# Patient Record
Sex: Female | Born: 1955 | Race: Black or African American | Hispanic: No | Marital: Single | Smoking: Former smoker
Health system: Southern US, Community
[De-identification: ages and names within clinical notes are randomized; demographics above are authoritative.]

## PROBLEM LIST (undated history)

## (undated) DIAGNOSIS — I6523 Occlusion and stenosis of bilateral carotid arteries: Secondary | ICD-10-CM

## (undated) DIAGNOSIS — M25512 Pain in left shoulder: Secondary | ICD-10-CM

## (undated) DIAGNOSIS — M1990 Unspecified osteoarthritis, unspecified site: Secondary | ICD-10-CM

## (undated) DIAGNOSIS — E785 Hyperlipidemia, unspecified: Secondary | ICD-10-CM

## (undated) DIAGNOSIS — K222 Esophageal obstruction: Secondary | ICD-10-CM

## (undated) DIAGNOSIS — R519 Headache, unspecified: Secondary | ICD-10-CM

## (undated) DIAGNOSIS — R7303 Prediabetes: Secondary | ICD-10-CM

## (undated) DIAGNOSIS — I714 Abdominal aortic aneurysm, without rupture, unspecified: Secondary | ICD-10-CM

## (undated) DIAGNOSIS — H269 Unspecified cataract: Secondary | ICD-10-CM

## (undated) DIAGNOSIS — G8929 Other chronic pain: Secondary | ICD-10-CM

## (undated) DIAGNOSIS — J302 Other seasonal allergic rhinitis: Secondary | ICD-10-CM

## (undated) DIAGNOSIS — I639 Cerebral infarction, unspecified: Secondary | ICD-10-CM

## (undated) DIAGNOSIS — F419 Anxiety disorder, unspecified: Secondary | ICD-10-CM

## (undated) DIAGNOSIS — M810 Age-related osteoporosis without current pathological fracture: Secondary | ICD-10-CM

## (undated) DIAGNOSIS — K449 Diaphragmatic hernia without obstruction or gangrene: Secondary | ICD-10-CM

## (undated) DIAGNOSIS — R51 Headache: Secondary | ICD-10-CM

## (undated) DIAGNOSIS — T7840XA Allergy, unspecified, initial encounter: Secondary | ICD-10-CM

## (undated) DIAGNOSIS — I10 Essential (primary) hypertension: Secondary | ICD-10-CM

## (undated) DIAGNOSIS — K760 Fatty (change of) liver, not elsewhere classified: Secondary | ICD-10-CM

## (undated) DIAGNOSIS — R251 Tremor, unspecified: Secondary | ICD-10-CM

## (undated) DIAGNOSIS — K219 Gastro-esophageal reflux disease without esophagitis: Secondary | ICD-10-CM

## (undated) HISTORY — DX: Abdominal aortic aneurysm, without rupture: I71.4

## (undated) HISTORY — DX: Hyperlipidemia, unspecified: E78.5

## (undated) HISTORY — DX: Diaphragmatic hernia without obstruction or gangrene: K44.9

## (undated) HISTORY — DX: Tremor, unspecified: R25.1

## (undated) HISTORY — DX: Allergy, unspecified, initial encounter: T78.40XA

## (undated) HISTORY — PX: EYE SURGERY: SHX253

## (undated) HISTORY — DX: Abdominal aortic aneurysm, without rupture, unspecified: I71.40

## (undated) HISTORY — DX: Other seasonal allergic rhinitis: J30.2

## (undated) HISTORY — DX: Unspecified cataract: H26.9

## (undated) HISTORY — DX: Gastro-esophageal reflux disease without esophagitis: K21.9

## (undated) HISTORY — PX: TUBAL LIGATION: SHX77

## (undated) HISTORY — DX: Age-related osteoporosis without current pathological fracture: M81.0

## (undated) HISTORY — PX: CHOLECYSTECTOMY: SHX55

## (undated) HISTORY — DX: Anxiety disorder, unspecified: F41.9

## (undated) HISTORY — DX: Occlusion and stenosis of bilateral carotid arteries: I65.23

## (undated) HISTORY — DX: Esophageal obstruction: K22.2

## (undated) HISTORY — PX: CATARACT EXTRACTION, BILATERAL: SHX1313

## (undated) HISTORY — DX: Prediabetes: R73.03

---

## 2000-08-18 ENCOUNTER — Ambulatory Visit (HOSPITAL_COMMUNITY): Admission: RE | Admit: 2000-08-18 | Discharge: 2000-08-18 | Payer: Self-pay | Admitting: Cardiology

## 2001-02-13 ENCOUNTER — Ambulatory Visit (HOSPITAL_COMMUNITY): Admission: RE | Admit: 2001-02-13 | Discharge: 2001-02-13 | Payer: Self-pay | Admitting: Family Medicine

## 2001-02-13 ENCOUNTER — Encounter: Payer: Self-pay | Admitting: Family Medicine

## 2001-03-11 ENCOUNTER — Emergency Department (HOSPITAL_COMMUNITY): Admission: EM | Admit: 2001-03-11 | Discharge: 2001-03-11 | Payer: Self-pay | Admitting: Emergency Medicine

## 2001-03-28 ENCOUNTER — Ambulatory Visit (HOSPITAL_COMMUNITY): Admission: RE | Admit: 2001-03-28 | Discharge: 2001-03-28 | Payer: Self-pay | Admitting: Otolaryngology

## 2001-03-28 ENCOUNTER — Encounter: Payer: Self-pay | Admitting: Otolaryngology

## 2001-05-21 ENCOUNTER — Observation Stay (HOSPITAL_COMMUNITY): Admission: EM | Admit: 2001-05-21 | Discharge: 2001-05-22 | Payer: Self-pay | Admitting: *Deleted

## 2001-05-25 ENCOUNTER — Ambulatory Visit (HOSPITAL_COMMUNITY): Admission: RE | Admit: 2001-05-25 | Discharge: 2001-05-25 | Payer: Self-pay | Admitting: *Deleted

## 2001-07-02 ENCOUNTER — Emergency Department (HOSPITAL_COMMUNITY): Admission: EM | Admit: 2001-07-02 | Discharge: 2001-07-02 | Payer: Self-pay | Admitting: *Deleted

## 2001-08-28 ENCOUNTER — Emergency Department (HOSPITAL_COMMUNITY): Admission: EM | Admit: 2001-08-28 | Discharge: 2001-08-28 | Payer: Self-pay | Admitting: Emergency Medicine

## 2001-09-11 ENCOUNTER — Ambulatory Visit (HOSPITAL_COMMUNITY): Admission: RE | Admit: 2001-09-11 | Discharge: 2001-09-11 | Payer: Self-pay | Admitting: Internal Medicine

## 2001-09-11 HISTORY — PX: ESOPHAGOGASTRODUODENOSCOPY: SHX1529

## 2002-01-24 ENCOUNTER — Emergency Department (HOSPITAL_COMMUNITY): Admission: EM | Admit: 2002-01-24 | Discharge: 2002-01-24 | Payer: Self-pay | Admitting: Emergency Medicine

## 2002-04-15 ENCOUNTER — Emergency Department (HOSPITAL_COMMUNITY): Admission: EM | Admit: 2002-04-15 | Discharge: 2002-04-15 | Payer: Self-pay | Admitting: Emergency Medicine

## 2002-12-07 ENCOUNTER — Encounter: Payer: Self-pay | Admitting: Family Medicine

## 2002-12-07 ENCOUNTER — Ambulatory Visit (HOSPITAL_COMMUNITY): Admission: RE | Admit: 2002-12-07 | Discharge: 2002-12-07 | Payer: Self-pay | Admitting: Family Medicine

## 2002-12-10 ENCOUNTER — Encounter: Payer: Self-pay | Admitting: Family Medicine

## 2002-12-10 ENCOUNTER — Ambulatory Visit (HOSPITAL_COMMUNITY): Admission: RE | Admit: 2002-12-10 | Discharge: 2002-12-10 | Payer: Self-pay | Admitting: Family Medicine

## 2003-04-15 ENCOUNTER — Ambulatory Visit (HOSPITAL_COMMUNITY): Admission: RE | Admit: 2003-04-15 | Discharge: 2003-04-15 | Payer: Self-pay | Admitting: Internal Medicine

## 2003-04-29 ENCOUNTER — Emergency Department (HOSPITAL_COMMUNITY): Admission: EM | Admit: 2003-04-29 | Discharge: 2003-04-29 | Payer: Self-pay | Admitting: Emergency Medicine

## 2003-05-11 HISTORY — PX: CARDIAC CATHETERIZATION: SHX172

## 2003-06-22 ENCOUNTER — Inpatient Hospital Stay (HOSPITAL_COMMUNITY): Admission: AD | Admit: 2003-06-22 | Discharge: 2003-06-24 | Payer: Self-pay | Admitting: *Deleted

## 2003-06-22 ENCOUNTER — Encounter: Payer: Self-pay | Admitting: Emergency Medicine

## 2003-08-08 ENCOUNTER — Ambulatory Visit (HOSPITAL_COMMUNITY): Admission: RE | Admit: 2003-08-08 | Discharge: 2003-08-08 | Payer: Self-pay | Admitting: Obstetrics and Gynecology

## 2003-08-30 ENCOUNTER — Other Ambulatory Visit: Admission: RE | Admit: 2003-08-30 | Discharge: 2003-08-30 | Payer: Self-pay | Admitting: Obstetrics and Gynecology

## 2004-02-08 ENCOUNTER — Emergency Department (HOSPITAL_COMMUNITY): Admission: EM | Admit: 2004-02-08 | Discharge: 2004-02-08 | Payer: Self-pay | Admitting: Emergency Medicine

## 2004-06-16 ENCOUNTER — Ambulatory Visit (HOSPITAL_COMMUNITY): Admission: RE | Admit: 2004-06-16 | Discharge: 2004-06-16 | Payer: Self-pay | Admitting: Internal Medicine

## 2004-06-16 ENCOUNTER — Ambulatory Visit: Payer: Self-pay | Admitting: Internal Medicine

## 2004-06-16 HISTORY — PX: COLONOSCOPY: SHX174

## 2004-06-22 ENCOUNTER — Ambulatory Visit: Payer: Self-pay | Admitting: Cardiology

## 2004-06-28 ENCOUNTER — Emergency Department (HOSPITAL_COMMUNITY): Admission: EM | Admit: 2004-06-28 | Discharge: 2004-06-29 | Payer: Self-pay | Admitting: *Deleted

## 2004-09-08 ENCOUNTER — Emergency Department (HOSPITAL_COMMUNITY): Admission: EM | Admit: 2004-09-08 | Discharge: 2004-09-08 | Payer: Self-pay | Admitting: *Deleted

## 2004-10-26 ENCOUNTER — Emergency Department (HOSPITAL_COMMUNITY): Admission: EM | Admit: 2004-10-26 | Discharge: 2004-10-26 | Payer: Self-pay | Admitting: *Deleted

## 2004-10-29 ENCOUNTER — Ambulatory Visit (HOSPITAL_COMMUNITY): Admission: RE | Admit: 2004-10-29 | Discharge: 2004-10-29 | Payer: Self-pay | Admitting: Family Medicine

## 2004-10-30 ENCOUNTER — Emergency Department (HOSPITAL_COMMUNITY): Admission: EM | Admit: 2004-10-30 | Discharge: 2004-10-30 | Payer: Self-pay | Admitting: Emergency Medicine

## 2005-06-02 ENCOUNTER — Ambulatory Visit (HOSPITAL_COMMUNITY): Admission: RE | Admit: 2005-06-02 | Discharge: 2005-06-02 | Payer: Self-pay | Admitting: Internal Medicine

## 2005-09-04 ENCOUNTER — Observation Stay (HOSPITAL_COMMUNITY): Admission: EM | Admit: 2005-09-04 | Discharge: 2005-09-05 | Payer: Self-pay | Admitting: Emergency Medicine

## 2006-01-11 ENCOUNTER — Emergency Department (HOSPITAL_COMMUNITY): Admission: EM | Admit: 2006-01-11 | Discharge: 2006-01-11 | Payer: Self-pay | Admitting: Emergency Medicine

## 2007-04-26 ENCOUNTER — Ambulatory Visit (HOSPITAL_COMMUNITY): Admission: RE | Admit: 2007-04-26 | Discharge: 2007-04-26 | Payer: Self-pay | Admitting: Family Medicine

## 2008-01-03 ENCOUNTER — Ambulatory Visit (HOSPITAL_COMMUNITY): Admission: RE | Admit: 2008-01-03 | Discharge: 2008-01-03 | Payer: Self-pay | Admitting: Family Medicine

## 2008-05-24 ENCOUNTER — Ambulatory Visit (HOSPITAL_COMMUNITY): Admission: RE | Admit: 2008-05-24 | Discharge: 2008-05-24 | Payer: Self-pay | Admitting: Internal Medicine

## 2010-05-09 ENCOUNTER — Emergency Department (HOSPITAL_COMMUNITY)
Admission: EM | Admit: 2010-05-09 | Discharge: 2010-05-09 | Payer: Self-pay | Source: Home / Self Care | Admitting: Emergency Medicine

## 2010-07-20 LAB — URINALYSIS, ROUTINE W REFLEX MICROSCOPIC
Bilirubin Urine: NEGATIVE
Glucose, UA: NEGATIVE mg/dL
Ketones, ur: NEGATIVE mg/dL
Nitrite: NEGATIVE
Protein, ur: NEGATIVE mg/dL
Specific Gravity, Urine: <1.005 — ABNORMAL LOW (ref 1.005–1.030)
Urobilinogen, UA: 0.2 mg/dL (ref 0.0–1.0)
pH: 6 (ref 5.0–8.0)

## 2010-07-20 LAB — URINE MICROSCOPIC-ADD ON

## 2010-07-20 LAB — URINE CULTURE
Colony Count: >=100000
Culture  Setup Time: 201112311954

## 2010-09-25 NOTE — H&P (Signed)
[facility]  Patient:    [name], [name] Visit Number: [id number] MRN: [id number]          Service Type: OUT Location: RAD Attending Physician:  Titus Mould Dictated by:   Karleen Hampshire, M.D. Admit Date:  03/28/2001 Discharge Date: 03/28/2001                           History and Physical  CHIEF COMPLAINT:  Chest pain.  HISTORY OF PRESENT ILLNESS:  This is a 55 year old black female who presented to the emergency room on the morning of admission after working all night, complaining of some pain in her left anterior chest and also in the left shoulder blade.  The patient states she had the pain off and on for approximately 12 hours.  In the ER cardiac enzymes were negative, and EKG looked normal.  There is a strong family history of heart disease, both parents died of heart disease, she has hypercholesterolemia and severe hypertension, and she smokes.  The patient states she has a normal stress test by Dr. Juanito Doom in August 2001.  The patient also states she felt somewhat nauseated and felt like she was going to break out in a sweat associated with the pain.  She is admitted for rule out MI.  ALLERGIES:  She is allergic to no medications.  MEDICATIONS:   She currently takes: 1. Accupril 40 mg, 2 daily. 2. Ziac 2.5 daily. 3. Clonidine 0.2 b.i.d. 4. Zocor 20 mg daily. 5. Aspirin. 6. Aciphex. 7. Xanax.  PAST MEDICAL HISTORY: 1. Status post cholecystectomy. 2. History of GERD.  REVIEW OF SYSTEMS:  Noncontributory.  PHYSICAL EXAMINATION:  GENERAL:  Well-developed black female in no severe distress.  VITAL SIGNS:  Afebrile, pulse 70 and regular, respirations 20 and unlabored, blood pressure 160/75.  HEENT:  TMs are normal.  Pupils are equal to light and accommodation. Oropharynx is benign.  NECK:  Supple.  Without JVD, bruit, or thyromegaly.  LUNGS:  Clear in all areas.  HEART:  Regular sinus rhythm.  Without murmur,  gallop, or rub.  ABDOMEN:  Soft and nontender.  EXTREMITIES:  Without clubbing, cyanosis, or edema.  NEUROLOGIC:  Grossly intact.  ASSESSMENT: 1. Chest pain with multiple risk factors, rule out myocardial infarction. 2. History of hypertension. 3. History of gastroesophageal reflux disease. 4. Status post cholecystectomy. 5. Hyperlipidemia. 6. Family history of heart disease. 7. Smoker. Dictated by:   Karleen Hampshire, M.D. Attending Physician:  Titus Mould DD:  05/21/01 TD:  05/21/01 Job: [id number]

## 2010-09-25 NOTE — Cardiovascular Report (Signed)
NAME:  [name], [name]                      ACCOUNT NO.:  [id number]   MEDICAL RECORD NO.:  [id number]                   PATIENT TYPE:  INP   LOCATION:  2040                                 FACILITY:  [facility]   PHYSICIAN:  Nanetta Batty, M.D.                DATE OF BIRTH:  1956/05/03   DATE OF PROCEDURE:  06/24/2003  DATE OF DISCHARGE:                              CARDIAC CATHETERIZATION   INDICATIONS:  [name] is a 55 year old black female with positive risk  factors transferred from [facility] on February 12 because of  unstable angina.  She ruled out for myocardial infarction.  She had no acute  EKG changes.  She was treated with Lovenox.  She was cathed May 25, 2001  and was found to have clean coronaries.  She presents now for diagnostic  coronary arteriography.   PROCEDURE DESCRIPTION:  The patient was brought to the second floor [facility] in the postabsorptive state.  She was not  premedication.  Her right groin was prepped and shaved in the usual sterile  fashion.  1% Xylocaine was used for local anesthesia.  A 6 French sheath was  inserted into the right femoral artery using standard Seldinger technique. A  6 French right and left Judkins diagnostic catheter as well as 6 French  pigtail catheter were used for selective coronary angiography and left  ventriculography respectively.  A 6 French JL-3.0 guide catheter was used to  intubate the left coronary.  Omnipaque dye was used for the entirety of the  case. Retrograde aortic, ventricular pullback pressures were recorded.   HEMODYNAMICS:  1. Aortic systolic pressure 179, diastolic pressure 65.  2. Left ventricular systolic pressure 174, end-diastolic pressure 21.   SELECTIVE CORONARY ANGIOGRAPHY:  1. Left main normal.  2. LAD normal.  3. Left circumflex normal.  4. Right coronary is dominant and normal.   LEFT VENTRICULOGRAPHY:  RAO left ventriculogram was performed  using 25 mL of  Omnipaque dye at 12 mL per second.  The overall LVEF is estimated at greater  than 60% without focal wall motion abnormalities.   IMPRESSION:  [name] has essentially normal coronary arteries and  normal left ventricular function.  She will be treated empirically with anti-  reflux measures, discharge home later today and will be seen back by Dr.  Nobie Putnam for followup.  Sheaths were pulled and pressure was held to the  groin to achieve hemostasis.                                               Nanetta Batty, M.D.    JB/MEDQ  D:  06/24/2003  T:  06/24/2003  Job:  [id number]   cc:   [facility]   Patrica Duel, M.D.  [address], [address]  [place]  [place] [address]  Fax: [phone]

## 2010-09-25 NOTE — Discharge Summary (Signed)
[name], ROMBERGER            ACCOUNT NO.:  [id number]   MEDICAL RECORD NO.:  [id number]          PATIENT TYPE:  INP   LOCATION:  5506                         FACILITY:  [facility]   PHYSICIAN:  Deirdre Peer. Polite, M.D. DATE OF BIRTH:  09-12-1955   DATE OF ADMISSION:  09/04/2005  DATE OF DISCHARGE:  09/05/2005                                 DISCHARGE SUMMARY   DISCHARGE DIAGNOSES:  1.  Newly diagnosed diabetes, hemoglobin A1c 10.8.  Discharged on glyburide      5 b.i.d. as well as Lantus 16 units daily.  2.  Steroid use for unknown reason.  Patient to follow up with primary      physician on September 06, 2005, for further information regarding the use      of steroids.  3.  Hypertension, well controlled.  4.  High cholesterol, well controlled.  Total cholesterol 205, LDL 108, HDL      69.  5.  Diarrhea, unknown etiology, Clostridium difficile pending.  Currently      appears to be resolved.  Patient is afebrile with normal abdominal      series and desiring to be discharged home.  6.  Confusional state, resolved secondary to hyperglycemia.  7.  Gastroesophageal reflux disease, stable.   DISCHARGE MEDICATIONS:  1.  Lantus 16 units daily.  2.  Glyburide 5 mg q.12h.  3.  Patient is asked to decrease prednisone to 10 mg two times a day and      follow up with her primary M.D. for further titrations of that      medication and to these worsening diagnosis symptoms.  4.  Patient is also asked to hold hydrochlorothiazide.  5.  Clonidine 0.2 mg p.o. t.i.d.  6.  Bisoprolol 25 mg daily.  7.  Quinapril 40 mg daily.  8.  Fexofenadine 180 mg daily.  9.  Nexium 40 mg b.i.d.  10. Alprazolam 1 mg p.o. p.r.n.  11. Vytorin 10/20 daily.  12. Aspirin 81 mg daily.   DISPOSITION:  Patient is being discharge home in stable condition, asked to  follow up with primary M.D. in one day.   CONSULTATIONS:  None.   STUDIES:  Patient  had abdominal series within normal limits.   Discharge BMET with  potassium 3.3.  Thyroidal studies within normal limits.  Cortisol level 15. Hemoglobin A1c 10.8.  Total cholesterol 205, LDL 103, HDL  69.   Chest x-ray with no apparent disease.   HISTORY OF PRESENT ILLNESS:  A 55 year old female with the above medical  problems presented to the ED with complaints of blurred vision, polyuria,  polydipsia.  In the ED, the patient was evaluated and found to be  hyperglycemic.  Admission was deemed necessary for further treatment of new  onset diabetes.  Of note, the patient had been on prednisone for at least a  month and she is not exactly certain what she is taking this for.  This is  more than likely the cause of her diabetes to be worsened.  As stated,  admission deemed necessary for further evaluation and treatment.  Please see  dictated  H&P for further details.   PAST MEDICAL HISTORY:  As stated above.   MEDICATIONS ON ADMISSION:  As stated above and per admission H&P.   SOCIAL HISTORY:  Negative for tobacco, quit about a year ago.  No alcohol.  No drugs.   FAMILY HISTORY:  Per admission H&P.   HOSPITAL COURSE:  Patient admitted to a medicine floor bed for evaluation,  treatment of new onset diabetes.  Patient was initially treated with IV  insulin and IV fluids.  Patient had screening labs.  Thyroid studies within  normal limits, A1c and lipid panel as stated above.  Patient's sugars  trended to normal range.  Glucommander was stopped.  Patient was continued  on IV fluids as above but she needed more IV fluids up front as compared to  ____.  Patient did not have any acidosis nor azotemia.  Patient did have  some hypokalemia which has been repleted.  _____ diabetes  no complications.  Patient stated she was borderline, however, A1c is 10.8, suggesting that the  patient has been diabetic for some time.  More than likely, the initiation  of steroidal treatment for unknown cause is exacerbating her symptoms. The  patient is asked to discuss with  her primary MD now to proceed with her  prednisone as it is worsening her diabetes. At this time, I have explained  to the patient that she definitively has diabetes with A1c of 10.8, will  need Lantus as well as oral medication which may need to be further titrated  as an outpatient basis.  She has been informed to follow up with her primary  MD on September 06, 2005, for further instructions.   STEROID USE, UNKNOWN REASON WHY:  Patient has been asked to contact her  primary MD _____ .  Patient will continue on a decreased dose and asked to  follow up with her primary MD on September 06, 2005, for further advise.   NAUSEA AND VOMITING X1:  Rule out colonic gastroparesis.  Abdominal series  has been ordered which was within normal limits.  Patient is without any  symptoms at this time.  Patient also had diarrhea which appears to have  resolved. C. difficile has been ordered.  Results are pending.  However,  patient requests to be discharged to home with further outpatient follow-up.   Patient has several other chronic medical problems which are currently  stable at this time; therefore is thought to be stable for discharge.   LOW GRADE TEMPERATURE:  Resolved.  No infectious etiology has been  identified at this time.  Has been followed up clinically. Patient also had  a bout of leukocytosis which has been resolved.  Could be _____ diagnosed  diabetes.  Please note patient's chest x-ray is negative and abdominal  series is negative.  Blood culture have been  ordered but are pending.  Patient has history of sinus problems, on  medications for that but did not have any infection at this time, however,  _____ primary MD in the a.m. for further medical management.  Patient is  felt to be stable for discharge.      Deirdre Peer. Polite, M.D.  Electronically Signed     RDP/MEDQ  D:  09/05/2005  T:  09/06/2005  Job:  [id number]

## 2010-09-25 NOTE — Cardiovascular Report (Signed)
[place]  Patient:    [name], [name] Visit Number: [id number] MRN: [id number]          Service Type: CAT Location: [facility] 2864 [id number] Attending Physician:  Veneda Melter Dictated by:   Veneda Melter, M.D. [facility] Proc. Date: 05/25/01 Admit Date:  05/25/2001   CC:         [name], M.D. [facility]  Jonell Cluck, M.D.   Cardiac Catheterization  PROCEDURES PERFORMED: 1. Left heart catheterization. 2. Left ventriculogram. 3. Selective coronary angiography. 4. Perclose, right femoral artery.  DIAGNOSES: 1. Mild coronary artery disease by angiogram. 2. Normal left ventricular systolic function.  HISTORY:  [name] is a 55 year old black female who presents with substernal chest discomfort.  The patient has undergone stress imaging study over one year ago which suggested no ischemia.  Unfortunately, she has persisted in having chest discomfort and she presents now for cardiac catheterization.  DESCRIPTION OF PROCEDURE:  Informed consent was obtained.  The patient was brought to the catheterization lab.  A #6 French sheath was placed in the right femoral artery.  Left heart catheterization, selective coronary angiography, and left ventriculogram were then performed using preformed #6 French Judkins catheters.  A JL3.5 catheter was used to engage the left coronary artery.  At the termination of the case, a Perclose suture closure device was deployed to the right femoral artery.  Adequate hemostasis was achieved and the patient was transferred to the holding area in stable condition.  Findings are as follows:  FINDINGS: 1. Left main trunk:  Mild irregularities. 2. LAD:  This is a medium caliber vessel that provides three small diagonal    branches.  There are mild irregularities of not greater than 20% in the mid    LAD. 3. Left circumflex artery:  This is a small caliber vessel that provides three    small marginal branches.  There are  mild irregularities in the left    circumflex system. 4. Right coronary artery:  Dominant.  This is a small caliber vessel that    provides a small posterior descending artery and three small posterior    ventricular branches in its terminal segment.  There is mild disease of    30% in the distal branch vessels.  No critical stenosis noted.  LV:  Normal end systolic and end diastolic dimensions.  Overall left ventricular function is well preserved.  Ejection fraction of greater than 55%.  No mitral regurgitation.  LV pressure 135/0.  AO 135/60.  LVEDP equals 15.  ASSESSMENT AND PLAN:  [name] is a 55 year old female with mild coronary artery disease and well-preserved LV function.  Continued risk factor modification will be pursued and other causes of chest pain investigated. Dictated by:   Veneda Melter, M.D. [facility] Attending Physician:  Veneda Melter DD:  05/25/01 TD:  05/25/01 Job: [id number]

## 2010-09-25 NOTE — Discharge Summary (Signed)
NAME:  [name], [name]                      ACCOUNT NO.:  [id number]   MEDICAL RECORD NO.:  [id number]                   PATIENT TYPE:  INP   LOCATION:  2040                                 FACILITY:  [facility]   PHYSICIAN:  Darlin Priestly, M.D.             DATE OF BIRTH:  03-28-1956   DATE OF ADMISSION:  DATE OF DISCHARGE:  06/24/2003                                 DISCHARGE SUMMARY   DISCHARGE DIAGNOSES:  1. Chest pain, rule out for myocardial infarction by serial enzymes.  2. Status post cardiac catheterization with clear findings.  3. Possible gastroesophageal reflux disease responsible for the etiology of     bilateral chest pain.  4. Hypertension.  5. Hyperlipidemia.  6. Tobacco use.  7. Positive family history of coronary artery disease.   HOSPITAL COURSE:  This is a 55 year old African American female with a prior  history of hyperlipidemia, hypertension, positive family history of coronary  artery disease and ongoing tobacco use who presented to the emergency room  of [facility] with complaints of substernal chest pain, __________  quality, onset of pain in the evening of June 21, 2003.  The pain was  off and on and radiated to the left neck and left arm and also accompanied  by shortness of breath.  The patient was seen and evaluated at the [facility] and transferred to [facility] for in depth cardiology  evaluation.   She was admitted to [facility] telemetry to rule out myocardial  infarction protocol, on IV nitroglycerin and heparin.   HOSPITAL PROCEDURES:  Cardiac catheterization performed by Dr. Allyson Sabal on  July 04, 2003.  This catheterization revealed normal coronaries and  normal left ventricular function. The plan was to discharge the patient home  today after bed rest expires and if she is stable post ambulation, with  follow up with her primary physician to address chest pain, likely related  to gastroesophageal  reflux disease.   During this admission, she was noticed to have low serum potassium which was  repleted.   The patient tolerated the procedure well, and was transferred back to the  unit in stable condition.   LABORATORY DATA:  EKG did not show any acute changes.  CBC showed a  hemoglobin of 14, hematocrit 41.6.  White blood cell count 8.5.  Platelets  276,000.  Coagulations studies were normal.  INR 0.9.  Prothrombin time  12.7.  Sodium 137, potassium 3.3 which was repleted.  Chloride 102, cO2 29,  BUN 10, creatinine 0.9.  Glucose 103.   Lipid profile showed total cholesterol 161, triglycerides  111, HDL 46, and  LDL 83.   The patient was encouraged to quit smoking, we also increased her Ziac dose  from 5 to 10 mg daily, and this was due to her elevated blood pressure.   RECOMMENDATIONS:  She is to follow up with Dr. Nobie Putnam as an outpatient to  address her symptoms, her chest discomfort probably related to  gastroesophageal reflux disease and possibly consider EGD evaluation to rule  out peptic ulcer.   DISCHARGE ACTIVITIES:  1. No lifting greater than 5 pounds, no strenuous activity, no driving for     three days post catheterization.  2. Note to excuse the patient from work from February 15 through June 28, 2003, was given to the patient.   DISCHARGE DIET:  Low-fat, low-cholesterol diet.   DISCHARGE MEDICATIONS:  1. Crestor 40 mg daily.  2. Aspirin 81 mg daily.  3. Clonidine 0.2 mg b.i.d.  4. Aspirin 81 mg daily.  5. Quinapril 40 mg daily.  6. Nexium 40 mg daily.  7. Hydrochlorothiazide 12.5 mg daily.  8. Ziac, 10/6.25 mg daily.   FOLLOWUP:  The patient is to follow up with Dr. Patrica Duel as an  outpatient.      Raymon Mutton, P.A.                    Darlin Priestly, M.D.    MK/MEDQ  D:  06/24/2003  T:  06/24/2003  Job:  [id number]   cc:   Patrica Duel, M.D.  [address], [address]  [place]  [place] [address]  Fax: [phone]   Darlin Priestly, M.D.  [phone] [address]  [place]  [place] [address]  Fax: [phone]

## 2010-09-25 NOTE — Op Note (Signed)
[facility]  Patient:    JAXON, MYNHIER Visit Number: [id number] MRN: [id number]          Service Type: DSU Location: DAY Attending Physician:  Jonathon Bellows Dictated by:   Roetta Sessions, M.D. Proc. Date: 09/09/01 Admit Date:  09/11/2001 Discharge Date: 09/11/2001   CC:         Jonell Cluck, M.D.   Operative Report  PROCEDURE:  Esophagogastroduodenoscopy with [facility] dilation.  INDICATIONS FOR PROCEDURE:  The patient is a 54 year old lady with chronic GERD and several month history of esophageal dysphagia to solids. EGD is now being done to further evaluate her symptoms. This approach has been discussed with Ms. Sachdev at the bedside. Potential risks, benefits, and alternatives have been reviewed. Please see my handwritten H&P for more information.  MONITORING:  O2 saturations, blood pressure, pulse and respirations monitored throughout the entire procedure.  CONSCIOUS SEDATION:  Versed 5 mg IV, Demerol 100 mg IV in divided doses. Cetacaine spray for topical oropharyngeal anesthesia.  INSTRUMENT:  Olympus video chip gastroscope.  FINDINGS:  Examination of the tubular esophagus revealed a distal esophageal ring . The esophageal mucosa otherwise appeared normal. The EG was easily traversed.  STOMACH:  The gastric cavity was empty and insufflated well with air. A thorough examination of the gastric mucosa including a retroflexed view of the proximal stomach and esophagogastric junction demonstrated no abnormalities. The pylorus was patent and easily traversed.  DUODENUM:  The bulb and second portion appeared normal.  THERAPEUTIC/DIAGNOSTIC MANEUVERS PERFORMED:  A 56 French Maloney dilator was passed to full insertion with good patient tolerance. A look back revealed some blood at the EG junction. The ring appeared to have been ruptured without apparent complications.  The patient tolerated the procedure very well and was  reactive in endoscopy.  IMPRESSION:  1. Schatzkis ring otherwise normal esophagus.  2. Normal stomach.  3. Normal D1 and D2 status post passage of a 56 [place] Maloney dilator.  RECOMMENDATIONS:  1. Aciphex 20 mg orally daily for gastroesophageal reflux disease     ______ reflux measures emphasized.  2. Follow-up with Dr. Nobie Putnam.  3. Should she have any future problems with swallowing, she is to let me     know. Dictated by:   Roetta Sessions, M.D. Attending Physician:  Jonathon Bellows DD:  09/11/01 TD:  09/12/01 Job: [id number]

## 2010-09-25 NOTE — Op Note (Signed)
NAME:  [name], [name]            ACCOUNT NO.:  [id number]   MEDICAL RECORD NO.:  [id number]          PATIENT TYPE:  AMB   LOCATION:  DAY                           FACILITY:  [facility]   PHYSICIAN:  R. Roetta Sessions, M.D. DATE OF BIRTH:  10-Jul-1955   DATE OF PROCEDURE:  06/16/2004  DATE OF DISCHARGE:                                 OPERATIVE REPORT   REFERRING PHYSICIAN:  Patrica Duel, M.D.   PROCEDURES:  Colonoscopy with biopsy.   INDICATIONS FOR PROCEDURE:  The patient is a 55 year old lady who comes for  colorectal cancer screening.  She is referred by Dr. Nobie Putnam.  She is  devoid of _any  gi symptoms.  No family history of colorectal neoplasia.  She has never had  a colonoscopy.  Colonoscopy is now being done as a screening maneuver.  The  approach has been discussed with the patient at length.  The potential  risks, benefits and alternatives have been reviewed and questions answered.  She is agreeable.  Please see documentation in the medical record.   PROCEDURE NOTE:  O2 saturation, blood pressure, pulse and respirations were  monitored throughout the entire procedure.  Conscious sedation with Versed 4  mg IV and Demerol 100 mg IV in divided doses.   INSTRUMENT:  Olympus video chip system.   FINDINGS:  Digital rectal exam revealed no abnormalities.  Endoscopic prep  was adequate.   Rectum:  Examination of the rectal mucosa, including retroflexed view of the  anal verge revealed no abnormalities.   Colon:  The colonic mucosa was surveyed from the rectosigmoid junction  through the left transverse and right colon to the appendiceal orifices,  ileocecal valve and cecum.  These structures were well seen and photographed  for the record.  From this level, the scope was slowly withdrawn.  All  previously mentioned mucosal surfaces were again seen.  The patient had a  diminutive 3 mm polyp at the base of the cecum, which was cold  biopsied/removed.  There was a second similar  sized polyp at the splenic  flexure which was dealt with in the same manner.  The remainder of the  colonic mucosa appeared normal.  The patient tolerated the procedure well  and was reactive after endoscopy.   IMPRESSION:  1.  Normal rectum.  2.  Diminutive polyps of the splenic flexure and the cecum, cold      biopsied/removed.  3.  The remainder of the colonic mucosa appeared normal.   RECOMMENDATIONS:  1.  Follow up on pathology.  2.  Further recommendations to follow.      RMR/MEDQ  D:  06/16/2004  T:  06/16/2004  Job:  [id number]   cc:   Patrica Duel, M.D.  [address], [address]  [place]  [place] [address]  Fax: [phone]

## 2010-09-25 NOTE — H&P (Signed)
NAMECHRISTAL, LAGERSTROM            ACCOUNT NO.:  [id number]   MEDICAL RECORD NO.:  [id number]          PATIENT TYPE:  INP   LOCATION:  5506                         FACILITY:  [facility]   PHYSICIAN:  Deirdre Peer. Polite, M.D. DATE OF BIRTH:  09-Apr-1956   DATE OF ADMISSION:  09/04/2005  DATE OF DISCHARGE:                                HISTORY & PHYSICAL   CHIEF COMPLAINT:  Blurred vision.   PRIMARY CARE PHYSICIAN:  Patrica Duel, M.D.   HISTORY OF THE PRESENT ILLNESS:  The patient is a 55 year old African-  American female with a past medical history of hypertension, cholesterol and  GERD, and possibly esophageal dysmotility who presents with a two-week  history of blurred vision, headaches and muscle cramps.  The patient  evidently had been started one month ago on prednisone for an unclear  diagnosis at this time, but she states it is for her glands, and I suspect  it is related to her thyroid.  The patient can give me no further  information related to why Dr. Patrecia Pace may have started her on prednisone.  The patient states that two weeks ago she developed the onset of blurring  vision, polyuria and polydipsia.  She has a doctor's appointment, but it is  not until late next week or the following week.   The patient came to the emergency room today when she developed tingling in  her fingers, worsening blurred vision and muscle cramping.  She also had an  occasional onset of dyspnea, which has been intermittent.   REVIEW OF SYSTEMS:  At this time she is not short of breath.  She denies  chest pain.  She recently has been having continued runny nose and seems to  have a chronic upper respiratory tract infection.  She denies dysuria, chest  pain, shortness of breath, PND, or orthopnea.  The remainder of the review  of systems is negative.   PAST MEDICAL HISTORY:  1.  Hypertension.  2.  A glandular problem, which I presume is thyroid,  3.  Hypercholesterolemia.  4.  Gastroesophageal  reflux disease with potential for esophageal      dysmotility based on a previous barium swallow.  The patient had a      cardiac catheterization in 2005, which is when she was diagnosed with      severe reflux.  The cath was negative, but her presenting symptoms was      chest pain.   PAST SURGICAL HISTORY:  1.  The patient had a cholecystectomy.  2.  The patient also had a tubal ligation.   SOCIAL HISTORY:  The patient denies tobacco; she states she quite last year.  She denies ethanol or illicit drug use. She works in [employer].   FAMILY HISTORY:  Mother is deceased with hypertension and diabetes.  Father  is deceased with hypertension.   ALLERGIES:  No known drug allergies.   MEDICATIONS:  The patient's medications for the last one month:  1.  The patient has been using prednisone 20 mg twice a day as prescribed by      Dr. Horton Chin.  2.  Hydrochlorothiazide 12.5 once daily.  3.  Clonidine 0.2 mg three times a day.  4.  bisoprolol/hydrochlorothiazide 2.5 mg/6.25 mg.  5.  Quinapril 40 mg once daily.  6.  Fexofenodine 180 mg once daily.  7.  Nexium 40 mg twice a day.  8.  Alprazolam 1 mg as needed. The patient takes at about half-a-tablet a      week.  9.  Vytorin 10/20 once daily.  10. Aspirin 81 mg once daily.   LABORATORY DATA:  Sodium 132, potassium 4.1, chloride 100, CO2 28, BUN 22,  creatinine 1.2 with a gap of 4, and glucose 590.  LFTs show an elevation in  the ALT slightly at 48.  Point of care enzymes are negative.  White count  11.5, hemoglobin 14.3, hematocrit 42.0 and platelets 253,000.   ASSESSMENT AND PLAN:  This is a 55 year old African-American female with a  past medical history of hypertension and hyperlipidemia who has been on  prednisone times one month for a glandular problem.  She thinks that she  did have the prescription refilled and so this will be going on her second  month of treatment.  The patient presents with blurred vision,  polyuria,  polydipsia and was discovered to have newly diagnosed diabetes.   1.  Cardiovascular.  The patient has occasional shortness of breath, but      denies chest pain.  She recently had a cardiac catheterization, which      was within normal limits.  Then we will continue her      hydrochlorothiazide, clonidine, bisoprolol, and quinapril.  We will      check a lipid panel.   1.  Pulmonary. The patient had distant chest pain, but her chest x-ray      showed no acute disease.   1.  Gastrointestinal.  This is deferred with a question of esophageal      dysmotility.  The patient had a barium swallow some years back, which      showed possible dysmotility disease.  We will continue her Nexium twice      a day.   1.  Endocrine.  The patient has been on prednisone for her glands times      one month.  The question is whether this is thyroid disease.  We will      try to check with Dr. Patrecia Pace in the morning.  The patient will likely      need to be weaned off her steroid.  We will resume it at 10 milligrams      per os twice a day; and, if her blood sugars continue to be elevated we      may have to hold the 10 milligrams after being on a month of medication      as she may have some withdrawals.  So, at this point I will start her      back on the prednisone 10 milligrams per os twice a day and use insulin      to get her under control.  In light of the fact that she is being      treated for her glands we will check a thyroid stimulating hormone, T3      and free T4.   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  We will continue her on glucomander with hydration, check a hemoglobin A-1-C  and determine whether or not the patient needs oral agents or insulin for  home.   1.  Genitourinary.  We will check a urinalysis, and culture and sensitivity      in light of her low-grade temperature.  1.  Deep venous thrombosis prophylaxis.  This will be done  with Lovenox.   Of note, I suspect that she does have an acute on chronic sinusitis, which  may account for the low-grade fever.  We will therefore start her on some  Avelox  400 mg intravenously daily and follow her symptomatically.      Melissa L. Ladona Ridgel, MD  Electronically Signed      Deirdre Peer. Polite, M.D.  Electronically Signed    MLT/MEDQ  D:  09/04/2005  T:  09/04/2005  Job:  [id number]   cc:   Patrica Duel, M.D.  Fax: [phone]

## 2011-06-25 ENCOUNTER — Other Ambulatory Visit (HOSPITAL_COMMUNITY): Payer: Self-pay | Admitting: Family Medicine

## 2011-06-25 DIAGNOSIS — Z139 Encounter for screening, unspecified: Secondary | ICD-10-CM

## 2011-06-28 ENCOUNTER — Ambulatory Visit (HOSPITAL_COMMUNITY)
Admission: RE | Admit: 2011-06-28 | Discharge: 2011-06-28 | Disposition: A | Discharge status: Home / Self Care | Payer: BC Managed Care – PPO | Source: Ambulatory Visit | Attending: Family Medicine | Admitting: Family Medicine

## 2011-06-28 DIAGNOSIS — Z1231 Encounter for screening mammogram for malignant neoplasm of breast: Secondary | ICD-10-CM | POA: Insufficient documentation

## 2011-06-28 DIAGNOSIS — Z139 Encounter for screening, unspecified: Secondary | ICD-10-CM

## 2011-09-17 ENCOUNTER — Other Ambulatory Visit (HOSPITAL_COMMUNITY): Payer: Self-pay | Admitting: Family Medicine

## 2011-09-17 ENCOUNTER — Ambulatory Visit (HOSPITAL_COMMUNITY)
Admission: RE | Admit: 2011-09-17 | Discharge: 2011-09-17 | Disposition: A | Discharge status: Home / Self Care | Payer: BC Managed Care – PPO | Source: Ambulatory Visit | Attending: Family Medicine | Admitting: Family Medicine

## 2011-09-17 DIAGNOSIS — R1013 Epigastric pain: Secondary | ICD-10-CM

## 2011-09-17 DIAGNOSIS — R1011 Right upper quadrant pain: Secondary | ICD-10-CM | POA: Insufficient documentation

## 2011-12-27 ENCOUNTER — Emergency Department (HOSPITAL_COMMUNITY)
Admission: EM | Admit: 2011-12-27 | Discharge: 2011-12-27 | Disposition: A | Discharge status: Home / Self Care | Payer: BC Managed Care – PPO | Attending: Emergency Medicine | Admitting: Emergency Medicine

## 2011-12-27 ENCOUNTER — Encounter (HOSPITAL_COMMUNITY): Payer: Self-pay | Admitting: *Deleted

## 2011-12-27 DIAGNOSIS — L259 Unspecified contact dermatitis, unspecified cause: Secondary | ICD-10-CM | POA: Insufficient documentation

## 2011-12-27 DIAGNOSIS — L309 Dermatitis, unspecified: Secondary | ICD-10-CM

## 2011-12-27 DIAGNOSIS — I10 Essential (primary) hypertension: Secondary | ICD-10-CM | POA: Insufficient documentation

## 2011-12-27 DIAGNOSIS — Z881 Allergy status to other antibiotic agents status: Secondary | ICD-10-CM | POA: Insufficient documentation

## 2011-12-27 HISTORY — DX: Essential (primary) hypertension: I10

## 2011-12-27 MED ORDER — DEXAMETHASONE SODIUM PHOSPHATE 10 MG/ML IJ SOLN
INTRAMUSCULAR | Status: AC
  Administered 2011-12-27: 10 mg via INTRAMUSCULAR
  Filled 2011-12-27: qty 1

## 2011-12-27 MED ORDER — HYDROXYZINE HCL 25 MG PO TABS: 25.0000 mg | ORAL_TABLET | Freq: Four times a day (QID) | ORAL | Status: AC

## 2011-12-27 MED ORDER — DEXAMETHASONE SODIUM PHOSPHATE 4 MG/ML IJ SOLN: 10.0000 mg | Freq: Once | INTRAMUSCULAR | Status: AC

## 2011-12-27 MED ORDER — PREDNISONE 10 MG PO TABS: ORAL_TABLET | ORAL | Status: DC

## 2011-12-27 NOTE — ED Notes (Signed)
Itching rash to upper chest, neck and upper back.  X 1 week.

## 2011-12-31 ENCOUNTER — Emergency Department (HOSPITAL_COMMUNITY): Payer: BC Managed Care – PPO

## 2011-12-31 ENCOUNTER — Other Ambulatory Visit: Payer: Self-pay

## 2011-12-31 ENCOUNTER — Observation Stay (HOSPITAL_COMMUNITY)
Admission: EM | Admit: 2011-12-31 | Discharge: 2012-01-01 | Disposition: A | Discharge status: Home / Self Care | Payer: BC Managed Care – PPO | Attending: Emergency Medicine | Admitting: Emergency Medicine

## 2011-12-31 ENCOUNTER — Encounter (HOSPITAL_COMMUNITY): Payer: Self-pay | Admitting: Emergency Medicine

## 2011-12-31 DIAGNOSIS — R0602 Shortness of breath: Secondary | ICD-10-CM | POA: Insufficient documentation

## 2011-12-31 DIAGNOSIS — E876 Hypokalemia: Secondary | ICD-10-CM

## 2011-12-31 DIAGNOSIS — R21 Rash and other nonspecific skin eruption: Secondary | ICD-10-CM | POA: Insufficient documentation

## 2011-12-31 DIAGNOSIS — R61 Generalized hyperhidrosis: Secondary | ICD-10-CM | POA: Insufficient documentation

## 2011-12-31 DIAGNOSIS — K5900 Constipation, unspecified: Secondary | ICD-10-CM | POA: Insufficient documentation

## 2011-12-31 DIAGNOSIS — R55 Syncope and collapse: Principal | ICD-10-CM | POA: Insufficient documentation

## 2011-12-31 DIAGNOSIS — R5381 Other malaise: Secondary | ICD-10-CM | POA: Insufficient documentation

## 2011-12-31 LAB — COMPREHENSIVE METABOLIC PANEL
ALT: 14 U/L (ref 0–35)
AST: 15 U/L (ref 0–37)
Albumin: 3.9 g/dL (ref 3.5–5.2)
Alkaline Phosphatase: 86 U/L (ref 39–117)
BUN: 15 mg/dL (ref 6–23)
CO2: 27 mEq/L (ref 19–32)
Calcium: 9.2 mg/dL (ref 8.4–10.5)
Chloride: 101 mEq/L (ref 96–112)
Creatinine, Ser: 1.09 mg/dL (ref 0.50–1.10)
GFR calc Af Amer: 64 mL/min — ABNORMAL LOW (ref >90)
GFR calc non Af Amer: 55 mL/min — ABNORMAL LOW (ref >90)
Glucose, Bld: 144 mg/dL — ABNORMAL HIGH (ref 70–99)
Potassium: 2.8 mEq/L — ABNORMAL LOW (ref 3.5–5.1)
Sodium: 139 mEq/L (ref 135–145)
Total Bilirubin: 0.5 mg/dL (ref 0.3–1.2)
Total Protein: 7.2 g/dL (ref 6.0–8.3)

## 2011-12-31 LAB — CBC WITH DIFFERENTIAL/PLATELET
Basophils Absolute: 0 10*3/uL (ref 0.0–0.1)
Basophils Relative: 0 % (ref 0–1)
Eosinophils Absolute: 0.7 10*3/uL (ref 0.0–0.7)
Eosinophils Relative: 4 % (ref 0–5)
HCT: 41.2 % (ref 36.0–46.0)
Hemoglobin: 13.8 g/dL (ref 12.0–15.0)
Lymphocytes Relative: 30 % (ref 12–46)
Lymphs Abs: 5.2 10*3/uL — ABNORMAL HIGH (ref 0.7–4.0)
MCH: 30.1 pg (ref 26.0–34.0)
MCHC: 33.5 g/dL (ref 30.0–36.0)
MCV: 89.8 fL (ref 78.0–100.0)
Monocytes Absolute: 1.4 10*3/uL — ABNORMAL HIGH (ref 0.1–1.0)
Monocytes Relative: 8 % (ref 3–12)
Neutro Abs: 10 10*3/uL — ABNORMAL HIGH (ref 1.7–7.7)
Neutrophils Relative %: 58 % (ref 43–77)
Platelets: 320 10*3/uL (ref 150–400)
RBC: 4.59 MIL/uL (ref 3.87–5.11)
RDW: 13.1 % (ref 11.5–15.5)
WBC: 17.3 10*3/uL — ABNORMAL HIGH (ref 4.0–10.5)

## 2011-12-31 LAB — URINALYSIS, ROUTINE W REFLEX MICROSCOPIC
Glucose, UA: NEGATIVE mg/dL
Ketones, ur: 15 mg/dL — AB
Nitrite: NEGATIVE
Protein, ur: 100 mg/dL — AB
Specific Gravity, Urine: 1.028 (ref 1.005–1.030)
Urobilinogen, UA: 1 mg/dL (ref 0.0–1.0)
pH: 6 (ref 5.0–8.0)

## 2011-12-31 LAB — TROPONIN I
Troponin I: <0.3 ng/mL (ref <0.30)
Troponin I: <0.3 ng/mL (ref <0.30)

## 2011-12-31 LAB — URINE MICROSCOPIC-ADD ON

## 2011-12-31 LAB — POTASSIUM: Potassium: 3.9 mEq/L (ref 3.5–5.1)

## 2011-12-31 LAB — LACTIC ACID, PLASMA: Lactic Acid, Venous: 0.9 mmol/L (ref 0.5–2.2)

## 2011-12-31 MED ORDER — POTASSIUM CHLORIDE 10 MEQ/100ML IV SOLN
10.0000 meq | INTRAVENOUS | Status: AC
  Administered 2011-12-31 (×6): 10 meq via INTRAVENOUS
  Filled 2011-12-31 (×6): qty 100

## 2011-12-31 MED ORDER — SODIUM CHLORIDE 0.9 % IV SOLN
Freq: Once | INTRAVENOUS | Status: AC
  Administered 2011-12-31: 125 mL/h via INTRAVENOUS

## 2011-12-31 MED ORDER — POTASSIUM CHLORIDE CRYS ER 20 MEQ PO TBCR
20.0000 meq | EXTENDED_RELEASE_TABLET | Freq: Once | ORAL | Status: AC
  Administered 2011-12-31: 20 meq via ORAL
  Filled 2011-12-31: qty 1

## 2011-12-31 NOTE — ED Provider Notes (Signed)
History     CSN: [id number]  Arrival date & time 12/31/11  1158   First MD Initiated Contact with Patient 12/31/11 1534      Chief Complaint  Patient presents with  . Dizziness    (Consider location/radiation/quality/duration/timing/severity/associated sxs/prior treatment) HPI Comments: Ms. Livingood presents from her place of work for evaluation of lightheadedness.   She works in the [employer] at [employer] and states that she bent over and immediately felt lightheaded and dizzy.  She reports that she felt SOB and became sweaty also.  She was unable to stand unassisted.  This sensation lasted greater than 30 minutes.  She states that she feels fine when just sitting but when standing still feels lightheaded.  She denies any fevers, changes in appetite, CP, cough, palpitations, NVD, bleeding, unilateral weakness, or any known sick contacts.  She developed a rash on her neck that has been treated with a topical cream by her PMD over the last week.  The rash was thought to be a contact dermatitis secondary to hypersensitivity to some jewelry she had been wearing.  Patient is a 56 y.o. female presenting with weakness. The history is provided by the patient. No language interpreter was used.  Weakness The primary symptoms include dizziness. Primary symptoms do not include headaches, seizures, fever, nausea or vomiting. The symptoms began 6 to 12 hours ago. The symptoms are improving. The neurological symptoms are diffuse. The symptoms occurred after standing up.  Dizziness also occurs with weakness and diaphoresis. Dizziness does not occur with nausea or vomiting.   Additional symptoms include weakness. Additional symptoms do not include neck stiffness, pain, lower back pain, leg pain, loss of balance, photophobia, nystagmus, hyperacusis, vertigo, anxiety or dysphoric mood. Medical issues also include diabetes and hypertension. Medical issues do not include seizures, cerebral vascular  accident, cancer, alcohol use, drug use or recent surgery.    Past Medical History  Diagnosis Date  . Hypertension     Past Surgical History  Procedure Date  . Cholecystectomy     No family history on file.  History  Substance Use Topics  . Smoking status: Never Smoker   . Smokeless tobacco: Not on file  . Alcohol Use: Yes     occ    OB History    Grav Para Term Preterm Abortions TAB SAB Ect Mult Living                  Review of Systems  Constitutional: Positive for diaphoresis and fatigue. Negative for fever, chills, activity change and appetite change.  HENT: Negative for congestion, facial swelling, rhinorrhea, drooling, trouble swallowing, neck pain, neck stiffness and sinus pressure.   Eyes: Negative.  Negative for photophobia.  Respiratory: Positive for shortness of breath. Negative for choking, chest tightness and wheezing.   Cardiovascular: Negative for chest pain, palpitations and leg swelling.  Gastrointestinal: Positive for constipation. Negative for nausea, vomiting, abdominal pain, diarrhea, blood in stool and abdominal distention.  Genitourinary: Negative.  Negative for dysuria, urgency, hematuria, decreased urine volume and difficulty urinating.  Musculoskeletal: Negative.   Skin: Positive for rash. Negative for color change, pallor and wound.  Neurological: Positive for dizziness, weakness and light-headedness. Negative for vertigo, seizures, syncope, speech difficulty, numbness, headaches and loss of balance.  Psychiatric/Behavioral: Negative.  Negative for dysphoric mood.    Allergies  Ciprofloxacin  Home Medications   Current Outpatient Rx  Name Route Sig Dispense Refill  . AMLODIPINE BESYLATE 10 MG PO TABS Oral Take 10  mg by mouth every evening.    . ASPIRIN EC 81 MG PO TBEC Oral Take 81 mg by mouth daily.    Marland Kitchen VITAMIN B-12 PO Oral Take 1 tablet by mouth daily.    Marland Kitchen EZETIMIBE-SIMVASTATIN 10-40 MG PO TABS Oral Take 1 tablet by mouth at bedtime.     Marland Kitchen HYDROXYZINE HCL 25 MG PO TABS Oral Take 1 tablet (25 mg total) by mouth every 6 (six) hours. As needed for itching 20 tablet 0  . PREDNISONE (PAK) 10 MG PO TABS Oral Take 10-60 mg by mouth daily. Patient is on a 6 day taper pack.  Starting with 60mg  on 12-29-11, and tapering by 10mg  daily until finished.    Marland Kitchen VITAMIN B-6 PO Oral Take 1 tablet by mouth daily.      BP 152/64  Pulse 74  Temp 97.9 F (36.6 C) (Oral)  Resp 16  SpO2 98%  Physical Exam  Nursing note and vitals reviewed. Constitutional: She is oriented to person, place, and time. She appears well-developed and well-nourished.  Non-toxic appearance. She does not have a sickly appearance. She does not appear ill. No distress. She is not intubated.  HENT:  Head: Normocephalic and atraumatic.  Right Ear: External ear normal.  Left Ear: External ear normal.  Nose: Nose normal.  Mouth/Throat: Oropharynx is clear and moist. No oropharyngeal exudate.  Eyes: Conjunctivae and EOM are normal. Pupils are equal, round, and reactive to light. Right eye exhibits no discharge. Left eye exhibits no discharge. No scleral icterus.  Neck: Normal range of motion. Neck supple. No JVD present. No tracheal deviation present. No thyromegaly present.  Cardiovascular: Normal rate, regular rhythm, S1 normal, normal heart sounds, intact distal pulses and normal pulses.  PMI is not displaced.  Exam reveals no gallop, no friction rub and no decreased pulses.   No murmur heard. Pulmonary/Chest: Breath sounds normal. No accessory muscle usage or stridor. No apnea, not tachypneic and not bradypneic. She is not intubated. No respiratory distress. She has no decreased breath sounds. She has no wheezes. She has no rhonchi. She has no rales. She exhibits no tenderness.  Abdominal: Soft. Bowel sounds are normal. She exhibits no distension and no mass. There is no tenderness. There is no rebound and no guarding.  Musculoskeletal: Normal range of motion. She  exhibits no edema and no tenderness.  Lymphadenopathy:    She has no cervical adenopathy.  Neurological: She is alert and oriented to person, place, and time. She has normal strength. She displays no atrophy and no tremor. No cranial nerve deficit or sensory deficit. She exhibits normal muscle tone. She displays a negative Romberg sign. Coordination normal. GCS eye subscore is 4. GCS verbal subscore is 5. GCS motor subscore is 6.  Skin: Skin is warm and dry. No rash noted. She is not diaphoretic. No erythema. No pallor.  Psychiatric: She has a normal mood and affect. Her speech is normal and behavior is normal. Judgment and thought content normal. Her mood appears not anxious. Her affect is not angry, not blunt, not labile and not inappropriate. She does not exhibit a depressed mood.    ED Course  Procedures (including critical care time)  Labs Reviewed  CBC WITH DIFFERENTIAL - Abnormal; Notable for the following:    WBC 17.3 (*)     Neutro Abs 10.0 (*)     Lymphs Abs 5.2 (*)     Monocytes Absolute 1.4 (*)     All other components within normal limits  COMPREHENSIVE METABOLIC PANEL - Abnormal; Notable for the following:    Potassium 2.8 (*)     Glucose, Bld 144 (*)     GFR calc non Af Amer 55 (*)     GFR calc Af Amer 64 (*)     All other components within normal limits  URINALYSIS, ROUTINE W REFLEX MICROSCOPIC - Abnormal; Notable for the following:    Color, Urine AMBER (*)  BIOCHEMICALS MAY BE AFFECTED BY COLOR   APPearance HAZY (*)     Hgb urine dipstick SMALL (*)     Bilirubin Urine SMALL (*)     Ketones, ur 15 (*)     Protein, ur 100 (*)     Leukocytes, UA TRACE (*)     All other components within normal limits  URINE MICROSCOPIC-ADD ON - Abnormal; Notable for the following:    Squamous Epithelial / LPF MANY (*)     Casts HYALINE CASTS (*)  GRANULAR CAST   Crystals CA OXALATE CRYSTALS (*)     All other components within normal limits  TROPONIN I   No results found.   No  diagnosis found.   Date: 12/31/2011  Rate: 66 bpm  Rhythm: sinus  QRS Axis: normal  Intervals: normal  ST/T Wave abnormalities: nonspecific ST changes  Conduction Disutrbances:none  Narrative Interpretation:   Old EKG Reviewed: changes noted - ST changes not seen on 2007 study      MDM  Pt presents s/p a near syncopal event at work.  She is currently stable, NAD.  Pt has no focal deficits on exam but labs performed in triage demonstrate a significant leukocytosis and hypokalemia.  She also has elevated urine ketones and protein.  BUN and creatinine are normal.  Pt does not appear clinically dehydrated.  Will obtain a CXR, orthostatic VS, lactic acid and initiate IV repletion of K+.  Secondary to signif hypokalemia, it may be necessary to seek admission for repletion as it will take several hours.  2030.  Pt is stable, NAD.  No focal deficits on repeat exam.  Orthostatic VS are negative.  Plan continue repletion of K+ in CDU, if symptoms remain resolved will D/C home.      Tobin Chad, MD 12/31/11 2033

## 2011-12-31 NOTE — ED Notes (Signed)
Lab at bedside

## 2011-12-31 NOTE — ED Notes (Addendum)
Pt ambulatory without assistance. Steady gait. Respirations even and regular. NAD.  Skin warm, dry, intact. No further needs at this time. Given 6th run of K.

## 2011-12-31 NOTE — ED Notes (Signed)
Patient received into CDU room 3, oriented to room and call light. Family member present at bedside. 20G peripheral IV present to right AC with normal saline infusing along with potassium run. Patient without any complaints.

## 2011-12-31 NOTE — ED Notes (Signed)
Has not taken her meds today she states

## 2011-12-31 NOTE — ED Notes (Signed)
Patient ambulating in room at this time with no difficulty, NAD noted

## 2011-12-31 NOTE — ED Provider Notes (Signed)
Patient placed in CDU to complete her course of potassium repletion.  Patient evaluated today for dizziness, lightheadness, and weakness, associated with mild shortness of breath and diaphoresis. Denies syncope, chest pain, N/V/D.  Has recently started on prednisone taper for dermatitis.  Lab testing reveals potassium of 2.8 and leukocytosis of 17.3.  Patient resting comfortably at present with family at bedside.  States she feels better after fluids and medication, no return of initial symptoms.  12:15 AM  Repeat potassium of 3.9.  Patient states she feels like she is ready to return home.  No return of symptoms.   Jimmye Norman, NP 01/01/12 [phone]

## 2011-12-31 NOTE — ED Provider Notes (Signed)
History     CSN: [id number]  Arrival date & time 12/27/11  1731   First MD Initiated Contact with Patient 12/27/11 1818      Chief Complaint  Patient presents with  . Rash    (Consider location/radiation/quality/duration/timing/severity/associated sxs/prior treatment) HPI Comments: Patient c/o rash to her neck, upper chest and back for one week.  Also c/o itching to the affected areas.  She denies exposure to new medications, plants, detergents or chemicals.  She also denies swelling, neck pain, arthralgias, difficulty swallowing or breathing.  Patient is a 56 y.o. female presenting with rash. The history is provided by the patient.  Rash  This is a new problem. The current episode started more than 2 days ago. The problem has not changed since onset.The problem is associated with an unknown factor. There has been no fever. The rash is present on the torso and neck. The patient is experiencing no pain. Associated symptoms include itching. Pertinent negatives include no blisters, no pain and no weeping. She has tried nothing for the symptoms. The treatment provided no relief.    Past Medical History  Diagnosis Date  . Hypertension     Past Surgical History  Procedure Date  . Cholecystectomy     History reviewed. No pertinent family history.  History  Substance Use Topics  . Smoking status: Never Smoker   . Smokeless tobacco: Not on file  . Alcohol Use: Yes     occ    OB History    Grav Para Term Preterm Abortions TAB SAB Ect Mult Living                  Review of Systems  Constitutional: Negative for fever, activity change and appetite change.  HENT: Negative for sore throat, facial swelling, trouble swallowing, neck pain and neck stiffness.   Respiratory: Negative for shortness of breath.   Gastrointestinal: Negative for nausea and vomiting.  Musculoskeletal: Negative for arthralgias.  Skin: Positive for itching and rash. Negative for color change.    Neurological: Negative for dizziness, weakness, numbness and headaches.  All other systems reviewed and are negative.    Allergies  Ciprofloxacin  Home Medications   Current Outpatient Rx  Name Route Sig Dispense Refill  . AMLODIPINE BESYLATE 10 MG PO TABS Oral Take 10 mg by mouth every evening.    . ASPIRIN EC 81 MG PO TBEC Oral Take 81 mg by mouth daily.    Marland Kitchen VITAMIN B-12 PO Oral Take 1 tablet by mouth daily.    Marland Kitchen EZETIMIBE-SIMVASTATIN 10-40 MG PO TABS Oral Take 1 tablet by mouth at bedtime.    Marland Kitchen VITAMIN B-6 PO Oral Take 1 tablet by mouth daily.    Marland Kitchen HYDROXYZINE HCL 25 MG PO TABS Oral Take 1 tablet (25 mg total) by mouth every 6 (six) hours. As needed for itching 20 tablet 0  . PREDNISONE (PAK) 10 MG PO TABS Oral Take 10-60 mg by mouth daily. Patient is on a 6 day taper pack.  Starting with 60mg  on 12-29-11, and tapering by 10mg  daily until finished.      BP 181/77  Pulse 90  Temp 98.2 F (36.8 C) (Oral)  Resp 20  Ht 5\' 7"  (1.702 m)  Wt 180 lb (81.647 kg)  BMI 28.19 kg/m2  SpO2 97%  Physical Exam  Nursing note and vitals reviewed. Constitutional: She is oriented to person, place, and time. She appears well-developed and well-nourished. No distress.  HENT:  Head: Normocephalic and atraumatic.  Mouth/Throat: Oropharynx is clear and moist.  Eyes: EOM are normal. Pupils are equal, round, and reactive to light.  Neck: Normal range of motion. Neck supple.  Cardiovascular: Normal rate, regular rhythm, normal heart sounds and intact distal pulses.   No murmur heard. Pulmonary/Chest: Effort normal and breath sounds normal. No respiratory distress.  Musculoskeletal: Normal range of motion.  Neurological: She is alert and oriented to person, place, and time. She exhibits normal muscle tone. Coordination normal.  Skin:       Scattered maculopapular rash to the upper chest, back and bilateral upper arms.  No pustules, vesicles or petichiea    ED Course  Procedures (including  critical care time)  Labs Reviewed - No data to display Dg Chest 2 View  12/31/2011  *RADIOLOGY REPORT*  Clinical Data: Shortness of breath  CHEST - 2 VIEW  Comparison: September 04, 2005.  Findings: Cardiomediastinal silhouette appears normal.  No acute pulmonary disease is noted.  Bony thorax is intact.  IMPRESSION: No acute cardiopulmonary abnormality seen.   Original Report Authenticated By: Venita Sheffield., M.D.      1. Dermatitis       MDM     Pt is alert, vital stable, non-toxic appearing.  Airway patent , no edema.  Maculopapular rash appears c/w a contact dermatitis.    Pt reports hx of elevated blood sugar with previous use of prednisone.  I will prescribe taper pack at pt's request.  I have advised her to monitior bld sugar closely and to discontinue if blood sugar becomes elevated.  Pt verbalized understanding and agreed to care plan  The patient appears reasonably screened and/or stabilized for discharge and I doubt any other medical condition or other [facility] requiring further screening, evaluation, or treatment in the ED at this time prior to discharge.     Tammy L. Wolverine, [place] 12/31/11 [phone]

## 2011-12-31 NOTE — ED Notes (Signed)
Patient states she has not taken any blood pressure meds today when advised of vitals

## 2011-12-31 NOTE — ED Notes (Signed)
Was t work got dizzy someone took her bp and it was lower than usual  Got sweaty she states no cp  Buy has abd pain  Denies dysuria

## 2012-01-01 NOTE — ED Notes (Signed)
Pt d/c home. Denies pain. Denies weakness. Denies SOB. Vitals stable. Respirations even and unlabored. NAD. Ambulatory. Family at bedside. No further requests at this time.

## 2012-01-03 NOTE — ED Provider Notes (Signed)
Medical screening examination/treatment/procedure(s) were performed by non-physician practitioner and as supervising physician I was immediately available for consultation/collaboration.  Brian Cook, MD 01/03/12 1821 

## 2012-01-17 NOTE — Progress Notes (Signed)
Observation review is complete for the 12/31/2011 visit.

## 2012-08-21 ENCOUNTER — Emergency Department (HOSPITAL_COMMUNITY)
Admission: EM | Admit: 2012-08-21 | Discharge: 2012-08-21 | Disposition: A | Discharge status: Home / Self Care | Payer: BC Managed Care – PPO | Attending: Emergency Medicine | Admitting: Emergency Medicine

## 2012-08-21 ENCOUNTER — Encounter (HOSPITAL_COMMUNITY): Payer: Self-pay | Admitting: *Deleted

## 2012-08-21 DIAGNOSIS — M549 Dorsalgia, unspecified: Secondary | ICD-10-CM | POA: Insufficient documentation

## 2012-08-21 DIAGNOSIS — R350 Frequency of micturition: Secondary | ICD-10-CM | POA: Insufficient documentation

## 2012-08-21 DIAGNOSIS — J069 Acute upper respiratory infection, unspecified: Secondary | ICD-10-CM | POA: Insufficient documentation

## 2012-08-21 DIAGNOSIS — I10 Essential (primary) hypertension: Secondary | ICD-10-CM | POA: Insufficient documentation

## 2012-08-21 DIAGNOSIS — H9209 Otalgia, unspecified ear: Secondary | ICD-10-CM | POA: Insufficient documentation

## 2012-08-21 DIAGNOSIS — J029 Acute pharyngitis, unspecified: Secondary | ICD-10-CM | POA: Insufficient documentation

## 2012-08-21 DIAGNOSIS — Z7982 Long term (current) use of aspirin: Secondary | ICD-10-CM | POA: Insufficient documentation

## 2012-08-21 DIAGNOSIS — Z79899 Other long term (current) drug therapy: Secondary | ICD-10-CM | POA: Insufficient documentation

## 2012-08-21 LAB — URINALYSIS, ROUTINE W REFLEX MICROSCOPIC
Bilirubin Urine: NEGATIVE
Glucose, UA: NEGATIVE mg/dL
Ketones, ur: NEGATIVE mg/dL
Leukocytes, UA: NEGATIVE
Nitrite: NEGATIVE
Protein, ur: NEGATIVE mg/dL
Specific Gravity, Urine: 1.015 (ref 1.005–1.030)
Urobilinogen, UA: 0.2 mg/dL (ref 0.0–1.0)
pH: 6 (ref 5.0–8.0)

## 2012-08-21 LAB — URINE MICROSCOPIC-ADD ON

## 2012-08-21 MED ORDER — SULFAMETHOXAZOLE-TRIMETHOPRIM 800-160 MG PO TABS: 1.0000 | ORAL_TABLET | Freq: Two times a day (BID) | ORAL | Status: DC

## 2012-08-21 NOTE — ED Provider Notes (Signed)
History     CSN: [id number]  Arrival date & time 08/21/12  1748   First MD Initiated Contact with Patient 08/21/12 1804      Chief Complaint  Patient presents with  . Sore Throat  . Back Pain  . Otalgia  . Urinary Frequency    (Consider location/radiation/quality/duration/timing/severity/associated sxs/prior treatment) HPI Comments: Patient comes to the ER for evaluation of multiple complaints. Patient reports that she has a sore throat. Started as a slight difficult, but not gotten worse. Pain is up into the left ear. She has developed a cough that is nonproductive. She has not had any fever. Symptoms all started 2 days ago. Patient also reports that she has had some urinary frequency. She does not have dysuria, but saw small amount of blood in her urine the other day. She does have some discomfort in the lower back area.  Patient is a 57 y.o. female presenting with pharyngitis, back pain, ear pain, and frequency.  Sore Throat  Back Pain Otalgia Urinary Frequency    Past Medical History  Diagnosis Date  . Hypertension     Past Surgical History  Procedure Laterality Date  . Cholecystectomy      No family history on file.  History  Substance Use Topics  . Smoking status: Never Smoker   . Smokeless tobacco: Not on file  . Alcohol Use: Yes     Comment: occ    OB History   Grav Para Term Preterm Abortions TAB SAB Ect Mult Living                  Review of Systems  HENT: Positive for ear pain.   Genitourinary: Positive for frequency.  Musculoskeletal: Positive for back pain.  All other systems reviewed and are negative.    Allergies  Ciprofloxacin  Home Medications   Current Outpatient Rx  Name  Route  Sig  Dispense  Refill  . amLODipine (NORVASC) 10 MG tablet   Oral   Take 10 mg by mouth every evening.         Marland Kitchen aspirin EC 81 MG tablet   Oral   Take 81 mg by mouth daily.         . Cyanocobalamin (VITAMIN B-12 PO)   Oral   Take 1 tablet  by mouth daily.         Marland Kitchen ezetimibe-simvastatin (VYTORIN) 10-40 MG per tablet   Oral   Take 1 tablet by mouth at bedtime.         . predniSONE (STERAPRED UNI-PAK) 10 MG tablet   Oral   Take 10-60 mg by mouth daily. Patient is on a 6 day taper pack.  Starting with 60mg  on 12-29-11, and tapering by 10mg  daily until finished.         . Pyridoxine HCl (VITAMIN B-6 PO)   Oral   Take 1 tablet by mouth daily.           BP 190/72  Pulse 82  Temp(Src) 98.3 F (36.8 C) (Oral)  Resp 18  Ht 5\' 7"  (1.702 m)  Wt 170 lb (77.111 kg)  BMI 26.62 kg/m2  SpO2 98%  Physical Exam  Constitutional: She is oriented to person, place, and time. She appears well-developed and well-nourished. No distress.  HENT:  Head: Normocephalic and atraumatic.  Right Ear: Hearing, tympanic membrane and external ear normal.  Left Ear: Tympanic membrane and external ear normal.  Nose: Nose normal.  Mouth/Throat: Oropharynx is clear and moist and  mucous membranes are normal.  Eyes: Conjunctivae and EOM are normal. Pupils are equal, round, and reactive to light.  Neck: Normal range of motion. Neck supple.  Cardiovascular: Normal rate, regular rhythm, S1 normal and S2 normal.  Exam reveals no gallop and no friction rub.   No murmur heard. Pulmonary/Chest: Effort normal and breath sounds normal. No respiratory distress. She exhibits no tenderness.  Abdominal: Soft. Normal appearance and bowel sounds are normal. There is no hepatosplenomegaly. There is no tenderness. There is no rebound, no guarding, no tenderness at McBurney's point and negative Murphy's sign. No hernia.  Musculoskeletal: Normal range of motion.  Neurological: She is alert and oriented to person, place, and time. She has normal strength. No cranial nerve deficit or sensory deficit. Coordination normal. GCS eye subscore is 4. GCS verbal subscore is 5. GCS motor subscore is 6.  Skin: Skin is warm, dry and intact. No rash noted. No cyanosis.   Psychiatric: She has a normal mood and affect. Her speech is normal and behavior is normal. Thought content normal.    ED Course  Procedures (including critical care time)  Labs Reviewed  URINALYSIS, ROUTINE W REFLEX MICROSCOPIC   No results found.   Diagnosis: 1. Upper respiratory infection 2. Urinary frequency    MDM  Patient comes to the ER with complaints of cough, sore throat and ear pain. Examination was unremarkable. Oxygenation is normal. No sign of ear infection. Patient also complaining of urinary frequency. Abdominal exam normal. Patient's urinalysis does not show any obvious infection. Based on the symptoms will treat with Bactrim.        Gilda Crease, MD 08/21/12 1905

## 2012-08-21 NOTE — ED Notes (Signed)
Sore throat, lower back pain, left ear pain, and urinary frequency all starting x 2 days ago.

## 2013-01-05 ENCOUNTER — Encounter: Payer: Self-pay | Admitting: Gastroenterology

## 2013-01-05 ENCOUNTER — Ambulatory Visit (INDEPENDENT_AMBULATORY_CARE_PROVIDER_SITE_OTHER): Payer: BC Managed Care – PPO | Admitting: Gastroenterology

## 2013-01-05 VITALS — BP 152/81 | HR 67 | Temp 98.2°F | Ht 67.0 in | Wt 182.0 lb

## 2013-01-05 DIAGNOSIS — R1314 Dysphagia, pharyngoesophageal phase: Secondary | ICD-10-CM

## 2013-01-05 DIAGNOSIS — R1310 Dysphagia, unspecified: Secondary | ICD-10-CM | POA: Insufficient documentation

## 2013-01-05 DIAGNOSIS — R0989 Other specified symptoms and signs involving the circulatory and respiratory systems: Secondary | ICD-10-CM

## 2013-01-05 DIAGNOSIS — R1319 Other dysphagia: Secondary | ICD-10-CM | POA: Insufficient documentation

## 2013-01-05 DIAGNOSIS — R1013 Epigastric pain: Secondary | ICD-10-CM

## 2013-01-05 DIAGNOSIS — K219 Gastro-esophageal reflux disease without esophagitis: Secondary | ICD-10-CM | POA: Insufficient documentation

## 2013-01-05 DIAGNOSIS — K5900 Constipation, unspecified: Secondary | ICD-10-CM

## 2013-01-05 MED ORDER — LUBIPROSTONE 24 MCG PO CAPS: 24.0000 ug | ORAL_CAPSULE | Freq: Two times a day (BID) | ORAL | Status: DC

## 2013-01-05 NOTE — Assessment & Plan Note (Signed)
Fleeting upper abdominal pain during night, ?musculoskeletal. Treat constipation. See if any difference in pain. Seems to be self-limiting. Work-up as planned.   Amitiza 24 mcg once daily but may increase to BID if needed.

## 2013-01-05 NOTE — Assessment & Plan Note (Signed)
No heart murmur. Remote imaging five years ago unremarkable. Abd u/s now.

## 2013-01-05 NOTE — Patient Instructions (Addendum)
1. Start Amitiza for constipation. Samples and prescription sent to your pharmacy. Start at one each morning with food. You may take up to two daily if needed.  2. Abdominal ultrasound will be scheduled.

## 2013-01-05 NOTE — Progress Notes (Signed)
Primary Care Physician:  Cassell Smiles., MD  Primary Gastroenterologist:  Roetta Sessions, MD   Chief Complaint  Patient presents with  . Abdominal Pain    HPI:  [name] is a 57 y.o. female here for further evaluation of abdominal pain. Wakes up 3 am with epigastric pain, like muscle spasm, shoots through the upper abdomen. Lasts for few seconds. A few minutes later she has "heartbeat" come up in her neck. Some nausea. No vomiting. No diaphoresis. No SOB. Not related to meals. Some heartburn. On nexium two years. Does not take every day because cannot afford too. Some solid food dysphagia. Especially meat. Remote esophageal dilation. Stays constipated. Miralax rarely. BM 1-2 per week. Strains. No melena, brbpr.   Current Outpatient Prescriptions  Medication Sig Dispense Refill  . ALPRAZolam (XANAX) 1 MG tablet Take 1 mg by mouth 3 (three) times daily as needed.       Marland Kitchen amLODipine (NORVASC) 10 MG tablet Take 10 mg by mouth every morning.       Marland Kitchen aspirin EC 81 MG tablet Take 81 mg by mouth every morning.       Marland Kitchen esomeprazole (NEXIUM) 20 MG capsule Take 20 mg by mouth as needed.      . simvastatin (ZOCOR) 20 MG tablet Take 20 mg by mouth daily.        No current facility-administered medications for this visit.    Allergies as of 01/05/2013 - Review Complete 01/05/2013  Allergen Reaction Noted  . Ciprofloxacin Shortness Of Breath 12/27/2011    Past Medical History  Diagnosis Date  . Hypertension   . Hyperlipidemia     Past Surgical History  Procedure Laterality Date  . Cholecystectomy    . Tubal ligation    . Esophagogastroduodenoscopy  09/11/2001    ZOX:[id number] ring otherwise normal esophagus/Normal stomach/Normal D1 and D2 status post passage of a 56 [place] Maloney dilator  . Colonoscopy    06/16/2004    WJX:BJYNWG rectum/Diminutive polyps of the splenic flexure and the cecum/The remainder of the colonic mucosa appeared normal    Family History  Problem  Relation Age of Onset  . Colon cancer Neg Hx   . Liver disease Neg Hx     History   Social History  . Marital Status: Single    Spouse Name: N/A    Number of Children: 3  . Years of Education: N/A   Occupational History  . textiles    Social History Main Topics  . Smoking status: Never Smoker   . Smokeless tobacco: Not on file  . Alcohol Use: Yes     Comment: occ  . Drug Use: No  . Sexual Activity:    Other Topics Concern  . Not on file   Social History Narrative  . No narrative on file      ROS:  General: Negative for anorexia, weight loss, fever, chills, fatigue, weakness. Eyes: Negative for vision changes.  ENT: Negative for hoarseness, difficulty swallowing , nasal congestion. CV: Negative for chest pain, angina, palpitations, dyspnea on exertion, peripheral edema.  Respiratory: Negative for dyspnea at rest, dyspnea on exertion, cough, sputum, wheezing.  GI: See history of present illness. GU:  Negative for dysuria, hematuria, urinary incontinence, urinary frequency, nocturnal urination.  MS: Negative for joint pain, low back pain.  Derm: Negative for rash or itching.  Neuro: Negative for weakness, abnormal sensation, seizure, frequent headaches, memory loss, confusion.  Psych: Negative for anxiety, depression, suicidal ideation, hallucinations.  Endo: Negative for  unusual weight change.  Heme: Negative for bruising or bleeding. Allergy: Negative for rash or hives.    Physical Examination:  BP 152/81  Pulse 67  Temp(Src) 98.2 F (36.8 C) (Oral)  Ht 5\' 7"  (1.702 m)  Wt 182 lb (82.555 kg)  BMI 28.5 kg/m2   General: Well-nourished, well-developed in no acute distress.  Head: Normocephalic, atraumatic.   Eyes: Conjunctiva pink, no icterus. Mouth: Oropharyngeal mucosa moist and pink , no lesions erythema or exudate. Neck: Supple without thyromegaly, masses, or lymphadenopathy.  Lungs: Clear to auscultation bilaterally.  Heart: Regular rate and rhythm,  no murmurs rubs or gallops.  Abdomen: Bowel sounds are normal, nontender, nondistended, no hepatosplenomegaly or masses, no hernia , no rebound or guarding.  +abdominal bruit heard best in left abdomen Rectal: not performed Extremities: No lower extremity edema. No clubbing or deformities.  Neuro: Alert and oriented x 4 , grossly normal neurologically.  Skin: Warm and dry, no rash or jaundice.   Psych: Alert and cooperative, normal mood and affect.  Labs: Records requested.  Imaging Studies: No results found.

## 2013-01-05 NOTE — Assessment & Plan Note (Signed)
Solid food dysphagia with h/o schatzki ring requiring dilation in the past. Currently symptoms only occasional. Consider elective EGD/ED in the future. She has h/o polyps in 2006 and not clear when next one is due since path unavailable in [facility]. We will request records.

## 2013-01-09 NOTE — Progress Notes (Unsigned)
Pt is scheduled for [place] on 01/12/2013 at 4:00 PM ( her requested day and time) . She is aware that she needs to fast for 8 hours prior to [place]. And arrive at 3:45 pm.

## 2013-01-09 NOTE — Progress Notes (Signed)
CC'd to PCP 

## 2013-01-11 ENCOUNTER — Other Ambulatory Visit (HOSPITAL_COMMUNITY): Payer: BC Managed Care – PPO

## 2013-01-12 ENCOUNTER — Other Ambulatory Visit (HOSPITAL_COMMUNITY): Payer: BC Managed Care – PPO

## 2013-01-12 ENCOUNTER — Ambulatory Visit (HOSPITAL_COMMUNITY)
Admission: RE | Admit: 2013-01-12 | Discharge: 2013-01-12 | Disposition: A | Discharge status: Home / Self Care | Payer: BC Managed Care – PPO | Source: Ambulatory Visit | Attending: Gastroenterology | Admitting: Gastroenterology

## 2013-01-12 DIAGNOSIS — R0989 Other specified symptoms and signs involving the circulatory and respiratory systems: Secondary | ICD-10-CM | POA: Insufficient documentation

## 2013-01-12 DIAGNOSIS — K5900 Constipation, unspecified: Secondary | ICD-10-CM

## 2013-01-12 DIAGNOSIS — R1013 Epigastric pain: Secondary | ICD-10-CM

## 2013-01-12 DIAGNOSIS — K219 Gastro-esophageal reflux disease without esophagitis: Secondary | ICD-10-CM

## 2013-01-12 DIAGNOSIS — N281 Cyst of kidney, acquired: Secondary | ICD-10-CM | POA: Insufficient documentation

## 2013-01-12 DIAGNOSIS — R1310 Dysphagia, unspecified: Secondary | ICD-10-CM

## 2013-01-12 DIAGNOSIS — Z9089 Acquired absence of other organs: Secondary | ICD-10-CM | POA: Insufficient documentation

## 2013-01-12 DIAGNOSIS — R1319 Other dysphagia: Secondary | ICD-10-CM

## 2013-01-17 ENCOUNTER — Encounter: Payer: Self-pay | Admitting: Gastroenterology

## 2013-01-17 NOTE — Progress Notes (Signed)
Received records. Labs from July 2014. Sodium 141, potassium 4.1, glucose 99, BUN 11, creatinine 0.76, total bilirubin 0.8, alkaline phosphatase 77, AST 18, ALT 20, albumin 4.4, calcium 9.7, white blood cell count 6600, hemoglobin 12.8, MCV 86.2, platelets 306,000, TSH 1.643.  Colonoscopy, Dr. Jena Gauss, February 2006. Polyps from the cecum and splenic flexure were hyperplastic.  At this time would recommend EGD/ED for epig pain/dysphagia.  She can wait until 06/2014 for f/u TCS unless she feels she has had significant change in bowel habits (then she can have TCS now).

## 2013-01-22 ENCOUNTER — Other Ambulatory Visit: Payer: Self-pay | Admitting: Internal Medicine

## 2013-01-22 DIAGNOSIS — R1013 Epigastric pain: Secondary | ICD-10-CM

## 2013-01-22 DIAGNOSIS — R1310 Dysphagia, unspecified: Secondary | ICD-10-CM

## 2013-01-22 LAB — CBC
HCT: 38 %
HGB: 12.8 g/dL
MCV: 86.2 fL
TSH: 1.64 u[IU]/mL (ref 0.41–5.90)
WBC: 6.6
platelet count: 306

## 2013-01-22 LAB — COMPREHENSIVE METABOLIC PANEL
ALT: 20 U/L (ref 7–35)
AST: 18 U/L
Albumin: 4.4
Alkaline Phosphatase: 77 U/L
BUN: 11 mg/dL (ref 4–21)
Calcium: 9.7 mg/dL
Creat: 0.76
Glucose: 99
Potassium: 4.1 mmol/L
Sodium: 141 mmol/L (ref 137–147)
Total Bilirubin: 0.8 mg/dL

## 2013-01-23 ENCOUNTER — Encounter (HOSPITAL_COMMUNITY): Payer: Self-pay | Admitting: Pharmacy Technician

## 2013-01-24 NOTE — Progress Notes (Signed)
Pt is scheduled for EGD tomorrow.

## 2013-01-25 ENCOUNTER — Encounter (HOSPITAL_COMMUNITY)
Admission: RE | Disposition: A | Discharge status: Home / Self Care | Payer: Self-pay | Source: Ambulatory Visit | Attending: Internal Medicine

## 2013-01-25 ENCOUNTER — Ambulatory Visit (HOSPITAL_COMMUNITY)
Admission: RE | Admit: 2013-01-25 | Discharge: 2013-01-25 | Disposition: A | Discharge status: Home / Self Care | Payer: BC Managed Care – PPO | Source: Ambulatory Visit | Attending: Internal Medicine | Admitting: Internal Medicine

## 2013-01-25 ENCOUNTER — Encounter (HOSPITAL_COMMUNITY): Payer: Self-pay | Admitting: *Deleted

## 2013-01-25 DIAGNOSIS — K449 Diaphragmatic hernia without obstruction or gangrene: Secondary | ICD-10-CM | POA: Insufficient documentation

## 2013-01-25 DIAGNOSIS — R1013 Epigastric pain: Secondary | ICD-10-CM

## 2013-01-25 DIAGNOSIS — I10 Essential (primary) hypertension: Secondary | ICD-10-CM | POA: Insufficient documentation

## 2013-01-25 DIAGNOSIS — K222 Esophageal obstruction: Secondary | ICD-10-CM | POA: Insufficient documentation

## 2013-01-25 DIAGNOSIS — R1310 Dysphagia, unspecified: Secondary | ICD-10-CM

## 2013-01-25 HISTORY — PX: ESOPHAGOGASTRODUODENOSCOPY (EGD) WITH ESOPHAGEAL DILATION: SHX5812

## 2013-01-25 SURGERY — ESOPHAGOGASTRODUODENOSCOPY (EGD) WITH ESOPHAGEAL DILATION
Anesthesia: Moderate Sedation

## 2013-01-25 MED ORDER — STERILE WATER FOR IRRIGATION IR SOLN
Status: DC | PRN
  Administered 2013-01-25: 13:00:00

## 2013-01-25 MED ORDER — ONDANSETRON HCL 4 MG/2ML IJ SOLN
INTRAMUSCULAR | Status: DC | PRN
  Administered 2013-01-25: 4 mg via INTRAVENOUS

## 2013-01-25 MED ORDER — MIDAZOLAM HCL 5 MG/5ML IJ SOLN
INTRAMUSCULAR | Status: AC
  Filled 2013-01-25: qty 10

## 2013-01-25 MED ORDER — BUTAMBEN-TETRACAINE-BENZOCAINE 2-2-14 % EX AERO
INHALATION_SPRAY | CUTANEOUS | Status: DC | PRN
  Administered 2013-01-25: 2 via TOPICAL

## 2013-01-25 MED ORDER — MEPERIDINE HCL 100 MG/ML IJ SOLN
INTRAMUSCULAR | Status: AC
  Filled 2013-01-25: qty 2

## 2013-01-25 MED ORDER — MEPERIDINE HCL 100 MG/ML IJ SOLN
INTRAMUSCULAR | Status: DC | PRN
  Administered 2013-01-25: 25 mg via INTRAVENOUS
  Administered 2013-01-25: 50 mg via INTRAVENOUS

## 2013-01-25 MED ORDER — ONDANSETRON HCL 4 MG/2ML IJ SOLN
INTRAMUSCULAR | Status: AC
  Filled 2013-01-25: qty 2

## 2013-01-25 MED ORDER — MIDAZOLAM HCL 5 MG/5ML IJ SOLN
INTRAMUSCULAR | Status: DC | PRN
  Administered 2013-01-25 (×2): 2 mg via INTRAVENOUS

## 2013-01-25 MED ORDER — SODIUM CHLORIDE 0.9 % IV SOLN
INTRAVENOUS | Status: DC
  Administered 2013-01-25: 12:00:00 via INTRAVENOUS

## 2013-01-25 NOTE — Op Note (Signed)
[facility] [address] [place], [address]   ENDOSCOPY PROCEDURE REPORT  PATIENT: Kimberly, Williams  MR#: [id number] BIRTHDATE: November 03, 1955 , 56  yrs. old GENDER: Female ENDOSCOPIST: R.  Roetta Sessions, MD FACP FACG REFERRED BY:  Artis Delay, M.D. PROCEDURE DATE:  01/25/2013 PROCEDURE:     EGD with Elease Hashimoto dilation  INDICATIONS:      Recurrent esophageal dysphagia in the setting of a known Schatzki's ring  INFORMED CONSENT:   The risks, benefits, limitations, alternatives and imponderables have been discussed.  The potential for biopsy, esophogeal dilation, etc. have also been reviewed.  Questions have been answered.  All parties agreeable.  Please see the history and physical in the medical record for more information.  MEDICATIONS:  Versed 4 mg IV and Demerol 75 mg IV. Zofran 4 mg IV. Cetacaine spray.  DESCRIPTION OF PROCEDURE:   The EG-[id number] ([id number])  endoscope was introduced through the mouth and advanced to the second portion of the duodenum without difficulty or limitations.  The mucosal surfaces were surveyed very carefully during advancement of the scope and upon withdrawal.  Retroflexion view of the proximal stomach and esophagogastric junction was performed.      FINDINGS: Schatzki's ring; otherwise, normal-appearing esophagus. Stomach empty. Small hiatal hernia. Abnormal gastric mucosa. Patent pylorus. Normal first and second portion of the duodenum  THERAPEUTIC / DIAGNOSTIC MANEUVERS PERFORMED:  A 54 French Maloney dilator was passed to full insertion easily. A look back revealed the ring was nicely ruptured with minimal bleeding and without apparent complication.   COMPLICATIONS:  None  IMPRESSION:   Schatzki's ring-dilated as described above. Small hiatal hernia to  RECOMMENDATIONS:   Continue Nexium 20 mg every day without fail. Followup with [place] on an as-needed basis.    _______________________________ R. Roetta Sessions, MD  FACP [facility] eSigned:  R. Roetta Sessions, MD FACP [facility] 01/25/2013 1:26 PM     CC:  PATIENT NAME:  Kimberly, Williams MR#: [id number]

## 2013-01-25 NOTE — H&P (View-Only) (Signed)
Primary Care Physician:  FUSCO,LAWRENCE J., MD  Primary Gastroenterologist:  Michael [name], MD   Chief Complaint  Patient presents with  . Abdominal Pain    HPI:  [name] is a 57 y.o. female here for further evaluation of abdominal pain. Wakes up 3 am with epigastric pain, like muscle spasm, shoots through the upper abdomen. Lasts for few seconds. A few minutes later she has "heartbeat" come up in her neck. Some nausea. No vomiting. No diaphoresis. No SOB. Not related to meals. Some heartburn. On nexium two years. Does not take every day because cannot afford too. Some solid food dysphagia. Especially meat. Remote esophageal dilation. Stays constipated. Miralax rarely. BM 1-2 per week. Strains. No melena, brbpr.   Current Outpatient Prescriptions  Medication Sig Dispense Refill  . ALPRAZolam (XANAX) 1 MG tablet Take 1 mg by mouth 3 (three) times daily as needed.       . amLODipine (NORVASC) 10 MG tablet Take 10 mg by mouth every morning.       . aspirin EC 81 MG tablet Take 81 mg by mouth every morning.       . esomeprazole (NEXIUM) 20 MG capsule Take 20 mg by mouth as needed.      . simvastatin (ZOCOR) 20 MG tablet Take 20 mg by mouth daily.        No current facility-administered medications for this visit.    Allergies as of 01/05/2013 - Review Complete 01/05/2013  Allergen Reaction Noted  . Ciprofloxacin Shortness Of Breath 12/27/2011    Past Medical History  Diagnosis Date  . Hypertension   . Hyperlipidemia     Past Surgical History  Procedure Laterality Date  . Cholecystectomy    . Tubal ligation    . Esophagogastroduodenoscopy  09/11/2001    RMR:Schatzkis ring otherwise normal esophagus/Normal stomach/Normal D1 and D2 status post passage of a 56 French Maloney dilator  . Colonoscopy    06/16/2004    RMR:Normal rectum/Diminutive polyps of the splenic flexure and the cecum/The remainder of the colonic mucosa appeared normal    Family History  Problem  Relation Age of Onset  . Colon cancer Neg Hx   . Liver disease Neg Hx     History   Social History  . Marital Status: Single    Spouse Name: N/A    Number of Children: 3  . Years of Education: N/A   Occupational History  . textiles    Social History Main Topics  . Smoking status: Never Smoker   . Smokeless tobacco: Not on file  . Alcohol Use: Yes     Comment: occ  . Drug Use: No  . Sexual Activity:    Other Topics Concern  . Not on file   Social History Narrative  . No narrative on file      ROS:  General: Negative for anorexia, weight loss, fever, chills, fatigue, weakness. Eyes: Negative for vision changes.  ENT: Negative for hoarseness, difficulty swallowing , nasal congestion. CV: Negative for chest pain, angina, palpitations, dyspnea on exertion, peripheral edema.  Respiratory: Negative for dyspnea at rest, dyspnea on exertion, cough, sputum, wheezing.  GI: See history of present illness. GU:  Negative for dysuria, hematuria, urinary incontinence, urinary frequency, nocturnal urination.  MS: Negative for joint pain, low back pain.  Derm: Negative for rash or itching.  Neuro: Negative for weakness, abnormal sensation, seizure, frequent headaches, memory loss, confusion.  Psych: Negative for anxiety, depression, suicidal ideation, hallucinations.  Endo: Negative for   unusual weight change.  Heme: Negative for bruising or bleeding. Allergy: Negative for rash or hives.    Physical Examination:  BP 152/81  Pulse 67  Temp(Src) 98.2 F (36.8 C) (Oral)  Ht 5' 7" (1.702 m)  Wt 182 lb (82.555 kg)  BMI 28.5 kg/m2   General: Well-nourished, well-developed in no acute distress.  Head: Normocephalic, atraumatic.   Eyes: Conjunctiva pink, no icterus. Mouth: Oropharyngeal mucosa moist and pink , no lesions erythema or exudate. Neck: Supple without thyromegaly, masses, or lymphadenopathy.  Lungs: Clear to auscultation bilaterally.  Heart: Regular rate and rhythm,  no murmurs rubs or gallops.  Abdomen: Bowel sounds are normal, nontender, nondistended, no hepatosplenomegaly or masses, no hernia , no rebound or guarding.  +abdominal bruit heard best in left abdomen Rectal: not performed Extremities: No lower extremity edema. No clubbing or deformities.  Neuro: Alert and oriented x 4 , grossly normal neurologically.  Skin: Warm and dry, no rash or jaundice.   Psych: Alert and cooperative, normal mood and affect.  Labs: Records requested.  Imaging Studies: No results found.    

## 2013-01-25 NOTE — Interval H&P Note (Signed)
History and Physical Interval Note:  01/25/2013 1:00 PM  AOLANIS CRISPEN  has presented today for surgery, with the diagnosis of Epigastric Pain and Dysphagia  The various methods of treatment have been discussed with the patient and family. After consideration of risks, benefits and other options for treatment, the patient has consented to  Procedure(s) with comments: ESOPHAGOGASTRODUODENOSCOPY (EGD) WITH ESOPHAGEAL DILATION (N/A) - 12:45 as a surgical intervention .  The patient's history has been reviewed, patient examined, no change in status, stable for surgery.  I have reviewed the patient's chart and labs.  Questions were answered to the patient's satisfaction.      No change. EGD with esophageal dilation as appropriate per plan.The risks, benefits, limitations, alternatives and imponderables have been reviewed with the patient. Potential for esophageal dilation, biopsy, etc. have also been reviewed.  Questions have been answered. All parties agreeable.  Eula Listen

## 2013-01-26 ENCOUNTER — Encounter (HOSPITAL_COMMUNITY): Payer: Self-pay | Admitting: Internal Medicine

## 2013-01-29 NOTE — Progress Notes (Signed)
Quick Note:  Please let the patient know that no abdominal aneurysm seen. 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 She had abdominal bruit on exam and this is unexplained at this time. Possibly secondary to decreased blood flow to the kidneys. I recommend she follow up with her PCP to discuss if other imaging needs to be done such as doppler u/s or MRA.  Cc: Dr. Sherwood Gambler. ______

## 2013-02-16 ENCOUNTER — Telehealth: Payer: Self-pay | Admitting: Internal Medicine

## 2013-02-16 NOTE — Telephone Encounter (Signed)
Dr. Sherwood Gambler called; pt in the office pain unchanged; Amitiza gave diarrhea ; pt with abd cramps; He is going to prescribe Nu-lev; I told him I would arrange f/u in our office next week; Darl Pikes, please call pt Monday morning and get her in next week.

## 2013-02-19 NOTE — Telephone Encounter (Signed)
LMOM for patient to return my call. I made OV for 0830 Tuesday (tomorrow) with RMR. That's the only thing available this week including extenders.

## 2013-02-20 ENCOUNTER — Encounter (INDEPENDENT_AMBULATORY_CARE_PROVIDER_SITE_OTHER): Payer: Self-pay

## 2013-02-20 ENCOUNTER — Ambulatory Visit: Payer: BC Managed Care – PPO | Admitting: Internal Medicine

## 2013-02-20 ENCOUNTER — Ambulatory Visit (INDEPENDENT_AMBULATORY_CARE_PROVIDER_SITE_OTHER): Payer: BC Managed Care – PPO | Admitting: Gastroenterology

## 2013-02-20 VITALS — BP 147/78 | HR 91 | Temp 97.4°F | Wt 186.0 lb

## 2013-02-20 VITALS — BP 147/78 | HR 91 | Temp 97.4°F | Ht 67.0 in | Wt 186.0 lb

## 2013-02-20 DIAGNOSIS — K5900 Constipation, unspecified: Secondary | ICD-10-CM

## 2013-02-20 DIAGNOSIS — K219 Gastro-esophageal reflux disease without esophagitis: Secondary | ICD-10-CM

## 2013-02-20 DIAGNOSIS — R1011 Right upper quadrant pain: Secondary | ICD-10-CM

## 2013-02-20 LAB — CBC WITH DIFFERENTIAL/PLATELET
Basophils Absolute: 0 10*3/uL (ref 0.0–0.1)
Basophils Relative: 0 % (ref 0–1)
Eosinophils Absolute: 0.3 10*3/uL (ref 0.0–0.7)
Eosinophils Relative: 4 % (ref 0–5)
HCT: 38.4 % (ref 36.0–46.0)
Hemoglobin: 12.9 g/dL (ref 12.0–15.0)
Lymphocytes Relative: 54 % — ABNORMAL HIGH (ref 12–46)
Lymphs Abs: 4.4 10*3/uL — ABNORMAL HIGH (ref 0.7–4.0)
MCH: 30.1 pg (ref 26.0–34.0)
MCHC: 33.6 g/dL (ref 30.0–36.0)
MCV: 89.7 fL (ref 78.0–100.0)
Monocytes Absolute: 0.3 10*3/uL (ref 0.1–1.0)
Monocytes Relative: 4 % (ref 3–12)
Neutro Abs: 3.1 10*3/uL (ref 1.7–7.7)
Neutrophils Relative %: 38 % — ABNORMAL LOW (ref 43–77)
Platelets: 306 10*3/uL (ref 150–400)
RBC: 4.28 MIL/uL (ref 3.87–5.11)
RDW: 12.9 % (ref 11.5–15.5)
WBC: 8.1 10*3/uL (ref 4.0–10.5)

## 2013-02-20 LAB — COMPREHENSIVE METABOLIC PANEL
ALT: 14 U/L (ref 0–35)
AST: 15 U/L (ref 0–37)
Albumin: 4.1 g/dL (ref 3.5–5.2)
Alkaline Phosphatase: 84 U/L (ref 39–117)
BUN: 8 mg/dL (ref 6–23)
CO2: 27 mEq/L (ref 19–32)
Calcium: 10 mg/dL (ref 8.4–10.5)
Chloride: 104 mEq/L (ref 96–112)
Creat: 0.94 mg/dL (ref 0.50–1.10)
Glucose, Bld: 92 mg/dL (ref 70–99)
Potassium: 3.6 mEq/L (ref 3.5–5.3)
Sodium: 142 mEq/L (ref 135–145)
Total Bilirubin: 0.5 mg/dL (ref 0.3–1.2)
Total Protein: 7.2 g/dL (ref 6.0–8.3)

## 2013-02-20 LAB — LIPASE: Lipase: 17 U/L (ref 11–59)

## 2013-02-20 MED ORDER — PANTOPRAZOLE SODIUM 40 MG PO TBEC: 40.0000 mg | DELAYED_RELEASE_TABLET | Freq: Every day | ORAL | Status: DC

## 2013-02-20 NOTE — Patient Instructions (Signed)
Please have blood work done today. We have scheduled you for a CT scan in the near future.  Stop Nexium. Start taking Protonix daily. This is for reflux. Take it 30 minutes before the first meal of the day.  Start adding supplemental fiber to your diet: Metamucil or Benefiber are good options.  Resume taking a probiotic daily.   Further recommendations once CT is reviewed!

## 2013-02-20 NOTE — Progress Notes (Signed)
Opened in error. See appropriate OV note from today.

## 2013-02-21 ENCOUNTER — Encounter: Payer: Self-pay | Admitting: Gastroenterology

## 2013-02-21 DIAGNOSIS — R1011 Right upper quadrant pain: Secondary | ICD-10-CM | POA: Insufficient documentation

## 2013-02-21 NOTE — Assessment & Plan Note (Signed)
Unclear etiology, EGD benign, no precipitating factors. Change to Protonix, check CBC, CMP, lipase, and proceed with CT abd/pelvis to wrap up work-up. Incidental finding of lipoma-like structure RUQ; doubt this is of concern or contributing to current presentation. Strongly question a musculoskeletal component at this point; low likelihood of malignancy. Supplemental fiber added to reduce any underlying constipation issues contributing.

## 2013-02-21 NOTE — Assessment & Plan Note (Signed)
Stop Nexium. Trial Protonix once daily. EGD up-to-date.

## 2013-02-21 NOTE — Progress Notes (Signed)
Referring Provider: Elfredia Nevins, MD Primary Care Physician:  Cassell Smiles., MD Primary GI: Dr. Jena Gauss   HPI:   [name] presents today in follow-up after recent EGD with 72 F dilation Sept 2014 by Dr. Jena Gauss. Small hiatal hernia noted. Has past history of constipation, prescribed Amitiza 24 mcg once daily to BID in Aug 2014. Last colonoscopy 2006 with hyperplastic polyps. [place] of abdomen Sept 2014 with atherosclerotic change in aorta but no AAA. This was ordered due to an abdominal bruit appreciated on prior physical exam. Patient called last week due to diarrhea from Amitiza, abdominal cramps. Prescribed Levsin by PCP for cramping.   States intermittent RUQ discomfort, radiation across upper abdomen, sometimes between shoulder blades. Used to only happen in the evenings, now more frequently. No associated or relieving factors. Associated nausea, no vomiting. Occasional lack of appetite, not taking Levsin. Dysphagia resolved since dilation. BM daily without need for Amitiza. Sometimes stool small.    Past Medical History  Diagnosis Date  . Hypertension   . Hyperlipidemia   . Hiatal hernia     small  . Schatzki's ring     Past Surgical History  Procedure Laterality Date  . Cholecystectomy    . Tubal ligation    . Esophagogastroduodenoscopy  09/11/2001    [id number] ring otherwise normal esophagus/Normal stomach/Normal D1 and D2 status post passage of a 56 [place] Maloney dilator  . Colonoscopy    06/16/2004    ION:[id number] rectum/Diminutive polyps of the splenic flexure and the cecum/The remainder of the colonic mucosa appeared normal pathology (hyperplastic polyps).  . Esophagogastroduodenoscopy (egd) with esophageal dilation N/A 01/25/2013    Dr. Jena Gauss- schatzki's ring- 52 F dilation. small hiatal hernia    Current Outpatient Prescriptions  Medication Sig Dispense Refill  . acetaminophen (TYLENOL) 500 MG tablet Take 1,000 mg by mouth every 6 (six) hours as needed for pain.       Marland Kitchen ALPRAZolam (XANAX) 1 MG tablet Take 1 mg by mouth 3 (three) times daily as needed for anxiety.       Marland Kitchen amLODipine (NORVASC) 10 MG tablet Take 10 mg by mouth every morning.       Marland Kitchen aspirin EC 81 MG tablet Take 81 mg by mouth every morning.       Marland Kitchen azithromycin (ZITHROMAX) 250 MG tablet Take 250-500 mg by mouth daily. Takes 2 tablets on first day and 1 tablet on days 2-5.      . B Complex Vitamins (B COMPLEX PO) Take 1 tablet by mouth daily.      Marland Kitchen esomeprazole (NEXIUM) 20 MG capsule Take 20 mg by mouth as needed (Heartburn).       . hyoscyamine (LEVSIN SL) 0.125 MG SL tablet Take 0.125 mg by mouth every 6 (six) hours as needed.       . pantoprazole (PROTONIX) 40 MG tablet Take 1 tablet (40 mg total) by mouth daily.  30 tablet  3  . simvastatin (ZOCOR) 20 MG tablet Take 20 mg by mouth daily.        No current facility-administered medications for this visit.    Allergies as of 02/20/2013 - Review Complete 02/20/2013  Allergen Reaction Noted  . Ciprofloxacin Shortness Of Breath 12/27/2011    Family History  Problem Relation Age of Onset  . Colon cancer Neg Hx   . Liver disease Neg Hx     History   Social History  . Marital Status: Single    Spouse Name: N/A    Number  of Children: 3  . Years of Education: N/A   Occupational History  . textiles    Social History Main Topics  . Smoking status: Never Smoker   . Smokeless tobacco: None  . Alcohol Use: Yes     Comment: occ  . Drug Use: No  . Sexual Activity:    Other Topics Concern  . None   Social History Narrative  . None    Review of Systems: As mentioned in HPI.   Physical Exam: BP 147/78  Pulse 91  Temp(Src) 97.4 F (36.3 C)  Wt 186 lb (84.369 kg)  BMI 29.12 kg/m2 General:   Alert and oriented. No distress noted. Pleasant and cooperative.  Head:  Normocephalic and atraumatic. Eyes:  Conjuctiva clear without scleral icterus. Mouth:  Oral mucosa pink and moist. Good dentition. No lesions. Heart:  S1,  S2 present without murmurs, rubs, or gallops. Regular rate and rhythm. Abdomen:  +BS, soft, non-tender and non-distended. No rebound or guarding. Rounded lipoma-like structure RUQ Msk:  Symmetrical without gross deformities. Normal posture. Extremities:  Without edema. Neurologic:  Alert and  oriented x4;  grossly normal neurologically. Skin:  Intact without significant lesions or rashes. Psych:  Alert and cooperative. Normal mood and affect.

## 2013-02-21 NOTE — Assessment & Plan Note (Signed)
Improved, no longer on Amitiza. Add supplemental fiber daily. Next colonoscopy in 2016.

## 2013-02-22 NOTE — Progress Notes (Signed)
cc'd to pcp 

## 2013-02-23 ENCOUNTER — Ambulatory Visit (HOSPITAL_COMMUNITY)
Admission: RE | Admit: 2013-02-23 | Discharge: 2013-02-23 | Disposition: A | Discharge status: Home / Self Care | Payer: BC Managed Care – PPO | Source: Ambulatory Visit | Attending: Gastroenterology | Admitting: Gastroenterology

## 2013-02-23 DIAGNOSIS — R1011 Right upper quadrant pain: Secondary | ICD-10-CM

## 2013-02-23 MED ORDER — IOHEXOL 300 MG/ML  SOLN
100.0000 mL | Freq: Once | INTRAMUSCULAR | Status: AC | PRN
  Administered 2013-02-23: 100 mL via INTRAVENOUS

## 2013-02-23 MED ORDER — SODIUM CHLORIDE 0.9 % IJ SOLN
INTRAMUSCULAR | Status: AC
  Filled 2013-02-23: qty 500

## 2013-02-28 NOTE — Progress Notes (Signed)
Quick Note:  Please let patient know her CT is without any new or acute findings.  LFTs, lipase, normal. No anemia. How is she doing since switching to Protonix? ______

## 2013-05-24 ENCOUNTER — Other Ambulatory Visit (HOSPITAL_COMMUNITY): Payer: Self-pay | Admitting: Internal Medicine

## 2013-05-24 DIAGNOSIS — Z139 Encounter for screening, unspecified: Secondary | ICD-10-CM

## 2013-05-29 ENCOUNTER — Ambulatory Visit (HOSPITAL_COMMUNITY): Payer: BC Managed Care – PPO

## 2013-08-02 ENCOUNTER — Encounter: Payer: Self-pay | Admitting: Neurology

## 2013-08-02 ENCOUNTER — Ambulatory Visit (INDEPENDENT_AMBULATORY_CARE_PROVIDER_SITE_OTHER): Payer: BC Managed Care – PPO | Admitting: Neurology

## 2013-08-02 ENCOUNTER — Encounter (INDEPENDENT_AMBULATORY_CARE_PROVIDER_SITE_OTHER): Payer: Self-pay

## 2013-08-02 VITALS — BP 180/83 | HR 78 | Temp 98.7°F | Ht 67.0 in | Wt 183.0 lb

## 2013-08-02 DIAGNOSIS — R51 Headache: Secondary | ICD-10-CM

## 2013-08-02 DIAGNOSIS — R519 Headache, unspecified: Secondary | ICD-10-CM

## 2013-08-02 DIAGNOSIS — I10 Essential (primary) hypertension: Secondary | ICD-10-CM

## 2013-08-02 DIAGNOSIS — G4733 Obstructive sleep apnea (adult) (pediatric): Secondary | ICD-10-CM

## 2013-08-02 DIAGNOSIS — G250 Essential tremor: Secondary | ICD-10-CM

## 2013-08-02 DIAGNOSIS — G252 Other specified forms of tremor: Secondary | ICD-10-CM

## 2013-08-02 NOTE — Progress Notes (Signed)
Subjective:    Patient ID: [name] is a 58 y.o. female.  HPI    Star Age, MD, PhD [facility] [address], [address] [place], [place] [address]  Dear Dr. Gerarda Fraction,  I saw your patient, [name], upon your kind request in my neurologic clinic today for initial consultation of her sleep disorder, in particular her prior diagnosis of obstructive sleep apnea. The patient is unaccompanied today. As you know, [name] is a 58 year old right-handed woman with an underlying medical history of hypertension, hyperlipidemia, reflux disease, as well as a diagnosis of possible Parkinson's disease who was diagnosed with obstructive sleep apnea several years ago but was not able to tolerate CPAP at the time, because of recurrent sinus problems. She presents for reevaluation and potential treatment of this. She previously used to see Dr. Merlene Laughter for her sleep disorder and R hand tremor. According to Dr. Freddie Apley notes, her RDI was 10/h, O2 nadir was 87% and her CPAP pressure was 10 cm. She has not used it for over 5 years, but still has the machine, which she forgot to bring today. She does endorse sleeping better with CPAP and her BP values were better when she was on treatment. I reviewed her baseling sleep study results from 08/15/03, which showed an RDI of 10.8/h, mean O2 of 93.7% and nadir of 87.2%. She had an increase in light stage sleep, absence of deep sleep and reduced REM sleep with delay in REM. She then had a CPAP study on 09/11/03, which I reviewed also: She was titrated to 10 cm. She had no significant PLMs in either test. She weight about 10 lb more then.  She still has recurrent sinus issues, and in fact, these did not necessarily get better after she stopped the CPAP. She has had issues with rising BP values from time to time.  She started noticing a R hand tremor some 12 years ago or even in her late 2s, progressive and now is noticeible  in both hands, worse with writing, action, not at rest. She has no FHx of PD or ET, but had one MU with tremors, but in the context of alcoholism. She has not been on any symptomatic treatment, but was given a Rx for a PD medicine, but did not take it; she was given samples. She works from 7 A to [employer], M-Th and occasionally on Fri and Sat from 7 to 3 PM. She works at [employer].   Her typical bedtime is reported to be around 9 PM and usual wake time is around 4:45 AM. Sleep onset typically occurs within 15 minutes. She reports feeling adequately rested upon awakening. She wakes up on an average 2 times in the middle of the night and has to go to the bathroom 1 times on a typical night. She reports occasional morning headaches.  She reports excessive daytime somnolence (EDS) and Her Epworth Sleepiness Score (ESS) is 6/24 today. She has not fallen asleep while driving. The patient has not been taking a planned nap.  She has been known to snore for the past many years. Snoring is reportedly moderate, and associated with choking sounds and witnessed apneas. The patient admits to a sense of choking or strangling feeling. There is report of nighttime reflux, with no nighttime cough experienced. The patient has not noted any RLS symptoms and is not known to kick while asleep or before falling asleep. There is no family history of RLS, but  2 cousins have OSA, on CPAP.  She is a restless sleeper and in the morning, the bed is quite disheveled.   She denies cataplexy, sleep paralysis, hypnagogic or hypnopompic hallucinations, or sleep attacks. She does not report any vivid dreams, nightmares, dream enactments, or parasomnias, such as sleep talking or sleep walking.  She consumes 1 caffeinated beverages per day, usually in the form of coffee or soda.   There is a TV in the bedroom and usually it is on at night.   Her Past Medical History Is Significant For: Past Medical History  Diagnosis Date  . Hypertension   .  Hyperlipidemia   . Hiatal hernia     small  . Schatzki's ring     Her Past Surgical History Is Significant For: Past Surgical History  Procedure Laterality Date  . Cholecystectomy    . Tubal ligation    . Esophagogastroduodenoscopy  09/11/2001    [id number] ring otherwise normal esophagus/Normal stomach/Normal D1 and D2 status post passage of a 56 [place] Maloney dilator  . Colonoscopy    06/16/2004    [id number] rectum/Diminutive polyps of the splenic flexure and the cecum/The remainder of the colonic mucosa appeared normal pathology (hyperplastic polyps).  . Esophagogastroduodenoscopy (egd) with esophageal dilation N/A 01/25/2013    Dr. Gala Romney- schatzki's ring- 75 F dilation. small hiatal hernia    Her Family History Is Significant For: Family History  Problem Relation Age of Onset  . Colon cancer Neg Hx   . Liver disease Neg Hx     Her Social History Is Significant For: History   Social History  . Marital Status: Single    Spouse Name: N/A    Number of Children: 3  . Years of Education: N/A   Occupational History  . textiles    Social History Main Topics  . Smoking status: Never Smoker   . Smokeless tobacco: None  . Alcohol Use: Yes     Comment: occ  . Drug Use: No  . Sexual Activity:    Other Topics Concern  . None   Social History Narrative  . None    Her Allergies Are:  Allergies  Allergen Reactions  . Ciprofloxacin Shortness Of Breath  :   Her Current Medications Are:  Outpatient Encounter Prescriptions as of 08/02/2013  Medication Sig  . acetaminophen (TYLENOL) 500 MG tablet Take 1,000 mg by mouth every 6 (six) hours as needed for pain.  Marland Kitchen ALPRAZolam (XANAX) 1 MG tablet Take 1 mg by mouth 3 (three) times daily as needed for anxiety.   Marland Kitchen amLODipine (NORVASC) 10 MG tablet Take 10 mg by mouth every morning.   Marland Kitchen aspirin EC 81 MG tablet Take 81 mg by mouth every morning.   . B Complex Vitamins (B COMPLEX PO) Take 1 tablet by mouth daily.  .  clobetasol ointment (TEMOVATE) 9.56 % Apply 1 application topically as needed.  Marland Kitchen esomeprazole (NEXIUM) 20 MG capsule Take 20 mg by mouth as needed (Heartburn).   . hyoscyamine (LEVSIN SL) 0.125 MG SL tablet Take 0.125 mg by mouth every 6 (six) hours as needed.   Marland Kitchen losartan (COZAAR) 100 MG tablet Take 1 tablet by mouth daily.  . pantoprazole (PROTONIX) 40 MG tablet Take 1 tablet (40 mg total) by mouth daily.  . simvastatin (ZOCOR) 20 MG tablet Take 20 mg by mouth daily.   . [DISCONTINUED] azithromycin (ZITHROMAX) 250 MG tablet Take 250-500 mg by mouth daily. Takes 2 tablets on first day and 1 tablet on  days 2-5.  :  Review of Systems:  Out of a complete 14 point review of systems, all are reviewed and negative with the exception of these symptoms as listed below: Review of Systems  Constitutional: Positive for activity change (disinterest), appetite change, fatigue and unexpected weight change.  HENT: Positive for trouble swallowing.   Eyes: Positive for visual disturbance (blurred vision).  Respiratory: Positive for apnea (snoring).   Cardiovascular: Positive for palpitations.  Gastrointestinal: Positive for constipation.  Endocrine: Positive for cold intolerance.  Genitourinary: Negative.   Musculoskeletal: Positive for arthralgias and myalgias.  Skin: Negative.   Allergic/Immunologic: Positive for environmental allergies.  Neurological: Positive for dizziness, tremors, weakness and numbness.  Hematological: Bruises/bleeds easily.  Psychiatric/Behavioral: Positive for confusion and decreased concentration. The patient is nervous/anxious.     Objective:  Neurologic Exam  Physical Exam Physical Examination:   Filed Vitals:   08/02/13 0844  BP: 180/83  Pulse: 78  Temp: 98.7 F (37.1 C)    General Examination: The patient is a very pleasant 58 y.o. female in no acute distress. She appears well-developed and well-nourished and adequately groomed.   HEENT: Normocephalic,  atraumatic, pupils are equal, round and reactive to light and accommodation. Funduscopic exam is normal with sharp disc margins noted. Extraocular tracking is good without limitation to gaze excursion or nystagmus noted. Normal smooth pursuit is noted. Hearing is grossly intact. Tympanic membranes are clear bilaterally. Face is symmetric with normal facial animation and normal facial sensation. Speech is clear with no dysarthria noted. There is no hypophonia. There is no lip, neck/head, jaw or voice tremor. Neck is supple with full range of passive and active motion. There are no carotid bruits on auscultation. Oropharynx exam reveals: mild mouth dryness, adequate dental hygiene and moderate airway crowding, due to larger tongue, tonsils in place and narrow airway entry. Mallampati is class II. Tongue protrudes centrally and palate elevates symmetrically. Tonsils are 1+ to 2+ in size. Neck size is 14 7/8 inches. She has a small overbite. Nasal inspection reveals no significant nasal mucosal bogginess or redness and no septal deviation.   Chest: Clear to auscultation without wheezing, rhonchi or crackles noted.  Heart: S1+S2+0, regular and normal without murmurs, rubs or gallops noted.   Abdomen: Soft, non-tender and non-distended with normal bowel sounds appreciated on auscultation.  Extremities: There is no pitting edema in the distal lower extremities bilaterally. Pedal pulses are intact.  Skin: Warm and dry without trophic changes noted. There are no varicose veins.  Musculoskeletal: exam reveals no obvious joint deformities, tenderness or joint swelling or erythema.   Neurologically:  Mental status: The patient is awake, alert and oriented in all 4 spheres. Her immediate and remote memory, attention, language skills and fund of knowledge are appropriate. There is no evidence of aphasia, agnosia, apraxia or anomia. Speech is clear with normal prosody and enunciation. Thought process is linear. Mood  is normal and affect is normal.  Cranial nerves II - XII are as described above under HEENT exam. In addition: shoulder shrug is normal with equal shoulder height noted. Motor exam: Normal bulk, strength and tone is noted. There is no drift, or rebound. There is no resting tremor. There is a bilateral upper extremity postural and action tremor, which is mild in degree on the left and mild to moderate on the right. There tremor frequency is fairly fast and the amplitude is small. On Archimedes spiral drawing there is moderate tremulousness noted. Handwriting is moderately tremulousness, but legible. There is  no evidence of micrographia.  Romberg is negative. Reflexes are 2+ throughout. Babinski: Toes are flexor bilaterally. Fine motor skills and coordination: intact with normal finger taps, normal hand movements, normal rapid alternating patting, normal foot taps and normal foot agility. There is no decrement in amplitude.  Cerebellar testing: No dysmetria or intention tremor on finger to nose testing. Heel to shin is unremarkable bilaterally. There is no truncal or gait ataxia.  Sensory exam: intact to light touch, pinprick, vibration, temperature sense in the upper and lower extremities.  Gait, station and balance: She stands easily. No veering to one side is noted. No leaning to one side is noted. Posture is age-appropriate and stance is narrow based. Gait shows normal stride length and normal pace. No problems turning are noted. She turns en bloc. Tandem walk is unremarkable. Intact toe and heel stance is noted.               Assessment and Plan:    In summary, YULEIDI SEHER is a very pleasant 58 y.o.-year old female with a prior history of obstructive sleep apnea who used to be on CPAP treatment but stopped using her machine in over 5 years ago because of recurrent sinus issues. She would like to be retested and retreated if possible. She does endorse feeling better and sleeping better overall  when she was on treatment. Her history and physical exam are also in keeping with essential tremor, affecting both UEs, R>L. I do not detect any evidence of parkinsonism in her exam, and I reassured her in that regard. We can consider symptomatic treatment of her tremors down the road but for now I would suggest we focus on the sleep test. She does feel that the occasional Xanax does tone down the tremor. I had a long chat with the patient about my findings and the diagnosis of OSA and ET, the prognosis and treatment options. We talked about medical treatments, surgical interventions and non-pharmacological approaches. I explained in particular the risks and ramifications of untreated moderate to severe OSA, especially with respect to developing cardiovascular disease down the Road, including congestive heart failure, difficult to treat hypertension, cardiac arrhythmias, or stroke. Even type 2 diabetes has, in part, been linked to untreated OSA. Symptoms of untreated OSA include daytime sleepiness, memory problems, mood irritability and mood disorder such as depression and anxiety, lack of energy, as well as recurrent headaches, especially morning headaches. We talked about trying to maintain a healthy lifestyle in general, as well as the importance of weight control. I encouraged the patient to eat healthy, exercise daily and keep well hydrated, to keep a scheduled bedtime and wake time routine, to not skip any meals and eat healthy snacks in between meals. I advised the patient not to drive when feeling sleepy. I recommended the following at this time: sleep study with potential positive airway pressure titration. The fact that she is about 10 pounds lighter than at the time of her first sleep test, may indicate that she has improved sleep disordered breathing at this time.  I explained the sleep test procedure to the patient and also outlined possible surgical and non-surgical treatment options of OSA,  including the use of a custom-made dental device (which would require a referral to a specialist dentist or oral surgeon), upper airway surgical options, such as pillar implants, radiofrequency surgery, tongue base surgery, and UPPP (which would involve a referral to an ENT surgeon). Rarely, jaw surgery such as mandibular advancement may be considered.  I also explained the CPAP treatment option to the patient, who indicated that she would be willing to try CPAP if the need arises. I explained the importance of being compliant with PAP treatment, not only for insurance purposes but primarily to improve Her symptoms, and for the patient's long term health benefit, including to reduce Her cardiovascular risks. I answered all her questions today and the patient was in agreement. I would like to see her back after the sleep study is completed and encouraged her to call with any interim questions, concerns, problems or updates.   Thank you very much for allowing me to participate in the care of this nice patient. If I can be of any further assistance to you please do not hesitate to call me at [phone].  Sincerely,   Star Age, MD, PhD

## 2013-08-02 NOTE — Patient Instructions (Signed)

## 2013-08-13 ENCOUNTER — Ambulatory Visit (INDEPENDENT_AMBULATORY_CARE_PROVIDER_SITE_OTHER): Payer: BC Managed Care – PPO | Admitting: Neurology

## 2013-08-13 DIAGNOSIS — I10 Essential (primary) hypertension: Secondary | ICD-10-CM

## 2013-08-13 DIAGNOSIS — R519 Headache, unspecified: Secondary | ICD-10-CM

## 2013-08-13 DIAGNOSIS — G479 Sleep disorder, unspecified: Secondary | ICD-10-CM

## 2013-08-13 DIAGNOSIS — R51 Headache: Secondary | ICD-10-CM

## 2013-08-13 DIAGNOSIS — G250 Essential tremor: Secondary | ICD-10-CM

## 2013-08-13 DIAGNOSIS — G4733 Obstructive sleep apnea (adult) (pediatric): Secondary | ICD-10-CM

## 2013-08-14 ENCOUNTER — Encounter: Payer: Self-pay | Admitting: Internal Medicine

## 2013-08-16 NOTE — Sleep Study (Signed)
See media tab for full report  

## 2013-08-29 ENCOUNTER — Telehealth: Payer: Self-pay | Admitting: Neurology

## 2013-08-29 NOTE — Telephone Encounter (Signed)
Please call and notify the patient that the recent sleep study showed overall very mild obstructive sleep apnea. I would like to go over the details of the study during a follow up appointment and if not already previously scheduled, arrange a followup appointment (please utilize a followu-up slot). Also, route or fax report to PCP and referring MD, if other than PCP.  Once you have spoken to patient, you can close this encounter.   Thanks,  Star Age, MD, PhD [facility])

## 2013-08-30 NOTE — Telephone Encounter (Signed)
I called the patient but was unable to leave a message due to her vm being full. I will try calling patient on Friday (08/31/2013)

## 2013-09-03 ENCOUNTER — Encounter: Payer: Self-pay | Admitting: *Deleted

## 2013-09-03 NOTE — Telephone Encounter (Signed)
I called the patient but was unable to leave a message about sleep study results due to her vm being full.

## 2014-04-17 ENCOUNTER — Other Ambulatory Visit (HOSPITAL_COMMUNITY): Payer: Self-pay | Admitting: Internal Medicine

## 2014-04-17 DIAGNOSIS — Z1231 Encounter for screening mammogram for malignant neoplasm of breast: Secondary | ICD-10-CM

## 2014-04-26 ENCOUNTER — Ambulatory Visit (HOSPITAL_COMMUNITY)
Admission: RE | Admit: 2014-04-26 | Discharge: 2014-04-26 | Disposition: A | Discharge status: Home / Self Care | Payer: BC Managed Care – PPO | Source: Ambulatory Visit | Attending: Internal Medicine | Admitting: Internal Medicine

## 2014-04-26 DIAGNOSIS — Z1231 Encounter for screening mammogram for malignant neoplasm of breast: Secondary | ICD-10-CM

## 2014-06-10 ENCOUNTER — Telehealth: Payer: Self-pay | Admitting: Internal Medicine

## 2014-06-10 NOTE — Telephone Encounter (Signed)
ON RECALL FOR COLONOSCOPY

## 2014-06-11 NOTE — Telephone Encounter (Signed)
Pt's last colonoscopy was 06/2004 by Dr.[name]. Letter mailed to call and schedule.

## 2014-08-26 ENCOUNTER — Telehealth: Payer: Self-pay | Admitting: Gastroenterology

## 2014-08-26 NOTE — Telephone Encounter (Signed)
Just making sure.  With all the "Second Opinions Need MD Review", did not want to step on toes.

## 2014-08-26 NOTE — Telephone Encounter (Signed)
Patient is wanting to transfer from Dr. Gala Romney for a second opinion for stomach pains.  Pt was last seen in 2014 and notes are in [facility] for review.  Can you advise if you would like to accept patient for second opinion.

## 2014-08-26 NOTE — Telephone Encounter (Signed)
No problem.  I would just take it as a transfer of care.

## 2014-08-26 NOTE — Telephone Encounter (Signed)
If GI history is over 60 year old do not need to review records.  She can be scheduled with any GI MD

## 2014-09-15 ENCOUNTER — Encounter (HOSPITAL_COMMUNITY): Payer: Self-pay | Admitting: Emergency Medicine

## 2014-09-15 ENCOUNTER — Emergency Department (HOSPITAL_COMMUNITY): Payer: BLUE CROSS/BLUE SHIELD

## 2014-09-15 ENCOUNTER — Emergency Department (HOSPITAL_COMMUNITY)
Admission: EM | Admit: 2014-09-15 | Discharge: 2014-09-15 | Disposition: A | Discharge status: Home / Self Care | Payer: BLUE CROSS/BLUE SHIELD | Attending: Emergency Medicine | Admitting: Emergency Medicine

## 2014-09-15 DIAGNOSIS — K219 Gastro-esophageal reflux disease without esophagitis: Secondary | ICD-10-CM | POA: Diagnosis not present

## 2014-09-15 DIAGNOSIS — R531 Weakness: Secondary | ICD-10-CM | POA: Insufficient documentation

## 2014-09-15 DIAGNOSIS — Z7982 Long term (current) use of aspirin: Secondary | ICD-10-CM | POA: Diagnosis not present

## 2014-09-15 DIAGNOSIS — I10 Essential (primary) hypertension: Secondary | ICD-10-CM | POA: Insufficient documentation

## 2014-09-15 DIAGNOSIS — R200 Anesthesia of skin: Secondary | ICD-10-CM | POA: Insufficient documentation

## 2014-09-15 DIAGNOSIS — Z79899 Other long term (current) drug therapy: Secondary | ICD-10-CM | POA: Diagnosis not present

## 2014-09-15 DIAGNOSIS — E785 Hyperlipidemia, unspecified: Secondary | ICD-10-CM | POA: Insufficient documentation

## 2014-09-15 DIAGNOSIS — R42 Dizziness and giddiness: Secondary | ICD-10-CM | POA: Diagnosis not present

## 2014-09-15 LAB — URINE MICROSCOPIC-ADD ON

## 2014-09-15 LAB — COMPREHENSIVE METABOLIC PANEL
ALT: 18 U/L (ref 14–54)
AST: 18 U/L (ref 15–41)
Albumin: 4.1 g/dL (ref 3.5–5.0)
Alkaline Phosphatase: 58 U/L (ref 38–126)
Anion gap: 8 (ref 5–15)
BUN: 15 mg/dL (ref 6–20)
CO2: 27 mmol/L (ref 22–32)
Calcium: 9.2 mg/dL (ref 8.9–10.3)
Chloride: 106 mmol/L (ref 101–111)
Creatinine, Ser: 0.71 mg/dL (ref 0.44–1.00)
GFR calc Af Amer: >60 mL/min (ref >60)
GFR calc non Af Amer: >60 mL/min (ref >60)
Glucose, Bld: 97 mg/dL (ref 70–99)
Potassium: 3.2 mmol/L — ABNORMAL LOW (ref 3.5–5.1)
Sodium: 141 mmol/L (ref 135–145)
Total Bilirubin: 0.9 mg/dL (ref 0.3–1.2)
Total Protein: 7.1 g/dL (ref 6.5–8.1)

## 2014-09-15 LAB — DIFFERENTIAL
Basophils Absolute: 0 10*3/uL (ref 0.0–0.1)
Basophils Relative: 0 % (ref 0–1)
Eosinophils Absolute: 0.2 10*3/uL (ref 0.0–0.7)
Eosinophils Relative: 3 % (ref 0–5)
Lymphocytes Relative: 42 % (ref 12–46)
Lymphs Abs: 3 10*3/uL (ref 0.7–4.0)
Monocytes Absolute: 0.4 10*3/uL (ref 0.1–1.0)
Monocytes Relative: 5 % (ref 3–12)
Neutro Abs: 3.5 10*3/uL (ref 1.7–7.7)
Neutrophils Relative %: 50 % (ref 43–77)

## 2014-09-15 LAB — CBC
HCT: 36.7 % (ref 36.0–46.0)
Hemoglobin: 12.2 g/dL (ref 12.0–15.0)
MCH: 29.7 pg (ref 26.0–34.0)
MCHC: 33.2 g/dL (ref 30.0–36.0)
MCV: 89.3 fL (ref 78.0–100.0)
Platelets: 303 10*3/uL (ref 150–400)
RBC: 4.11 MIL/uL (ref 3.87–5.11)
RDW: 12.4 % (ref 11.5–15.5)
WBC: 7 10*3/uL (ref 4.0–10.5)

## 2014-09-15 LAB — URINALYSIS, ROUTINE W REFLEX MICROSCOPIC
Bilirubin Urine: NEGATIVE
Glucose, UA: NEGATIVE mg/dL
Ketones, ur: NEGATIVE mg/dL
Leukocytes, UA: NEGATIVE
Nitrite: NEGATIVE
Protein, ur: NEGATIVE mg/dL
Specific Gravity, Urine: <1.005 — ABNORMAL LOW (ref 1.005–1.030)
Urobilinogen, UA: 0.2 mg/dL (ref 0.0–1.0)
pH: 6 (ref 5.0–8.0)

## 2014-09-15 LAB — PROTIME-INR
INR: 1.01 (ref 0.00–1.49)
Prothrombin Time: 13.4 seconds (ref 11.6–15.2)

## 2014-09-15 LAB — RAPID URINE DRUG SCREEN, HOSP PERFORMED
Amphetamines: NOT DETECTED
Barbiturates: NOT DETECTED
Benzodiazepines: NOT DETECTED
Cocaine: NOT DETECTED
Opiates: NOT DETECTED
Tetrahydrocannabinol: NOT DETECTED

## 2014-09-15 LAB — APTT: aPTT: 27 seconds (ref 24–37)

## 2014-09-15 LAB — TROPONIN I: Troponin I: <0.03 ng/mL (ref <0.031)

## 2014-09-15 LAB — ETHANOL: Alcohol, Ethyl (B): <5 mg/dL (ref <5)

## 2014-09-15 MED ORDER — MECLIZINE HCL 12.5 MG PO TABS
25.0000 mg | ORAL_TABLET | Freq: Once | ORAL | Status: AC
  Administered 2014-09-15: 25 mg via ORAL
  Filled 2014-09-15: qty 2

## 2014-09-15 MED ORDER — SODIUM CHLORIDE 0.9 % IV BOLUS (SEPSIS)
500.0000 mL | Freq: Once | INTRAVENOUS | Status: AC
  Administered 2014-09-15: 500 mL via INTRAVENOUS

## 2014-09-15 MED ORDER — ASPIRIN 81 MG PO CHEW
324.0000 mg | CHEWABLE_TABLET | Freq: Once | ORAL | Status: AC
  Administered 2014-09-15: 324 mg via ORAL
  Filled 2014-09-15: qty 4

## 2014-09-15 MED ORDER — MECLIZINE HCL 25 MG PO TABS: 25.0000 mg | ORAL_TABLET | Freq: Three times a day (TID) | ORAL | Status: DC | PRN

## 2014-09-15 NOTE — ED Notes (Addendum)
Patient c/o dizziness that started when waking this morning. Per patient feels like room is spinning. Patient also reports some weakness on left side and blurred vision. Denies any slurred speech or dizziness.

## 2014-09-15 NOTE — Discharge Instructions (Signed)
If you were given medicines take as directed.  If you are on coumadin or contraceptives realize their levels and effectiveness is altered by many different medicines.  If you have any reaction (rash, tongues swelling, other) to the medicines stop taking and see a physician.   Have MRI of your brain done to more morning to look for signs of stroke. Take aspirin daily until he see her doctor early this week to discuss results. Please follow up as directed and return to the ER or see a physician for new or worsening symptoms.  Thank you. Filed Vitals:   09/15/14 1606 09/15/14 1645 09/15/14 1722  BP: 188/75  159/74  Pulse: 88  71  Temp: 98.6 F (37 C) 98.7 F (37.1 C)   TempSrc: Oral    Resp: 20  20  Height: 5\' 7"  (1.702 m)    Weight: 175 lb (79.379 kg)    SpO2: 100%  98%

## 2014-09-15 NOTE — ED Provider Notes (Signed)
CSN: [id number]     Arrival date & time 09/15/14  1556 History   First MD Initiated Contact with Patient 09/15/14 1611     Chief Complaint  Patient presents with  . Dizziness     (Consider location/radiation/quality/duration/timing/severity/associated sxs/prior Treatment) HPI Comments: 59 year old female with history of reflux presents with dizziness and left-sided numbness. Patient woke up this morning like the room is spinning/vertigo constant since then. No history of similar. Patient reports mild numbness and weakness on the left side since this morning but she is also had mild numbness for the past 2 weeks intermittent. No stroke history. No speech changes. Patient is not a blood thinners. No fevers or chills. No gait difficulty, mild blurry vision bilateral. Symptoms fairly constant nothing improves them mild worsening with bending forward.  Patient is a 59 y.o. female presenting with dizziness. The history is provided by the patient.  Dizziness Associated symptoms: weakness   Associated symptoms: no chest pain, no headaches, no shortness of breath and no vomiting     Past Medical History  Diagnosis Date  . Hypertension   . Hyperlipidemia   . Hiatal hernia     small  . Schatzki's ring    Past Surgical History  Procedure Laterality Date  . Cholecystectomy    . Tubal ligation    . Esophagogastroduodenoscopy  09/11/2001    [id number] ring otherwise normal esophagus/Normal stomach/Normal D1 and D2 status post passage of a 56 [place] Maloney dilator  . Colonoscopy    06/16/2004    [id number] rectum/Diminutive polyps of the splenic flexure and the cecum/The remainder of the colonic mucosa appeared normal pathology (hyperplastic polyps).  . Esophagogastroduodenoscopy (egd) with esophageal dilation N/A 01/25/2013    Dr. Gala Romney- schatzki's ring- 33 F dilation. small hiatal hernia   Family History  Problem Relation Age of Onset  . Colon cancer Neg Hx   . Liver disease Neg Hx   .  Diabetes Other    History  Substance Use Topics  . Smoking status: Never Smoker   . Smokeless tobacco: Never Used  . Alcohol Use: Yes     Comment: occ   OB History    Gravida Para Term Preterm AB TAB SAB Ectopic Multiple Living   4 3 3  1  1   3      Review of Systems  Constitutional: Negative for fever and chills.  HENT: Negative for congestion.   Eyes: Negative for visual disturbance.  Respiratory: Negative for shortness of breath.   Cardiovascular: Negative for chest pain.  Gastrointestinal: Negative for vomiting and abdominal pain.  Genitourinary: Negative for dysuria and flank pain.  Musculoskeletal: Negative for back pain, neck pain and neck stiffness.  Skin: Negative for rash.  Neurological: Positive for dizziness, weakness, light-headedness and numbness. Negative for headaches.      Allergies  Ciprofloxacin  Home Medications   Prior to Admission medications   Medication Sig Start Date End Date Taking? Authorizing Provider  acetaminophen (TYLENOL) 500 MG tablet Take 1,000 mg by mouth every 6 (six) hours as needed for pain.   Yes Historical Provider, MD  ALPRAZolam Duanne Moron) 1 MG tablet Take 1 mg by mouth 3 (three) times daily as needed for anxiety.  12/27/12  Yes Historical Provider, MD  amLODipine (NORVASC) 10 MG tablet Take 10 mg by mouth every morning.    Yes Historical Provider, MD  aspirin EC 81 MG tablet Take 81 mg by mouth every morning.    Yes Historical Provider, MD  esomeprazole (Loa)  20 MG capsule Take 20 mg by mouth as needed (Heartburn).    Yes Historical Provider, MD  losartan-hydrochlorothiazide (HYZAAR) 100-25 MG per tablet Take 1 tablet by mouth at bedtime. 09/14/14  Yes Historical Provider, MD  Multiple Vitamin (MULTIVITAMIN WITH MINERALS) TABS tablet Take 1 tablet by mouth daily.   Yes Historical Provider, MD  simvastatin (ZOCOR) 20 MG tablet Take 20 mg by mouth at bedtime.  12/26/12  Yes Historical Provider, MD  meclizine (ANTIVERT) 25 MG tablet Take  1 tablet (25 mg total) by mouth 3 (three) times daily as needed for dizziness. 09/15/14   Elnora Morrison, MD  pantoprazole (PROTONIX) 40 MG tablet Take 1 tablet (40 mg total) by mouth daily. Patient not taking: Reported on 09/15/2014 02/20/13   Orvil Feil, NP   BP 159/74 mmHg  Pulse 71  Temp(Src) 98.7 F (37.1 C) (Oral)  Resp 20  Ht 5\' 7"  (1.702 m)  Wt 175 lb (79.379 kg)  BMI 27.40 kg/m2  SpO2 98% Physical Exam  Constitutional: She is oriented to person, place, and time. She appears well-developed and well-nourished.  HENT:  Head: Normocephalic and atraumatic.  Eyes: Conjunctivae are normal. Right eye exhibits no discharge. Left eye exhibits no discharge.  Neck: Normal range of motion. Neck supple. No tracheal deviation present.  Cardiovascular: Normal rate and regular rhythm.   Pulmonary/Chest: Effort normal and breath sounds normal.  Abdominal: Soft. She exhibits no distension. There is no tenderness. There is no guarding.  Musculoskeletal: She exhibits no edema.  Neurological: She is alert and oriented to person, place, and time. GCS eye subscore is 4. GCS verbal subscore is 5. GCS motor subscore is 6.  No arm or leg drift 5+ strength in UE and LE with f/e at major joints. Sensation to palpation intact in UE and LE. CNs 2-12 grossly intact.  EOMFI.  PERRL.   Finger nose and coordination intact bilateral.   Visual fields intact to finger testing.   Skin: Skin is warm. No rash noted.  Psychiatric: She has a normal mood and affect.  Nursing note and vitals reviewed.   ED Course  Procedures (including critical care time) Labs Review Labs Reviewed  COMPREHENSIVE METABOLIC PANEL - Abnormal; Notable for the following:    Potassium 3.2 (*)    All other components within normal limits  URINALYSIS, ROUTINE W REFLEX MICROSCOPIC - Abnormal; Notable for the following:    Specific Gravity, Urine <1.005 (*)    Hgb urine dipstick TRACE (*)    All other components within normal limits   URINE MICROSCOPIC-ADD ON - Abnormal; Notable for the following:    Squamous Epithelial / LPF FEW (*)    All other components within normal limits  ETHANOL  PROTIME-INR  CBC  DIFFERENTIAL  URINE RAPID DRUG SCREEN (HOSP PERFORMED)  APTT  TROPONIN I    Imaging Review Ct Head Wo Contrast  09/15/2014   CLINICAL DATA:  Dizziness and vertigo. Weakness on the left side. Blurred vision.  EXAM: CT HEAD WITHOUT CONTRAST  TECHNIQUE: Contiguous axial images were obtained from the base of the skull through the vertex without intravenous contrast.  COMPARISON:  04/26/2007  FINDINGS: Partially empty sella.  Otherwise, the brainstem, cerebellum, cerebral peduncles, thalamus, basal ganglia, basilar cisterns, and ventricular system appear within normal limits. No intracranial hemorrhage, mass lesion, or acute CVA.  Mild chronic ethmoid sinusitis. Mild chronic left sphenoid sinusitis. No middle ear fluid. Internal auditory canals symmetric.  IMPRESSION: 1. No acute intracranial findings. 2. Partially empty  sella, chronic. 3. 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   Electronically Signed   By: Van Clines M.D.   On: 09/15/2014 17:13     EKG Interpretation   Date/Time:  Sunday Sep 15 2014 16:18:51 EDT Ventricular Rate:  85 PR Interval:  161 QRS Duration: 85 QT Interval:  416 QTC Calculation: 495 R Axis:   47 Text Interpretation:  Sinus rhythm Probable left atrial enlargement  Borderline prolonged QT interval Confirmed by ZAVITZ  MD, JOSHUA ([id number]) on  09/15/2014 4:41:57 PM      MDM   Final diagnoses:  Vertigo   Patient presents with new vertigo since this morning mild blurry vision and left-sided numbness. Discussed differential diagnosis including peripheral vertigo, central vertigo/stroke, other. Plan for blood work, CT head and likely MRI the brain.  Patient symptoms and signs resolved in the ER. No nystagmus in ED.  Likely peripheral.  CT scan results reviewed no acute  findings. Unable to obtain MRI on the weekend. Discussed options of transfer for MRI or close follow-up more primary Dr. in MRI to more. Patient prefers MRI tomorrow am and will return for worsening symptoms. Recommended aspirin until cc her doctor.\  Results and differential diagnosis were discussed with the patient/parent/guardian. Close follow up outpatient was discussed, comfortable with the plan.   Medications  meclizine (ANTIVERT) tablet 25 mg (25 mg Oral Given 09/15/14 1721)  sodium chloride 0.9 % bolus 500 mL (500 mLs Intravenous New Bag/Given 09/15/14 1721)  aspirin chewable tablet 324 mg (324 mg Oral Given 09/15/14 1743)    Filed Vitals:   09/15/14 1606 09/15/14 1645 09/15/14 1722  BP: 188/75  159/74  Pulse: 88  71  Temp: 98.6 F (37 C) 98.7 F (37.1 C)   TempSrc: Oral    Resp: 20  20  Height: 5\' 7"  (1.702 m)    Weight: 175 lb (79.379 kg)    SpO2: 100%  98%    Final diagnoses:  Vertigo         Elnora Morrison, MD 09/15/14 [phone]

## 2014-09-16 ENCOUNTER — Ambulatory Visit (HOSPITAL_COMMUNITY): Payer: BLUE CROSS/BLUE SHIELD

## 2015-06-22 ENCOUNTER — Emergency Department (HOSPITAL_COMMUNITY): Payer: BLUE CROSS/BLUE SHIELD

## 2015-06-22 ENCOUNTER — Emergency Department (HOSPITAL_COMMUNITY)
Admission: EM | Admit: 2015-06-22 | Discharge: 2015-06-22 | Disposition: A | Discharge status: Home / Self Care | Payer: BLUE CROSS/BLUE SHIELD | Attending: Emergency Medicine | Admitting: Emergency Medicine

## 2015-06-22 ENCOUNTER — Encounter (HOSPITAL_COMMUNITY): Payer: Self-pay | Admitting: *Deleted

## 2015-06-22 DIAGNOSIS — M79651 Pain in right thigh: Secondary | ICD-10-CM | POA: Diagnosis not present

## 2015-06-22 DIAGNOSIS — I10 Essential (primary) hypertension: Secondary | ICD-10-CM | POA: Diagnosis not present

## 2015-06-22 DIAGNOSIS — E876 Hypokalemia: Secondary | ICD-10-CM | POA: Diagnosis not present

## 2015-06-22 DIAGNOSIS — E785 Hyperlipidemia, unspecified: Secondary | ICD-10-CM | POA: Diagnosis not present

## 2015-06-22 DIAGNOSIS — Z87738 Personal history of other specified (corrected) congenital malformations of digestive system: Secondary | ICD-10-CM | POA: Insufficient documentation

## 2015-06-22 DIAGNOSIS — R0602 Shortness of breath: Secondary | ICD-10-CM | POA: Diagnosis not present

## 2015-06-22 DIAGNOSIS — R55 Syncope and collapse: Secondary | ICD-10-CM | POA: Diagnosis present

## 2015-06-22 DIAGNOSIS — Z7982 Long term (current) use of aspirin: Secondary | ICD-10-CM | POA: Diagnosis not present

## 2015-06-22 DIAGNOSIS — Z8719 Personal history of other diseases of the digestive system: Secondary | ICD-10-CM | POA: Insufficient documentation

## 2015-06-22 DIAGNOSIS — Z79899 Other long term (current) drug therapy: Secondary | ICD-10-CM | POA: Insufficient documentation

## 2015-06-22 DIAGNOSIS — R61 Generalized hyperhidrosis: Secondary | ICD-10-CM | POA: Insufficient documentation

## 2015-06-22 LAB — CBC WITH DIFFERENTIAL/PLATELET
Basophils Absolute: 0 10*3/uL (ref 0.0–0.1)
Basophils Relative: 0 %
Eosinophils Absolute: 0.4 10*3/uL (ref 0.0–0.7)
Eosinophils Relative: 4 %
HCT: 37.1 % (ref 36.0–46.0)
Hemoglobin: 12.3 g/dL (ref 12.0–15.0)
Lymphocytes Relative: 42 %
Lymphs Abs: 3.8 10*3/uL (ref 0.7–4.0)
MCH: 29.9 pg (ref 26.0–34.0)
MCHC: 33.2 g/dL (ref 30.0–36.0)
MCV: 90 fL (ref 78.0–100.0)
Monocytes Absolute: 0.5 10*3/uL (ref 0.1–1.0)
Monocytes Relative: 6 %
Neutro Abs: 4.3 10*3/uL (ref 1.7–7.7)
Neutrophils Relative %: 48 %
Platelets: 323 10*3/uL (ref 150–400)
RBC: 4.12 MIL/uL (ref 3.87–5.11)
RDW: 12.9 % (ref 11.5–15.5)
WBC: 9 10*3/uL (ref 4.0–10.5)

## 2015-06-22 LAB — BASIC METABOLIC PANEL
Anion gap: 10 (ref 5–15)
BUN: 15 mg/dL (ref 6–20)
CO2: 27 mmol/L (ref 22–32)
Calcium: 9.2 mg/dL (ref 8.9–10.3)
Chloride: 103 mmol/L (ref 101–111)
Creatinine, Ser: 0.76 mg/dL (ref 0.44–1.00)
GFR calc Af Amer: >60 mL/min (ref >60)
GFR calc non Af Amer: >60 mL/min (ref >60)
Glucose, Bld: 108 mg/dL — ABNORMAL HIGH (ref 65–99)
Potassium: 2.9 mmol/L — ABNORMAL LOW (ref 3.5–5.1)
Sodium: 140 mmol/L (ref 135–145)

## 2015-06-22 LAB — D-DIMER, QUANTITATIVE: D-Dimer, Quant: 0.29 ug/mL-FEU (ref 0.00–0.50)

## 2015-06-22 LAB — TROPONIN I
Troponin I: <0.03 ng/mL (ref <0.031)
Troponin I: <0.03 ng/mL (ref <0.031)

## 2015-06-22 MED ORDER — POTASSIUM CHLORIDE 10 MEQ/100ML IV SOLN
10.0000 meq | INTRAVENOUS | Status: AC
  Administered 2015-06-22 (×2): 10 meq via INTRAVENOUS
  Filled 2015-06-22: qty 100

## 2015-06-22 MED ORDER — POTASSIUM CHLORIDE CRYS ER 20 MEQ PO TBCR
40.0000 meq | EXTENDED_RELEASE_TABLET | Freq: Once | ORAL | Status: AC
  Administered 2015-06-22: 40 meq via ORAL
  Filled 2015-06-22: qty 2

## 2015-06-22 MED ORDER — POTASSIUM CHLORIDE CRYS ER 20 MEQ PO TBCR: 20.0000 meq | EXTENDED_RELEASE_TABLET | Freq: Every day | ORAL | Status: DC

## 2015-06-22 NOTE — Discharge Instructions (Signed)
Return to the ER or see your doctor immediately if you develop shortness of breath or chest pain, or if your leg pain hurts worse or you notice leg swelling. Otherwise follow up with your doctor in about a week for a recheck of your potassium

## 2015-06-22 NOTE — ED Provider Notes (Signed)
CSN: [id number]     Arrival date & time 06/22/15  0051 History   First MD Initiated Contact with Patient 06/22/15 0135     Chief Complaint  Patient presents with  . Near Syncope     (Consider location/radiation/quality/duration/timing/severity/associated sxs/prior Treatment) HPI  60 year old female presents with right leg pain and near-syncope. Patient states that around midnight tonight she woke up all of a sudden with pain in her right medial thigh. Does not room any type of injury. The pain is what woke her up. Very shortly after she started feeling dizzy like she's given a pass out, broke out into a sweat, and was short of breath. She states this lasted for a few minutes and then completely resolved. Did not seem to resolve until she had taken 2 aspirin. Her thigh pain has gone from a 10 down to a 1. Patient states the dizziness, shortness of breath, and sweating is completely resolved. Denies any leg swelling. No history of blood clots or recent immobilization or travel. No prior history of blood clots or cancer. Never had any chest pain. Pain in her leg feels like an aching, worse with movement.  Past Medical History  Diagnosis Date  . Hypertension   . Hyperlipidemia   . Hiatal hernia     small  . Schatzki's ring    Past Surgical History  Procedure Laterality Date  . Cholecystectomy    . Tubal ligation    . Esophagogastroduodenoscopy  09/11/2001    [id number] ring otherwise normal esophagus/Normal stomach/Normal D1 and D2 status post passage of a 56 [place] Maloney dilator  . Colonoscopy    06/16/2004    [id number] rectum/Diminutive polyps of the splenic flexure and the cecum/The remainder of the colonic mucosa appeared normal pathology (hyperplastic polyps).  . Esophagogastroduodenoscopy (egd) with esophageal dilation N/A 01/25/2013    Dr. Gala Romney- schatzki's ring- 4 F dilation. small hiatal hernia   Family History  Problem Relation Age of Onset  . Colon cancer Neg Hx   .  Liver disease Neg Hx   . Diabetes Other    Social History  Substance Use Topics  . Smoking status: Never Smoker   . Smokeless tobacco: Never Used  . Alcohol Use: Yes     Comment: occ   OB History    Gravida Para Term Preterm AB TAB SAB Ectopic Multiple Living   4 3 3  1  1   3      Review of Systems  Constitutional: Positive for diaphoresis.  Respiratory: Positive for shortness of breath.   Cardiovascular: Negative for chest pain.  Gastrointestinal: Positive for nausea. Negative for vomiting and abdominal pain.  Neurological: Positive for light-headedness. Negative for syncope, weakness and numbness.  All other systems reviewed and are negative.     Allergies  Ciprofloxacin  Home Medications   Prior to Admission medications   Medication Sig Start Date End Date Taking? Authorizing Provider  acetaminophen (TYLENOL) 500 MG tablet Take 1,000 mg by mouth every 6 (six) hours as needed for pain.   Yes Historical Provider, MD  ALPRAZolam Duanne Moron) 1 MG tablet Take 1 mg by mouth 3 (three) times daily as needed for anxiety.  12/27/12  Yes Historical Provider, MD  amLODipine (NORVASC) 10 MG tablet Take 10 mg by mouth every morning.    Yes Historical Provider, MD  aspirin EC 81 MG tablet Take 81 mg by mouth every morning.    Yes Historical Provider, MD  esomeprazole (NEXIUM) 20 MG capsule Take 20 mg  by mouth as needed (Heartburn).    Yes Historical Provider, MD  losartan-hydrochlorothiazide (HYZAAR) 100-25 MG per tablet Take 1 tablet by mouth at bedtime. 09/14/14  Yes Historical Provider, MD  Multiple Vitamin (MULTIVITAMIN WITH MINERALS) TABS tablet Take 1 tablet by mouth daily.   Yes Historical Provider, MD  simvastatin (ZOCOR) 20 MG tablet Take 20 mg by mouth at bedtime.  12/26/12  Yes Historical Provider, MD  meclizine (ANTIVERT) 25 MG tablet Take 1 tablet (25 mg total) by mouth 3 (three) times daily as needed for dizziness. 09/15/14   Elnora Morrison, MD  pantoprazole (PROTONIX) 40 MG tablet  Take 1 tablet (40 mg total) by mouth daily. Patient not taking: Reported on 09/15/2014 02/20/13   Orvil Feil, NP   BP 159/70 mmHg  Pulse 91  Temp(Src) 98 F (36.7 C) (Oral)  Resp 20  Ht 5\' 7"  (1.702 m)  Wt 183 lb (83.008 kg)  BMI 28.66 kg/m2  SpO2 99% Physical Exam  Constitutional: She is oriented to person, place, and time. She appears well-developed and well-nourished.  HENT:  Head: Normocephalic and atraumatic.  Right Ear: External ear normal.  Left Ear: External ear normal.  Nose: Nose normal.  Eyes: Right eye exhibits no discharge. Left eye exhibits no discharge.  Cardiovascular: Normal rate, regular rhythm and normal heart sounds.   No murmur heard. Pulses:      Dorsalis pedis pulses are 2+ on the right side, and 2+ on the left side.  Pulmonary/Chest: Effort normal and breath sounds normal. She has no wheezes. She has no rales.  Abdominal: Soft. There is no tenderness.  Musculoskeletal:       Right knee: She exhibits normal range of motion and no swelling. No tenderness found.       Right upper leg: She exhibits no tenderness and no swelling.       Right lower leg: She exhibits no tenderness and no swelling.       Legs: Neurological: She is alert and oriented to person, place, and time.  Skin: Skin is warm and dry.  Nursing note and vitals reviewed.   ED Course  Procedures (including critical care time) Labs Review Labs Reviewed  BASIC METABOLIC PANEL - Abnormal; Notable for the following:    Potassium 2.9 (*)    Glucose, Bld 108 (*)    All other components within normal limits  TROPONIN I  CBC WITH DIFFERENTIAL/PLATELET  D-DIMER, QUANTITATIVE (NOT AT [facility])  TROPONIN I    Imaging Review Dg Chest 2 View  06/22/2015  CLINICAL DATA:  Acute onset of near-syncope. Right thigh pain. Initial encounter. EXAM: CHEST  2 VIEW COMPARISON:  Chest radiograph performed 12/31/2011 FINDINGS: The lungs are well-aerated and clear. There is no evidence of focal opacification,  pleural effusion or pneumothorax. The heart is normal in size; the mediastinal contour is within normal limits. No acute osseous abnormalities are seen. IMPRESSION: No acute cardiopulmonary process seen. Electronically Signed   By: Garald Balding M.D.   On: 06/22/2015 02:16   I have personally reviewed and evaluated these images and lab results as part of my medical decision-making.   EKG Interpretation   Date/Time:  Sunday June 22 2015 01:21:28 EST Ventricular Rate:  82 PR Interval:  184 QRS Duration: 98 QT Interval:  399 QTC Calculation: 466 R Axis:   59 Text Interpretation:  Sinus rhythm Baseline wander in lead(s) V5 V6  nonspecific ST/T waves Confirmed by GOLDSTON  MD, SCOTT ([id number]) on  06/22/2015 1:34:48 AM  MDM   Final diagnoses:  Hypokalemia  Right thigh pain    It is unclear exactly why the patient is having right thigh pain. There is no swelling or calf pain. No joint pain such as knee pain. No real reproducible pain. At this point her pain has almost resolved after her aspirin treatment at home. I doubt DVT given no focal swelling and she is low risk. D-dimer is negative. Discussed this with patient who is comfortable with no further workup. Could be related to mild to moderate hypokalemia. This was replaced IV and orally and will be discharge home on oral medicine. As for her near-syncope and think this was most likely pain related given the severity of pain and close approximation of her symptoms. She did have transient shortness of breath and thus a d-dimer was sent but PE seems unlikely. Doubt ACS, has 2 negative troponins. Does have some nonspecific ST/T-wave changes but I highly doubt ACS. Discussed close follow-up with PCP as well as return precautions. Discharged home in good condition.    Sherwood Gambler, MD 06/22/15 [phone]

## 2015-06-22 NOTE — ED Notes (Signed)
Pt states that she was awaken from sleep when she started having right thigh pain that woke her up, after she woke up pt states that she started feeling hot and like she may pass out, took two 325mg  aspirin and felt better,

## 2015-07-03 ENCOUNTER — Telehealth: Payer: Self-pay

## 2015-07-03 NOTE — Telephone Encounter (Signed)
PT was referred by Rowan Blase for a screening colonoscopy.  LMOM to call.

## 2015-07-03 NOTE — Telephone Encounter (Signed)
PT returned call and I have triaged. Checking on previous report from about 10-11 years ago and will call her back.

## 2015-07-08 NOTE — Telephone Encounter (Signed)
Gastroenterology Pre-Procedure Review  Request Date:07/03/2015 Requesting Physician: Rowan Blase, PA  PATIENT REVIEW QUESTIONS: The patient responded to the following health history questions as indicated:    Pt's last colonoscopy was 06/16/2004 by Dr. Gala Romney  1. Diabetes Melitis: no 2. Joint replacements in the past 12 months: no 3. Major health problems in the past 3 months: no 4. Has an artificial valve or MVP: no 5. Has a defibrillator: no 6. Has been advised in past to take antibiotics in advance of a procedure like teeth cleaning: no 7. Family history of colon cancer: no  8. Alcohol Use: has a drink every couple of weeks or so 9. History of sleep apnea: no     MEDICATIONS & ALLERGIES:    Patient reports the following regarding taking any blood thinners:   Plavix? no Aspirin? YES Coumadin? no  Patient confirms/reports the following medications:  Current Outpatient Prescriptions  Medication Sig Dispense Refill  . acetaminophen (TYLENOL) 500 MG tablet Take 1,000 mg by mouth every 6 (six) hours as needed for pain.    Marland Kitchen ALPRAZolam (XANAX) 1 MG tablet Take 1 mg by mouth 3 (three) times daily as needed for anxiety.     Marland Kitchen amLODipine (NORVASC) 10 MG tablet Take 10 mg by mouth every morning.     Marland Kitchen aspirin EC 81 MG tablet Take 81 mg by mouth every morning.     Marland Kitchen esomeprazole (NEXIUM) 20 MG capsule Take 20 mg by mouth as needed (Heartburn).     . losartan (COZAAR) 100 MG tablet Take 100 mg by mouth daily.    . meclizine (ANTIVERT) 25 MG tablet Take 1 tablet (25 mg total) by mouth 3 (three) times daily as needed for dizziness. 10 tablet 0  . Multiple Vitamin (MULTIVITAMIN WITH MINERALS) TABS tablet Take 1 tablet by mouth daily.    . potassium chloride SA (K-DUR,KLOR-CON) 20 MEQ tablet Take 1 tablet (20 mEq total) by mouth daily. 3 tablet 0  . simvastatin (ZOCOR) 20 MG tablet Take 20 mg by mouth at bedtime.     Marland Kitchen losartan-hydrochlorothiazide (HYZAAR) 100-25 MG per tablet Take 1 tablet by mouth  at bedtime. Reported on 07/03/2015    . pantoprazole (PROTONIX) 40 MG tablet Take 1 tablet (40 mg total) by mouth daily. (Patient not taking: Reported on 09/15/2014) 30 tablet 3   No current facility-administered medications for this visit.    Patient confirms/reports the following allergies:  Allergies  Allergen Reactions  . Ciprofloxacin Shortness Of Breath    No orders of the defined types were placed in this encounter.    AUTHORIZATION INFORMATION Primary Insurance:  ID #:  Group #:  Pre-Cert / Auth required:  Pre-Cert / Auth #:   Secondary Insurance:   ID #:   Group #:  Pre-Cert / Auth required:  Pre-Cert / Auth #:   SCHEDULE INFORMATION: Procedure has been scheduled as follows:  Date:              Time:   Location:   This Gastroenterology Pre-Precedure Review Form is being routed to the following provider(s): R. Garfield Cornea, MD

## 2015-07-08 NOTE — Telephone Encounter (Signed)
Appropriate.

## 2015-07-15 ENCOUNTER — Other Ambulatory Visit: Payer: Self-pay

## 2015-07-15 DIAGNOSIS — Z1211 Encounter for screening for malignant neoplasm of colon: Secondary | ICD-10-CM

## 2015-07-15 MED ORDER — PEG 3350-KCL-NA BICARB-NACL 420 G PO SOLR: 4000.0000 mL | ORAL | Status: DC

## 2015-07-15 NOTE — Telephone Encounter (Signed)
Pt has been scheduled for 08/07/2015 at 11:30 Am with Dr. Gala Romney for the colonoscopy.  SHE HAS NOT HAD ANY CHANGE IN MEDS SINCE SHE WAS TRIAGED.   I am sending the prescription to the pharmacy and mailing her instructions.

## 2015-07-15 NOTE — Telephone Encounter (Signed)
Noted  

## 2015-07-28 ENCOUNTER — Telehealth: Payer: Self-pay

## 2015-07-28 NOTE — Telephone Encounter (Signed)
I called [employer] and spoke to [employer] who said a PA is not required for a screening colonoscopy as out patient at [facility].

## 2015-08-07 ENCOUNTER — Ambulatory Visit (HOSPITAL_COMMUNITY)
Admission: RE | Admit: 2015-08-07 | Discharge: 2015-08-07 | Disposition: A | Discharge status: Home / Self Care | Payer: BLUE CROSS/BLUE SHIELD | Source: Ambulatory Visit | Attending: Internal Medicine | Admitting: Internal Medicine

## 2015-08-07 ENCOUNTER — Encounter (HOSPITAL_COMMUNITY): Payer: Self-pay | Admitting: *Deleted

## 2015-08-07 ENCOUNTER — Encounter (HOSPITAL_COMMUNITY)
Admission: RE | Disposition: A | Discharge status: Home / Self Care | Payer: Self-pay | Source: Ambulatory Visit | Attending: Internal Medicine

## 2015-08-07 DIAGNOSIS — Z79899 Other long term (current) drug therapy: Secondary | ICD-10-CM | POA: Insufficient documentation

## 2015-08-07 DIAGNOSIS — E785 Hyperlipidemia, unspecified: Secondary | ICD-10-CM | POA: Diagnosis not present

## 2015-08-07 DIAGNOSIS — K5730 Diverticulosis of large intestine without perforation or abscess without bleeding: Secondary | ICD-10-CM | POA: Insufficient documentation

## 2015-08-07 DIAGNOSIS — Z860100 Personal history of colon polyps, unspecified: Secondary | ICD-10-CM | POA: Insufficient documentation

## 2015-08-07 DIAGNOSIS — Z87891 Personal history of nicotine dependence: Secondary | ICD-10-CM | POA: Insufficient documentation

## 2015-08-07 DIAGNOSIS — D124 Benign neoplasm of descending colon: Secondary | ICD-10-CM | POA: Insufficient documentation

## 2015-08-07 DIAGNOSIS — Z1211 Encounter for screening for malignant neoplasm of colon: Secondary | ICD-10-CM | POA: Diagnosis not present

## 2015-08-07 DIAGNOSIS — I10 Essential (primary) hypertension: Secondary | ICD-10-CM | POA: Diagnosis not present

## 2015-08-07 DIAGNOSIS — Z7982 Long term (current) use of aspirin: Secondary | ICD-10-CM | POA: Insufficient documentation

## 2015-08-07 HISTORY — PX: COLONOSCOPY: SHX5424

## 2015-08-07 SURGERY — COLONOSCOPY
Anesthesia: Moderate Sedation

## 2015-08-07 MED ORDER — MIDAZOLAM HCL 5 MG/5ML IJ SOLN
INTRAMUSCULAR | Status: DC | PRN
  Administered 2015-08-07: 1 mg via INTRAVENOUS
  Administered 2015-08-07 (×2): 2 mg via INTRAVENOUS

## 2015-08-07 MED ORDER — ONDANSETRON HCL 4 MG/2ML IJ SOLN
INTRAMUSCULAR | Status: AC
  Filled 2015-08-07: qty 2

## 2015-08-07 MED ORDER — ONDANSETRON HCL 4 MG/2ML IJ SOLN
INTRAMUSCULAR | Status: DC | PRN
  Administered 2015-08-07: 4 mg via INTRAVENOUS

## 2015-08-07 MED ORDER — SODIUM CHLORIDE 0.9 % IV SOLN
INTRAVENOUS | Status: DC
  Administered 2015-08-07: 11:00:00 via INTRAVENOUS

## 2015-08-07 MED ORDER — MIDAZOLAM HCL 5 MG/5ML IJ SOLN
INTRAMUSCULAR | Status: AC
  Filled 2015-08-07: qty 10

## 2015-08-07 MED ORDER — MEPERIDINE HCL 100 MG/ML IJ SOLN
INTRAMUSCULAR | Status: DC | PRN
  Administered 2015-08-07: 25 mg via INTRAVENOUS
  Administered 2015-08-07 (×2): 50 mg via INTRAVENOUS

## 2015-08-07 MED ORDER — MEPERIDINE HCL 100 MG/ML IJ SOLN
INTRAMUSCULAR | Status: AC
  Filled 2015-08-07: qty 2

## 2015-08-07 NOTE — H&P (Signed)
@LOGO @   Primary Care Physician:  Glo Herring., MD Primary Gastroenterologist:  Dr. Gala Romney  Pre-Procedure History & Physical: HPI:  [name] is a 60 y.o. female is here for a screening colonoscopy.  Reported negative exam about 11 years ago. Chronic constipation otherwise, no bowel symptoms. Tried and failed MiraLAX.  Past Medical History  Diagnosis Date  . Hypertension   . Hyperlipidemia   . Hiatal hernia     small  . Schatzki's ring     Past Surgical History  Procedure Laterality Date  . Cholecystectomy    . Tubal ligation    . Esophagogastroduodenoscopy  09/11/2001    [id number] ring otherwise normal esophagus/Normal stomach/Normal D1 and D2 status post passage of a 56 [place] dilator  . Colonoscopy    06/16/2004    [id number] rectum/Diminutive polyps of the splenic flexure and the cecum/The remainder of the colonic mucosa appeared normal pathology (hyperplastic polyps).  . Esophagogastroduodenoscopy (egd) with esophageal dilation N/A 01/25/2013    Dr. Gala Romney- schatzki's ring- 48 F dilation. small hiatal hernia    Prior to Admission medications   Medication Sig Start Date End Date Taking? Authorizing Provider  acetaminophen (TYLENOL) 500 MG tablet Take 1,000 mg by mouth every 6 (six) hours as needed for pain.   Yes Historical Provider, MD  amLODipine (NORVASC) 10 MG tablet Take 10 mg by mouth every morning.    Yes Historical Provider, MD  aspirin EC 81 MG tablet Take 81 mg by mouth every morning.    Yes Historical Provider, MD  esomeprazole (NEXIUM) 20 MG capsule Take 20 mg by mouth as needed (Heartburn).    Yes Historical Provider, MD  losartan (COZAAR) 100 MG tablet Take 100 mg by mouth daily.   Yes Historical Provider, MD  Multiple Vitamin (MULTIVITAMIN WITH MINERALS) TABS tablet Take 1 tablet by mouth daily.   Yes Historical Provider, MD  polyethylene glycol-electrolytes (TRILYTE) 420 g solution Take 4,000 mLs by mouth as directed. 07/15/15  Yes  Daneil Dolin, MD  potassium chloride SA (K-DUR,KLOR-CON) 20 MEQ tablet Take 1 tablet (20 mEq total) by mouth daily. 06/22/15  Yes Sherwood Gambler, MD  ALPRAZolam Duanne Moron) 1 MG tablet Take 1 mg by mouth 3 (three) times daily as needed for anxiety.  12/27/12   Historical Provider, MD  losartan-hydrochlorothiazide (HYZAAR) 100-25 MG per tablet Take 1 tablet by mouth at bedtime. Reported on 07/03/2015 09/14/14   Historical Provider, MD  meclizine (ANTIVERT) 25 MG tablet Take 1 tablet (25 mg total) by mouth 3 (three) times daily as needed for dizziness. 09/15/14   Elnora Morrison, MD  pantoprazole (PROTONIX) 40 MG tablet Take 1 tablet (40 mg total) by mouth daily. Patient not taking: Reported on 09/15/2014 02/20/13   Orvil Feil, NP  simvastatin (ZOCOR) 20 MG tablet Take 20 mg by mouth at bedtime.  12/26/12   Historical Provider, MD    Allergies as of 07/15/2015 - Review Complete 07/08/2015  Allergen Reaction Noted  . Ciprofloxacin Shortness Of Breath 12/27/2011    Family History  Problem Relation Age of Onset  . Colon cancer Neg Hx   . Liver disease Neg Hx   . Diabetes Other     Social History   Social History  . Marital Status: Single    Spouse Name: N/A  . Number of Children: 3  . Years of Education: N/A   Occupational History  . textiles    Social History Main Topics  . Smoking status: Former Smoker -- 0.50  packs/day for 35 years    Types: Cigarettes  . Smokeless tobacco: Never Used  . Alcohol Use: Yes     Comment: occ  . Drug Use: No  . Sexual Activity: Not on file   Other Topics Concern  . Not on file   Social History Narrative    Review of Systems: See HPI, otherwise negative ROS  Physical Exam: BP 169/66 mmHg  Pulse 69  Temp(Src) 98.2 F (36.8 C) (Oral)  Resp 13  Ht 5\' 7"  (1.702 m)  Wt 180 lb (81.647 kg)  BMI 28.19 kg/m2  SpO2 98% General:   Alert,  Well-developed, well-nourished, pleasant and cooperative in NAD Head:  Normocephalic and atraumatic. Eyes:  Sclera  clear, no icterus.   Conjunctiva pink. Lungs:  Clear throughout to auscultation.   No wheezes, crackles, or rhonchi. No acute distress. Heart:  Regular rate and rhythm; no murmurs, clicks, rubs,  or gallops. Abdomen:  Soft, nontender and nondistended. No masses, hepatosplenomegaly or hernias noted. Normal bowel sounds, without guarding, and without rebound.   Msk:  Symmetrical without gross deformities. Normal posture. Pulses:  Normal pulses noted. Extremities:  Without clubbing or edema.  Impression/Plan: [name] is now here to undergo a screening colonoscopy.  Average risk screening examination.  Risks, benefits, limitations, imponderables and alternatives regarding colonoscopy have been reviewed with the patient. Questions have been answered. All parties agreeable.     Notice:  This dictation was prepared with Dragon dictation along with smaller phrase technology. Any transcriptional errors that result from this process are unintentional and may not be corrected upon review.

## 2015-08-07 NOTE — Discharge Instructions (Signed)
Colonoscopy Discharge Instructions  Read the instructions outlined below and refer to this sheet in the next few weeks. These discharge instructions provide you with general information on caring for yourself after you leave the hospital. Your doctor may also give you specific instructions. While your treatment has been planned according to the most current medical practices available, unavoidable complications occasionally occur. If you have any problems or questions after discharge, call Dr. Gala Romney at [phone]. ACTIVITY  You may resume your regular activity, but move at a slower pace for the next 24 hours.   Take frequent rest periods for the next 24 hours.   Walking will help get rid of the air and reduce the bloated feeling in your belly (abdomen).   No driving for 24 hours (because of the medicine (anesthesia) used during the test).    Do not sign any important legal documents or operate any machinery for 24 hours (because of the anesthesia used during the test).  NUTRITION  Drink plenty of fluids.   You may resume your normal diet as instructed by your doctor.   Begin with a light meal and progress to your normal diet. Heavy or fried foods are harder to digest and may make you feel sick to your stomach (nauseated).   Avoid alcoholic beverages for 24 hours or as instructed.  MEDICATIONS  You may resume your normal medications unless your doctor tells you otherwise.  WHAT YOU CAN EXPECT TODAY  Some feelings of bloating in the abdomen.   Passage of more gas than usual.   Spotting of blood in your stool or on the toilet paper.  IF YOU HAD POLYPS REMOVED DURING THE COLONOSCOPY:  No aspirin products for 7 days or as instructed.   No alcohol for 7 days or as instructed.   Eat a soft diet for the next 24 hours.  FINDING OUT THE RESULTS OF YOUR TEST Not all test results are available during your visit. If your test results are not back during the visit, make an appointment  with your caregiver to find out the results. Do not assume everything is normal if you have not heard from your caregiver or the medical facility. It is important for you to follow up on all of your test results.  SEEK IMMEDIATE MEDICAL ATTENTION IF:  You have more than a spotting of blood in your stool.   Your belly is swollen (abdominal distention).   You are nauseated or vomiting.   You have a temperature over 101.   You have abdominal pain or discomfort that is severe or gets worse throughout the day.   Diverticulosis, constipation and polyp information provided  Begin Benefiber 1 tablespoon twice daily  Try Linzess 145 -  one capsule daily as needed for constipation  Further recommendations to follow pending review of pathology report      Diverticulosis Diverticulosis is the condition that develops when small pouches (diverticula) form in the wall of your colon. Your colon, or large intestine, is where water is absorbed and stool is formed. The pouches form when the inside layer of your colon pushes through weak spots in the outer layers of your colon. CAUSES  No one knows exactly what causes diverticulosis. RISK FACTORS  Being older than 43. Your risk for this condition increases with age. Diverticulosis is rare in people younger than 40 years. By age 71, almost everyone has it.  Eating a low-fiber diet.  Being frequently constipated.  Being overweight.  Not getting enough exercise.  Smoking.  Taking over-the-counter pain medicines, like aspirin and ibuprofen. SYMPTOMS  Most people with diverticulosis do not have symptoms. DIAGNOSIS  Because diverticulosis often has no symptoms, health care providers often discover the condition during an exam for other colon problems. In many cases, a health care provider will diagnose diverticulosis while using a flexible scope to examine the colon (colonoscopy). TREATMENT  If you have never developed an infection related to  diverticulosis, you may not need treatment. If you have had an infection before, treatment may include:  Eating more fruits, vegetables, and grains.  Taking a fiber supplement.  Taking a live bacteria supplement (probiotic).  Taking medicine to relax your colon. HOME CARE INSTRUCTIONS   Drink at least 6-8 glasses of water each day to prevent constipation.  Try not to strain when you have a bowel movement.  Keep all follow-up appointments. If you have had an infection before:  Increase the fiber in your diet as directed by your health care provider or dietitian.  Take a dietary fiber supplement if your health care provider approves.  Only take medicines as directed by your health care provider. SEEK MEDICAL CARE IF:   You have abdominal pain.  You have bloating.  You have cramps.  You have not gone to the bathroom in 3 days. SEEK IMMEDIATE MEDICAL CARE IF:   Your pain gets worse.  Yourbloating becomes very bad.  You have a fever or chills, and your symptoms suddenly get worse.  You begin vomiting.  You have bowel movements that are bloody or black. MAKE SURE YOU:  Understand these instructions.  Will watch your condition.  Will get help right away if you are not doing well or get worse.   This information is not intended to replace advice given to you by your health care provider. Make sure you discuss any questions you have with your health care provider.   Document Released: 01/22/2004 Document Revised: 05/01/2013 Document Reviewed: 03/21/2013 Elsevier Interactive Patient Education 2016 [employer].   Constipation, Adult Constipation is when a person has fewer than three bowel movements a week, has difficulty having a bowel movement, or has stools that are dry, hard, or larger than normal. As people grow older, constipation is more common. A low-fiber diet, not taking in enough fluids, and taking certain medicines may make constipation worse.  CAUSES     Certain medicines, such as antidepressants, pain medicine, iron supplements, antacids, and water pills.   Certain diseases, such as diabetes, irritable bowel syndrome (IBS), thyroid disease, or depression.   Not drinking enough water.   Not eating enough fiber-rich foods.   Stress or travel.   Lack of physical activity or exercise.   Ignoring the urge to have a bowel movement.   Using laxatives too much.  SIGNS AND SYMPTOMS   Having fewer than three bowel movements a week.   Straining to have a bowel movement.   Having stools that are hard, dry, or larger than normal.   Feeling full or bloated.   Pain in the lower abdomen.   Not feeling relief after having a bowel movement.  DIAGNOSIS  Your health care provider will take a medical history and perform a physical exam. Further testing may be done for severe constipation. Some tests may include:  A barium enema X-ray to examine your rectum, colon, and, sometimes, your small intestine.   A sigmoidoscopy to examine your lower colon.   A colonoscopy to examine your entire colon. TREATMENT  Treatment will  depend on the severity of your constipation and what is causing it. Some dietary treatments include drinking more fluids and eating more fiber-rich foods. Lifestyle treatments may include regular exercise. If these diet and lifestyle recommendations do not help, your health care provider may recommend taking over-the-counter laxative medicines to help you have bowel movements. Prescription medicines may be prescribed if over-the-counter medicines do not work.  HOME CARE INSTRUCTIONS   Eat foods that have a lot of fiber, such as fruits, vegetables, whole grains, and beans.  Limit foods high in fat and processed sugars, such as french fries, hamburgers, cookies, candies, and soda.   A fiber supplement may be added to your diet if you cannot get enough fiber from foods.   Drink enough fluids to keep your  urine clear or pale yellow.   Exercise regularly or as directed by your health care provider.   Go to the restroom when you have the urge to go. Do not hold it.   Only take over-the-counter or prescription medicines as directed by your health care provider. Do not take other medicines for constipation without talking to your health care provider first.  [place] IF:   You have bright red blood in your stool.   Your constipation lasts for more than 4 days or gets worse.   You have abdominal or rectal pain.   You have thin, pencil-like stools.   You have unexplained weight loss. MAKE SURE YOU:   Understand these instructions.  Will watch your condition.  Will get help right away if you are not doing well or get worse.   This information is not intended to replace advice given to you by your health care provider. Make sure you discuss any questions you have with your health care provider.   Document Released: 01/23/2004 Document Revised: 05/17/2014 Document Reviewed: 02/05/2013 Elsevier Interactive Patient Education 2016 Elsevier Inc.  Colon Polyps Polyps are lumps of extra tissue growing inside the body. Polyps can grow in the large intestine (colon). Most colon polyps are noncancerous (benign). However, some colon polyps can become cancerous over time. Polyps that are larger than a pea may be harmful. To be safe, caregivers remove and test all polyps. CAUSES  Polyps form when mutations in the genes cause your cells to grow and divide even though no more tissue is needed. RISK FACTORS There are a number of risk factors that can increase your chances of getting colon polyps. They include:  Being older than 50 years.  Family history of colon polyps or colon cancer.  Long-term colon diseases, such as colitis or Crohn disease.  Being overweight.  Smoking.  Being inactive.  Drinking too much alcohol. SYMPTOMS  Most small polyps do not cause  symptoms. If symptoms are present, they may include:  Blood in the stool. The stool may look dark red or black.  Constipation or diarrhea that lasts longer than 1 week. DIAGNOSIS People often do not know they have polyps until their caregiver finds them during a regular checkup. Your caregiver can use 4 tests to check for polyps:  Digital rectal exam. The caregiver wears gloves and feels inside the rectum. This test would find polyps only in the rectum.  Barium enema. The caregiver puts a liquid called barium into your rectum before taking X-rays of your colon. Barium makes your colon look white. Polyps are dark, so they are easy to see in the X-ray pictures.  Sigmoidoscopy. A thin, flexible tube (sigmoidoscope) is placed into  your rectum. The sigmoidoscope has a light and tiny camera in it. The caregiver uses the sigmoidoscope to look at the last third of your colon.  Colonoscopy. This test is like sigmoidoscopy, but the caregiver looks at the entire colon. This is the most common method for finding and removing polyps. TREATMENT  Any polyps will be removed during a sigmoidoscopy or colonoscopy. The polyps are then tested for cancer. PREVENTION  To help lower your risk of getting more colon polyps:  Eat plenty of fruits and vegetables. Avoid eating fatty foods.  Do not smoke.  Avoid drinking alcohol.  Exercise every day.  Lose weight if recommended by your caregiver.  Eat plenty of calcium and folate. Foods that are rich in calcium include milk, cheese, and broccoli. Foods that are rich in folate include chickpeas, kidney beans, and spinach. HOME CARE INSTRUCTIONS Keep all follow-up appointments as directed by your caregiver. You may need periodic exams to check for polyps. SEEK MEDICAL CARE IF: You notice bleeding during a bowel movement.   This information is not intended to replace advice given to you by your health care provider. Make sure you discuss any questions you have  with your health care provider.   Document Released: 01/21/2004 Document Revised: 05/17/2014 Document Reviewed: 07/06/2011 Elsevier Interactive Patient Education [employer].

## 2015-08-07 NOTE — Op Note (Signed)
[facility] Patient Name: [name] Procedure Date: 08/07/2015 11:33 AM MRN: [id number] Date of Birth: 02/05/1956 Attending MD: Norvel Richards , MD CSN: [id number] Age: 60 Admit Type: Outpatient Procedure:                Colonoscopy Indications:              Screening for colorectal malignant neoplasm Providers:                Norvel Richards, MD, Gwenlyn Fudge, RN, Isabella Stalling, Technician Referring MD:             Collene Mares PA-C, MD (Referring MD) Medicines:                Midazolam 5 mg IV, Meperidine 125 mg IV,                            Ondansetron 4 mg IV Complications:            No immediate complications. Estimated Blood Loss:     Estimated blood loss was minimal. Procedure:                Pre-Anesthesia Assessment:                           - Prior to the procedure, a History and Physical                            was performed, and patient medications and                            allergies were reviewed. The patient's tolerance of                            previous anesthesia was also reviewed. The risks                            and benefits of the procedure and the sedation                            options and risks were discussed with the patient.                            All questions were answered, and informed consent                            was obtained. Prior Anticoagulants: The patient has                            taken no previous anticoagulant or antiplatelet                            agents. ASA Grade Assessment: II - A patient with  mild systemic disease. After reviewing the risks                            and benefits, the patient was deemed in                            satisfactory condition to undergo the procedure.                           After obtaining informed consent, the colonoscope                            was passed under direct vision. Throughout  the                            procedure, the patient's blood pressure, pulse, and                            oxygen saturations were monitored continuously. The                            EC-[id number] scope was introduced through                            the anus and advanced to the the cecum, identified                            by appendiceal orifice and ileocecal valve. The                            colonoscopy was performed without difficulty. The                            patient tolerated the procedure well. The quality                            of the bowel preparation was adequate. The                            ileocecal valve, appendiceal orifice, and rectum                            were photographed. Scope In: 11:55:31 AM Scope Out: 12:10:29 PM Scope Withdrawal Time: 0 hours 8 minutes 20 seconds  Total Procedure Duration: 0 hours 14 minutes 58 seconds  Findings:      The perianal and digital rectal examinations were normal.      A 6 mm polyp was found in the descending colon. The polyp was sessile.       The polyp was removed with a cold snare. Resection and retrieval were       complete. Estimated blood loss was minimal.      Multiple medium-mouthed diverticula were found in the sigmoid colon. Impression:               - No specimens collected. Moderate Sedation:  Moderate (conscious) sedation was administered by the endoscopy nurse       and supervised by the endoscopist. The following parameters were       monitored: oxygen saturation, heart rate, blood pressure, respiratory       rate, EKG, adequacy of pulmonary ventilation, and response to care.       Total physician intraservice time was 25 minutes. Recommendation:           - Patient has a contact number available for                            emergencies. The signs and symptoms of potential                            delayed complications were discussed with the                             patient. Return to normal activities tomorrow.                            Written discharge instructions were provided to the                            patient.                           - Resume regular diet today.                           - Continue present medications.                           - Await pathology results.                           - Use Linzess (linaclotide) 145 mcg PO daily today.                           - Repeat colonoscopy date to be determined after                            pending pathology results are reviewed for                            surveillance based on pathology results.                           - Return to GI office at appointment to be                            scheduled. Procedure Code(s):        --- Professional ---                           [phone], Colonoscopy, flexible; with removal of  tumor(s), polyp(s), or other lesion(s) by snare                            technique                           [id number], Moderate sedation services provided by the                            same physician or other qualified health care                            professional performing the diagnostic or                            therapeutic service that the sedation supports,                            requiring the presence of an independent trained                            observer to assist in the monitoring of the                            patient's level of consciousness and physiological                            status; initial 15 minutes of intraservice time,                            patient age 66 years or older                           [id number], Moderate sedation services; each additional                            15 minutes intraservice time Diagnosis Code(s):        --- Professional ---                           Z12.11, Encounter for screening for malignant                            neoplasm of colon CPT copyright  2016 American Medical Association. All rights reserved. The codes documented in this report are preliminary and upon coder review may  be revised to meet current compliance requirements. Cristopher Estimable. [name], MD Norvel Richards, MD 08/07/2015 12:23:17 PM This report has been signed electronically. Number of Addenda: 0

## 2015-08-10 ENCOUNTER — Encounter: Payer: Self-pay | Admitting: Internal Medicine

## 2015-08-11 ENCOUNTER — Encounter (HOSPITAL_COMMUNITY): Payer: Self-pay | Admitting: Internal Medicine

## 2015-09-15 ENCOUNTER — Other Ambulatory Visit (HOSPITAL_COMMUNITY): Payer: Self-pay | Admitting: Family Medicine

## 2015-09-15 DIAGNOSIS — Z1231 Encounter for screening mammogram for malignant neoplasm of breast: Secondary | ICD-10-CM

## 2015-09-29 ENCOUNTER — Ambulatory Visit (HOSPITAL_COMMUNITY)
Admission: RE | Admit: 2015-09-29 | Discharge: 2015-09-29 | Disposition: A | Discharge status: Home / Self Care | Payer: BLUE CROSS/BLUE SHIELD | Source: Ambulatory Visit | Attending: Family Medicine | Admitting: Family Medicine

## 2015-09-29 DIAGNOSIS — Z1231 Encounter for screening mammogram for malignant neoplasm of breast: Secondary | ICD-10-CM | POA: Diagnosis not present

## 2015-09-29 DIAGNOSIS — K219 Gastro-esophageal reflux disease without esophagitis: Secondary | ICD-10-CM | POA: Diagnosis not present

## 2015-09-29 DIAGNOSIS — M353 Polymyalgia rheumatica: Secondary | ICD-10-CM | POA: Diagnosis not present

## 2015-09-29 DIAGNOSIS — M25569 Pain in unspecified knee: Secondary | ICD-10-CM | POA: Diagnosis not present

## 2015-09-29 DIAGNOSIS — Z1389 Encounter for screening for other disorder: Secondary | ICD-10-CM | POA: Diagnosis not present

## 2015-12-27 ENCOUNTER — Emergency Department (HOSPITAL_COMMUNITY)
Admission: EM | Admit: 2015-12-27 | Discharge: 2015-12-27 | Disposition: A | Discharge status: Home / Self Care | Payer: BLUE CROSS/BLUE SHIELD | Attending: Emergency Medicine | Admitting: Emergency Medicine

## 2015-12-27 ENCOUNTER — Encounter (HOSPITAL_COMMUNITY): Payer: Self-pay | Admitting: *Deleted

## 2015-12-27 ENCOUNTER — Emergency Department (HOSPITAL_COMMUNITY): Payer: BLUE CROSS/BLUE SHIELD

## 2015-12-27 DIAGNOSIS — M25552 Pain in left hip: Secondary | ICD-10-CM | POA: Diagnosis not present

## 2015-12-27 DIAGNOSIS — M76892 Other specified enthesopathies of left lower limb, excluding foot: Secondary | ICD-10-CM | POA: Diagnosis not present

## 2015-12-27 DIAGNOSIS — M6520 Calcific tendinitis, unspecified site: Secondary | ICD-10-CM

## 2015-12-27 DIAGNOSIS — F17210 Nicotine dependence, cigarettes, uncomplicated: Secondary | ICD-10-CM | POA: Insufficient documentation

## 2015-12-27 DIAGNOSIS — Z7982 Long term (current) use of aspirin: Secondary | ICD-10-CM | POA: Diagnosis not present

## 2015-12-27 DIAGNOSIS — I10 Essential (primary) hypertension: Secondary | ICD-10-CM | POA: Diagnosis not present

## 2015-12-27 DIAGNOSIS — M6528 Calcific tendinitis, other site: Secondary | ICD-10-CM | POA: Diagnosis not present

## 2015-12-27 DIAGNOSIS — Z79899 Other long term (current) drug therapy: Secondary | ICD-10-CM | POA: Insufficient documentation

## 2015-12-27 MED ORDER — DICLOFENAC SODIUM 75 MG PO TBEC: 75.0000 mg | DELAYED_RELEASE_TABLET | Freq: Two times a day (BID) | ORAL | 0 refills | Status: DC

## 2015-12-27 MED ORDER — DEXAMETHASONE SODIUM PHOSPHATE 4 MG/ML IJ SOLN
8.0000 mg | Freq: Once | INTRAMUSCULAR | Status: AC
  Administered 2015-12-27: 8 mg via INTRAMUSCULAR
  Filled 2015-12-27: qty 2

## 2015-12-27 MED ORDER — ONDANSETRON HCL 4 MG PO TABS
4.0000 mg | ORAL_TABLET | Freq: Once | ORAL | Status: AC
  Administered 2015-12-27: 4 mg via ORAL
  Filled 2015-12-27: qty 1

## 2015-12-27 MED ORDER — KETOROLAC TROMETHAMINE 10 MG PO TABS
10.0000 mg | ORAL_TABLET | Freq: Once | ORAL | Status: AC
  Administered 2015-12-27: 10 mg via ORAL
  Filled 2015-12-27: qty 1

## 2015-12-27 MED ORDER — TRAMADOL HCL 50 MG PO TABS: 50.0000 mg | ORAL_TABLET | Freq: Four times a day (QID) | ORAL | 0 refills | Status: DC | PRN

## 2015-12-27 MED ORDER — DEXAMETHASONE 4 MG PO TABS: 4.0000 mg | ORAL_TABLET | Freq: Two times a day (BID) | ORAL | 0 refills | Status: DC

## 2015-12-27 NOTE — ED Triage Notes (Signed)
Pt comes in with left hip pain that started several months ago. She states it started with her right hip then moved to her left. Pt is ambulatory with pain noted when walking.

## 2015-12-27 NOTE — Discharge Instructions (Signed)
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 Please see Dr. Aline Brochure for orthopedic evaluation and management of this issue. Please use diclofenac and Decadron 2 times daily with food.

## 2015-12-27 NOTE — ED Notes (Signed)
Pt made aware to return if symptoms worsen or if any life threatening symptoms occur.   

## 2015-12-27 NOTE — ED Provider Notes (Signed)
[facility] DEPT Provider Note   CSN: [id number] Arrival date & time: 12/27/15  0801     History   Chief Complaint Chief Complaint  Patient presents with  . Hip Pain    HPI [name] is a 60 y.o. female.  The history is provided by the patient.  Hip Pain  This is a chronic problem. The current episode started more than 1 week ago. The problem has been gradually worsening. Pertinent negatives include no abdominal pain. The symptoms are aggravated by walking and standing (pushing). Nothing relieves the symptoms. Treatments tried: ibuprofen. The treatment provided no relief.    Past Medical History:  Diagnosis Date  . Hiatal hernia    small  . Hyperlipidemia   . Hypertension   . Schatzki's ring     Patient Active Problem List   Diagnosis Date Noted  . History of colonic polyps   . Diverticulosis of colon without hemorrhage   . RUQ pain 02/21/2013  . Abdominal pain, epigastric 01/05/2013  . Abdominal bruit 01/05/2013  . Esophageal dysphagia 01/05/2013  . Unspecified constipation 01/05/2013  . GERD (gastroesophageal reflux disease) 01/05/2013    Past Surgical History:  Procedure Laterality Date  . CHOLECYSTECTOMY    . COLONOSCOPY    06/16/2004   [id number] rectum/Diminutive polyps of the splenic flexure and the cecum/The remainder of the colonic mucosa appeared normal pathology (hyperplastic polyps).  . COLONOSCOPY N/A 08/07/2015   Procedure: COLONOSCOPY;  Surgeon: Daneil Dolin, MD;  Location: [facility];  Service: Endoscopy;  Laterality: N/A;  11:30 Am  . ESOPHAGOGASTRODUODENOSCOPY  09/11/2001   [id number] ring otherwise normal esophagus/Normal stomach/Normal D1 and D2 status post passage of a 56 [place] Maloney dilator  . ESOPHAGOGASTRODUODENOSCOPY (EGD) WITH ESOPHAGEAL DILATION N/A 01/25/2013   Dr. Gala Romney- schatzki's ring- 71 F dilation. small hiatal hernia  . TUBAL LIGATION      OB History    Gravida Para Term Preterm AB Living   4 3 3   1 3      SAB TAB Ectopic Multiple Live Births   1               Home Medications    Prior to Admission medications   Medication Sig Start Date End Date Taking? Authorizing Provider  acetaminophen (TYLENOL) 500 MG tablet Take 1,000 mg by mouth every 6 (six) hours as needed for pain.    Historical Provider, MD  ALPRAZolam Duanne Moron) 1 MG tablet Take 1 mg by mouth 3 (three) times daily as needed for anxiety.  12/27/12   Historical Provider, MD  amLODipine (NORVASC) 10 MG tablet Take 10 mg by mouth every morning.     Historical Provider, MD  aspirin EC 81 MG tablet Take 81 mg by mouth every morning.     Historical Provider, MD  esomeprazole (NEXIUM) 20 MG capsule Take 20 mg by mouth as needed (Heartburn).     Historical Provider, MD  losartan (COZAAR) 100 MG tablet Take 100 mg by mouth daily.    Historical Provider, MD  meclizine (ANTIVERT) 25 MG tablet Take 1 tablet (25 mg total) by mouth 3 (three) times daily as needed for dizziness. 09/15/14   Elnora Morrison, MD  Multiple Vitamin (MULTIVITAMIN WITH MINERALS) TABS tablet Take 1 tablet by mouth daily.    Historical Provider, MD  polyethylene glycol-electrolytes (TRILYTE) 420 g solution Take 4,000 mLs by mouth as directed. 07/15/15   Daneil Dolin, MD  potassium chloride SA (K-DUR,KLOR-CON) 20 MEQ tablet Take 1 tablet (  20 mEq total) by mouth daily. 06/22/15   Sherwood Gambler, MD  simvastatin (ZOCOR) 20 MG tablet Take 20 mg by mouth at bedtime.  12/26/12   Historical Provider, MD    Family History Family History  Problem Relation Age of Onset  . Diabetes Other   . Colon cancer Neg Hx   . Liver disease Neg Hx     Social History Social History  Substance Use Topics  . Smoking status: Current Every Day Smoker    Packs/day: 0.25    Years: 35.00    Types: Cigarettes  . Smokeless tobacco: Never Used  . Alcohol use Yes     Comment: occ     Allergies   Ciprofloxacin   Review of Systems Review of Systems  Gastrointestinal: Negative for abdominal  pain.  Musculoskeletal: Positive for arthralgias.  All other systems reviewed and are negative.    Physical Exam Updated Vital Signs BP 175/92 (BP Location: Left Arm)   Pulse 94   Temp 98.2 F (36.8 C) (Oral)   Resp 18   Ht 5\' 7"  (1.702 m)   Wt 74.8 kg   SpO2 95%   BMI 25.84 kg/m   Physical Exam  Constitutional: She is oriented to person, place, and time. She appears well-developed and well-nourished.  Non-toxic appearance.  HENT:  Head: Normocephalic.  Right Ear: Tympanic membrane and external ear normal.  Left Ear: Tympanic membrane and external ear normal.  Eyes: EOM and lids are normal. Pupils are equal, round, and reactive to light.  Neck: Normal range of motion. Neck supple. Carotid bruit is not present.  Cardiovascular: Normal rate, regular rhythm, normal heart sounds, intact distal pulses and normal pulses.   Pulmonary/Chest: Breath sounds normal. No respiratory distress.  Abdominal: Soft. Bowel sounds are normal. There is no tenderness. There is no guarding.  Musculoskeletal: She exhibits tenderness. She exhibits no deformity.  There is pain with bearing weight, abduction, flexion and extension of the left hip. No hot areas appreciated. No deformity appreciated. There is some crepitus present.  Lymphadenopathy:       Head (right side): No submandibular adenopathy present.       Head (left side): No submandibular adenopathy present.    She has no cervical adenopathy.  Neurological: She is alert and oriented to person, place, and time. She has normal strength. No cranial nerve deficit or sensory deficit.  Skin: Skin is warm and dry.  Psychiatric: She has a normal mood and affect. Her speech is normal.  Nursing note and vitals reviewed.    ED Treatments / Results  Labs (all labs ordered are listed, but only abnormal results are displayed) Labs Reviewed - No data to display  EKG  EKG Interpretation None       Radiology No results  found.  Procedures Procedures (including critical care time)  Medications Ordered in ED Medications - No data to display   Initial Impression / Assessment and Plan / ED Course  Vital signs are within normal limits. X-ray of the hip is consistent with calcific tendinitis. No fracture or dislocation noted.  I discussed the findings with the patient in terms which he understands. The patient will be placed on steroids, and pain medication. The patient is to follow-up with orthopedics for evaluation in the office as soon as possible.   I have reviewed the triage vital signs and the nursing notes.  Pertinent labs & imaging results that were available during my care of the patient were reviewed by me  and considered in my medical decision making (see chart for details).  Clinical Course    **I have reviewed nursing notes, vital signs, and all appropriate lab and imaging results for this patient.*  Final Clinical Impressions(s) / ED Diagnoses  Patient was amateur he at the time of discharge. No hot joints appreciated. Questions were answered. Patient will be discharged home.    Final diagnoses:  Tendonitis, calcific  Left hip pain    New Prescriptions New Prescriptions   No medications on file     Lily Kocher, PA-C 12/27/15 [place], DO 12/30/15 1515

## 2016-02-26 DIAGNOSIS — M7062 Trochanteric bursitis, left hip: Secondary | ICD-10-CM | POA: Diagnosis not present

## 2016-05-20 DIAGNOSIS — Z713 Dietary counseling and surveillance: Secondary | ICD-10-CM | POA: Diagnosis not present

## 2016-05-20 DIAGNOSIS — Z1389 Encounter for screening for other disorder: Secondary | ICD-10-CM | POA: Diagnosis not present

## 2016-05-20 DIAGNOSIS — I10 Essential (primary) hypertension: Secondary | ICD-10-CM | POA: Diagnosis not present

## 2016-05-20 DIAGNOSIS — F419 Anxiety disorder, unspecified: Secondary | ICD-10-CM | POA: Diagnosis not present

## 2016-05-20 DIAGNOSIS — Z6827 Body mass index (BMI) 27.0-27.9, adult: Secondary | ICD-10-CM | POA: Diagnosis not present

## 2016-05-20 DIAGNOSIS — E663 Overweight: Secondary | ICD-10-CM | POA: Diagnosis not present

## 2016-05-20 DIAGNOSIS — E782 Mixed hyperlipidemia: Secondary | ICD-10-CM | POA: Diagnosis not present

## 2016-06-17 DIAGNOSIS — I10 Essential (primary) hypertension: Secondary | ICD-10-CM | POA: Diagnosis not present

## 2016-06-17 DIAGNOSIS — E663 Overweight: Secondary | ICD-10-CM | POA: Diagnosis not present

## 2016-06-17 DIAGNOSIS — M7552 Bursitis of left shoulder: Secondary | ICD-10-CM | POA: Diagnosis not present

## 2016-06-17 DIAGNOSIS — Z6826 Body mass index (BMI) 26.0-26.9, adult: Secondary | ICD-10-CM | POA: Diagnosis not present

## 2016-07-16 DIAGNOSIS — Z6828 Body mass index (BMI) 28.0-28.9, adult: Secondary | ICD-10-CM | POA: Diagnosis not present

## 2016-07-16 DIAGNOSIS — G250 Essential tremor: Secondary | ICD-10-CM | POA: Diagnosis not present

## 2016-07-16 DIAGNOSIS — Z0001 Encounter for general adult medical examination with abnormal findings: Secondary | ICD-10-CM | POA: Diagnosis not present

## 2016-07-16 DIAGNOSIS — Z1389 Encounter for screening for other disorder: Secondary | ICD-10-CM | POA: Diagnosis not present

## 2016-10-28 DIAGNOSIS — R102 Pelvic and perineal pain: Secondary | ICD-10-CM | POA: Diagnosis not present

## 2016-10-28 DIAGNOSIS — E782 Mixed hyperlipidemia: Secondary | ICD-10-CM | POA: Diagnosis not present

## 2016-10-28 DIAGNOSIS — Z6828 Body mass index (BMI) 28.0-28.9, adult: Secondary | ICD-10-CM | POA: Diagnosis not present

## 2016-10-28 DIAGNOSIS — I10 Essential (primary) hypertension: Secondary | ICD-10-CM | POA: Diagnosis not present

## 2016-10-28 DIAGNOSIS — Z1389 Encounter for screening for other disorder: Secondary | ICD-10-CM | POA: Diagnosis not present

## 2016-10-28 DIAGNOSIS — R0989 Other specified symptoms and signs involving the circulatory and respiratory systems: Secondary | ICD-10-CM | POA: Diagnosis not present

## 2016-11-08 DIAGNOSIS — R0989 Other specified symptoms and signs involving the circulatory and respiratory systems: Secondary | ICD-10-CM | POA: Diagnosis not present

## 2016-11-08 DIAGNOSIS — I723 Aneurysm of iliac artery: Secondary | ICD-10-CM | POA: Diagnosis not present

## 2016-11-08 DIAGNOSIS — E785 Hyperlipidemia, unspecified: Secondary | ICD-10-CM | POA: Diagnosis not present

## 2016-11-08 DIAGNOSIS — R162 Hepatomegaly with splenomegaly, not elsewhere classified: Secondary | ICD-10-CM | POA: Diagnosis not present

## 2016-11-08 DIAGNOSIS — I700 Atherosclerosis of aorta: Secondary | ICD-10-CM | POA: Diagnosis not present

## 2016-11-08 DIAGNOSIS — E7800 Pure hypercholesterolemia, unspecified: Secondary | ICD-10-CM | POA: Diagnosis not present

## 2016-11-08 DIAGNOSIS — I10 Essential (primary) hypertension: Secondary | ICD-10-CM | POA: Diagnosis not present

## 2016-11-08 DIAGNOSIS — Z87891 Personal history of nicotine dependence: Secondary | ICD-10-CM | POA: Diagnosis not present

## 2016-11-08 DIAGNOSIS — Z1389 Encounter for screening for other disorder: Secondary | ICD-10-CM | POA: Diagnosis not present

## 2016-11-16 ENCOUNTER — Other Ambulatory Visit: Payer: Self-pay

## 2016-11-16 DIAGNOSIS — I708 Atherosclerosis of other arteries: Secondary | ICD-10-CM

## 2016-11-16 DIAGNOSIS — I7090 Unspecified atherosclerosis: Secondary | ICD-10-CM

## 2016-12-03 DIAGNOSIS — I10 Essential (primary) hypertension: Secondary | ICD-10-CM | POA: Diagnosis not present

## 2016-12-03 DIAGNOSIS — Z1389 Encounter for screening for other disorder: Secondary | ICD-10-CM | POA: Diagnosis not present

## 2016-12-03 DIAGNOSIS — Z6828 Body mass index (BMI) 28.0-28.9, adult: Secondary | ICD-10-CM | POA: Diagnosis not present

## 2016-12-03 DIAGNOSIS — G44209 Tension-type headache, unspecified, not intractable: Secondary | ICD-10-CM | POA: Diagnosis not present

## 2016-12-03 DIAGNOSIS — J3089 Other allergic rhinitis: Secondary | ICD-10-CM | POA: Diagnosis not present

## 2016-12-21 ENCOUNTER — Encounter: Payer: Self-pay | Admitting: Vascular Surgery

## 2016-12-31 ENCOUNTER — Ambulatory Visit (INDEPENDENT_AMBULATORY_CARE_PROVIDER_SITE_OTHER): Payer: BLUE CROSS/BLUE SHIELD | Admitting: Vascular Surgery

## 2016-12-31 ENCOUNTER — Encounter: Payer: Self-pay | Admitting: Vascular Surgery

## 2016-12-31 ENCOUNTER — Ambulatory Visit (HOSPITAL_COMMUNITY)
Admission: RE | Admit: 2016-12-31 | Discharge: 2016-12-31 | Disposition: A | Discharge status: Home / Self Care | Payer: BLUE CROSS/BLUE SHIELD | Source: Ambulatory Visit | Attending: Vascular Surgery | Admitting: Vascular Surgery

## 2016-12-31 VITALS — BP 190/81 | HR 60 | Temp 97.2°F | Ht 67.0 in | Wt 179.4 lb

## 2016-12-31 DIAGNOSIS — R9439 Abnormal result of other cardiovascular function study: Secondary | ICD-10-CM | POA: Diagnosis not present

## 2016-12-31 DIAGNOSIS — I714 Abdominal aortic aneurysm, without rupture, unspecified: Secondary | ICD-10-CM

## 2016-12-31 DIAGNOSIS — I708 Atherosclerosis of other arteries: Secondary | ICD-10-CM | POA: Diagnosis not present

## 2016-12-31 DIAGNOSIS — I739 Peripheral vascular disease, unspecified: Secondary | ICD-10-CM | POA: Diagnosis not present

## 2016-12-31 DIAGNOSIS — I7090 Unspecified atherosclerosis: Secondary | ICD-10-CM

## 2016-12-31 NOTE — Progress Notes (Signed)
Patient ID: [name], female   DOB: 10-Feb-1956, 61 y.o.   MRN: [id number]  Reason for Consult: Establish Care   Referred by Redmond School, MD  Subjective:     HPI:  [name] is a 61 y.o. female without significant vascular disease. She is a former smoker having quit 5 years ago. She is also hypertensive and has hyperlipidemia. She takes aspirin and a statin drug daily. She is sent here for evaluation of calcifications of her iliac artery found while having evaluation for abdominal aortic aneurysm. At that time she was noted to have an abdominal bruit which led to the evaluation. She presents today with ABIs. She does have some issues with her legs giving out but does not have frank claudication or rest pain symptoms.  Past Medical History:  Diagnosis Date  . Hiatal hernia    small  . Hyperlipidemia   . Hypertension   . Schatzki's ring    Family History  Problem Relation Age of Onset  . Diabetes Other   . Diabetes Mother   . Heart disease Mother   . Heart disease Father   . Diabetes Sister   . Diabetes Brother   . Hypertension Brother   . Diabetes Daughter   . Hypertension Maternal Grandmother   . Stroke Maternal Grandfather   . Early death Paternal Grandfather   . Colon cancer Neg Hx   . Liver disease Neg Hx    Past Surgical History:  Procedure Laterality Date  . CHOLECYSTECTOMY    . COLONOSCOPY    06/16/2004   [id number] rectum/Diminutive polyps of the splenic flexure and the cecum/The remainder of the colonic mucosa appeared normal pathology (hyperplastic polyps).  . COLONOSCOPY N/A 08/07/2015   Procedure: COLONOSCOPY;  Surgeon: Daneil Dolin, MD;  Location[facility];  Service: Endoscopy;  Laterality: N/A;  11:30 Am  . ESOPHAGOGASTRODUODENOSCOPY  09/11/2001   [id number] ring otherwise normal esophagus/Normal stomach/Normal D1 and D2 status post passage of a 56 [place] Maloney dilator  . ESOPHAGOGASTRODUODENOSCOPY (EGD) WITH ESOPHAGEAL  DILATION N/A 01/25/2013   Dr. Gala Romney- schatzki's ring- 51 F dilation. small hiatal hernia  . TUBAL LIGATION      Short Social History:  Social History  Substance Use Topics  . Smoking status: Former Smoker    Packs/day: 0.25    Years: 35.00    Types: Cigarettes    Quit date: 12/31/2012  . Smokeless tobacco: Never Used  . Alcohol use Yes     Comment: occ    Allergies  Allergen Reactions  . Ciprofloxacin Shortness Of Breath    Current Outpatient Prescriptions  Medication Sig Dispense Refill  . acetaminophen (TYLENOL) 500 MG tablet Take 1,000 mg by mouth every 6 (six) hours as needed for pain.    Marland Kitchen ALPRAZolam (XANAX) 1 MG tablet Take 1 mg by mouth 3 (three) times daily as needed for anxiety.     Marland Kitchen amLODipine (NORVASC) 10 MG tablet Take 10 mg by mouth every morning.     Marland Kitchen aspirin EC 81 MG tablet Take 81 mg by mouth every morning.     . chlorthalidone (HYGROTON) 25 MG tablet Take 25 mg by mouth daily.    . diclofenac (VOLTAREN) 75 MG EC tablet Take 1 tablet (75 mg total) by mouth 2 (two) times daily. 14 tablet 0  . esomeprazole (NEXIUM) 20 MG capsule Take 20 mg by mouth as needed (Heartburn).     Marland Kitchen ibuprofen (ADVIL,MOTRIN) 200 MG tablet Take 800 mg  by mouth every 6 (six) hours as needed for moderate pain.    . Metoprolol Succinate 100 MG CS24 Take 100 mg by mouth daily.    . Multiple Vitamin (MULTIVITAMIN WITH MINERALS) TABS tablet Take 1 tablet by mouth daily.    . traMADol (ULTRAM) 50 MG tablet Take 1 tablet (50 mg total) by mouth every 6 (six) hours as needed. (Patient not taking: Reported on 12/31/2016) 15 tablet 0   No current facility-administered medications for this visit.     Review of Systems  Constitutional:  Constitutional negative. Eyes: Eyes negative.  Respiratory: Positive for shortness of breath.  Cardiovascular: Positive for leg swelling.  GI: Gastrointestinal negative.  Musculoskeletal: Musculoskeletal negative.  Skin: Skin negative.  Neurological: Positive  for dizziness and numbness.  Hematologic: Hematologic/lymphatic negative.  Psychiatric: Psychiatric negative.        Objective:  Objective   Vitals:   12/31/16 1046  BP: (!) 190/81  Pulse: 60  Temp: (!) 97.2 F (36.2 C)  TempSrc: Oral  SpO2: 97%  Weight: 179 lb 6.4 oz (81.4 kg)  Height: 5\' 7"  (1.702 m)   Body mass index is 28.1 kg/m.  Physical Exam  Constitutional: She is oriented to person, place, and time. She appears well-developed.  HENT:  Head: Normocephalic.  Eyes: Pupils are equal, round, and reactive to light.  Neck: Normal range of motion.  Cardiovascular: Normal rate.   Pulmonary/Chest: Effort normal.  Abdominal: Soft. She exhibits no mass.  Musculoskeletal: Normal range of motion. She exhibits no edema.  Neurological: She is alert and oriented to person, place, and time.  Skin: Skin is warm and dry.  Psychiatric: She has a normal mood and affect. Her behavior is normal. Judgment and thought content normal.    Data: I have independently interpreted her bilateral ABIs to be 0.87 on the right 0.84 on the left which are biphasic throughout.     Assessment/Plan:     61 year old female sent for calcifications of her iliac arteries found on ultrasound for abdominal aortic aneurysm. Her aneurysm was 3 cm and from that standpoint she will need to have a follow-up study in 2 years. Without sedation standpoint she is asymptomatic and is currently not smoking. I reviewed her CAT scans in 2014 with her again demonstrated the calcifications then. She artery take statin and aspirin so she is medically optimized. She should continue to walk as as tolerated. We'll have her follow-up in 2 years for repeat abdominal aortic aneurysm study but there is nothing to do at this time for the calcifications. Should she need to be seen sooner we will certainly do that.     [name] MD [facility]

## 2017-01-05 ENCOUNTER — Encounter: Payer: Self-pay | Admitting: Family Medicine

## 2017-01-11 NOTE — Addendum Note (Signed)
Addended by: Lianne Cure A on: 01/11/2017 03:46 PM   Modules accepted: Orders

## 2017-02-05 DIAGNOSIS — Z23 Encounter for immunization: Secondary | ICD-10-CM | POA: Diagnosis not present

## 2017-02-21 ENCOUNTER — Other Ambulatory Visit (HOSPITAL_COMMUNITY): Payer: Self-pay | Admitting: Family Medicine

## 2017-02-21 DIAGNOSIS — Z1231 Encounter for screening mammogram for malignant neoplasm of breast: Secondary | ICD-10-CM

## 2017-02-25 ENCOUNTER — Ambulatory Visit (HOSPITAL_COMMUNITY)
Admission: RE | Admit: 2017-02-25 | Discharge: 2017-02-25 | Disposition: A | Discharge status: Home / Self Care | Payer: BLUE CROSS/BLUE SHIELD | Source: Ambulatory Visit | Attending: Family Medicine | Admitting: Family Medicine

## 2017-02-25 DIAGNOSIS — Z1231 Encounter for screening mammogram for malignant neoplasm of breast: Secondary | ICD-10-CM | POA: Diagnosis not present

## 2017-03-14 ENCOUNTER — Ambulatory Visit (INDEPENDENT_AMBULATORY_CARE_PROVIDER_SITE_OTHER): Payer: BLUE CROSS/BLUE SHIELD | Admitting: Family Medicine

## 2017-03-14 ENCOUNTER — Other Ambulatory Visit (HOSPITAL_COMMUNITY)
Admission: RE | Admit: 2017-03-14 | Discharge: 2017-03-14 | Disposition: A | Discharge status: Home / Self Care | Payer: BLUE CROSS/BLUE SHIELD | Source: Ambulatory Visit | Attending: Family Medicine | Admitting: Family Medicine

## 2017-03-14 ENCOUNTER — Encounter: Payer: Self-pay | Admitting: Family Medicine

## 2017-03-14 VITALS — BP 182/86 | HR 82 | Temp 98.2°F | Resp 16 | Ht 67.0 in | Wt 186.2 lb

## 2017-03-14 DIAGNOSIS — E049 Nontoxic goiter, unspecified: Secondary | ICD-10-CM | POA: Diagnosis not present

## 2017-03-14 DIAGNOSIS — Z8249 Family history of ischemic heart disease and other diseases of the circulatory system: Secondary | ICD-10-CM | POA: Insufficient documentation

## 2017-03-14 DIAGNOSIS — G8929 Other chronic pain: Secondary | ICD-10-CM | POA: Insufficient documentation

## 2017-03-14 DIAGNOSIS — Z833 Family history of diabetes mellitus: Secondary | ICD-10-CM | POA: Diagnosis not present

## 2017-03-14 DIAGNOSIS — E118 Type 2 diabetes mellitus with unspecified complications: Secondary | ICD-10-CM | POA: Diagnosis not present

## 2017-03-14 DIAGNOSIS — Z87891 Personal history of nicotine dependence: Secondary | ICD-10-CM | POA: Insufficient documentation

## 2017-03-14 DIAGNOSIS — R0989 Other specified symptoms and signs involving the circulatory and respiratory systems: Secondary | ICD-10-CM

## 2017-03-14 DIAGNOSIS — Z113 Encounter for screening for infections with a predominantly sexual mode of transmission: Secondary | ICD-10-CM

## 2017-03-14 DIAGNOSIS — I714 Abdominal aortic aneurysm, without rupture: Secondary | ICD-10-CM | POA: Diagnosis not present

## 2017-03-14 DIAGNOSIS — R251 Tremor, unspecified: Secondary | ICD-10-CM | POA: Insufficient documentation

## 2017-03-14 DIAGNOSIS — E785 Hyperlipidemia, unspecified: Secondary | ICD-10-CM | POA: Insufficient documentation

## 2017-03-14 DIAGNOSIS — M25512 Pain in left shoulder: Secondary | ICD-10-CM

## 2017-03-14 DIAGNOSIS — Z79899 Other long term (current) drug therapy: Secondary | ICD-10-CM | POA: Insufficient documentation

## 2017-03-14 DIAGNOSIS — I10 Essential (primary) hypertension: Secondary | ICD-10-CM | POA: Diagnosis not present

## 2017-03-14 MED ORDER — HYDROCHLOROTHIAZIDE 25 MG PO TABS: 25.0000 mg | ORAL_TABLET | Freq: Every day | ORAL | 3 refills | Status: DC

## 2017-03-14 NOTE — Progress Notes (Signed)
Patient ID: [name], female    DOB: June 21, 1955, 61 y.o.   MRN: [id number]  Chief Complaint  Patient presents with  . Hypertension  . Hyperlipidemia  . Other    impaired fasting glucose    Allergies Ciprofloxacin  Subjective:   [name] is a 61 y.o. female who presents to [facility] today.  HPI Patient presents today as a new patient visit and for evaluation of her blood pressure.  She reports that she has had a history of hypertension for a long time and has been on multiple medications in the past.  Reports that she did not take all of her medications today.  Reports that she used to be on metoprolol XL 100 mg a day and the dose was cut back to 50 mg a day.  She also takes the valsartan/amlodipine combination agent each day.  She has not been taking her fluid pill.  She does take a potassium supplement 20 mEq every day.  She denies any chest pain, shortness of breath, or edema.  She is concerned about her blood pressure.  She denies any numbness or tingling in her extremities.  She denies any headaches or vision changes.  She reports that she believes that her neck looks larger than it used to look.  She is a little concerned about this.  Reports that she was diagnosed with a 3 cm aortic aneurysm in the past.  Has been taking an aspirin each day for this reason.  Denies any history of heart attack, stroke, or congestive heart failure.  Used to smoke but quit several years ago.  Has had a history of high cholesterol for a long time and takes the Zocor.  Denies any myalgias.  Reports that about 10 years ago was told she had a tremor and went to see another doctor.  The doctor thought she might have Parkinson's disease, however she did not believe this and did not take the medicines.  She reports that since that time she is continued to have the tremor.  She has not had any other evaluation of the tremor.  Reports that her gait is normal.  Only gets the  tremor in her hands if she tries to write or perform an activity.  She does not have a tremor at rest.  Patient reports that she was diagnosed with diabetes a long time ago and then her blood sugars normalized.  She reports she believes she was on a medication that made her blood sugars go up and after stopping that medication and losing some weight her blood sugars improved.  She reports she hopes she is not diabetic but has not had her labs checked in a long time.  Reports that she takes her Nexium each day for reflux.  She denies any drug use.  Reports she occasionally has a glass of wine or mixed drink.  She reports she is sexually active but does not use condoms.  She denies any vaginal discharge, pelvic pain, or vaginal bleeding.  She believes it would be a good idea to be screened for any infections today. Reports that she would like to be seen by an orthopedic doctor.  Has had chronic shoulder pain and now her left shoulder is bothering her.  It is worse when she tries to elevate her arms.  Has received several steroid injections in the past.       Past Medical History:  Diagnosis Date  . Abdominal aortic aneurysm (  AAA) 3.0 cm to 5.0 cm in diameter in female [facility])   . Hiatal hernia    small  . Hyperlipidemia   . Hypertension   . Schatzki's ring   . Tremor     Past Surgical History:  Procedure Laterality Date  . CHOLECYSTECTOMY    . COLONOSCOPY    06/16/2004   [id number] rectum/Diminutive polyps of the splenic flexure and the cecum/The remainder of the colonic mucosa appeared normal pathology (hyperplastic polyps).  . ESOPHAGOGASTRODUODENOSCOPY  09/11/2001   [id number] ring otherwise normal esophagus/Normal stomach/Normal D1 and D2 status post passage of a 56 [place] Maloney dilator  . TUBAL LIGATION      Family History  Problem Relation Age of Onset  . Diabetes Other   . Diabetes Mother   . Heart disease Mother   . Heart disease Father   . Diabetes Sister   .  Diabetes Brother   . Hypertension Brother   . Diabetes Daughter   . Hypertension Maternal Grandmother   . Stroke Maternal Grandfather   . Early death Paternal Grandfather   . Colon cancer Neg Hx   . Liver disease Neg Hx      Social History   Socioeconomic History  . Marital status: Single    Spouse name: None  . Number of children: 3  . Years of education: None  . Highest education level: None  Social Needs  . Financial resource strain: None  . Food insecurity - worry: None  . Food insecurity - inability: None  . Transportation needs - medical: None  . Transportation needs - non-medical: None  Occupational History  . Occupation: English as a second language teacher: [employer]  Tobacco Use  . Smoking status: Former Smoker    Packs/day: 0.25    Years: 35.00    Pack years: 8.75    Types: Cigarettes    Last attempt to quit: 12/31/2012    Years since quitting: 4.2  . Smokeless tobacco: Never Used  Substance and Sexual Activity  . Alcohol use: Yes    Comment: occasionaly  . Drug use: No  . Sexual activity: None  Other Topics Concern  . None  Social History Narrative   Eats all food groups.    Wears seatbelt.    Has 3 children.   Prior smoker.   Attends church.     Review of Systems  Constitutional: Negative for activity change, appetite change, chills, fatigue, fever and unexpected weight change.  HENT: Negative for facial swelling, trouble swallowing and voice change.   Eyes: Negative for visual disturbance.  Respiratory: Negative for cough, chest tightness and shortness of breath.   Cardiovascular: Negative for chest pain, palpitations and leg swelling.  Gastrointestinal: Negative for abdominal pain, blood in stool, constipation and diarrhea.  Endocrine: Negative for polyphagia and polyuria.  Genitourinary: Negative for dysuria, flank pain, frequency, hematuria, pelvic pain, vaginal discharge and vaginal pain.  Musculoskeletal: Positive for arthralgias. Negative  for gait problem, joint swelling, myalgias and neck stiffness.       Complains of left shoulder pain.  Occurs worse with movement.  Similar to pain she has had in her right shoulder.  Has gotten injections in her shoulder in the past.  Neurological: Positive for tremors. Negative for dizziness, syncope, facial asymmetry, weakness, light-headedness, numbness and headaches.  Hematological: Negative for adenopathy. Does not bruise/bleed easily.  Psychiatric/Behavioral: Negative for dysphoric mood. The patient is not nervous/anxious.      Objective:   BP (!) 182/86 (BP Location: Left  Arm, Patient Position: Sitting, Cuff Size: Normal)   Pulse 82   Temp 98.2 F (36.8 C) (Other (Comment))   Resp 16   Ht 5\' 7"  (1.702 m)   Wt 186 lb 4 oz (84.5 kg)   SpO2 98%   BMI 29.17 kg/m   Physical Exam  Constitutional: She is oriented to person, place, and time. She appears well-developed and well-nourished. No distress.  HENT:  Head: Normocephalic and atraumatic.  Eyes: Conjunctivae and EOM are normal. Pupils are equal, round, and reactive to light.  Neck: Normal range of motion. Neck supple. No JVD present. No tracheal deviation present. Thyromegaly present.  Overall thyroid feels slightly enlarged.  No discrete nodules palpated.  Left carotid bruit auscultated.  Cardiovascular: Normal rate, regular rhythm and normal heart sounds.  Pulmonary/Chest: Effort normal and breath sounds normal. No respiratory distress.  Lymphadenopathy:    She has no cervical adenopathy.  Neurological: She is alert and oriented to person, place, and time. She has normal strength. She displays tremor. She displays no atrophy. No cranial nerve deficit. Gait normal.  Bilateral hand tremor present with goal-directed movements.  No rest tremor appreciated.  No tremors in lower extremities appreciated.  Skin: Skin is warm and dry.  Psychiatric: She has a normal mood and affect. Her speech is normal and behavior is normal.  Judgment and thought content normal. Cognition and memory are normal. She is attentive.  Nursing note and vitals reviewed.    Assessment and Plan  1. Bruit of left carotid artery - US Carotid Duplex Bilateral; Future We will evaluate.  Patient definitely has risk factors for atherosclerotic disease.  2. Thyroid enlarged Questionable enlargement upon palpation.  Needs further evaluation. - US Soft Tissue Head/Neck; Future  3. Tremor Suspect that patient has an intention tremor.  She is already on beta-blockers.  We will check some labs at this time.  We will discuss further at next visit and see if any imaging studies are indicated.  She was counseled concerning any worrisome signs and symptoms and if they develop to please return to clinic. - TSH  4. Essential hypertension, uncontrolled Elevated systolic hypertension.  We discussed that her risk of cardiovascular and neurologic problems was increased due to her blood pressure.  We discussed that she was at an increased risk of stroke.  At this time she will hold her aspirin for the next couple days until her blood pressure is lower.  She took 1 of her metoprolol XL, 100 mg tablets that she had with her today while she was in the office.  She was not symptomatic.  Compliance with her medication was recommended.  We will add the hydrochlorothiazide at this time.  She will bring her monitor to the next office visit and she will record her readings at home.  She will follow-up in 10 days to 2 weeks for further evaluation and recheck.  She was counseled concerning worrisome signs and symptoms of stroke.  Lifestyle modifications discussed with patient including a diet emphasizing vegetables, fruits, and whole grains. Limiting intake of sodium to less than 2,400 mg per day.  Recommendations discussed include consuming low-fat dairy products, poultry, fish, legumes, non-tropical vegetable oils, and nuts; and limiting intake of sweets, sugar-sweetened  beverages, and red meat. Discussed following a plan such as the Dietary Approaches to Stop Hypertension (DASH) diet. Patient to read up on this diet.   - Basic metabolic panel - hydrochlorothiazide (HYDRODIURIL) 25 MG tablet; Take 1 tablet (25 mg total) daily  by mouth.  Dispense: 90 tablet; Refill: 3  5. Type 2 diabetes mellitus with complication, without long-term current use of insulin ([place]) Patient reports that she was diabetic in the past but believes it was due to certain medications that she was on.  She reports that her blood sugars normalized after coming off of that medication and losing weight.  We will recheck today. - Hemoglobin A1c  6. Screen for STD (sexually transmitted disease) Patient reports she is asymptomatic, however she is sexually active and does not use condoms.  Safe sex was recommended. - HIV antibody - Hepatitis panel, acute - RPR - Urine cytology ancillary only  7. Chronic left shoulder pain Patient has had reported right shoulder injections and now has left shoulder pain.  Will refer to orthopedics for management.  Due to her cardiovascular risk do not recommend NSAID therapy. - Ambulatory referral to Orthopedic Surgery  Return in about 2 weeks (around 03/28/2017) for BP. Caren Macadam, MD 03/14/2017

## 2017-03-15 LAB — TSH: TSH: 1.07 mIU/L (ref 0.40–4.50)

## 2017-03-15 LAB — BASIC METABOLIC PANEL
BUN: 11 mg/dL (ref 7–25)
CO2: 30 mmol/L (ref 20–32)
Calcium: 9.5 mg/dL (ref 8.6–10.4)
Chloride: 106 mmol/L (ref 98–110)
Creat: 0.83 mg/dL (ref 0.50–0.99)
Glucose, Bld: 106 mg/dL (ref 65–139)
Potassium: 3.7 mmol/L (ref 3.5–5.3)
Sodium: 142 mmol/L (ref 135–146)

## 2017-03-15 LAB — HEMOGLOBIN A1C
Hgb A1c MFr Bld: 6.1 % of total Hgb — ABNORMAL HIGH (ref <5.7)
Mean Plasma Glucose: 128 (calc)
eAG (mmol/L): 7.1 (calc)

## 2017-03-15 LAB — HEPATITIS PANEL, ACUTE
Hep A IgM: NONREACTIVE
Hep B C IgM: NONREACTIVE
Hepatitis B Surface Ag: NONREACTIVE
Hepatitis C Ab: NONREACTIVE
SIGNAL TO CUT-OFF: 0.01 (ref <1.00)

## 2017-03-15 LAB — RPR: RPR Ser Ql: NONREACTIVE

## 2017-03-15 LAB — HIV ANTIBODY (ROUTINE TESTING W REFLEX): HIV 1&2 Ab, 4th Generation: NONREACTIVE

## 2017-03-15 NOTE — Addendum Note (Signed)
Addended by: Caren Macadam on: 03/15/2017 12:02 PM   Modules accepted: Orders

## 2017-03-16 LAB — URINE CYTOLOGY ANCILLARY ONLY
Chlamydia: NEGATIVE
Neisseria Gonorrhea: NEGATIVE
Trichomonas: NEGATIVE

## 2017-03-18 ENCOUNTER — Ambulatory Visit (HOSPITAL_COMMUNITY)
Admission: RE | Admit: 2017-03-18 | Discharge: 2017-03-18 | Disposition: A | Discharge status: Home / Self Care | Payer: BLUE CROSS/BLUE SHIELD | Source: Ambulatory Visit | Attending: Family Medicine | Admitting: Family Medicine

## 2017-03-18 ENCOUNTER — Telehealth: Payer: Self-pay | Admitting: Family Medicine

## 2017-03-18 DIAGNOSIS — E049 Nontoxic goiter, unspecified: Secondary | ICD-10-CM | POA: Insufficient documentation

## 2017-03-18 DIAGNOSIS — R0989 Other specified symptoms and signs involving the circulatory and respiratory systems: Secondary | ICD-10-CM

## 2017-03-18 DIAGNOSIS — E010 Iodine-deficiency related diffuse (endemic) goiter: Secondary | ICD-10-CM | POA: Diagnosis not present

## 2017-03-18 DIAGNOSIS — I6523 Occlusion and stenosis of bilateral carotid arteries: Secondary | ICD-10-CM | POA: Diagnosis not present

## 2017-03-18 LAB — URINE CYTOLOGY ANCILLARY ONLY: Candida vaginitis: NEGATIVE

## 2017-03-18 MED ORDER — METRONIDAZOLE 500 MG PO TABS: 500.0000 mg | ORAL_TABLET | Freq: Two times a day (BID) | ORAL | 0 refills | Status: DC

## 2017-03-18 NOTE — Telephone Encounter (Signed)
Please call patient and advised that her laboratory testing did reveal she had infection with bacterial vaginosis.  Advised her that she needs to take the medication, metronidazole 500 mg, 1 p.o. twice daily times 7 days.  Please inform her that she may not drink alcohol while taking this medicine or for 2 days after discontinuing this medication or she will become very sick with nausea and vomiting.  Please advise her that her gonorrhea and Chlamydia testing was negative.

## 2017-03-18 NOTE — Telephone Encounter (Signed)
Patient informed of message below, verbalized understanding.  

## 2017-03-22 ENCOUNTER — Telehealth: Payer: Self-pay | Admitting: Family Medicine

## 2017-03-22 MED ORDER — METRONIDAZOLE 500 MG PO TABS: 500.0000 mg | ORAL_TABLET | Freq: Two times a day (BID) | ORAL | 0 refills | Status: DC

## 2017-03-22 NOTE — Telephone Encounter (Signed)
Medication resent

## 2017-03-22 NOTE — Telephone Encounter (Signed)
Patient states [facility] never received an rx for her infection as discussed after labwork. Cb#: [phone]

## 2017-03-24 ENCOUNTER — Telehealth: Payer: Self-pay | Admitting: Family Medicine

## 2017-03-24 DIAGNOSIS — I6523 Occlusion and stenosis of bilateral carotid arteries: Secondary | ICD-10-CM

## 2017-03-24 NOTE — Telephone Encounter (Signed)
Patient informed of message below, verbalized understanding. She states she is taking medication as directed. Will keep follow up

## 2017-03-24 NOTE — Telephone Encounter (Signed)
Please call patient and advised that the ultrasound of her carotid arteries did reveal a narrowing of moderate degree.  She needs to have evaluation by a vascular surgeon to discuss whether surgery would decrease her risk of heart attack and stroke.  I will place the referral.  In addition, her thyroid was slightly enlarged.  We will discuss the ultrasound at her follow-up office visit.  Please advise her to make sure she is taking her medications for her blood pressure and to please keep her scheduled follow-up for recheck of her blood pressure and to discuss her labs and ultrasounds.

## 2017-03-28 ENCOUNTER — Encounter: Payer: Self-pay | Admitting: Family Medicine

## 2017-03-28 ENCOUNTER — Other Ambulatory Visit: Payer: Self-pay

## 2017-03-28 ENCOUNTER — Ambulatory Visit: Payer: BLUE CROSS/BLUE SHIELD | Admitting: Family Medicine

## 2017-03-28 VITALS — BP 158/82 | HR 76 | Temp 97.7°F | Resp 16 | Ht 67.0 in | Wt 183.0 lb

## 2017-03-28 DIAGNOSIS — Z23 Encounter for immunization: Secondary | ICD-10-CM

## 2017-03-28 DIAGNOSIS — I6523 Occlusion and stenosis of bilateral carotid arteries: Secondary | ICD-10-CM | POA: Diagnosis not present

## 2017-03-28 DIAGNOSIS — I10 Essential (primary) hypertension: Secondary | ICD-10-CM

## 2017-03-28 DIAGNOSIS — R7303 Prediabetes: Secondary | ICD-10-CM

## 2017-03-28 DIAGNOSIS — E119 Type 2 diabetes mellitus without complications: Secondary | ICD-10-CM

## 2017-03-28 DIAGNOSIS — E785 Hyperlipidemia, unspecified: Secondary | ICD-10-CM | POA: Diagnosis not present

## 2017-03-28 LAB — LIPID PANEL
Cholesterol: 175 mg/dL (ref <200)
HDL: 65 mg/dL (ref >50)
LDL Cholesterol (Calc): 91 mg/dL (calc)
Non-HDL Cholesterol (Calc): 110 mg/dL (calc) (ref <130)
Total CHOL/HDL Ratio: 2.7 (calc) (ref <5.0)
Triglycerides: 95 mg/dL (ref <150)

## 2017-03-28 MED ORDER — METOPROLOL TARTRATE 100 MG PO TABS: 100.0000 mg | ORAL_TABLET | Freq: Two times a day (BID) | ORAL | 1 refills | Status: DC

## 2017-03-28 MED ORDER — METFORMIN HCL 500 MG PO TABS: 500.0000 mg | ORAL_TABLET | Freq: Two times a day (BID) | ORAL | 0 refills | Status: DC

## 2017-03-28 NOTE — Patient Instructions (Signed)

## 2017-03-28 NOTE — Progress Notes (Signed)
Patient ID: [name], female    DOB: 1956-05-01, 61 y.o.   MRN: [id number]  Chief Complaint  Patient presents with  . Follow-up  . Hypertension    Allergies Ciprofloxacin  Subjective:   [name] is a 61 y.o. female who presents to [facility] today.  HPI Ms. Cunanan presents today for follow-up of her blood work and to discuss her lab testing, carotid ultrasound, and thyroid ultrasound.  She has been checking her blood pressures at home and they have been running in the 130s-150s over 70s-90s.  Has been taking her medications as directed other than she did not increase the metoprolol to twice a day.  She has been taking metoprolol 100 mg once in the evening.  She reports it has helped with her tremor.  Reports her blood pressures have been lower.  Reports she feels better.  No side effects.  She denies any chest pain or shortness of breath.  Has a history of hyperlipidemia and abdominal aortic aneurysm.  Is taking her simvastatin each night.  Denies any myalgias.  He comes in fasting today so this can be checked to see if her LDL is at goal.  Had recent carotid ultrasound secondary to hearing a bruit in the office which she would like to discuss.  She reports that her daughter is a Marine scientist and she would like for her to be on the phone when we discussed this.  She called her daughter Adonis Huguenin who was on speaker phone as we discussed her results.  Her daughter reports that she would like her to see a specific vascular surgeon.  Thyroid ultrasound was also reviewed by her daughter was on the phone.  Patient denies any palpitations.  Has never had a problem with her thyroid in the past.  She no longer smokes.  Reports occasional alcohol use.  Reportedly patient was diabetic years ago secondary to medication she was placed on and was on oral agents and insulin.  Patient reports that she came off the medicine and then subsequently came off insulin and diabetic meds.   Patient denies any lesions on her feet.  Is here to discuss recent hemoglobin A1c of 6.1%. Patient exercises several times a week.  Reports she had a pneumonia shot last.  We do not have her immunization records.  She reports it is been over 10 years since she has had a tetanus shot.   Hypertension  This is a chronic problem. The current episode started more than 1 year ago. The problem has been waxing and waning since onset. The problem is uncontrolled. Pertinent negatives include no anxiety, blurred vision, chest pain, headaches, malaise/fatigue, neck pain, orthopnea, palpitations, peripheral edema, PND, shortness of breath or sweats. There are no associated agents to hypertension. Risk factors for coronary artery disease include diabetes mellitus, dyslipidemia, family history, post-menopausal state and stress. Past treatments include ACE inhibitors, angiotensin blockers, calcium channel blockers, diuretics and lifestyle changes. The current treatment provides moderate improvement. There are no compliance problems.  There is no history of angina, kidney disease, CAD/MI or CVA.    Past Medical History:  Diagnosis Date  . Abdominal aortic aneurysm (AAA) 3.0 cm to 5.0 cm in diameter in female [facility])   . Hiatal hernia    small  . Hyperlipidemia   . Hypertension   . Schatzki's ring   . Tremor     Past Surgical History:  Procedure Laterality Date  . CHOLECYSTECTOMY    . COLONOSCOPY  06/16/2004   [id number] rectum/Diminutive polyps of the splenic flexure and the cecum/The remainder of the colonic mucosa appeared normal pathology (hyperplastic polyps).  . COLONOSCOPY N/A 08/07/2015   Performed by Daneil Dolin, MD at [facility]  . ESOPHAGOGASTRODUODENOSCOPY  09/11/2001   [id number] ring otherwise normal esophagus/Normal stomach/Normal D1 and D2 status post passage of a 56 [place] Maloney dilator  . ESOPHAGOGASTRODUODENOSCOPY (EGD) WITH ESOPHAGEAL DILATION N/A 01/25/2013   Performed by  Daneil Dolin, MD at [facility]  . TUBAL LIGATION      Family History  Problem Relation Age of Onset  . Diabetes Other   . Diabetes Mother   . Heart disease Mother   . Heart disease Father   . Diabetes Sister   . Diabetes Brother   . Hypertension Brother   . Diabetes Daughter   . Hypertension Maternal Grandmother   . Stroke Maternal Grandfather   . Early death Paternal Grandfather   . Colon cancer Neg Hx   . Liver disease Neg Hx      Social History   Socioeconomic History  . Marital status: Single    Spouse name: None  . Number of children: 3  . Years of education: None  . Highest education level: None  Social Needs  . Financial resource strain: None  . Food insecurity - worry: None  . Food insecurity - inability: None  . Transportation needs - medical: None  . Transportation needs - non-medical: None  Occupational History  . Occupation: English as a second language teacher: [employer]  Tobacco Use  . Smoking status: Former Smoker    Packs/day: 0.25    Years: 35.00    Pack years: 8.75    Types: Cigarettes    Last attempt to quit: 12/31/2012    Years since quitting: 4.2  . Smokeless tobacco: Never Used  Substance and Sexual Activity  . Alcohol use: Yes    Comment: occasionaly  . Drug use: No  . Sexual activity: None  Other Topics Concern  . None  Social History Narrative   Eats all food groups.    Wears seatbelt.    Has 3 children.   Prior smoker.   Attends church.    Current Outpatient Medications on File Prior to Visit  Medication Sig Dispense Refill  . acetaminophen (TYLENOL) 500 MG tablet Take 1,000 mg by mouth every 6 (six) hours as needed for pain.    Marland Kitchen ALPRAZolam (XANAX) 1 MG tablet Take 1 mg by mouth 3 (three) times daily as needed for anxiety.     Marland Kitchen amLODipine-valsartan (EXFORGE) 10-320 MG tablet TK 1 T PO D  1  . esomeprazole (NEXIUM) 20 MG capsule Take 20 mg by mouth as needed (Heartburn).     . hydrochlorothiazide (HYDRODIURIL) 25 MG  tablet Take 1 tablet (25 mg total) daily by mouth. 90 tablet 3  . ibuprofen (ADVIL,MOTRIN) 200 MG tablet Take 800 mg by mouth every 6 (six) hours as needed for moderate pain.    . metroNIDAZOLE (FLAGYL) 500 MG tablet Take 1 tablet (500 mg total) 2 (two) times daily by mouth. 14 tablet 0  . Multiple Vitamin (MULTIVITAMIN WITH MINERALS) TABS tablet Take 1 tablet by mouth daily.    Marland Kitchen AFLURIA QUADRIVALENT 0.5 ML injection ADM 0.5ML IM UTD  0  . aspirin EC 81 MG tablet Take 81 mg by mouth every morning.     . simvastatin (ZOCOR) 40 MG tablet TK 1 T PO  QD  2  No current facility-administered medications on file prior to visit.     Review of Systems  Constitutional: Negative for fatigue, fever, malaise/fatigue and unexpected weight change.  HENT: Negative for trouble swallowing and voice change.   Eyes: Negative for blurred vision and visual disturbance.  Respiratory: Negative for chest tightness and shortness of breath.   Cardiovascular: Negative for chest pain, palpitations, orthopnea and PND.  Gastrointestinal: Negative for abdominal pain, constipation, nausea and vomiting.  Endocrine: Negative for polydipsia, polyphagia and polyuria.  Genitourinary: Negative for frequency and urgency.       Taking medication now to complete treatment for bacterial vaginosis.  Musculoskeletal: Negative for neck pain.       Still has pain in her shoulder but has upcoming appointment for orthopedic doctor.  Skin: Negative for rash.  Neurological: Negative for dizziness, tremors, seizures, syncope, facial asymmetry and headaches.  Hematological: Negative for adenopathy. Does not bruise/bleed easily.  Psychiatric/Behavioral: Negative for dysphoric mood. The patient is not nervous/anxious.      Objective:   BP (!) 158/82 (BP Location: Left Arm, Patient Position: Sitting, Cuff Size: Normal)   Pulse 76   Temp 97.7 F (36.5 C) (Other (Comment))   Resp 16   Ht 5\' 7"  (1.702 m)   Wt 183 lb (83 kg)   SpO2  97%   BMI 28.66 kg/m   Physical Exam  Constitutional: She is oriented to person, place, and time. She appears well-developed and well-nourished. No distress.  HENT:  Head: Normocephalic and atraumatic.  Eyes: Pupils are equal, round, and reactive to light.  Neck: Normal range of motion. Neck supple. No JVD present. No tracheal deviation present.  Cardiovascular: Normal rate, regular rhythm and normal heart sounds.  Pulmonary/Chest: Effort normal and breath sounds normal. No respiratory distress.  Abdominal: Soft. Bowel sounds are normal. She exhibits no distension.  Neurological: She is alert and oriented to person, place, and time. No cranial nerve deficit.  Persistent tremor present, but improved from last visit.  Skin: Skin is warm and dry.  Psychiatric: She has a normal mood and affect. Her behavior is normal. Judgment and thought content normal.  Nursing note and vitals reviewed.    Assessment and Plan  1. Carotid stenosis, asymptomatic, bilateral Discussed with patient and her daughter today the need for evaluation by vascular surgery.  Continue with lifestyle modifications and preventive measures.  P.o. daily. - Ambulatory referral to Vascular Surgery  2. HTN, goal below 140/90 Improved from prior visit.  Increase the metoprolol to 1 p.o. twice daily.  Prescription sent in.  Follow-up in 1 month - metoprolol tartrate (LOPRESSOR) 100 MG tablet; Take 1 tablet (100 mg total) 2 (two) times daily by mouth.  Dispense: 180 tablet; Refill: 1 Lifestyle modifications discussed with patient including a diet emphasizing vegetables, fruits, and whole grains. Limiting intake of sodium to less than 2,400 mg per day.  Recommendations discussed include consuming low-fat dairy products, poultry, fish, legumes, non-tropical vegetable oils, and nuts; and limiting intake of sweets, sugar-sweetened beverages, and red meat. Discussed following a plan such as the Dietary Approaches to Stop Hypertension  (DASH) diet. Patient to read up on this diet.   3. Hyperlipidemia, unspecified hyperlipidemia type Continue simvastatin.  Will make sure her LDL is at goal. - Lipid panel  4. Type 2 diabetes mellitus without complication, without long-term current use of insulin (HCC) Diet, exercise, and weight discussed today.  Patient was asked to decrease her sugar intake, sweets, and carbohydrate intake.  She reports that  she likes sweets, ice cream and bread. - metFORMIN (GLUCOPHAGE) 500 MG tablet; Take 1 tablet (500 mg total) 2 (two) times daily with a meal by mouth.  Dispense: 90 tablet; Refill: 0 - Ambulatory referral to Ophthalmology Will check foot exam at follow-up. Patient defers diabetic education at this time. Patient counseled in detail regarding the risks of medication. Told to call or return to clinic if develop any worrisome signs or symptoms. Patient voiced understanding.   5.  Immunization due. Request immunization record from prior PCP. Tetanus shot administered today. Return in about 4 weeks (around 04/25/2017) for bp. Caren Macadam, MD 03/28/2017

## 2017-03-29 ENCOUNTER — Encounter: Payer: Self-pay | Admitting: Family Medicine

## 2017-03-29 ENCOUNTER — Telehealth: Payer: Self-pay | Admitting: *Deleted

## 2017-03-29 NOTE — Telephone Encounter (Signed)
Patient informed of message below, verbalized understanding.  

## 2017-03-29 NOTE — Telephone Encounter (Signed)
Patient called left message stating the medicine that Dr Mannie Stabile put her on yesterday is causing her to have headaches, flushing, and to be light headed. Please advise

## 2017-03-29 NOTE — Telephone Encounter (Signed)
Please call patient's pharmacy and refill the metoprolol 50 mg 1 p.o. twice daily, that she was previously on.  Dispense #180, no refills.  Advise her to monitor her blood pressure and hold her blood pressure medicines until her blood pressure is greater than 140/90.  If she is feeling dizzy, lightheaded, or having any worrisome symptoms please advised her to go to the urgent care or the ED for evaluation.  Please advise her to continue to monitor her blood pressure and follow-up for an office visit.

## 2017-03-29 NOTE — Telephone Encounter (Signed)
Please call patient and find out how she is feeling and what is going on.  Please asked specifically what medication she is talking about just so that we can all be on the same page.  Thank you

## 2017-03-29 NOTE — Telephone Encounter (Signed)
Patient states she is talking about the metoprolol. She states she was given 'metoprolol tartrate' and she used to take 'plain metoprolol.' She states she took it for the first time last night. She states that she woke up around 1 am this morning cold and shaking and her BP was 98/54. It is 102/52 now.

## 2017-04-06 ENCOUNTER — Ambulatory Visit: Payer: BLUE CROSS/BLUE SHIELD | Admitting: Orthopedic Surgery

## 2017-04-06 ENCOUNTER — Encounter: Payer: Self-pay | Admitting: Orthopedic Surgery

## 2017-04-06 ENCOUNTER — Ambulatory Visit (INDEPENDENT_AMBULATORY_CARE_PROVIDER_SITE_OTHER): Payer: BLUE CROSS/BLUE SHIELD

## 2017-04-06 VITALS — BP 190/73 | HR 61 | Ht 67.0 in | Wt 176.0 lb

## 2017-04-06 DIAGNOSIS — M25512 Pain in left shoulder: Secondary | ICD-10-CM

## 2017-04-06 DIAGNOSIS — G8929 Other chronic pain: Secondary | ICD-10-CM

## 2017-04-06 DIAGNOSIS — M19019 Primary osteoarthritis, unspecified shoulder: Secondary | ICD-10-CM

## 2017-04-06 MED ORDER — MELOXICAM 7.5 MG PO TABS: 7.5000 mg | ORAL_TABLET | Freq: Every day | ORAL | 2 refills | Status: DC

## 2017-04-06 NOTE — Progress Notes (Signed)
NEW PATIENT OFFICE VISIT    Chief Complaint  Patient presents with  . Shoulder Pain    left     61 year old female with a 1 year history of left shoulder pain.  She denies any trauma she has not had any treatment.  She complains of anterior shoulder pain along the joint line radiates into the forearm with painful range of motion especially abduction.  It is a dull sharp intermittent pain which is worse at night.  There are no other associated symptoms pain is mild to moderate except at night where it becomes severe    Review of Systems  Neurological: Positive for tingling and focal weakness. Negative for sensory change.  All other systems reviewed and are negative.    Past Medical History:  Diagnosis Date  . Abdominal aortic aneurysm (AAA) 3.0 cm to 5.0 cm in diameter in female [facility])   . Hiatal hernia    small  . Hyperlipidemia   . Hypertension   . Schatzki's ring   . Tremor     Past Surgical History:  Procedure Laterality Date  . CHOLECYSTECTOMY    . COLONOSCOPY    06/16/2004   [id number] rectum/Diminutive polyps of the splenic flexure and the cecum/The remainder of the colonic mucosa appeared normal pathology (hyperplastic polyps).  . COLONOSCOPY N/A 08/07/2015   Procedure: COLONOSCOPY;  Surgeon: Daneil Dolin, MD;  Location: [facility];  Service: Endoscopy;  Laterality: N/A;  11:30 Am  . ESOPHAGOGASTRODUODENOSCOPY  09/11/2001   [id number] ring otherwise normal esophagus/Normal stomach/Normal D1 and D2 status post passage of a 56 [place] Maloney dilator  . ESOPHAGOGASTRODUODENOSCOPY (EGD) WITH ESOPHAGEAL DILATION N/A 01/25/2013   Dr. Gala Romney- schatzki's ring- 9 F dilation. small hiatal hernia  . TUBAL LIGATION      Family History  Problem Relation Age of Onset  . Diabetes Other   . Diabetes Mother   . Heart disease Mother   . Heart disease Father   . Diabetes Sister   . Diabetes Brother   . Hypertension Brother   . Diabetes Daughter   . Hypertension  Maternal Grandmother   . Stroke Maternal Grandfather   . Early death Paternal Grandfather   . Colon cancer Neg Hx   . Liver disease Neg Hx    Social History   Tobacco Use  . Smoking status: Former Smoker    Packs/day: 0.25    Years: 35.00    Pack years: 8.75    Types: Cigarettes    Last attempt to quit: 12/31/2012    Years since quitting: 4.2  . Smokeless tobacco: Never Used  Substance Use Topics  . Alcohol use: Yes    Comment: occasionaly  . Drug use: No    Allergies  Allergen Reactions  . Ciprofloxacin Shortness Of Breath     Current Meds  Medication Sig  . ALPRAZolam (XANAX) 1 MG tablet Take 1 mg by mouth 3 (three) times daily as needed for anxiety.   Marland Kitchen amLODipine-valsartan (EXFORGE) 10-320 MG tablet TK 1 T PO D  . aspirin EC 81 MG tablet Take 81 mg by mouth every morning.   Marland Kitchen esomeprazole (NEXIUM) 20 MG capsule Take 20 mg by mouth as needed (Heartburn).   . hydrochlorothiazide (HYDRODIURIL) 25 MG tablet Take 1 tablet (25 mg total) daily by mouth.  . metoprolol succinate (TOPROL-XL) 50 MG 24 hr tablet TK 1 T PO ONCE DAILY  . Multiple Vitamin (MULTIVITAMIN WITH MINERALS) TABS tablet Take 1 tablet by mouth daily.  Marland Kitchen  simvastatin (ZOCOR) 40 MG tablet TK 1 T PO  QD    BP (!) 190/73   Pulse 61   Ht 5\' 7"  (1.702 m)   Wt 176 lb (79.8 kg)   BMI 27.57 kg/m   Physical Exam  Constitutional: She is oriented to person, place, and time. She appears well-developed and well-nourished.  Musculoskeletal:       Right shoulder: She exhibits normal range of motion, no tenderness, no bony tenderness, no swelling, no effusion, no crepitus, no deformity, no laceration, no pain, no spasm and normal strength.       Left shoulder: She exhibits tenderness, pain and decreased strength. She exhibits normal range of motion, no bony tenderness, no swelling, no effusion, no crepitus, no deformity, no laceration, no spasm and normal pulse.       Arms: Neurological: She is alert and oriented to  person, place, and time. She displays no atrophy and no tremor. No sensory deficit. She exhibits normal muscle tone. She displays no seizure activity. Coordination and gait normal.  Reflex Scores:      Tricep reflexes are 2+ on the right side and 2+ on the left side.      Bicep reflexes are 2+ on the right side and 2+ on the left side.      Brachioradialis reflexes are 2+ on the right side and 2+ on the left side. HOFFMANS NORMAL EACH SIDE   Psychiatric: She has a normal mood and affect. Judgment normal.  Vitals reviewed.   Back Exam   Tenderness  The patient is experiencing tenderness in the cervical.  Range of Motion  Extension: normal  Flexion: normal  Lateral bend left: normal  Rotation left: normal      X-ray was taken today left shoulder.  Mild glenohumeral arthritis see separate report  Recommend physical therapy to increase strength and rotator cuff Meds ordered this encounter  Medications  . meloxicam (MOBIC) 7.5 MG tablet    Sig: Take 1 tablet (7.5 mg total) by mouth daily.    Dispense:  30 tablet    Refill:  2    Encounter Diagnoses  Name Primary?  . Chronic left shoulder pain Yes  . Arthritis, shoulder region      PLAN:   Start anti-inflammatory medication, complete physical therapy return in 8 weeks

## 2017-04-06 NOTE — Patient Instructions (Addendum)
[facility]  Information on Shoulder Joint Replacement Shoulder joint replacement is surgery to replace damaged parts of the shoulder joint with artificial parts (prostheses). Two parts may be used to replace this joint:  The humeral component replaces the head of the upper arm bone (humerus). This is a rounded ball that is attached to a stem that fits into the humerus.  The glenoid component replaces the socket (glenoid depression).  The prostheses are usually made of metal and plastic. Depending on the damage to your shoulder, the surgeon may replace just the humeral head (hemiarthroplasty) or replace both the humeral head and the glenoid (total shoulder replacement). The surrounding muscles and tendons hold the prosthetic parts in place. This procedure may be done to relieve joint pain or to treat severe shoulder fractures or arthritis. This surgery may be done if other non-surgical treatments have not worked. Tell a health care provider about:  Any allergies you have.  All medicines you are taking, including vitamins, herbs, eye drops, creams, and over-the-counter medicines.  Any problems you or family members have had with anesthetic medicines.  Any blood disorders you have.  Any surgeries you have had.  Any medical conditions you have.  Whether you are pregnant or may be pregnant. What are the risks? Generally, this is a safe procedure. However, problems may occur, including:  Infection.  Bleeding.  Allergic reactions to medicines.  Damage to other structures, organs, or nerves.  Fracture of the upper arm bone during or after surgery.  Instability of the shoulder after surgery.  Loosening of the glenoid component over time.  Unusual bone growth.  Failure of bone healing after surgery.  What happens before the procedure? Medicines  Ask your health care provider about: ? Changing or stopping your regular medicines. This is especially important if you  are taking diabetes medicines or blood thinners. ? Taking medicines such as aspirin and ibuprofen. These medicines can thin your blood. Do not take these medicines before your procedure if your health care provider instructs you not to.  You may be given antibiotic medicine to help prevent infection. Staying hydrated Follow instructions from your health care provider about hydration, which may include:  Up to 2 hours before the procedure - you may continue to drink clear liquids, such as water, clear fruit juice, black coffee, and plain tea.  Eating and drinking restrictions Follow instructions from your health care provider about eating and drinking, which may include:  8 hours before the procedure - stop eating heavy meals or foods such as meat, fried foods, or fatty foods.  6 hours before the procedure - stop eating light meals or foods, such as toast or cereal.  6 hours before the procedure - stop drinking milk or drinks that contain milk.  2 hours before the procedure - stop drinking clear liquids.  General instructions  Plan to have someone take you home from the hospital or clinic.  Plan to have someone with you for 24 hours after the procedure. It is also recommended that you have someone to assist you at home for the first few weeks after the procedure.  Do not use any products that contain nicotine or tobacco, such as cigarettes and e-cigarettes. If you need help quitting, ask your healthcare provider.  Ask your health care provider how your surgical site will be marked or identified. What happens during the procedure?  To reduce your risk of infection: ? Your health care team will wash or sanitize  their hands. ? Your skin will be washed with soap. ? Hair may be removed from the surgical area.  An IV tube will be inserted into one of your veins.  You will be given one or more of the following: ? A medicine to help you relax (sedative). ? A medicine to make you fall  asleep (general anesthetic). ? A medicine that is injected into your shoulder area to numb everything around the injection site (regional anesthetic).  An incision will be made on the front of the shoulder from the collarbone (clavicle) to the point where the shoulder muscle (deltoid) attaches to the upper arm bone.  The upper arm bone will be removed from the socket to expose the ball-like end of the upper arm.  The center cavity of the humerus bone will be cleaned and enlarged to create a hollow area that matches the shape of the implant stem. The top end of the bone will be smoothed so the stem will be level with the bone surface when it is inserted.  If the ball of the prosthesis is a separate piece, the proper size will be selected and attached.  If the socket portion of the joint is healthy and the surrounding muscles are in good condition, the surgeon may decide not to replace it. However, if the socket needs to be replaced: ? The surgeon will prepare the socket surface by removing the remaining damaged cartilage. ? The socket bone will be gently reshaped to fit the implant. ? The glenoid component will be implanted and cemented into position.  The arm bone, with its new artificial head, will be replaced in the socket. The surgeon will reattach the supporting tendons and close the incision with sutures or stitches.  A bandage (dressing) will be placed over your incision.  Your arm will be placed in a sling or immobilizer, and a support pillow will be placed under your elbow.  Tubes will be placed to remove excess drainage. These are usually removed after a couple of days. The procedure may vary among health care providers and hospitals. What happens after the procedure?  Your blood pressure, heart rate, breathing rate, and blood oxygen level will be monitored until the medicines you were given have worn off.  Your arm will be numb if you were given a regional anesthetic. This may  last until the next day.  You will be given pain medicine as needed.  Your arm and shoulder will be stiff and bruised. This will improve over time.  An icing device will be placed around your shoulder. This helps to control pain and swelling.  Your arm will be in a sling or immobilizer. You will need to wear this for 2-4 weeks after surgery or as told by your health care provider.  Your health care team may begin to show you exercises for your shoulder.  Do not use your arm to push yourself up in bed or from a chair.  Do not lift anything that is heavier than a cup of coffee.  Do not drive for 24 hours if you were given a sedative. Ask your health care provider when it is safe for you to drive. Summary  Shoulder joint replacement is surgery to replace damaged parts of the shoulder joint with artificial parts (prostheses).  The surgeon may replace just the humeral head (hemiarthroplasty) or replace both the humeral head and the glenoid (total shoulder replacement), based on the damage to your shoulder.  Taking medicine, icing the  painful area, and doing exercises as told by your health care provider will help control your shoulder pain, swelling, and stiffness after surgery.  After the procedure, do not lift anything that is heavier than a cup of coffee and do not use your arm to push yourself up in bed or from a chair. This information is not intended to replace advice given to you by your health care provider. Make sure you discuss any questions you have with your health care provider. Document Released: 01/23/2003 Document Revised: 02/09/2016 Document Reviewed: 02/09/2016 Elsevier Interactive Patient Education  2018 [employer].

## 2017-04-15 DIAGNOSIS — M25612 Stiffness of left shoulder, not elsewhere classified: Secondary | ICD-10-CM | POA: Diagnosis not present

## 2017-04-15 DIAGNOSIS — M1990 Unspecified osteoarthritis, unspecified site: Secondary | ICD-10-CM | POA: Diagnosis not present

## 2017-04-15 DIAGNOSIS — M6281 Muscle weakness (generalized): Secondary | ICD-10-CM | POA: Diagnosis not present

## 2017-04-15 DIAGNOSIS — M25512 Pain in left shoulder: Secondary | ICD-10-CM | POA: Diagnosis not present

## 2017-04-19 DIAGNOSIS — M25612 Stiffness of left shoulder, not elsewhere classified: Secondary | ICD-10-CM | POA: Diagnosis not present

## 2017-04-19 DIAGNOSIS — M1990 Unspecified osteoarthritis, unspecified site: Secondary | ICD-10-CM | POA: Diagnosis not present

## 2017-04-19 DIAGNOSIS — M6281 Muscle weakness (generalized): Secondary | ICD-10-CM | POA: Diagnosis not present

## 2017-04-19 DIAGNOSIS — M25512 Pain in left shoulder: Secondary | ICD-10-CM | POA: Diagnosis not present

## 2017-04-20 DIAGNOSIS — M1990 Unspecified osteoarthritis, unspecified site: Secondary | ICD-10-CM | POA: Diagnosis not present

## 2017-04-20 DIAGNOSIS — M6281 Muscle weakness (generalized): Secondary | ICD-10-CM | POA: Diagnosis not present

## 2017-04-20 DIAGNOSIS — M25612 Stiffness of left shoulder, not elsewhere classified: Secondary | ICD-10-CM | POA: Diagnosis not present

## 2017-04-20 DIAGNOSIS — M25512 Pain in left shoulder: Secondary | ICD-10-CM | POA: Diagnosis not present

## 2017-04-21 ENCOUNTER — Ambulatory Visit: Payer: BLUE CROSS/BLUE SHIELD | Admitting: Vascular Surgery

## 2017-04-21 ENCOUNTER — Encounter: Payer: Self-pay | Admitting: Vascular Surgery

## 2017-04-21 ENCOUNTER — Other Ambulatory Visit: Payer: Self-pay

## 2017-04-21 VITALS — BP 150/79 | HR 76 | Temp 97.9°F | Resp 16 | Ht 67.0 in | Wt 183.0 lb

## 2017-04-21 DIAGNOSIS — I6523 Occlusion and stenosis of bilateral carotid arteries: Secondary | ICD-10-CM | POA: Diagnosis not present

## 2017-04-21 NOTE — Progress Notes (Signed)
HISTORY AND PHYSICAL     CC:  Follow up after carotid duplex Requesting Provider:  Caren Macadam, MD  HPI: This is a 61 y.o. female who went to see her PCP and left carotid bruit was heard.  She was sent for a carotid duplex, which revealed 50-69% stenosis of the ICA bilaterally.  She denies any weakness of her extremities, she has not had any speech difficulty and denies any temporary blindness.   She quit smoking about 5 years ago.  She does take an aspirin and statin daily.  She is on CCB/ARB and an ACEI for blood pressure management.  She is on oral agents for diabetes.  She had seen Dr. 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  At that time, she did have some issues with her legs being tired.  She states that she has started to exercise and feels her legs aren't as tired as they have been in the past.   Past Medical History:  Diagnosis Date  . Abdominal aortic aneurysm (AAA) 3.0 cm to 5.0 cm in diameter in female [facility])   . Hiatal hernia    small  . Hyperlipidemia   . Hypertension   . Schatzki's ring   . Tremor     Past Surgical History:  Procedure Laterality Date  . CHOLECYSTECTOMY    . COLONOSCOPY    06/16/2004   EGB:TDVVOH rectum/Diminutive polyps of the splenic flexure and the cecum/The remainder of the colonic mucosa appeared normal pathology (hyperplastic polyps).  . COLONOSCOPY N/A 08/07/2015   Procedure: COLONOSCOPY;  Surgeon: Daneil Dolin, MD;  Location: [facility];  Service: Endoscopy;  Laterality: N/A;  11:30 Am  . ESOPHAGOGASTRODUODENOSCOPY  09/11/2001   YWV:PXTGGYIRS ring otherwise normal esophagus/Normal stomach/Normal D1 and D2 status post passage of a 56 [place] dilator  . ESOPHAGOGASTRODUODENOSCOPY (EGD) WITH ESOPHAGEAL DILATION N/A 01/25/2013   Dr. Gala Romney- schatzki's ring- 42 F dilation. small hiatal hernia  . TUBAL LIGATION      Allergies  Allergen Reactions  . Ciprofloxacin Shortness Of  Breath    Current Outpatient Medications  Medication Sig Dispense Refill  . acetaminophen (TYLENOL) 500 MG tablet Take 1,000 mg by mouth every 6 (six) hours as needed for pain.    Marland Kitchen ALPRAZolam (XANAX) 1 MG tablet Take 1 mg by mouth 3 (three) times daily as needed for anxiety.     Marland Kitchen amLODipine-valsartan (EXFORGE) 10-320 MG tablet TK 1 T PO D  1  . aspirin EC 81 MG tablet Take 81 mg by mouth every morning.     Marland Kitchen esomeprazole (NEXIUM) 20 MG capsule Take 20 mg by mouth as needed (Heartburn).     . hydrochlorothiazide (HYDRODIURIL) 25 MG tablet Take 1 tablet (25 mg total) daily by mouth. 90 tablet 3  . ibuprofen (ADVIL,MOTRIN) 200 MG tablet Take 800 mg by mouth every 6 (six) hours as needed for moderate pain.    . meloxicam (MOBIC) 7.5 MG tablet Take 1 tablet (7.5 mg total) by mouth daily. 30 tablet 2  . metoprolol succinate (TOPROL-XL) 50 MG 24 hr tablet TK 1 T PO ONCE DAILY  0  . Multiple Vitamin (MULTIVITAMIN WITH MINERALS) TABS tablet Take 1 tablet by mouth daily.    . simvastatin (ZOCOR) 40 MG tablet TK 1 T PO  QD  2  . metFORMIN (GLUCOPHAGE) 500 MG tablet Take 1 tablet (500 mg total) 2 (two) times daily with a meal by mouth. (  Patient not taking: Reported on 04/06/2017) 90 tablet 0   No current facility-administered medications for this visit.     Family History  Problem Relation Age of Onset  . Diabetes Other   . Diabetes Mother   . Heart disease Mother   . Heart disease Father   . Diabetes Sister   . Diabetes Brother   . Hypertension Brother   . Diabetes Daughter   . Hypertension Maternal Grandmother   . Stroke Maternal Grandfather   . Early death Paternal Grandfather   . Colon cancer Neg Hx   . Liver disease Neg Hx     Social History   Socioeconomic History  . Marital status: Single    Spouse name: Not on file  . Number of children: 3  . Years of education: Not on file  . Highest education level: Not on file  Social Needs  . Financial resource strain: Not on file  .  Food insecurity - worry: Not on file  . Food insecurity - inability: Not on file  . Transportation needs - medical: Not on file  . Transportation needs - non-medical: Not on file  Occupational History  . Occupation: English as a second language teacher: [employer]  Tobacco Use  . Smoking status: Former Smoker    Packs/day: 0.25    Years: 35.00    Pack years: 8.75    Types: Cigarettes    Last attempt to quit: 12/31/2012    Years since quitting: 4.3  . Smokeless tobacco: Never Used  Substance and Sexual Activity  . Alcohol use: Yes    Comment: occasionaly  . Drug use: No  . Sexual activity: Not on file  Other Topics Concern  . Not on file  Social History Narrative   Eats all food groups.    Wears seatbelt.    Has 3 children.   Prior smoker.   Attends church.      REVIEW OF SYSTEMS:   [X]  denotes positive finding, [ ]  denotes negative finding Cardiac  Comments:  Chest pain or chest pressure:    Shortness of breath upon exertion:    Short of breath when lying flat:    Irregular heart rhythm:        Vascular    Pain in calf, thigh, or hip brought on by ambulation: x   Pain in feet at night that wakes you up from your sleep:     Blood clot in your veins:    Leg swelling:  x       Pulmonary    Oxygen at home:    Productive cough:     Wheezing:         Neurologic    Sudden weakness in arms or legs:     Sudden numbness in arms or legs:     Sudden onset of difficulty speaking or slurred speech:    Temporary loss of vision in one eye:     Problems with dizziness:         Gastrointestinal    Blood in stool:     Vomited blood:         Genitourinary    Burning when urinating:     Blood in urine:        Psychiatric    Major depression:         Hematologic    Bleeding problems:    Problems with blood clotting too easily:        Skin    Rashes  or ulcers:        Constitutional    Fever or chills:      PHYSICAL EXAMINATION:  Vitals:   04/21/17 1405 04/21/17  1407  BP: (!) 159/79 (!) 150/79  Pulse: 76 76  Resp:    Temp:    SpO2:     Body mass index is 28.66 kg/m.  General:  WDWN in NAD; vital signs documented above Gait: Not observed HENT: WNL, normocephalic Pulmonary: normal non-labored breathing , without Rales, rhonchi,  wheezing Cardiac: regular HR, without  Murmurs, rubs or gallops; with left carotid bruit  Abdomen: soft, NT, no masses Skin: without rashes Vascular Exam/Pulses:  Right Left  Radial 2+ (normal) 2+ (normal)  Femoral 2+ (normal) 2+ (normal)  DP Unable to palpate  Unable to palpate   PT Unable to palpate  Unable to palpate    Extremities: without ischemic changes, without Gangrene , without cellulitis; without open wounds;  Musculoskeletal: no muscle wasting or atrophy  Neurologic: A&O X 3;  No focal weakness or paresthesias are detected Psychiatric:  The pt has Normal affect.   Non-Invasive Vascular Imaging:   Carotid Duplex on 03/18/17: -50-69% stenosis of the proximal bilateral ICA -right vertebral is not visualized  -patent left vertebral artery with normal antegrade flow  Pt meds includes: Statin:  Yes.   Beta Blocker:  Yes.   Aspirin:  Yes.   ACEI:  No. ARB:  Yes.   CCB use:  Yes Other Antiplatelet/Anticoagulant:  No   ASSESSMENT/PLAN:: 61 y.o. female with asymptomatic bilateral carotid artery stenosis   -pt was found to have moderate bilateral carotid artery stenosis after a left carotid bruit was heard.  She has been asymptomatic.   -we will have he come back in 6 months with repeat carotid duplex and see Dr. Donzetta Matters since he is following her AAA for continuity of care.  Dr. Oneida Alar discussed in detail the symptoms of stroke/TIA and that if she develops these sx, she should contact us or go to the ER.  If her disease progresses to greater than 80%, we will discuss options of carotid endarterectomy.   -continue statin/aspirin -pt reports that her leg issues are improved from her visit with Dr.  Donzetta Matters in August.  Continue to exercise.   Leontine Locket, PA-C [facility] [phone]  Clinic MD:  Pt seen and examined with Dr. Oneida Alar  History and exam findings as above.  Patient has a left carotid bruit.  Carotid duplex exam shows moderate bilateral carotid stenosis but currently asymptomatic.  She will continue her daily aspirin and statin.  Since she has previously seen my partner Dr. Donzetta Matters for her abdominal aortic aneurysm she will follow-up with him in 6 months for repeat carotid duplex exam or sooner if she develops any of the symptoms mentioned above.  Ruta Hinds, MD [facility] [place] Office: [phone] Pager: [phone]

## 2017-04-22 DIAGNOSIS — M6281 Muscle weakness (generalized): Secondary | ICD-10-CM | POA: Diagnosis not present

## 2017-04-22 DIAGNOSIS — M25512 Pain in left shoulder: Secondary | ICD-10-CM | POA: Diagnosis not present

## 2017-04-22 DIAGNOSIS — M1990 Unspecified osteoarthritis, unspecified site: Secondary | ICD-10-CM | POA: Diagnosis not present

## 2017-04-22 DIAGNOSIS — M25612 Stiffness of left shoulder, not elsewhere classified: Secondary | ICD-10-CM | POA: Diagnosis not present

## 2017-04-22 NOTE — Addendum Note (Signed)
Addended by: Lianne Cure A on: 04/22/2017 11:08 AM   Modules accepted: Orders

## 2017-04-25 ENCOUNTER — Telehealth: Payer: Self-pay | Admitting: Radiology

## 2017-04-25 ENCOUNTER — Ambulatory Visit: Payer: BLUE CROSS/BLUE SHIELD | Admitting: Family Medicine

## 2017-04-25 ENCOUNTER — Other Ambulatory Visit: Payer: Self-pay

## 2017-04-25 ENCOUNTER — Encounter: Payer: Self-pay | Admitting: Family Medicine

## 2017-04-25 VITALS — BP 158/80 | HR 65 | Temp 98.8°F | Resp 16 | Ht 67.0 in | Wt 184.8 lb

## 2017-04-25 DIAGNOSIS — I6523 Occlusion and stenosis of bilateral carotid arteries: Secondary | ICD-10-CM

## 2017-04-25 DIAGNOSIS — E119 Type 2 diabetes mellitus without complications: Secondary | ICD-10-CM | POA: Diagnosis not present

## 2017-04-25 DIAGNOSIS — I714 Abdominal aortic aneurysm, without rupture, unspecified: Secondary | ICD-10-CM

## 2017-04-25 DIAGNOSIS — I10 Essential (primary) hypertension: Secondary | ICD-10-CM | POA: Insufficient documentation

## 2017-04-25 DIAGNOSIS — E785 Hyperlipidemia, unspecified: Secondary | ICD-10-CM

## 2017-04-25 MED ORDER — METOPROLOL SUCCINATE ER 100 MG PO TB24: 100.0000 mg | ORAL_TABLET | Freq: Every day | ORAL | 3 refills | Status: DC

## 2017-04-25 MED ORDER — ATORVASTATIN CALCIUM 40 MG PO TABS: 40.0000 mg | ORAL_TABLET | Freq: Every day | ORAL | 3 refills | Status: DC

## 2017-04-25 MED ORDER — METFORMIN HCL 500 MG PO TABS: 500.0000 mg | ORAL_TABLET | Freq: Two times a day (BID) | ORAL | 0 refills | Status: DC

## 2017-04-25 NOTE — Telephone Encounter (Signed)
Error

## 2017-04-25 NOTE — Progress Notes (Signed)
Patient ID: [name], female    DOB: [birth year], 62 y.o.   MRN: [id number]  Chief Complaint  Patient presents with  . Follow-up  . Hypertension    Allergies Ciprofloxacin  Subjective:   [name] is a 61 y.o. female who presents to [facility] today.  HPI [name] presents today for follow-up of her blood pressure.  Since she was last seen here she has been evaluated by Dr. Oneida Alar due to her carotid artery stenosis.  She is not a candidate for surgery at this time but will require follow-up in 6 months to evaluate carotid arteries and she needs to continue with risk factor reduction.  Reports that she was unable to take the metoprolol tartrate 100 mg twice a day.  She felt like it lowered her blood pressure and made her feel tired and low energy.  She has been taking the metoprolol succinate ER 50 mg twice a day.  She understands that her blood pressure is still been running high.  She has a cuff and checks her blood pressure at home from time to time.  She has not brought his cuff in to be checked at our office.  She denies any chest pain, swelling in her extremities, palpitations, or numbness and tingling in her extremities.  She reports that over 10 years ago she was diagnosed with diabetes after being placed on some steroid medication.  She reports she was initially on oral medication and insulin injections.  She was able to lose weight and make dietary changes.  She reports she eventually came off the insulin.  Has been taking metformin 500 mg twice a day.  She does not check her blood sugars at home.  She denies any hypoglycemic episodes.  Denies any lesions on her feet.  Diabetic eye exam is performed yearly by Dr. Gershon Crane.  Denies any symptoms with the metformin.  Would like to come off medication.  She is also on simvastatin due to her cholesterol.  Her last cholesterol panel that was checked in November revealed an LDL greater than 100.  She has  never been on Lipitor.  Is taking all of her blood pressure medicines as directed.   Hypertension  This is a chronic problem. The current episode started more than 1 year ago. The problem has been waxing and waning since onset. The problem is uncontrolled. Pertinent negatives include no anxiety, blurred vision, chest pain, headaches, neck pain, orthopnea, palpitations, peripheral edema, PND or shortness of breath. There are no associated agents to hypertension. Risk factors for coronary artery disease include diabetes mellitus, dyslipidemia, family history and post-menopausal state. Past treatments include ACE inhibitors, lifestyle changes, diuretics, angiotensin blockers and calcium channel blockers. The current treatment provides moderate improvement. Compliance problems include diet and exercise.  Hypertensive end-organ damage includes PVD. There is no history of kidney disease, CAD/MI, CVA, heart failure, left ventricular hypertrophy or retinopathy.  Diabetes  She presents for her follow-up diabetic visit. She has type 2 diabetes mellitus. No MedicAlert identification noted. Her disease course has been improving. There are no hypoglycemic associated symptoms. Pertinent negatives for hypoglycemia include no confusion, dizziness, headaches or mood changes. Pertinent negatives for diabetes include no blurred vision, no chest pain, no fatigue, no foot paresthesias, no foot ulcerations, no polydipsia, no polyphagia, no polyuria, no visual change, no weakness and no weight loss. There are no hypoglycemic complications. Symptoms are improving. Diabetic complications include PVD. Pertinent negatives for diabetic complications include no CVA  or retinopathy. Risk factors for coronary artery disease include diabetes mellitus, dyslipidemia, family history, hypertension and post-menopausal. Current diabetic treatment includes oral agent (monotherapy). She is compliant with treatment all of the time. Her weight is  stable. She is following a diabetic and generally healthy diet. Meal planning includes avoidance of concentrated sweets. She participates in exercise three times a week. An ACE inhibitor/angiotensin II receptor blocker is being taken. Eye exam is current.    Past Medical History:  Diagnosis Date  . Abdominal aortic aneurysm (AAA) 3.0 cm to 5.0 cm in diameter in female [facility])   . Hiatal hernia    small  . Hyperlipidemia   . Hypertension   . Schatzki's ring   . Tremor     Past Surgical History:  Procedure Laterality Date  . CHOLECYSTECTOMY    . COLONOSCOPY    06/16/2004   VQQ:[id number] rectum/Diminutive polyps of the splenic flexure and the cecum/The remainder of the colonic mucosa appeared normal pathology (hyperplastic polyps).  . COLONOSCOPY N/A 08/07/2015   Procedure: COLONOSCOPY;  Surgeon: Daneil Dolin, MD;  Location: [facility];  Service: Endoscopy;  Laterality: N/A;  11:30 Am  . ESOPHAGOGASTRODUODENOSCOPY  09/11/2001   VFI:EPPIRJJOA ring otherwise normal esophagus/Normal stomach/Normal D1 and D2 status post passage of a 56 [place] dilator  . ESOPHAGOGASTRODUODENOSCOPY (EGD) WITH ESOPHAGEAL DILATION N/A 01/25/2013   Dr. Gala Romney- schatzki's ring- 11 F dilation. small hiatal hernia  . TUBAL LIGATION      Family History  Problem Relation Age of Onset  . Diabetes Other   . Diabetes Mother   . Heart disease Mother   . Heart disease Father   . Diabetes Sister   . Diabetes Brother   . Hypertension Brother   . Diabetes Daughter   . Hypertension Maternal Grandmother   . Stroke Maternal Grandfather   . Early death Paternal Grandfather   . Colon cancer Neg Hx   . Liver disease Neg Hx      Social History   Socioeconomic History  . Marital status: Single    Spouse name: None  . Number of children: 3  . Years of education: None  . Highest education level: None  Social Needs  . Financial resource strain: None  . Food insecurity - worry: None  . Food insecurity -  inability: None  . Transportation needs - medical: None  . Transportation needs - non-medical: None  Occupational History  . Occupation: English as a second language teacher: [employer]  Tobacco Use  . Smoking status: Former Smoker    Packs/day: 0.25    Years: 35.00    Pack years: 8.75    Types: Cigarettes    Last attempt to quit: 12/31/2012    Years since quitting: 4.3  . Smokeless tobacco: Never Used  Substance and Sexual Activity  . Alcohol use: Yes    Comment: occasionaly  . Drug use: No  . Sexual activity: None  Other Topics Concern  . None  Social History Narrative   Eats all food groups.    Wears seatbelt.    Has 3 children.   Prior smoker.   Attends church.     Review of Systems  Constitutional: Negative for fatigue and weight loss.  Eyes: Negative for blurred vision.  Respiratory: Negative for shortness of breath.   Cardiovascular: Negative for chest pain, palpitations, orthopnea and PND.  Endocrine: Negative for polydipsia, polyphagia and polyuria.  Musculoskeletal: Negative for neck pain.  Neurological: Negative for dizziness, weakness and headaches.  Psychiatric/Behavioral: Negative for confusion.     Objective:   BP (!) 158/80 (BP Location: Left Arm, Patient Position: Sitting, Cuff Size: Normal)   Pulse 65   Temp 98.8 F (37.1 C) (Other (Comment))   Resp 16   Ht 5\' 7"  (1.702 m)   Wt 184 lb 12 oz (83.8 kg)   SpO2 98%   BMI 28.94 kg/m   Physical Exam  Constitutional: She is oriented to person, place, and time. She appears well-developed and well-nourished. No distress.  HENT:  Head: Normocephalic and atraumatic.  Eyes: Pupils are equal, round, and reactive to light.  Neck: Normal range of motion. Neck supple. No thyromegaly present.  Cardiovascular: Normal rate, regular rhythm and normal heart sounds.  Pulses:      Dorsalis pedis pulses are 1+ on the right side, and 1+ on the left side.       Posterior tibial pulses are 1+ on the right side, and  1+ on the left side.  Pulmonary/Chest: Effort normal and breath sounds normal. No respiratory distress.  Musculoskeletal:       Right foot: There is normal range of motion and no deformity.       Left foot: There is normal range of motion and no deformity.  Feet:  Right Foot:  Protective Sensation: 7 sites tested. 7 sites sensed.  Skin Integrity: Negative for ulcer, blister, skin breakdown, erythema, warmth, callus or dry skin.  Left Foot:  Protective Sensation: 7 sites tested. 7 sites sensed.  Skin Integrity: Negative for ulcer, blister, skin breakdown, erythema, warmth, callus or dry skin.  Neurological: She is alert and oriented to person, place, and time. No cranial nerve deficit.  Skin: Skin is warm and dry.  Psychiatric: She has a normal mood and affect. Her speech is normal and behavior is normal. Judgment and thought content normal. Cognition and memory are normal. She is attentive.  Nursing note and vitals reviewed.    Assessment and Plan  1. Essential hypertension Review of patient's medications today in the office.  She is going to stop the metoprolol succinate XL 50 mg twice a day.  We will start the metoprolol succinate XL 100 mg a day.  She will continue all of her other blood pressure medications.  She will bring in her blood pressure cuff to the next visit and also bring in the readings that she will take sporadically at different times during the day.  She will continue her aspirin 81 mg a day.  We discussed why blood pressure control is necessary in her to lower her cardiovascular risks. - metoprolol succinate (TOPROL-XL) 100 MG 24 hr tablet; Take 1 tablet (100 mg total) by mouth daily. Take with or immediately following a meal.  Dispense: 90 tablet; Refill: 3  2. Hyperlipidemia LDL goal <70 Due to the fact that she is on amlodipine and we cannot increase her simvastatin to a higher dose to control her LDL, we will switch to Lipitor 40 mg a day.  Plan to check cholesterol  panel in 2 months.  LDL goal less than 70.  We will also check liver tests at that time. - atorvastatin (LIPITOR) 40 MG tablet; Take 1 tablet (40 mg total) by mouth daily.  Dispense: 90 tablet; Refill: 3  3. Type 2 diabetes mellitus without complication, without long-term current use of insulin (HCC) We will decrease metformin to 1 pill a day.  Patient was congratulated on her exercise and dietary changes.  We discussed dietary modifications to lower  her blood sugar.  She would like to come off the metformin totally.  We will plan to recheck her hemoglobin A1c in 3 months.  She wishes to come off all of her medications.  We did discuss that this is possible for her metformin but I do not believe she will ever come off of her blood pressure medications and right now our goal should be to get her blood pressure controlled. - metFORMIN (GLUCOPHAGE) 500 MG tablet; Take 1 tablet (500 mg total) by mouth 1 time daily with a meal.  Dispense: 180 tablet; Refill: 0 - Ambulatory referral to Ophthalmology; she is due in several months to follow-up with Dr. Gershon Crane.  Asked to schedule that appointment -Diabetic foot exam performed today.  Foot counseling and foot care discussed.  4. Carotid artery stenosis without cerebral infarction, bilateral Office note reviewed from Dr. Oneida Alar.  Continue aspirin each day.  Exercise as directed.  5. AAA (abdominal aortic aneurysm) without rupture ([place]) Keep scheduled follow-up with Dr. Gwenlyn Saran as directed.  Records requested from [place].  Patient was a previous patient of Dr. Willey Blade.  I explained to her that we need to get her immunization records to ensure that she has received the appropriate pneumonia shots related to her diabetes. Return in about 4 weeks (around 05/23/2017) for follow up BP. Caren Macadam, MD 04/25/2017

## 2017-04-25 NOTE — Patient Instructions (Addendum)
Change your cholesterol medication to Lipitor/Atorvastatin 40 mg a day.   Stop the Metoprolol succinate ER 50 mg twice a day  Start the Metoprolol succinate ER 100 mg day  Follow up in 2 weeks.  Bring in your BP cuff to the next visit.  Record BP and bring into the next visit.  Decrease the metformin to 1 a day  DASH Eating Plan DASH stands for "Dietary Approaches to Stop Hypertension." The DASH eating plan is a healthy eating plan that has been shown to reduce high blood pressure (hypertension). It may also reduce your risk for type 2 diabetes, heart disease, and stroke. The DASH eating plan may also help with weight loss. What are tips for following this plan? General guidelines  Avoid eating more than 2,300 mg (milligrams) of salt (sodium) a day. If you have hypertension, you may need to reduce your sodium intake to 1,500 mg a day.  Limit alcohol intake to no more than 1 drink a day for nonpregnant women and 2 drinks a day for men. One drink equals 12 oz of beer, 5 oz of wine, or 1 oz of hard liquor.  Work with your health care provider to maintain a healthy body weight or to lose weight. Ask what an ideal weight is for you.  Get at least 30 minutes of exercise that causes your heart to beat faster (aerobic exercise) most days of the week. Activities may include walking, swimming, or biking.  Work with your health care provider or diet and nutrition specialist (dietitian) to adjust your eating plan to your individual calorie needs. Reading food labels  Check food labels for the amount of sodium per serving. Choose foods with less than 5 percent of the Daily Value of sodium. Generally, foods with less than 300 mg of sodium per serving fit into this eating plan.  To find whole grains, look for the word "whole" as the first word in the ingredient list. Shopping  Buy products labeled as "low-sodium" or "no salt added."  Buy fresh foods. Avoid canned foods and premade or frozen  meals. Cooking  Avoid adding salt when cooking. Use salt-free seasonings or herbs instead of table salt or sea salt. Check with your health care provider or pharmacist before using salt substitutes.  Do not fry foods. Cook foods using healthy methods such as baking, boiling, grilling, and broiling instead.  Cook with heart-healthy oils, such as olive, canola, soybean, or sunflower oil. Meal planning   Eat a balanced diet that includes: ? 5 or more servings of fruits and vegetables each day. At each meal, try to fill half of your plate with fruits and vegetables. ? Up to 6-8 servings of whole grains each day. ? Less than 6 oz of lean meat, poultry, or fish each day. A 3-oz serving of meat is about the same size as a deck of cards. One egg equals 1 oz. ? 2 servings of low-fat dairy each day. ? A serving of nuts, seeds, or beans 5 times each week. ? Heart-healthy fats. Healthy fats called Omega-3 fatty acids are found in foods such as flaxseeds and coldwater fish, like sardines, salmon, and mackerel.  Limit how much you eat of the following: ? Canned or prepackaged foods. ? Food that is high in trans fat, such as fried foods. ? Food that is high in saturated fat, such as fatty meat. ? Sweets, desserts, sugary drinks, and other foods with added sugar. ? Full-fat dairy products.  Do not salt  foods before eating.  Try to eat at least 2 vegetarian meals each week.  Eat more home-cooked food and less restaurant, buffet, and fast food.  When eating at a restaurant, ask that your food be prepared with less salt or no salt, if possible. What foods are recommended? The items listed may not be a complete list. Talk with your dietitian about what dietary choices are best for you. Grains Whole-grain or whole-wheat bread. Whole-grain or whole-wheat pasta. Brown rice. [place] Morrow. Bulgur. Whole-grain and low-sodium cereals. Pita bread. Low-fat, low-sodium crackers. Whole-wheat flour  tortillas. Vegetables Fresh or frozen vegetables (raw, steamed, roasted, or grilled). Low-sodium or reduced-sodium tomato and vegetable juice. Low-sodium or reduced-sodium tomato sauce and tomato paste. Low-sodium or reduced-sodium canned vegetables. Fruits All fresh, dried, or frozen fruit. Canned fruit in natural juice (without added sugar). Meat and other protein foods Skinless chicken or [place]. Ground chicken or [place]. Pork with fat trimmed off. Fish and seafood. Egg whites. Dried beans, peas, or lentils. Unsalted nuts, nut butters, and seeds. Unsalted canned beans. Lean cuts of beef with fat trimmed off. Low-sodium, lean deli meat. Dairy Low-fat (1%) or fat-free (skim) milk. Fat-free, low-fat, or reduced-fat cheeses. Nonfat, low-sodium ricotta or cottage cheese. Low-fat or nonfat yogurt. Low-fat, low-sodium cheese. Fats and oils Soft margarine without trans fats. Vegetable oil. Low-fat, reduced-fat, or light mayonnaise and salad dressings (reduced-sodium). Canola, safflower, olive, soybean, and sunflower oils. Avocado. Seasoning and other foods Herbs. Spices. Seasoning mixes without salt. Unsalted popcorn and pretzels. Fat-free sweets. What foods are not recommended? The items listed may not be a complete list. Talk with your dietitian about what dietary choices are best for you. Grains Baked goods made with fat, such as croissants, muffins, or some breads. Dry pasta or rice meal packs. Vegetables Creamed or fried vegetables. Vegetables in a cheese sauce. Regular canned vegetables (not low-sodium or reduced-sodium). Regular canned tomato sauce and paste (not low-sodium or reduced-sodium). Regular tomato and vegetable juice (not low-sodium or reduced-sodium). Angie Fava. Olives. Fruits Canned fruit in a light or heavy syrup. Fried fruit. Fruit in cream or butter sauce. Meat and other protein foods Fatty cuts of meat. Ribs. Fried meat. Berniece Salines. Sausage. Bologna and other processed lunch meats.  Salami. Fatback. Hotdogs. Bratwurst. Salted nuts and seeds. Canned beans with added salt. Canned or smoked fish. Whole eggs or egg yolks. Chicken or [place] with skin. Dairy Whole or 2% milk, cream, and half-and-half. Whole or full-fat cream cheese. Whole-fat or sweetened yogurt. Full-fat cheese. Nondairy creamers. Whipped toppings. Processed cheese and cheese spreads. Fats and oils Butter. Stick margarine. Lard. Shortening. Ghee. Bacon fat. Tropical oils, such as coconut, palm kernel, or palm oil. Seasoning and other foods Salted popcorn and pretzels. Onion salt, garlic salt, seasoned salt, table salt, and sea salt. Worcestershire sauce. Tartar sauce. Barbecue sauce. Teriyaki sauce. Soy sauce, including reduced-sodium. Steak sauce. Canned and packaged gravies. Fish sauce. Oyster sauce. Cocktail sauce. Horseradish that you find on the shelf. Ketchup. Mustard. Meat flavorings and tenderizers. Bouillon cubes. Hot sauce and Tabasco sauce. Premade or packaged marinades. Premade or packaged taco seasonings. Relishes. Regular salad dressings. Where to find more information:  National Heart, Lung, and [place]: https://wilson-eaton.com/  American Heart Association: www.heart.org Summary  The DASH eating plan is a healthy eating plan that has been shown to reduce high blood pressure (hypertension). It may also reduce your risk for type 2 diabetes, heart disease, and stroke.  With the DASH eating plan, you should limit salt (sodium) intake to 2,300  mg a day. If you have hypertension, you may need to reduce your sodium intake to 1,500 mg a day.  When on the DASH eating plan, aim to eat more fresh fruits and vegetables, whole grains, lean proteins, low-fat dairy, and heart-healthy fats.  Work with your health care provider or diet and nutrition specialist (dietitian) to adjust your eating plan to your individual calorie needs. This information is not intended to replace advice given to you by your health  care provider. Make sure you discuss any questions you have with your health care provider. Document Released: 04/15/2011 Document Revised: 04/19/2016 Document Reviewed: 04/19/2016 Elsevier Interactive Patient Education  2017 [employer].

## 2017-04-26 DIAGNOSIS — M6281 Muscle weakness (generalized): Secondary | ICD-10-CM | POA: Diagnosis not present

## 2017-04-26 DIAGNOSIS — M25612 Stiffness of left shoulder, not elsewhere classified: Secondary | ICD-10-CM | POA: Diagnosis not present

## 2017-04-26 DIAGNOSIS — M1990 Unspecified osteoarthritis, unspecified site: Secondary | ICD-10-CM | POA: Diagnosis not present

## 2017-04-26 DIAGNOSIS — M25512 Pain in left shoulder: Secondary | ICD-10-CM | POA: Diagnosis not present

## 2017-04-28 ENCOUNTER — Telehealth: Payer: Self-pay | Admitting: Family Medicine

## 2017-04-28 MED ORDER — BLOOD GLUCOSE MONITOR KIT: PACK | 0 refills | Status: DC

## 2017-04-28 NOTE — Telephone Encounter (Signed)
Pt wants to know if Dr Mannie Stabile will send in a RX for Meter & Test strips to check her sugar, as Dr Mannie Stabile stated that she was Borderline Diab, and she feels that her sugar is dropping.

## 2017-04-29 DIAGNOSIS — M25512 Pain in left shoulder: Secondary | ICD-10-CM | POA: Diagnosis not present

## 2017-04-29 DIAGNOSIS — M6281 Muscle weakness (generalized): Secondary | ICD-10-CM | POA: Diagnosis not present

## 2017-04-29 DIAGNOSIS — M25612 Stiffness of left shoulder, not elsewhere classified: Secondary | ICD-10-CM | POA: Diagnosis not present

## 2017-04-29 DIAGNOSIS — M1990 Unspecified osteoarthritis, unspecified site: Secondary | ICD-10-CM | POA: Diagnosis not present

## 2017-05-04 DIAGNOSIS — M25612 Stiffness of left shoulder, not elsewhere classified: Secondary | ICD-10-CM | POA: Diagnosis not present

## 2017-05-04 DIAGNOSIS — M1990 Unspecified osteoarthritis, unspecified site: Secondary | ICD-10-CM | POA: Diagnosis not present

## 2017-05-04 DIAGNOSIS — M25512 Pain in left shoulder: Secondary | ICD-10-CM | POA: Diagnosis not present

## 2017-05-04 DIAGNOSIS — M6281 Muscle weakness (generalized): Secondary | ICD-10-CM | POA: Diagnosis not present

## 2017-05-05 DIAGNOSIS — M6281 Muscle weakness (generalized): Secondary | ICD-10-CM | POA: Diagnosis not present

## 2017-05-05 DIAGNOSIS — M1990 Unspecified osteoarthritis, unspecified site: Secondary | ICD-10-CM | POA: Diagnosis not present

## 2017-05-05 DIAGNOSIS — M25612 Stiffness of left shoulder, not elsewhere classified: Secondary | ICD-10-CM | POA: Diagnosis not present

## 2017-05-05 DIAGNOSIS — M25512 Pain in left shoulder: Secondary | ICD-10-CM | POA: Diagnosis not present

## 2017-05-06 DIAGNOSIS — M1990 Unspecified osteoarthritis, unspecified site: Secondary | ICD-10-CM | POA: Diagnosis not present

## 2017-05-06 DIAGNOSIS — M6281 Muscle weakness (generalized): Secondary | ICD-10-CM | POA: Diagnosis not present

## 2017-05-06 DIAGNOSIS — M25512 Pain in left shoulder: Secondary | ICD-10-CM | POA: Diagnosis not present

## 2017-05-06 DIAGNOSIS — M25612 Stiffness of left shoulder, not elsewhere classified: Secondary | ICD-10-CM | POA: Diagnosis not present

## 2017-05-09 DIAGNOSIS — M1990 Unspecified osteoarthritis, unspecified site: Secondary | ICD-10-CM | POA: Diagnosis not present

## 2017-05-09 DIAGNOSIS — M6281 Muscle weakness (generalized): Secondary | ICD-10-CM | POA: Diagnosis not present

## 2017-05-09 DIAGNOSIS — M25612 Stiffness of left shoulder, not elsewhere classified: Secondary | ICD-10-CM | POA: Diagnosis not present

## 2017-05-09 DIAGNOSIS — M25512 Pain in left shoulder: Secondary | ICD-10-CM | POA: Diagnosis not present

## 2017-05-12 ENCOUNTER — Ambulatory Visit: Payer: BLUE CROSS/BLUE SHIELD | Admitting: Family Medicine

## 2017-05-17 ENCOUNTER — Other Ambulatory Visit: Payer: Self-pay

## 2017-05-17 ENCOUNTER — Encounter: Payer: Self-pay | Admitting: Family Medicine

## 2017-05-17 ENCOUNTER — Ambulatory Visit: Payer: BLUE CROSS/BLUE SHIELD | Admitting: Family Medicine

## 2017-05-17 VITALS — BP 140/62 | HR 99 | Temp 97.9°F | Resp 16 | Ht 66.0 in | Wt 181.2 lb

## 2017-05-17 DIAGNOSIS — Z79899 Other long term (current) drug therapy: Secondary | ICD-10-CM

## 2017-05-17 DIAGNOSIS — E119 Type 2 diabetes mellitus without complications: Secondary | ICD-10-CM | POA: Diagnosis not present

## 2017-05-17 DIAGNOSIS — E785 Hyperlipidemia, unspecified: Secondary | ICD-10-CM | POA: Diagnosis not present

## 2017-05-17 DIAGNOSIS — I10 Essential (primary) hypertension: Secondary | ICD-10-CM | POA: Diagnosis not present

## 2017-05-17 NOTE — Patient Instructions (Signed)
DASH Eating Plan DASH stands for "Dietary Approaches to Stop Hypertension." The DASH eating plan is a healthy eating plan that has been shown to reduce high blood pressure (hypertension). It may also reduce your risk for type 2 diabetes, heart disease, and stroke. The DASH eating plan may also help with weight loss. What are tips for following this plan? General guidelines  Avoid eating more than 2,300 mg (milligrams) of salt (sodium) a day. If you have hypertension, you may need to reduce your sodium intake to 1,500 mg a day.  Limit alcohol intake to no more than 1 drink a day for nonpregnant women and 2 drinks a day for men. One drink equals 12 oz of beer, 5 oz of wine, or 1 oz of hard liquor.  Work with your health care provider to maintain a healthy body weight or to lose weight. Ask what an ideal weight is for you.  Get at least 30 minutes of exercise that causes your heart to beat faster (aerobic exercise) most days of the week. Activities may include walking, swimming, or biking.  Work with your health care provider or diet and nutrition specialist (dietitian) to adjust your eating plan to your individual calorie needs. Reading food labels  Check food labels for the amount of sodium per serving. Choose foods with less than 5 percent of the Daily Value of sodium. Generally, foods with less than 300 mg of sodium per serving fit into this eating plan.  To find whole grains, look for the word "whole" as the first word in the ingredient list. Shopping  Buy products labeled as "low-sodium" or "no salt added."  Buy fresh foods. Avoid canned foods and premade or frozen meals. Cooking  Avoid adding salt when cooking. Use salt-free seasonings or herbs instead of table salt or sea salt. Check with your health care provider or pharmacist before using salt substitutes.  Do not fry foods. Cook foods using healthy methods such as baking, boiling, grilling, and broiling instead.  Cook with  heart-healthy oils, such as olive, canola, soybean, or sunflower oil. Meal planning   Eat a balanced diet that includes: ? 5 or more servings of fruits and vegetables each day. At each meal, try to fill half of your plate with fruits and vegetables. ? Up to 6-8 servings of whole grains each day. ? Less than 6 oz of lean meat, poultry, or fish each day. A 3-oz serving of meat is about the same size as a deck of cards. One egg equals 1 oz. ? 2 servings of low-fat dairy each day. ? A serving of nuts, seeds, or beans 5 times each week. ? Heart-healthy fats. Healthy fats called Omega-3 fatty acids are found in foods such as flaxseeds and coldwater fish, like sardines, salmon, and mackerel.  Limit how much you eat of the following: ? Canned or prepackaged foods. ? Food that is high in trans fat, such as fried foods. ? Food that is high in saturated fat, such as fatty meat. ? Sweets, desserts, sugary drinks, and other foods with added sugar. ? Full-fat dairy products.  Do not salt foods before eating.  Try to eat at least 2 vegetarian meals each week.  Eat more home-cooked food and less restaurant, buffet, and fast food.  When eating at a restaurant, ask that your food be prepared with less salt or no salt, if possible. What foods are recommended? The items listed may not be a complete list. Talk with your dietitian about what   dietary choices are best for you. Grains Whole-grain or whole-wheat bread. Whole-grain or whole-wheat pasta. Brown rice. Oatmeal. Quinoa. Bulgur. Whole-grain and low-sodium cereals. Pita bread. Low-fat, low-sodium crackers. Whole-wheat flour tortillas. Vegetables Fresh or frozen vegetables (raw, steamed, roasted, or grilled). Low-sodium or reduced-sodium tomato and vegetable juice. Low-sodium or reduced-sodium tomato sauce and tomato paste. Low-sodium or reduced-sodium canned vegetables. Fruits All fresh, dried, or frozen fruit. Canned fruit in natural juice (without  added sugar). Meat and other protein foods Skinless chicken or turkey. Ground chicken or turkey. Pork with fat trimmed off. Fish and seafood. Egg whites. Dried beans, peas, or lentils. Unsalted nuts, nut butters, and seeds. Unsalted canned beans. Lean cuts of beef with fat trimmed off. Low-sodium, lean deli meat. Dairy Low-fat (1%) or fat-free (skim) milk. Fat-free, low-fat, or reduced-fat cheeses. Nonfat, low-sodium ricotta or cottage cheese. Low-fat or nonfat yogurt. Low-fat, low-sodium cheese. Fats and oils Soft margarine without trans fats. Vegetable oil. Low-fat, reduced-fat, or light mayonnaise and salad dressings (reduced-sodium). Canola, safflower, olive, soybean, and sunflower oils. Avocado. Seasoning and other foods Herbs. Spices. Seasoning mixes without salt. Unsalted popcorn and pretzels. Fat-free sweets. What foods are not recommended? The items listed may not be a complete list. Talk with your dietitian about what dietary choices are best for you. Grains Baked goods made with fat, such as croissants, muffins, or some breads. Dry pasta or rice meal packs. Vegetables Creamed or fried vegetables. Vegetables in a cheese sauce. Regular canned vegetables (not low-sodium or reduced-sodium). Regular canned tomato sauce and paste (not low-sodium or reduced-sodium). Regular tomato and vegetable juice (not low-sodium or reduced-sodium). Pickles. Olives. Fruits Canned fruit in a light or heavy syrup. Fried fruit. Fruit in cream or butter sauce. Meat and other protein foods Fatty cuts of meat. Ribs. Fried meat. Bacon. Sausage. Bologna and other processed lunch meats. Salami. Fatback. Hotdogs. Bratwurst. Salted nuts and seeds. Canned beans with added salt. Canned or smoked fish. Whole eggs or egg yolks. Chicken or turkey with skin. Dairy Whole or 2% milk, cream, and half-and-half. Whole or full-fat cream cheese. Whole-fat or sweetened yogurt. Full-fat cheese. Nondairy creamers. Whipped toppings.  Processed cheese and cheese spreads. Fats and oils Butter. Stick margarine. Lard. Shortening. Ghee. Bacon fat. Tropical oils, such as coconut, palm kernel, or palm oil. Seasoning and other foods Salted popcorn and pretzels. Onion salt, garlic salt, seasoned salt, table salt, and sea salt. Worcestershire sauce. Tartar sauce. Barbecue sauce. Teriyaki sauce. Soy sauce, including reduced-sodium. Steak sauce. Canned and packaged gravies. Fish sauce. Oyster sauce. Cocktail sauce. Horseradish that you find on the shelf. Ketchup. Mustard. Meat flavorings and tenderizers. Bouillon cubes. Hot sauce and Tabasco sauce. Premade or packaged marinades. Premade or packaged taco seasonings. Relishes. Regular salad dressings. Where to find more information:  National Heart, Lung, and Blood Institute: www.nhlbi.nih.gov  American Heart Association: www.heart.org Summary  The DASH eating plan is a healthy eating plan that has been shown to reduce high blood pressure (hypertension). It may also reduce your risk for type 2 diabetes, heart disease, and stroke.  With the DASH eating plan, you should limit salt (sodium) intake to 2,300 mg a day. If you have hypertension, you may need to reduce your sodium intake to 1,500 mg a day.  When on the DASH eating plan, aim to eat more fresh fruits and vegetables, whole grains, lean proteins, low-fat dairy, and heart-healthy fats.  Work with your health care provider or diet and nutrition specialist (dietitian) to adjust your eating plan to your individual   calorie needs. This information is not intended to replace advice given to you by your health care provider. Make sure you discuss any questions you have with your health care provider. Document Released: 04/15/2011 Document Revised: 04/19/2016 Document Reviewed: 04/19/2016 Elsevier Interactive Patient Education  2018 Elsevier Inc.  

## 2017-05-17 NOTE — Progress Notes (Signed)
Patient ID: [name], female    DOB: [birth year], 62 y.o.   MRN: [id number]  Chief Complaint  Patient presents with  . Follow-up    Allergies Ciprofloxacin  Subjective:   [name] is a 62 y.o. female who presents to [facility] today.  HPI Here to follow up on BP. Has been checking it at home and brings in her BP cuff and record book of readings to the visit today. Reports that she feels good. Energy is good. No CP, SOB, edema in LE. Taking all medications as directed.  Records indicate that her BP has ranged from 110-150/50-80.  Pressures mainly in the 120s over 70s-80s.  A few outlying readings.  Patient has been checking her blood pressure 2-3 times a day.  Reports that her blood pressure tends to run well at home and then every time she goes to the doctor it tends to be elevated.  She takes all 3 of her medications each day as directed.  She reports she is always compliant.   Current Outpatient Medications on File Prior to Visit  Medication Sig Dispense Refill  . acetaminophen (TYLENOL) 500 MG tablet Take 1,000 mg by mouth every 6 (six) hours as needed for pain.    Marland Kitchen ALPRAZolam (XANAX) 1 MG tablet Take 1 mg by mouth 3 (three) times daily as needed for anxiety.     Marland Kitchen amLODipine-valsartan (EXFORGE) 10-320 MG tablet TK 1 T PO D  1  . aspirin EC 81 MG tablet Take 81 mg by mouth every morning.     Marland Kitchen atorvastatin (LIPITOR) 40 MG tablet Take 1 tablet (40 mg total) by mouth daily. 90 tablet 3  . blood glucose meter kit and supplies KIT E11.9 Test once daily 1 each 0  . esomeprazole (NEXIUM) 20 MG capsule Take 20 mg by mouth as needed (Heartburn).     . hydrochlorothiazide (HYDRODIURIL) 25 MG tablet Take 1 tablet (25 mg total) daily by mouth. 90 tablet 3  . ibuprofen (ADVIL,MOTRIN) 200 MG tablet Take 800 mg by mouth every 6 (six) hours as needed for moderate pain.    . metFORMIN (GLUCOPHAGE) 500 MG tablet Take 1 tablet (500 mg total) by mouth 2 (two) times  daily with a meal. 180 tablet 0  . metoprolol succinate (TOPROL-XL) 100 MG 24 hr tablet Take 1 tablet (100 mg total) by mouth daily. Take with or immediately following a meal. 90 tablet 3  . Multiple Vitamin (MULTIVITAMIN WITH MINERALS) TABS tablet Take 1 tablet by mouth daily.    . simvastatin (ZOCOR) 40 MG tablet TK 1 T PO  QD  2  . meloxicam (MOBIC) 7.5 MG tablet Take 1 tablet (7.5 mg total) by mouth daily. (Patient not taking: Reported on 05/17/2017) 30 tablet 2   No current facility-administered medications on file prior to visit.     Past Medical History:  Diagnosis Date  . Abdominal aortic aneurysm (AAA) 3.0 cm to 5.0 cm in diameter in female [facility])   . Hiatal hernia    small  . Hyperlipidemia   . Hypertension   . Schatzki's ring   . Tremor     Past Surgical History:  Procedure Laterality Date  . CHOLECYSTECTOMY    . COLONOSCOPY    06/16/2004   YQM:VHQION rectum/Diminutive polyps of the splenic flexure and the cecum/The remainder of the colonic mucosa appeared normal pathology (hyperplastic polyps).  . COLONOSCOPY N/A 08/07/2015   Procedure: COLONOSCOPY;  Surgeon: Daneil Dolin,  MD;  Location: [facility];  Service: Endoscopy;  Laterality: N/A;  11:30 Am  . ESOPHAGOGASTRODUODENOSCOPY  09/11/2001   XUX:YBFXOVANV ring otherwise normal esophagus/Normal stomach/Normal D1 and D2 status post passage of a 56 [place] dilator  . ESOPHAGOGASTRODUODENOSCOPY (EGD) WITH ESOPHAGEAL DILATION N/A 01/25/2013   Dr. Gala Romney- schatzki's ring- 38 F dilation. small hiatal hernia  . TUBAL LIGATION      Family History  Problem Relation Age of Onset  . Diabetes Other   . Diabetes Mother   . Heart disease Mother   . Heart disease Father   . Diabetes Sister   . Diabetes Brother   . Hypertension Brother   . Diabetes Daughter   . Hypertension Maternal Grandmother   . Stroke Maternal Grandfather   . Early death Paternal Grandfather   . Colon cancer Neg Hx   . Liver disease Neg Hx       Social History   Socioeconomic History  . Marital status: Single    Spouse name: None  . Number of children: 3  . Years of education: None  . Highest education level: None  Social Needs  . Financial resource strain: None  . Food insecurity - worry: None  . Food insecurity - inability: None  . Transportation needs - medical: None  . Transportation needs - non-medical: None  Occupational History  . Occupation: English as a second language teacher: [employer]  Tobacco Use  . Smoking status: Former Smoker    Packs/day: 0.25    Years: 35.00    Pack years: 8.75    Types: Cigarettes    Last attempt to quit: 12/31/2012    Years since quitting: 4.3  . Smokeless tobacco: Never Used  Substance and Sexual Activity  . Alcohol use: Yes    Comment: occasionaly  . Drug use: No  . Sexual activity: None  Other Topics Concern  . None  Social History Narrative   Eats all food groups.    Wears seatbelt.    Has 3 children.   Prior smoker.   Attends church.     Review of Systems  Constitutional: Negative for activity change, appetite change and fever.  Eyes: Negative for visual disturbance.  Respiratory: Negative for cough, chest tightness and shortness of breath.   Cardiovascular: Negative for chest pain, palpitations and leg swelling.  Gastrointestinal: Negative for abdominal pain, nausea and vomiting.  Genitourinary: Negative for dysuria, frequency and urgency.  Neurological: Negative for dizziness, syncope and light-headedness.  Hematological: Negative for adenopathy.     Objective:   BP 140/62 (BP Location: Left Arm, Patient Position: Sitting, Cuff Size: Normal)   Pulse 99   Temp 97.9 F (36.6 C) (Temporal)   Resp 16   Ht 5' 6" (1.676 m)   Wt 181 lb 4 oz (82.2 kg)   SpO2 94%   BMI 29.25 kg/m   Physical Exam  Constitutional: She is oriented to person, place, and time. She appears well-developed and well-nourished. No distress.  HENT:  Head: Normocephalic and  atraumatic.  Eyes: Pupils are equal, round, and reactive to light.  Neck: Normal range of motion. Neck supple. No thyromegaly present.  Cardiovascular: Normal rate, regular rhythm and normal heart sounds.  Pulmonary/Chest: Effort normal and breath sounds normal. No respiratory distress.  Neurological: She is alert and oriented to person, place, and time. No cranial nerve deficit.  Skin: Skin is warm and dry.  Nursing note and vitals reviewed.    Assessment and Plan  1. White coat  syndrome with diagnosis of hypertension Patient with documented control blood pressures at home on her blood pressure medications and elevated reading when she goes to physician office visits.  I do believe that patient has an element of whitecoat syndrome with controlled hypertension.  At this time she will continue to check her blood pressure at home.  I did discuss with her that she does not need to check her blood pressure 3 times a day.  She does wish to continue checking it sporadically and will call if her blood pressures are running greater than 140/90.  She will also continue to work on her diet and exercise.  We did discuss the Dash diet eating plan today. 2. Essential hypertension To new all 3 medications as directed.  Prior to her next office visit she will return to clinic for recheck of BMP. - Basic metabolic panel  3. Hyperlipidemia LDL goal <70 In November, patient is LDL was not to goal.  We increased her dose and placed her on Lipitor at that time.  We will plan on rechecking prior to her next office visit. - Lipid panel  4. Type 2 diabetes mellitus without complication, without long-term current use of insulin (HCC) Currently at goal.  Continue current management and monitor as needed.  Check labs prior to visit - Hemoglobin A1c  5. High risk medication use Monitor due to statin use. - Hepatic function panel   Return in about 2 months (around 07/15/2017) for Follow-up/complete physical  exam. Caren Macadam, MD 05/17/2017

## 2017-06-07 DIAGNOSIS — M6281 Muscle weakness (generalized): Secondary | ICD-10-CM | POA: Diagnosis not present

## 2017-06-07 DIAGNOSIS — M25512 Pain in left shoulder: Secondary | ICD-10-CM | POA: Diagnosis not present

## 2017-06-07 DIAGNOSIS — M1990 Unspecified osteoarthritis, unspecified site: Secondary | ICD-10-CM | POA: Diagnosis not present

## 2017-06-07 DIAGNOSIS — M25612 Stiffness of left shoulder, not elsewhere classified: Secondary | ICD-10-CM | POA: Diagnosis not present

## 2017-06-08 ENCOUNTER — Encounter: Payer: Self-pay | Admitting: Orthopedic Surgery

## 2017-06-08 ENCOUNTER — Ambulatory Visit: Payer: BLUE CROSS/BLUE SHIELD | Admitting: Orthopedic Surgery

## 2017-06-08 VITALS — BP 149/80 | HR 78 | Ht 66.0 in | Wt 181.0 lb

## 2017-06-08 DIAGNOSIS — M19019 Primary osteoarthritis, unspecified shoulder: Secondary | ICD-10-CM | POA: Diagnosis not present

## 2017-06-08 NOTE — Progress Notes (Signed)
Routine follow-up visit  Chief Complaint  Patient presents with  . Follow-up    Recheck on left shoulder.    62 year old female 1 year history of left shoulder pain treated with physical therapy and anti-inflammatory medication.  She reports that she has made significant improvement just has a little bit of discomfort  Review of systems she complains of clicking of the right TMJ  Exam shows full forward elevation internal and external rotation no pain, strength rotator cuff right to left 5 out of 5 to manual muscle testing  Her x-ray showed a small inferior osteophyte normal glenohumeral joint space mild sclerosis of the greater tuberosity  She has improved we will see her in a year for x-ray to monitor the arthritis and she can call if there is any change in her symptoms before that time  Encounter Diagnosis  Name Primary?  Marland Kitchen Arthritis, shoulder region Yes

## 2017-06-27 ENCOUNTER — Other Ambulatory Visit: Payer: Self-pay | Admitting: Family Medicine

## 2017-07-18 DIAGNOSIS — E119 Type 2 diabetes mellitus without complications: Secondary | ICD-10-CM | POA: Diagnosis not present

## 2017-07-18 DIAGNOSIS — E785 Hyperlipidemia, unspecified: Secondary | ICD-10-CM | POA: Diagnosis not present

## 2017-07-18 DIAGNOSIS — I10 Essential (primary) hypertension: Secondary | ICD-10-CM | POA: Diagnosis not present

## 2017-07-18 DIAGNOSIS — Z79899 Other long term (current) drug therapy: Secondary | ICD-10-CM | POA: Diagnosis not present

## 2017-07-19 ENCOUNTER — Encounter: Payer: Self-pay | Admitting: Family Medicine

## 2017-07-19 LAB — LIPID PANEL
Cholesterol: 145 mg/dL (ref <200)
HDL: 50 mg/dL — ABNORMAL LOW (ref >50)
LDL Cholesterol (Calc): 79 mg/dL (calc)
Non-HDL Cholesterol (Calc): 95 mg/dL (calc) (ref <130)
Total CHOL/HDL Ratio: 2.9 (calc) (ref <5.0)
Triglycerides: 75 mg/dL (ref <150)

## 2017-07-19 LAB — HEPATIC FUNCTION PANEL
AG Ratio: 1.8 (calc) (ref 1.0–2.5)
ALT: 21 U/L (ref 6–29)
AST: 17 U/L (ref 10–35)
Albumin: 4.4 g/dL (ref 3.6–5.1)
Alkaline phosphatase (APISO): 91 U/L (ref 33–130)
Bilirubin, Direct: 0.1 mg/dL (ref 0.0–0.2)
Globulin: 2.5 g/dL (calc) (ref 1.9–3.7)
Indirect Bilirubin: 0.5 mg/dL (calc) (ref 0.2–1.2)
Total Bilirubin: 0.6 mg/dL (ref 0.2–1.2)
Total Protein: 6.9 g/dL (ref 6.1–8.1)

## 2017-07-19 LAB — HEMOGLOBIN A1C
Hgb A1c MFr Bld: 6.1 % of total Hgb — ABNORMAL HIGH (ref <5.7)
Mean Plasma Glucose: 128 (calc)
eAG (mmol/L): 7.1 (calc)

## 2017-07-19 LAB — BASIC METABOLIC PANEL
BUN: 12 mg/dL (ref 7–25)
CO2: 32 mmol/L (ref 20–32)
Calcium: 9.7 mg/dL (ref 8.6–10.4)
Chloride: 103 mmol/L (ref 98–110)
Creat: 0.89 mg/dL (ref 0.50–0.99)
Glucose, Bld: 106 mg/dL — ABNORMAL HIGH (ref 65–99)
Potassium: 4 mmol/L (ref 3.5–5.3)
Sodium: 141 mmol/L (ref 135–146)

## 2017-07-26 ENCOUNTER — Encounter: Payer: Self-pay | Admitting: Internal Medicine

## 2017-07-26 ENCOUNTER — Encounter: Payer: Self-pay | Admitting: Family Medicine

## 2017-07-26 ENCOUNTER — Other Ambulatory Visit: Payer: Self-pay

## 2017-07-26 ENCOUNTER — Ambulatory Visit (INDEPENDENT_AMBULATORY_CARE_PROVIDER_SITE_OTHER): Payer: BLUE CROSS/BLUE SHIELD | Admitting: Family Medicine

## 2017-07-26 ENCOUNTER — Other Ambulatory Visit (HOSPITAL_COMMUNITY)
Admission: RE | Admit: 2017-07-26 | Discharge: 2017-07-26 | Disposition: A | Discharge status: Home / Self Care | Payer: BLUE CROSS/BLUE SHIELD | Source: Ambulatory Visit | Attending: Family Medicine | Admitting: Family Medicine

## 2017-07-26 VITALS — BP 144/78 | HR 75 | Temp 98.1°F | Resp 16 | Ht 66.0 in | Wt 180.2 lb

## 2017-07-26 DIAGNOSIS — H6122 Impacted cerumen, left ear: Secondary | ICD-10-CM

## 2017-07-26 DIAGNOSIS — Z0000 Encounter for general adult medical examination without abnormal findings: Secondary | ICD-10-CM

## 2017-07-26 DIAGNOSIS — E785 Hyperlipidemia, unspecified: Secondary | ICD-10-CM | POA: Diagnosis not present

## 2017-07-26 DIAGNOSIS — E118 Type 2 diabetes mellitus with unspecified complications: Secondary | ICD-10-CM | POA: Diagnosis not present

## 2017-07-26 DIAGNOSIS — G4733 Obstructive sleep apnea (adult) (pediatric): Secondary | ICD-10-CM | POA: Diagnosis not present

## 2017-07-26 DIAGNOSIS — Z23 Encounter for immunization: Secondary | ICD-10-CM

## 2017-07-26 DIAGNOSIS — Z124 Encounter for screening for malignant neoplasm of cervix: Secondary | ICD-10-CM | POA: Insufficient documentation

## 2017-07-26 DIAGNOSIS — J302 Other seasonal allergic rhinitis: Secondary | ICD-10-CM

## 2017-07-26 DIAGNOSIS — K222 Esophageal obstruction: Secondary | ICD-10-CM

## 2017-07-26 DIAGNOSIS — K5904 Chronic idiopathic constipation: Secondary | ICD-10-CM

## 2017-07-26 MED ORDER — LINACLOTIDE 72 MCG PO CAPS: 72.0000 ug | ORAL_CAPSULE | Freq: Every day | ORAL | 1 refills | Status: DC

## 2017-07-26 MED ORDER — FLUTICASONE PROPIONATE 50 MCG/ACT NA SUSP: 2.0000 | Freq: Every day | NASAL | 6 refills | Status: DC

## 2017-07-26 NOTE — Progress Notes (Signed)
Patient ID: [name], female    DOB: 05-Jan-1956, 62 y.o.   MRN: [id number]  Chief Complaint  Patient presents with  . Annual Exam    Allergies Ciprofloxacin  Subjective:   [name] is a 62 y.o. female who presents to [facility] today.  HPI Here for CPE with pap, pelvic, and breast exam.  Reports that she is doing well.  Taking all her medications as directed.  Is followed by vascular surgery secondary to aortic aneurysm and carotid atherosclerosis.  Is followed by cardiology and gastroenterology.  Reports that over the past month she has had some occasional difficulty with swallowing and feeling like her food gets stuck.  She does have a history of Schatzki's ring status post esophageal dilatation by Dr. Buford Dresser in 2014.  She reports that her symptoms are not terrible but are similar to what she has had in the past.  She denies any melena or bright red blood per rectum.  Denies any abdominal pain.  Bowel movements are regular.  Had a colonoscopy performed in 2017.  Vision exam is up-to-date.  Has upper and lower partials and does not routinely see the dentist.  Denies any lesions in her oral cavity.  Would like to get her Pap smear performed today and would like to be checked for any vaginal sexually transmitted infections.  Denies any vaginal discharge at this time.  Reports her blood pressures been running well and she is taking all her medications.  She denies any cough.  She did used to smoke cigarettes, approximate 9-pack-year history.  Did have a history of diabetes in the past after being placed on prednisone.  After she was stopped on the prednisone and made lifestyle changes she reports that her blood sugars came down.  She has been taking the metformin twice a day as directed.  Denies any hypoglycemic episodes.  Tries to watch her diet and eat low carbohydrates.  [facility]  Reports that she has been seen by sleep  medicine in the past and he is to use a sleep apnea machine.  Reports that she did have a sleep study done a year ago but never followed up.  Review of the chart reveals that she was diagnosed with mild obstructive sleep apnea and the physician wanted her to follow back up for an office visit.  She reports that she believes she still suffers from sleep apnea and has some snoring at night.  Has felt like her left ear is been clogged.  Would like that to be checked today.  Denies any ear pain.  Reports that she has had some sinus congestion and has a history of seasonal allergies.  Reports she has been using saline nasal spray but for the past week her nose has been congested and she has had some drainage.  Reports she has some itchiness in her ears and her nose.  Has used nasal spray in the past and it is helped.  Is unable to use a decongestant because of her blood pressure.  Would like a refill on Linzess.  Has used it on and off for years.  Helps with constipation.  Has had colonoscopy and up-to-date.    Past Medical History:  Diagnosis Date  . Abdominal aortic aneurysm (AAA) 3.0 cm to 5.0 cm in diameter in female [facility])   . Hiatal hernia    small  . Hyperlipidemia   . Hypertension   .  Schatzki's ring   . Tremor     Past Surgical History:  Procedure Laterality Date  . CHOLECYSTECTOMY    . COLONOSCOPY    06/16/2004   [id number] rectum/Diminutive polyps of the splenic flexure and the cecum/The remainder of the colonic mucosa appeared normal pathology (hyperplastic polyps).  . COLONOSCOPY N/A 08/07/2015   Procedure: COLONOSCOPY;  Surgeon: Daneil Dolin, MD;  Location: [facility];  Service: Endoscopy;  Laterality: N/A;  11:30 Am  . ESOPHAGOGASTRODUODENOSCOPY  09/11/2001   VOZ:[id number] ring otherwise normal esophagus/Normal stomach/Normal D1 and D2 status post passage of a 56 [place] Maloney dilator  . ESOPHAGOGASTRODUODENOSCOPY (EGD) WITH ESOPHAGEAL DILATION N/A 01/25/2013   Dr. Gala Romney-  schatzki's ring- 50 F dilation. small hiatal hernia  . TUBAL LIGATION      Family History  Problem Relation Age of Onset  . Diabetes Other   . Diabetes Mother   . Heart disease Mother   . Heart disease Father   . Diabetes Sister   . Diabetes Brother   . Hypertension Brother   . Diabetes Daughter   . Hypertension Maternal Grandmother   . Stroke Maternal Grandfather   . Early death Paternal Grandfather   . Colon cancer Neg Hx   . Liver disease Neg Hx      Social History   Socioeconomic History  . Marital status: Single    Spouse name: None  . Number of children: 3  . Years of education: None  . Highest education level: None  Social Needs  . Financial resource strain: None  . Food insecurity - worry: None  . Food insecurity - inability: None  . Transportation needs - medical: None  . Transportation needs - non-medical: None  Occupational History  . Occupation: English as a second language teacher: [employer]  Tobacco Use  . Smoking status: Former Smoker    Packs/day: 0.25    Years: 35.00    Pack years: 8.75    Types: Cigarettes    Last attempt to quit: 12/31/2012    Years since quitting: 4.5  . Smokeless tobacco: Never Used  Substance and Sexual Activity  . Alcohol use: Yes    Comment: occasionaly  . Drug use: No  . Sexual activity: Yes    Partners: Male    Birth control/protection: Post-menopausal  Other Topics Concern  . None  Social History Narrative   Eats all food groups.    Wears seatbelt.    Has 3 children.   Prior smoker.   Attends church.    Used to work in [employer] fund[employer].    Drives.   Enjoys going out with family.     Review of Systems  Constitutional: Negative for activity change, appetite change and fever.  HENT: Positive for sinus pressure and sinus pain.   Eyes: Negative for visual disturbance.  Respiratory: Negative for cough, chest tightness and shortness of breath.   Cardiovascular: Negative for chest pain, palpitations and leg swelling.    Gastrointestinal: Negative for abdominal distention, abdominal pain, blood in stool, constipation, diarrhea, nausea, rectal pain and vomiting.  Endocrine: Negative for polyphagia and polyuria.  Genitourinary: Negative for dyspareunia, dysuria, flank pain, frequency, genital sores, hematuria and urgency.  Musculoskeletal: Negative for arthralgias, gait problem and joint swelling.  Skin: Negative for rash.  Neurological: Negative for dizziness, syncope and light-headedness.  Hematological: Negative for adenopathy.  Psychiatric/Behavioral: Positive for sleep disturbance. Negative for agitation, decreased concentration, dysphoric mood, self-injury and suicidal ideas. The patient is not nervous/anxious and is not hyperactive.  Objective:   BP (!) 144/78 (BP Location: Left Arm, Patient Position: Sitting, Cuff Size: Normal)   Pulse 75   Temp 98.1 F (36.7 C) (Temporal)   Resp 16   Ht 5\' 6"  (1.676 m)   Wt 180 lb 4 oz (81.8 kg)   SpO2 98%   BMI 29.09 kg/m   Physical Exam  Constitutional: She is oriented to person, place, and time. She appears well-developed and well-nourished. No distress.  HENT:  Head: Normocephalic and atraumatic.  Right Ear: Hearing, tympanic membrane, external ear and ear canal normal.  Left Ear: Hearing, tympanic membrane, external ear and ear canal normal.  Left ear canal obstructed with wax.  Ear canal curetted and lavaged.  Earwax removed.  Tympanic membrane visualized.  No trauma with cerumen removal.  Hearing better status post.  Right ear canal without cerumen and tympanic membrane within normal limits.  Eyes: Pupils are equal, round, and reactive to light.  Neck: Normal range of motion. Neck supple. No thyromegaly present.  Cardiovascular: Normal rate, regular rhythm and normal heart sounds.  Pulmonary/Chest: Effort normal and breath sounds normal. No respiratory distress. She exhibits no mass and no tenderness. Right breast exhibits no inverted nipple, no  mass, no nipple discharge, no skin change and no tenderness. Left breast exhibits no inverted nipple, no mass, no nipple discharge, no skin change and no tenderness.  Abdominal: Hernia confirmed negative in the right inguinal area and confirmed negative in the left inguinal area.  Genitourinary: Vagina normal and uterus normal. There is no rash, tenderness or lesion on the right labia. There is no rash, tenderness or lesion on the left labia. Cervix exhibits no motion tenderness, no discharge and no friability. Right adnexum displays no mass, no tenderness and no fullness. Left adnexum displays no mass, no tenderness and no fullness. No erythema in the vagina. No foreign body in the vagina. No signs of injury around the vagina. No vaginal discharge found.  Feet:  Right Foot:  Protective Sensation: 6 sites tested. 6 sites sensed.  Skin Integrity: Negative for ulcer, blister, skin breakdown or erythema.  Left Foot:  Protective Sensation: 6 sites tested. 6 sites sensed.  Skin Integrity: Negative for ulcer, blister, skin breakdown or erythema.  Lymphadenopathy: No inguinal adenopathy noted on the right or left side.  Neurological: She is alert and oriented to person, place, and time. No cranial nerve deficit.  Skin: Skin is warm and dry.  Nursing note and vitals reviewed.    Assessment and Plan  1. Well adult health check Age-appropriate anticipatory guidance given and discussed with patient. Continue use of current medications and follow-up with specialist as recommended. Dentist visits recommended every 6 months.  2. Seasonal allergies Flonase 2 sprays each nostril daily.Patient counseled in detail regarding the risks of medication. Told to call or return to clinic if develop any worrisome signs or symptoms. Patient voiced understanding.   - fluticasone (FLONASE) 50 MCG/ACT nasal spray; Place 2 sprays into both nostrils daily.  Dispense: 16 g; Refill: 6  3. Immunization due Vaccination  given - Pneumococcal conjugate vaccine 13-valent IM  4. Cervical cancer screening Pap smear performed.  Pap smear testing for gonorrhea, chlamydia, trichomonas, bacterial vaginosis, and yeast ordered.  HPV testing reflexive ordered. - Cytology - PAP  5. Hyperlipidemia LDL goal <70 LDL goal less than 70.  Continue current medication as recommended.  Plan to recheck FLP in 6 months.   6. OSA (obstructive sleep apnea) Discussed with patient and referral placed for  her to follow-up with previous sleep medicine physician. - Ambulatory referral to Sleep Studies  7. Chronic idiopathic constipation Patient understands risks of this medication.  She will continue it on a as needed basis.  Black box warnings and side effects discussed today. - linaclotide (LINZESS) 72 MCG capsule; Take 1 capsule (72 mcg total) by mouth daily before breakfast.  Dispense: 30 capsule; Refill: 1  8. Schatzki's ring Referral to GI secondary to previous history and return of some minimal dysphasia present.  Discussed food choices today. - Ambulatory referral to Gastroenterology  9. Controlled type 2 diabetes mellitus with complication, without long-term current use of insulin (Marie) Foot exam done.  Eye exam recommended.  Continue to monitor hemoglobin A1c every 6 months.  Continue metformin.  Diet, exercise, and weight loss discussed. - Ambulatory referral to Ophthalmology  10. Impacted cerumen of left ear Ears curetted/lavaged today with resolution of symptoms.  Ear care discussed  Return in about 3 months (around 10/26/2017) for Follow-up. Caren Macadam, MD 07/26/2017

## 2017-07-28 LAB — CYTOLOGY - PAP
Bacterial vaginitis: POSITIVE — AB
Candida vaginitis: NEGATIVE
Chlamydia: NEGATIVE
Diagnosis: NEGATIVE
Neisseria Gonorrhea: NEGATIVE
Trichomonas: NEGATIVE

## 2017-07-29 ENCOUNTER — Telehealth: Payer: Self-pay | Admitting: Family Medicine

## 2017-07-29 MED ORDER — METRONIDAZOLE 500 MG PO TABS: 500.0000 mg | ORAL_TABLET | Freq: Two times a day (BID) | ORAL | 0 refills | Status: DC

## 2017-07-29 NOTE — Telephone Encounter (Signed)
Please call and advised that her Pap smear was within normal limits.  There is no evidence of cancerous or precancerous cells.  However, it did reveal infection with bacterial vaginosis.  This is not a sexually transmitted disease but it is an overgrowth of bacteria in the vaginal area which can calls itching, discharge and vaginal odor.  She does need to be treated for this infection.  She should take the medication Flagyl 500 mg, 1 p.o. twice daily times 7 days.  She cannot drink alcohol while taking this medication or within 3 days of stopping the medication.  Please keep scheduled follow-up and will review all labs and testing at that time.

## 2017-07-29 NOTE — Telephone Encounter (Signed)
Called patient regarding message below. No answer, left generic message for patient to return call.   

## 2017-08-02 NOTE — Telephone Encounter (Signed)
Called patient regarding message below. No answer, left generic message for patient to return call.   

## 2017-08-03 NOTE — Telephone Encounter (Signed)
Patient informed of message below, verbalized understanding.  

## 2017-08-08 ENCOUNTER — Other Ambulatory Visit: Payer: Self-pay

## 2017-08-08 MED ORDER — METRONIDAZOLE 500 MG PO TABS: 500.0000 mg | ORAL_TABLET | Freq: Two times a day (BID) | ORAL | 0 refills | Status: DC

## 2017-08-12 DIAGNOSIS — E119 Type 2 diabetes mellitus without complications: Secondary | ICD-10-CM | POA: Diagnosis not present

## 2017-08-30 DIAGNOSIS — H2511 Age-related nuclear cataract, right eye: Secondary | ICD-10-CM | POA: Diagnosis not present

## 2017-09-13 ENCOUNTER — Institutional Professional Consult (permissible substitution): Payer: Self-pay | Admitting: Neurology

## 2017-09-13 DIAGNOSIS — H25812 Combined forms of age-related cataract, left eye: Secondary | ICD-10-CM | POA: Diagnosis not present

## 2017-09-13 DIAGNOSIS — H2512 Age-related nuclear cataract, left eye: Secondary | ICD-10-CM | POA: Diagnosis not present

## 2017-09-22 DIAGNOSIS — H25811 Combined forms of age-related cataract, right eye: Secondary | ICD-10-CM | POA: Diagnosis not present

## 2017-09-22 DIAGNOSIS — H2511 Age-related nuclear cataract, right eye: Secondary | ICD-10-CM | POA: Diagnosis not present

## 2017-09-25 ENCOUNTER — Other Ambulatory Visit: Payer: Self-pay | Admitting: Family Medicine

## 2017-09-25 DIAGNOSIS — E119 Type 2 diabetes mellitus without complications: Secondary | ICD-10-CM

## 2017-09-30 ENCOUNTER — Encounter: Payer: Self-pay | Admitting: *Deleted

## 2017-09-30 ENCOUNTER — Other Ambulatory Visit: Payer: Self-pay | Admitting: *Deleted

## 2017-09-30 ENCOUNTER — Ambulatory Visit: Payer: BLUE CROSS/BLUE SHIELD | Admitting: Gastroenterology

## 2017-09-30 ENCOUNTER — Encounter: Payer: Self-pay | Admitting: Gastroenterology

## 2017-09-30 ENCOUNTER — Telehealth: Payer: Self-pay | Admitting: *Deleted

## 2017-09-30 VITALS — BP 158/70 | HR 64 | Temp 97.8°F | Ht 67.0 in | Wt 178.8 lb

## 2017-09-30 DIAGNOSIS — R0989 Other specified symptoms and signs involving the circulatory and respiratory systems: Secondary | ICD-10-CM

## 2017-09-30 DIAGNOSIS — R1310 Dysphagia, unspecified: Secondary | ICD-10-CM

## 2017-09-30 DIAGNOSIS — R1319 Other dysphagia: Secondary | ICD-10-CM

## 2017-09-30 NOTE — Progress Notes (Signed)
Primary Care Physician:  Caren Macadam, MD Primary Gastroenterologist:  Dr. Gala Romney   Chief Complaint  Patient presents with  . Abdominal Pain    quiver feeling across her upper abdomen    HPI:   [name] is a 62 y.o. female presenting today at the request of Dr. Mannie Stabile due to dysphagia. She has a history of dilations in the past with Schatzki's ring, last in 2014. Colonoscopy up-to-date and due again in 2022.   States she notes a "quiver" in her upper abdomen for about 6 months. Will wake her up at night. Not painful. Feels like something running through. No NSAIDs. Just tylenol. Solid food dysphagia  feels similar to 2014. Has noted improvement with dilations historically. No odynophagia. Nexium 20 mg daily. Notes loss of appetite for about a month. Retired about a year ago. Denies any stress. Purposeful weight loss. If lays on her stomach, will break out into a sweat. She is concerned about an abdominal aneurysm. This is not noted on prior imaging.   Taking Linzess 72 mcg about 3 times a week, which works well for her.    Past Medical History:  Diagnosis Date  . Abdominal aortic aneurysm (AAA) 3.0 cm to 5.0 cm in diameter in female [facility])   . Hiatal hernia    small  . Hyperlipidemia   . Hypertension   . Pre-diabetes   . Schatzki's ring   . Tremor     Past Surgical History:  Procedure Laterality Date  . CHOLECYSTECTOMY    . COLONOSCOPY    06/16/2004   HCW:[id number] rectum/Diminutive polyps of the splenic flexure and the cecum/The remainder of the colonic mucosa appeared normal pathology (hyperplastic polyps).  . COLONOSCOPY N/A 08/07/2015   Dr. Gala Romney: 6 mm descending colon tubular adenoma, multiple medium-mouthed diverticula in sigmoid colon. surveillance 2022  . ESOPHAGOGASTRODUODENOSCOPY  09/11/2001   [id number]:[id number] ring otherwise normal esophagus/Normal stomach/Normal D1 and D2 status post passage of a 56 [place] Maloney dilator  . ESOPHAGOGASTRODUODENOSCOPY (EGD)  WITH ESOPHAGEAL DILATION N/A 01/25/2013   Dr. Gala Romney- schatzki's ring- 62 F dilation. small hiatal hernia  . TUBAL LIGATION      Current Outpatient Medications  Medication Sig Dispense Refill  . acetaminophen (TYLENOL) 500 MG tablet Take 1,000 mg by mouth every 6 (six) hours as needed for pain.    Marland Kitchen ALPRAZolam (XANAX) 1 MG tablet Take 1 mg by mouth 3 (three) times daily as needed for anxiety.     Marland Kitchen amLODipine-valsartan (EXFORGE) 10-320 MG tablet TAKE 1 TABLET BY MOUTH DAILY 90 tablet 0  . aspirin EC 81 MG tablet Take 81 mg by mouth every morning.     Marland Kitchen atorvastatin (LIPITOR) 40 MG tablet Take 1 tablet (40 mg total) by mouth daily. 90 tablet 3  . cetirizine (ZYRTEC) 10 MG tablet Take 10 mg by mouth daily as needed for allergies.    Marland Kitchen esomeprazole (NEXIUM) 20 MG capsule Take 20 mg by mouth daily.     . fluticasone (FLONASE) 50 MCG/ACT nasal spray Place 2 sprays into both nostrils daily. 16 g 6  . hydrochlorothiazide (HYDRODIURIL) 25 MG tablet Take 1 tablet (25 mg total) daily by mouth. 90 tablet 3  . linaclotide (LINZESS) 72 MCG capsule Take 1 capsule (72 mcg total) by mouth daily before breakfast. 30 capsule 1  . metFORMIN (GLUCOPHAGE) 500 MG tablet TAKE 1 TABLET(500 MG) BY MOUTH TWICE DAILY WITH A MEAL 180 tablet 0  . metoprolol succinate (TOPROL-XL) 100 MG 24 hr  tablet Take 1 tablet (100 mg total) by mouth daily. Take with or immediately following a meal. 90 tablet 3  . Multiple Vitamin (MULTIVITAMIN WITH MINERALS) TABS tablet Take 1 tablet by mouth daily.    . NON FORMULARY Potassium 20 meq   One tablet daily    . NON FORMULARY OTC  Chest congestion relief    Every 4 hours as needed    . blood glucose meter kit and supplies KIT E11.9 Test once daily 1 each 0   No current facility-administered medications for this visit.     Allergies as of 09/30/2017 - Review Complete 09/30/2017  Allergen Reaction Noted  . Ciprofloxacin Shortness Of Breath 12/27/2011    Family History  Problem  Relation Age of Onset  . Diabetes Other   . Diabetes Mother   . Heart disease Mother   . Heart disease Father   . Diabetes Sister   . Diabetes Brother   . Hypertension Brother   . Diabetes Daughter   . Hypertension Maternal Grandmother   . Stroke Maternal Grandfather   . Early death Paternal Grandfather   . Colon cancer Neg Hx   . Liver disease Neg Hx     Social History   Socioeconomic History  . Marital status: Single    Spouse name: Not on file  . Number of children: 3  . Years of education: Not on file  . Highest education level: Not on file  Occupational History  . 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 Financial resource strain: Not on file  . Food insecurity:    Worry: Not on file    Inability: Not on file  . Transportation needs:    Medical: Not on file    Non-medical: Not on file  Tobacco Use  . Smoking status: Former Smoker    Packs/day: 0.25    Years: 35.00    Pack years: 8.75    Types: Cigarettes    Last attempt to quit: 12/31/2012    Years since quitting: 4.7  . Smokeless tobacco: Never Used  Substance and Sexual Activity  . Alcohol use: Yes    Comment: occasionaly  . Drug use: No  . Sexual activity: Yes    Partners: Male    Birth control/protection: Post-menopausal  Lifestyle  . Physical activity:    Days per week: Not on file    Minutes per session: Not on file  . Stress: Not on file  Relationships  . Social connections:    Talks on phone: Not on file    Gets together: Not on file    Attends religious service: Not on file    Active member of club or organization: Not on file    Attends meetings of clubs or organizations: Not on file    Relationship status: Not on file  . Intimate partner violence:    Fear of current or ex partner: Not on file    Emotionally abused: Not on file    Physically abused: Not on file    Forced sexual activity: Not on file  Other Topics Concern  . Not on file  Social  History Narrative   Eats all food groups.    Wears seatbelt.    Has 3 children.   Prior smoker.   Attends church.    Used to work in [employer] fund[employer].    Drives.   Enjoys going out with family.     Review of Systems: Gen: see HPI  CV: Denies chest pain, heart palpitations, peripheral edema, syncope.  Resp: Denies shortness of breath at rest or with exertion. Denies wheezing or cough.  GI: see HPI  GU : Denies urinary burning, urinary frequency, urinary hesitancy MS: Denies joint pain, muscle weakness, cramps, or limitation of movement.  Derm: Denies rash, itching, dry skin Psych: Denies depression, anxiety, memory loss, and confusion Heme: Denies bruising, bleeding, and enlarged lymph nodes.  Physical Exam: BP (!) 158/70   Pulse 64   Temp 97.8 F (36.6 C) (Oral)   Ht '5\' 7"'  (1.702 m)   Wt 178 lb 12.8 oz (81.1 kg)   BMI 28.00 kg/m  General:   Alert and oriented. Pleasant and cooperative. Well-nourished and well-developed.  Head:  Normocephalic and atraumatic. Eyes:  Without icterus, sclera clear and conjunctiva pink.  Ears:  Normal auditory acuity. Nose:  No deformity, discharge,  or lesions. Mouth:  No deformity or lesions, oral mucosa pink.  Lungs:  Clear to auscultation bilaterally.  Heart:  S1, S2 present without murmurs appreciated.  Abdomen:  +BS, soft, non-tender and non-distended. No HSM noted. No guarding or rebound. No masses appreciated. Abdominal bruit to left of umbilicus Rectal:  Deferred  Msk:  Symmetrical without gross deformities. Normal posture. Extremities:  Without edema. Neurologic:  Alert and  oriented x4 Psych:  Alert and cooperative. Normal mood and affect.

## 2017-09-30 NOTE — Assessment & Plan Note (Signed)
62 year old pleasant female with history of dysphagia in the past s/p dilations with improvement historically. Last EGD in 2014 with Schatzki's ring s/p dilation. Now with recurrent dysphagia. Also notes a vague "quiver" upper abdomen that awakes her from sleep but no pain. Unclear etiology. GERD overall well controlled on Nexium. No other concerning features.   Proceed with upper endoscopy/dilation in the near future with Dr. Gala Romney. The risks, benefits, and alternatives have been discussed in detail with patient. They have stated understanding and desire to proceed.  Continue Nexium daily

## 2017-09-30 NOTE — Assessment & Plan Note (Signed)
Unremarkable prior imaging. Patient concerned regarding possible abdominal aneurysm. US abdomen ordered.

## 2017-09-30 NOTE — Telephone Encounter (Signed)
LMOVM. ultrasound scheduled for 10/07/17 at 9:30am, npo after midnight.

## 2017-09-30 NOTE — Patient Instructions (Signed)
We have scheduled you for an upper endoscopy with dilation by Dr. Gala Romney in the near future. Do not take metformin the day of the procedure.   I have ordered the abdominal ultrasound.  We will see you back in 3-4 months!  It was a pleasure to see you today. I strive to create trusting relationships with patients to provide genuine, compassionate, and quality care. I value your feedback. If you receive a survey regarding your visit,  I greatly appreciate you taking time to fill this out.   Annitta Needs, PhD, ANP-BC [facility]

## 2017-10-04 NOTE — Telephone Encounter (Signed)
Spoke with pt and is aware of appt details. Nothing further needed

## 2017-10-04 NOTE — Progress Notes (Signed)
CC'D TO PCP °

## 2017-10-07 ENCOUNTER — Ambulatory Visit (HOSPITAL_COMMUNITY)
Admission: RE | Admit: 2017-10-07 | Discharge: 2017-10-07 | Disposition: A | Discharge status: Home / Self Care | Payer: BLUE CROSS/BLUE SHIELD | Source: Ambulatory Visit | Attending: Gastroenterology | Admitting: Gastroenterology

## 2017-10-07 DIAGNOSIS — I77811 Abdominal aortic ectasia: Secondary | ICD-10-CM | POA: Insufficient documentation

## 2017-10-07 DIAGNOSIS — Z9049 Acquired absence of other specified parts of digestive tract: Secondary | ICD-10-CM | POA: Insufficient documentation

## 2017-10-07 DIAGNOSIS — R932 Abnormal findings on diagnostic imaging of liver and biliary tract: Secondary | ICD-10-CM | POA: Insufficient documentation

## 2017-10-07 DIAGNOSIS — R0989 Other specified symptoms and signs involving the circulatory and respiratory systems: Secondary | ICD-10-CM

## 2017-10-07 DIAGNOSIS — R93422 Abnormal radiologic findings on diagnostic imaging of left kidney: Secondary | ICD-10-CM | POA: Diagnosis not present

## 2017-10-07 DIAGNOSIS — R93421 Abnormal radiologic findings on diagnostic imaging of right kidney: Secondary | ICD-10-CM | POA: Insufficient documentation

## 2017-10-13 ENCOUNTER — Encounter: Payer: Self-pay | Admitting: Family Medicine

## 2017-10-14 ENCOUNTER — Encounter: Payer: Self-pay | Admitting: Family Medicine

## 2017-10-21 ENCOUNTER — Other Ambulatory Visit: Payer: Self-pay

## 2017-10-21 ENCOUNTER — Ambulatory Visit (HOSPITAL_COMMUNITY)
Admission: RE | Admit: 2017-10-21 | Discharge: 2017-10-21 | Disposition: A | Discharge status: Home / Self Care | Payer: BLUE CROSS/BLUE SHIELD | Source: Ambulatory Visit | Attending: Vascular Surgery | Admitting: Vascular Surgery

## 2017-10-21 ENCOUNTER — Encounter: Payer: Self-pay | Admitting: Vascular Surgery

## 2017-10-21 ENCOUNTER — Ambulatory Visit (INDEPENDENT_AMBULATORY_CARE_PROVIDER_SITE_OTHER): Payer: BLUE CROSS/BLUE SHIELD | Admitting: Vascular Surgery

## 2017-10-21 VITALS — BP 163/76 | HR 63 | Temp 97.1°F | Resp 16 | Ht 67.0 in | Wt 177.0 lb

## 2017-10-21 DIAGNOSIS — I6523 Occlusion and stenosis of bilateral carotid arteries: Secondary | ICD-10-CM

## 2017-10-21 NOTE — Progress Notes (Signed)
Patient ID: [name], female   DOB: May 11, 1955, 62 y.o.   MRN: [id number]  Reason for Consult: Carotid and AAA   Referred by Kimberly Macadam, MD  Subjective:     HPI:  [name] is a 62 y.o. female with known small aneurysm.  She was sent for further evaluation last year after noticing a carotid bruit.  She takes aspirin and a statin drug.  She has never had stroke TIA or amaurosis.  She has no new back or abdominal pain.  She remains very active.  She recently retired now spends time with her grandkids.  Carotid duplex performed prior to today's visit.  She was a longtime smoker quit in 2014.  Past Medical History:  Diagnosis Date  . Abdominal aortic aneurysm (AAA) 3.0 cm to 5.0 cm in diameter in female [facility])    None seen on prior imaging?   . Hiatal hernia    small  . Hyperlipidemia   . Hypertension   . Pre-diabetes   . Schatzki's ring   . Tremor    Family History  Problem Relation Age of Onset  . Diabetes Other   . Diabetes Mother   . Heart disease Mother   . Heart disease Father   . Diabetes Sister   . Diabetes Brother   . Hypertension Brother   . Diabetes Daughter   . Hypertension Maternal Grandmother   . Stroke Maternal Grandfather   . Early death Paternal Grandfather   . Colon cancer Neg Hx   . Liver disease Neg Hx    Past Surgical History:  Procedure Laterality Date  . CHOLECYSTECTOMY    . COLONOSCOPY    06/16/2004   DTO:IZTIWP rectum/Diminutive polyps of the splenic flexure and the cecum/The remainder of the colonic mucosa appeared normal pathology (hyperplastic polyps).  . COLONOSCOPY N/A 08/07/2015   Dr. Gala Williams: 6 mm descending colon tubular adenoma, multiple medium-mouthed diverticula in sigmoid colon. surveillance 2022  . ESOPHAGOGASTRODUODENOSCOPY  09/11/2001   YKD:XIPJASNKN ring otherwise normal esophagus/Normal stomach/Normal D1 and D2 status post passage of a 56 [place] Maloney dilator  . ESOPHAGOGASTRODUODENOSCOPY (EGD) WITH  ESOPHAGEAL DILATION N/A 01/25/2013   Dr. Gala Williams- schatzki's ring- 90 F dilation. small hiatal hernia  . TUBAL LIGATION      Short Social History:  Social History   Tobacco Use  . Smoking status: Former Smoker    Packs/day: 0.25    Years: 35.00    Pack years: 8.75    Types: Cigarettes    Last attempt to quit: 12/31/2012    Years since quitting: 4.8  . Smokeless tobacco: Never Used  Substance Use Topics  . Alcohol use: Yes    Comment: occasionaly    Allergies  Allergen Reactions  . Ciprofloxacin Shortness Of Breath    Current Outpatient Medications  Medication Sig Dispense Refill  . acetaminophen (TYLENOL) 500 MG tablet Take 1,000 mg by mouth every 6 (six) hours as needed for pain.    Marland Kitchen ALPRAZolam (XANAX) 1 MG tablet Take 1 mg by mouth 3 (three) times daily as needed for anxiety.     Marland Kitchen amLODipine-valsartan (EXFORGE) 10-320 MG tablet TAKE 1 TABLET BY MOUTH DAILY 90 tablet 0  . aspirin EC 81 MG tablet Take 81 mg by mouth every morning.     Marland Kitchen atorvastatin (LIPITOR) 40 MG tablet Take 1 tablet (40 mg total) by mouth daily. 90 tablet 3  . cetirizine (ZYRTEC) 10 MG tablet Take 10 mg by mouth daily as needed for  allergies.    Marland Kitchen esomeprazole (NEXIUM) 20 MG capsule Take 20 mg by mouth daily.     . fluticasone (FLONASE) 50 MCG/ACT nasal spray Place 2 sprays into both nostrils daily. 16 g 6  . hydrochlorothiazide (HYDRODIURIL) 25 MG tablet Take 1 tablet (25 mg total) daily by mouth. 90 tablet 3  . linaclotide (LINZESS) 72 MCG capsule Take 1 capsule (72 mcg total) by mouth daily before breakfast. 30 capsule 1  . metFORMIN (GLUCOPHAGE) 500 MG tablet TAKE 1 TABLET(500 MG) BY MOUTH TWICE DAILY WITH A MEAL 180 tablet 0  . metoprolol succinate (TOPROL-XL) 100 MG 24 hr tablet Take 1 tablet (100 mg total) by mouth daily. Take with or immediately following a meal. 90 tablet 3  . Multiple Vitamin (MULTIVITAMIN WITH MINERALS) TABS tablet Take 1 tablet by mouth daily.    . NON FORMULARY Potassium 20 meq    One tablet daily    . NON FORMULARY OTC  Chest congestion relief    Every 4 hours as needed    . PROLENSA 0.07 % SOLN STARTING AFTER CATARACT SURGERY PLACE 1 GTT IN OS ONCE D FOR 2 WEEKS THEN STOP  4  . blood glucose meter kit and supplies KIT E11.9 Test once daily (Patient not taking: Reported on 10/21/2017) 1 each 0  . prednisoLONE acetate (PRED FORTE) 1 % ophthalmic suspension PLACE 1 DROP INTO THE LEFT EYE QID THEN FOLLOW TAPER DIRECTIONS  0  . trimethoprim-polymyxin b (POLYTRIM) ophthalmic solution INT 1 GTT IN OS QID FOR 1 WK THEN STOP  0   No current facility-administered medications for this visit.     Review of Systems  Constitutional:  Constitutional negative. HENT: HENT negative.  Eyes: Eyes negative.  Respiratory: Respiratory negative.  Cardiovascular: Cardiovascular negative.  GI: Gastrointestinal negative.  Musculoskeletal: Musculoskeletal negative.  Skin: Skin negative.  Neurological: Neurological negative. Hematologic: Hematologic/lymphatic negative.  Psychiatric: Psychiatric negative.        Objective:  Objective   Vitals:   10/21/17 0856 10/21/17 0902  BP: (!) 170/79 (!) 163/76  Pulse: 63 63  Resp: 16   Temp: (!) 97.1 F (36.2 C)   TempSrc: Oral   SpO2: 99%   Weight: 177 lb (80.3 kg)   Height: '5\' 7"'  (1.702 m)    Body mass index is 27.72 kg/m.  Physical Exam  Constitutional: She is oriented to person, place, and time. She appears well-developed.  HENT:  Head: Normocephalic.  Eyes: Pupils are equal, round, and reactive to light.  Neck: Normal range of motion. Neck supple.  Cardiovascular: Normal rate.  Pulses:      Carotid pulses are 2+ on the right side, and 2+ on the left side.      Radial pulses are 2+ on the right side, and 2+ on the left side.       Popliteal pulses are 2+ on the right side, and 2+ on the left side.       Posterior tibial pulses are 2+ on the right side, and 2+ on the left side.  Pulmonary/Chest: Effort normal and breath  sounds normal.  Abdominal: Soft. Bowel sounds are normal. She exhibits no mass.  Lymphadenopathy:    She has no cervical adenopathy.  Neurological: She is alert and oriented to person, place, and time.  Skin: Skin is warm and dry.  Psychiatric: She has a normal mood and affect. Her behavior is normal. Judgment and thought content normal.    Data: I have interpreted her bilateral carotid artery  duplex which demonstrate 1 to 39% right and 40 to 95% left peak systolic velocity on the right 144 and left 170.     Assessment/Plan:     62 year old female follows up for carotid stenosis after bruit was detected last year.  She also has a small abdominal aortic aneurysm which we are following in 2 years and had some notable calcifications.  She has no lower extremity symptoms has no back or abdominal pain has not had any symptoms from her carotid blockages.  We will get her follow-up in 1 year with her aortoiliac duplex as well as repeat carotid duplex.  I discussed with her the signs and symptoms of aortic rupture that would require urgent evaluation as well as stroke or TIA.  She demonstrates good understanding.     [name] MD [facility]

## 2017-10-21 NOTE — Progress Notes (Signed)
Vitals:   10/21/17 0856  BP: (!) 170/79  Pulse: 63  Resp: 16  Temp: (!) 97.1 F (36.2 C)  TempSrc: Oral  SpO2: 99%  Weight: 177 lb (80.3 kg)  Height: 5\' 7"  (1.702 m)

## 2017-10-28 ENCOUNTER — Encounter: Payer: Self-pay | Admitting: Family Medicine

## 2017-11-11 ENCOUNTER — Encounter (HOSPITAL_COMMUNITY)
Admission: RE | Disposition: A | Discharge status: Home / Self Care | Payer: Self-pay | Source: Ambulatory Visit | Attending: Internal Medicine

## 2017-11-11 ENCOUNTER — Ambulatory Visit (HOSPITAL_COMMUNITY)
Admission: RE | Admit: 2017-11-11 | Discharge: 2017-11-11 | Disposition: A | Discharge status: Home / Self Care | Payer: BLUE CROSS/BLUE SHIELD | Source: Ambulatory Visit | Attending: Internal Medicine | Admitting: Internal Medicine

## 2017-11-11 ENCOUNTER — Other Ambulatory Visit: Payer: Self-pay

## 2017-11-11 ENCOUNTER — Encounter (HOSPITAL_COMMUNITY): Payer: Self-pay | Admitting: *Deleted

## 2017-11-11 DIAGNOSIS — R1319 Other dysphagia: Secondary | ICD-10-CM

## 2017-11-11 DIAGNOSIS — K3189 Other diseases of stomach and duodenum: Secondary | ICD-10-CM | POA: Insufficient documentation

## 2017-11-11 DIAGNOSIS — K2970 Gastritis, unspecified, without bleeding: Secondary | ICD-10-CM

## 2017-11-11 DIAGNOSIS — R7303 Prediabetes: Secondary | ICD-10-CM | POA: Insufficient documentation

## 2017-11-11 DIAGNOSIS — K219 Gastro-esophageal reflux disease without esophagitis: Secondary | ICD-10-CM | POA: Insufficient documentation

## 2017-11-11 DIAGNOSIS — Z800 Family history of malignant neoplasm of digestive organs: Secondary | ICD-10-CM | POA: Insufficient documentation

## 2017-11-11 DIAGNOSIS — I714 Abdominal aortic aneurysm, without rupture: Secondary | ICD-10-CM | POA: Insufficient documentation

## 2017-11-11 DIAGNOSIS — Z87891 Personal history of nicotine dependence: Secondary | ICD-10-CM | POA: Insufficient documentation

## 2017-11-11 DIAGNOSIS — Z7982 Long term (current) use of aspirin: Secondary | ICD-10-CM | POA: Diagnosis not present

## 2017-11-11 DIAGNOSIS — Z79899 Other long term (current) drug therapy: Secondary | ICD-10-CM | POA: Insufficient documentation

## 2017-11-11 DIAGNOSIS — E785 Hyperlipidemia, unspecified: Secondary | ICD-10-CM | POA: Insufficient documentation

## 2017-11-11 DIAGNOSIS — K222 Esophageal obstruction: Secondary | ICD-10-CM | POA: Insufficient documentation

## 2017-11-11 DIAGNOSIS — R1310 Dysphagia, unspecified: Secondary | ICD-10-CM | POA: Diagnosis not present

## 2017-11-11 DIAGNOSIS — Z7984 Long term (current) use of oral hypoglycemic drugs: Secondary | ICD-10-CM | POA: Diagnosis not present

## 2017-11-11 DIAGNOSIS — I10 Essential (primary) hypertension: Secondary | ICD-10-CM | POA: Insufficient documentation

## 2017-11-11 HISTORY — PX: ESOPHAGOGASTRODUODENOSCOPY: SHX5428

## 2017-11-11 HISTORY — PX: MALONEY DILATION: SHX5535

## 2017-11-11 LAB — GLUCOSE, CAPILLARY: Glucose-Capillary: 127 mg/dL — ABNORMAL HIGH (ref 70–99)

## 2017-11-11 SURGERY — EGD (ESOPHAGOGASTRODUODENOSCOPY)
Anesthesia: Moderate Sedation

## 2017-11-11 MED ORDER — SODIUM CHLORIDE 0.9 % IV SOLN
INTRAVENOUS | Status: DC
  Administered 2017-11-11: 08:00:00 via INTRAVENOUS

## 2017-11-11 MED ORDER — MIDAZOLAM HCL 5 MG/5ML IJ SOLN
INTRAMUSCULAR | Status: AC
  Filled 2017-11-11: qty 10

## 2017-11-11 MED ORDER — MEPERIDINE HCL 100 MG/ML IJ SOLN
INTRAMUSCULAR | Status: DC | PRN
  Administered 2017-11-11: 50 mg via INTRAVENOUS

## 2017-11-11 MED ORDER — ONDANSETRON HCL 4 MG/2ML IJ SOLN
INTRAMUSCULAR | Status: DC | PRN
  Administered 2017-11-11: 4 mg via INTRAVENOUS

## 2017-11-11 MED ORDER — STERILE WATER FOR IRRIGATION IR SOLN
Status: DC | PRN
  Administered 2017-11-11: 08:00:00

## 2017-11-11 MED ORDER — LIDOCAINE VISCOUS HCL 2 % MT SOLN
OROMUCOSAL | Status: AC
  Filled 2017-11-11: qty 15

## 2017-11-11 MED ORDER — MIDAZOLAM HCL 5 MG/5ML IJ SOLN
INTRAMUSCULAR | Status: DC | PRN
  Administered 2017-11-11 (×2): 1 mg via INTRAVENOUS
  Administered 2017-11-11: 2 mg via INTRAVENOUS

## 2017-11-11 MED ORDER — MEPERIDINE HCL 100 MG/ML IJ SOLN
INTRAMUSCULAR | Status: AC
  Filled 2017-11-11: qty 2

## 2017-11-11 MED ORDER — ONDANSETRON HCL 4 MG/2ML IJ SOLN
INTRAMUSCULAR | Status: AC
  Filled 2017-11-11: qty 2

## 2017-11-11 NOTE — Op Note (Signed)
[facility] Patient Name: [name] Procedure Date: 11/11/2017 8:05 AM MRN: [id number] Date of Birth: June 04, 1955 Attending MD: Norvel Richards , MD CSN: [id number] Age: 62 Admit Type: Outpatient Procedure:                Upper GI endoscopy Indications:              Dysphagia Providers:                Norvel Richards, MD, Jeanann Lewandowsky. Gwenlyn Perking RN, RN,                            Nelma Rothman, Technician Referring MD:              Medicines:                Midazolam 4 mg IV, Meperidine 50 mg IV, Ondansetron                            4 mg IV Complications:            No immediate complications. Estimated Blood Loss:     Estimated blood loss was minimal. Procedure:                Pre-Anesthesia Assessment:                           - Prior to the procedure, a History and Physical                            was performed, and patient medications and                            allergies were reviewed. The patient's tolerance of                            previous anesthesia was also reviewed. The risks                            and benefits of the procedure and the sedation                            options and risks were discussed with the patient.                            All questions were answered, and informed consent                            was obtained. Prior Anticoagulants: The patient has                            taken no previous anticoagulant or antiplatelet                            agents. ASA Grade Assessment: II - A patient with  mild systemic disease. After reviewing the risks                            and benefits, the patient was deemed in                            satisfactory condition to undergo the procedure.                           After obtaining informed consent, the endoscope was                            passed under direct vision. Throughout the                            procedure, the patient's blood  pressure, pulse, and                            oxygen saturations were monitored continuously. The                            EG-[id number] ([id number]) scope was introduced through the                            mouth, and advanced to the second part of duodenum.                            The upper GI endoscopy was accomplished without                            difficulty. The patient tolerated the procedure                            well. Scope In: 8:17:53 AM Scope Out: 8:25:21 AM Total Procedure Duration: 0 hours 7 minutes 28 seconds  Findings:      A mild Schatzki ring was found at the gastroesophageal junction. No       esophagitis or nodularity. No Barrett's epithelium seen.      A couple of linear antral erosions seen. No ulcer or infiltrating       process seen      The duodenal bulb and second portion of the duodenum were normal. The       scope was withdrawn. Dilation was performed with a Maloney dilator with       mild resistance at 62 Fr. The scope was withdrawn. Dilation was       performed with a Maloney dilator with mild resistance at 56 Fr. The       dilation site was examined following endoscope reinsertion and showed       moderate improvement in luminal narrowing. Estimated blood loss was       minimal. Impression:               - Mild Schatzki ring. Dilated.                           - Erosive gastropathy -  mile.                           - Normal duodenal bulb and second portion of the                            duodenum.                           - No specimens collected. Moderate Sedation:      Moderate (conscious) sedation was administered by the endoscopy nurse       and supervised by the endoscopist. The following parameters were       monitored: oxygen saturation, heart rate, blood pressure, respiratory       rate, EKG, adequacy of pulmonary ventilation, and response to care.       Total physician intraservice time was 13 minutes. Recommendation:           -  Patient has a contact number available for                            emergencies. The signs and symptoms of potential                            delayed complications were discussed with the                            patient. Return to normal activities tomorrow.                            Written discharge instructions were provided to the                            patient.                           - Resume previous diet.                           - Continue present medications. Procedure Code(s):        --- Professional ---                           [phone], Esophagogastroduodenoscopy, flexible,                            transoral; diagnostic, including collection of                            specimen(s) by brushing or washing, when performed                            (separate procedure)                           99357, Dilation of esophagus, by unguided sound or  bougie, single or multiple passes                           G0500, Moderate sedation services provided by the                            same physician or other qualified health care                            professional performing a gastrointestinal                            endoscopic service that sedation supports,                            requiring the presence of an independent trained                            observer to assist in the monitoring of the                            patient's level of consciousness and physiological                            status; initial 15 minutes of intra-service time;                            patient age 45 years or older (additional time may                            be reported with [phone], as appropriate) Diagnosis Code(s):        --- Professional ---                           K22.2, Esophageal obstruction                           K31.89, Other diseases of stomach and duodenum                           R13.10, Dysphagia, unspecified CPT  copyright 2017 American Medical Association. All rights reserved. The codes documented in this report are preliminary and upon coder review may  be revised to meet current compliance requirements. Cristopher Estimable. [name], MD Norvel Richards, MD 11/11/2017 8:35:17 AM This report has been signed electronically. Number of Addenda: 0

## 2017-11-11 NOTE — Discharge Instructions (Signed)
EGD Discharge instructions Please read the instructions outlined below and refer to this sheet in the next few weeks. These discharge instructions provide you with general information on caring for yourself after you leave the hospital. Your doctor may also give you specific instructions. While your treatment has been planned according to the most current medical practices available, unavoidable complications occasionally occur. If you have any problems or questions after discharge, please call your doctor. ACTIVITY  You may resume your regular activity but move at a slower pace for the next 24 hours.   Take frequent rest periods for the next 24 hours.   Walking will help expel (get rid of) the air and reduce the bloated feeling in your abdomen.   No driving for 24 hours (because of the anesthesia (medicine) used during the test).   You may shower.   Do not sign any important legal documents or operate any machinery for 24 hours (because of the anesthesia used during the test).  NUTRITION  Drink plenty of fluids.   You may resume your normal diet.   Begin with a light meal and progress to your normal diet.   Avoid alcoholic beverages for 24 hours or as instructed by your caregiver.  MEDICATIONS  You may resume your normal medications unless your caregiver tells you otherwise.  WHAT YOU CAN EXPECT TODAY  You may experience abdominal discomfort such as a feeling of fullness or gas pains.  FOLLOW-UP  Your doctor will discuss the results of your test with you.  SEEK IMMEDIATE MEDICAL ATTENTION IF ANY OF THE FOLLOWING OCCUR:  Excessive nausea (feeling sick to your stomach) and/or vomiting.   Severe abdominal pain and distention (swelling).   Trouble swallowing.   Temperature over 101 F (37.8 C).   Rectal bleeding or vomiting of blood.   GERD information provided  Continue esomeprazole 20 mg daily.  It is important to take this medication everyday where you have symptoms  or not.  Office visit with [place] in one year   Gastroesophageal Reflux Disease, Adult Normally, food travels down the esophagus and stays in the stomach to be digested. However, when a person has gastroesophageal reflux disease (GERD), food and stomach acid move back up into the esophagus. When this happens, the esophagus becomes sore and inflamed. Over time, GERD can create small holes (ulcers) in the lining of the esophagus. What are the causes? This condition is caused by a problem with the muscle between the esophagus and the stomach (lower esophageal sphincter, or LES). Normally, the LES muscle closes after food passes through the esophagus to the stomach. When the LES is weakened or abnormal, it does not close properly, and that allows food and stomach acid to go back up into the esophagus. The LES can be weakened by certain dietary substances, medicines, and medical conditions, including:  Tobacco use.  Pregnancy.  Having a hiatal hernia.  Heavy alcohol use.  Certain foods and beverages, such as coffee, chocolate, onions, and peppermint.  What increases the risk? This condition is more likely to develop in:  People who have an increased body weight.  People who have connective tissue disorders.  People who use NSAID medicines.  What are the signs or symptoms? Symptoms of this condition include:  Heartburn.  Difficult or painful swallowing.  The feeling of having a lump in the throat.  Abitter taste in the mouth.  Bad breath.  Having a large amount of saliva.  Having an upset or bloated stomach.  Belching.  Chest pain.  Shortness of breath or wheezing.  Ongoing (chronic) cough or a night-time cough.  Wearing away of tooth enamel.  Weight loss.  Different conditions can cause chest pain. Make sure to see your health care provider if you experience chest pain. How is this diagnosed? Your health care provider will take a medical history and perform a  physical exam. To determine if you have mild or severe GERD, your health care provider may also monitor how you respond to treatment. You may also have other tests, including:  An endoscopy toexamine your stomach and esophagus with a small camera.  A test thatmeasures the acidity level in your esophagus.  A test thatmeasures how much pressure is on your esophagus.  A barium swallow or modified barium swallow to show the shape, size, and functioning of your esophagus.  How is this treated? The goal of treatment is to help relieve your symptoms and to prevent complications. Treatment for this condition may vary depending on how severe your symptoms are. Your health care provider may recommend:  Changes to your diet.  Medicine.  Surgery.  Follow these instructions at home: Diet  Follow a diet as recommended by your health care provider. This may involve avoiding foods and drinks such as: ? Coffee and tea (with or without caffeine). ? Drinks that containalcohol. ? Energy drinks and sports drinks. ? Carbonated drinks or sodas. ? Chocolate and cocoa. ? Peppermint and mint flavorings. ? Garlic and onions. ? Horseradish. ? Spicy and acidic foods, including peppers, chili powder, curry powder, vinegar, hot sauces, and barbecue sauce. ? Citrus fruit juices and citrus fruits, such as oranges, lemons, and limes. ? Tomato-based foods, such as red sauce, chili, salsa, and pizza with red sauce. ? Fried and fatty foods, such as donuts, french fries, potato chips, and high-fat dressings. ? High-fat meats, such as hot dogs and fatty cuts of red and white meats, such as rib eye steak, sausage, ham, and bacon. ? High-fat dairy items, such as whole milk, butter, and cream cheese.  Eat small, frequent meals instead of large meals.  Avoid drinking large amounts of liquid with your meals.  Avoid eating meals during the 2-3 hours before bedtime.  Avoid lying down right after you eat.  Do not  exercise right after you eat. General instructions  Pay attention to any changes in your symptoms.  Take over-the-counter and prescription medicines only as told by your health care provider. Do not take aspirin, ibuprofen, or other NSAIDs unless your health care provider told you to do so.  Do not use any tobacco products, including cigarettes, chewing tobacco, and e-cigarettes. If you need help quitting, ask your health care provider.  Wear loose-fitting clothing. Do not wear anything tight around your waist that causes pressure on your abdomen.  Raise (elevate) the head of your bed 6 inches (15cm).  Try to reduce your stress, such as with yoga or meditation. If you need help reducing stress, ask your health care provider.  If you are overweight, reduce your weight to an amount that is healthy for you. Ask your health care provider for guidance about a safe weight loss goal.  Keep all follow-up visits as told by your health care provider. This is important. Contact a health care provider if:  You have new symptoms.  You have unexplained weight loss.  You have difficulty swallowing, or it hurts to swallow.  You have wheezing or a persistent cough.  Your symptoms do not improve with  treatment.  You have a hoarse voice. Get help right away if:  You have pain in your arms, neck, jaw, teeth, or back.  You feel sweaty, dizzy, or light-headed.  You have chest pain or shortness of breath.  You vomit and your vomit looks like blood or coffee grounds.  You faint.  Your stool is bloody or black.  You cannot swallow, drink, or eat. This information is not intended to replace advice given to you by your health care provider. Make sure you discuss any questions you have with your health care provider. Document Released: 02/03/2005 Document Revised: 09/24/2015 Document Reviewed: 08/21/2014 Elsevier Interactive Patient Education  [employer].

## 2017-11-11 NOTE — H&P (Signed)
_0 @   Primary Care Physician:  Caren Macadam, MD Primary Gastroenterologist:  Dr. Gala Romney  Pre-Procedure History & Physical: HPI:  [name] is a 62 y.o. female here for further evaluation of the dysphagia. History of Schatzki's ring dilated previously.  Past Medical History:  Diagnosis Date  . Abdominal aortic aneurysm (AAA) 3.0 cm to 5.0 cm in diameter in female [facility])    None seen on prior imaging?   . Hiatal hernia    small  . Hyperlipidemia   . Hypertension   . Pre-diabetes   . Schatzki's ring   . Tremor     Past Surgical History:  Procedure Laterality Date  . CHOLECYSTECTOMY    . COLONOSCOPY    06/16/2004   XTG:GYIRSW rectum/Diminutive polyps of the splenic flexure and the cecum/The remainder of the colonic mucosa appeared normal pathology (hyperplastic polyps).  . COLONOSCOPY N/A 08/07/2015   Dr. Gala Romney: 6 mm descending colon tubular adenoma, multiple medium-mouthed diverticula in sigmoid colon. surveillance 2022  . ESOPHAGOGASTRODUODENOSCOPY  09/11/2001   NIO:[id number] ring otherwise normal esophagus/Normal stomach/Normal D1 and D2 status post passage of a 56 [place] Maloney dilator  . ESOPHAGOGASTRODUODENOSCOPY (EGD) WITH ESOPHAGEAL DILATION N/A 01/25/2013   Dr. Gala Romney- schatzki's ring- 21 F dilation. small hiatal hernia  . TUBAL LIGATION      Prior to Admission medications   Medication Sig Start Date End Date Taking? Authorizing Provider  acetaminophen (TYLENOL) 500 MG tablet Take 1,000 mg by mouth every 6 (six) hours as needed for pain.   Yes [provider]  ALPRAZolam Duanne Moron) 1 MG tablet Take 1 mg by mouth daily as needed for anxiety.  12/27/12  Yes [provider]  amLODipine-valsartan (EXFORGE) 10-320 MG tablet TAKE 1 TABLET BY MOUTH DAILY 09/26/17  Yes Caren Macadam, MD  aspirin EC 81 MG tablet Take 81 mg by mouth every morning.    Yes [provider]  atorvastatin (LIPITOR) 40 MG tablet Take 1 tablet (40 mg total) by mouth  daily. 04/25/17  Yes Hagler, Apolonio Schneiders, MD  cetirizine (ZYRTEC) 10 MG tablet Take 10 mg by mouth at bedtime.    Yes [provider]  esomeprazole (NEXIUM) 20 MG capsule Take 20 mg by mouth daily.    Yes [provider]  fluticasone (FLONASE) 50 MCG/ACT nasal spray Place 2 sprays into both nostrils daily. 07/26/17  Yes Hagler, Apolonio Schneiders, MD  hydrochlorothiazide (HYDRODIURIL) 25 MG tablet Take 1 tablet (25 mg total) daily by mouth. 03/14/17  Yes Caren Macadam, MD  linaclotide Rolan Lipa) 72 MCG capsule Take 1 capsule (72 mcg total) by mouth daily before breakfast. Patient taking differently: Take 72 mcg by mouth daily as needed (constipation).  07/26/17  Yes Hagler, Apolonio Schneiders, MD  metFORMIN (GLUCOPHAGE) 500 MG tablet TAKE 1 TABLET(500 MG) BY MOUTH TWICE DAILY WITH A MEAL Patient taking differently: TAKE 1 TABLET(500 MG) BY MOUTH ONCE DAILY WITH A MEAL 09/26/17  Yes Hagler, Apolonio Schneiders, MD  metoprolol succinate (TOPROL-XL) 100 MG 24 hr tablet Take 1 tablet (100 mg total) by mouth daily. Take with or immediately following a meal. 04/25/17  Yes Hagler, Apolonio Schneiders, MD  Omega-3 Fatty Acids (FISH OIL) 1200 MG CAPS Take 1,200 mg by mouth daily.   Yes [provider]  potassium chloride SA (K-DUR,KLOR-CON) 20 MEQ tablet Take 20 mEq by mouth daily.   Yes [provider]  vitamin B-12 (CYANOCOBALAMIN) 1000 MCG tablet Take 1,000 mcg by mouth daily.   Yes [provider]  blood glucose meter kit and supplies  KIT E11.9 Test once daily Patient not taking: Reported on 10/21/2017 04/28/17   Caren Macadam, MD    Allergies as of 09/30/2017 - Review Complete 09/30/2017  Allergen Reaction Noted  . Ciprofloxacin Shortness Of Breath 12/27/2011    Family History  Problem Relation Age of Onset  . Diabetes Other   . Diabetes Mother   . Heart disease Mother   . Heart disease Father   . Diabetes Sister   . Diabetes Brother   . Hypertension Brother   . Diabetes Daughter   . Hypertension  Maternal Grandmother   . Stroke Maternal Grandfather   . Early death Paternal Grandfather   . Colon cancer Neg Hx   . Liver disease Neg Hx     Social History   Socioeconomic History  . Marital status: Single    Spouse name: Not on file  . Number of children: 3  . Years of education: Not on file  . Highest education level: Not on file  Occupational History  . 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 Financial resource strain: Not on file  . Food insecurity:    Worry: Not on file    Inability: Not on file  . Transportation needs:    Medical: Not on file    Non-medical: Not on file  Tobacco Use  . Smoking status: Former Smoker    Packs/day: 0.25    Years: 35.00    Pack years: 8.75    Types: Cigarettes    Last attempt to quit: 12/31/2012    Years since quitting: 4.8  . Smokeless tobacco: Never Used  Substance and Sexual Activity  . Alcohol use: Yes    Comment: occasionaly  . Drug use: No  . Sexual activity: Yes    Partners: Male    Birth control/protection: Post-menopausal  Lifestyle  . Physical activity:    Days per week: Not on file    Minutes per session: Not on file  . Stress: Not on file  Relationships  . Social connections:    Talks on phone: Not on file    Gets together: Not on file    Attends religious service: Not on file    Active member of club or organization: Not on file    Attends meetings of clubs or organizations: Not on file    Relationship status: Not on file  . Intimate partner violence:    Fear of current or ex partner: Not on file    Emotionally abused: Not on file    Physically abused: Not on file    Forced sexual activity: Not on file  Other Topics Concern  . Not on file  Social History Narrative   Eats all food groups.    Wears seatbelt.    Has 3 children.   Prior smoker.   Attends church.    Used to work in [employer] fund[employer].    Drives.   Enjoys going out with family.     Review of Systems: See HPI,  otherwise negative ROS  Physical Exam: BP (!) 155/71   Pulse 65   Temp 97.8 F (36.6 C) (Oral)   Resp 14   Ht _0  (1.702 m)   Wt 177 lb (80.3 kg)   SpO2 98%   BMI 27.72 kg/m  General:   Alert,  Well-developed, well-nourished, pleasant and cooperative in NAD Lungs:  Clear throughout to auscultation.   No wheezes, crackles, or rhonchi. No acute distress. Heart:  Regular  rate and rhythm; no murmurs, clicks, rubs,  or gallops. Abdomen: Non-distended, normal bowel sounds.  Soft and nontender without appreciable mass or hepatosplenomegaly.  Pulses:  Normal pulses noted. Extremities:  Without clubbing or edema.  Impression/Plan:  Impression 62 year old with GERD adilated previously.  plan EGD with ED..  The risks, benefits, limitations, alternatives and imponderables have been reviewed with the patient. Potential for esophageal dilation, biopsy, etc. have also been reviewed.  Questions have been answered. All parties agreeable.     Notice: This dictation was prepared with Dragon dictation along with smaller phrase technology. Any transcriptional errors that result from this process are unintentional and may not be corrected upon review.

## 2017-11-15 ENCOUNTER — Encounter: Payer: Self-pay | Admitting: Family Medicine

## 2017-11-15 ENCOUNTER — Encounter: Payer: Self-pay | Admitting: Neurology

## 2017-11-15 ENCOUNTER — Ambulatory Visit: Payer: BLUE CROSS/BLUE SHIELD | Admitting: Neurology

## 2017-11-15 VITALS — BP 165/74 | HR 64 | Ht 67.0 in | Wt 180.0 lb

## 2017-11-15 DIAGNOSIS — G4733 Obstructive sleep apnea (adult) (pediatric): Secondary | ICD-10-CM

## 2017-11-15 NOTE — Patient Instructions (Signed)
Thank you for choosing [facility] for your sleep related care! It was nice to meet you today! I appreciate that you entrust me with your sleep related healthcare concerns. I hope, I was able to address at least some of your concerns today, and that I can help you feel reassured and also get better.    Here is what we discussed today and what we came up with as our plan for you:    Based on your symptoms and your exam I believe you are still at risk for obstructive sleep apnea and would benefit from reevaluation as it has been some years and you need new supplies and a new machine. Therefore, I think we should proceed with a sleep study to determine how severe your sleep apnea is. If you have more than mild OSA, I want you to consider ongoing treatment with CPAP. Please remember, the risks and ramifications of moderate to severe obstructive sleep apnea or OSA are: Cardiovascular disease, including congestive heart failure, stroke, difficult to control hypertension, arrhythmias, and even type 2 diabetes has been linked to untreated OSA. Sleep apnea causes disruption of sleep and sleep deprivation in most cases, which, in turn, can cause recurrent headaches, problems with memory, mood, concentration, focus, and vigilance. Most people with untreated sleep apnea report excessive daytime sleepiness, which can affect their ability to drive. Please do not drive if you feel sleepy.   I will likely see you back after your sleep study to go over the test results and where to go from there. We will call you after your sleep study to advise about the results (most likely, you will hear from [place], my nurse) and to set up an appointment at the time, as necessary.    Our sleep lab administrative assistant will call you to schedule your sleep study. If you don't hear back from her by about 2 weeks from now, please feel free to call her at [phone]. You can leave a message with your phone number  and concerns, if you get the voicemail box. She will call back as soon as possible.

## 2017-11-15 NOTE — Progress Notes (Signed)
Subjective:    Patient ID: [name] is a 62 y.o. female.  HPI     History:   Dear Dr. Mannie Stabile,   I saw your patient, [name], upon your kind request in my neurologic clinic today for consultation of her sleep disturbance, reevaluation for sleep apnea in particular. The patient is unaccompanied today. As you know, [name] is a 62 year old right-handed woman with an underlying medical history of hyperlipidemia, hypertension, hiatal hernia, tremor, AAA and mildly overweight state, who reports issues with her sleep including snoring and nonrestorative sleep. I have previously seen her about 4 years ago for sleep-related issues and she had a sleep study in April 2015 which showed overall borderline sleep apnea with an AHI of 5.5 per hour, supine AHI of 12 per hour, REM AHI of 17.1 per hour and O2 nadir of 84%. Her BMI was 28.7 at the time.  She is retired and lives with her brother. She has 3 children. She quit smoking in 2014 and drinks alcohol occasionally. Her Epworth sleepiness score is 7 out of 24, fatigue score is 11 out of 63. She thinks the tremor is better since she has been on metoprolol. She goes to bed around 10, does take her a while to fall asleep, typically she watches TV in bed. She puts it on a timer. She has nocturia about once per average night. She has difficulty maintaining sleep. She retired last year. She gets out of bed around 6 or 7 but often she will wake up around 3 or 4 AM and does not always go back to sleep. Her brother has sleep apnea and has a CPAP machine. Her BP has been trending higher. Previously: 08/02/2013: 62 year old right-handed woman with an underlying medical history of hypertension, hyperlipidemia, reflux disease, as well as a diagnosis of possible Parkinson's disease who was diagnosed with obstructive sleep apnea several years ago but was not able to tolerate CPAP at the time, because of recurrent sinus problems. She presents for  reevaluation and potential treatment of this. She previously used to see Dr. Merlene Laughter for her sleep disorder and R hand tremor. According to Dr. Freddie Apley notes, her RDI was 10/h, O2 nadir was 87% and her CPAP pressure was 10 cm. She has not used it for over 5 years, but still has the machine, which she forgot to bring today. She does endorse sleeping better with CPAP and her BP values were better when she was on treatment. I reviewed her baseling sleep study results from 08/15/03, which showed an RDI of 10.8/h, mean O2 of 93.7% and nadir of 87.2%. She had an increase in light stage sleep, absence of deep sleep and reduced REM sleep with delay in REM. She then had a CPAP study on 09/11/03, which I reviewed also: She was titrated to 10 cm. She had no significant PLMs in either test. She weight about 10 lb more then.  She still has recurrent sinus issues, and in fact, these did not necessarily get better after she stopped the CPAP. She has had issues with rising BP values from time to time.  She started noticing a R hand tremor some 12 years ago or even in her late 105s, progressive and now is noticeible in both hands, worse with writing, action, not at rest. She has no FHx of PD or ET, but had one MU with tremors, but in the context of alcoholism. She has not been on any symptomatic treatment, but was given a Rx for  a PD medicine, but did not take it; she was given samples. She works from 7 A to [employer], M-Th and occasionally on Fri and Sat from 7 to 3 PM. She works at [employer].    Her typical bedtime is reported to be around 9 PM and usual wake time is around 4:45 AM. Sleep onset typically occurs within 15 minutes. She reports feeling adequately rested upon awakening. She wakes up on an average 2 times in the middle of the night and has to go to the bathroom 1 times on a typical night. She reports occasional morning headaches.  She reports excessive daytime somnolence (EDS) and Her Epworth Sleepiness Score (ESS) is  6/24 today. She has not fallen asleep while driving. The patient has not been taking a planned nap.  She has been known to snore for the past many years. Snoring is reportedly moderate, and associated with choking sounds and witnessed apneas. The patient admits to a sense of choking or strangling feeling. There is report of nighttime reflux, with no nighttime cough experienced. The patient has not noted any RLS symptoms and is not known to kick while asleep or before falling asleep. There is no family history of RLS, but 2 cousins have OSA, on CPAP.  She is a restless sleeper and in the morning, the bed is quite disheveled.   She denies cataplexy, sleep paralysis, hypnagogic or hypnopompic hallucinations, or sleep attacks. She does not report any vivid dreams, nightmares, dream enactments, or parasomnias, such as sleep talking or sleep walking.  She consumes 1 caffeinated beverages per day, usually in the form of coffee or soda.   There is a TV in the bedroom and usually it is on at night.  Her Past Medical History Is Significant For: Past Medical History:  Diagnosis Date  . Abdominal aortic aneurysm (AAA) 3.0 cm to 5.0 cm in diameter in female [facility])    None seen on prior imaging?   . Hiatal hernia    small  . Hyperlipidemia   . Hypertension   . Pre-diabetes   . Schatzki's ring   . Tremor     Her Past Surgical History Is Significant For: Past Surgical History:  Procedure Laterality Date  . CHOLECYSTECTOMY    . COLONOSCOPY    06/16/2004   DXA:JOINOM rectum/Diminutive polyps of the splenic flexure and the cecum/The remainder of the colonic mucosa appeared normal pathology (hyperplastic polyps).  . COLONOSCOPY N/A 08/07/2015   Dr. Gala Romney: 6 mm descending colon tubular adenoma, multiple medium-mouthed diverticula in sigmoid colon. surveillance 2022  . ESOPHAGOGASTRODUODENOSCOPY  09/11/2001   VEH:[id number] ring otherwise normal esophagus/Normal stomach/Normal D1 and D2 status post passage of a  56 [place] Maloney dilator  . ESOPHAGOGASTRODUODENOSCOPY (EGD) WITH ESOPHAGEAL DILATION N/A 01/25/2013   Dr. Gala Romney- schatzki's ring- 70 F dilation. small hiatal hernia  . TUBAL LIGATION      Her Family History Is Significant For: Family History  Problem Relation Age of Onset  . Diabetes Other   . Diabetes Mother   . Heart disease Mother   . Heart disease Father   . Diabetes Sister   . Diabetes Brother   . Hypertension Brother   . Diabetes Daughter   . Hypertension Maternal Grandmother   . Stroke Maternal Grandfather   . Early death Paternal Grandfather   . Colon cancer Neg Hx   . Liver disease Neg Hx     Her Social History Is Significant For: Social History   Socioeconomic History  .  Marital status: Single    Spouse name: Not on file  . Number of children: 3  . Years of education: Not on file  . Highest education level: Not on file  Occupational History  . 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 Financial resource strain: Not on file  . Food insecurity:    Worry: Not on file    Inability: Not on file  . Transportation needs:    Medical: Not on file    Non-medical: Not on file  Tobacco Use  . Smoking status: Former Smoker    Packs/day: 0.25    Years: 35.00    Pack years: 8.75    Types: Cigarettes    Last attempt to quit: 12/31/2012    Years since quitting: 4.8  . Smokeless tobacco: Never Used  Substance and Sexual Activity  . Alcohol use: Yes    Comment: occasionaly  . Drug use: No  . Sexual activity: Yes    Partners: Male    Birth control/protection: Post-menopausal  Lifestyle  . Physical activity:    Days per week: Not on file    Minutes per session: Not on file  . Stress: Not on file  Relationships  . Social connections:    Talks on phone: Not on file    Gets together: Not on file    Attends religious service: Not on file    Active member of club or organization: Not on file    Attends meetings of clubs or  organizations: Not on file    Relationship status: Not on file  Other Topics Concern  . Not on file  Social History Narrative   Eats all food groups.    Wears seatbelt.    Has 3 children.   Prior smoker.   Attends church.    Used to work in [employer].    Drives.   Enjoys going out with family.     Her Allergies Are:  Allergies  Allergen Reactions  . Ciprofloxacin Shortness Of Breath  :   Her Current Medications Are:  Outpatient Encounter Medications as of 11/15/2017  Medication Sig  . acetaminophen (TYLENOL) 500 MG tablet Take 1,000 mg by mouth every 6 (six) hours as needed for pain.  Marland Kitchen ALPRAZolam (XANAX) 1 MG tablet Take 1 mg by mouth daily as needed for anxiety.   Marland Kitchen amLODipine-valsartan (EXFORGE) 10-320 MG tablet TAKE 1 TABLET BY MOUTH DAILY  . aspirin EC 81 MG tablet Take 81 mg by mouth every morning.   Marland Kitchen atorvastatin (LIPITOR) 40 MG tablet Take 1 tablet (40 mg total) by mouth daily.  . blood glucose meter kit and supplies KIT E11.9 Test once daily  . cetirizine (ZYRTEC) 10 MG tablet Take 10 mg by mouth at bedtime.   Marland Kitchen esomeprazole (NEXIUM) 20 MG capsule Take 20 mg by mouth daily.   . fluticasone (FLONASE) 50 MCG/ACT nasal spray Place 2 sprays into both nostrils daily.  . hydrochlorothiazide (HYDRODIURIL) 25 MG tablet Take 1 tablet (25 mg total) daily by mouth.  . linaclotide (LINZESS) 72 MCG capsule Take 1 capsule (72 mcg total) by mouth daily before breakfast. (Patient taking differently: Take 72 mcg by mouth daily as needed (constipation). )  . metFORMIN (GLUCOPHAGE) 500 MG tablet TAKE 1 TABLET(500 MG) BY MOUTH TWICE DAILY WITH A MEAL (Patient taking differently: TAKE 1 TABLET(500 MG) BY MOUTH ONCE DAILY WITH A MEAL)  . metoprolol succinate (TOPROL-XL) 100 MG 24 hr tablet Take 1 tablet (100  mg total) by mouth daily. Take with or immediately following a meal.  . Omega-3 Fatty Acids (FISH OIL) 1200 MG CAPS Take 1,200 mg by mouth daily.  . potassium chloride SA (K-DUR,KLOR-CON)  20 MEQ tablet Take 20 mEq by mouth daily.  . vitamin B-12 (CYANOCOBALAMIN) 1000 MCG tablet Take 1,000 mcg by mouth daily.   No facility-administered encounter medications on file as of 11/15/2017.   :  Review of Systems:  Out of a complete 14 point review of systems, all are reviewed and negative with the exception of these symptoms as listed below:  Review of Systems  Neurological:       Pt presents today to discuss her sleep. Pt does not have a cpap any longer. She is having trouble sleeping. Her last sleep study was in 2015.  Epworth Sleepiness Scale 0= would never doze 1= slight chance of dozing 2= moderate chance of dozing 3= high chance of dozing  Sitting and reading: 1 Watching TV: 1 Sitting inactive in a public place (ex. Theater or meeting): 0 As a passenger in a car for an hour without a break: 3 Lying down to rest in the afternoon: 2 Sitting and talking to someone: 0 Sitting quietly after lunch (no alcohol): 0 In a car, while stopped in traffic: 0 Total: 7     Objective:  Neurological Exam  Physical Exam Physical Examination:   Vitals:   11/15/17 1056  BP: (!) 165/74  Pulse: 64    General Examination: The patient is a very pleasant 62 y.o. female in no acute distress. She appears well-developed and well-nourished and well groomed.   HEENT: Normocephalic, atraumatic, pupils are equal, round and reactive to light and accommodation. Corrective glasses in place. Extraocular tracking is good without limitation to gaze excursion or nystagmus noted. Normal smooth pursuit is noted. Hearing is grossly intact. Face is symmetric with normal facial animation and normal facial sensation. Speech is clear with no dysarthria noted. There is no hypophonia. There is no lip, neck/head, jaw or voice tremor. Neck is supple with full range of passive and active motion. There are no carotid bruits on auscultation. Oropharynx exam reveals: mild mouth dryness, adequate dental hygiene  with full dentures and mild to moderate airway crowding, due to larger tongue, tonsils in place and narrow airway entry. Mallampati is class II. Tongue protrudes centrally and palate elevates symmetrically. Tonsils are 1+ to 2+ in size. Neck size is 14 3/4 inches. She has a small overbite.   Chest: Clear to auscultation without wheezing, rhonchi or crackles noted.  Heart: S1+S2+0, regular and normal without murmurs, rubs or gallops noted.   Abdomen: Soft, non-tender and non-distended with normal bowel sounds appreciated on auscultation.  Extremities: There is no pitting edema in the distal lower extremities bilaterally.   Skin: Warm and dry without trophic changes noted. There are no varicose veins.  Musculoskeletal: exam reveals no obvious joint deformities, tenderness or joint swelling or erythema.   Neurologically:  Mental status: The patient is awake, alert and oriented in all 4 spheres. Her immediate and remote memory, attention, language skills and fund of knowledge are appropriate. There is no evidence of aphasia, agnosia, apraxia or anomia. Speech is clear with normal prosody and enunciation. Thought process is linear. Mood is normal and affect is normal.  Cranial nerves II - XII are as described above under HEENT exam. In addition: shoulder shrug is normal with equal shoulder height noted. Motor exam: Normal bulk, strength and tone is noted. There  is no drift, or rebound. There is no resting tremor. There is a bilateral upper extremity postural and action tremor, which is mild in degree on the left and mild to moderate on the right. There tremor frequency is fairly fast and the amplitude is small.   Romberg is negative. Reflexes are 1-2+ throughout. Fine motor skills and coordination: intact with normal finger taps, normal hand movements, normal rapid alternating patting, normal foot taps and normal foot agility. Cerebellar testing: No dysmetria or intention tremor on finger to nose  testing. Heel to shin is unremarkable bilaterally. There is no truncal or gait ataxia.  Sensory exam: intact to light touch.  Gait, station and balance: She stands easily. No veering to one side is noted. No leaning to one side is noted. Posture is age-appropriate and stance is narrow based. Gait shows normal stride length and normal pace. No problems turning are noted.   Assessment and Plan:    In summary, [name] is a very pleasant 62 year old female with an underlying medical history of hyperlipidemia, hypertension, hiatal hernia, tremor, AAA and mildly overweight state, who presents for sleep evaluation. She does carry a prior diagnosis of sleep apnea and had a CPAP machine but no longer uses it for the past several years. Her sleep study from about 4 years ago showed borderline sleep apnea. She does report a family history of sleep apnea in her brother is well. She would be willing to get retested and consider CPAP therapy or AutoPap therapy again. To that end, I will order a sleep study and we will take it from there. We talked about the importance of maintaining a healthy lifestyle, weight management and healthy sleep habits.  I explained the risks and ramifications of untreated moderate to severe OSA, especially with respect to developing cardiovascular disease down the Road, including congestive heart failure, difficult to treat hypertension, cardiac arrhythmias, or stroke. Even type 2 diabetes has, in part, been linked to untreated OSA. Symptoms of untreated OSA include daytime sleepiness, memory problems, mood irritability and mood disorder such as depression and anxiety, lack of energy, as well as recurrent headaches, especially morning headaches.   I answered all her questions today and the patient was in agreement.   Thank you very much for allowing me to participate in the care of this nice patient. If I can be of any further assistance to you please do not hesitate to call me at  [phone].  Sincerely,   Star Age, MD, PhD

## 2017-11-16 ENCOUNTER — Encounter (HOSPITAL_COMMUNITY): Payer: Self-pay | Admitting: Internal Medicine

## 2017-11-23 ENCOUNTER — Telehealth: Payer: Self-pay

## 2017-11-23 DIAGNOSIS — G4733 Obstructive sleep apnea (adult) (pediatric): Secondary | ICD-10-CM

## 2017-11-23 NOTE — Addendum Note (Signed)
Addended by: Lester Michie A on: 11/23/2017 10:50 AM   Modules accepted: Orders

## 2017-11-23 NOTE — Telephone Encounter (Signed)
Insurance has denied in lab sleep study request. Please order HST, thanks!

## 2017-11-23 NOTE — Telephone Encounter (Signed)
VO for HST from Dr. Athar received. HST order placed.  

## 2017-11-25 ENCOUNTER — Ambulatory Visit: Payer: BLUE CROSS/BLUE SHIELD | Admitting: Family Medicine

## 2017-11-30 ENCOUNTER — Encounter: Payer: BLUE CROSS/BLUE SHIELD | Admitting: Neurology

## 2017-12-01 DIAGNOSIS — T1490XA Injury, unspecified, initial encounter: Secondary | ICD-10-CM | POA: Diagnosis not present

## 2017-12-01 DIAGNOSIS — S01402A Unspecified open wound of left cheek and temporomandibular area, initial encounter: Secondary | ICD-10-CM | POA: Diagnosis not present

## 2017-12-02 ENCOUNTER — Telehealth: Payer: Self-pay

## 2017-12-02 DIAGNOSIS — G4733 Obstructive sleep apnea (adult) (pediatric): Secondary | ICD-10-CM

## 2017-12-02 DIAGNOSIS — G479 Sleep disorder, unspecified: Secondary | ICD-10-CM

## 2017-12-02 DIAGNOSIS — E663 Overweight: Secondary | ICD-10-CM

## 2017-12-02 NOTE — Telephone Encounter (Signed)
Patient attempted two home sleep studies that did not work. One the finger probe came off> sent her home with a watchpat and the unit had an error code that stated probe was bad. I got an authorization for in lab sleep study due to two failed home studies.Patient was agreeable to come in the lab for her sleep study.

## 2017-12-05 DIAGNOSIS — L0202 Furuncle of face: Secondary | ICD-10-CM | POA: Diagnosis not present

## 2017-12-05 DIAGNOSIS — B9689 Other specified bacterial agents as the cause of diseases classified elsewhere: Secondary | ICD-10-CM | POA: Diagnosis not present

## 2017-12-05 NOTE — Telephone Encounter (Signed)
Split night sleep study ordered.  

## 2017-12-09 ENCOUNTER — Encounter: Payer: Self-pay | Admitting: Family Medicine

## 2017-12-09 ENCOUNTER — Ambulatory Visit (INDEPENDENT_AMBULATORY_CARE_PROVIDER_SITE_OTHER): Payer: BLUE CROSS/BLUE SHIELD | Admitting: Family Medicine

## 2017-12-09 VITALS — BP 162/78 | HR 60 | Temp 97.9°F | Resp 14 | Ht 67.0 in | Wt 176.0 lb

## 2017-12-09 DIAGNOSIS — Z7689 Persons encountering health services in other specified circumstances: Secondary | ICD-10-CM

## 2017-12-09 DIAGNOSIS — E118 Type 2 diabetes mellitus with unspecified complications: Secondary | ICD-10-CM | POA: Diagnosis not present

## 2017-12-09 DIAGNOSIS — I6523 Occlusion and stenosis of bilateral carotid arteries: Secondary | ICD-10-CM

## 2017-12-09 DIAGNOSIS — I10 Essential (primary) hypertension: Secondary | ICD-10-CM

## 2017-12-09 DIAGNOSIS — E785 Hyperlipidemia, unspecified: Secondary | ICD-10-CM | POA: Diagnosis not present

## 2017-12-09 DIAGNOSIS — I714 Abdominal aortic aneurysm, without rupture, unspecified: Secondary | ICD-10-CM

## 2017-12-09 MED ORDER — PREDNISONE 20 MG PO TABS: ORAL_TABLET | ORAL | 0 refills | Status: DC

## 2017-12-09 NOTE — Progress Notes (Signed)
Subjective:    Patient ID: [name], female    DOB: 18-Jan-1956, 62 y.o.   MRN: [id number]  HPI Patient is a very pleasant 62 year old African-American female here today to establish care.  Past medical history significant for type 2 diabetes mellitus currently well controlled on metformin 500 mg p.o. daily.  Most recent hemoglobin A1c was 3 months ago and was adequately controlled at 6.1.  She recently had a diabetic eye exam performed in her diabetic foot exam was performed today and is normal.  Past medical history is also significant for hypertension along with a history of whitecoat hypertension.  This is well documented in her chart which I have reviewed.  Her blood pressures at home tend to average between 120 and 140/60-80.  She denies any chest pain shortness of breath or dyspnea on exertion.  She also has a history of bilateral internal carotid artery stenosis with the left side being 40 to 59% occluded.  This is followed by vascular surgery along with a AAA which was most recently determined to be 2.9 cm by abdominal ultrasound this year.  This is followed annually by vascular surgery.  She also has a history of Schatzki's ring.  She recently had an EGD and underwent dilatation.  She denies any heartburn-like symptoms.  Her Pap smear was performed in March and was normal.  She is not due again for 3 years.  Her mammogram is due later this year and she scheduled this herself.  Her colonoscopy was performed in 2017 and is not due again until 2022.  The remainder of her preventative care is up-to-date. Past Medical History:  Diagnosis Date  . Abdominal aortic aneurysm (AAA) 3.0 cm to 5.0 cm in diameter in female [facility])    None seen on prior imaging?   . Cataract   . Hiatal hernia    small  . Hyperlipidemia   . Hypertension   . Pre-diabetes   . Schatzki's ring   . Tremor    Past Surgical History:  Procedure Laterality Date  . CATARACT EXTRACTION, BILATERAL    . CHOLECYSTECTOMY     . COLONOSCOPY    06/16/2004   KDX:[id number] rectum/Diminutive polyps of the splenic flexure and the cecum/The remainder of the colonic mucosa appeared normal pathology (hyperplastic polyps).  . COLONOSCOPY N/A 08/07/2015   Dr. Gala Romney: 6 mm descending colon tubular adenoma, multiple medium-mouthed diverticula in sigmoid colon. surveillance 2022  . ESOPHAGOGASTRODUODENOSCOPY  09/11/2001   KNL:[id number] ring otherwise normal esophagus/Normal stomach/Normal D1 and D2 status post passage of a 56 [place]oney dilator  . ESOPHAGOGASTRODUODENOSCOPY N/A 11/11/2017   Procedure: ESOPHAGOGASTRODUODENOSCOPY (EGD);  Surgeon: Daneil Dolin, MD;  Location: [facility];  Service: Endoscopy;  Laterality: N/A;  8:15am  . ESOPHAGOGASTRODUODENOSCOPY (EGD) WITH ESOPHAGEAL DILATION N/A 01/25/2013   Dr. Gala Romney- schatzki's ring- 14 F dilation. small hiatal hernia  . MALONEY DILATION N/A 11/11/2017   Procedure: [name] DILATION;  Surgeon: Daneil Dolin, MD;  Location: [facility];  Service: Endoscopy;  Laterality: N/A;  . TUBAL LIGATION     Current Outpatient Medications on File Prior to Visit  Medication Sig Dispense Refill  . ALPRAZolam (XANAX) 1 MG tablet Take 1 mg by mouth daily as needed for anxiety.     Marland Kitchen amLODipine-valsartan (EXFORGE) 10-320 MG tablet TAKE 1 TABLET BY MOUTH DAILY 90 tablet 0  . aspirin EC 81 MG tablet Take 81 mg by mouth every morning.     Marland Kitchen atorvastatin (LIPITOR) 40 MG  tablet Take 1 tablet (40 mg total) by mouth daily. 90 tablet 3  . esomeprazole (NEXIUM) 20 MG capsule Take 20 mg by mouth daily.     . fluticasone (FLONASE) 50 MCG/ACT nasal spray Place 2 sprays into both nostrils daily. 16 g 6  . hydrochlorothiazide (HYDRODIURIL) 25 MG tablet Take 1 tablet (25 mg total) daily by mouth. 90 tablet 3  . metFORMIN (GLUCOPHAGE) 500 MG tablet TAKE 1 TABLET(500 MG) BY MOUTH TWICE DAILY WITH A MEAL 180 tablet 0  . metoprolol succinate (TOPROL-XL) 100 MG 24 hr tablet Take 1 tablet (100 mg total) by  mouth daily. Take with or immediately following a meal. 90 tablet 3  . potassium chloride SA (K-DUR,KLOR-CON) 20 MEQ tablet Take 20 mEq by mouth daily.    . vitamin B-12 (CYANOCOBALAMIN) 1000 MCG tablet Take 1,000 mcg by mouth daily.    . vitamin C (ASCORBIC ACID) 500 MG tablet Take 1,000 mg by mouth daily.     No current facility-administered medications on file prior to visit.    Allergies  Allergen Reactions  . Ciprofloxacin Shortness Of Breath   Social History   Socioeconomic History  . Marital status: Single    Spouse name: Not on file  . Number of children: 3  . Years of education: Not on file  . Highest education level: Not on file  Occupational History  . 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 Financial resource strain: Not on file  . Food insecurity:    Worry: Not on file    Inability: Not on file  . Transportation needs:    Medical: Not on file    Non-medical: Not on file  Tobacco Use  . Smoking status: Former Smoker    Packs/day: 0.25    Years: 35.00    Pack years: 8.75    Types: Cigarettes    Last attempt to quit: 12/31/2012    Years since quitting: 4.9  . Smokeless tobacco: Never Used  Substance and Sexual Activity  . Alcohol use: Yes    Comment: occasionaly  . Drug use: No  . Sexual activity: Yes    Partners: Male    Birth control/protection: Post-menopausal  Lifestyle  . Physical activity:    Days per week: Not on file    Minutes per session: Not on file  . Stress: Not on file  Relationships  . Social connections:    Talks on phone: Not on file    Gets together: Not on file    Attends religious service: Not on file    Active member of club or organization: Not on file    Attends meetings of clubs or organizations: Not on file    Relationship status: Not on file  . Intimate partner violence:    Fear of current or ex partner: Not on file    Emotionally abused: Not on file    Physically abused: Not on file     Forced sexual activity: Not on file  Other Topics Concern  . Not on file  Social History Narrative   Eats all food groups.    Wears seatbelt.    Has 3 children.   Prior smoker.   Attends church.    Used to work in [employer].    Drives.   Enjoys going out with family.    Family History  Problem Relation Age of Onset  . Diabetes Other   . Diabetes Mother   . Heart disease  Mother   . Heart disease Father   . Diabetes Sister   . Diabetes Brother   . Hypertension Brother   . Diabetes Daughter   . Hypertension Maternal Grandmother   . Stroke Maternal Grandfather   . Early death Paternal Grandfather   . Colon cancer Neg Hx   . Liver disease Neg Hx       Review of Systems  All other systems reviewed and are negative.      Objective:   Physical Exam  Constitutional: She is oriented to person, place, and time. She appears well-developed and well-nourished. No distress.  HENT:  Head: Normocephalic and atraumatic.  Right Ear: External ear normal.  Left Ear: External ear normal.  Nose: Nose normal.  Mouth/Throat: Oropharynx is clear and moist. No oropharyngeal exudate.  Eyes: Pupils are equal, round, and reactive to light. Conjunctivae and EOM are normal. Left eye exhibits no chemosis, no discharge, no exudate and no hordeolum. Right conjunctiva is not injected. Left conjunctiva is not injected.    Neck: Normal range of motion. Neck supple. No JVD present. No tracheal deviation present. No thyromegaly present.  Cardiovascular: Normal rate, regular rhythm, normal heart sounds and intact distal pulses. Exam reveals no gallop and no friction rub.  No murmur heard. Pulmonary/Chest: Effort normal and breath sounds normal. No stridor. No respiratory distress. She has no wheezes. She has no rales. She exhibits no tenderness.  Abdominal: Soft. Bowel sounds are normal. She exhibits no distension and no mass. There is no tenderness. There is no rebound and no guarding.  Lymphadenopathy:     She has no cervical adenopathy.  Neurological: She is alert and oriented to person, place, and time. She displays normal reflexes. No cranial nerve deficit or sensory deficit. She exhibits normal muscle tone. Coordination normal.  Skin: Rash noted. She is not diaphoretic. There is erythema.  Vitals reviewed. Patient has a papulosquamous rash around her left eyes diagrammed.  Rash consists of hyperpigmented skin with numerous small papules coalescent with areas of swelling and edema.  It itches.  She has been on 2 weeks of antibiotics with no improvement.  Rash appears to be a contact dermatitis.        Assessment & Plan:  Establishing care with new doctor, encounter for  Controlled type 2 diabetes mellitus with complication, without long-term current use of insulin ([place]) - Plan: CBC with Differential/Platelet, COMPLETE METABOLIC PANEL WITH GFR, Lipid panel, Microalbumin, urine, Hemoglobin A1c  Essential hypertension  Hyperlipidemia LDL goal <70  Carotid artery stenosis without cerebral infarction, bilateral  AAA (abdominal aortic aneurysm) without rupture ([place])  According to her preventative care, her immunizations are up-to-date.  She is due for Pneumovax 23 at age 11.  She is already had a shingles vaccine.  Mammogram will be performed later this year.  Pap smear is up-to-date as is her colonoscopy.  Blood pressure is elevated today but she has whitecoat syndrome.  I will make no changes in her blood pressure medication.  I will check hemoglobin A1c, urine microalbumin, CMP as well as a fasting lipid panel.  Goal LDL cholesterol is less than 70 given her carotid artery stenosis.  She has a audible bruit in the left side.  This is followed by vascular surgery.  Goal hemoglobin A1c is less than 6.5.  AAA is monitored by vascular surgery as well.  I will treat the contact dermatitis around her left eye with a prednisone taper pack.  Recheck next week or sooner if worsening.

## 2017-12-10 LAB — CBC WITH DIFFERENTIAL/PLATELET
Basophils Absolute: 18 cells/uL (ref 0–200)
Basophils Relative: 0.3 %
Eosinophils Absolute: 240 cells/uL (ref 15–500)
Eosinophils Relative: 4 %
HCT: 39 % (ref 35.0–45.0)
Hemoglobin: 13.1 g/dL (ref 11.7–15.5)
Lymphs Abs: 2910 cells/uL (ref 850–3900)
MCH: 29.2 pg (ref 27.0–33.0)
MCHC: 33.6 g/dL (ref 32.0–36.0)
MCV: 87.1 fL (ref 80.0–100.0)
MPV: 9.7 fL (ref 7.5–12.5)
Monocytes Relative: 7.8 %
Neutro Abs: 2364 cells/uL (ref 1500–7800)
Neutrophils Relative %: 39.4 %
Platelets: 307 10*3/uL (ref 140–400)
RBC: 4.48 10*6/uL (ref 3.80–5.10)
RDW: 12.5 % (ref 11.0–15.0)
Total Lymphocyte: 48.5 %
WBC mixed population: 468 cells/uL (ref 200–950)
WBC: 6 10*3/uL (ref 3.8–10.8)

## 2017-12-10 LAB — COMPLETE METABOLIC PANEL WITH GFR
AG Ratio: 1.8 (calc) (ref 1.0–2.5)
ALT: 20 U/L (ref 6–29)
AST: 18 U/L (ref 10–35)
Albumin: 4.7 g/dL (ref 3.6–5.1)
Alkaline phosphatase (APISO): 83 U/L (ref 33–130)
BUN: 11 mg/dL (ref 7–25)
CO2: 25 mmol/L (ref 20–32)
Calcium: 10.2 mg/dL (ref 8.6–10.4)
Chloride: 102 mmol/L (ref 98–110)
Creat: 0.85 mg/dL (ref 0.50–0.99)
GFR, Est African American: 85 mL/min/{1.73_m2} (ref >=60)
GFR, Est Non African American: 73 mL/min/{1.73_m2} (ref >=60)
Globulin: 2.6 g/dL (calc) (ref 1.9–3.7)
Glucose, Bld: 107 mg/dL — ABNORMAL HIGH (ref 65–99)
Potassium: 4.1 mmol/L (ref 3.5–5.3)
Sodium: 137 mmol/L (ref 135–146)
Total Bilirubin: 0.6 mg/dL (ref 0.2–1.2)
Total Protein: 7.3 g/dL (ref 6.1–8.1)

## 2017-12-10 LAB — HEMOGLOBIN A1C
Hgb A1c MFr Bld: 6.5 % of total Hgb — ABNORMAL HIGH (ref <5.7)
Mean Plasma Glucose: 140 (calc)
eAG (mmol/L): 7.7 (calc)

## 2017-12-10 LAB — LIPID PANEL
Cholesterol: 181 mg/dL (ref <200)
HDL: 51 mg/dL (ref >50)
LDL Cholesterol (Calc): 111 mg/dL (calc) — ABNORMAL HIGH
Non-HDL Cholesterol (Calc): 130 mg/dL (calc) — ABNORMAL HIGH (ref <130)
Total CHOL/HDL Ratio: 3.5 (calc) (ref <5.0)
Triglycerides: 89 mg/dL (ref <150)

## 2017-12-10 LAB — MICROALBUMIN, URINE: Microalb, Ur: 0.4 mg/dL

## 2017-12-12 ENCOUNTER — Encounter: Payer: Self-pay | Admitting: Family Medicine

## 2017-12-12 ENCOUNTER — Encounter (INDEPENDENT_AMBULATORY_CARE_PROVIDER_SITE_OTHER): Payer: Self-pay

## 2017-12-12 MED ORDER — ATORVASTATIN CALCIUM 80 MG PO TABS: 80.0000 mg | ORAL_TABLET | Freq: Every day | ORAL | 3 refills | Status: DC

## 2017-12-14 ENCOUNTER — Other Ambulatory Visit: Payer: Self-pay | Admitting: Family Medicine

## 2017-12-14 MED ORDER — ATORVASTATIN CALCIUM 80 MG PO TABS: 80.0000 mg | ORAL_TABLET | Freq: Every day | ORAL | 3 refills | Status: DC

## 2017-12-16 ENCOUNTER — Ambulatory Visit: Payer: BLUE CROSS/BLUE SHIELD | Admitting: Family Medicine

## 2017-12-26 ENCOUNTER — Other Ambulatory Visit: Payer: Self-pay | Admitting: Family Medicine

## 2017-12-28 ENCOUNTER — Other Ambulatory Visit: Payer: Self-pay | Admitting: Family Medicine

## 2017-12-28 ENCOUNTER — Emergency Department (HOSPITAL_COMMUNITY)
Admission: EM | Admit: 2017-12-28 | Discharge: 2017-12-28 | Disposition: A | Discharge status: Home / Self Care | Payer: BLUE CROSS/BLUE SHIELD | Attending: Emergency Medicine | Admitting: Emergency Medicine

## 2017-12-28 ENCOUNTER — Other Ambulatory Visit: Payer: Self-pay

## 2017-12-28 ENCOUNTER — Emergency Department (HOSPITAL_COMMUNITY): Payer: BLUE CROSS/BLUE SHIELD

## 2017-12-28 ENCOUNTER — Encounter (HOSPITAL_COMMUNITY): Payer: Self-pay | Admitting: Emergency Medicine

## 2017-12-28 DIAGNOSIS — G8929 Other chronic pain: Secondary | ICD-10-CM | POA: Diagnosis not present

## 2017-12-28 DIAGNOSIS — E119 Type 2 diabetes mellitus without complications: Secondary | ICD-10-CM | POA: Insufficient documentation

## 2017-12-28 DIAGNOSIS — Z7982 Long term (current) use of aspirin: Secondary | ICD-10-CM | POA: Diagnosis not present

## 2017-12-28 DIAGNOSIS — R1030 Lower abdominal pain, unspecified: Secondary | ICD-10-CM | POA: Diagnosis not present

## 2017-12-28 DIAGNOSIS — I10 Essential (primary) hypertension: Secondary | ICD-10-CM | POA: Diagnosis not present

## 2017-12-28 DIAGNOSIS — R109 Unspecified abdominal pain: Secondary | ICD-10-CM | POA: Diagnosis not present

## 2017-12-28 DIAGNOSIS — R519 Headache, unspecified: Secondary | ICD-10-CM

## 2017-12-28 DIAGNOSIS — Z79899 Other long term (current) drug therapy: Secondary | ICD-10-CM | POA: Diagnosis not present

## 2017-12-28 DIAGNOSIS — Z87891 Personal history of nicotine dependence: Secondary | ICD-10-CM | POA: Insufficient documentation

## 2017-12-28 DIAGNOSIS — G43709 Chronic migraine without aura, not intractable, without status migrainosus: Secondary | ICD-10-CM | POA: Diagnosis not present

## 2017-12-28 DIAGNOSIS — Z7984 Long term (current) use of oral hypoglycemic drugs: Secondary | ICD-10-CM | POA: Insufficient documentation

## 2017-12-28 DIAGNOSIS — M25512 Pain in left shoulder: Secondary | ICD-10-CM | POA: Insufficient documentation

## 2017-12-28 DIAGNOSIS — R51 Headache: Secondary | ICD-10-CM | POA: Insufficient documentation

## 2017-12-28 DIAGNOSIS — K5730 Diverticulosis of large intestine without perforation or abscess without bleeding: Secondary | ICD-10-CM | POA: Diagnosis not present

## 2017-12-28 HISTORY — DX: Prediabetes: R73.03

## 2017-12-28 HISTORY — DX: Pain in left shoulder: M25.512

## 2017-12-28 HISTORY — DX: Headache, unspecified: R51.9

## 2017-12-28 HISTORY — DX: Headache: R51

## 2017-12-28 HISTORY — DX: Other chronic pain: G89.29

## 2017-12-28 LAB — CBC WITH DIFFERENTIAL/PLATELET
Basophils Absolute: 0 10*3/uL (ref 0.0–0.1)
Basophils Relative: 0 %
Eosinophils Absolute: 0.2 10*3/uL (ref 0.0–0.7)
Eosinophils Relative: 3 %
HCT: 37.9 % (ref 36.0–46.0)
Hemoglobin: 12.5 g/dL (ref 12.0–15.0)
Lymphocytes Relative: 46 %
Lymphs Abs: 3.2 10*3/uL (ref 0.7–4.0)
MCH: 29.3 pg (ref 26.0–34.0)
MCHC: 33 g/dL (ref 30.0–36.0)
MCV: 89 fL (ref 78.0–100.0)
Monocytes Absolute: 0.4 10*3/uL (ref 0.1–1.0)
Monocytes Relative: 6 %
Neutro Abs: 3.2 10*3/uL (ref 1.7–7.7)
Neutrophils Relative %: 45 %
Platelets: 306 10*3/uL (ref 150–400)
RBC: 4.26 MIL/uL (ref 3.87–5.11)
RDW: 12.7 % (ref 11.5–15.5)
WBC: 7 10*3/uL (ref 4.0–10.5)

## 2017-12-28 LAB — URINALYSIS, ROUTINE W REFLEX MICROSCOPIC
Bacteria, UA: NONE SEEN
Bilirubin Urine: NEGATIVE
Glucose, UA: NEGATIVE mg/dL
Ketones, ur: NEGATIVE mg/dL
Leukocytes, UA: NEGATIVE
Nitrite: NEGATIVE
Protein, ur: NEGATIVE mg/dL
Specific Gravity, Urine: 1.002 — ABNORMAL LOW (ref 1.005–1.030)
pH: 6 (ref 5.0–8.0)

## 2017-12-28 LAB — TROPONIN I: Troponin I: <0.03 ng/mL (ref <0.03)

## 2017-12-28 LAB — BASIC METABOLIC PANEL
Anion gap: 13 (ref 5–15)
BUN: 8 mg/dL (ref 8–23)
CO2: 25 mmol/L (ref 22–32)
Calcium: 9.3 mg/dL (ref 8.9–10.3)
Chloride: 98 mmol/L (ref 98–111)
Creatinine, Ser: 0.84 mg/dL (ref 0.44–1.00)
GFR calc Af Amer: >60 mL/min (ref >60)
GFR calc non Af Amer: >60 mL/min (ref >60)
Glucose, Bld: 104 mg/dL — ABNORMAL HIGH (ref 70–99)
Potassium: 3.3 mmol/L — ABNORMAL LOW (ref 3.5–5.1)
Sodium: 136 mmol/L (ref 135–145)

## 2017-12-28 LAB — GLUCOSE, CAPILLARY: Glucose-Capillary: 107 mg/dL — ABNORMAL HIGH (ref 70–99)

## 2017-12-28 MED ORDER — OXYCODONE-ACETAMINOPHEN 5-325 MG PO TABS
1.0000 | ORAL_TABLET | Freq: Once | ORAL | Status: AC
  Administered 2017-12-28: 1 via ORAL
  Filled 2017-12-28: qty 1

## 2017-12-28 MED ORDER — AMLODIPINE BESYLATE-VALSARTAN 10-320 MG PO TABS: 1.0000 | ORAL_TABLET | Freq: Every day | ORAL | 2 refills | Status: DC

## 2017-12-28 MED ORDER — TRAMADOL HCL 50 MG PO TABS: 50.0000 mg | ORAL_TABLET | Freq: Four times a day (QID) | ORAL | 0 refills | Status: DC | PRN

## 2017-12-28 NOTE — ED Provider Notes (Signed)
[facility] EMERGENCY DEPARTMENT Provider Note   CSN: [id number] Arrival date & time: 12/28/17  1925     History   Chief Complaint Chief Complaint  Patient presents with  . Hypertension  . Abdominal Pain  . Headache    HPI [name] is a 62 y.o. female.  HPI  Pt was seen at 2105. Per pt, c/o gradual onset and persistence of constant multiple symptoms for the past 2 days. Pt states she has been taking her BP while she has been in pain and "it's up." Pt c/o acute flair of her chronic migraine headache.  Describes the headache per her usual chronic migraine headache pain pattern.  Denies headache was sudden or maximal in onset or at any time.  Denies visual changes, no focal motor weakness, no tingling/numbness in extremities, no fevers, no neck pain, no rash. Pt also c/o lower abd/pelvic "pain."  Has been associated with mild nausea. Describes the abd pain as "cramping."  Denies vomiting/diarrhea, no fevers, no back pain, no rash, no CP/SOB, no black or blood in stools. Pt also c/o acute flair of her chronic left shoulder "pains." Pt describes the pain as per her usual "arthritis" pain. Denies any change in her usual pain pattern. Denies injury, no fevers, no focal motor weakness, no tingling/numbness in extremities, no CP/SOB.      Past Medical History:  Diagnosis Date  . Abdominal aortic aneurysm (AAA) 3.0 cm to 5.0 cm in diameter in female [facility])    None seen on prior imaging?   . Cataract   . Chronic left shoulder pain   . Headache   . Hiatal hernia    small  . Hyperlipidemia   . Hypertension   . Pre-diabetes   . Prediabetes   . Schatzki's ring   . Tremor     Patient Active Problem List   Diagnosis Date Noted  . Controlled diabetes mellitus type 2 with complications ([place]) 66/44/0347  . Essential hypertension 04/25/2017  . Hyperlipidemia LDL goal <70 04/25/2017  . Carotid artery stenosis without cerebral infarction, bilateral 04/25/2017  . AAA (abdominal  aortic aneurysm) without rupture ([place]) 04/25/2017  . History of colonic polyps   . Diverticulosis of colon without hemorrhage   . RUQ pain 02/21/2013  . Abdominal pain, epigastric 01/05/2013  . Abdominal bruit 01/05/2013  . Esophageal dysphagia 01/05/2013  . Unspecified constipation 01/05/2013  . GERD (gastroesophageal reflux disease) 01/05/2013    Past Surgical History:  Procedure Laterality Date  . CARDIAC CATHETERIZATION  2005   "Ms. Kabler has essentially normal coronary arteries and normal left ventricular function.  She will be treated empirically with anti-reflux measures"  . CATARACT EXTRACTION, BILATERAL    . CHOLECYSTECTOMY    . COLONOSCOPY    06/16/2004   QQV:ZDGLOV rectum/Diminutive polyps of the splenic flexure and the cecum/The remainder of the colonic mucosa appeared normal pathology (hyperplastic polyps).  . COLONOSCOPY N/A 08/07/2015   Dr. Gala Romney: 6 mm descending colon tubular adenoma, multiple medium-mouthed diverticula in sigmoid colon. surveillance 2022  . ESOPHAGOGASTRODUODENOSCOPY  09/11/2001   FIE:PPIRJJOAC ring otherwise normal esophagus/Normal stomach/Normal D1 and D2 status post passage of a 56 [place] dilator  . ESOPHAGOGASTRODUODENOSCOPY N/A 11/11/2017   Procedure: ESOPHAGOGASTRODUODENOSCOPY (EGD);  Surgeon: Daneil Dolin, MD;  Location: [facility] ENDO SUITE;  Service: Endoscopy;  Laterality: N/A;  8:15am  . ESOPHAGOGASTRODUODENOSCOPY (EGD) WITH ESOPHAGEAL DILATION N/A 01/25/2013   Dr. Gala Romney- schatzki's ring- 17 F dilation. small hiatal hernia  . MALONEY DILATION N/A 11/11/2017  Procedure: MALONEY DILATION;  Surgeon: Daneil Dolin, MD;  Location: [facility] ENDO SUITE;  Service: Endoscopy;  Laterality: N/A;  . TUBAL LIGATION       OB History    Gravida  4   Para  3   Term  3   Preterm      AB  1   Living  3     SAB  1   TAB      Ectopic      Multiple      Live Births               Home Medications    Prior to Admission medications     Medication Sig Start Date End Date Taking? Authorizing Provider  ALPRAZolam Duanne Moron) 1 MG tablet Take 1 mg by mouth daily as needed for anxiety.  12/27/12  Yes [provider]  amLODipine-valsartan (EXFORGE) 10-320 MG tablet Take 1 tablet by mouth daily. 12/28/17  Yes Susy Frizzle, MD  aspirin EC 81 MG tablet Take 81 mg by mouth every morning.    Yes [provider]  atorvastatin (LIPITOR) 80 MG tablet Take 1 tablet (80 mg total) by mouth daily. 12/14/17  Yes Susy Frizzle, MD  augmented betamethasone dipropionate (DIPROLENE-AF) 0.05 % cream Apply 1 application topically daily as needed. 12/15/17  Yes [provider]  cetirizine (ZYRTEC) 10 MG tablet Take 10 mg by mouth daily.   Yes [provider]  esomeprazole (NEXIUM) 40 MG capsule Take 40 mg by mouth daily.    Yes [provider]  fluticasone (FLONASE) 50 MCG/ACT nasal spray Place 2 sprays into both nostrils daily. 07/26/17  Yes Hagler, Apolonio Schneiders, MD  hydrochlorothiazide (HYDRODIURIL) 25 MG tablet Take 1 tablet (25 mg total) daily by mouth. 03/14/17  Yes Hagler, Apolonio Schneiders, MD  metFORMIN (GLUCOPHAGE) 500 MG tablet TAKE 1 TABLET(500 MG) BY MOUTH TWICE DAILY WITH A MEAL Patient taking differently: Take 500 mg by mouth 2 (two) times daily with a meal.  09/26/17  Yes Hagler, Apolonio Schneiders, MD  metoprolol succinate (TOPROL-XL) 100 MG 24 hr tablet Take 1 tablet (100 mg total) by mouth daily. Take with or immediately following a meal. 04/25/17  Yes Hagler, Apolonio Schneiders, MD  oxymetazoline (NASAL SPRAY EXTRA MOISTURIZING) 0.05 % nasal spray Place 1 spray into both nostrils 2 (two) times daily as needed for congestion.   Yes [provider]  potassium chloride SA (K-DUR,KLOR-CON) 20 MEQ tablet Take 20 mEq by mouth daily.   Yes [provider]  vitamin B-12 (CYANOCOBALAMIN) 1000 MCG tablet Take 1,000 mcg by mouth daily.   Yes [provider]  vitamin C (ASCORBIC ACID) 500 MG tablet Take 1,000 mg by mouth  daily.   Yes [provider]  predniSONE (DELTASONE) 20 MG tablet 3 tabs poqday 1-2, 2 tabs poqday 3-4, 1 tab poqday 5-6 Patient not taking: Reported on 12/28/2017 12/09/17   Susy Frizzle, MD    Family History Family History  Problem Relation Age of Onset  . Diabetes Other   . Diabetes Mother   . Heart disease Mother   . Heart disease Father   . Diabetes Sister   . Diabetes Brother   . Hypertension Brother   . Diabetes Daughter   . Hypertension Maternal Grandmother   . Stroke Maternal Grandfather   . Early death Paternal Grandfather   . Colon cancer Neg Hx   . Liver disease Neg Hx     Social History Social History  Tobacco Use  . Smoking status: Former Smoker    Packs/day: 0.25    Years: 35.00    Pack years: 8.75    Types: Cigarettes    Last attempt to quit: 12/31/2012    Years since quitting: 4.9  . Smokeless tobacco: Never Used  Substance Use Topics  . Alcohol use: Yes    Comment: occasionaly  . Drug use: No     Allergies   Ciprofloxacin   Review of Systems Review of Systems ROS: Statement: All systems negative except as marked or noted in the HPI; Constitutional: Negative for fever and chills. ; ; Eyes: Negative for eye pain, redness and discharge. ; ; ENMT: Negative for ear pain, hoarseness, nasal congestion, sinus pressure and sore throat. ; ; Cardiovascular: Negative for chest pain, palpitations, diaphoresis, dyspnea and peripheral edema. ; ; Respiratory: Negative for cough, wheezing and stridor. ; ; Gastrointestinal: +lower abd/pelvic pain pain, nausea. Negative for vomiting, diarrhea, blood in stool, hematemesis, jaundice and rectal bleeding. . ; ; Genitourinary: Negative for dysuria, flank pain and hematuria. ; ; GYN:  No vaginal bleeding, no vaginal discharge, no vulvar pain. ;; Musculoskeletal: +chronic shoulder pain. Negative for back pain and neck pain. Negative for swelling and trauma.; ; Skin: Negative for pruritus, rash, abrasions, blisters,  bruising and skin lesion.; ; Neuro: +chronic headache. Negative for lightheadedness and neck stiffness. Negative for weakness, altered level of consciousness, altered mental status, extremity weakness, paresthesias, involuntary movement, seizure and syncope.       Physical Exam Updated Vital Signs BP (!) 165/75   Pulse 69   Temp 98.2 F (36.8 C) (Oral)   Resp 16   SpO2 100%    BP 136/66   Pulse 67   Temp 98.2 F (36.8 C) (Oral)   Resp 16   SpO2 100%     Physical Exam 2110: Physical examination:  Nursing notes reviewed; Vital signs and O2 SAT reviewed;  Constitutional: Well developed, Well nourished, Well hydrated, In no acute distress; Head:  Normocephalic, atraumatic. No mastoid tenderness. No temporal artery tenderness.; Eyes: EOMI, PERRL, No scleral icterus; ENMT: TM's clear bilat. +edemetous nasal turbinates bilat with clear rhinorrhea. Mouth and pharynx normal, Mucous membranes moist; Neck: Supple, Full range of motion, No lymphadenopathy; Cardiovascular: Regular rate and rhythm, No gallop; Respiratory: Breath sounds clear & equal bilaterally, No wheezes.  Speaking full sentences with ease, Normal respiratory effort/excursion; Chest: Nontender, Movement normal; Abdomen: Soft, Nontender.  Nondistended, Normal bowel sounds; Genitourinary: No CVA tenderness; Spine:  No midline CS, TS, LS tenderness.;;  Extremities: Peripheral pulses normal. +left shoulder w/FROM.  +generalized TTP proximal shoulder entire joint, AC joint. Left clavicle NT, scapula NT, proximal humerus NT, biceps tendon NT over bicipital groove.  Motor strength at shoulder normal.  Sensation intact over deltoid region, distal NMS intact with left hand having intact and equal sensation and strength in the distribution of the median, radial, and ulnar nerve function compared to opposite side.  Strong radial pulse.  +FROM left elbow with intact motor strength biceps and triceps muscles to resistance.  No deformity, No edema,  No calf edema or asymmetry.; Neuro: AA&Ox3, Major CN grossly intact. No facial droop. Speech clear. No gross focal motor or sensory deficits in extremities. Climbs on and off stretcher easily by herself. Gait steady..; Skin: Color normal, Warm, Dry.   ED Treatments / Results  Labs (all labs ordered are listed, but only abnormal results are displayed)   EKG None  Radiology   Procedures Procedures (including  critical care time)  Medications Ordered in ED Medications  oxyCODONE-acetaminophen (PERCOCET/ROXICET) 5-325 MG per tablet 1 tablet (1 tablet Oral Given 12/28/17 2129)     Initial Impression / Assessment and Plan / ED Course  I have reviewed the triage vital signs and the nursing notes.  Pertinent labs & imaging results that were available during my care of the patient were reviewed by me and considered in my medical decision making (see chart for details).  MDM Reviewed: previous chart, nursing note and vitals Reviewed previous: labs and ECG Interpretation: labs, ECG, x-ray and CT scan    ED ECG REPORT   Date: 12/28/2017  Rate: 77  Rhythm: normal sinus rhythm  QRS Axis: normal  Intervals: normal  ST/T Wave abnormalities: normal  Conduction Disutrbances:none  Narrative Interpretation:   Old EKG Reviewed: unchanged; no significant changes compared to previous EKG dated 06/22/2015. I have personally reviewed the EKG tracing and agree with the computerized printout as noted.  Results for orders placed or performed during the hospital encounter of 12/28/17  CBC with Differential  Result Value Ref Range   WBC 7.0 4.0 - 10.5 K/uL   RBC 4.26 3.87 - 5.11 MIL/uL   Hemoglobin 12.5 12.0 - 15.0 g/dL   HCT 37.9 36.0 - 46.0 %   MCV 89.0 78.0 - 100.0 fL   MCH 29.3 26.0 - 34.0 pg   MCHC 33.0 30.0 - 36.0 g/dL   RDW 12.7 11.5 - 15.5 %   Platelets 306 150 - 400 K/uL   Neutrophils Relative % 45 %   Neutro Abs 3.2 1.7 - 7.7 K/uL   Lymphocytes Relative 46 %   Lymphs Abs 3.2  0.7 - 4.0 K/uL   Monocytes Relative 6 %   Monocytes Absolute 0.4 0.1 - 1.0 K/uL   Eosinophils Relative 3 %   Eosinophils Absolute 0.2 0.0 - 0.7 K/uL   Basophils Relative 0 %   Basophils Absolute 0.0 0.0 - 0.1 K/uL  Basic metabolic panel  Result Value Ref Range   Sodium 136 135 - 145 mmol/L   Potassium 3.3 (L) 3.5 - 5.1 mmol/L   Chloride 98 98 - 111 mmol/L   CO2 25 22 - 32 mmol/L   Glucose, Bld 104 (H) 70 - 99 mg/dL   BUN 8 8 - 23 mg/dL   Creatinine, Ser 0.84 0.44 - 1.00 mg/dL   Calcium 9.3 8.9 - 10.3 mg/dL   GFR calc non Af Amer >60 >60 mL/min   GFR calc Af Amer >60 >60 mL/min   Anion gap 13 5 - 15  Troponin I  Result Value Ref Range   Troponin I <0.03 <0.03 ng/mL  Glucose, capillary  Result Value Ref Range   Glucose-Capillary 107 (H) 70 - 99 mg/dL  Urinalysis, Routine w reflex microscopic  Result Value Ref Range   Color, Urine COLORLESS (A) YELLOW   APPearance CLEAR CLEAR   Specific Gravity, Urine 1.002 (L) 1.005 - 1.030   pH 6.0 5.0 - 8.0   Glucose, UA NEGATIVE NEGATIVE mg/dL   Hgb urine dipstick SMALL (A) NEGATIVE   Bilirubin Urine NEGATIVE NEGATIVE   Ketones, ur NEGATIVE NEGATIVE mg/dL   Protein, ur NEGATIVE NEGATIVE mg/dL   Nitrite NEGATIVE NEGATIVE   Leukocytes, UA NEGATIVE NEGATIVE   RBC / HPF 0-5 0 - 5 RBC/hpf   Bacteria, UA NONE SEEN NONE SEEN   Dg Chest 2 View Result Date: 12/28/2017 CLINICAL DATA:  Hypertension EXAM: CHEST - 2 VIEW COMPARISON:  06/22/2015 FINDINGS: No  focal opacity or pleural effusion. Normal heart size. Aortic atherosclerosis. No pneumothorax. Surgical clips in the right upper quadrant IMPRESSION: No active cardiopulmonary disease. Electronically Signed   By: Donavan Foil M.D.   On: 12/28/2017 20:11   Ct Head Wo Contrast Result Date: 12/28/2017 CLINICAL DATA:  Headache and hypertension.  Nausea. EXAM: CT HEAD WITHOUT CONTRAST TECHNIQUE: Contiguous axial images were obtained from the base of the skull through the vertex without intravenous  contrast. COMPARISON:  Sep 15, 2014 FINDINGS: Brain: The ventricles are normal in size and configuration. There is a small cavum septum pellucidum, an anatomic variant. 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 There is no intracranial mass, hemorrhage, extra-axial fluid collection, or midline shift. The gray-white compartments are normal. No evident acute infarct. Vascular: No hyperdense vessel. There is calcification in the distal right vertebral artery as well as in each carotid siphon region. Skull: The bony calvarium appears intact. Sinuses/Orbits: There is mucosal thickening in multiple ethmoid air cells. Other visualized paranasal sinuses are clear. Visualized orbits appear symmetric bilaterally. Other: Mastoid air cells on the left are clear. There is opacification of several inferior mastoid air cells on the right. 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 The clinical significance of this finding is uncertain. There is no intracranial mass or hemorrhage. Gray-white compartments appear normal. There are foci of arterial vascular calcification. There is mucosal thickening in multiple ethmoid air cells. There is opacification of several inferior mastoid air cells on the right. Electronically Signed   By: Lowella Grip III M.D.   On: 12/28/2017 22:00   Dg Shoulder Left Result Date: 12/28/2017 CLINICAL DATA:  Chronic left shoulder pain for a year. EXAM: LEFT SHOULDER - 2+ VIEW COMPARISON:  None. FINDINGS: Osteoarthritic spurring off the inferomedial humeral head and glenoid is identified. No acute fracture joint dislocations. No soft tissue mass or mineralization. Lesser degree of joint space narrowing and spurring is seen of the left acromioclavicular joint. The adjacent ribs and lung are nonacute. There is moderate aortic atherosclerosis at the arch. IMPRESSION: Glenohumeral and AC joint osteoarthritis. No acute osseous abnormality. Electronically Signed   By: Ashley Royalty M.D.   On: 12/28/2017 22:10   Ct Renal Stone Study Result Date: 12/28/2017 CLINICAL DATA:  Hypertension, headache and lower abdominal pain. EXAM: CT ABDOMEN AND PELVIS WITHOUT CONTRAST TECHNIQUE: Multidetector CT imaging of the abdomen and pelvis was performed following the standard protocol without IV contrast. COMPARISON:  CT exams dating back through 01/03/2008. FINDINGS: Lower chest: Heart size is within normal limits. Stable mild left atrial enlargement. No pericardial effusion. Hepatobiliary: Cholecystectomy. No biliary dilatation. Given limitations of a noncontrast study, no hepatic mass. Pancreas: Normal without inflammation or ductal dilatation. No focal mass. Spleen: Normal size spleen. Adrenals/Urinary Tract: Cortical scarring of the right kidney is identified along the interpolar and lower pole. Small exophytic masslike lesion off the lower pole the right kidney measuring approximately 1.3 x 1.6 x 0.9 cm is likely related to normal spared renal cortex with surrounding areas of scarring as this appears stable dating back to 2009. Larger bilobed appearing water attenuating cyst off the lower pole the right kidney measuring 4.4 x 4.9 x 5.4 cm overall is identified without worrisome features. No nephrolithiasis nor hydroureteronephrosis. The urinary bladder is unremarkable for the degree of distention. Mild nodularity of the left adrenal gland, similar in appearance to priors. Stomach/Bowel: Small hiatal hernia. Nondistended stomach with normal small bowel rotation. No bowel obstruction or inflammation.  Increased fecal retention throughout the colon with scattered colonic diverticulosis along the distal descending and to a greater degree sigmoid colon. Vascular/Lymphatic: Moderate aortoiliac branch vessel atherosclerosis without aneurysm. Adenopathy. Reproductive: Uterus and adnexa are unremarkable. Other: No free air nor free fluid. Musculoskeletal: Degenerative disc disease L5-S1. Joint space  narrowing, sclerosis and facet hypertrophy from L2 through S1. IMPRESSION: 1. Distal descending sigmoid colonic diverticulosis without acute diverticulitis. Increased fecal retention compatible with constipation. 2. 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 Pseudolesion off the lower pole the right kidney secondary to surrounding cortical scarring is identified and stable dating back to 2009. 3. 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 4. Lumbar facet arthropathy with degenerative disc disease L5-S1. Electronically Signed   By: Ashley Royalty M.D.   On: 12/28/2017 22:07    2320:  Pt refuses pelvic exam. Risks/benefits explained; pt continues to refuse. Pt makes her own medical decisions. Workup reassuring. Tx symptomatically at this time. Dx and testing d/w pt.  Questions answered.  Verb understanding, agreeable to d/c home with outpt f/u.    Final Clinical Impressions(s) / ED Diagnoses   Final diagnoses:  None    ED Discharge Orders    None       Francine Graven, DO 12/31/17 1213

## 2017-12-28 NOTE — ED Triage Notes (Signed)
Pt states bp up today. C/o headache, lower abd pain, nausea, left shoulder blade pain.  Denies gu sx'. lnbm this am. Nad.

## 2017-12-28 NOTE — Discharge Instructions (Addendum)
Take the prescription as directed. May also take over the counter tylenol, as directed on packaging, as needed for discomfort.  Apply moist heat or ice to the area(s) of discomfort, for 15 minutes at a time, several times per day for the next few days.  Do not fall asleep on a heating or ice pack. Your CT scan showed a large amount of stool in your colon. Take over the counter laxative (such as miralax, milk of magnesia, senokot) AND a dulcolax suppository or enema today and repeat both tomorrow.  Begin to take over the counter stool softener (colace, miralax), as directed on packaging, for the next several weeks. Increase the fiber in your diet. Call your regular medical doctor tomorrow to schedule a follow up appointment this week.  Call your Orthopedic doctor tomorrow to schedule a follow up appointment within the next week. Return to the Emergency Department immediately if worsening.

## 2017-12-30 LAB — URINE CULTURE

## 2018-01-02 ENCOUNTER — Ambulatory Visit (INDEPENDENT_AMBULATORY_CARE_PROVIDER_SITE_OTHER): Payer: BLUE CROSS/BLUE SHIELD | Admitting: Neurology

## 2018-01-02 DIAGNOSIS — G4720 Circadian rhythm sleep disorder, unspecified type: Secondary | ICD-10-CM

## 2018-01-02 DIAGNOSIS — R0683 Snoring: Secondary | ICD-10-CM

## 2018-01-02 DIAGNOSIS — G479 Sleep disorder, unspecified: Secondary | ICD-10-CM

## 2018-01-02 DIAGNOSIS — G4733 Obstructive sleep apnea (adult) (pediatric): Secondary | ICD-10-CM | POA: Diagnosis not present

## 2018-01-02 DIAGNOSIS — E663 Overweight: Secondary | ICD-10-CM

## 2018-01-03 ENCOUNTER — Telehealth: Payer: Self-pay | Admitting: Neurology

## 2018-01-03 NOTE — Telephone Encounter (Signed)
Called the patient and reviewed in detail with the patient her sleep study results as mentioned by Dr Rexene Alberts. I reviewed Dr Guadelupe Sabin recommendations and informed her of ways to help avoid mild apnea. Informed her that if snoring was bothersome she would recommend a dental referral to have a dental device made that helps with treatment of snoring. Educated on weight loss and to avoid sleeping on her back. Pt verbalized understanding. Pt had no questions at this time but was encouraged to call back if questions arise. Pt informed that she can follow up with her PCP. Pt was appreciative for the call.

## 2018-01-03 NOTE — Progress Notes (Signed)
Patient referred by Dr. Mannie Stabile, seen by me on 11/15/17, diagnostic PSG on 01/02/18.   Please call and notify the patient that the recent sleep study did not show any significant obstructive sleep apnea. 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 For this, CPAP therapy is not warranted. Weight loss and avoidance of the supine sleep position will likely suffice as treatment. Mild snoring was noted. For disturbing snoring, an oral appliance (through a qualified dentist) can be considered. She can FU with PCP at this point.  Thanks,  Star Age, MD, PhD [facility])

## 2018-01-03 NOTE — Telephone Encounter (Signed)
Called patient to discuss sleep study results. No answer at this time. LVM for the patient to call back.   

## 2018-01-03 NOTE — Telephone Encounter (Signed)
-----   Message from Star Age, MD sent at 01/03/2018  8:21 AM EDT ----- Patient referred by Dr. Mannie Stabile, seen by me on 11/15/17, diagnostic PSG on 01/02/18.   Please call and notify the patient that the recent sleep study did not show any significant obstructive sleep apnea. 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 For this, CPAP therapy is not warranted. Weight loss and avoidance of the supine sleep position will likely suffice as treatment. Mild snoring was noted. For disturbing snoring, an oral appliance (through a qualified dentist) can be considered. She can FU with PCP at this point.  Thanks,  Star Age, MD, PhD [facility])

## 2018-01-03 NOTE — Telephone Encounter (Signed)
Pt has returned the calll to RN Myriam Jacobson, please call

## 2018-01-03 NOTE — Procedures (Signed)
PATIENT'S NAME:  [name], [name] DOB:      [birth year]      MR#:    [id number]     DATE OF RECORDING: 01/02/2018 REFERRING M.D.:  Caren Macadam MD Study Performed:   Baseline Polysomnogram HISTORY:  62 year old woman with a history of hyperlipidemia, hypertension, hiatal hernia, tremor, AAA and mildly overweight state, who reports issues with her sleep including snoring and nonrestorative sleep. A prior sleep study showed borderline OSA. The patient endorsed the Epworth Sleepiness Scale at 7 points. The patient's weight 181 pounds with a height of 67 (inches), resulting in a BMI of 28.4 kg/m2. The patient's neck circumference measured 14.8 inches.  CURRENT MEDICATIONS: Tylenol, Xanax, Exforge, Aspirin, Lipitor, Zyrtec, Nexium, Flonase, Hydrodiuril, Metformin, Toprol, Klorcon.   PROCEDURE:  This is a multichannel digital polysomnogram utilizing the Somnostar 11.2 system.  Electrodes and sensors were applied and monitored per AASM Specifications.   EEG, EOG, Chin and Limb EMG, were sampled at 200 Hz.  ECG, Snore and Nasal Pressure, Thermal Airflow, Respiratory Effort, CPAP Flow and Pressure, Oximetry was sampled at 50 Hz. Digital video and audio were recorded.      BASELINE STUDY  Lights Out was at 22:50 and Lights On at 05:00.  Total recording time (TRT) was 370 minutes, with a total sleep time (TST) of 293 minutes.   The patient's sleep latency was 23 minutes.  REM latency was 73 minutes, which is normal. The sleep efficiency was 79.2%.     SLEEP ARCHITECTURE: WASO (Wake after sleep onset) was 60 minutes with mild to moderate sleep fragmentation noted. There were 30.5 minutes in Stage N1, 108.5 minutes Stage N2, 86.5 minutes Stage N3 and 67.5 minutes in Stage REM.  The percentage of Stage N1 was 10.4%, which is increased, Stage N2 was 37.%, Stage N3 was 29.5%, which is increased, and Stage R (REM sleep) was 23.%, which is normal. The arousals were noted as: 43 were spontaneous, 0 were associated with  PLMs, 1 were associated with respiratory events.  RESPIRATORY ANALYSIS:  There were a total of 21 respiratory events:  0 obstructive apneas, 0 central apneas and 0 mixed apneas with a total of 0 apneas and an apnea index (AI) of 0 /hour. There were 21 hypopneas with a hypopnea index of 4.3 /hour. The patient also had 0 respiratory event related arousals (RERAs).      The total APNEA/HYPOPNEA INDEX (AHI) was 4.3 /hour and the total RESPIRATORY DISTURBANCE INDEX was 4.3 /hour.  20 events occurred in REM sleep and 2 events in NREM. The REM AHI was 17.8 /hour, versus a non-REM AHI of .3. The patient spent 17 minutes of total sleep time in the supine position and 276 minutes in non-supine.. The supine AHI was 0.0 versus a non-supine AHI of 4.6.  OXYGEN SATURATION & C02:  The Wake baseline 02 saturation was 99%, with the lowest being 86%. Time spent below 89% saturation equaled 5 minutes.  PERIODIC LIMB MOVEMENTS: The patient had a total of 0 Periodic Limb Movements.  The Periodic Limb Movement (PLM) index was 0 and the PLM Arousal index was 0/hour.  Audio and video analysis did not show any abnormal or unusual movements, behaviors, phonations or vocalizations. The patient took 1 bathroom break. Mild snoring was noted. The EKG was in keeping with normal sinus rhythm (NSR).  Post-study, the patient indicated that sleep was the same as usual.   IMPRESSION:  1. Primary Snoring 2. Dysfunctions associated with sleep stages or arousal from sleep  RECOMMENDATIONS:  1. This study does not demonstrate any significant obstructive or central sleep disordered breathing. 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 For this, CPAP therapy is not warranted. Weight loss and avoidance of the supine sleep position will likely suffice as treatment. Mild snoring was noted. For disturbing snoring, an oral appliance (through a qualified dentist) can be considered. This study does not support an intrinsic sleep disorder as  a cause of the patient's symptoms. Other causes, including circadian rhythm disturbances, an underlying mood disorder, medication effect and/or an underlying medical problem cannot be ruled out. 2. The patient should be cautioned not to drive, work at heights, or operate dangerous or heavy equipment when tired or sleepy. Review and reiteration of good sleep hygiene measures should be pursued with any patient. 3. This study shows some sleep fragmentation and mildly abnormal sleep stage percentages; these are nonspecific findings and per se do not signify an intrinsic sleep disorder or a cause for the patient's sleep-related symptoms. Causes include (but are not limited to) the first night effect of the sleep study, circadian rhythm disturbances, medication effect or an underlying mood disorder or medical problem.  4. The patient will be advised to follow up with the referring provider, who will be notified of the test results.  I certify that I have reviewed the entire raw data recording prior to the issuance of this report in accordance with the Standards of Accreditation of the American Academy of Sleep Medicine (AASM)   Star Age, MD, PhD Diplomat, American Board of Neurology and Sleep Medicine (Neurology and Sleep Medicine)

## 2018-01-24 ENCOUNTER — Other Ambulatory Visit: Payer: Self-pay

## 2018-01-24 ENCOUNTER — Encounter: Payer: Self-pay | Admitting: Family Medicine

## 2018-01-24 ENCOUNTER — Ambulatory Visit: Payer: BLUE CROSS/BLUE SHIELD | Admitting: Family Medicine

## 2018-01-24 VITALS — BP 138/62 | HR 64 | Temp 98.7°F | Resp 16 | Ht 67.0 in | Wt 176.0 lb

## 2018-01-24 DIAGNOSIS — I10 Essential (primary) hypertension: Secondary | ICD-10-CM | POA: Diagnosis not present

## 2018-01-24 DIAGNOSIS — F419 Anxiety disorder, unspecified: Secondary | ICD-10-CM

## 2018-01-24 DIAGNOSIS — J329 Chronic sinusitis, unspecified: Secondary | ICD-10-CM | POA: Diagnosis not present

## 2018-01-24 DIAGNOSIS — Z09 Encounter for follow-up examination after completed treatment for conditions other than malignant neoplasm: Secondary | ICD-10-CM

## 2018-01-24 DIAGNOSIS — R32 Unspecified urinary incontinence: Secondary | ICD-10-CM

## 2018-01-24 DIAGNOSIS — E876 Hypokalemia: Secondary | ICD-10-CM

## 2018-01-24 LAB — COMPLETE METABOLIC PANEL WITH GFR
AG Ratio: 1.8 (calc) (ref 1.0–2.5)
ALT: 28 U/L (ref 6–29)
AST: 20 U/L (ref 10–35)
Albumin: 4.5 g/dL (ref 3.6–5.1)
Alkaline phosphatase (APISO): 92 U/L (ref 33–130)
BUN: 10 mg/dL (ref 7–25)
CO2: 28 mmol/L (ref 20–32)
Calcium: 10.1 mg/dL (ref 8.6–10.4)
Chloride: 102 mmol/L (ref 98–110)
Creat: 0.83 mg/dL (ref 0.50–0.99)
GFR, Est African American: 87 mL/min/{1.73_m2} (ref >=60)
GFR, Est Non African American: 75 mL/min/{1.73_m2} (ref >=60)
Globulin: 2.5 g/dL (calc) (ref 1.9–3.7)
Glucose, Bld: 83 mg/dL (ref 65–99)
Potassium: 3.8 mmol/L (ref 3.5–5.3)
Sodium: 141 mmol/L (ref 135–146)
Total Bilirubin: 0.8 mg/dL (ref 0.2–1.2)
Total Protein: 7 g/dL (ref 6.1–8.1)

## 2018-01-24 MED ORDER — AMLODIPINE BESYLATE 10 MG PO TABS: 10.0000 mg | ORAL_TABLET | Freq: Every day | ORAL | 3 refills | Status: DC

## 2018-01-24 MED ORDER — PREDNISONE 20 MG PO TABS: 40.0000 mg | ORAL_TABLET | Freq: Every day | ORAL | 0 refills | Status: AC

## 2018-01-24 MED ORDER — DOXYCYCLINE HYCLATE 100 MG PO TABS: 100.0000 mg | ORAL_TABLET | Freq: Two times a day (BID) | ORAL | 0 refills | Status: AC

## 2018-01-24 MED ORDER — ALPRAZOLAM 0.5 MG PO TABS: 0.5000 mg | ORAL_TABLET | Freq: Two times a day (BID) | ORAL | 0 refills | Status: DC | PRN

## 2018-01-24 MED ORDER — MONTELUKAST SODIUM 10 MG PO TABS: 10.0000 mg | ORAL_TABLET | Freq: Every day | ORAL | 3 refills | Status: DC

## 2018-01-24 MED ORDER — VALSARTAN 320 MG PO TABS: 320.0000 mg | ORAL_TABLET | ORAL | 3 refills | Status: DC

## 2018-01-24 MED ORDER — IPRATROPIUM BROMIDE 0.03 % NA SOLN: 2.0000 | Freq: Two times a day (BID) | NASAL | 12 refills | Status: DC

## 2018-01-24 NOTE — Patient Instructions (Signed)
For blood pressure - change when you take meds   In the morning take Vasartan and HCTZ  In the evening take metoprolol and amplodipine  Keep track of your blood pressures in the morning and again some other time of day  For your sinuses - they are very swollen and obstructed - add atrovent nasal spray to your flonase and zyrtec.  Also add montelukast to help control allergies.   Will do a short steroid prescription to try and shrink down the swelling. If you have any worsening sinus pain or pressure or new fever - start the doxycycline antibiotic.  Call us in 2 weeks with BP log

## 2018-01-24 NOTE — Progress Notes (Signed)
Patient ID: [name], female    DOB: 24-Nov-1955, 62 y.o.   MRN: [id number]  PCP: Susy Frizzle, MD  Chief Complaint  Patient presents with  . Blood Pressure    states that 2 weeks prior BP was elevated in AM (avg 180/70), but has since returned to normal    Subjective:   [name] is a 62 y.o. female, presents to clinic with CC of elevated BP readings x several weeks since ER visit 12/28/17.  She also has sinus congestion, urinary incontinence and anxiety and requests med refill of xanax.  She is an AAF with Hx of HTN with white coat htn, DM, HLD, AAA, carotid artery stenosis, strong family hx of DM and heart disease.  She is a fairly new pt to the practice with Dr. Dennard Schaumann as her PCP 12/09/17 first appt.  She presented to the ER 12/28/17 with CC of HTN, abd pain, HA, N/V and has a large work up including labs, CXR, EKG, head CT, CT renal stone study.  She reports to me today that she was having acute on chronic pain in her left shoulder blade, this elevated her BP and she took her meds prior to going to the ER and while there she "felt it coming down". She had some nausea, but denies abd pain, which was worked up.  Today she denies hx of HA's, but with ER visit is noted chronic migraines.  ER records, results, images were all reviewed and follow up on here today.   She had low potassium and reports compliance with supplement.  Her "fluid pill drains her of potassium".  She had urine culture result + for many organisms with suggestion of recollection. She had urinary frequency, nocturia and incontinence when asked about any urinary sx.  She denies dysuria, hematuria, urgency, bladder pressure, abd pain, flank pain.  She says "there is no pressure, it just leaks out of me all the time.  When I go to the bathroom it takes forever to empty my bladder." she notes this hx for at least 2 years, never been to GYN or urology for this.  Wears pads all the time.     Hypertension:  Concerned about elevated BP in the morning she can tell its high SBP 180-190's She wakes up feeling "really weird" with left eye pain with some temporary visual disturbances, she feels off balance, this last about 1.5 hours until morning BP meds start working.  Since ER visit this is pretty consistent in the mornings. IT has not been above SBP 200.  She denies CP, SOB, HA, palpitations, orthopnea, PND, LE edema, slurred speech, dizziness, balance issues, focal numbness/weakness/tingling, syncope, N, V. She gets up 3 times a night because of fluid pill, has difficulty getting back to sleep. She is taking metoprolol at night and other meds in the am During the day her BP is normal even runs low 90-130's.  BP Readings from Last 3 Encounters:  01/24/18 138/62  12/28/17 136/66  12/09/17 (!) 162/78   She was last seen for hypertension 6 weeks ago.  BP at that visit was 162/78. Management since that visit includes metroprolol, HCTZ, valsartan 320 and norvasc 10.  She only takes metoprolol at night all other in the am, she has started to wake up and take them earlier because of her sx. .She reports excellent compliance with treatment. She is having side effects. Occasionally has low BP 90-100/ 45-50 and that will make her feel tired (  hypertension sx above) She is exercising. She is adherent to low salt diet.   Outside blood pressures are SBP 90-190. Patient denies chest pain, chest pressure/discomfort, claudication, dyspnea, exertional chest pressure/discomfort, fatigue, lower extremity edema, near-syncope, orthopnea, palpitations, paroxysmal nocturnal dyspnea, syncope and tachypnea.   Cardiovascular risk factors include advanced age (older than 19 for men, 87 for women), diabetes mellitus, dyslipidemia, family history of premature cardiovascular disease and hypertension.  Use of agents associated with hypertension: none - only uses ASA as directed and tylenol for  pain.  Lipid/Cholesterol, Follow-up:   Last seen for this 6 weeks ago.  Management since that visit includes lipitor 80 mg - diet and exercise  Last Lipid Panel:    Component Value Date/Time   CHOL 181 12/09/2017 1100   TRIG 89 12/09/2017 1100   HDL 51 12/09/2017 1100   CHOLHDL 3.5 12/09/2017 1100   LDLCALC 111 (H) 12/09/2017 1100    She reports excellent compliance with treatment. She is not having side effects.  Wt Readings from Last 3 Encounters:  01/24/18 176 lb (79.8 kg)  12/09/17 176 lb (79.8 kg)  11/15/17 180 lb (81.6 kg)     Seasonal congestion - hx of some chronic rhinitis, states allergies have worsened and flonase is not improving. She has some pain to her nose and maxillary sinus.  Recent ER visit, 12/28/17 she has CT head which showed mucosal thickening in multiple ethmoid air cells ad opacification of several inferior mastoid air cells on the right.  Pt has not had fever, but chronic and worsening congestion, pain, pressure for many months.    Urinary incontinence since starting HCTZ, no bladder pressure, also takes a long time to empty bladder, up 3 x a night, no pain,   Pt requests refill of xanax.  She has not receieved this medicine from her PCP since establishing last month.  She reports taking it infrequently for anxiety, panic attacks or when she feels anxious.  She has xanax 1 mg on chart and verified in controlled substance database.  She notes taking 1/2 a pill to 1/4 pill a few times a month.   Last refill was March 2019.  Last prescriber:  Jake Samples, PA-C   30 pills appears to last her 1-3 months on average, when reviewing database for 2018 to current.  Her sx in the morning hours is triggering some anxiety for her, but she does not feel stressed, nervous, or overwhelmed every day.    Patient Active Problem List   Diagnosis Date Noted  . Controlled diabetes mellitus type 2 with complications ([place]) 54/62/7035  . Essential hypertension  04/25/2017  . Hyperlipidemia LDL goal <70 04/25/2017  . Carotid artery stenosis without cerebral infarction, bilateral 04/25/2017  . AAA (abdominal aortic aneurysm) without rupture ([place]) 04/25/2017  . History of colonic polyps   . Diverticulosis of colon without hemorrhage   . RUQ pain 02/21/2013  . Abdominal pain, epigastric 01/05/2013  . Abdominal bruit 01/05/2013  . Esophageal dysphagia 01/05/2013  . Unspecified constipation 01/05/2013  . GERD (gastroesophageal reflux disease) 01/05/2013     Prior to Admission medications   Medication Sig Start Date End Date Taking? Authorizing Provider  ALPRAZolam Duanne Moron) 1 MG tablet Take 1 mg by mouth daily as needed for anxiety.  12/27/12  Yes [provider]  amLODipine-valsartan (EXFORGE) 10-320 MG tablet Take 1 tablet by mouth daily. 12/28/17  Yes Susy Frizzle, MD  aspirin EC 81 MG tablet Take 81 mg by mouth every morning.  Yes [provider]  atorvastatin (LIPITOR) 80 MG tablet Take 1 tablet (80 mg total) by mouth daily. 12/14/17  Yes Susy Frizzle, MD  augmented betamethasone dipropionate (DIPROLENE-AF) 0.05 % cream Apply 1 application topically daily as needed. 12/15/17  Yes [provider]  cetirizine (ZYRTEC) 10 MG tablet Take 10 mg by mouth daily.   Yes [provider]  esomeprazole (NEXIUM) 40 MG capsule Take 40 mg by mouth daily.    Yes [provider]  fluticasone (FLONASE) 50 MCG/ACT nasal spray Place 2 sprays into both nostrils daily. 07/26/17  Yes Hagler, Apolonio Schneiders, MD  hydrochlorothiazide (HYDRODIURIL) 25 MG tablet Take 1 tablet (25 mg total) daily by mouth. 03/14/17  Yes Hagler, Apolonio Schneiders, MD  metFORMIN (GLUCOPHAGE) 500 MG tablet TAKE 1 TABLET(500 MG) BY MOUTH TWICE DAILY WITH A MEAL Patient taking differently: Take 500 mg by mouth 2 (two) times daily with a meal.  09/26/17  Yes Hagler, Apolonio Schneiders, MD  metoprolol succinate (TOPROL-XL) 100 MG 24 hr tablet Take 1 tablet (100 mg total) by mouth  daily. Take with or immediately following a meal. 04/25/17  Yes Hagler, Apolonio Schneiders, MD  oxymetazoline (NASAL SPRAY EXTRA MOISTURIZING) 0.05 % nasal spray Place 1 spray into both nostrils 2 (two) times daily as needed for congestion.   Yes [provider]  potassium chloride SA (K-DUR,KLOR-CON) 20 MEQ tablet Take 20 mEq by mouth daily.   Yes [provider]  vitamin B-12 (CYANOCOBALAMIN) 1000 MCG tablet Take 1,000 mcg by mouth daily.   Yes [provider]  vitamin C (ASCORBIC ACID) 500 MG tablet Take 1,000 mg by mouth daily.   Yes [provider]     Allergies  Allergen Reactions  . Ciprofloxacin Shortness Of Breath     Family History  Problem Relation Age of Onset  . Diabetes Other   . Diabetes Mother   . Heart disease Mother   . Heart disease Father   . Diabetes Sister   . Diabetes Brother   . Hypertension Brother   . Diabetes Daughter   . Hypertension Maternal Grandmother   . Stroke Maternal Grandfather   . Early death Paternal Grandfather   . Colon cancer Neg Hx   . Liver disease Neg Hx      Social History   Socioeconomic History  . Marital status: Single    Spouse name: Not on file  . Number of children: 3  . Years of education: Not on file  . Highest education level: Not on file  Occupational History  . 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 Financial resource strain: Not on file  . Food insecurity:    Worry: Not on file    Inability: Not on file  . Transportation needs:    Medical: Not on file    Non-medical: Not on file  Tobacco Use  . Smoking status: Former Smoker    Packs/day: 0.25    Years: 35.00    Pack years: 8.75    Types: Cigarettes    Last attempt to quit: 12/31/2012    Years since quitting: 5.0  . Smokeless tobacco: Never Used  Substance and Sexual Activity  . Alcohol use: Yes    Comment: occasionaly  . Drug use: No  . Sexual activity: Yes    Partners: Male    Birth  control/protection: Post-menopausal  Lifestyle  . Physical activity:    Days per week: Not on file    Minutes per session:  Not on file  . Stress: Not on file  Relationships  . Social connections:    Talks on phone: Not on file    Gets together: Not on file    Attends religious service: Not on file    Active member of club or organization: Not on file    Attends meetings of clubs or organizations: Not on file    Relationship status: Not on file  . Intimate partner violence:    Fear of current or ex partner: Not on file    Emotionally abused: Not on file    Physically abused: Not on file    Forced sexual activity: Not on file  Other Topics Concern  . Not on file  Social History Narrative   Eats all food groups.    Wears seatbelt.    Has 3 children.   Prior smoker.   Attends church.    Used to work in [employer].    Drives.   Enjoys going out with family.      Review of Systems  Constitutional: Negative.  Negative for activity change, appetite change, chills, diaphoresis, fatigue, fever and unexpected weight change.  HENT: Negative for dental problem, ear discharge, ear pain, facial swelling, hearing loss, nosebleeds, sore throat, tinnitus, trouble swallowing and voice change.   Eyes: Negative.  Negative for photophobia and redness.  Respiratory: Negative.  Negative for cough, chest tightness, shortness of breath, wheezing and stridor.   Cardiovascular: Negative.  Negative for chest pain, palpitations and leg swelling.  Gastrointestinal: Negative.  Negative for abdominal pain, constipation, diarrhea, nausea and vomiting.  Endocrine: Negative.  Negative for polydipsia.  Genitourinary: Negative.  Negative for enuresis, flank pain, genital sores, hematuria and pelvic pain.  Musculoskeletal: Negative.   Skin: Negative.  Negative for color change, pallor, rash and wound.  Allergic/Immunologic: Positive for environmental allergies. Negative for immunocompromised state.   Neurological: Negative for dizziness, tremors, seizures, syncope, facial asymmetry, speech difficulty, weakness, light-headedness, numbness and headaches.  Hematological: Negative.   Psychiatric/Behavioral: Negative.  Negative for confusion.  10 Systems reviewed and are negative for acute change except as noted in the HPI.      Objective:    Vitals:   01/24/18 1208  BP: 138/62  Pulse: 64  Resp: 16  Temp: 98.7 F (37.1 C)  TempSrc: Oral  SpO2: 97%  Weight: 176 lb (79.8 kg)  Height: 5\' 7"  (1.702 m)      Physical Exam  Constitutional: She is oriented to person, place, and time. She appears well-developed and well-nourished. She is cooperative.  Non-toxic appearance. She does not have a sickly appearance. She does not appear ill. No distress.  HENT:  Head: Normocephalic and atraumatic.  Right Ear: Hearing, tympanic membrane, external ear and ear canal normal.  Left Ear: Hearing, tympanic membrane, external ear and ear canal normal.  Nose: Mucosal edema present. No nasal deformity.  Mouth/Throat: Uvula is midline, oropharynx is clear and moist and mucous membranes are normal. Mucous membranes are not pale, not dry and not cyanotic. No oropharyngeal exudate, posterior oropharyngeal edema, posterior oropharyngeal erythema or tonsillar abscesses. No tonsillar exudate.  B/l nares obstructed with severly enlarged b/l nasal turbinates pale pink Mild ttp to nose and medial maxillary sinus, no frontal sinus ttp  Eyes: Pupils are equal, round, and reactive to light. Conjunctivae, EOM and lids are normal. Right eye exhibits no chemosis and no discharge. Left eye exhibits no chemosis and no discharge. Right conjunctiva is not injected. Right conjunctiva has no hemorrhage. Left  conjunctiva is not injected. Left conjunctiva has no hemorrhage. Right eye exhibits normal extraocular motion and no nystagmus. Left eye exhibits normal extraocular motion and no nystagmus. Right pupil is round and  reactive. Left pupil is round and reactive. Pupils are equal.  Neck: Trachea normal, normal range of motion, full passive range of motion without pain and phonation normal. Neck supple. No JVD present. Carotid bruit is not present. No tracheal deviation present. No thyromegaly present.  Cardiovascular: Normal rate, regular rhythm, normal heart sounds and normal pulses. PMI is not displaced. Exam reveals no gallop and no friction rub.  No murmur heard. Pulses:      Radial pulses are 2+ on the right side, and 2+ on the left side.       Posterior tibial pulses are 2+ on the right side, and 2+ on the left side.  No LE edema  Pulmonary/Chest: Effort normal and breath sounds normal. No accessory muscle usage or stridor. No tachypnea. No respiratory distress. She has no decreased breath sounds. She has no wheezes. She has no rhonchi. She has no rales. She exhibits no tenderness.  Abdominal: Soft. Normal appearance and bowel sounds are normal. She exhibits no distension. There is no tenderness.  Musculoskeletal: Normal range of motion. She exhibits no edema or deformity.  Lymphadenopathy:    She has no cervical adenopathy.  Neurological: She is alert and oriented to person, place, and time. She has normal strength. She displays no tremor. No cranial nerve deficit. She exhibits normal muscle tone. Coordination and gait normal.  Skin: Skin is warm, dry and intact. Capillary refill takes less than 2 seconds. No rash noted. She is not diaphoretic. No cyanosis. No pallor. Nails show no clubbing.  Psychiatric: She has a normal mood and affect. Her speech is normal and behavior is normal. Judgment and thought content normal. Her mood appears not anxious. She is not actively hallucinating. Cognition and memory are normal. She does not exhibit a depressed mood. She is attentive.  Nursing note and vitals reviewed.         Assessment & Plan:    1.  Essential HTN:    Pt presents for concerns of elevated BP in  am's sx, improves throughout the day, here near goal. Currently BP not elevated and pt not sx, do not feel the need to work up hypertensive urgency, but will repeat CMP.  With her current hx and exam I do not suspect end organ damage, but pt is high risk for heart attack or stroke and has noted arthrosclerotic disease to carotids and AAA, would like to protect her from BP > 180 that could possibly precipitate major adverse event.   Pt additionally notes some soft BP that she feels weak from, so feel limited on safe ways to tx morning elevated BP.   Tx by separating meds to take norvasc at night with Metoprolol, and valsartan and HCTZ in the am.  Monitor meds, times and sx for 2 weeks.   Earlier appt if BP routinely elevated > SBP 160.    Recheck renal function EKG from 12/28/17 reviewed CXR from 12/28/17 reviewed Do not feel like we need to repeat these today  2.  Mild hypokalemia with recent labs from ER - recheck today   3. Acute on chronic sinusitis Hx of allergies, rhinitis is severe on exam, appears allergic - increase allergy control.  Sinusitis, pt with sx gradually worsening for weeks, CT findings show disease from almost a month ago. Unsure if  this would be contributing to BP or eye pain sx at all. She is having difficulty sleeping laying down at night only because of congestion. Increase allergy control meds, add steroid burst, doxy 100 mg BID x 7 days if not improved.  May need ENT referral with extent of turbinate enlargement.  She will f/up in 1-2 months with 3 allergy meds.  4. Urinary incontinence, unspecified She denies any sx concerning for UTI.  Will not recollect UA/urine culture Sx for ~ 2 years - refer to urology  5.  Anxiety - hx verified in chart and meds verified in controlled stubstance database.  Gave lower dose.  Will need to f/up with PCP for refills, since they have not discusses this before. No SI, HI, AVH, pt very appropriate.      Kimberly Grana, PA-C 01/24/18 12:21  PM

## 2018-02-01 ENCOUNTER — Other Ambulatory Visit: Payer: Self-pay | Admitting: Family Medicine

## 2018-02-01 DIAGNOSIS — E119 Type 2 diabetes mellitus without complications: Secondary | ICD-10-CM

## 2018-02-06 ENCOUNTER — Other Ambulatory Visit: Payer: Self-pay | Admitting: Family Medicine

## 2018-02-06 DIAGNOSIS — E119 Type 2 diabetes mellitus without complications: Secondary | ICD-10-CM

## 2018-02-06 MED ORDER — METFORMIN HCL 500 MG PO TABS: ORAL_TABLET | ORAL | 1 refills | Status: DC

## 2018-02-14 ENCOUNTER — Other Ambulatory Visit: Payer: Self-pay | Admitting: Family Medicine

## 2018-02-14 MED ORDER — ESOMEPRAZOLE MAGNESIUM 40 MG PO CPDR: 40.0000 mg | DELAYED_RELEASE_CAPSULE | Freq: Every day | ORAL | 3 refills | Status: DC

## 2018-02-23 ENCOUNTER — Ambulatory Visit (INDEPENDENT_AMBULATORY_CARE_PROVIDER_SITE_OTHER): Payer: BLUE CROSS/BLUE SHIELD

## 2018-02-23 DIAGNOSIS — Z23 Encounter for immunization: Secondary | ICD-10-CM

## 2018-02-23 NOTE — Progress Notes (Signed)
Patient was in office for flu vaccine.Patient received vaccine in her left deltoid Patient tolerated well

## 2018-02-24 ENCOUNTER — Ambulatory Visit: Payer: BLUE CROSS/BLUE SHIELD

## 2018-02-24 ENCOUNTER — Telehealth: Payer: Self-pay | Admitting: Family Medicine

## 2018-02-24 NOTE — Telephone Encounter (Signed)
Patient dropped off Blood pressure records for Dr.Pickard to review. He reviewed records and stated that most values are excellent. No changes needed. Called patient and had to leave a voicemail to inform her

## 2018-03-02 ENCOUNTER — Other Ambulatory Visit: Payer: Self-pay | Admitting: Family Medicine

## 2018-03-02 DIAGNOSIS — K5904 Chronic idiopathic constipation: Secondary | ICD-10-CM

## 2018-03-03 ENCOUNTER — Other Ambulatory Visit: Payer: Self-pay | Admitting: Family Medicine

## 2018-03-03 DIAGNOSIS — I10 Essential (primary) hypertension: Secondary | ICD-10-CM

## 2018-03-03 MED ORDER — METOPROLOL SUCCINATE ER 100 MG PO TB24: 100.0000 mg | ORAL_TABLET | Freq: Every day | ORAL | 3 refills | Status: DC

## 2018-03-13 ENCOUNTER — Other Ambulatory Visit: Payer: Self-pay | Admitting: Family Medicine

## 2018-03-13 DIAGNOSIS — I10 Essential (primary) hypertension: Secondary | ICD-10-CM

## 2018-03-16 ENCOUNTER — Telehealth: Payer: Self-pay | Admitting: Family Medicine

## 2018-03-16 NOTE — Telephone Encounter (Signed)
Patient called in stating that she had called in a refill on Linzess that was last prescribed by Dr. Welton Flakes 2 weeks ago and she never received a response back. Patient would like to know if we could prescribe her a medication similar to [place] but more cost effective. Please advise?

## 2018-03-16 NOTE — Telephone Encounter (Signed)
I am not sure what her insurance will cover.  I will be glad to prescribe Linzess 145 mcg daily or Amitiza 24 mcg twice daily.  These are the only 2 prescription options.  Otherwise she could use MiraLAX over-the-counter which may be cheaper.

## 2018-03-17 MED ORDER — LINACLOTIDE 145 MCG PO CAPS: 145.0000 ug | ORAL_CAPSULE | Freq: Every day | ORAL | 3 refills | Status: DC

## 2018-03-17 NOTE — Telephone Encounter (Signed)
Call placed to patient and patient made aware.   States that she was on samples of Linzess and it was effective. Would like to try that first to see if insurance will cover.   Prescription sent to pharmacy.

## 2018-03-23 ENCOUNTER — Other Ambulatory Visit: Payer: Self-pay | Admitting: Family Medicine

## 2018-03-23 DIAGNOSIS — I10 Essential (primary) hypertension: Secondary | ICD-10-CM

## 2018-03-30 ENCOUNTER — Ambulatory Visit: Payer: BLUE CROSS/BLUE SHIELD | Admitting: Family Medicine

## 2018-03-30 ENCOUNTER — Encounter: Payer: Self-pay | Admitting: Family Medicine

## 2018-03-30 VITALS — BP 130/70 | HR 60 | Temp 98.4°F | Resp 14 | Ht 67.0 in | Wt 177.0 lb

## 2018-03-30 DIAGNOSIS — M25562 Pain in left knee: Secondary | ICD-10-CM | POA: Diagnosis not present

## 2018-03-30 DIAGNOSIS — M5410 Radiculopathy, site unspecified: Secondary | ICD-10-CM | POA: Diagnosis not present

## 2018-03-30 MED ORDER — PREDNISONE 20 MG PO TABS: ORAL_TABLET | ORAL | 0 refills | Status: DC

## 2018-03-30 MED ORDER — DICLOFENAC SODIUM 75 MG PO TBEC: 75.0000 mg | DELAYED_RELEASE_TABLET | Freq: Two times a day (BID) | ORAL | 0 refills | Status: DC

## 2018-03-30 NOTE — Progress Notes (Signed)
Subjective:    Patient ID: [name], female    DOB: 1955/10/30, 62 y.o.   MRN: [id number]  HPI Patient presents today with 3 months of left knee pain however it is acutely worsened over the last month.  She believes the pain started after she tripped walking backwards this summer.  However she is also having pain radiating into her posterior left hip.  The pain is located near the sciatic notch.  She will also have pain radiate up and down her leg.  The pain is worse behind the knee towards the lateral aspect however it will occasionally radiate beyond the knee down her left foot crossover to the anterior ankle and into her foot.  On examination today she has no tenderness to palpation over the medial or lateral joint lines.  She has no laxity to varus or valgus stress or pain with varus or valgus stress.  She has a negative anterior and posterior drawer sign.  She has no pain with flexion up to 90 degrees.  At 90 degrees, Apley grind test is positive for pain in the lateral joint line.  This is suspicious for a meniscal tear. Past Medical History:  Diagnosis Date  . Abdominal aortic aneurysm (AAA) 3.0 cm to 5.0 cm in diameter in female [facility])    None seen on prior imaging?   Marland Kitchen Anxiety   . Cataract   . Chronic left shoulder pain   . Headache   . Hiatal hernia    small  . Hyperlipidemia   . Hypertension   . Pre-diabetes   . Prediabetes   . Schatzki's ring   . Tremor    Past Surgical History:  Procedure Laterality Date  . CARDIAC CATHETERIZATION  2005   "[name] has essentially normal coronary arteries and normal left ventricular function.  She will be treated empirically with anti-reflux measures"  . CATARACT EXTRACTION, BILATERAL    . CHOLECYSTECTOMY    . COLONOSCOPY    06/16/2004   IRS:WNIOEV rectum/Diminutive polyps of the splenic flexure and the cecum/The remainder of the colonic mucosa appeared normal pathology (hyperplastic polyps).  . COLONOSCOPY N/A 08/07/2015   Dr. Gala Romney: 6 mm descending colon tubular adenoma, multiple medium-mouthed diverticula in sigmoid colon. surveillance 2022  . ESOPHAGOGASTRODUODENOSCOPY  09/11/2001   OJJ:[id number] ring otherwise normal esophagus/Normal stomach/Normal D1 and D2 status post passage of a 56 [place] Maloney dilator  . ESOPHAGOGASTRODUODENOSCOPY N/A 11/11/2017   Procedure: ESOPHAGOGASTRODUODENOSCOPY (EGD);  Surgeon: Daneil Dolin, MD;  [facility];  Service: Endoscopy;  Laterality: N/A;  8:15am  . ESOPHAGOGASTRODUODENOSCOPY (EGD) WITH ESOPHAGEAL DILATION N/A 01/25/2013   Dr. Gala Romney- schatzki's ring- 64 F dilation. small hiatal hernia  . MALONEY DILATION N/A 11/11/2017   Procedure: [name] DILATION;  Surgeon: Daneil Dolin, MD;  [facility];  Service: Endoscopy;  Laterality: N/A;  . TUBAL LIGATION     Current Outpatient Medications on File Prior to Visit  Medication Sig Dispense Refill  . ALPRAZolam (XANAX) 0.5 MG tablet Take 1 tablet (0.5 mg total) by mouth 2 (two) times daily as needed for anxiety. 60 tablet 0  . amLODipine (NORVASC) 10 MG tablet Take 1 tablet (10 mg total) by mouth at bedtime. 90 tablet 3  . aspirin EC 81 MG tablet Take 81 mg by mouth every morning.     Marland Kitchen atorvastatin (LIPITOR) 80 MG tablet Take 1 tablet (80 mg total) by mouth daily. 90 tablet 3  . augmented betamethasone dipropionate (DIPROLENE-AF) 0.05 %  cream Apply 1 application topically daily as needed.  3  . cetirizine (ZYRTEC) 10 MG tablet Take 10 mg by mouth daily.    Marland Kitchen esomeprazole (NEXIUM) 40 MG capsule Take 1 capsule (40 mg total) by mouth daily. 90 capsule 3  . fluticasone (FLONASE) 50 MCG/ACT nasal spray Place 2 sprays into both nostrils daily. 16 g 6  . hydrochlorothiazide (HYDRODIURIL) 25 MG tablet TAKE 1 TABLET(25 MG) BY MOUTH DAILY 90 tablet 0  . ipratropium (ATROVENT) 0.03 % nasal spray Place 2 sprays into both nostrils every 12 (twelve) hours. 30 mL 12  . linaclotide (LINZESS) 145 MCG CAPS capsule Take 1  capsule (145 mcg total) by mouth daily before breakfast. 30 capsule 3  . metFORMIN (GLUCOPHAGE) 500 MG tablet TAKE 1 TABLET(500 MG) BY MOUTH TWICE DAILY WITH A MEAL 180 tablet 1  . metoprolol succinate (TOPROL-XL) 100 MG 24 hr tablet Take 1 tablet (100 mg total) by mouth daily. Take with or immediately following a meal. 90 tablet 3  . montelukast (SINGULAIR) 10 MG tablet Take 1 tablet (10 mg total) by mouth at bedtime. 30 tablet 3  . oxymetazoline (NASAL SPRAY EXTRA MOISTURIZING) 0.05 % nasal spray Place 1 spray into both nostrils 2 (two) times daily as needed for congestion.    . potassium chloride SA (K-DUR,KLOR-CON) 20 MEQ tablet Take 20 mEq by mouth daily.    . valsartan (DIOVAN) 320 MG tablet Take 1 tablet (320 mg total) by mouth every morning. 90 tablet 3  . vitamin B-12 (CYANOCOBALAMIN) 1000 MCG tablet Take 1,000 mcg by mouth daily.    . vitamin C (ASCORBIC ACID) 500 MG tablet Take 1,000 mg by mouth daily.     No current facility-administered medications on file prior to visit.    Allergies  Allergen Reactions  . Ciprofloxacin Shortness Of Breath   Social History   Socioeconomic History  . Marital status: Single    Spouse name: Not on file  . Number of children: 3  . Years of education: Not on file  . Highest education level: Not on file  Occupational History  . 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 Financial resource strain: Not on file  . Food insecurity:    Worry: Not on file    Inability: Not on file  . Transportation needs:    Medical: Not on file    Non-medical: Not on file  Tobacco Use  . Smoking status: Former Smoker    Packs/day: 0.25    Years: 35.00    Pack years: 8.75    Types: Cigarettes    Last attempt to quit: 12/31/2012    Years since quitting: 5.2  . Smokeless tobacco: Never Used  Substance and Sexual Activity  . Alcohol use: Yes    Comment: occasionaly  . Drug use: No  . Sexual activity: Yes    Partners: Male      Birth control/protection: Post-menopausal  Lifestyle  . Physical activity:    Days per week: Not on file    Minutes per session: Not on file  . Stress: Not on file  Relationships  . Social connections:    Talks on phone: Not on file    Gets together: Not on file    Attends religious service: Not on file    Active member of club or organization: Not on file    Attends meetings of clubs or organizations: Not on file    Relationship status: Not on file  .  Intimate partner violence:    Fear of current or ex partner: Not on file    Emotionally abused: Not on file    Physically abused: Not on file    Forced sexual activity: Not on file  Other Topics Concern  . Not on file  Social History Narrative   Eats all food groups.    Wears seatbelt.    Has 3 children.   Prior smoker.   Attends church.    Used to work in [employer] fund[employer].    Drives.   Enjoys going out with family.    Family History  Problem Relation Age of Onset  . Diabetes Other   . Diabetes Mother   . Heart disease Mother   . Heart disease Father   . Diabetes Sister   . Diabetes Brother   . Hypertension Brother   . Diabetes Daughter   . Hypertension Maternal Grandmother   . Stroke Maternal Grandfather   . Early death Paternal Grandfather   . Colon cancer Neg Hx   . Liver disease Neg Hx       Review of Systems  All other systems reviewed and are negative.      Objective:   Physical Exam  Constitutional: She appears well-developed and well-nourished.  Cardiovascular: Normal rate, regular rhythm, normal heart sounds and intact distal pulses. Exam reveals no gallop and no friction rub.  No murmur heard. Pulmonary/Chest: Effort normal and breath sounds normal. No stridor. No respiratory distress. She has no wheezes. She has no rales. She exhibits no tenderness.  Musculoskeletal:       Left knee: She exhibits decreased range of motion and abnormal meniscus. She exhibits no LCL laxity, no bony tenderness and no  MCL laxity. No medial joint line, no lateral joint line, no MCL, no LCL and no patellar tendon tenderness noted.  Neurological: She displays normal reflexes. No sensory deficit. She exhibits normal muscle tone. Coordination normal.  Skin: Rash noted. There is erythema.  Vitals reviewed.         Assessment & Plan:  Posterior left knee pain  Radiculopathy of leg  Her exam and the majority of her symptoms support a left lateral meniscal tear.  However she is also having neuropathic radicular symptoms radiating from her posterior left hip down her left leg into her left foot.  I explained to the patient that this would not be associated with a meniscal tear.  It is possible she has 2 problems.  We discussed options including a cortisone injection in the knee to see if her symptoms would improve, a low-dose anti-inflammatory medicine, a prednisone taper pack.  I recommend trying a prednisone taper pack.  I am doing this primarily to see if the radicular symptoms will improve.  If all of her symptoms improve, it would point towards lumbar radiculopathy as a possible cause.  If the neuropathic symptoms improve but she continues to have the severe left knee pain, she can return for cortisone injection in her left knee for a possible meniscal tear.  After she completes the prednisone, I recommended starting diclofenac 75 mg p.o. twice daily to try to alleviate her symptoms further.  I believe this would help with degenerative disc disease causing lumbar radiculopathy as well as a meniscal tear.

## 2018-05-08 ENCOUNTER — Other Ambulatory Visit: Payer: Self-pay | Admitting: Family Medicine

## 2018-05-08 DIAGNOSIS — F419 Anxiety disorder, unspecified: Secondary | ICD-10-CM

## 2018-05-09 NOTE — Telephone Encounter (Signed)
Ok to refill??  Last office visit 03/30/2018.  Last refill 01/24/2018.

## 2018-05-25 ENCOUNTER — Encounter: Payer: Self-pay | Admitting: Orthopedic Surgery

## 2018-05-26 ENCOUNTER — Encounter: Payer: Self-pay | Admitting: Family Medicine

## 2018-05-26 ENCOUNTER — Ambulatory Visit (INDEPENDENT_AMBULATORY_CARE_PROVIDER_SITE_OTHER): Payer: BLUE CROSS/BLUE SHIELD | Admitting: Family Medicine

## 2018-05-26 VITALS — BP 120/60 | HR 70 | Temp 98.6°F | Resp 12 | Ht 67.0 in | Wt 175.0 lb

## 2018-05-26 DIAGNOSIS — M25562 Pain in left knee: Secondary | ICD-10-CM | POA: Diagnosis not present

## 2018-05-26 DIAGNOSIS — G8929 Other chronic pain: Secondary | ICD-10-CM

## 2018-05-26 NOTE — Progress Notes (Signed)
Subjective:    Patient ID: [name], female    DOB: 1955-09-11, 63 y.o.   MRN: [id number]  HPI  03/30/18 Patient presents today with 3 months of left knee pain however it is acutely worsened over the last month.  She believes the pain started after she tripped walking backwards this summer.  However she is also having pain radiating into her posterior left hip.  The pain is located near the sciatic notch.  She will also have pain radiate up and down her leg.  The pain is worse behind the knee towards the lateral aspect however it will occasionally radiate beyond the knee down her left foot crossover to the anterior ankle and into her foot.  On examination today she has no tenderness to palpation over the medial or lateral joint lines.  She has no laxity to varus or valgus stress or pain with varus or valgus stress.  She has a negative anterior and posterior drawer sign.  She has no pain with flexion up to 90 degrees.  At 90 degrees, Apley grind test is positive for pain in the lateral joint line.  This is suspicious for a meniscal tear.  At that time, my plan was: Her exam and the majority of her symptoms support a left lateral meniscal tear.  However she is also having neuropathic radicular symptoms radiating from her posterior left hip down her left leg into her left foot.  I explained to the patient that this would not be associated with a meniscal tear.  It is possible she has 2 problems.  We discussed options including a cortisone injection in the knee to see if her symptoms would improve, a low-dose anti-inflammatory medicine, a prednisone taper pack.  I recommend trying a prednisone taper pack.  I am doing this primarily to see if the radicular symptoms will improve.  If all of her symptoms improve, it would point towards lumbar radiculopathy as a possible cause.  If the neuropathic symptoms improve but she continues to have the severe left knee pain, she can return for cortisone injection  in her left knee for a possible meniscal tear.  After she completes the prednisone, I recommended starting diclofenac 75 mg p.o. twice daily to try to alleviate her symptoms further.  I believe this would help with degenerative disc disease causing lumbar radiculopathy as well as a meniscal tear.  05/26/18 Patient presents today again complaining of left knee pain.  The prednisone helped tremendously.  She states the pain stayed away for approximately 2 months however now she is again complaining of pain primarily over the lateral compartment of her left knee.  It aches in a bandlike pattern around her knee particularly with prolonged sitting like driving in a car.  Occasionally will radiate down into her anterior left shin.  She denies any radiation up her leg or in her gluteus area or low back pain.  The pain is primarily in the knee and it also causes pain with ambulation and with range of motion in the knee.  She is here today for cortisone injection in the knee.  She also has noticed a small lump in her neck.  It is located over the left trapezius muscle just below the mastoid process.  It is approximately 0.75 cm in diameter.  It appears to be a small lymph node.  It is only slightly tender to touch.  She is had a recent sinus infection.  It could be a slightly reactive lymph  node.  There is no other lymphadenopathy in the neck.  She denies any fevers or chills.  I have recommended that we just monitor this for the present time.  If it grows or changes in any way we could image it or biopsy the lymph node but it appears to be a normal size lymph node Past Medical History:  Diagnosis Date  . Abdominal aortic aneurysm (AAA) 3.0 cm to 5.0 cm in diameter in female [facility])    None seen on prior imaging?   Marland Kitchen Anxiety   . Cataract   . Chronic left shoulder pain   . Headache   . Hiatal hernia    small  . Hyperlipidemia   . Hypertension   . Pre-diabetes   . Prediabetes   . Schatzki's ring   . Tremor      Past Surgical History:  Procedure Laterality Date  . CARDIAC CATHETERIZATION  2005   "Ms. Mccard has essentially normal coronary arteries and normal left ventricular function.  She will be treated empirically with anti-reflux measures"  . CATARACT EXTRACTION, BILATERAL    . CHOLECYSTECTOMY    . COLONOSCOPY    06/16/2004   DVV:[id number] rectum/Diminutive polyps of the splenic flexure and the cecum/The remainder of the colonic mucosa appeared normal pathology (hyperplastic polyps).  . COLONOSCOPY N/A 08/07/2015   Dr. Gala Romney: 6 mm descending colon tubular adenoma, multiple medium-mouthed diverticula in sigmoid colon. surveillance 2022  . ESOPHAGOGASTRODUODENOSCOPY  09/11/2001   XTG:[id number] ring otherwise normal esophagus/Normal stomach/Normal D1 and D2 status post passage of a 56 [place] Maloney dilator  . ESOPHAGOGASTRODUODENOSCOPY N/A 11/11/2017   Procedure: ESOPHAGOGASTRODUODENOSCOPY (EGD);  Surgeon: Daneil Dolin, MD;  [facility];  Service: Endoscopy;  Laterality: N/A;  8:15am  . ESOPHAGOGASTRODUODENOSCOPY (EGD) WITH ESOPHAGEAL DILATION N/A 01/25/2013   Dr. Gala Romney- schatzki's ring- 37 F dilation. small hiatal hernia  . MALONEY DILATION N/A 11/11/2017   Procedure: [name] DILATION;  Surgeon: Daneil Dolin, MD;  [facility];  Service: Endoscopy;  Laterality: N/A;  . TUBAL LIGATION     Current Outpatient Medications on File Prior to Visit  Medication Sig Dispense Refill  . ALPRAZolam (XANAX) 0.5 MG tablet TAKE 1 TABLET(0.5 MG) BY MOUTH TWICE DAILY AS NEEDED FOR ANXIETY 60 tablet 0  . amLODipine (NORVASC) 10 MG tablet Take 1 tablet (10 mg total) by mouth at bedtime. 90 tablet 3  . aspirin EC 81 MG tablet Take 81 mg by mouth every morning.     Marland Kitchen atorvastatin (LIPITOR) 80 MG tablet Take 1 tablet (80 mg total) by mouth daily. 90 tablet 3  . augmented betamethasone dipropionate (DIPROLENE-AF) 0.05 % cream Apply 1 application topically daily as needed.  3  . cetirizine  (ZYRTEC) 10 MG tablet Take 10 mg by mouth daily.    . diclofenac (VOLTAREN) 75 MG EC tablet Take 1 tablet (75 mg total) by mouth 2 (two) times daily. 30 tablet 0  . esomeprazole (NEXIUM) 40 MG capsule Take 1 capsule (40 mg total) by mouth daily. 90 capsule 3  . fluticasone (FLONASE) 50 MCG/ACT nasal spray Place 2 sprays into both nostrils daily. 16 g 6  . hydrochlorothiazide (HYDRODIURIL) 25 MG tablet TAKE 1 TABLET(25 MG) BY MOUTH DAILY 90 tablet 0  . ipratropium (ATROVENT) 0.03 % nasal spray Place 2 sprays into both nostrils every 12 (twelve) hours. 30 mL 12  . linaclotide (LINZESS) 145 MCG CAPS capsule Take 1 capsule (145 mcg total) by mouth daily before breakfast. 30 capsule  3  . metFORMIN (GLUCOPHAGE) 500 MG tablet TAKE 1 TABLET(500 MG) BY MOUTH TWICE DAILY WITH A MEAL 180 tablet 1  . metoprolol succinate (TOPROL-XL) 100 MG 24 hr tablet Take 1 tablet (100 mg total) by mouth daily. Take with or immediately following a meal. 90 tablet 3  . montelukast (SINGULAIR) 10 MG tablet Take 1 tablet (10 mg total) by mouth at bedtime. 30 tablet 3  . potassium chloride SA (K-DUR,KLOR-CON) 20 MEQ tablet Take 20 mEq by mouth daily.    . valsartan (DIOVAN) 320 MG tablet Take 1 tablet (320 mg total) by mouth every morning. 90 tablet 3  . vitamin B-12 (CYANOCOBALAMIN) 1000 MCG tablet Take 1,000 mcg by mouth daily.    . vitamin C (ASCORBIC ACID) 500 MG tablet Take 1,000 mg by mouth daily.     No current facility-administered medications on file prior to visit.    Allergies  Allergen Reactions  . Ciprofloxacin Shortness Of Breath   Social History   Socioeconomic History  . Marital status: Single    Spouse name: Not on file  . Number of children: 3  . Years of education: Not on file  . Highest education level: Not on file  Occupational History  . 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 Financial resource strain: Not on file  . Food insecurity:    Worry: Not on file      Inability: Not on file  . Transportation needs:    Medical: Not on file    Non-medical: Not on file  Tobacco Use  . Smoking status: Former Smoker    Packs/day: 0.25    Years: 35.00    Pack years: 8.75    Types: Cigarettes    Last attempt to quit: 12/31/2012    Years since quitting: 5.4  . Smokeless tobacco: Never Used  Substance and Sexual Activity  . Alcohol use: Yes    Comment: occasionaly  . Drug use: No  . Sexual activity: Yes    Partners: Male    Birth control/protection: Post-menopausal  Lifestyle  . Physical activity:    Days per week: Not on file    Minutes per session: Not on file  . Stress: Not on file  Relationships  . Social connections:    Talks on phone: Not on file    Gets together: Not on file    Attends religious service: Not on file    Active member of club or organization: Not on file    Attends meetings of clubs or organizations: Not on file    Relationship status: Not on file  . Intimate partner violence:    Fear of current or ex partner: Not on file    Emotionally abused: Not on file    Physically abused: Not on file    Forced sexual activity: Not on file  Other Topics Concern  . Not on file  Social History Narrative   Eats all food groups.    Wears seatbelt.    Has 3 children.   Prior smoker.   Attends church.    Used to work in [employer] fund[employer].    Drives.   Enjoys going out with family.    Family History  Problem Relation Age of Onset  . Diabetes Other   . Diabetes Mother   . Heart disease Mother   . Heart disease Father   . Diabetes Sister   . Diabetes Brother   . Hypertension Brother   .  Diabetes Daughter   . Hypertension Maternal Grandmother   . Stroke Maternal Grandfather   . Early death Paternal Grandfather   . Colon cancer Neg Hx   . Liver disease Neg Hx       Review of Systems  All other systems reviewed and are negative.      Objective:   Physical Exam Vitals signs reviewed.  Constitutional:      Appearance: She  is well-developed.  Neck:   Cardiovascular:     Rate and Rhythm: Normal rate and regular rhythm.     Heart sounds: Normal heart sounds. No murmur. No friction rub. No gallop.   Pulmonary:     Effort: Pulmonary effort is normal. No respiratory distress.     Breath sounds: Normal breath sounds. No stridor. No wheezing or rales.  Chest:     Chest wall: No tenderness.  Musculoskeletal:     Left knee: She exhibits decreased range of motion. She exhibits no LCL laxity, no bony tenderness, normal meniscus and no MCL laxity. Tenderness found. Lateral joint line tenderness noted. No medial joint line, no MCL, no LCL and no patellar tendon tenderness noted.  Neurological:     Motor: No abnormal muscle tone.           Assessment & Plan:  Chronic pain of left knee - Plan: DG Knee Complete 4 Views Right  I believe the patient likely has osteoarthritis versus meniscal tear in the left knee.  Pain sounds less neuropathic than previous.  Therefore I recommended we get an x-ray of the left knee and try cortisone shot for pain relief.  Patient agrees.  She can go get the x-ray at her convenience.  Meanwhile, using sterile technique, I injected the left knee with 2 cc lidocaine, 2 cc of Marcaine, and 2 cc of 40 mg/mL Kenalog.  Patient tolerated the procedure well without complication.  Recheck if no better in 2 weeks.  X-ray is ordered for the patient.  The lump on the left side of her neck appears to be a small normal-sized lymph node.  It could be related to her recent sinus infection.  For the present time we will clinically monitor.

## 2018-05-29 ENCOUNTER — Other Ambulatory Visit: Payer: Self-pay | Admitting: Family Medicine

## 2018-05-29 ENCOUNTER — Ambulatory Visit
Admission: RE | Admit: 2018-05-29 | Discharge: 2018-05-29 | Disposition: A | Discharge status: Home / Self Care | Payer: BLUE CROSS/BLUE SHIELD | Source: Ambulatory Visit | Attending: Family Medicine | Admitting: Family Medicine

## 2018-05-29 DIAGNOSIS — M1712 Unilateral primary osteoarthritis, left knee: Secondary | ICD-10-CM | POA: Diagnosis not present

## 2018-05-29 DIAGNOSIS — M25562 Pain in left knee: Secondary | ICD-10-CM

## 2018-05-29 DIAGNOSIS — G8929 Other chronic pain: Secondary | ICD-10-CM

## 2018-06-12 ENCOUNTER — Ambulatory Visit: Payer: Self-pay | Admitting: Orthopedic Surgery

## 2018-06-18 ENCOUNTER — Other Ambulatory Visit: Payer: Self-pay | Admitting: Family Medicine

## 2018-06-18 DIAGNOSIS — I10 Essential (primary) hypertension: Secondary | ICD-10-CM

## 2018-06-19 ENCOUNTER — Ambulatory Visit: Payer: BLUE CROSS/BLUE SHIELD | Admitting: Orthopedic Surgery

## 2018-06-23 ENCOUNTER — Ambulatory Visit (INDEPENDENT_AMBULATORY_CARE_PROVIDER_SITE_OTHER): Payer: BLUE CROSS/BLUE SHIELD | Admitting: Family Medicine

## 2018-06-23 ENCOUNTER — Encounter: Payer: Self-pay | Admitting: Family Medicine

## 2018-06-23 VITALS — BP 146/60 | HR 70 | Temp 98.2°F | Resp 14 | Ht 67.0 in | Wt 174.0 lb

## 2018-06-23 DIAGNOSIS — E118 Type 2 diabetes mellitus with unspecified complications: Secondary | ICD-10-CM

## 2018-06-23 DIAGNOSIS — F5101 Primary insomnia: Secondary | ICD-10-CM

## 2018-06-23 DIAGNOSIS — N3281 Overactive bladder: Secondary | ICD-10-CM | POA: Diagnosis not present

## 2018-06-23 DIAGNOSIS — R5382 Chronic fatigue, unspecified: Secondary | ICD-10-CM | POA: Diagnosis not present

## 2018-06-23 MED ORDER — MIRABEGRON ER 25 MG PO TB24: 25.0000 mg | ORAL_TABLET | Freq: Every day | ORAL | 1 refills | Status: DC

## 2018-06-23 NOTE — Progress Notes (Signed)
Subjective:    Patient ID: [name], female    DOB: 09/21/1955, 63 y.o.   MRN: [id number]  HPI  Patient presents today feeling tired.  However she attributes this to not sleeping well.  She states that she goes to sleep easily around 12:00 at night however she awakens after just 2 hours due to having to go to the bathroom to urinate.  She states that she will awaken 4 times a night in order to avoid.  After she awakens at 2 in the morning, she is unable to go back to sleep.  She states that she may only be getting 2 to 3 hours of sleep every evening.  This leaves her feeling very tired during the day.  She also is concerned that she has lost weight.  According to our scale she has lost 3 pounds since November.  She wonders if this has anything to do with the fatigue.  She reports polyuria.  She states that she has to urinate 5-6 times a day and 3-4 times every evening.  She states that she has sudden urges to urinate.  She denies any urge incontinence.  She denies any hypoglycemia.  She is checking her sugars and finds it to be in the 80s usually.  She does report polydipsia.  She denies any blurry vision. Past Medical History:  Diagnosis Date  . Abdominal aortic aneurysm (AAA) 3.0 cm to 5.0 cm in diameter in female [facility])    None seen on prior imaging?   Marland Kitchen Anxiety   . Cataract   . Chronic left shoulder pain   . Headache   . Hiatal hernia    small  . Hyperlipidemia   . Hypertension   . Pre-diabetes   . Prediabetes   . Schatzki's ring   . Tremor    Past Surgical History:  Procedure Laterality Date  . CARDIAC CATHETERIZATION  2005   "[name] has essentially normal coronary arteries and normal left ventricular function.  She will be treated empirically with anti-reflux measures"  . CATARACT EXTRACTION, BILATERAL    . CHOLECYSTECTOMY    . COLONOSCOPY    06/16/2004   [id number] rectum/Diminutive polyps of the splenic flexure and the cecum/The remainder of the colonic mucosa  appeared normal pathology (hyperplastic polyps).  . COLONOSCOPY N/A 08/07/2015   Dr. Gala Williams: 6 mm descending colon tubular adenoma, multiple medium-mouthed diverticula in sigmoid colon. surveillance 2022  . ESOPHAGOGASTRODUODENOSCOPY  09/11/2001   [id number] ring otherwise normal esophagus/Normal stomach/Normal D1 and D2 status post passage of a 56 [place] Maloney dilator  . ESOPHAGOGASTRODUODENOSCOPY N/A 11/11/2017   Procedure: ESOPHAGOGASTRODUODENOSCOPY (EGD);  Surgeon: Kimberly Dolin, MD;  [facility];  Service: Endoscopy;  Laterality: N/A;  8:15am  . ESOPHAGOGASTRODUODENOSCOPY (EGD) WITH ESOPHAGEAL DILATION N/A 01/25/2013   Dr. Gala Williams- schatzki's ring- 70 F dilation. small hiatal hernia  . MALONEY DILATION N/A 11/11/2017   Procedure: [name] DILATION;  Surgeon: Kimberly Dolin, MD;  Location: [facility];  Service: Endoscopy;  Laterality: N/A;  . TUBAL LIGATION     Current Outpatient Medications on File Prior to Visit  Medication Sig Dispense Refill  . ALPRAZolam (XANAX) 0.5 MG tablet TAKE 1 TABLET(0.5 MG) BY MOUTH TWICE DAILY AS NEEDED FOR ANXIETY 60 tablet 0  . amLODipine (NORVASC) 10 MG tablet Take 1 tablet (10 mg total) by mouth at bedtime. 90 tablet 3  . aspirin EC 81 MG tablet Take 81 mg by mouth every morning.     Marland Kitchen  atorvastatin (LIPITOR) 80 MG tablet Take 1 tablet (80 mg total) by mouth daily. 90 tablet 3  . augmented betamethasone dipropionate (DIPROLENE-AF) 0.05 % cream Apply 1 application topically daily as needed.  3  . cetirizine (ZYRTEC) 10 MG tablet Take 10 mg by mouth daily.    . diclofenac (VOLTAREN) 75 MG EC tablet Take 1 tablet (75 mg total) by mouth 2 (two) times daily. 30 tablet 0  . esomeprazole (NEXIUM) 40 MG capsule Take 1 capsule (40 mg total) by mouth daily. 90 capsule 3  . fluticasone (FLONASE) 50 MCG/ACT nasal spray Place 2 sprays into both nostrils daily. 16 g 6  . hydrochlorothiazide (HYDRODIURIL) 25 MG tablet TAKE 1 TABLET(25 MG) BY MOUTH DAILY 90  tablet 2  . ipratropium (ATROVENT) 0.03 % nasal spray Place 2 sprays into both nostrils every 12 (twelve) hours. 30 mL 12  . linaclotide (LINZESS) 145 MCG CAPS capsule Take 1 capsule (145 mcg total) by mouth daily before breakfast. 30 capsule 3  . metFORMIN (GLUCOPHAGE) 500 MG tablet TAKE 1 TABLET(500 MG) BY MOUTH TWICE DAILY WITH A MEAL 180 tablet 1  . metoprolol succinate (TOPROL-XL) 100 MG 24 hr tablet Take 1 tablet (100 mg total) by mouth daily. Take with or immediately following a meal. 90 tablet 3  . montelukast (SINGULAIR) 10 MG tablet Take 1 tablet (10 mg total) by mouth at bedtime. 30 tablet 3  . potassium chloride SA (K-DUR,KLOR-CON) 20 MEQ tablet Take 20 mEq by mouth daily.    . valsartan (DIOVAN) 320 MG tablet Take 1 tablet (320 mg total) by mouth every morning. 90 tablet 3  . vitamin B-12 (CYANOCOBALAMIN) 1000 MCG tablet Take 1,000 mcg by mouth daily.    . vitamin C (ASCORBIC ACID) 500 MG tablet Take 1,000 mg by mouth daily.     No current facility-administered medications on file prior to visit.    Allergies  Allergen Reactions  . Ciprofloxacin Shortness Of Breath   Social History   Socioeconomic History  . Marital status: Single    Spouse name: Not on file  . Number of children: 3  . Years of education: Not on file  . Highest education level: Not on file  Occupational History  . 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 Financial resource strain: Not on file  . Food insecurity:    Worry: Not on file    Inability: Not on file  . Transportation needs:    Medical: Not on file    Non-medical: Not on file  Tobacco Use  . Smoking status: Former Smoker    Packs/day: 0.25    Years: 35.00    Pack years: 8.75    Types: Cigarettes    Last attempt to quit: 12/31/2012    Years since quitting: 5.4  . Smokeless tobacco: Never Used  Substance and Sexual Activity  . Alcohol use: Yes    Comment: occasionaly  . Drug use: No  . Sexual  activity: Yes    Partners: Male    Birth control/protection: Post-menopausal  Lifestyle  . Physical activity:    Days per week: Not on file    Minutes per session: Not on file  . Stress: Not on file  Relationships  . Social connections:    Talks on phone: Not on file    Gets together: Not on file    Attends religious service: Not on file    Active member of club or organization: Not on file  Attends meetings of clubs or organizations: Not on file    Relationship status: Not on file  . Intimate partner violence:    Fear of current or ex partner: Not on file    Emotionally abused: Not on file    Physically abused: Not on file    Forced sexual activity: Not on file  Other Topics Concern  . Not on file  Social History Narrative   Eats all food groups.    Wears seatbelt.    Has 3 children.   Prior smoker.   Attends church.    Used to work in [employer] fund[employer].    Drives.   Enjoys going out with family.    Family History  Problem Relation Age of Onset  . Diabetes Other   . Diabetes Mother   . Heart disease Mother   . Heart disease Father   . Diabetes Sister   . Diabetes Brother   . Hypertension Brother   . Diabetes Daughter   . Hypertension Maternal Grandmother   . Stroke Maternal Grandfather   . Early death Paternal Grandfather   . Colon cancer Neg Hx   . Liver disease Neg Hx       Review of Systems  All other systems reviewed and are negative.      Objective:   Physical Exam Vitals signs reviewed.  Constitutional:      Appearance: She is well-developed.  Cardiovascular:     Rate and Rhythm: Normal rate and regular rhythm.     Heart sounds: Normal heart sounds. No murmur. No friction rub. No gallop.   Pulmonary:     Effort: Pulmonary effort is normal. No respiratory distress.     Breath sounds: Normal breath sounds. No stridor. No wheezing or rales.  Chest:     Chest wall: No tenderness.  Neurological:     Motor: No abnormal muscle tone.             Assessment & Plan:  OAB (overactive bladder)  Primary insomnia  Controlled type 2 diabetes mellitus with complication, without long-term current use of insulin ([place]) - Plan: Hemoglobin A1c, CBC with Differential/Platelet, COMPLETE METABOLIC PANEL WITH GFR, Lipid panel, Hemoglobin A1c, Microalbumin, urine  Chronic fatigue - Plan: TSH  I believe her fatigue is likely due to her inability to sleep.  I believe her inability to sleep is probably due to overactive bladder.  Therefore I recommended try Myrbetriq 25 mg a day and increasing to 50 mg a day in 1 month if the 25 mg is unsuccessful.  I hope that if I can reduce her bathroom frequency, she will sleep better at night and not require something for insomnia.  If she continues to suffer from insomnia we could try trazodone.  Even her polyuria, I will check a hemoglobin A1c.  Given her fatigue I would like to check a TSH.  I will obtain fasting labs including a CBC, CMP, fasting lipid panel, hemoglobin A1c at the patient's earliest convenience.  Reassess in 1 month after she is tried Myrbetriq to see if symptoms are improving.

## 2018-06-26 DIAGNOSIS — H04123 Dry eye syndrome of bilateral lacrimal glands: Secondary | ICD-10-CM | POA: Diagnosis not present

## 2018-06-27 ENCOUNTER — Other Ambulatory Visit: Payer: BLUE CROSS/BLUE SHIELD

## 2018-06-27 DIAGNOSIS — E118 Type 2 diabetes mellitus with unspecified complications: Secondary | ICD-10-CM | POA: Diagnosis not present

## 2018-06-27 DIAGNOSIS — R5382 Chronic fatigue, unspecified: Secondary | ICD-10-CM | POA: Diagnosis not present

## 2018-06-28 LAB — CBC WITH DIFFERENTIAL/PLATELET
Absolute Monocytes: 564 cells/uL (ref 200–950)
Basophils Absolute: 17 cells/uL (ref 0–200)
Basophils Relative: 0.2 %
Eosinophils Absolute: 133 cells/uL (ref 15–500)
Eosinophils Relative: 1.6 %
HCT: 39 % (ref 35.0–45.0)
Hemoglobin: 12.9 g/dL (ref 11.7–15.5)
Lymphs Abs: 3420 cells/uL (ref 850–3900)
MCH: 29.8 pg (ref 27.0–33.0)
MCHC: 33.1 g/dL (ref 32.0–36.0)
MCV: 90.1 fL (ref 80.0–100.0)
MPV: 9.9 fL (ref 7.5–12.5)
Monocytes Relative: 6.8 %
Neutro Abs: 4167 cells/uL (ref 1500–7800)
Neutrophils Relative %: 50.2 %
Platelets: 259 10*3/uL (ref 140–400)
RBC: 4.33 10*6/uL (ref 3.80–5.10)
RDW: 12.9 % (ref 11.0–15.0)
Total Lymphocyte: 41.2 %
WBC: 8.3 10*3/uL (ref 3.8–10.8)

## 2018-06-28 LAB — COMPLETE METABOLIC PANEL WITH GFR
AG Ratio: 1.5 (calc) (ref 1.0–2.5)
ALT: 39 U/L — ABNORMAL HIGH (ref 6–29)
AST: 21 U/L (ref 10–35)
Albumin: 3.9 g/dL (ref 3.6–5.1)
Alkaline phosphatase (APISO): 90 U/L (ref 37–153)
BUN: 8 mg/dL (ref 7–25)
CO2: 33 mmol/L — ABNORMAL HIGH (ref 20–32)
Calcium: 9.5 mg/dL (ref 8.6–10.4)
Chloride: 102 mmol/L (ref 98–110)
Creat: 0.83 mg/dL (ref 0.50–0.99)
GFR, Est African American: 86 mL/min/{1.73_m2} (ref >=60)
GFR, Est Non African American: 75 mL/min/{1.73_m2} (ref >=60)
Globulin: 2.6 g/dL (calc) (ref 1.9–3.7)
Glucose, Bld: 107 mg/dL — ABNORMAL HIGH (ref 65–99)
Potassium: 3.9 mmol/L (ref 3.5–5.3)
Sodium: 141 mmol/L (ref 135–146)
Total Bilirubin: 0.6 mg/dL (ref 0.2–1.2)
Total Protein: 6.5 g/dL (ref 6.1–8.1)

## 2018-06-28 LAB — HEMOGLOBIN A1C
Hgb A1c MFr Bld: 6.6 % of total Hgb — ABNORMAL HIGH (ref <5.7)
Mean Plasma Glucose: 143 (calc)
eAG (mmol/L): 7.9 (calc)

## 2018-06-28 LAB — MICROALBUMIN, URINE: Microalb, Ur: 0.3 mg/dL

## 2018-06-28 LAB — LIPID PANEL
Cholesterol: 157 mg/dL (ref <200)
HDL: 66 mg/dL (ref >=50)
LDL Cholesterol (Calc): 77 mg/dL (calc)
Non-HDL Cholesterol (Calc): 91 mg/dL (calc) (ref <130)
Total CHOL/HDL Ratio: 2.4 (calc) (ref <5.0)
Triglycerides: 49 mg/dL (ref <150)

## 2018-06-28 LAB — TSH: TSH: 2.11 mIU/L (ref 0.40–4.50)

## 2018-06-30 ENCOUNTER — Encounter: Payer: Self-pay | Admitting: *Deleted

## 2018-07-17 ENCOUNTER — Other Ambulatory Visit: Payer: Self-pay | Admitting: Family Medicine

## 2018-07-17 DIAGNOSIS — J329 Chronic sinusitis, unspecified: Secondary | ICD-10-CM

## 2018-08-04 ENCOUNTER — Telehealth: Payer: Self-pay | Admitting: Family Medicine

## 2018-08-04 NOTE — Telephone Encounter (Signed)
Pt lvm to ask ?'s about linzess, states since she has stopped it she is having burning sensation in her abdomen.

## 2018-08-06 ENCOUNTER — Other Ambulatory Visit: Payer: Self-pay | Admitting: Family Medicine

## 2018-08-06 DIAGNOSIS — E119 Type 2 diabetes mellitus without complications: Secondary | ICD-10-CM

## 2018-08-07 NOTE — Telephone Encounter (Signed)
LMTRC

## 2018-08-09 NOTE — Telephone Encounter (Signed)
LMTRC

## 2018-08-15 NOTE — Telephone Encounter (Signed)
No return call - closing chart

## 2018-08-28 ENCOUNTER — Encounter: Payer: Self-pay | Admitting: Family Medicine

## 2018-08-28 ENCOUNTER — Ambulatory Visit (INDEPENDENT_AMBULATORY_CARE_PROVIDER_SITE_OTHER): Payer: BLUE CROSS/BLUE SHIELD | Admitting: Family Medicine

## 2018-08-28 ENCOUNTER — Other Ambulatory Visit: Payer: Self-pay

## 2018-08-28 VITALS — BP 138/86 | HR 64 | Temp 98.7°F | Resp 14 | Ht 67.0 in | Wt 172.0 lb

## 2018-08-28 DIAGNOSIS — R1013 Epigastric pain: Secondary | ICD-10-CM | POA: Diagnosis not present

## 2018-08-28 LAB — CBC WITH DIFFERENTIAL/PLATELET
Absolute Monocytes: 533 cells/uL (ref 200–950)
Basophils Absolute: 30 cells/uL (ref 0–200)
Basophils Relative: 0.4 %
Eosinophils Absolute: 143 cells/uL (ref 15–500)
Eosinophils Relative: 1.9 %
HCT: 38.7 % (ref 35.0–45.0)
Hemoglobin: 13.2 g/dL (ref 11.7–15.5)
Lymphs Abs: 2738 cells/uL (ref 850–3900)
MCH: 30.1 pg (ref 27.0–33.0)
MCHC: 34.1 g/dL (ref 32.0–36.0)
MCV: 88.4 fL (ref 80.0–100.0)
MPV: 9.9 fL (ref 7.5–12.5)
Monocytes Relative: 7.1 %
Neutro Abs: 4058 cells/uL (ref 1500–7800)
Neutrophils Relative %: 54.1 %
Platelets: 294 10*3/uL (ref 140–400)
RBC: 4.38 10*6/uL (ref 3.80–5.10)
RDW: 12.8 % (ref 11.0–15.0)
Total Lymphocyte: 36.5 %
WBC: 7.5 10*3/uL (ref 3.8–10.8)

## 2018-08-28 LAB — COMPLETE METABOLIC PANEL WITH GFR
AG Ratio: 1.7 (calc) (ref 1.0–2.5)
ALT: 30 U/L — ABNORMAL HIGH (ref 6–29)
AST: 21 U/L (ref 10–35)
Albumin: 4.4 g/dL (ref 3.6–5.1)
Alkaline phosphatase (APISO): 85 U/L (ref 37–153)
BUN: 10 mg/dL (ref 7–25)
CO2: 27 mmol/L (ref 20–32)
Calcium: 9.8 mg/dL (ref 8.6–10.4)
Chloride: 99 mmol/L (ref 98–110)
Creat: 0.73 mg/dL (ref 0.50–0.99)
GFR, Est African American: 101 mL/min/{1.73_m2} (ref >=60)
GFR, Est Non African American: 87 mL/min/{1.73_m2} (ref >=60)
Globulin: 2.6 g/dL (calc) (ref 1.9–3.7)
Glucose, Bld: 110 mg/dL — ABNORMAL HIGH (ref 65–99)
Potassium: 3.7 mmol/L (ref 3.5–5.3)
Sodium: 136 mmol/L (ref 135–146)
Total Bilirubin: 1 mg/dL (ref 0.2–1.2)
Total Protein: 7 g/dL (ref 6.1–8.1)

## 2018-08-28 LAB — LIPASE: Lipase: 9 U/L (ref 7–60)

## 2018-08-28 MED ORDER — SUCRALFATE 1 G PO TABS: 1.0000 g | ORAL_TABLET | Freq: Three times a day (TID) | ORAL | 1 refills | Status: DC

## 2018-08-28 NOTE — Progress Notes (Signed)
Subjective:    Patient ID: [name], female    DOB: [birth year], 63 y.o.   MRN: [id number]  HPI  Patient presents today with a burning pain in her stomach.  The burning pain is been present for several days.  It is made worse by food.  She also feels constantly nauseated.  She reports cramping pain in her upper epigastric area.  Past medical history is significant for an EGD in July of last year which showed gastric erosions.  At that time she was placed on twice daily Nexium however she is having burning pain despite taking the Nexium.  She does not drink.  She denies any history of issues with her pancreas.  Her gallbladder was removed in the past.  She denies any melena or hematochezia.  She denies any fevers or chills but she does report poor appetite.  She has not taken the metformin for over a month.  She is lost approximately 3 pounds since January due to her decreasing appetite Past Medical History:  Diagnosis Date  . Abdominal aortic aneurysm (AAA) 3.0 cm to 5.0 cm in diameter in female [facility])    None seen on prior imaging?   Marland Kitchen Anxiety   . Cataract   . Chronic left shoulder pain   . Headache   . Hiatal hernia    small  . Hyperlipidemia   . Hypertension   . Pre-diabetes   . Prediabetes   . Schatzki's ring   . Tremor    Past Surgical History:  Procedure Laterality Date  . CARDIAC CATHETERIZATION  2005   "Ms. Deiter has essentially normal coronary arteries and normal left ventricular function.  She will be treated empirically with anti-reflux measures"  . CATARACT EXTRACTION, BILATERAL    . CHOLECYSTECTOMY    . COLONOSCOPY    06/16/2004   SEG:[id number] rectum/Diminutive polyps of the splenic flexure and the cecum/The remainder of the colonic mucosa appeared normal pathology (hyperplastic polyps).  . COLONOSCOPY N/A 08/07/2015   Dr. Gala Romney: 6 mm descending colon tubular adenoma, multiple medium-mouthed diverticula in sigmoid colon. surveillance 2022  .  ESOPHAGOGASTRODUODENOSCOPY  09/11/2001   [id number] ring otherwise normal esophagus/Normal stomach/Normal D1 and D2 status post passage of a 56 [place] Maloney dilator  . ESOPHAGOGASTRODUODENOSCOPY N/A 11/11/2017   Procedure: ESOPHAGOGASTRODUODENOSCOPY (EGD);  Surgeon: Daneil Dolin, MD;  [facility];  Service: Endoscopy;  Laterality: N/A;  8:15am  . ESOPHAGOGASTRODUODENOSCOPY (EGD) WITH ESOPHAGEAL DILATION N/A 01/25/2013   Dr. Gala Romney- schatzki's ring- 43 F dilation. small hiatal hernia  . MALONEY DILATION N/A 11/11/2017   Procedure: [name] DILATION;  Surgeon: Daneil Dolin, MD;  [facility];  Service: Endoscopy;  Laterality: N/A;  . TUBAL LIGATION     Current Outpatient Medications on File Prior to Visit  Medication Sig Dispense Refill  . ALPRAZolam (XANAX) 0.5 MG tablet TAKE 1 TABLET(0.5 MG) BY MOUTH TWICE DAILY AS NEEDED FOR ANXIETY 60 tablet 0  . amLODipine (NORVASC) 10 MG tablet Take 1 tablet (10 mg total) by mouth at bedtime. 90 tablet 3  . aspirin EC 81 MG tablet Take 81 mg by mouth every morning.     Marland Kitchen atorvastatin (LIPITOR) 80 MG tablet Take 1 tablet (80 mg total) by mouth daily. 90 tablet 3  . augmented betamethasone dipropionate (DIPROLENE-AF) 0.05 % cream Apply 1 application topically daily as needed.  3  . cetirizine (ZYRTEC) 10 MG tablet Take 10 mg by mouth daily.    Marland Kitchen esomeprazole ([place])  40 MG capsule Take 1 capsule (40 mg total) by mouth daily. 90 capsule 3  . fluticasone (FLONASE) 50 MCG/ACT nasal spray Place 2 sprays into both nostrils daily. 16 g 6  . hydrochlorothiazide (HYDRODIURIL) 25 MG tablet TAKE 1 TABLET(25 MG) BY MOUTH DAILY 90 tablet 2  . ipratropium (ATROVENT) 0.03 % nasal spray Place 2 sprays into both nostrils every 12 (twelve) hours. 30 mL 12  . linaclotide (LINZESS) 145 MCG CAPS capsule Take 1 capsule (145 mcg total) by mouth daily before breakfast. 30 capsule 3  . metFORMIN (GLUCOPHAGE) 500 MG tablet TAKE 1 TABLET(500 MG) BY MOUTH  TWICE DAILY WITH A MEAL 180 tablet 0  . metoprolol succinate (TOPROL-XL) 100 MG 24 hr tablet Take 1 tablet (100 mg total) by mouth daily. Take with or immediately following a meal. 90 tablet 3  . mirabegron ER (MYRBETRIQ) 25 MG TB24 tablet Take 1 tablet (25 mg total) by mouth daily. 30 tablet 1  . montelukast (SINGULAIR) 10 MG tablet TAKE 1 TABLET(10 MG) BY MOUTH AT BEDTIME 30 tablet 3  . potassium chloride SA (K-DUR,KLOR-CON) 20 MEQ tablet Take 20 mEq by mouth daily.    . valsartan (DIOVAN) 320 MG tablet Take 1 tablet (320 mg total) by mouth every morning. 90 tablet 3  . vitamin B-12 (CYANOCOBALAMIN) 1000 MCG tablet Take 1,000 mcg by mouth daily.    . vitamin C (ASCORBIC ACID) 500 MG tablet Take 1,000 mg by mouth daily.     No current facility-administered medications on file prior to visit.    Allergies  Allergen Reactions  . Ciprofloxacin Shortness Of Breath   Social History   Socioeconomic History  . Marital status: Single    Spouse name: Not on file  . Number of children: 3  . Years of education: Not on file  . Highest education level: Not on file  Occupational History  . 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 Financial resource strain: Not on file  . Food insecurity:    Worry: Not on file    Inability: Not on file  . Transportation needs:    Medical: Not on file    Non-medical: Not on file  Tobacco Use  . Smoking status: Former Smoker    Packs/day: 0.25    Years: 35.00    Pack years: 8.75    Types: Cigarettes    Last attempt to quit: 12/31/2012    Years since quitting: 5.6  . Smokeless tobacco: Never Used  Substance and Sexual Activity  . Alcohol use: Yes    Comment: occasionaly  . Drug use: No  . Sexual activity: Yes    Partners: Male    Birth control/protection: Post-menopausal  Lifestyle  . Physical activity:    Days per week: Not on file    Minutes per session: Not on file  . Stress: Not on file  Relationships  .  Social connections:    Talks on phone: Not on file    Gets together: Not on file    Attends religious service: Not on file    Active member of club or organization: Not on file    Attends meetings of clubs or organizations: Not on file    Relationship status: Not on file  . Intimate partner violence:    Fear of current or ex partner: Not on file    Emotionally abused: Not on file    Physically abused: Not on file    Forced sexual  activity: Not on file  Other Topics Concern  . Not on file  Social History Narrative   Eats all food groups.    Wears seatbelt.    Has 3 children.   Prior smoker.   Attends church.    Used to work in [employer] fund[employer].    Drives.   Enjoys going out with family.    Family History  Problem Relation Age of Onset  . Diabetes Other   . Diabetes Mother   . Heart disease Mother   . Heart disease Father   . Diabetes Sister   . Diabetes Brother   . Hypertension Brother   . Diabetes Daughter   . Hypertension Maternal Grandmother   . Stroke Maternal Grandfather   . Early death Paternal Grandfather   . Colon cancer Neg Hx   . Liver disease Neg Hx       Review of Systems  All other systems reviewed and are negative.      Objective:   Physical Exam Vitals signs reviewed.  Constitutional:      Appearance: She is well-developed.  Cardiovascular:     Rate and Rhythm: Normal rate and regular rhythm.     Heart sounds: Normal heart sounds. No murmur. No friction rub. No gallop.   Pulmonary:     Effort: Pulmonary effort is normal. No respiratory distress.     Breath sounds: Normal breath sounds. No stridor. No wheezing or rales.  Chest:     Chest wall: No tenderness.  Neurological:     Motor: No abnormal muscle tone.           Assessment & Plan:  Epigastric pain - Plan: H. pylori breath test, Lipase, CBC with Differential/Platelet, COMPLETE METABOLIC PANEL WITH GFR  Differential diagnosis includes irritable bowel syndrome, peptic ulcer disease,  pancreatitis, etc.  We will add sucralfate 1 g p.o. q. before meals at bedtime and reassess the patient in 2 weeks.  Rule out H. pylori gastritis by obtaining a H. pylori breath test.  Check for pancreatitis with a lipase.  If symptoms persist despite the addition of sucralfate, I would recommend a CT scan of the abdomen and pelvis given her weight loss.  Keep irritable bowel syndrome also on the differential diagnosis

## 2018-08-29 LAB — H. PYLORI BREATH TEST: H. pylori Breath Test: NOT DETECTED

## 2018-09-18 ENCOUNTER — Other Ambulatory Visit: Payer: Self-pay | Admitting: Family Medicine

## 2018-09-18 ENCOUNTER — Other Ambulatory Visit: Payer: Self-pay

## 2018-09-18 DIAGNOSIS — J302 Other seasonal allergic rhinitis: Secondary | ICD-10-CM

## 2018-09-18 MED ORDER — FLUTICASONE PROPIONATE 50 MCG/ACT NA SUSP: 2.0000 | Freq: Every day | NASAL | 6 refills | Status: DC

## 2018-09-19 ENCOUNTER — Ambulatory Visit (INDEPENDENT_AMBULATORY_CARE_PROVIDER_SITE_OTHER): Payer: BLUE CROSS/BLUE SHIELD | Admitting: Family Medicine

## 2018-09-19 ENCOUNTER — Encounter: Payer: Self-pay | Admitting: Family Medicine

## 2018-09-19 ENCOUNTER — Other Ambulatory Visit: Payer: Self-pay

## 2018-09-19 VITALS — BP 160/72 | HR 68 | Temp 98.6°F | Resp 16 | Ht 67.0 in | Wt 173.0 lb

## 2018-09-19 DIAGNOSIS — R1013 Epigastric pain: Secondary | ICD-10-CM | POA: Diagnosis not present

## 2018-09-19 DIAGNOSIS — L253 Unspecified contact dermatitis due to other chemical products: Secondary | ICD-10-CM | POA: Diagnosis not present

## 2018-09-19 MED ORDER — TRIAMCINOLONE ACETONIDE 0.1 % EX CREA: 1.0000 "application " | TOPICAL_CREAM | Freq: Two times a day (BID) | CUTANEOUS | 0 refills | Status: DC

## 2018-09-19 NOTE — Progress Notes (Signed)
Subjective:    Patient ID: [name], female    DOB: [birth year], 63 y.o.   MRN: [id number]  HPI  08/28/18 Patient presents today with a burning pain in her stomach.  The burning pain is been present for several days.  It is made worse by food.  She also feels constantly nauseated.  She reports cramping pain in her upper epigastric area.  Past medical history is significant for an EGD in July of last year which showed gastric erosions.  At that time she was placed on twice daily Nexium however she is having burning pain despite taking the Nexium.  She does not drink.  She denies any history of issues with her pancreas.  Her gallbladder was removed in the past.  She denies any melena or hematochezia.  She denies any fevers or chills but she does report poor appetite.  She has not taken the metformin for over a month.  She is lost approximately 3 pounds since January due to her decreasing appetite.  At that time, my plan was: Differential diagnosis includes irritable bowel syndrome, peptic ulcer disease, pancreatitis, etc.  We will add sucralfate 1 g p.o. q. before meals at bedtime and reassess the patient in 2 weeks.  Rule out H. pylori gastritis by obtaining a H. pylori breath test.  Check for pancreatitis with a lipase.  If symptoms persist despite the addition of sucralfate, I would recommend a CT scan of the abdomen and pelvis given her weight loss.  Keep irritable bowel syndrome also on the differential diagnosis  09/19/18 Patient states that the pain in her stomach has improved dramatically since starting sucralfate.  It is much much better and is now just very mild and on occasion.  She is still taking sucralfate 3-4 times a day.  She has been on the medication for approximately 3 weeks.  Her H. pylori breath test and the remainder of her lab work were normal.  The reason she presents today is due to a rash on her back and on her hands.  Roughly the time I saw her last, she was getting her  hair fixed.  She had a "we have" put in her hair.  There were chemicals that she had to wash out of her hair that night.  On her hands where she was ringing her hair out is now an eczema-like papular rash in the webspace between the thumb and the index finger on both sides.  The chemical rinse down in the shower along her back into the gluteal cleft.  On her lower back and into the gluteal cleft is also an erythematous papular rash similar to what is in the webspace between her thumb and index finger.  Is extremely itchy.  She is tried cortisone cream, which hazel, peroxide, and nothing is helping the rash.  It itches.  The rash is located nowhere else on the body other than the areas described Past Medical History:  Diagnosis Date  . Abdominal aortic aneurysm (AAA) 3.0 cm to 5.0 cm in diameter in female [facility])    None seen on prior imaging?   Marland Kitchen Anxiety   . Cataract   . Chronic left shoulder pain   . Headache   . Hiatal hernia    small  . Hyperlipidemia   . Hypertension   . Pre-diabetes   . Prediabetes   . Schatzki's ring   . Tremor    Past Surgical History:  Procedure Laterality Date  . CARDIAC CATHETERIZATION  2005   "[name] has essentially normal coronary arteries and normal left ventricular function.  She will be treated empirically with anti-reflux measures"  . CATARACT EXTRACTION, BILATERAL    . CHOLECYSTECTOMY    . COLONOSCOPY    06/16/2004   JOA:CZYSAY rectum/Diminutive polyps of the splenic flexure and the cecum/The remainder of the colonic mucosa appeared normal pathology (hyperplastic polyps).  . COLONOSCOPY N/A 08/07/2015   Dr. Gala Williams: 6 mm descending colon tubular adenoma, multiple medium-mouthed diverticula in sigmoid colon. surveillance 2022  . ESOPHAGOGASTRODUODENOSCOPY  09/11/2001   TKZ:SWFUXNATF ring otherwise normal esophagus/Normal stomach/Normal D1 and D2 status post passage of a 56 [place] Maloney dilator  . ESOPHAGOGASTRODUODENOSCOPY N/A 11/11/2017   Procedure:  ESOPHAGOGASTRODUODENOSCOPY (EGD);  Surgeon: Kimberly Dolin, MD;  [facility];  Service: Endoscopy;  Laterality: N/A;  8:15am  . ESOPHAGOGASTRODUODENOSCOPY (EGD) WITH ESOPHAGEAL DILATION N/A 01/25/2013   Dr. Gala Williams- schatzki's ring- 75 F dilation. small hiatal hernia  . MALONEY DILATION N/A 11/11/2017   Procedure: [name] DILATION;  Surgeon: Kimberly Dolin, MD;  Location: [facility];  Service: Endoscopy;  Laterality: N/A;  . TUBAL LIGATION     Current Outpatient Medications on File Prior to Visit  Medication Sig Dispense Refill  . ALPRAZolam (XANAX) 0.5 MG tablet TAKE 1 TABLET(0.5 MG) BY MOUTH TWICE DAILY AS NEEDED FOR ANXIETY 60 tablet 0  . amLODipine (NORVASC) 10 MG tablet Take 1 tablet (10 mg total) by mouth at bedtime. 90 tablet 3  . aspirin EC 81 MG tablet Take 81 mg by mouth every morning.     Marland Kitchen atorvastatin (LIPITOR) 80 MG tablet Take 1 tablet (80 mg total) by mouth daily. 90 tablet 3  . augmented betamethasone dipropionate (DIPROLENE-AF) 0.05 % cream Apply 1 application topically daily as needed.  3  . cetirizine (ZYRTEC) 10 MG tablet Take 10 mg by mouth daily.    Marland Kitchen esomeprazole (NEXIUM) 40 MG capsule Take 1 capsule (40 mg total) by mouth daily. 90 capsule 3  . fluticasone (FLONASE) 50 MCG/ACT nasal spray Place 2 sprays into both nostrils daily. 16 g 6  . hydrochlorothiazide (HYDRODIURIL) 25 MG tablet TAKE 1 TABLET(25 MG) BY MOUTH DAILY 90 tablet 2  . ipratropium (ATROVENT) 0.03 % nasal spray Place 2 sprays into both nostrils every 12 (twelve) hours. 30 mL 12  . linaclotide (LINZESS) 145 MCG CAPS capsule Take 1 capsule (145 mcg total) by mouth daily before breakfast. 30 capsule 3  . metFORMIN (GLUCOPHAGE) 500 MG tablet TAKE 1 TABLET(500 MG) BY MOUTH TWICE DAILY WITH A MEAL 180 tablet 0  . metoprolol succinate (TOPROL-XL) 100 MG 24 hr tablet Take 1 tablet (100 mg total) by mouth daily. Take with or immediately following a meal. 90 tablet 3  . mirabegron ER (MYRBETRIQ) 25 MG  TB24 tablet Take 1 tablet (25 mg total) by mouth daily. 30 tablet 1  . montelukast (SINGULAIR) 10 MG tablet TAKE 1 TABLET(10 MG) BY MOUTH AT BEDTIME 30 tablet 3  . potassium chloride SA (K-DUR,KLOR-CON) 20 MEQ tablet Take 20 mEq by mouth daily.    . sucralfate (CARAFATE) 1 g tablet Take 1 tablet (1 g total) by mouth 4 (four) times daily -  with meals and at bedtime. 120 tablet 1  . valsartan (DIOVAN) 320 MG tablet Take 1 tablet (320 mg total) by mouth every morning. 90 tablet 3  . vitamin B-12 (CYANOCOBALAMIN) 1000 MCG tablet Take 1,000 mcg by mouth daily.    . vitamin C (ASCORBIC ACID) 500  MG tablet Take 1,000 mg by mouth daily.     No current facility-administered medications on file prior to visit.    Allergies  Allergen Reactions  . Ciprofloxacin Shortness Of Breath   Social History   Socioeconomic History  . Marital status: Single    Spouse name: Not on file  . Number of children: 3  . Years of education: Not on file  . Highest education level: Not on file  Occupational History  . 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 Financial resource strain: Not on file  . Food insecurity:    Worry: Not on file    Inability: Not on file  . Transportation needs:    Medical: Not on file    Non-medical: Not on file  Tobacco Use  . Smoking status: Former Smoker    Packs/day: 0.25    Years: 35.00    Pack years: 8.75    Types: Cigarettes    Last attempt to quit: 12/31/2012    Years since quitting: 5.7  . Smokeless tobacco: Never Used  Substance and Sexual Activity  . Alcohol use: Yes    Comment: occasionaly  . Drug use: No  . Sexual activity: Yes    Partners: Male    Birth control/protection: Post-menopausal  Lifestyle  . Physical activity:    Days per week: Not on file    Minutes per session: Not on file  . Stress: Not on file  Relationships  . Social connections:    Talks on phone: Not on file    Gets together: Not on file    Attends  religious service: Not on file    Active member of club or organization: Not on file    Attends meetings of clubs or organizations: Not on file    Relationship status: Not on file  . Intimate partner violence:    Fear of current or ex partner: Not on file    Emotionally abused: Not on file    Physically abused: Not on file    Forced sexual activity: Not on file  Other Topics Concern  . Not on file  Social History Narrative   Eats all food groups.    Wears seatbelt.    Has 3 children.   Prior smoker.   Attends church.    Used to work in [employer].    Drives.   Enjoys going out with family.    Family History  Problem Relation Age of Onset  . Diabetes Other   . Diabetes Mother   . Heart disease Mother   . Heart disease Father   . Diabetes Sister   . Diabetes Brother   . Hypertension Brother   . Diabetes Daughter   . Hypertension Maternal Grandmother   . Stroke Maternal Grandfather   . Early death Paternal Grandfather   . Colon cancer Neg Hx   . Liver disease Neg Hx       Review of Systems  All other systems reviewed and are negative.      Objective:   Physical Exam Vitals signs reviewed.  Constitutional:      Appearance: She is well-developed.  Cardiovascular:     Rate and Rhythm: Normal rate and regular rhythm.     Heart sounds: Normal heart sounds. No murmur. No friction rub. No gallop.   Pulmonary:     Effort: Pulmonary effort is normal. No respiratory distress.     Breath sounds: Normal breath sounds. No stridor.  No wheezing or rales.  Chest:     Chest wall: No tenderness.  Musculoskeletal:       Back:       Hands:  Neurological:     Motor: No abnormal muscle tone.           Assessment & Plan:  Epigastric pain  Chemical dermatitis  Abdominal pain is improving dramatically on sucralfate.  Continue the medication for an additional 3 to 4 weeks and then discontinue the medication.  I am presumptively treating the patient for gastritis or  possibly a mild ulcer.  If the pain persist or worsens, I would recommend GI consult with EGD.  Second I believe the patient has a chemical dermatitis.  I recommended using triamcinolone cream be applied twice daily to the affected areas for 7 to 10 days.  Recheck via telephone if symptoms do not improve.

## 2018-09-22 ENCOUNTER — Telehealth: Payer: Self-pay | Admitting: Family Medicine

## 2018-09-22 MED ORDER — PREDNISONE 20 MG PO TABS: ORAL_TABLET | ORAL | 0 refills | Status: DC

## 2018-09-22 NOTE — Telephone Encounter (Signed)
Do prednisone taper pack

## 2018-09-22 NOTE — Telephone Encounter (Signed)
Pt aware and med sent to pharm 

## 2018-09-22 NOTE — Telephone Encounter (Signed)
Pt called and states that the cream is not helping her itching at all.

## 2018-09-26 ENCOUNTER — Telehealth: Payer: Self-pay | Admitting: Family Medicine

## 2018-09-26 NOTE — Telephone Encounter (Signed)
Losartan 100 mg poqday

## 2018-09-26 NOTE — Telephone Encounter (Signed)
Pharmacy unable to get valsartan 320mg   - what can we change this to?

## 2018-09-27 MED ORDER — LOSARTAN POTASSIUM 100 MG PO TABS: 100.0000 mg | ORAL_TABLET | Freq: Every day | ORAL | 3 refills | Status: DC

## 2018-09-27 NOTE — Telephone Encounter (Signed)
Pt aware and med sent to pharm 

## 2018-09-30 ENCOUNTER — Telehealth: Payer: BLUE CROSS/BLUE SHIELD | Admitting: Physician Assistant

## 2018-09-30 DIAGNOSIS — R202 Paresthesia of skin: Secondary | ICD-10-CM

## 2018-09-30 DIAGNOSIS — R634 Abnormal weight loss: Secondary | ICD-10-CM

## 2018-09-30 DIAGNOSIS — M549 Dorsalgia, unspecified: Secondary | ICD-10-CM

## 2018-09-30 NOTE — Progress Notes (Signed)
Based on what you shared with me, I feel your condition warrants further evaluation and I recommend that you be seen for a face to face office visit. Giving the mention of abnormal sensation, lack of appetite and unexplained weight loss along with the back pain, you will need to be seen in person.   NOTE: If you entered your credit card information for this eVisit, you will not be charged. You may see a "hold" on your card for the $35 but that hold will drop off and you will not have a charge processed.  If you are having a true medical emergency please call 911.  If you need an urgent face to face visit,  has four urgent care centers for your convenience.    PLEASE NOTE: THE [facility] LOCATIONS AND URGENT CARE CLINICS DO NOT HAVE THE TESTING FOR CORONAVIRUS COVID19 AVAILABLE.  IF YOU FEEL YOU NEED THIS TEST YOU MUST GO TO A TRIAGE LOCATION AT [place]   [email or web address] to reserve your spot online an avoid wait times  [facility] [address], [address] [place], [place] [address] Modified hours of operation: Monday-Friday, 12 PM to 6 PM  Saturday & Sunday 10 AM to 4 PM *Across the street from [place] (New Address!) [address], [place], [place] [address] *Just off [address], across the road from [place] hours of operation: Monday-Friday, 12 PM to 6 PM  Closed Saturday & Sunday  [facility]'s modified hours of operation will be in effect from May 1 until May 31   The following sites will take your insurance:  . [facility] [place] a Provider at this Location  [address] [place], [place] [address] . 10 am to 8 pm Monday-Friday . 12 pm to 8 pm Saturday-Sunday   . [facility] [place] a Provider at this Location   [address], [place], [place] [address] . 8 am to 8 pm Monday-Friday . 9 am to 6 pm Saturday . 11 am to 6 pm Sunday   . [facility] [place] Get Driving Directions  [address] Suite [place], [place] [address] . 8 am to 8 pm Monday-Friday . 8 am to 4 pm Saturday-Sunday   Your e-visit answers were reviewed by a board certified advanced clinical practitioner to complete your personal care plan.  Thank you for using e-Visits.

## 2018-10-10 ENCOUNTER — Other Ambulatory Visit: Payer: Self-pay | Admitting: Family Medicine

## 2018-10-19 ENCOUNTER — Other Ambulatory Visit (HOSPITAL_COMMUNITY): Payer: Self-pay | Admitting: Family Medicine

## 2018-10-19 DIAGNOSIS — Z1231 Encounter for screening mammogram for malignant neoplasm of breast: Secondary | ICD-10-CM

## 2018-10-25 ENCOUNTER — Ambulatory Visit (HOSPITAL_COMMUNITY)
Admission: RE | Admit: 2018-10-25 | Discharge: 2018-10-25 | Disposition: A | Discharge status: Home / Self Care | Payer: BC Managed Care – PPO | Source: Ambulatory Visit | Attending: Family Medicine | Admitting: Family Medicine

## 2018-10-25 ENCOUNTER — Other Ambulatory Visit: Payer: Self-pay

## 2018-10-25 DIAGNOSIS — Z1231 Encounter for screening mammogram for malignant neoplasm of breast: Secondary | ICD-10-CM | POA: Diagnosis not present

## 2018-10-26 ENCOUNTER — Other Ambulatory Visit: Payer: Self-pay | Admitting: Family Medicine

## 2018-10-31 ENCOUNTER — Other Ambulatory Visit: Payer: Self-pay

## 2018-10-31 ENCOUNTER — Ambulatory Visit (INDEPENDENT_AMBULATORY_CARE_PROVIDER_SITE_OTHER): Payer: BC Managed Care – PPO | Admitting: Family Medicine

## 2018-10-31 ENCOUNTER — Encounter: Payer: Self-pay | Admitting: Family Medicine

## 2018-10-31 VITALS — BP 144/64 | HR 62 | Temp 99.0°F | Resp 14 | Ht 67.0 in | Wt 170.0 lb

## 2018-10-31 DIAGNOSIS — R6881 Early satiety: Secondary | ICD-10-CM

## 2018-10-31 DIAGNOSIS — R110 Nausea: Secondary | ICD-10-CM

## 2018-10-31 DIAGNOSIS — R634 Abnormal weight loss: Secondary | ICD-10-CM | POA: Diagnosis not present

## 2018-10-31 DIAGNOSIS — R1013 Epigastric pain: Secondary | ICD-10-CM | POA: Diagnosis not present

## 2018-10-31 NOTE — Progress Notes (Signed)
Subjective:    Patient ID: [name], female    DOB: 10/31/1955, 63 y.o.   MRN: [id number]  HPI  08/28/18 Patient presents today with a burning pain in her stomach.  The burning pain is been present for several days.  It is made worse by food.  She also feels constantly nauseated.  She reports cramping pain in her upper epigastric area.  Past medical history is significant for an EGD in July of last year which showed gastric erosions.  At that time she was placed on twice daily Nexium however she is having burning pain despite taking the Nexium.  She does not drink.  She denies any history of issues with her pancreas.  Her gallbladder was removed in the past.  She denies any melena or hematochezia.  She denies any fevers or chills but she does report poor appetite.  She has not taken the metformin for over a month.  She is lost approximately 3 pounds since January due to her decreasing appetite.  At that time, my plan was: Differential diagnosis includes irritable bowel syndrome, peptic ulcer disease, pancreatitis, etc.  We will add sucralfate 1 g p.o. q. before meals at bedtime and reassess the patient in 2 weeks.  Rule out H. pylori gastritis by obtaining a H. pylori breath test.  Check for pancreatitis with a lipase.  If symptoms persist despite the addition of sucralfate, I would recommend a CT scan of the abdomen and pelvis given her weight loss.  Keep irritable bowel syndrome also on the differential diagnosis  09/19/18 Patient states that the pain in her stomach has improved dramatically since starting sucralfate.  It is much much better and is now just very mild and on occasion.  She is still taking sucralfate 3-4 times a day.  She has been on the medication for approximately 3 weeks.  Her H. pylori breath test and the remainder of her lab work were normal.  The reason she presents today is due to a rash on her back and on her hands.  Roughly the time I saw her last, she was getting her  hair fixed.  She had a "weave" put in her hair.  There were chemicals that she had to wash out of her hair that night.  On her hands where she was ringing her hair out is now an eczema-like papular rash in the webspace between the thumb and the index finger on both sides.  The chemical rinse down in the shower along her back into the gluteal cleft.  On her lower back and into the gluteal cleft is also an erythematous papular rash similar to what is in the webspace between her thumb and index finger.  Is extremely itchy.  She is tried cortisone cream, which hazel, peroxide, and nothing is helping the rash.  It itches.  The rash is located nowhere else on the body other than the areas described.  At that time, my plan was: Abdominal pain is improving dramatically on sucralfate.  Continue the medication for an additional 3 to 4 weeks and then discontinue the medication.  I am presumptively treating the patient for gastritis or possibly a mild ulcer.  If the pain persist or worsens, I would recommend GI consult with EGD.  Second I believe the patient has a chemical dermatitis.  I recommended using triamcinolone cream be applied twice daily to the affected areas for 7 to 10 days.  Recheck via telephone if symptoms do not improve.  10/31/18 Wt Readings from Last 3 Encounters:  10/31/18 170 lb (77.1 kg)  09/19/18 173 lb (78.5 kg)  08/28/18 172 lb (78 kg)   Since I last saw the patient, she has lost 3 pounds.  She states that she has no appetite.  She reports daily nausea.  She continues to have reflux and epigastric discomfort despite taking Nexium 40 mg a day and sucralfate 3 times daily.  She also reports occasional dysphagia with food sticking in her throat when she swallows.  When I asked her to be specific whether it was solid food or liquid food etc. that is sticking she states that the consistency of the food does not really matter he had the food does not actually get stuck but she feels a pressure-like  sensation in her throat when she tries to swallow.  She does have a history of Schatzki's ring status post dilatation on her EGD that was performed last year in July.  At that time she had mild erosive gastritis and a mild Schlotsky's ring but otherwise no findings were concerning.  She states that she has been dealing off and on with the stomach problems for years.  She is on metformin however she states that she has not taken in the last 2 to 3 weeks and therefore it should not be upsetting her stomach.  She denies any fevers or chills.  She denies any diarrhea.  She denies any melena or hematochezia.   Past Medical History:  Diagnosis Date  . Abdominal aortic aneurysm (AAA) 3.0 cm to 5.0 cm in diameter in female [facility])    None seen on prior imaging?   Marland Kitchen Anxiety   . Cataract   . Chronic left shoulder pain   . Headache   . Hiatal hernia    small  . Hyperlipidemia   . Hypertension   . Pre-diabetes   . Prediabetes   . Schatzki's ring   . Tremor    Past Surgical History:  Procedure Laterality Date  . CARDIAC CATHETERIZATION  2005   "Ms. Nine has essentially normal coronary arteries and normal left ventricular function.  She will be treated empirically with anti-reflux measures"  . CATARACT EXTRACTION, BILATERAL    . CHOLECYSTECTOMY    . COLONOSCOPY    06/16/2004   IEP:PIRJJO rectum/Diminutive polyps of the splenic flexure and the cecum/The remainder of the colonic mucosa appeared normal pathology (hyperplastic polyps).  . COLONOSCOPY N/A 08/07/2015   Dr. Gala Romney: 6 mm descending colon tubular adenoma, multiple medium-mouthed diverticula in sigmoid colon. surveillance 2022  . ESOPHAGOGASTRODUODENOSCOPY  09/11/2001   ACZ:[id number] ring otherwise normal esophagus/Normal stomach/Normal D1 and D2 status post passage of a 56 [place] Maloney dilator  . ESOPHAGOGASTRODUODENOSCOPY N/A 11/11/2017   Procedure: ESOPHAGOGASTRODUODENOSCOPY (EGD);  Surgeon: Daneil Dolin, MD;  [facility];  Service: Endoscopy;  Laterality: N/A;  8:15am  . ESOPHAGOGASTRODUODENOSCOPY (EGD) WITH ESOPHAGEAL DILATION N/A 01/25/2013   Dr. Gala Romney- schatzki's ring- 21 F dilation. small hiatal hernia  . MALONEY DILATION N/A 11/11/2017   Procedure: [name] DILATION;  Surgeon: Daneil Dolin, MD;  [facility];  Service: Endoscopy;  Laterality: N/A;  . TUBAL LIGATION     Current Outpatient Medications on File Prior to Visit  Medication Sig Dispense Refill  . ALPRAZolam (XANAX) 0.5 MG tablet TAKE 1 TABLET(0.5 MG) BY MOUTH TWICE DAILY AS NEEDED FOR ANXIETY 60 tablet 0  . amLODipine (NORVASC) 10 MG tablet Take 1 tablet (10 mg total) by mouth at  bedtime. 90 tablet 3  . aspirin EC 81 MG tablet Take 81 mg by mouth every morning.     Marland Kitchen atorvastatin (LIPITOR) 80 MG tablet Take 1 tablet (80 mg total) by mouth daily. 90 tablet 3  . augmented betamethasone dipropionate (DIPROLENE-AF) 0.05 % cream Apply 1 application topically daily as needed.  3  . cetirizine (ZYRTEC) 10 MG tablet Take 10 mg by mouth daily.    Marland Kitchen esomeprazole (NEXIUM) 40 MG capsule Take 1 capsule (40 mg total) by mouth daily. 90 capsule 3  . fluticasone (FLONASE) 50 MCG/ACT nasal spray Place 2 sprays into both nostrils daily. 16 g 6  . hydrochlorothiazide (HYDRODIURIL) 25 MG tablet TAKE 1 TABLET(25 MG) BY MOUTH DAILY 90 tablet 2  . ipratropium (ATROVENT) 0.03 % nasal spray Place 2 sprays into both nostrils every 12 (twelve) hours. 30 mL 12  . linaclotide (LINZESS) 145 MCG CAPS capsule Take 1 capsule (145 mcg total) by mouth daily before breakfast. 30 capsule 3  . losartan (COZAAR) 100 MG tablet Take 1 tablet (100 mg total) by mouth daily. 90 tablet 3  . metFORMIN (GLUCOPHAGE) 500 MG tablet TAKE 1 TABLET(500 MG) BY MOUTH TWICE DAILY WITH A MEAL 180 tablet 0  . metoprolol succinate (TOPROL-XL) 100 MG 24 hr tablet Take 1 tablet (100 mg total) by mouth daily. Take with or immediately following a meal. 90 tablet 3  . mirabegron ER  (MYRBETRIQ) 25 MG TB24 tablet Take 1 tablet (25 mg total) by mouth daily. 30 tablet 1  . montelukast (SINGULAIR) 10 MG tablet TAKE 1 TABLET(10 MG) BY MOUTH AT BEDTIME 30 tablet 3  . potassium chloride SA (K-DUR,KLOR-CON) 20 MEQ tablet Take 20 mEq by mouth daily.    . sucralfate (CARAFATE) 1 g tablet TAKE 1 TABLET(1 GRAM) BY MOUTH FOUR TIMES DAILY AT BEDTIME WITH MEALS 120 tablet 1  . triamcinolone cream (KENALOG) 0.1 % Apply 1 application topically 2 (two) times daily. 45 g 0  . valsartan (DIOVAN) 320 MG tablet TAKE 1 TABLET BY MOUTH EVERY DAY 90 tablet 1  . vitamin B-12 (CYANOCOBALAMIN) 1000 MCG tablet Take 1,000 mcg by mouth daily.    . vitamin C (ASCORBIC ACID) 500 MG tablet Take 1,000 mg by mouth daily.     No current facility-administered medications on file prior to visit.    Allergies  Allergen Reactions  . Ciprofloxacin Shortness Of Breath   Social History   Socioeconomic History  . Marital status: Single    Spouse name: Not on file  . Number of children: 3  . Years of education: Not on file  . Highest education level: Not on file  Occupational History  . 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 Financial resource strain: Not on file  . Food insecurity    Worry: Not on file    Inability: Not on file  . Transportation needs    Medical: Not on file    Non-medical: Not on file  Tobacco Use  . Smoking status: Former Smoker    Packs/day: 0.25    Years: 35.00    Pack years: 8.75    Types: Cigarettes    Quit date: 12/31/2012    Years since quitting: 5.8  . Smokeless tobacco: Never Used  Substance and Sexual Activity  . Alcohol use: Yes    Comment: occasionaly  . Drug use: No  . Sexual activity: Yes    Partners: Male    Birth control/protection: Post-menopausal  Lifestyle  . Physical activity    Days per week: Not on file    Minutes per session: Not on file  . Stress: Not on file  Relationships  . Social Herbalist on  phone: Not on file    Gets together: Not on file    Attends religious service: Not on file    Active member of club or organization: Not on file    Attends meetings of clubs or organizations: Not on file    Relationship status: Not on file  . Intimate partner violence    Fear of current or ex partner: Not on file    Emotionally abused: Not on file    Physically abused: Not on file    Forced sexual activity: Not on file  Other Topics Concern  . Not on file  Social History Narrative   Eats all food groups.    Wears seatbelt.    Has 3 children.   Prior smoker.   Attends church.    Used to work in [employer].    Drives.   Enjoys going out with family.    Family History  Problem Relation Age of Onset  . Diabetes Other   . Diabetes Mother   . Heart disease Mother   . Heart disease Father   . Diabetes Sister   . Diabetes Brother   . Hypertension Brother   . Diabetes Daughter   . Hypertension Maternal Grandmother   . Stroke Maternal Grandfather   . Early death Paternal Grandfather   . Colon cancer Neg Hx   . Liver disease Neg Hx       Review of Systems  All other systems reviewed and are negative.      Objective:   Physical Exam Vitals signs reviewed.  Constitutional:      Appearance: She is well-developed.  Cardiovascular:     Rate and Rhythm: Normal rate and regular rhythm.     Heart sounds: Normal heart sounds. No murmur. No friction rub. No gallop.   Pulmonary:     Effort: Pulmonary effort is normal. No respiratory distress.     Breath sounds: Normal breath sounds. No stridor. No wheezing or rales.  Chest:     Chest wall: No tenderness.  Neurological:     Motor: No abnormal muscle tone.           Assessment & Plan:  The primary encounter diagnosis was Unexplained weight loss. Diagnoses of Epigastric pain, Nausea in adult, Early satiety, and Abnormal weight loss were also pertinent to this visit. Patient has longstanding gastrointestinal issues.  She  states that she wants to get to the bottom of it.  Her reflux did improve on the combination of Nexium and sucralfate however she continues to have some abdominal discomfort on a daily basis.  She has early satiety.  She has weight loss.  She has dysphasia.  She has had an EGD several times including last year that was unremarkable other than what was mentioned in the history of present illness.  Therefore I will begin by obtaining a CT scan of the abdomen and pelvis to evaluate further.  If the CT scan of the abdomen and pelvis is unremarkable, I would then get a second opinion with gastroenterology.  I explained the patient that there is a possibility that she may just have IBS at the symptoms may be chronic.  Rather than trying to cure the symptoms we may have to try to find medications  that can help her tolerate and live with the symptoms.  Patient will be willing to entertain this thought once she has thoroughly ruled out any other possibility.

## 2018-11-01 ENCOUNTER — Other Ambulatory Visit: Payer: Self-pay | Admitting: Family Medicine

## 2018-11-01 DIAGNOSIS — F419 Anxiety disorder, unspecified: Secondary | ICD-10-CM

## 2018-11-01 NOTE — Telephone Encounter (Signed)
Pt is requesting refill on Xanax   LOV: 10/31/18  LRF:   05/09/18

## 2018-11-02 MED ORDER — ALPRAZOLAM 0.5 MG PO TABS: ORAL_TABLET | ORAL | 0 refills | Status: DC

## 2018-11-06 ENCOUNTER — Encounter: Payer: Self-pay | Admitting: Internal Medicine

## 2018-11-06 ENCOUNTER — Ambulatory Visit (INDEPENDENT_AMBULATORY_CARE_PROVIDER_SITE_OTHER): Payer: BC Managed Care – PPO | Admitting: Otolaryngology

## 2018-11-06 DIAGNOSIS — H6123 Impacted cerumen, bilateral: Secondary | ICD-10-CM | POA: Diagnosis not present

## 2018-11-06 DIAGNOSIS — J342 Deviated nasal septum: Secondary | ICD-10-CM | POA: Diagnosis not present

## 2018-11-06 DIAGNOSIS — J343 Hypertrophy of nasal turbinates: Secondary | ICD-10-CM | POA: Diagnosis not present

## 2018-11-06 DIAGNOSIS — E119 Type 2 diabetes mellitus without complications: Secondary | ICD-10-CM | POA: Diagnosis not present

## 2018-11-08 ENCOUNTER — Telehealth: Payer: Self-pay | Admitting: *Deleted

## 2018-11-08 DIAGNOSIS — R6881 Early satiety: Secondary | ICD-10-CM

## 2018-11-08 DIAGNOSIS — R1013 Epigastric pain: Secondary | ICD-10-CM

## 2018-11-08 DIAGNOSIS — K257 Chronic gastric ulcer without hemorrhage or perforation: Secondary | ICD-10-CM

## 2018-11-08 DIAGNOSIS — R634 Abnormal weight loss: Secondary | ICD-10-CM

## 2018-11-08 NOTE — Telephone Encounter (Signed)
Pt CT scan denied, she has had episgatric abd pain, EGD with gastric erosions, early satiey and weight loss of 5 pounds in past 9months due to pain with eating and loss of appetite Has seen GI Needs CT scan, r/o malignancy She has had gallbladder removed

## 2018-11-08 NOTE — Telephone Encounter (Signed)
Received request from pharmacy for [place] [facility] [address].  PA submitted.   Dx: K59.9- constipation.

## 2018-11-08 NOTE — Telephone Encounter (Signed)
Your information has been submitted to [employer]. [employer] is reviewing the PA request and you will receive an electronic response. You may check for the updated outcome later by reopening this request. The standard fax determination will also be sent to you directly.  If you have any questions about your PA submission, contact [employer] at [phone].

## 2018-11-14 ENCOUNTER — Telehealth: Payer: Self-pay | Admitting: Family Medicine

## 2018-11-14 ENCOUNTER — Other Ambulatory Visit: Payer: Self-pay

## 2018-11-14 MED ORDER — POTASSIUM CHLORIDE CRYS ER 20 MEQ PO TBCR: 20.0000 meq | EXTENDED_RELEASE_TABLET | Freq: Every day | ORAL | 5 refills | Status: DC

## 2018-11-14 NOTE — Telephone Encounter (Signed)
Spoke with patient and she stated that she will call her pharmacy to see if they now have valasartan in stock and she will let us know.

## 2018-11-14 NOTE — Telephone Encounter (Signed)
Patient called in stating that her blood pressure medication was switched from Valsartan to Losartan due to her pharmacy not being able to get Valsartan. Since switching her medication to Losartan she has been getting light headed and feeling like she is going to pass out. She checked her BP and it was 158/90. States that with the Valsartan it used to run around 109/54. Was prescribed the Losartan in the past by previous PCP and had same symptoms. Please advise?

## 2018-11-14 NOTE — Telephone Encounter (Signed)
We could switch the patient back to her previous dose of valsartan but she would have to go to a different pharmacy.  Or we could try switching losartan to Benicar 20 mg a day and see if she does better on Benicar than the losartan.  Which 1 would she prefer?

## 2018-11-15 MED ORDER — LINACLOTIDE 145 MCG PO CAPS: 145.0000 ug | ORAL_CAPSULE | Freq: Every day | ORAL | 3 refills | Status: DC

## 2018-11-15 NOTE — Telephone Encounter (Signed)
Received PA determination.   [employer] does not review PA's for [employer].   Re-submitted PA to [employer].

## 2018-11-15 NOTE — Addendum Note (Signed)
Addended by: Sheral Flow on: 11/15/2018 02:29 PM   Modules accepted: Orders

## 2018-11-15 NOTE — Telephone Encounter (Signed)
Your information has been submitted to [place]. [employer] [place] will review the request and fax you a determination directly, typically within 3 business days of your submission once all necessary information is received.  If [employer] has not responded in 3 business days or if you have any questions about your submission, contact [place] at [phone].

## 2018-11-17 MED ORDER — OLMESARTAN MEDOXOMIL 20 MG PO TABS: 20.0000 mg | ORAL_TABLET | Freq: Every day | ORAL | 3 refills | Status: DC

## 2018-11-17 NOTE — Telephone Encounter (Signed)
Medication called/sent to requested pharmacy  

## 2018-11-17 NOTE — Telephone Encounter (Signed)
Pt called and states that [facility] does not have valsartan in stock and would like medication switched as she does not want to go to a different pharmacy.

## 2018-11-17 NOTE — Telephone Encounter (Signed)
benicar 20 poqday

## 2018-11-20 ENCOUNTER — Other Ambulatory Visit: Payer: Self-pay

## 2018-11-20 DIAGNOSIS — I714 Abdominal aortic aneurysm, without rupture, unspecified: Secondary | ICD-10-CM

## 2018-11-20 DIAGNOSIS — I6523 Occlusion and stenosis of bilateral carotid arteries: Secondary | ICD-10-CM

## 2018-11-21 ENCOUNTER — Telehealth (HOSPITAL_COMMUNITY): Payer: Self-pay

## 2018-11-21 NOTE — Telephone Encounter (Signed)
The above patient or their representative was contacted and gave the following answers to these questions:         Do you have any of the following symptoms? NO  Fever                    Cough                   Shortness of breath  Do  you have any of the following other symptoms?    muscle pain         vomiting,        diarrhea        rash         weakness        red eye        abdominal pain         bruising          bruising or bleeding              joint pain           severe headache    Have you been in contact with someone who was or has been sick in the past 2 weeks? No  Yes                 Unsure                         Unable to assess   Does the person that you were in contact with have any of the following symptoms?   Cough         shortness of breath           muscle pain         vomiting,            diarrhea            rash            weakness           fever            red eye           abdominal pain           bruising  or  bleeding                joint pain                severe headache               Have you  or someone you have been in contact with traveled internationally in th last month?       No  If yes, which countries?   Have you  or someone you have been in contact with traveled outside [place] in th last month?  No        If yes, which state and city?   COMMENTS OR ACTION PLAN FOR THIS PATIENT:

## 2018-11-22 ENCOUNTER — Other Ambulatory Visit (HOSPITAL_COMMUNITY): Payer: BC Managed Care – PPO

## 2018-11-22 ENCOUNTER — Encounter (HOSPITAL_COMMUNITY): Payer: BC Managed Care – PPO

## 2018-11-22 ENCOUNTER — Ambulatory Visit (HOSPITAL_COMMUNITY)
Admission: RE | Admit: 2018-11-22 | Discharge: 2018-11-22 | Disposition: A | Discharge status: Home / Self Care | Payer: BC Managed Care – PPO | Source: Ambulatory Visit | Attending: Family | Admitting: Family

## 2018-11-22 ENCOUNTER — Other Ambulatory Visit: Payer: Self-pay

## 2018-11-22 ENCOUNTER — Ambulatory Visit (INDEPENDENT_AMBULATORY_CARE_PROVIDER_SITE_OTHER)
Admission: RE | Admit: 2018-11-22 | Discharge: 2018-11-22 | Disposition: A | Discharge status: Home / Self Care | Payer: BC Managed Care – PPO | Source: Ambulatory Visit | Attending: Family | Admitting: Family

## 2018-11-22 DIAGNOSIS — I6523 Occlusion and stenosis of bilateral carotid arteries: Secondary | ICD-10-CM | POA: Insufficient documentation

## 2018-11-22 DIAGNOSIS — I714 Abdominal aortic aneurysm, without rupture, unspecified: Secondary | ICD-10-CM

## 2018-11-29 ENCOUNTER — Other Ambulatory Visit: Payer: Self-pay

## 2018-11-29 ENCOUNTER — Encounter: Payer: Self-pay | Admitting: Family

## 2018-11-29 ENCOUNTER — Ambulatory Visit (INDEPENDENT_AMBULATORY_CARE_PROVIDER_SITE_OTHER): Payer: BC Managed Care – PPO | Admitting: Family

## 2018-11-29 DIAGNOSIS — I6523 Occlusion and stenosis of bilateral carotid arteries: Secondary | ICD-10-CM | POA: Diagnosis not present

## 2018-11-29 DIAGNOSIS — I714 Abdominal aortic aneurysm, without rupture, unspecified: Secondary | ICD-10-CM

## 2018-11-29 NOTE — Progress Notes (Signed)
Virtual Visit via Telephone Note   I connected with [name] on 11/29/2018 using the [employer] by telephone and verified that I was speaking with the correct person using two identifiers. Patient was located at her home and accompanied by herself. I am located at the [facility] office/clinic.   The limitations of evaluation and management by telemedicine and the availability of in person appointments have been previously discussed with the patient and are documented in the patients chart. The patient expressed understanding and consented to proceed.  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 female with a known small abdominal aortic aneurysm.  She was sent for further evaluation in 2018 after noticing a carotid bruit.   Dr. Donzetta Matters last evaluated pt on 10-21-17.  She takes aspirin and a statin. She has never had a stroke, TIA, or amaurosis.  She denies back pain, but reports an occasional "knot" in her side that is alleviated by rubbing it.   She remains very active.  She retired and spends time with her grandkids. She was a longtime smoker quit in 2014. She does have DM and hypertension.    Past Medical History:  Diagnosis Date  . Abdominal aortic aneurysm (AAA) 3.0 cm to 5.0 cm in diameter in female [facility])    None seen on prior imaging?   Marland Kitchen Anxiety   . Cataract   . Chronic left shoulder pain   . Headache   . Hiatal hernia    small  . Hyperlipidemia   . Hypertension   . Pre-diabetes   . Prediabetes   . Schatzki's ring   . Tremor     Past Surgical History:  Procedure Laterality Date  . CARDIAC CATHETERIZATION  2005   "Ms. Frankland has essentially normal coronary arteries and normal left ventricular function.  She will be treated empirically with anti-reflux measures"  . CATARACT EXTRACTION, BILATERAL    . CHOLECYSTECTOMY    . COLONOSCOPY     06/16/2004   GEX:[id number] rectum/Diminutive polyps of the splenic flexure and the cecum/The remainder of the colonic mucosa appeared normal pathology (hyperplastic polyps).  . COLONOSCOPY N/A 08/07/2015   Dr. Gala Romney: 6 mm descending colon tubular adenoma, multiple medium-mouthed diverticula in sigmoid colon. surveillance 2022  . ESOPHAGOGASTRODUODENOSCOPY  09/11/2001   KGM:[id number] ring otherwise normal esophagus/Normal stomach/Normal D1 and D2 status post passage of a 56 [place] Maloney dilator  . ESOPHAGOGASTRODUODENOSCOPY N/A 11/11/2017   Procedure: ESOPHAGOGASTRODUODENOSCOPY (EGD);  Surgeon: Daneil Dolin, MD;  [facility];  Service: Endoscopy;  Laterality: N/A;  8:15am  . ESOPHAGOGASTRODUODENOSCOPY (EGD) WITH ESOPHAGEAL DILATION N/A 01/25/2013   Dr. Gala Romney- schatzki's ring- 50 F dilation. small hiatal hernia  . MALONEY DILATION N/A 11/11/2017   Procedure: [name] DILATION;  Surgeon: Daneil Dolin, MD;  [facility];  Service: Endoscopy;  Laterality: N/A;  . TUBAL LIGATION      Current Meds  Medication Sig  . ALPRAZolam (XANAX) 0.5 MG tablet TAKE 1 TABLET(0.5 MG) BY MOUTH TWICE DAILY AS NEEDED FOR ANXIETY  . amLODipine (NORVASC) 10 MG tablet Take 1 tablet (10 mg total) by mouth at bedtime.  Marland Kitchen aspirin EC 81 MG tablet Take 81 mg by mouth every morning.   Marland Kitchen atorvastatin (LIPITOR) 80 MG tablet Take 1 tablet (80 mg total) by mouth daily.  Marland Kitchen augmented betamethasone dipropionate (DIPROLENE-AF) 0.05 % cream  Apply 1 application topically daily as needed.  . cetirizine (ZYRTEC) 10 MG tablet Take 10 mg by mouth daily.  Marland Kitchen esomeprazole (NEXIUM) 40 MG capsule Take 1 capsule (40 mg total) by mouth daily.  . fluticasone (FLONASE) 50 MCG/ACT nasal spray Place 2 sprays into both nostrils daily.  . hydrochlorothiazide (HYDRODIURIL) 25 MG tablet TAKE 1 TABLET(25 MG) BY MOUTH DAILY  . ipratropium (ATROVENT) 0.03 % nasal spray Place 2 sprays into both nostrils every 12 (twelve) hours.  Marland Kitchen  linaclotide (LINZESS) 145 MCG CAPS capsule Take 1 capsule (145 mcg total) by mouth daily before breakfast.  . metFORMIN (GLUCOPHAGE) 500 MG tablet TAKE 1 TABLET(500 MG) BY MOUTH TWICE DAILY WITH A MEAL  . metoprolol succinate (TOPROL-XL) 100 MG 24 hr tablet Take 1 tablet (100 mg total) by mouth daily. Take with or immediately following a meal.  . mirabegron ER (MYRBETRIQ) 25 MG TB24 tablet Take 1 tablet (25 mg total) by mouth daily.  . montelukast (SINGULAIR) 10 MG tablet TAKE 1 TABLET(10 MG) BY MOUTH AT BEDTIME  . olmesartan (BENICAR) 20 MG tablet Take 1 tablet (20 mg total) by mouth daily.  . potassium chloride SA (K-DUR) 20 MEQ tablet Take 1 tablet (20 mEq total) by mouth daily.  . sucralfate (CARAFATE) 1 g tablet TAKE 1 TABLET(1 GRAM) BY MOUTH FOUR TIMES DAILY AT BEDTIME WITH MEALS  . triamcinolone cream (KENALOG) 0.1 % Apply 1 application topically 2 (two) times daily.  . vitamin B-12 (CYANOCOBALAMIN) 1000 MCG tablet Take 1,000 mcg by mouth daily.  . vitamin C (ASCORBIC ACID) 500 MG tablet Take 1,000 mg by mouth daily.    12 system ROS was negative unless otherwise noted in HPI   Observations/Objective:  DATA  Carotid Duplex (11-22-18); Right Carotid: Velocities in the right ICA are consistent with a 40-59% stenosis. Left Carotid: Velocities in the left ICA are consistent with a 1-39% stenosis. Vertebrals:  Bilateral vertebral arteries demonstrate antegrade flow. Subclavians: Normal flow hemodynamics were seen in bilateral subclavian arteries.  AAA Duplex (11-22-18): Abdominal Aorta: There is evidence of abnormal dilitation of the Proximal Abdominal aorta. The largest aortic measurement is 2.6 cm.    Assessment and Plan: She has a very small AAA at 2.6 cm. Carotid duplex shows 40-59% stenosis in the right ICA, and 1-39% stenosis in the left ICA.  She has no history of stroke or TIA.    - "knot" feeling in her sie that resolves with rubbing it: I advised her to discuss this with  her PCP.   Follow Up Instructions:   Follow up in a year with carotid duplex, and AAA duplex in 2 years.    I discussed the assessment and treatment plan with the patient. The patient was provided an opportunity to ask questions and all were answered. The patient agreed with the plan and demonstrated an understanding of the instructions.   The patient was advised to call back or seek an in-person evaluation if the symptoms worsen or if the condition fails to improve as anticipated.  I spent 5 minutes with the patient via telephone encounter.   Gabrielle Dare [facility] Office: [phone]  11/29/2018, 5:59 PM

## 2018-11-30 ENCOUNTER — Other Ambulatory Visit: Payer: Self-pay

## 2018-11-30 ENCOUNTER — Ambulatory Visit (HOSPITAL_COMMUNITY)
Admission: RE | Admit: 2018-11-30 | Discharge: 2018-11-30 | Disposition: A | Discharge status: Home / Self Care | Payer: BC Managed Care – PPO | Source: Ambulatory Visit | Attending: Family Medicine | Admitting: Family Medicine

## 2018-11-30 DIAGNOSIS — K257 Chronic gastric ulcer without hemorrhage or perforation: Secondary | ICD-10-CM | POA: Diagnosis not present

## 2018-11-30 DIAGNOSIS — R6881 Early satiety: Secondary | ICD-10-CM | POA: Insufficient documentation

## 2018-11-30 DIAGNOSIS — R1013 Epigastric pain: Secondary | ICD-10-CM

## 2018-11-30 DIAGNOSIS — R634 Abnormal weight loss: Secondary | ICD-10-CM | POA: Diagnosis not present

## 2018-11-30 DIAGNOSIS — K5730 Diverticulosis of large intestine without perforation or abscess without bleeding: Secondary | ICD-10-CM | POA: Diagnosis not present

## 2018-11-30 DIAGNOSIS — N281 Cyst of kidney, acquired: Secondary | ICD-10-CM | POA: Diagnosis not present

## 2018-11-30 LAB — POCT I-STAT CREATININE: Creatinine, Ser: 0.7 mg/dL (ref 0.44–1.00)

## 2018-11-30 MED ORDER — IOHEXOL 300 MG/ML  SOLN
100.0000 mL | Freq: Once | INTRAMUSCULAR | Status: AC | PRN
  Administered 2018-11-30: 100 mL via INTRAVENOUS

## 2018-11-30 MED ORDER — BARIUM SULFATE 2.1 % PO SUSP
ORAL | Status: AC
  Filled 2018-11-30: qty 2

## 2018-12-01 ENCOUNTER — Telehealth: Payer: Self-pay

## 2018-12-01 DIAGNOSIS — R1013 Epigastric pain: Secondary | ICD-10-CM

## 2018-12-01 NOTE — Telephone Encounter (Signed)
Error

## 2018-12-04 ENCOUNTER — Other Ambulatory Visit: Payer: Self-pay | Admitting: Family Medicine

## 2018-12-04 DIAGNOSIS — R110 Nausea: Secondary | ICD-10-CM

## 2018-12-04 DIAGNOSIS — R6881 Early satiety: Secondary | ICD-10-CM

## 2018-12-04 DIAGNOSIS — R634 Abnormal weight loss: Secondary | ICD-10-CM

## 2018-12-04 DIAGNOSIS — R1013 Epigastric pain: Secondary | ICD-10-CM

## 2018-12-06 ENCOUNTER — Ambulatory Visit: Payer: BC Managed Care – PPO | Admitting: Gastroenterology

## 2018-12-06 ENCOUNTER — Other Ambulatory Visit: Payer: Self-pay

## 2018-12-06 ENCOUNTER — Encounter: Payer: Self-pay | Admitting: Gastroenterology

## 2018-12-06 VITALS — BP 143/78 | HR 62 | Temp 97.1°F | Ht 67.0 in | Wt 172.2 lb

## 2018-12-06 DIAGNOSIS — R634 Abnormal weight loss: Secondary | ICD-10-CM

## 2018-12-06 DIAGNOSIS — K5900 Constipation, unspecified: Secondary | ICD-10-CM

## 2018-12-06 DIAGNOSIS — R1013 Epigastric pain: Secondary | ICD-10-CM

## 2018-12-06 DIAGNOSIS — K219 Gastro-esophageal reflux disease without esophagitis: Secondary | ICD-10-CM | POA: Diagnosis not present

## 2018-12-06 DIAGNOSIS — R110 Nausea: Secondary | ICD-10-CM

## 2018-12-06 MED ORDER — PANTOPRAZOLE SODIUM 40 MG PO TBEC: 40.0000 mg | DELAYED_RELEASE_TABLET | Freq: Two times a day (BID) | ORAL | 5 refills | Status: DC

## 2018-12-06 NOTE — Progress Notes (Signed)
Referring Provider: Susy Frizzle, MD Primary Care Physician:  Susy Frizzle, MD Primary GI Physician: Dr. Gala Romney  Chief Complaint  Patient presents with   Nausea   Dysphagia    occ   abdominal burning    HPI:   [name] is a 62 y.o. female with GI history significant for GERD. EGD 11/11/17 for dysphagia: Mild Schatzki ring. Dilated. Erosive gastropathy - mild. Otherwise normal. Colonoscopy in 2017. Due for repeat in 2022. She presents today due to epigastric burning that started about 3 months ago despite Nexium BID. Also with worsening reflux, heartburn, and nausea at that time. Patient has seen PCP several times over the last few months due to this.  In April, PCP added Carafate 1 g before meals to patients Nexium 20 mg BID. Follow-up in May, she had some improvement but continued with occasional symptoms. Subsequently in June, she had a 3 lb weight loss compared to prior visit and patient reported daily nausea, regular reflux and epigastric pain. Also with occasional dysphagia. PCP ordered CT abdomen and pelvis that was without acute findings. See Impression below in studies. Referred to GI for further evaluation.  Today she states about 3 months ago, she felt like her stomach was on fire. It was present daily. She was having heartburn, acid reflux, and nausea daily but no vomiting. At this time she is still taking Carafate along with her Nexium BID and states her symptoms have improved. Epigastric pain/burning has resolved. Heartburn/reflux maybe once a week. Nausea about 3 times a week. Nausea not associated with meals. Comes and goes within 10 minutes. Sometimes feels like she can't swallow well. Bread or course meats feel like they will stick in the mid esophagus at times. Eventually, they will move on down. No regurgitation of foods. States this is not nearly as bad as it was prior to her last dilation. Soft foods and liquids cause no problem. No hematochezia or  melena. Patient states she doesn't have a great appetite, but this was present prior to the onset of this acute worsening of symptoms. States when she was diagnosed with fatty liver about 1 year ago, she completely changed her diet. She is concerned about eating and making fatty liver or diabetes worse. Thinks this impacts her appetite. No further weight loss since June. Has actually gained 2 lbs. She does report occasional early satiety, but states this is not new and has been present for years.    Occasional use of tylenol. Daily 81 mg aspirin. Occasionally will take 325 mg aspirin.  Denies any changes prior to the on start of this episode. No medication changes, no increase in stress in her life.   Takes Linzess 145 mg daily which is keeping her bowels moving well. No constipation or diarrhea. Reports occasional "quiver" in her lower abdomen when she gets nervous. No lower abdominal pain.   Denies fever, chills, fatigue, confusion, skin color changes, swelling in abdomen or lower extremities, chest pain, palpitations, shortness of breath, or cough. Admits to occasional lightheadedness/dizziness when her BP medication was changed, but this has improved as she has continued to take the medication.    Past Medical History:  Diagnosis Date   Abdominal aortic aneurysm (AAA) 3.0 cm to 5.0 cm in diameter in female [facility])    None seen on prior imaging?    Anxiety    Cataract    Chronic left shoulder pain    Headache    Hiatal hernia  small   Hyperlipidemia    Hypertension    Pre-diabetes    Prediabetes    Schatzki's ring    Tremor     Past Surgical History:  Procedure Laterality Date   CARDIAC CATHETERIZATION  2005   "Ms. Lysne has essentially normal coronary arteries and normal left ventricular function.  She will be treated empirically with anti-reflux measures"   CATARACT EXTRACTION, BILATERAL     CHOLECYSTECTOMY     COLONOSCOPY    06/16/2004   [id number]  rectum/Diminutive polyps of the splenic flexure and the cecum/The remainder of the colonic mucosa appeared normal pathology (hyperplastic polyps).   COLONOSCOPY N/A 08/07/2015   Dr. Gala Romney: 6 mm descending colon tubular adenoma, multiple medium-mouthed diverticula in sigmoid colon. surveillance 2022   ESOPHAGOGASTRODUODENOSCOPY  09/11/2001   [id number] ring otherwise normal esophagus/Normal stomach/Normal D1 and D2 status post passage of a 56 [place] Maloney dilator   ESOPHAGOGASTRODUODENOSCOPY N/A 11/11/2017   Procedure: ESOPHAGOGASTRODUODENOSCOPY (EGD);  Surgeon: Daneil Dolin, MD;  [facility];  Service: Endoscopy;  Laterality: N/A;  8:15am   ESOPHAGOGASTRODUODENOSCOPY (EGD) WITH ESOPHAGEAL DILATION N/A 01/25/2013   Dr. Gala Romney- schatzki's ring- 60 F dilation. small hiatal hernia   MALONEY DILATION N/A 11/11/2017   Procedure: [name] DILATION;  Surgeon: Daneil Dolin, MD;  Location: [facility];  Service: Endoscopy;  Laterality: N/A;   TUBAL LIGATION      Current Outpatient Medications  Medication Sig Dispense Refill   ALPRAZolam (XANAX) 0.5 MG tablet TAKE 1 TABLET(0.5 MG) BY MOUTH TWICE DAILY AS NEEDED FOR ANXIETY 60 tablet 0   amLODipine (NORVASC) 10 MG tablet Take 1 tablet (10 mg total) by mouth at bedtime. 90 tablet 3   aspirin EC 81 MG tablet Take 81 mg by mouth every morning.      augmented betamethasone dipropionate (DIPROLENE-AF) 0.05 % cream Apply 1 application topically daily as needed.  3   cetirizine (ZYRTEC) 10 MG tablet Take 10 mg by mouth daily.     esomeprazole (NEXIUM) 40 MG capsule Take 1 capsule (40 mg total) by mouth daily. 90 capsule 3   fluticasone (FLONASE) 50 MCG/ACT nasal spray Place 2 sprays into both nostrils daily. 16 g 6   hydrochlorothiazide (HYDRODIURIL) 25 MG tablet TAKE 1 TABLET(25 MG) BY MOUTH DAILY 90 tablet 2   ipratropium (ATROVENT) 0.03 % nasal spray Place 2 sprays into both nostrils every 12 (twelve) hours. 30 mL 12    linaclotide (LINZESS) 145 MCG CAPS capsule Take 1 capsule (145 mcg total) by mouth daily before breakfast. 30 capsule 3   metoprolol succinate (TOPROL-XL) 100 MG 24 hr tablet Take 1 tablet (100 mg total) by mouth daily. Take with or immediately following a meal. 90 tablet 3   montelukast (SINGULAIR) 10 MG tablet TAKE 1 TABLET(10 MG) BY MOUTH AT BEDTIME 30 tablet 3   potassium chloride SA (K-DUR) 20 MEQ tablet Take 1 tablet (20 mEq total) by mouth daily. 30 tablet 5   sucralfate (CARAFATE) 1 g tablet TAKE 1 TABLET(1 GRAM) BY MOUTH FOUR TIMES DAILY AT BEDTIME WITH MEALS 120 tablet 1   triamcinolone cream (KENALOG) 0.1 % Apply 1 application topically 2 (two) times daily. 45 g 0   vitamin B-12 (CYANOCOBALAMIN) 1000 MCG tablet Take 1,000 mcg by mouth daily.     vitamin C (ASCORBIC ACID) 500 MG tablet Take 1,000 mg by mouth daily.     atorvastatin (LIPITOR) 80 MG tablet TAKE 1 TABLET BY MOUTH DAILY 90 tablet 3  olmesartan (BENICAR) 20 MG tablet Take 1 tablet (20 mg total) by mouth daily. (Patient not taking: Reported on 12/06/2018) 90 tablet 3   pantoprazole (PROTONIX) 40 MG tablet Take 1 tablet (40 mg total) by mouth 2 (two) times daily. Take 30 minutes before meals 60 tablet 5   No current facility-administered medications for this visit.     Allergies as of 12/06/2018 - Review Complete 12/06/2018  Allergen Reaction Noted   Ciprofloxacin Shortness Of Breath 12/27/2011    Family History  Problem Relation Age of Onset   Diabetes Other    Diabetes Mother    Heart disease Mother    Heart disease Father    Diabetes Sister    Diabetes Brother    Hypertension Brother    Diabetes Daughter    Hypertension Maternal Grandmother    Stroke Maternal Grandfather    Early death Paternal Grandfather    Colon cancer Neg Hx    Liver disease Neg Hx     Social History   Socioeconomic History   Marital status: Single    Spouse name: Not on file   Number of children: 3    Years of education: Not on file   Highest education level: Not on file  Occupational History   Occupation: English as a second language teacher: [employer]  Social Needs   Financial resource strain: Not on file   Food insecurity    Worry: Not on file    Inability: Not on file   Transportation needs    Medical: Not on file    Non-medical: Not on file  Tobacco Use   Smoking status: Former Smoker    Packs/day: 0.25    Years: 35.00    Pack years: 8.75    Types: Cigarettes    Quit date: 12/31/2012    Years since quitting: 5.9   Smokeless tobacco: Never Used  Substance and Sexual Activity   Alcohol use: Not Currently    Comment: occasionaly   Drug use: No   Sexual activity: Yes    Partners: Male    Birth control/protection: Post-menopausal  Lifestyle   Physical activity    Days per week: Not on file    Minutes per session: Not on file   Stress: Not on file  Relationships   Social connections    Talks on phone: Not on file    Gets together: Not on file    Attends religious service: Not on file    Active member of club or organization: Not on file    Attends meetings of clubs or organizations: Not on file    Relationship status: Not on file  Other Topics Concern   Not on file  Social History Narrative   Eats all food groups.    Wears seatbelt.    Has 3 children.   Prior smoker.   Attends church.    Used to work in [employer].    Drives.   Enjoys going out with family.     Review of Systems: Gen: See HPI CV: See HPI Resp: See HPI GI: See HPI Psych: Denies depression. Admits anxiety Heme: Denies bruising, bleeding  Physical Exam: BP (!) 143/78    Pulse 62    Temp (!) 97.1 F (36.2 C) (Temporal)    Ht _0  (1.702 m)    Wt 172 lb 3.2 oz (78.1 kg)    BMI 26.97 kg/m  General:   Alert and oriented. No distress noted. Pleasant and cooperative.  Head:  Normocephalic  and atraumatic. Eyes:  Conjuctiva clear without scleral icterus. Heart:  S1, S2 present  without murmurs appreciated. Lungs:  Clear to auscultation bilaterally. No wheezes, rales, or rhonchi. No distress.  Abdomen:  +BS, soft, non-tender and non-distended. No rebound or guarding. No HSM or masses noted. Msk:  Symmetrical without gross deformities. Normal posture. Extremities:  Without edema. Neurologic:  Alert and  oriented x4 Psych:  Normal mood and affect.  Labs: 08/28/18  Lipase 9  CBC: Hemoglobin 13.2, platelets 294  CMP: Glucose 110, ALT 30, AST 21, Alk Phos 85, bilirubin 1.0, K 3.7, sodium 136  H. Pylori breath test: Negative   Studies: CT abdomen and pelvis 11/30/18 Impression:  1.  No acute findings in the abdomen/pelvis.  2. Somewhat prominent low-attenuation over the endometrium in this postmenopausal patient. Recommend pelvic ultrasound on elective basis for further evaluation.  3. 5.4 cm right renal cyst and several other smaller sub subcentimeter bilateral renal cortical hypodensities too small to characterize but likely cysts.  4.  Colonic diverticulosis.  5.  Aortic Atherosclerosis (ICD10-I70.0).

## 2018-12-06 NOTE — Patient Instructions (Addendum)
Please stop taking Nexium and start taking Protonix 40 mg twice daily 30 minutes before meals.   You can finish out the Carafate that your primary care has already prescribed.   We will see you back in 2 months. Please call in 2 weeks to let me know how you are doing. Also, if you have worsening symptoms before your next visit, please call.   [name], PA-C [facility]

## 2018-12-08 ENCOUNTER — Other Ambulatory Visit: Payer: Self-pay | Admitting: Family Medicine

## 2018-12-08 ENCOUNTER — Ambulatory Visit (HOSPITAL_COMMUNITY)
Admission: RE | Admit: 2018-12-08 | Discharge: 2018-12-08 | Disposition: A | Discharge status: Home / Self Care | Payer: BC Managed Care – PPO | Source: Ambulatory Visit | Attending: Family Medicine | Admitting: Family Medicine

## 2018-12-08 ENCOUNTER — Other Ambulatory Visit: Payer: Self-pay

## 2018-12-08 DIAGNOSIS — R6881 Early satiety: Secondary | ICD-10-CM | POA: Insufficient documentation

## 2018-12-08 DIAGNOSIS — R110 Nausea: Secondary | ICD-10-CM

## 2018-12-08 DIAGNOSIS — R634 Abnormal weight loss: Secondary | ICD-10-CM | POA: Diagnosis not present

## 2018-12-08 DIAGNOSIS — R1013 Epigastric pain: Secondary | ICD-10-CM | POA: Diagnosis not present

## 2018-12-08 DIAGNOSIS — N959 Unspecified menopausal and perimenopausal disorder: Secondary | ICD-10-CM | POA: Diagnosis not present

## 2018-12-08 NOTE — Assessment & Plan Note (Addendum)
Well controlled at this time with Linzess 145 mg daily. Occasional "quiver" in her lower abdomen. Feels it when she is nervous. No hematochezia or melena. Colonoscopy up to date. Due for repeat in 2022.

## 2018-12-08 NOTE — Assessment & Plan Note (Signed)
Addressed under GERD 

## 2018-12-08 NOTE — Assessment & Plan Note (Addendum)
Addressed under GERD 

## 2018-12-08 NOTE — Assessment & Plan Note (Addendum)
Over the last year patient has lost about 8 lbs. States she doesn't have a great appetite, but states she changed her diet completely after she was told she had fatty liver about 1 year ago. She does have intermittent nausea as described above, but this is starting to improve as her epigastric burning and reflux are improving. Occasional early satiety for years. She has actually gained 2 lbs since June. Colonoscopy in 2017 with single tubular adenoma. Due for repeat in 2022. No lower GI alarm symptoms. EGD July 2019 with erosive gastropathy and mild Schatzki ring s/p dilation. Recent CT abdomen and pelvis July 2020 without acute findings. Somewhat prominent low-attenuation over endometrium was identified. She is scheduled for [place] follow-up.   At this time, will continue to follow this along. I have changed her PPI. Hopefully this will help with intermittent nausea/reflux symptoms as described above. Could consider gastric emptying study in the future if symptoms persist and weight continues to drop.  Follow-up in 2 months

## 2018-12-08 NOTE — Assessment & Plan Note (Addendum)
63 y.o. female with history of GERD who had been plaved on Nexium 20 mg BID after EGD in July 2019 with findings of Schatzki ring s/p dilation and mild erosive gastropathy. Patient had new onset constant epigastric burning in April with associated worsening of GERD symptoms and more frequent nausea. She has since had Carafate added to her PPI regimen and her epigastric burning has resolved. GERD and nausea improved, but continues with intermittent symptoms. Had 3 lb weight loss between May and June, but has since gained 2 lbs back. Dysphagia occasionally to course meats and bread. No nearly as bad as prior to her esophageal dilation in 2019. Daily 81 mg aspirin, occasionally 325 mg. No other NSAIDs. No hematochezia, no melena. No vomiting. Benign abdominal exam today. Labs in April 2020 with normal lipase, CBC within normal limits, CMP largely normal. Glucose 110. H. Pylori breath test negative.    Suspect gastritis or PUD secondary to possible loss of effect of Nexium and influence of daily aspirin use. Doubt underlying malignancy. As patient has had significant improvement with medication management and EGD only 1 year ago, will change Nexium to Protonix 40 mg twice daily before meals and follow-up in 2 months. If she continues to have any symptoms, will have low threshold for repeat EGD. Also, if dysphagia worsens, she may need EGD with dilation anyway.

## 2018-12-11 ENCOUNTER — Other Ambulatory Visit: Payer: Self-pay | Admitting: Family Medicine

## 2018-12-11 DIAGNOSIS — R9389 Abnormal findings on diagnostic imaging of other specified body structures: Secondary | ICD-10-CM

## 2018-12-11 NOTE — Progress Notes (Signed)
cc'ed to pcp °

## 2018-12-18 ENCOUNTER — Other Ambulatory Visit: Payer: Self-pay | Admitting: Obstetrics and Gynecology

## 2018-12-18 ENCOUNTER — Ambulatory Visit (INDEPENDENT_AMBULATORY_CARE_PROVIDER_SITE_OTHER): Payer: BC Managed Care – PPO | Admitting: Obstetrics and Gynecology

## 2018-12-18 ENCOUNTER — Encounter: Payer: Self-pay | Admitting: Obstetrics and Gynecology

## 2018-12-18 ENCOUNTER — Other Ambulatory Visit: Payer: Self-pay

## 2018-12-18 VITALS — BP 171/79 | HR 74 | Ht 67.0 in | Wt 173.4 lb

## 2018-12-18 DIAGNOSIS — R9389 Abnormal findings on diagnostic imaging of other specified body structures: Secondary | ICD-10-CM | POA: Diagnosis not present

## 2018-12-18 DIAGNOSIS — N840 Polyp of corpus uteri: Secondary | ICD-10-CM | POA: Diagnosis not present

## 2018-12-18 MED ORDER — DOXYCYCLINE HYCLATE 100 MG PO CAPS: 100.0000 mg | ORAL_CAPSULE | Freq: Two times a day (BID) | ORAL | 0 refills | Status: DC

## 2018-12-18 NOTE — Progress Notes (Addendum)
Patient ID: [name], female   DOB: Mar 29, 1956, 63 y.o.   MRN: [id number]    [facility] Visit  @DATE @            Patient name: [name] MRN [id number]  Date of birth: 1955-11-05  CC & HPI:  [name] is a 63 y.o. female NEW GYN presenting today for endometrial thickening. Intermittent bleeding since 2019 Recent TV u/s done on 12/08/2018 showed:  1. Positive for 14 mm thick heterogeneous endometrium, abnormal for an asymptomatic post-menopausal female. Endometrial sampling should  be considered to exclude carcinoma. 2. Otherwise negative uterus.  Negative ovaries.  No free fluid.  ROS:  ROS +thickened endometrium   Pertinent History Reviewed:   Reviewed:  Medical         Past Medical History:  Diagnosis Date  . Abdominal aortic aneurysm (AAA) 3.0 cm to 5.0 cm in diameter in female [facility])    None seen on prior imaging?   Marland Kitchen Anxiety   . Cataract   . Chronic left shoulder pain   . Headache   . Hiatal hernia    small  . Hyperlipidemia   . Hypertension   . Pre-diabetes   . Prediabetes   . Schatzki's ring   . Tremor                               Surgical Hx:    Past Surgical History:  Procedure Laterality Date  . CARDIAC CATHETERIZATION  2005   "Ms. Ramakrishnan has essentially normal coronary arteries and normal left ventricular function.  She will be treated empirically with anti-reflux measures"  . CATARACT EXTRACTION, BILATERAL    . CHOLECYSTECTOMY    . COLONOSCOPY    06/16/2004   [id number] rectum/Diminutive polyps of the splenic flexure and the cecum/The remainder of the colonic mucosa appeared normal pathology (hyperplastic polyps).  . COLONOSCOPY N/A 08/07/2015   Dr. Gala Romney: 6 mm descending colon tubular adenoma, multiple medium-mouthed diverticula in sigmoid colon. surveillance 2022  . ESOPHAGOGASTRODUODENOSCOPY  09/11/2001   HEN:[id number] ring otherwise normal esophagus/Normal stomach/Normal D1 and D2 status post passage of a 56  [place] Maloney dilator  . ESOPHAGOGASTRODUODENOSCOPY N/A 11/11/2017   Procedure: ESOPHAGOGASTRODUODENOSCOPY (EGD);  Surgeon: Daneil Dolin, MD;  [facility];  Service: Endoscopy;  Laterality: N/A;  8:15am  . ESOPHAGOGASTRODUODENOSCOPY (EGD) WITH ESOPHAGEAL DILATION N/A 01/25/2013   Dr. Gala Romney- schatzki's ring- 78 F dilation. small hiatal hernia  . MALONEY DILATION N/A 11/11/2017   Procedure: [name] DILATION;  Surgeon: Daneil Dolin, MD;  [facility];  Service: Endoscopy;  Laterality: N/A;  . TUBAL LIGATION     Medications: Reviewed & Updated - see associated section                       Current Outpatient Medications:  .  ALPRAZolam (XANAX) 0.5 MG tablet, TAKE 1 TABLET(0.5 MG) BY MOUTH TWICE DAILY AS NEEDED FOR ANXIETY, Disp: 60 tablet, Rfl: 0 .  amLODipine (NORVASC) 10 MG tablet, Take 1 tablet (10 mg total) by mouth at bedtime., Disp: 90 tablet, Rfl: 3 .  aspirin EC 81 MG tablet, Take 81 mg by mouth every morning. , Disp: , Rfl:  .  atorvastatin (LIPITOR) 80 MG tablet, TAKE 1 TABLET BY MOUTH DAILY, Disp: 90 tablet, Rfl: 3 .  augmented betamethasone dipropionate (DIPROLENE-AF) 0.05 % cream, Apply 1 application topically daily as  needed., Disp: , Rfl: 3 .  cetirizine (ZYRTEC) 10 MG tablet, Take 10 mg by mouth daily., Disp: , Rfl:  .  esomeprazole (NEXIUM) 40 MG capsule, Take 1 capsule (40 mg total) by mouth daily., Disp: 90 capsule, Rfl: 3 .  fluticasone (FLONASE) 50 MCG/ACT nasal spray, Place 2 sprays into both nostrils daily., Disp: 16 g, Rfl: 6 .  hydrochlorothiazide (HYDRODIURIL) 25 MG tablet, TAKE 1 TABLET(25 MG) BY MOUTH DAILY, Disp: 90 tablet, Rfl: 2 .  ipratropium (ATROVENT) 0.03 % nasal spray, Place 2 sprays into both nostrils every 12 (twelve) hours., Disp: 30 mL, Rfl: 12 .  linaclotide (LINZESS) 145 MCG CAPS capsule, Take 1 capsule (145 mcg total) by mouth daily before breakfast., Disp: 30 capsule, Rfl: 3 .  metoprolol succinate (TOPROL-XL) 100 MG 24 hr  tablet, Take 1 tablet (100 mg total) by mouth daily. Take with or immediately following a meal., Disp: 90 tablet, Rfl: 3 .  montelukast (SINGULAIR) 10 MG tablet, TAKE 1 TABLET(10 MG) BY MOUTH AT BEDTIME, Disp: 30 tablet, Rfl: 3 .  olmesartan (BENICAR) 20 MG tablet, Take 1 tablet (20 mg total) by mouth daily. (Patient not taking: Reported on 12/06/2018), Disp: 90 tablet, Rfl: 3 .  pantoprazole (PROTONIX) 40 MG tablet, Take 1 tablet (40 mg total) by mouth 2 (two) times daily. Take 30 minutes before meals, Disp: 60 tablet, Rfl: 5 .  potassium chloride SA (K-DUR) 20 MEQ tablet, Take 1 tablet (20 mEq total) by mouth daily., Disp: 30 tablet, Rfl: 5 .  sucralfate (CARAFATE) 1 g tablet, TAKE 1 TABLET(1 GRAM) BY MOUTH FOUR TIMES DAILY AT BEDTIME WITH MEALS, Disp: 120 tablet, Rfl: 1 .  triamcinolone cream (KENALOG) 0.1 %, Apply 1 application topically 2 (two) times daily., Disp: 45 g, Rfl: 0 .  vitamin B-12 (CYANOCOBALAMIN) 1000 MCG tablet, Take 1,000 mcg by mouth daily., Disp: , Rfl:  .  vitamin C (ASCORBIC ACID) 500 MG tablet, Take 1,000 mg by mouth daily., Disp: , Rfl:    Social History: Reviewed -  reports that she quit smoking about 5 years ago. Her smoking use included cigarettes. She has a 8.75 pack-year smoking history. She has never used smokeless tobacco.  Objective Findings:  Vitals: There were no vitals taken for this visit.  PHYSICAL EXAMINATION General appearance - alert, well appearing, and in no distress Mental status - alert, oriented to person, place, and time, normal mood, behavior, speech, dress, motor activity, and thought processes, affect appropriate to mood   PELVIC Discussion of u/s results   Vagina- brown & yellowish discharge  Patient given informed consent, signed copy in the chart, time out was performed. Appropriate time out taken. . The patient was placed in the lithotomy position and the cervix brought into view with sterile speculum.  Portio of cervix cleansed x 2 with  betadine swabs.  A tenaculum was placed in the anterior lip of the cervix.  The uterus was sounded for depth of 8.5 cm. A pipelle was introduced to into the uterus, suction created,  and an endometrial sample was obtained.on several passes. Pyometra suspected as tissue/fluid sample was yellowish . All equipment was removed and accounted for.  The patient tolerated the procedure well.   Patient given post procedure instructions. The patient will return in 2 weeks for results.   Assessment & Plan:   A:  1. Thickened endometrium 2. Endo BX done  P:  1.  F/u with results phone call 2. Doxycycline 100 mg BID x 7 days  By signing my name below, I, Samul Dada, attest that this documentation has been prepared under the direction and in the presence of Jonnie Kind, MD. Electronically Signed: Herman. 12/18/18. 3:58 PM.  I personally performed the services described in this documentation, which was SCRIBED in my presence. The recorded information has been reviewed and considered accurate. It has been edited as necessary during review. Jonnie Kind, MD

## 2018-12-21 ENCOUNTER — Encounter: Payer: Self-pay | Admitting: Family Medicine

## 2018-12-21 ENCOUNTER — Other Ambulatory Visit: Payer: Self-pay

## 2018-12-21 ENCOUNTER — Ambulatory Visit (INDEPENDENT_AMBULATORY_CARE_PROVIDER_SITE_OTHER): Payer: BC Managed Care – PPO | Admitting: Family Medicine

## 2018-12-21 VITALS — BP 154/70 | HR 66 | Temp 98.8°F | Resp 14 | Ht 67.0 in | Wt 171.0 lb

## 2018-12-21 DIAGNOSIS — A46 Erysipelas: Secondary | ICD-10-CM

## 2018-12-21 MED ORDER — CEPHALEXIN 500 MG PO CAPS: 500.0000 mg | ORAL_CAPSULE | Freq: Three times a day (TID) | ORAL | 0 refills | Status: DC

## 2018-12-21 NOTE — Progress Notes (Signed)
Subjective:    Patient ID: [name], female    DOB: 1955/11/10, 63 y.o.   MRN: [id number]  HPI  During the current COVID-19 pandemic, the patient has been wearing facial mask as recommended.  She is developed a rash on her lip cheeks and chin.  She has been applying cortisone cream to that.  However over the last 7 to 10 days the skin has become extremely erythematous.  It has become hot.  It is indurated.  It is tender to the touch.  Please see the photograph below.   The skin on her lower left chin, her left cheek and into the left nasolabial fold is erythematous warm and painful suggesting evolving erysipelas on top of atopic dermatitis  Past Medical History:  Diagnosis Date  . Abdominal aortic aneurysm (AAA) 3.0 cm to 5.0 cm in diameter in female [facility])    None seen on prior imaging?   Marland Kitchen Anxiety   . Cataract   . Chronic left shoulder pain   . Headache   . Hiatal hernia    small  . Hyperlipidemia   . Hypertension   . Pre-diabetes   . Prediabetes   . Schatzki's ring   . Tremor    Past Surgical History:  Procedure Laterality Date  . CARDIAC CATHETERIZATION  2005   "Ms. Frede has essentially normal coronary arteries and normal left ventricular function.  She will be treated empirically with anti-reflux measures"  . CATARACT EXTRACTION, BILATERAL    . CHOLECYSTECTOMY    . COLONOSCOPY    06/16/2004   NOM:VEHMCN rectum/Diminutive polyps of the splenic flexure and the cecum/The remainder of the colonic mucosa appeared normal pathology (hyperplastic polyps).  . COLONOSCOPY N/A 08/07/2015   Dr. Gala Romney: 6 mm descending colon tubular adenoma, multiple medium-mouthed diverticula in sigmoid colon. surveillance 2022  . ESOPHAGOGASTRODUODENOSCOPY  09/11/2001   OBS:[id number] ring otherwise normal esophagus/Normal stomach/Normal D1 and D2 status post passage of a 56 [place] Maloney dilator  . ESOPHAGOGASTRODUODENOSCOPY N/A 11/11/2017   Procedure: ESOPHAGOGASTRODUODENOSCOPY (EGD);   Surgeon: Daneil Dolin, MD;  [facility];  Service: Endoscopy;  Laterality: N/A;  8:15am  . ESOPHAGOGASTRODUODENOSCOPY (EGD) WITH ESOPHAGEAL DILATION N/A 01/25/2013   Dr. Gala Romney- schatzki's ring- 24 F dilation. small hiatal hernia  . MALONEY DILATION N/A 11/11/2017   Procedure: [name] DILATION;  Surgeon: Daneil Dolin, MD;  Location: [facility];  Service: Endoscopy;  Laterality: N/A;  . TUBAL LIGATION     Current Outpatient Medications on File Prior to Visit  Medication Sig Dispense Refill  . ALPRAZolam (XANAX) 0.5 MG tablet TAKE 1 TABLET(0.5 MG) BY MOUTH TWICE DAILY AS NEEDED FOR ANXIETY 60 tablet 0  . amLODipine (NORVASC) 10 MG tablet Take 1 tablet (10 mg total) by mouth at bedtime. 90 tablet 3  . aspirin EC 81 MG tablet Take 81 mg by mouth every morning.     Marland Kitchen atorvastatin (LIPITOR) 80 MG tablet TAKE 1 TABLET BY MOUTH DAILY 90 tablet 3  . cetirizine (ZYRTEC) 10 MG tablet Take 10 mg by mouth daily.    . fluticasone (FLONASE) 50 MCG/ACT nasal spray Place 2 sprays into both nostrils daily. 16 g 6  . hydrochlorothiazide (HYDRODIURIL) 25 MG tablet TAKE 1 TABLET(25 MG) BY MOUTH DAILY 90 tablet 2  . ipratropium (ATROVENT) 0.03 % nasal spray Place 2 sprays into both nostrils every 12 (twelve) hours. 30 mL 12  . linaclotide (LINZESS) 145 MCG CAPS capsule Take 1 capsule (145 mcg total) by  mouth daily before breakfast. 30 capsule 3  . losartan (COZAAR) 100 MG tablet Take 100 mg by mouth daily.    . metoprolol succinate (TOPROL-XL) 100 MG 24 hr tablet Take 1 tablet (100 mg total) by mouth daily. Take with or immediately following a meal. 90 tablet 3  . montelukast (SINGULAIR) 10 MG tablet TAKE 1 TABLET(10 MG) BY MOUTH AT BEDTIME 30 tablet 3  . pantoprazole (PROTONIX) 40 MG tablet Take 1 tablet (40 mg total) by mouth 2 (two) times daily. Take 30 minutes before meals 60 tablet 5  . potassium chloride SA (K-DUR) 20 MEQ tablet Take 1 tablet (20 mEq total) by mouth daily. 30 tablet 5  .  sucralfate (CARAFATE) 1 g tablet TAKE 1 TABLET(1 GRAM) BY MOUTH FOUR TIMES DAILY AT BEDTIME WITH MEALS 120 tablet 1  . triamcinolone cream (KENALOG) 0.1 % Apply 1 application topically 2 (two) times daily. 45 g 0  . vitamin B-12 (CYANOCOBALAMIN) 1000 MCG tablet Take 1,000 mcg by mouth daily.    . vitamin C (ASCORBIC ACID) 500 MG tablet Take 1,000 mg by mouth daily.    Marland Kitchen VITAMIN E PO Take by mouth daily.     No current facility-administered medications on file prior to visit.    Allergies  Allergen Reactions  . Ciprofloxacin Shortness Of Breath   Social History   Socioeconomic History  . Marital status: Single    Spouse name: Not on file  . Number of children: 3  . Years of education: Not on file  . Highest education level: Not on file  Occupational History  . 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 Financial resource strain: Not on file  . Food insecurity    Worry: Not on file    Inability: Not on file  . Transportation needs    Medical: Not on file    Non-medical: Not on file  Tobacco Use  . Smoking status: Former Smoker    Packs/day: 0.25    Years: 35.00    Pack years: 8.75    Types: Cigarettes    Quit date: 12/31/2012    Years since quitting: 5.9  . Smokeless tobacco: Never Used  Substance and Sexual Activity  . Alcohol use: Not Currently    Comment: occasionaly  . Drug use: No  . Sexual activity: Not Currently    Partners: Male    Birth control/protection: Post-menopausal, Surgical    Comment: tubal  Lifestyle  . Physical activity    Days per week: Not on file    Minutes per session: Not on file  . Stress: Not on file  Relationships  . Social Herbalist on phone: Not on file    Gets together: Not on file    Attends religious service: Not on file    Active member of club or organization: Not on file    Attends meetings of clubs or organizations: Not on file    Relationship status: Not on file  . Intimate  partner violence    Fear of current or ex partner: Not on file    Emotionally abused: Not on file    Physically abused: Not on file    Forced sexual activity: Not on file  Other Topics Concern  . Not on file  Social History Narrative   Eats all food groups.    Wears seatbelt.    Has 3 children.   Prior smoker.   Attends church.  Used to work in [employer] fund[employer].    Drives.   Enjoys going out with family.    Family History  Problem Relation Age of Onset  . Diabetes Other   . Diabetes Mother   . Heart disease Mother   . Heart disease Father   . Diabetes Sister   . Diabetes Brother   . Hypertension Brother   . Diabetes Daughter   . Hypertension Maternal Grandmother   . Stroke Maternal Grandfather   . Early death Paternal Grandfather   . Colon cancer Neg Hx   . Liver disease Neg Hx       Review of Systems  All other systems reviewed and are negative.      Objective:   Physical Exam Vitals signs reviewed.  Constitutional:      Appearance: She is well-developed.  HENT:     Head:   Cardiovascular:     Rate and Rhythm: Normal rate and regular rhythm.     Heart sounds: Normal heart sounds. No murmur. No friction rub. No gallop.   Pulmonary:     Effort: Pulmonary effort is normal. No respiratory distress.     Breath sounds: Normal breath sounds. No stridor. No wheezing or rales.  Chest:     Chest wall: No tenderness.  Neurological:     Motor: No abnormal muscle tone.           Assessment & Plan:  The encounter diagnosis was Erysipelas. Patient is developing erysipelas on her chin and left cheek.  Begin Keflex 500 mg p.o. 3 times daily for 7 days and reassess on Monday.

## 2018-12-27 ENCOUNTER — Other Ambulatory Visit: Payer: Self-pay | Admitting: Family Medicine

## 2018-12-28 ENCOUNTER — Telehealth: Payer: Self-pay | Admitting: Family Medicine

## 2018-12-28 ENCOUNTER — Other Ambulatory Visit: Payer: Self-pay | Admitting: Family Medicine

## 2018-12-28 NOTE — Telephone Encounter (Signed)
Try triamcinolone 0.1% cream twice a day only to the chest not her face

## 2018-12-28 NOTE — Telephone Encounter (Signed)
Pt called and states that she has completed the antibx and her face is better however now her chest is broke out with the same type of rash. Can we call her in something else or does she need to come back in to be seen?

## 2018-12-29 MED ORDER — PREDNISONE 20 MG PO TABS: ORAL_TABLET | ORAL | 0 refills | Status: DC

## 2018-12-29 NOTE — Telephone Encounter (Signed)
Patient aware of providers recommendations. Called and spoke to pt and she states that her face is still swelling and would like to know if we can call in some prednisone? Per Dr. Dennard Schaumann ok to send in Prednisone and if no better over the weekend we need to see pt on Monday. Pt aware and med sent to pharm.

## 2018-12-31 ENCOUNTER — Other Ambulatory Visit: Payer: Self-pay

## 2018-12-31 ENCOUNTER — Encounter (HOSPITAL_COMMUNITY): Payer: Self-pay | Admitting: Emergency Medicine

## 2018-12-31 ENCOUNTER — Emergency Department (HOSPITAL_COMMUNITY)
Admission: EM | Admit: 2018-12-31 | Discharge: 2018-12-31 | Disposition: A | Discharge status: Home / Self Care | Payer: BC Managed Care – PPO | Attending: Emergency Medicine | Admitting: Emergency Medicine

## 2018-12-31 DIAGNOSIS — R21 Rash and other nonspecific skin eruption: Secondary | ICD-10-CM | POA: Diagnosis not present

## 2018-12-31 DIAGNOSIS — Z7982 Long term (current) use of aspirin: Secondary | ICD-10-CM | POA: Diagnosis not present

## 2018-12-31 DIAGNOSIS — E119 Type 2 diabetes mellitus without complications: Secondary | ICD-10-CM | POA: Diagnosis not present

## 2018-12-31 DIAGNOSIS — I10 Essential (primary) hypertension: Secondary | ICD-10-CM | POA: Insufficient documentation

## 2018-12-31 DIAGNOSIS — L509 Urticaria, unspecified: Secondary | ICD-10-CM

## 2018-12-31 DIAGNOSIS — Z79899 Other long term (current) drug therapy: Secondary | ICD-10-CM | POA: Insufficient documentation

## 2018-12-31 DIAGNOSIS — Z87891 Personal history of nicotine dependence: Secondary | ICD-10-CM | POA: Diagnosis not present

## 2018-12-31 MED ORDER — HYDROXYZINE HCL 25 MG PO TABS: 12.5000 mg | ORAL_TABLET | Freq: Three times a day (TID) | ORAL | 0 refills | Status: DC | PRN

## 2018-12-31 MED ORDER — TRIAMCINOLONE ACETONIDE 0.1 % EX CREA: 1.0000 "application " | TOPICAL_CREAM | Freq: Two times a day (BID) | CUTANEOUS | 0 refills | Status: DC

## 2018-12-31 MED ORDER — FAMOTIDINE 20 MG PO TABS: 20.0000 mg | ORAL_TABLET | Freq: Every day | ORAL | 0 refills | Status: DC

## 2018-12-31 NOTE — ED Provider Notes (Signed)
[facility] EMERGENCY DEPARTMENT Provider Note   CSN: [id number] Arrival date & time: 12/31/18  [id number]     History   Chief Complaint No chief complaint on file.   HPI [name] is a 63 y.o. female who presents with cc of Hives. She has had recurring hives on and off for the past 5 years.  She says that it is worse in the summertime, worse when her skin gets hot.  She seen a dermatologist and was prescribed Kenalog cream.  She was also referred to immunology but was unable to go about 2 years ago.  She notices that she breaks out from the elastic lining in her underwear and there are other substances that cause this.  She denies swelling or itching in her throat, wheezing, shortness of breath, nausea, vomiting, syncope or other signs or symptoms of anaphylaxis.  Patient recently was diagnosed with erysipelas which she got on her face after she broke out in hives from her mask.  She completed a course of antibiotics.  Her PCP put her on prednisone and she is on her second day.  She complains of significant itching which is worse at night, worse after hot showers, better when she applies Kenalog or Benadryl cream.  She denies fevers, chills, skin peeling, painful lesions.     HPI  Past Medical History:  Diagnosis Date   Abdominal aortic aneurysm (AAA) 3.0 cm to 5.0 cm in diameter in female [facility])    None seen on prior imaging?    Anxiety    Cataract    Chronic left shoulder pain    Headache    Hiatal hernia    small   Hyperlipidemia    Hypertension    Pre-diabetes    Prediabetes    Schatzki's ring    Tremor     Patient Active Problem List   Diagnosis Date Noted   Nausea without vomiting 12/06/2018   Loss of weight 12/06/2018   Controlled diabetes mellitus type 2 with complications ([place]) 123XX123   Essential hypertension 04/25/2017   Hyperlipidemia LDL goal <70 04/25/2017   Carotid artery stenosis without cerebral infarction,  bilateral 04/25/2017   AAA (abdominal aortic aneurysm) without rupture ([place]) 04/25/2017   History of colonic polyps    Diverticulosis of colon without hemorrhage    RUQ pain 02/21/2013   Abdominal pain, epigastric 01/05/2013   Abdominal bruit 01/05/2013   Esophageal dysphagia 01/05/2013   Constipation 01/05/2013   GERD (gastroesophageal reflux disease) 01/05/2013    Past Surgical History:  Procedure Laterality Date   CARDIAC CATHETERIZATION  2005   "Ms. Brad has essentially normal coronary arteries and normal left ventricular function.  She will be treated empirically with anti-reflux measures"   CATARACT EXTRACTION, BILATERAL     CHOLECYSTECTOMY     COLONOSCOPY    06/16/2004   [id number] rectum/Diminutive polyps of the splenic flexure and the cecum/The remainder of the colonic mucosa appeared normal pathology (hyperplastic polyps).   COLONOSCOPY N/A 08/07/2015   Dr. Gala Romney: 6 mm descending colon tubular adenoma, multiple medium-mouthed diverticula in sigmoid colon. surveillance 2022   ESOPHAGOGASTRODUODENOSCOPY  09/11/2001   [id number] ring otherwise normal esophagus/Normal stomach/Normal D1 and D2 status post passage of a 56 [place] Maloney dilator   ESOPHAGOGASTRODUODENOSCOPY N/A 11/11/2017   Procedure: ESOPHAGOGASTRODUODENOSCOPY (EGD);  Surgeon: Daneil Dolin, MD;  [facility];  Service: Endoscopy;  Laterality: N/A;  8:15am   ESOPHAGOGASTRODUODENOSCOPY (EGD) WITH ESOPHAGEAL DILATION N/A 01/25/2013   Dr. Gala Romney- schatzki's  ring- 54 F dilation. small hiatal hernia   MALONEY DILATION N/A 11/11/2017   Procedure: [name] DILATION;  Surgeon: Daneil Dolin, MD;  [facility];  Service: Endoscopy;  Laterality: N/A;   TUBAL LIGATION       OB History    Gravida  4   Para  3   Term  3   Preterm      AB  1   Living  3     SAB  1   TAB      Ectopic      Multiple      Live Births               Home Medications     Prior to Admission medications   Medication Sig Start Date End Date Taking? Authorizing Provider  ALPRAZolam (XANAX) 0.5 MG tablet TAKE 1 TABLET(0.5 MG) BY MOUTH TWICE DAILY AS NEEDED FOR ANXIETY 11/02/18   Susy Frizzle, MD  amLODipine (NORVASC) 10 MG tablet Take 1 tablet (10 mg total) by mouth at bedtime. 01/24/18   Delsa Grana, PA-C  aspirin EC 81 MG tablet Take 81 mg by mouth every morning.     [provider]  atorvastatin (LIPITOR) 80 MG tablet TAKE 1 TABLET BY MOUTH DAILY 12/08/18   Susy Frizzle, MD  cephALEXin (KEFLEX) 500 MG capsule Take 1 capsule (500 mg total) by mouth 3 (three) times daily. 12/21/18   Susy Frizzle, MD  cetirizine (ZYRTEC) 10 MG tablet Take 10 mg by mouth daily.    [provider]  fluticasone (FLONASE) 50 MCG/ACT nasal spray Place 2 sprays into both nostrils daily. 09/18/18   Susy Frizzle, MD  hydrochlorothiazide (HYDRODIURIL) 25 MG tablet TAKE 1 TABLET(25 MG) BY MOUTH DAILY 06/19/18   Susy Frizzle, MD  ipratropium (ATROVENT) 0.03 % nasal spray Place 2 sprays into both nostrils every 12 (twelve) hours. 01/24/18   Delsa Grana, PA-C  linaclotide Rolan Lipa) 145 MCG CAPS capsule Take 1 capsule (145 mcg total) by mouth daily before breakfast. 11/15/18   Susy Frizzle, MD  losartan (COZAAR) 100 MG tablet Take 100 mg by mouth daily.    [provider]  metoprolol succinate (TOPROL-XL) 100 MG 24 hr tablet Take 1 tablet (100 mg total) by mouth daily. Take with or immediately following a meal. 03/03/18   Susy Frizzle, MD  montelukast (SINGULAIR) 10 MG tablet TAKE 1 TABLET(10 MG) BY MOUTH AT BEDTIME 07/17/18   Susy Frizzle, MD  pantoprazole (PROTONIX) 40 MG tablet Take 1 tablet (40 mg total) by mouth 2 (two) times daily. Take 30 minutes before meals 12/06/18   [name] S, PA-C  potassium chloride SA (K-DUR) 20 MEQ tablet Take 1 tablet (20 mEq total) by mouth daily. 11/14/18   Susy Frizzle, MD  predniSONE  (DELTASONE) 20 MG tablet 3 tabs po qd x 2 days, 2 tabs po qd x 2 days, 1 tab po qd x 2 days 12/29/18   Susy Frizzle, MD  sucralfate (CARAFATE) 1 g tablet TAKE 1 TABLET(1 GRAM) BY MOUTH FOUR TIMES DAILY AT BEDTIME WITH MEALS 12/27/18   Susy Frizzle, MD  triamcinolone cream (KENALOG) 0.1 % APPLY EXTERNALLY TO THE AFFECTED AREA TWICE DAILY 12/28/18   Susy Frizzle, MD  vitamin B-12 (CYANOCOBALAMIN) 1000 MCG tablet Take 1,000 mcg by mouth daily.    [provider]  vitamin C (ASCORBIC ACID) 500 MG tablet Take 1,000 mg by mouth  daily.    [provider]  VITAMIN E PO Take by mouth daily.    [provider]    Family History Family History  Problem Relation Age of Onset   Diabetes Other    Diabetes Mother    Heart disease Mother    Heart disease Father    Diabetes Sister    Diabetes Brother    Hypertension Brother    Diabetes Daughter    Hypertension Maternal Grandmother    Stroke Maternal Grandfather    Early death Paternal Grandfather    Colon cancer Neg Hx    Liver disease Neg Hx     Social History Social History   Tobacco Use   Smoking status: Former Smoker    Packs/day: 0.25    Years: 35.00    Pack years: 8.75    Types: Cigarettes    Quit date: 12/31/2012    Years since quitting: 6.0   Smokeless tobacco: Never Used  Substance Use Topics   Alcohol use: Not Currently    Comment: occasionaly   Drug use: No     Allergies   Ciprofloxacin   Review of Systems Review of Systems Ten systems reviewed and are negative for acute change, except as noted in the HPI.    Physical Exam Updated Vital Signs BP (!) 197/74    Pulse 70    Temp 98.4 F (36.9 C)    Resp 18    SpO2 99%   Physical Exam Vitals signs and nursing note reviewed.  Constitutional:      General: She is not in acute distress.    Appearance: She is well-developed. She is not diaphoretic.  HENT:     Head: Normocephalic and atraumatic.  Eyes:      General: No scleral icterus.    Conjunctiva/sclera: Conjunctivae normal.  Neck:     Musculoskeletal: Normal range of motion.  Cardiovascular:     Rate and Rhythm: Normal rate and regular rhythm.     Heart sounds: Normal heart sounds. No murmur. No friction rub. No gallop.   Pulmonary:     Effort: Pulmonary effort is normal. No respiratory distress.     Breath sounds: Normal breath sounds.  Abdominal:     General: Bowel sounds are normal. There is no distension.     Palpations: Abdomen is soft. There is no mass.     Tenderness: There is no abdominal tenderness. There is no guarding.  Skin:    General: Skin is warm and dry.     Findings: Rash present.  Neurological:     Mental Status: She is alert and oriented to person, place, and time.  Psychiatric:        Behavior: Behavior normal.      ED Treatments / Results  Labs (all labs ordered are listed, but only abnormal results are displayed) Labs Reviewed - No data to display  EKG None  Radiology No results found.  Procedures Procedures (including critical care time)  Medications Ordered in ED Medications - No data to display   Initial Impression / Assessment and Plan / ED Course  I have reviewed the triage vital signs and the nursing notes.  Pertinent labs & imaging results that were available during my care of the patient were reviewed by me and considered in my medical decision making (see chart for details).        Patient with recurrent hives.  She decided to come in tonight because because normally her eruption is just  on her chest and she had some on the back of her leg.  Patient is currently taking prednisone.  Will add Pepcid, hydroxyzine, and kenalog. Will refer to allergist. No evidence of anaphylaxis  Final Clinical Impressions(s) / ED Diagnoses   Final diagnoses:  [place]    ED Discharge Orders    None       Margarita Mail, PA-C 12/31/18 2358    Little, Wenda Overland, MD 01/02/19 1453

## 2018-12-31 NOTE — Discharge Instructions (Addendum)
CeraVe Hydrating facial cleanser and skin renewing Vitamin C serum    Contact a health care provider if: Your symptoms are not controlled with medicine. Your joints are painful or swollen. Get help right away if: You have a fever. You have pain in your abdomen. Your tongue or lips are swollen. Your eyelids are swollen. Your chest or throat feels tight. You have trouble breathing or swallowing

## 2018-12-31 NOTE — ED Triage Notes (Signed)
Pt here from home with a rash to chest , back and leg , pt is taking prednisone now , just finished antibiotics and has a steroid cream

## 2018-12-31 NOTE — ED Notes (Signed)
Patient verbalized understanding of discharge instructions. Opportunity for questions were provided. Pt. ambulatory and discharged home.  

## 2019-01-05 ENCOUNTER — Other Ambulatory Visit: Payer: Self-pay | Admitting: Family Medicine

## 2019-01-05 DIAGNOSIS — J301 Allergic rhinitis due to pollen: Secondary | ICD-10-CM | POA: Diagnosis not present

## 2019-01-05 DIAGNOSIS — T783XXD Angioneurotic edema, subsequent encounter: Secondary | ICD-10-CM | POA: Diagnosis not present

## 2019-01-05 DIAGNOSIS — I10 Essential (primary) hypertension: Secondary | ICD-10-CM

## 2019-01-05 DIAGNOSIS — J3081 Allergic rhinitis due to animal (cat) (dog) hair and dander: Secondary | ICD-10-CM | POA: Diagnosis not present

## 2019-01-05 DIAGNOSIS — J3089 Other allergic rhinitis: Secondary | ICD-10-CM | POA: Diagnosis not present

## 2019-01-17 ENCOUNTER — Telehealth: Payer: Self-pay | Admitting: Obstetrics and Gynecology

## 2019-01-17 NOTE — Telephone Encounter (Signed)
Pt informed of benign biopsy, and recommended for hysteroscopy, will schedule surgery and preop appt.

## 2019-02-01 ENCOUNTER — Emergency Department (HOSPITAL_COMMUNITY): Payer: Medicare Other

## 2019-02-01 ENCOUNTER — Other Ambulatory Visit: Payer: Self-pay

## 2019-02-01 ENCOUNTER — Encounter (HOSPITAL_COMMUNITY): Payer: Self-pay | Admitting: Emergency Medicine

## 2019-02-01 ENCOUNTER — Telehealth: Payer: Self-pay | Admitting: *Deleted

## 2019-02-01 ENCOUNTER — Emergency Department (HOSPITAL_COMMUNITY)
Admission: EM | Admit: 2019-02-01 | Discharge: 2019-02-01 | Disposition: A | Discharge status: Home / Self Care | Payer: Medicare Other | Attending: Emergency Medicine | Admitting: Emergency Medicine

## 2019-02-01 DIAGNOSIS — R51 Headache: Secondary | ICD-10-CM | POA: Insufficient documentation

## 2019-02-01 DIAGNOSIS — Z87891 Personal history of nicotine dependence: Secondary | ICD-10-CM | POA: Insufficient documentation

## 2019-02-01 DIAGNOSIS — Z7982 Long term (current) use of aspirin: Secondary | ICD-10-CM | POA: Diagnosis not present

## 2019-02-01 DIAGNOSIS — E876 Hypokalemia: Secondary | ICD-10-CM | POA: Diagnosis not present

## 2019-02-01 DIAGNOSIS — E119 Type 2 diabetes mellitus without complications: Secondary | ICD-10-CM | POA: Diagnosis not present

## 2019-02-01 DIAGNOSIS — I10 Essential (primary) hypertension: Secondary | ICD-10-CM

## 2019-02-01 DIAGNOSIS — Z79899 Other long term (current) drug therapy: Secondary | ICD-10-CM | POA: Diagnosis not present

## 2019-02-01 LAB — URINALYSIS, ROUTINE W REFLEX MICROSCOPIC
Bilirubin Urine: NEGATIVE
Glucose, UA: NEGATIVE mg/dL
Ketones, ur: NEGATIVE mg/dL
Leukocytes,Ua: NEGATIVE
Nitrite: NEGATIVE
Protein, ur: NEGATIVE mg/dL
Specific Gravity, Urine: 1.001 — ABNORMAL LOW (ref 1.005–1.030)
pH: 7 (ref 5.0–8.0)

## 2019-02-01 LAB — COMPREHENSIVE METABOLIC PANEL
ALT: 39 U/L (ref 0–44)
AST: 31 U/L (ref 15–41)
Albumin: 4.4 g/dL (ref 3.5–5.0)
Alkaline Phosphatase: 85 U/L (ref 38–126)
Anion gap: 11 (ref 5–15)
BUN: 7 mg/dL — ABNORMAL LOW (ref 8–23)
CO2: 25 mmol/L (ref 22–32)
Calcium: 9.4 mg/dL (ref 8.9–10.3)
Chloride: 97 mmol/L — ABNORMAL LOW (ref 98–111)
Creatinine, Ser: 0.64 mg/dL (ref 0.44–1.00)
GFR calc Af Amer: >60 mL/min (ref >60)
GFR calc non Af Amer: >60 mL/min (ref >60)
Glucose, Bld: 118 mg/dL — ABNORMAL HIGH (ref 70–99)
Potassium: 3.3 mmol/L — ABNORMAL LOW (ref 3.5–5.1)
Sodium: 133 mmol/L — ABNORMAL LOW (ref 135–145)
Total Bilirubin: 1 mg/dL (ref 0.3–1.2)
Total Protein: 7.8 g/dL (ref 6.5–8.1)

## 2019-02-01 LAB — CBC WITH DIFFERENTIAL/PLATELET
Abs Immature Granulocytes: 0.05 10*3/uL (ref 0.00–0.07)
Basophils Absolute: 0 10*3/uL (ref 0.0–0.1)
Basophils Relative: 0 %
Eosinophils Absolute: 0.1 10*3/uL (ref 0.0–0.5)
Eosinophils Relative: 1 %
HCT: 44.2 % (ref 36.0–46.0)
Hemoglobin: 14.8 g/dL (ref 12.0–15.0)
Immature Granulocytes: 1 %
Lymphocytes Relative: 37 %
Lymphs Abs: 3.6 10*3/uL (ref 0.7–4.0)
MCH: 30.3 pg (ref 26.0–34.0)
MCHC: 33.5 g/dL (ref 30.0–36.0)
MCV: 90.6 fL (ref 80.0–100.0)
Monocytes Absolute: 0.6 10*3/uL (ref 0.1–1.0)
Monocytes Relative: 6 %
Neutro Abs: 5.5 10*3/uL (ref 1.7–7.7)
Neutrophils Relative %: 55 %
Platelets: 291 10*3/uL (ref 150–400)
RBC: 4.88 MIL/uL (ref 3.87–5.11)
RDW: 12.3 % (ref 11.5–15.5)
WBC: 9.7 10*3/uL (ref 4.0–10.5)
nRBC: 0 % (ref 0.0–0.2)

## 2019-02-01 LAB — TROPONIN I (HIGH SENSITIVITY)
Troponin I (High Sensitivity): 19 ng/L — ABNORMAL HIGH (ref <18)
Troponin I (High Sensitivity): 18 ng/L — ABNORMAL HIGH (ref <18)

## 2019-02-01 MED ORDER — CLONIDINE HCL 0.1 MG PO TABS
0.1000 mg | ORAL_TABLET | Freq: Once | ORAL | Status: AC
  Administered 2019-02-01: 0.1 mg via ORAL
  Filled 2019-02-01: qty 1

## 2019-02-01 MED ORDER — POTASSIUM CHLORIDE CRYS ER 20 MEQ PO TBCR
40.0000 meq | EXTENDED_RELEASE_TABLET | Freq: Once | ORAL | Status: AC
  Administered 2019-02-01: 23:00:00 40 meq via ORAL
  Filled 2019-02-01: qty 2

## 2019-02-01 NOTE — ED Triage Notes (Signed)
BP has been high on and off x 2 weeks.  200's sys today.  Reports headache, nausea and back pain.

## 2019-02-01 NOTE — Telephone Encounter (Signed)
Received call from patient and patient daughter.   Reports that she has had elevated BP today.   Morning BP noted at 225/ 76, HR 80. Clonidine 0.1mg  taken and BP decreased to 214/ 87, HR 80.  States that she has been feeling lightheaded, and has HA in the back of head. Reports that she has some tightness to her chest, and feels like her heartbeat is "fluttering".   Also reports decreased urine output. Reports that she is drinking normal amount of fluids, but states that she did not have any output from 5am- 1pm. Reports that she has had <149mL output since then.   Advised to take patient to ER for eval of HTN, chest tightness and decreased urine output.   MD to be made aware.

## 2019-02-01 NOTE — ED Notes (Signed)
Pt's Oxygen maintained high 90's while ambulating in hall around nurse station.

## 2019-02-01 NOTE — Telephone Encounter (Signed)
I agree, sounds like hypertensive urgency

## 2019-02-01 NOTE — Discharge Instructions (Addendum)
Take your usual prescriptions as previously directed.  Take your blood pressure only once per day, either in the morning approximately 1 hour after you take your medicine(s) or in the evening before you go to bed, a few times per week.  Always sit quietly for at least 15 minutes before taking your blood pressure.  Keep a diary of your blood pressures to show your doctor at your follow up office visit.  Call your regular medical doctor tomorrow morning to schedule a follow up appointment in the next 1 to 2 days. Call the Cardiologist tomorrow to schedule a follow up appointment within the next week. Return to the Emergency Department immediately sooner if worsening.

## 2019-02-01 NOTE — ED Provider Notes (Signed)
[name] EMERGENCY DEPARTMENT Provider Note   CSN: [id number] Arrival date & time: 02/01/19  1526     History   Chief Complaint Chief Complaint  Patient presents with   Hypertension    HPI [name] is a 63 y.o. female.     HPI Pt was seen at [place]. Per pt, c/o gradual onset and persistence of multiple episodes of "high blood pressure" for the past 2 weeks. Pt states her SBP is usually "130's" but has been "190's." Pt states she also has had "fluttering" in her chest for the past 4 days, as well as a dull headache. States she took one dose of clonidine today without change in her BP. Endorses compliance with her other BP meds. Denies any changes in her meds. Denies SOB/cough, no abd pain, no N/V/D, no fevers, no rash, no visual changes, no focal motor weakness, no tingling/numbness in extremities, no ataxia, no slurred speech, no facial droop.     Past Medical History:  Diagnosis Date   Abdominal aortic aneurysm (AAA) 3.0 cm to 5.0 cm in diameter in female [facility])    None seen on prior imaging?    Anxiety    Cataract    Chronic left shoulder pain    Headache    Hiatal hernia    small   Hyperlipidemia    Hypertension    Pre-diabetes    Prediabetes    Schatzki's ring    Tremor     Patient Active Problem List   Diagnosis Date Noted   Nausea without vomiting 12/06/2018   Loss of weight 12/06/2018   Controlled diabetes mellitus type 2 with complications ([place]) [id number]   Essential hypertension 04/25/2017   Hyperlipidemia LDL goal <70 04/25/2017   Carotid artery stenosis without cerebral infarction, bilateral 04/25/2017   AAA (abdominal aortic aneurysm) without rupture ([place]) 04/25/2017   History of colonic polyps    Diverticulosis of colon without hemorrhage    RUQ pain 02/21/2013   Abdominal pain, epigastric 01/05/2013   Abdominal bruit 01/05/2013   Esophageal dysphagia 01/05/2013   Constipation 01/05/2013   GERD  (gastroesophageal reflux disease) 01/05/2013    Past Surgical History:  Procedure Laterality Date   CARDIAC CATHETERIZATION  2005   "Ms. Cesaro has essentially normal coronary arteries and normal left ventricular function.  She will be treated empirically with anti-reflux measures"   CATARACT EXTRACTION, BILATERAL     CHOLECYSTECTOMY     COLONOSCOPY    06/16/2004   [id number] rectum/Diminutive polyps of the splenic flexure and the cecum/The remainder of the colonic mucosa appeared normal pathology (hyperplastic polyps).   COLONOSCOPY N/A 08/07/2015   Dr. Gala Romney: 6 mm descending colon tubular adenoma, multiple medium-mouthed diverticula in sigmoid colon. surveillance 2022   ESOPHAGOGASTRODUODENOSCOPY  09/11/2001   [id number] ring otherwise normal esophagus/Normal stomach/Normal D1 and D2 status post passage of a 56 French Maloney dilator   ESOPHAGOGASTRODUODENOSCOPY N/A 11/11/2017   Procedure: ESOPHAGOGASTRODUODENOSCOPY (EGD);  Surgeon: Daneil Dolin, MD;  Location: [facility] ENDO SUITE;  Service: Endoscopy;  Laterality: N/A;  8:15am   ESOPHAGOGASTRODUODENOSCOPY (EGD) WITH ESOPHAGEAL DILATION N/A 01/25/2013   Dr. Gala Romney- schatzki's ring- 23 F dilation. small hiatal hernia   MALONEY DILATION N/A 11/11/2017   Procedure: [name] DILATION;  Surgeon: Daneil Dolin, MD;  Location: [facility] ENDO SUITE;  Service: Endoscopy;  Laterality: N/A;   TUBAL LIGATION       OB History    Gravida  4   Para  3   Term  3  Preterm      AB  1   Living  3     SAB  1   TAB      Ectopic      Multiple      Live Births               Home Medications    Prior to Admission medications   Medication Sig Start Date End Date Taking? Authorizing Provider  ALPRAZolam (XANAX) 0.5 MG tablet TAKE 1 TABLET(0.5 MG) BY MOUTH TWICE DAILY AS NEEDED FOR ANXIETY 11/02/18   Susy Frizzle, MD  amLODipine (NORVASC) 10 MG tablet TAKE 1 TABLET(10 MG) BY MOUTH AT BEDTIME 01/05/19   Susy Frizzle, MD    aspirin EC 81 MG tablet Take 81 mg by mouth every morning.     [provider]  atorvastatin (LIPITOR) 80 MG tablet TAKE 1 TABLET BY MOUTH DAILY 12/08/18   Susy Frizzle, MD  cephALEXin (KEFLEX) 500 MG capsule Take 1 capsule (500 mg total) by mouth 3 (three) times daily. 12/21/18   Susy Frizzle, MD  cetirizine (ZYRTEC) 10 MG tablet Take 10 mg by mouth daily.    [provider]  famotidine (PEPCID) 20 MG tablet Take 1 tablet (20 mg total) by mouth daily. 12/31/18   Harris, Abigail, PA-C  fluticasone (FLONASE) 50 MCG/ACT nasal spray Place 2 sprays into both nostrils daily. 09/18/18   Susy Frizzle, MD  hydrochlorothiazide (HYDRODIURIL) 25 MG tablet TAKE 1 TABLET(25 MG) BY MOUTH DAILY 06/19/18   Susy Frizzle, MD  hydrOXYzine (ATARAX/VISTARIL) 25 MG tablet Take 0.5-1 tablets (12.5-25 mg total) by mouth every 8 (eight) hours as needed for itching. 12/31/18   Margarita Mail, PA-C  ipratropium (ATROVENT) 0.03 % nasal spray Place 2 sprays into both nostrils every 12 (twelve) hours. 01/24/18   Delsa Grana, PA-C  linaclotide Rolan Lipa) 145 MCG CAPS capsule Take 1 capsule (145 mcg total) by mouth daily before breakfast. 11/15/18   Susy Frizzle, MD  losartan (COZAAR) 100 MG tablet Take 100 mg by mouth daily.    [provider]  metoprolol succinate (TOPROL-XL) 100 MG 24 hr tablet Take 1 tablet (100 mg total) by mouth daily. Take with or immediately following a meal. 03/03/18   Susy Frizzle, MD  montelukast (SINGULAIR) 10 MG tablet TAKE 1 TABLET(10 MG) BY MOUTH AT BEDTIME 07/17/18   Susy Frizzle, MD  pantoprazole (PROTONIX) 40 MG tablet Take 1 tablet (40 mg total) by mouth 2 (two) times daily. Take 30 minutes before meals 12/06/18   [name] S, PA-C  potassium chloride SA (K-DUR) 20 MEQ tablet Take 1 tablet (20 mEq total) by mouth daily. 11/14/18   Susy Frizzle, MD  predniSONE (DELTASONE) 20 MG tablet 3 tabs po qd x 2 days, 2 tabs po qd x 2 days, 1 tab  po qd x 2 days 12/29/18   Susy Frizzle, MD  sucralfate (CARAFATE) 1 g tablet TAKE 1 TABLET(1 GRAM) BY MOUTH FOUR TIMES DAILY AT BEDTIME WITH MEALS 12/27/18   Susy Frizzle, MD  triamcinolone cream (KENALOG) 0.1 % APPLY EXTERNALLY TO THE AFFECTED AREA TWICE DAILY 12/28/18   Susy Frizzle, MD  triamcinolone cream (KENALOG) 0.1 % Apply 1 application topically 2 (two) times daily. 12/31/18   Margarita Mail, PA-C  vitamin B-12 (CYANOCOBALAMIN) 1000 MCG tablet Take 1,000 mcg by mouth daily.    [provider]  vitamin C (ASCORBIC ACID) 500 MG tablet Take  1,000 mg by mouth daily.    [provider]  VITAMIN E PO Take by mouth daily.    [provider]    Family History Family History  Problem Relation Age of Onset   Diabetes Other    Diabetes Mother    Heart disease Mother    Heart disease Father    Diabetes Sister    Diabetes Brother    Hypertension Brother    Diabetes Daughter    Hypertension Maternal Grandmother    Stroke Maternal Grandfather    Early death Paternal Grandfather    Colon cancer Neg Hx    Liver disease Neg Hx     Social History Social History   Tobacco Use   Smoking status: Former Smoker    Packs/day: 0.25    Years: 35.00    Pack years: 8.75    Types: Cigarettes    Quit date: 12/31/2012    Years since quitting: 6.0   Smokeless tobacco: Never Used  Substance Use Topics   Alcohol use: Not Currently    Comment: occasionaly   Drug use: No     Allergies   Ciprofloxacin   Review of Systems Review of Systems ROS: Statement: All systems negative except as marked or noted in the HPI; Constitutional: Negative for fever and chills. ; ; Eyes: Negative for eye pain, redness and discharge. ; ; ENMT: Negative for ear pain, hoarseness, nasal congestion, sinus pressure and sore throat. ; ; Cardiovascular: +CP, palpitations. Negative for diaphoresis, dyspnea and peripheral edema. ; ; Respiratory: Negative for cough,  wheezing and stridor. ; ; Gastrointestinal: Negative for nausea, vomiting, diarrhea, abdominal pain, blood in stool, hematemesis, jaundice and rectal bleeding. . ; ; Genitourinary: Negative for dysuria, flank pain and hematuria. ; ; Musculoskeletal: Negative for back pain and neck pain. Negative for swelling and trauma.; ; Skin: Negative for pruritus, rash, abrasions, blisters, bruising and skin lesion.; ; Neuro: +headache. Negative for lightheadedness and neck stiffness. Negative for weakness, altered level of consciousness, altered mental status, extremity weakness, paresthesias, involuntary movement, seizure and syncope.       Physical Exam Updated Vital Signs BP (!) 218/104 (BP Location: Right Arm)    Pulse 86    Temp 98.1 F (36.7 C) (Oral)    Resp 16    Ht 5\' 7"  (1.702 m)    Wt 77.6 kg    SpO2 99%    BMI 26.78 kg/m    Patient Vitals for the past 24 hrs:  BP Temp Temp src Pulse Resp SpO2 Height Weight  02/01/19 2100 (!) 173/71 -- -- -- 12 -- -- --  02/01/19 2048 (!) 166/83 -- -- -- -- -- -- --  02/01/19 2030 (!) 166/83 -- -- -- 14 -- -- --  02/01/19 2015 -- -- -- 82 14 98 % -- --  02/01/19 1628 -- -- -- -- -- -- 5\' 7"  (1.702 m) 77.6 kg  02/01/19 1627 (!) 218/104 98.1 F (36.7 C) Oral 86 16 99 % -- --     Physical Exam 1945: Physical examination:  Nursing notes reviewed; Vital signs and O2 SAT reviewed;  Constitutional: Well developed, Well nourished, Well hydrated, In no acute distress; Head:  Normocephalic, atraumatic; Eyes: EOMI, PERRL, No scleral icterus; ENMT: Mouth and pharynx normal, Mucous membranes moist; Neck: Supple, Full range of motion, No lymphadenopathy; Cardiovascular: Regular rate and rhythm, No gallop; Respiratory: Breath sounds clear & equal bilaterally, No wheezes.  Speaking full sentences with ease, Normal respiratory effort/excursion; Chest: Nontender,  Movement normal; Abdomen: Soft, Nontender, Nondistended, Normal bowel sounds; Genitourinary: No CVA tenderness;  Extremities: Peripheral pulses normal, No tenderness, No edema, No calf edema or asymmetry.; Neuro: AA&Ox3, Major CN grossly intact. No facial droop. Speech clear. No gross focal motor or sensory deficits in extremities. Climbs on and off stretcher easily by herself. Gait steady..; Skin: Color normal, Warm, Dry.; Psych:  Anxious.    ED Treatments / Results  Labs (all labs ordered are listed, but only abnormal results are displayed)   EKG EKG Interpretation  Date/Time:  Thursday February 01 2019 20:07:30 EDT Ventricular Rate:  85 PR Interval:    QRS Duration: 92 QT Interval:  406 QTC Calculation: 483 R Axis:   59 Text Interpretation:  Sinus rhythm Probable left atrial enlargement Baseline wander When compared with ECG of 12/28/2017 No significant change was found Confirmed by Francine Graven [phone]) on 02/01/2019 8:12:17 PM   Radiology   Procedures Procedures (including critical care time)  Medications Ordered in ED Medications  cloNIDine (CATAPRES) tablet 0.1 mg (has no administration in time range)     Initial Impression / Assessment and Plan / ED Course  I have reviewed the triage vital signs and the nursing notes.  Pertinent labs & imaging results that were available during my care of the patient were reviewed by me and considered in my medical decision making (see chart for details).     MDM Reviewed: previous chart, nursing note and vitals Reviewed previous: labs and ECG Interpretation: labs, ECG, x-ray and CT scan   Results for orders placed or performed during the hospital encounter of 02/01/19  Comprehensive metabolic panel  Result Value Ref Range   Sodium 133 (L) 135 - 145 mmol/L   Potassium 3.3 (L) 3.5 - 5.1 mmol/L   Chloride 97 (L) 98 - 111 mmol/L   CO2 25 22 - 32 mmol/L   Glucose, Bld 118 (H) 70 - 99 mg/dL   BUN 7 (L) 8 - 23 mg/dL   Creatinine, Ser 0.64 0.44 - 1.00 mg/dL   Calcium 9.4 8.9 - 10.3 mg/dL   Total Protein 7.8 6.5 - 8.1 g/dL   Albumin  4.4 3.5 - 5.0 g/dL   AST 31 15 - 41 U/L   ALT 39 0 - 44 U/L   Alkaline Phosphatase 85 38 - 126 U/L   Total Bilirubin 1.0 0.3 - 1.2 mg/dL   GFR calc non Af Amer >60 >60 mL/min   GFR calc Af Amer >60 >60 mL/min   Anion gap 11 5 - 15  CBC with Differential  Result Value Ref Range   WBC 9.7 4.0 - 10.5 K/uL   RBC 4.88 3.87 - 5.11 MIL/uL   Hemoglobin 14.8 12.0 - 15.0 g/dL   HCT 44.2 36.0 - 46.0 %   MCV 90.6 80.0 - 100.0 fL   MCH 30.3 26.0 - 34.0 pg   MCHC 33.5 30.0 - 36.0 g/dL   RDW 12.3 11.5 - 15.5 %   Platelets 291 150 - 400 K/uL   nRBC 0.0 0.0 - 0.2 %   Neutrophils Relative % 55 %   Neutro Abs 5.5 1.7 - 7.7 K/uL   Lymphocytes Relative 37 %   Lymphs Abs 3.6 0.7 - 4.0 K/uL   Monocytes Relative 6 %   Monocytes Absolute 0.6 0.1 - 1.0 K/uL   Eosinophils Relative 1 %   Eosinophils Absolute 0.1 0.0 - 0.5 K/uL   Basophils Relative 0 %   Basophils Absolute 0.0 0.0 - 0.1 K/uL  Immature Granulocytes 1 %   Abs Immature Granulocytes 0.05 0.00 - 0.07 K/uL  Urinalysis, Routine w reflex microscopic  Result Value Ref Range   Color, Urine COLORLESS (A) YELLOW   APPearance CLEAR CLEAR   Specific Gravity, Urine 1.001 (L) 1.005 - 1.030   pH 7.0 5.0 - 8.0   Glucose, UA NEGATIVE NEGATIVE mg/dL   Hgb urine dipstick SMALL (A) NEGATIVE   Bilirubin Urine NEGATIVE NEGATIVE   Ketones, ur NEGATIVE NEGATIVE mg/dL   Protein, ur NEGATIVE NEGATIVE mg/dL   Nitrite NEGATIVE NEGATIVE   Leukocytes,Ua NEGATIVE NEGATIVE   RBC / HPF 0-5 0 - 5 RBC/hpf   WBC, UA 0-5 0 - 5 WBC/hpf   Bacteria, UA RARE (A) NONE SEEN   Squamous Epithelial / LPF 0-5 0 - 5  Troponin I (High Sensitivity)  Result Value Ref Range   Troponin I (High Sensitivity) 19 (H) <18 ng/L  Troponin I (High Sensitivity)  Result Value Ref Range   Troponin I (High Sensitivity) 18 (H) <18 ng/L   Dg Chest 2 View Result Date: 02/01/2019 CLINICAL DATA:  Hypertension EXAM: CHEST - 2 VIEW COMPARISON:  12/28/2017 FINDINGS: Heart is normal size. Aortic  atherosclerosis. Lungs clear. No effusions. No acute bony abnormality. IMPRESSION: No active cardiopulmonary disease. Electronically Signed   By: Rolm Baptise M.D.   On: 02/01/2019 20:51   Ct Head Wo Contrast Result Date: 02/01/2019 CLINICAL DATA:  Headache. Intermittent hypertension with systolic pressures in the 200s. EXAM: CT HEAD WITHOUT CONTRAST TECHNIQUE: Contiguous axial images were obtained from the base of the skull through the vertex without intravenous contrast. COMPARISON:  CT head dated December 29, 2017. FINDINGS: Brain: No evidence of acute infarction, hemorrhage, hydrocephalus, extra-axial collection or mass lesion/mass effect. Unchanged empty sella. Vascular: Atherosclerotic vascular calcification of the carotid siphons. No hyperdense vessel. Skull: Normal. Negative for fracture or focal lesion. Sinuses/Orbits: No acute finding. Other: None. IMPRESSION: 1.  No acute intracranial abnormality. Electronically Signed   By: Titus Dubin M.D.   On: 02/01/2019 20:38      [name] was evaluated in Emergency Department on 02/01/2019 for the symptoms described in the history of present illness. She was evaluated in the context of the global COVID-19 pandemic, which necessitated consideration that the patient might be at risk for infection with the SARS-CoV-2 virus that causes COVID-19. Institutional protocols and algorithms that pertain to the evaluation of patients at risk for COVID-19 are in a state of rapid change based on information released by regulatory bodies including the CDC and federal and state organizations. These policies and algorithms were followed during the patient's care in the ED.   2015:  Pt ambulated: HR max 95, Sats high 90's R/A. Pt in NAD, resps easy, gait steady, no complaints. Will dose pt her own clonidine PO.   2310:  Potassium repleted PO. BP improved after pt took her meds. Pt also appears less anxious after her family member has arrived. Pt's daughter  feels admission with echocardiogram to be performed would be appropriate. T/C returned from [place] Fellow Dr. Hassell Done, case discussed, including:  HPI, pertinent PM/SHx, VS/PE, dx testing, ED course and treatment:  Agrees troponins are flat, EKG unchanged, does not necessarily need admit +/- echo (which could be obtained as outpatient), if family/pt want admission pt can stay at [facility] to cycle enzymes.  2320:  Dx and testing, as well as d/w Cards MD, d/w pt and family.  Questions answered.  Verb understanding. Pt now states she  does not want to be admitted and wants to go home.   I encouraged pt to stay, continues to refuse.  Pt makes her own medical decisions.  Risks of AMA explained to pt and family, including, but not limited to:  stroke, heart attack, cardiac arrythmia ("irregular heart rate/beat"), "passing out," temporary and/or permanent disability, death.  Pt verb understanding and continue to refuse admission, understanding the consequences of her decision.  I encouraged pt to follow up with her PMD tomorrow and call Cardiology for appointment, and return to the ED immediately if symptoms return, or for any other concerns.  Pt and family verb understanding, agreeable.       Final Clinical Impressions(s) / ED Diagnoses   Final diagnoses:  None    ED Discharge Orders    None       Francine Graven, DO 02/06/19 1108

## 2019-02-01 NOTE — ED Notes (Signed)
While ambulating Pt around Nurse station, Pt's Pulse maintained < 100. Highest Pulse noted was 95 upon ambulation.

## 2019-02-01 NOTE — ED Notes (Signed)
Patient transported to CT 

## 2019-02-02 ENCOUNTER — Other Ambulatory Visit: Payer: Self-pay

## 2019-02-05 ENCOUNTER — Other Ambulatory Visit: Payer: Self-pay | Admitting: Family Medicine

## 2019-02-05 ENCOUNTER — Ambulatory Visit (INDEPENDENT_AMBULATORY_CARE_PROVIDER_SITE_OTHER): Payer: Medicare Other | Admitting: Family Medicine

## 2019-02-05 ENCOUNTER — Other Ambulatory Visit: Payer: Self-pay

## 2019-02-05 ENCOUNTER — Encounter: Payer: Self-pay | Admitting: Family Medicine

## 2019-02-05 VITALS — BP 162/74 | HR 68 | Temp 98.7°F | Resp 16 | Ht 67.0 in | Wt 173.0 lb

## 2019-02-05 DIAGNOSIS — I10 Essential (primary) hypertension: Secondary | ICD-10-CM | POA: Diagnosis not present

## 2019-02-05 DIAGNOSIS — J329 Chronic sinusitis, unspecified: Secondary | ICD-10-CM

## 2019-02-05 MED ORDER — CLONIDINE HCL 0.3 MG/24HR TD PTWK: 0.3000 mg | MEDICATED_PATCH | TRANSDERMAL | 12 refills | Status: DC

## 2019-02-05 MED ORDER — VALSARTAN 320 MG PO TABS: 320.0000 mg | ORAL_TABLET | Freq: Every day | ORAL | 3 refills | Status: DC

## 2019-02-05 NOTE — Progress Notes (Signed)
Subjective:    Patient ID: [name], female    DOB: 1955/08/07, 63 y.o.   MRN: [id number]  HPI  Was seen last week in the emergency room.  Blood pressure was greater than [id number] systolic.  Patient states that she was having a dull headache, some vision changes, and oliguria.  She went to the emergency room.  2 sets of troponins were borderline at 18 and 19.  The remainder of her lab work was normal.  Patient was discharged home however her blood pressure has remained elevated since.  She states that her systolic blood pressures between 160 and 180.  She is consistently taking her amlodipine, losartan, metoprolol, and hydrochlorothiazide.  She has taken 2 doses of clonidine when her blood pressure becomes really high however the medication makes her sleepy.  She denies any chest pain or shortness of breath or dyspnea on exertion today  Past Medical History:  Diagnosis Date  . Abdominal aortic aneurysm (AAA) 3.0 cm to 5.0 cm in diameter in female [facility])    None seen on prior imaging?   Marland Kitchen Anxiety   . Cataract   . Chronic left shoulder pain   . Headache   . Hiatal hernia    small  . Hyperlipidemia   . Hypertension   . Pre-diabetes   . Prediabetes   . Schatzki's ring   . Tremor    Past Surgical History:  Procedure Laterality Date  . CARDIAC CATHETERIZATION  2005   "[name] has essentially normal coronary arteries and normal left ventricular function.  She will be treated empirically with anti-reflux measures"  . CATARACT EXTRACTION, BILATERAL    . CHOLECYSTECTOMY    . COLONOSCOPY    06/16/2004   [id number] rectum/Diminutive polyps of the splenic flexure and the cecum/The remainder of the colonic mucosa appeared normal pathology (hyperplastic polyps).  . COLONOSCOPY N/A 08/07/2015   Dr. Gala Romney: 6 mm descending colon tubular adenoma, multiple medium-mouthed diverticula in sigmoid colon. surveillance 2022  . ESOPHAGOGASTRODUODENOSCOPY  09/11/2001   [id number] ring otherwise  normal esophagus/Normal stomach/Normal D1 and D2 status post passage of a 56 [place] Maloney dilator  . ESOPHAGOGASTRODUODENOSCOPY N/A 11/11/2017   Procedure: ESOPHAGOGASTRODUODENOSCOPY (EGD);  Surgeon: Daneil Dolin, MD;  [facility];  Service: Endoscopy;  Laterality: N/A;  8:15am  . ESOPHAGOGASTRODUODENOSCOPY (EGD) WITH ESOPHAGEAL DILATION N/A 01/25/2013   Dr. Gala Romney- schatzki's ring- 25 F dilation. small hiatal hernia  . MALONEY DILATION N/A 11/11/2017   Procedure: [name] DILATION;  Surgeon: Daneil Dolin, MD;  Location: [facility];  Service: Endoscopy;  Laterality: N/A;  . TUBAL LIGATION     Current Outpatient Medications on File Prior to Visit  Medication Sig Dispense Refill  . ALPRAZolam (XANAX) 0.5 MG tablet TAKE 1 TABLET(0.5 MG) BY MOUTH TWICE DAILY AS NEEDED FOR ANXIETY 60 tablet 0  . amLODipine (NORVASC) 10 MG tablet TAKE 1 TABLET(10 MG) BY MOUTH AT BEDTIME 90 tablet 3  . aspirin EC 81 MG tablet Take 81 mg by mouth at bedtime.     Marland Kitchen atorvastatin (LIPITOR) 80 MG tablet TAKE 1 TABLET BY MOUTH DAILY 90 tablet 3  . cetirizine (ZYRTEC) 10 MG tablet Take 10 mg by mouth daily.    . cloNIDine (CATAPRES) 0.1 MG tablet Take 0.1 mg by mouth 3 (three) times daily as needed (for high blood pressure).    Marland Kitchen EPINEPHrine 0.3 mg/0.3 mL IJ SOAJ injection Inject 0.3 mg into the muscle as needed for anaphylaxis.     Marland Kitchen  fluticasone (FLONASE) 50 MCG/ACT nasal spray Place 2 sprays into both nostrils daily. 16 g 6  . hydrochlorothiazide (HYDRODIURIL) 25 MG tablet TAKE 1 TABLET(25 MG) BY MOUTH DAILY 90 tablet 2  . hydrOXYzine (ATARAX/VISTARIL) 25 MG tablet Take 0.5-1 tablets (12.5-25 mg total) by mouth every 8 (eight) hours as needed for itching. 20 tablet 0  . linaclotide (LINZESS) 145 MCG CAPS capsule Take 1 capsule (145 mcg total) by mouth daily before breakfast. (Patient taking differently: Take 145 mcg by mouth as needed. ) 30 capsule 3  . losartan (COZAAR) 100 MG tablet Take 100 mg by mouth  daily.    . metoprolol succinate (TOPROL-XL) 100 MG 24 hr tablet Take 1 tablet (100 mg total) by mouth daily. Take with or immediately following a meal. 90 tablet 3  . mometasone (ELOCON) 0.1 % cream Apply 1 application topically daily as needed (for irritation).     . montelukast (SINGULAIR) 10 MG tablet TAKE 1 TABLET(10 MG) BY MOUTH AT BEDTIME 30 tablet 3  . pantoprazole (PROTONIX) 40 MG tablet Take 1 tablet (40 mg total) by mouth 2 (two) times daily. Take 30 minutes before meals 60 tablet 5  . potassium chloride SA (K-DUR) 20 MEQ tablet Take 1 tablet (20 mEq total) by mouth daily. 30 tablet 5  . tacrolimus (PROTOPIC) 0.1 % ointment Apply 1 application topically daily as needed (for irritation).     . triamcinolone cream (KENALOG) 0.1 % APPLY EXTERNALLY TO THE AFFECTED AREA TWICE DAILY 45 g 0  . vitamin B-12 (CYANOCOBALAMIN) 1000 MCG tablet Take 1,000 mcg by mouth daily.    . vitamin C (ASCORBIC ACID) 500 MG tablet Take 1,000 mg by mouth daily.    Marland Kitchen VITAMIN E PO Take 1 tablet by mouth daily.      No current facility-administered medications on file prior to visit.    Allergies  Allergen Reactions  . Ciprofloxacin Shortness Of Breath   Social History   Socioeconomic History  . Marital status: Single    Spouse name: Not on file  . Number of children: 3  . Years of education: Not on file  . Highest education level: Not on file  Occupational History  . 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 Financial resource strain: Not on file  . Food insecurity    Worry: Not on file    Inability: Not on file  . Transportation needs    Medical: Not on file    Non-medical: Not on file  Tobacco Use  . Smoking status: Former Smoker    Packs/day: 0.25    Years: 35.00    Pack years: 8.75    Types: Cigarettes    Quit date: 12/31/2012    Years since quitting: 6.1  . Smokeless tobacco: Never Used  Substance and Sexual Activity  . Alcohol use: Not Currently     Comment: occasionaly  . Drug use: No  . Sexual activity: Not Currently    Partners: Male    Birth control/protection: Post-menopausal, Surgical    Comment: tubal  Lifestyle  . Physical activity    Days per week: Not on file    Minutes per session: Not on file  . Stress: Not on file  Relationships  . Social Herbalist on phone: Not on file    Gets together: Not on file    Attends religious service: Not on file    Active member of club or organization: Not on  file    Attends meetings of clubs or organizations: Not on file    Relationship status: Not on file  . Intimate partner violence    Fear of current or ex partner: Not on file    Emotionally abused: Not on file    Physically abused: Not on file    Forced sexual activity: Not on file  Other Topics Concern  . Not on file  Social History Narrative   Eats all food groups.    Wears seatbelt.    Has 3 children.   Prior smoker.   Attends church.    Used to work in [employer] fund[employer].    Drives.   Enjoys going out with family.    Family History  Problem Relation Age of Onset  . Diabetes Other   . Diabetes Mother   . Heart disease Mother   . Heart disease Father   . Diabetes Sister   . Diabetes Brother   . Hypertension Brother   . Diabetes Daughter   . Hypertension Maternal Grandmother   . Stroke Maternal Grandfather   . Early death Paternal Grandfather   . Colon cancer Neg Hx   . Liver disease Neg Hx       Review of Systems  All other systems reviewed and are negative.      Objective:   Physical Exam Vitals signs reviewed.  Constitutional:      Appearance: She is well-developed.  Cardiovascular:     Rate and Rhythm: Normal rate and regular rhythm.     Heart sounds: Normal heart sounds. No murmur. No friction rub. No gallop.   Pulmonary:     Effort: Pulmonary effort is normal. No respiratory distress.     Breath sounds: Normal breath sounds. No stridor. No wheezing or rales.  Chest:     Chest wall: No  tenderness.  Neurological:     Motor: No abnormal muscle tone.           Assessment & Plan:  Benign essential HTN  Blood pressure is elevated.  We will start clonidine 0.3 mg per 24-hour transdermal patch.  Patient also states that her blood pressure has been more difficult to control since she switched from valsartan to losartan.  Therefore we will discontinue losartan and switch the patient back to valsartan 320 mg daily.  I will recheck the patient's blood pressure in 1 week or sooner if symptoms develop.

## 2019-02-11 NOTE — Progress Notes (Signed)
Referring Provider: Susy Frizzle, MD Primary Care Physician:  Susy Frizzle, MD Primary GI Physician: Dr. Gala Romney  Chief Complaint  Patient presents with  . Abdominal Pain    "butterfly feeling in top of stomach"    HPI:   [name] is a 63 y.o. female with history of constipation and GERD.  EGD on 11/11/2017 for dysphasia with mild Schatzki ring s/p dilation and mild erosive gastropathy.  Colonoscopy up-to-date in 2017.  Due for repeat in 2022.  She presents today for follow-up of epigastric abdominal pain.  Llast seen in our office on 12/06/18 for epigastric burning that had been present for about 3 months with associated daily GERD symptoms and nausea. She had been on Nexium 20 mg twice daily and was started on Carafate 1 g before meals by PCP in April 2020.  By the time she was seen in our office, her symptoms had improved significantly.  Epigastric burning had resolved, GERD symptoms occurred about once a week, and short-lived nausea about 3 times a week not associated with meals.  Admitted to occasional dysphagia with breads or coarse meats but felt this was not nearly as bad as it was prior to her esophageal dilation in 2019.  At that time patient was advised to stop Nexium and start Protonix 40 mg twice daily.  Follow-up in 2 months with a low threshold for repeat EGD if she continued to have symptoms or dysphagia worsened.  Also plan to continue following her weight along as she had lost about 8 pounds over the last year but had gained 2 lbs between June and July.  She reported she did not have a great appetite and long history of occasional early satiety but also stated she had changed her diet completely in light of fatty liver and diabetes.   Today she states she has no epigastric burning. States she has a "funny feeling" in the top of her stomach. Like a little spasm running across the upper abdomen. Just a quick butterfly sensation for a second or 2 and gone. Occurs  about 2-3 times a week.  No triggers.  Reflux maybe once a week. Taking Protonix 40 mg BID 30 minutes before breakfast and dinner. Meats are baked or broiled. Eats fish or chicken. Nausea has resolved. No vomiting. Every now and then will feel like meats will hang at the sternal notch for a couple seconds, but then goes down. Does not feel it occurs frequently enough to need dilation. Is not interested in having a EGD at this time. Has gained 2 lbs since last visit. Feels appetite is improved. No early satiety.   BMs daily with Linzess 145. No lower abdominal pain. No blood in the stool or black stools.   No NSAIDs.   Past Medical History:  Diagnosis Date  . Abdominal aortic aneurysm (AAA) 3.0 cm to 5.0 cm in diameter in female [facility])    None seen on prior imaging?   Marland Kitchen Anxiety   . Cataract   . Chronic left shoulder pain   . Headache   . Hiatal hernia    small  . Hyperlipidemia   . Hypertension   . Pre-diabetes   . Prediabetes   . Schatzki's ring   . Tremor     Past Surgical History:  Procedure Laterality Date  . CARDIAC CATHETERIZATION  2005   "Ms. Kuchar has essentially normal coronary arteries and normal left ventricular function.  She will be treated empirically with anti-reflux  measures"  . CATARACT EXTRACTION, BILATERAL    . CHOLECYSTECTOMY    . COLONOSCOPY    06/16/2004   [id number] rectum/Diminutive polyps of the splenic flexure and the cecum/The remainder of the colonic mucosa appeared normal pathology (hyperplastic polyps).  . COLONOSCOPY N/A 08/07/2015   Dr. Gala Romney: 6 mm descending colon tubular adenoma, multiple medium-mouthed diverticula in sigmoid colon. surveillance 2022  . ESOPHAGOGASTRODUODENOSCOPY  09/11/2001   [id number] ring otherwise normal esophagus/Normal stomach/Normal D1 and D2 status post passage of a 56 [place] Maloney dilator  . ESOPHAGOGASTRODUODENOSCOPY N/A 11/11/2017   Procedure: ESOPHAGOGASTRODUODENOSCOPY (EGD);  Surgeon: Daneil Dolin, MD;   [facility];  Service: Endoscopy;  Laterality: N/A;  8:15am  . ESOPHAGOGASTRODUODENOSCOPY (EGD) WITH ESOPHAGEAL DILATION N/A 01/25/2013   Dr. Gala Romney- schatzki's ring- 37 F dilation. small hiatal hernia  . MALONEY DILATION N/A 11/11/2017   Procedure: [name] DILATION;  Surgeon: Daneil Dolin, MD;  [facility];  Service: Endoscopy;  Laterality: N/A;  . TUBAL LIGATION      Current Outpatient Medications  Medication Sig Dispense Refill  . ALPRAZolam (XANAX) 0.5 MG tablet TAKE 1 TABLET(0.5 MG) BY MOUTH TWICE DAILY AS NEEDED FOR ANXIETY 60 tablet 0  . amLODipine (NORVASC) 10 MG tablet TAKE 1 TABLET(10 MG) BY MOUTH AT BEDTIME 90 tablet 3  . aspirin EC 81 MG tablet Take 81 mg by mouth at bedtime.     Marland Kitchen atorvastatin (LIPITOR) 80 MG tablet TAKE 1 TABLET BY MOUTH DAILY 90 tablet 3  . cetirizine (ZYRTEC) 10 MG tablet Take 10 mg by mouth daily.    . cloNIDine (CATAPRES - DOSED IN MG/24 HR) 0.3 mg/24hr patch Place 1 patch (0.3 mg total) onto the skin once a week. 4 patch 12  . EPINEPHrine 0.3 mg/0.3 mL IJ SOAJ injection Inject 0.3 mg into the muscle as needed for anaphylaxis.     . fluticasone (FLONASE) 50 MCG/ACT nasal spray Place 2 sprays into both nostrils daily. 16 g 6  . hydrochlorothiazide (HYDRODIURIL) 25 MG tablet TAKE 1 TABLET(25 MG) BY MOUTH DAILY 90 tablet 2  . hydrOXYzine (ATARAX/VISTARIL) 25 MG tablet Take 0.5-1 tablets (12.5-25 mg total) by mouth every 8 (eight) hours as needed for itching. 20 tablet 0  . ipratropium (ATROVENT) 0.03 % nasal spray USE 2 SPRAYS IN EACH NOSTRIL EVERY 12 HOURS 30 mL 12  . linaclotide (LINZESS) 145 MCG CAPS capsule Take 1 capsule (145 mcg total) by mouth daily before breakfast. (Patient taking differently: Take 145 mcg by mouth as needed. ) 30 capsule 3  . metoprolol succinate (TOPROL-XL) 100 MG 24 hr tablet Take 1 tablet (100 mg total) by mouth daily. Take with or immediately following a meal. 90 tablet 3  . mometasone (ELOCON) 0.1 % cream  Apply 1 application topically daily as needed (for irritation).     . montelukast (SINGULAIR) 10 MG tablet TAKE 1 TABLET(10 MG) BY MOUTH AT BEDTIME 30 tablet 3  . pantoprazole (PROTONIX) 40 MG tablet Take 1 tablet (40 mg total) by mouth 2 (two) times daily. Take 30 minutes before meals 60 tablet 5  . potassium chloride SA (K-DUR) 20 MEQ tablet Take 1 tablet (20 mEq total) by mouth daily. 30 tablet 5  . tacrolimus (PROTOPIC) 0.1 % ointment Apply 1 application topically daily as needed (for irritation).     . triamcinolone cream (KENALOG) 0.1 % APPLY EXTERNALLY TO THE AFFECTED AREA TWICE DAILY (Patient taking differently: as needed. ) 45 g 0  . valsartan (DIOVAN) 320  MG tablet Take 1 tablet (320 mg total) by mouth daily. 30 tablet 3  . vitamin B-12 (CYANOCOBALAMIN) 1000 MCG tablet Take 1,000 mcg by mouth daily.    . vitamin C (ASCORBIC ACID) 500 MG tablet Take 1,000 mg by mouth daily.    Marland Kitchen VITAMIN E PO Take 1 tablet by mouth daily.      No current facility-administered medications for this visit.     Allergies as of 02/12/2019 - Review Complete 02/12/2019  Allergen Reaction Noted  . Ciprofloxacin Shortness Of Breath 12/27/2011    Family History  Problem Relation Age of Onset  . Diabetes Other   . Diabetes Mother   . Heart disease Mother   . Heart disease Father   . Diabetes Sister   . Diabetes Brother   . Hypertension Brother   . Diabetes Daughter   . Hypertension Maternal Grandmother   . Stroke Maternal Grandfather   . Early death Paternal Grandfather   . Colon cancer Neg Hx   . Liver disease Neg Hx     Social History   Socioeconomic History  . Marital status: Single    Spouse name: Not on file  . Number of children: 3  . Years of education: Not on file  . Highest education level: Not on file  Occupational History  . 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 Financial resource strain: Not on file  . Food insecurity    Worry: Not on file     Inability: Not on file  . Transportation needs    Medical: Not on file    Non-medical: Not on file  Tobacco Use  . Smoking status: Former Smoker    Packs/day: 0.25    Years: 35.00    Pack years: 8.75    Types: Cigarettes    Quit date: 12/31/2012    Years since quitting: 6.1  . Smokeless tobacco: Never Used  Substance and Sexual Activity  . Alcohol use: Not Currently    Comment: occasionaly  . Drug use: No  . Sexual activity: Not Currently    Partners: Male    Birth control/protection: Post-menopausal, Surgical    Comment: tubal  Lifestyle  . Physical activity    Days per week: Not on file    Minutes per session: Not on file  . Stress: Not on file  Relationships  . Social Herbalist on phone: Not on file    Gets together: Not on file    Attends religious service: Not on file    Active member of club or organization: Not on file    Attends meetings of clubs or organizations: Not on file    Relationship status: Not on file  Other Topics Concern  . Not on file  Social History Narrative   Eats all food groups.    Wears seatbelt.    Has 3 children.   Prior smoker.   Attends church.    Used to work in [employer] fund[employer].    Drives.   Enjoys going out with family.     Review of Systems: Gen: Denies fever, chills, lightheadedness, dizziness, feeling like she will pass out. CV: Denies chest pain, palpitations Resp: Denies dyspnea at rest, cough, wheezing GI: See HPI Derm: Denies rash Psych: Denies depression, anxiety Heme: Denies bruising, bleeding  Physical Exam: BP (!) 146/79   Pulse 67   Temp (!) 96.2 F (35.7 C) (Temporal)   Ht 5\' 7"  (1.702 m)  Wt 174 lb 6.4 oz (79.1 kg)   BMI 27.31 kg/m  General:   Alert and oriented. No distress noted. Pleasant and cooperative.  Head:  Normocephalic and atraumatic. Eyes:  Conjuctiva clear without scleral icterus. Heart:  S1, S2 present without murmurs appreciated. Lungs:  Clear to auscultation bilaterally. No  wheezes, rales, or rhonchi. No distress.  Abdomen:  +BS, soft, non-tender and non-distended. No rebound or guarding. No HSM or masses noted. Msk:  Symmetrical without gross deformities. Normal posture. Extremities:  Without edema. Neurologic:  Alert and  oriented x4 Psych:  Normal mood and affect.

## 2019-02-12 ENCOUNTER — Encounter: Payer: Self-pay | Admitting: Gastroenterology

## 2019-02-12 ENCOUNTER — Ambulatory Visit: Payer: BC Managed Care – PPO | Admitting: Gastroenterology

## 2019-02-12 ENCOUNTER — Other Ambulatory Visit: Payer: Self-pay

## 2019-02-12 VITALS — BP 146/79 | HR 67 | Temp 96.2°F | Ht 67.0 in | Wt 174.4 lb

## 2019-02-12 DIAGNOSIS — R110 Nausea: Secondary | ICD-10-CM | POA: Diagnosis not present

## 2019-02-12 DIAGNOSIS — K219 Gastro-esophageal reflux disease without esophagitis: Secondary | ICD-10-CM

## 2019-02-12 DIAGNOSIS — R1013 Epigastric pain: Secondary | ICD-10-CM | POA: Diagnosis not present

## 2019-02-12 DIAGNOSIS — K5900 Constipation, unspecified: Secondary | ICD-10-CM | POA: Diagnosis not present

## 2019-02-12 DIAGNOSIS — R634 Abnormal weight loss: Secondary | ICD-10-CM | POA: Diagnosis not present

## 2019-02-12 NOTE — Patient Instructions (Addendum)
Please do not take Nexium.   Continue with Protonix 40 mg twice daily 30 minutes before breakfast and dinner.   You may use over the counter Pepcid or Tums as needed for breakthrough reflux symptoms.   See GERD handout below for dietary and lifestyle recommendations.   Continue Linzess 145 mcg for constipation.   We will plan to see you back in 3 months.   [name], PA-C [facility]  Food Choices for Gastroesophageal Reflux Disease, Adult When you have gastroesophageal reflux disease (GERD), the foods you eat and your eating habits are very important. Choosing the right foods can help ease your discomfort. Think about working with a nutrition specialist (dietitian) to help you make good choices. What are tips for following this plan?  Meals  Choose healthy foods that are low in fat, such as fruits, vegetables, whole grains, low-fat dairy products, and lean meat, fish, and poultry.  Eat small meals often instead of 3 large meals a day. Eat your meals slowly, and in a place where you are relaxed. Avoid bending over or lying down until 2-3 hours after eating.  Avoid eating meals 2-3 hours before bed.  Avoid drinking a lot of liquid with meals.  Cook foods using methods other than frying. Bake, grill, or broil food instead.  Avoid or limit: ? Chocolate. ? Peppermint or spearmint. ? Alcohol. ? Pepper. ? Black and decaffeinated coffee. ? Black and decaffeinated tea. ? Bubbly (carbonated) soft drinks. ? Caffeinated energy drinks and soft drinks.  Limit high-fat foods such as: ? Fatty meat or fried foods. ? Whole milk, cream, butter, or ice cream. ? Nuts and nut butters. ? Pastries, donuts, and sweets made with butter or shortening.  Avoid foods that cause symptoms. These foods may be different for everyone. Common foods that cause symptoms include: ? Tomatoes. ? Oranges, lemons, and limes. ? Peppers. ? Spicy food. ? Onions and  garlic. ? Vinegar. Lifestyle  Maintain a healthy weight. Ask your doctor what weight is healthy for you. If you need to lose weight, work with your doctor to do so safely.  Exercise for at least 30 minutes for 5 or more days each week, or as told by your doctor.  Wear loose-fitting clothes.  Do not smoke. If you need help quitting, ask your doctor.  Sleep with the head of your bed higher than your feet. Use a wedge under the mattress or blocks under the bed frame to raise the head of the bed. Summary  When you have gastroesophageal reflux disease (GERD), food and lifestyle choices are very important in easing your symptoms.  Eat small meals often instead of 3 large meals a day. Eat your meals slowly, and in a place where you are relaxed.  Limit high-fat foods such as fatty meat or fried foods.  Avoid bending over or lying down until 2-3 hours after eating.  Avoid peppermint and spearmint, caffeine, alcohol, and chocolate. This information is not intended to replace advice given to you by your health care provider. Make sure you discuss any questions you have with your health care provider. Document Released: 10/26/2011 Document Revised: 08/17/2018 Document Reviewed: 06/01/2016 Elsevier Patient Education  2020 [employer].

## 2019-02-15 NOTE — Assessment & Plan Note (Addendum)
Overall patients weight is stable. Patient has gained 2 lbs since last visit and 4 lb weight gain since June 2020. Nausea has resolved. Appetite has improved. No early satiety. No epigastric burning, reflux symptoms fairly well controlled. No other alarm symptoms. Suspect weight loss was related to her dietary changes related to knowledge of fatty liver and diabetes. Continue to monitor.

## 2019-02-15 NOTE — Progress Notes (Signed)
CARDIOLOGY CONSULT NOTE       Patient ID: [name] MRN: [id number] DOB/AGE: 65-Jul-1957 65 y.o.  Admit date: (Not on file) Referring Physician: Pickard Primary Physician: [name] Primary Cardiologist: New Reason for Consultation: HTN, elevated troponin   Active Problems:   * No active hospital problems. *   HPI:  63 y.o. with labile HTN, HLD, history of esophageal web with dilatation and GERD Seen in [facility] For hypertensive urgency BP 99991111 systolic. Dr Dennard Schaumann had started her on clonidine patch and adjusted her ARB Complained of fluttering in chest for 4 days Compliant with meds Labs ok except K 3.3 Troponin 18->19 no trend Not sure why checked as she had no chest pain. CXR NAD CT head No acute findings Recommended admission But left AMA ECG non acute with LAE non LVH no acute ST changes 02/02/19 Previous smoker quit in 2014  She follows with Dr Donzetta Matters [facility] She has left bruit with duplex [id number] showing 123456 LICA stenosis stable since Duplex done 10/21/17  Carries Diagnosis of AAA but duplex 11/22/18 showed largest diameter only 2.6 cm   She has no documented history of CAD  Had normal cath 2014 with pain ascribed to GI cause BP seems sub optimally controled She is on statin for HLD  3 children. [name] with her today is a traveling nurse   ROS All other systems reviewed and negative except as noted above  Past Medical History:  Diagnosis Date  . Abdominal aortic aneurysm (AAA) 3.0 cm to 5.0 cm in diameter in female [facility])    None seen on prior imaging?   Marland Kitchen Anxiety   . Cataract   . Chronic left shoulder pain   . Headache   . Hiatal hernia    small  . Hyperlipidemia   . Hypertension   . Pre-diabetes   . Prediabetes   . Schatzki's ring   . Tremor     Family History  Problem Relation Age of Onset  . Diabetes Other   . Diabetes Mother   . Heart disease Mother   . Heart disease Father   . Diabetes Sister   . Diabetes Brother   .  Hypertension Brother   . Diabetes Daughter   . Hypertension Maternal Grandmother   . Stroke Maternal Grandfather   . Early death Paternal Grandfather   . Colon cancer Neg Hx   . Liver disease Neg Hx     Social History   Socioeconomic History  . Marital status: Single    Spouse name: Not on file  . Number of children: 3  . Years of education: Not on file  . Highest education level: Not on file  Occupational History  . 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 Financial resource strain: Not on file  . Food insecurity    Worry: Not on file    Inability: Not on file  . Transportation needs    Medical: Not on file    Non-medical: Not on file  Tobacco Use  . Smoking status: Former Smoker    Packs/day: 0.25    Years: 35.00    Pack years: 8.75    Types: Cigarettes    Quit date: 12/31/2012    Years since quitting: 6.1  . Smokeless tobacco: Never Used  Substance and Sexual Activity  . Alcohol use: Not Currently    Comment: occasionaly  . Drug use: No  . Sexual activity: Not Currently  Partners: Male    Birth control/protection: Post-menopausal, Surgical    Comment: tubal  Lifestyle  . Physical activity    Days per week: Not on file    Minutes per session: Not on file  . Stress: Not on file  Relationships  . Social Herbalist on phone: Not on file    Gets together: Not on file    Attends religious service: Not on file    Active member of club or organization: Not on file    Attends meetings of clubs or organizations: Not on file    Relationship status: Not on file  . Intimate partner violence    Fear of current or ex partner: Not on file    Emotionally abused: Not on file    Physically abused: Not on file    Forced sexual activity: Not on file  Other Topics Concern  . Not on file  Social History Narrative   Eats all food groups.    Wears seatbelt.    Has 3 children.   Prior smoker.   Attends church.    Used to work  in [employer] fundraiser.    Drives.   Enjoys going out with family.     Past Surgical History:  Procedure Laterality Date  . CARDIAC CATHETERIZATION  2005   "[name] has essentially normal coronary arteries and normal left ventricular function.  She will be treated empirically with anti-reflux measures"  . CATARACT EXTRACTION, BILATERAL    . CHOLECYSTECTOMY    . COLONOSCOPY    06/16/2004   [id number] rectum/Diminutive polyps of the splenic flexure and the cecum/The remainder of the colonic mucosa appeared normal pathology (hyperplastic polyps).  . COLONOSCOPY N/A 08/07/2015   Dr. Gala Romney: 6 mm descending colon tubular adenoma, multiple medium-mouthed diverticula in sigmoid colon. surveillance 2022  . ESOPHAGOGASTRODUODENOSCOPY  09/11/2001   [id number] ring otherwise normal esophagus/Normal stomach/Normal D1 and D2 status post passage of a 56 [place] Maloney dilator  . ESOPHAGOGASTRODUODENOSCOPY N/A 11/11/2017   Procedure: ESOPHAGOGASTRODUODENOSCOPY (EGD);  Surgeon: Daneil Dolin, Williams;  Location: [facility];  Service: Endoscopy;  Laterality: N/A;  8:15am  . ESOPHAGOGASTRODUODENOSCOPY (EGD) WITH ESOPHAGEAL DILATION N/A 01/25/2013   Dr. Gala Romney- schatzki's ring- 55 F dilation. small hiatal hernia  . MALONEY DILATION N/A 11/11/2017   Procedure: [name] DILATION;  Surgeon: Daneil Dolin, Williams;  Location: [facility];  Service: Endoscopy;  Laterality: N/A;  . TUBAL LIGATION          Physical Exam: Blood pressure 134/72, pulse (!) 56, temperature (!) 97.3 F (36.3 C), temperature source Temporal, height 5\' 7"  (1.702 m), weight 177 lb (80.3 kg).    Affect appropriate Healthy:  appears stated age 63: normal Neck supple with no adenopathy JVP normal Left bruits no thyromegaly Lungs clear with no wheezing and good diaphragmatic motion Heart:  S1/S2 no murmur, no rub, gallop or click PMI normal Abdomen: benighn, BS positve, no tenderness, no AAA no bruit.  No HSM or HJR Distal pulses intact  with no bruits No edema Neuro non-focal Skin warm and dry No muscular weakness   Labs:   Lab Results  Component Value Date   WBC 9.7 02/01/2019   HGB 14.8 02/01/2019   HCT 44.2 02/01/2019   MCV 90.6 02/01/2019   PLT 291 02/01/2019   No results for input(s): NA, K, CL, CO2, BUN, CREATININE, CALCIUM, PROT, BILITOT, ALKPHOS, ALT, AST, GLUCOSE in the last 168 hours.  Invalid input(s): LABALBU Lab Results  Component  Value Date   TROPONINI <0.03 12/28/2017    Lab Results  Component Value Date   CHOL 157 06/27/2018   CHOL 181 12/09/2017   CHOL 145 07/18/2017   Lab Results  Component Value Date   HDL 66 06/27/2018   HDL 51 12/09/2017   HDL 50 (L) 07/18/2017   Lab Results  Component Value Date   LDLCALC 77 06/27/2018   LDLCALC 111 (H) 12/09/2017   LDLCALC 79 07/18/2017   Lab Results  Component Value Date   TRIG 49 06/27/2018   TRIG 89 12/09/2017   TRIG 75 07/18/2017   Lab Results  Component Value Date   CHOLHDL 2.4 06/27/2018   CHOLHDL 3.5 12/09/2017   CHOLHDL 2.9 07/18/2017   No results found for: LDLDIRECT    Radiology: Dg Chest 2 View  Result Date: 02/01/2019 CLINICAL DATA:  Hypertension EXAM: CHEST - 2 VIEW COMPARISON:  12/28/2017 FINDINGS: Heart is normal size. Aortic atherosclerosis. Lungs clear. No effusions. No acute bony abnormality. IMPRESSION: No active cardiopulmonary disease. Electronically Signed   By: Rolm Baptise M.D.   On: 02/01/2019 20:51   Ct Head Wo Contrast  Result Date: 02/01/2019 CLINICAL DATA:  Headache. Intermittent hypertension with systolic pressures in the 200s. EXAM: CT HEAD WITHOUT CONTRAST TECHNIQUE: Contiguous axial images were obtained from the base of the skull through the vertex without intravenous contrast. COMPARISON:  CT head dated December 29, 2017. FINDINGS: Brain: No evidence of acute infarction, hemorrhage, hydrocephalus, extra-axial collection or mass lesion/mass effect. Unchanged empty sella. Vascular: Atherosclerotic  vascular calcification of the carotid siphons. No hyperdense vessel. Skull: Normal. Negative for fracture or focal lesion. Sinuses/Orbits: No acute finding. Other: None. IMPRESSION: 1.  No acute intracranial abnormality. Electronically Signed   By: Titus Dubin M.D.   On: 02/01/2019 20:38    EKG: See HPI   ASSESSMENT AND PLAN:   1. Elevated Troponin:  In absence of chest pain. Multiple risk factors and known carotid vascular disease Will order echo to assess EF, LVH and lexiscan myovue 2. HTN:  F/u with Dr Dennard Schaumann for continued titration of meds 3.  HLD:  On statin target LDL 70 or less with carotid disease  4. Bruit:  F/U [facility] Dr Donzetta Matters [id number] LICA stable duplex again July 2021 ASA  5. GI: history of GERD and esophageal dilatation seen by GI recently and repeat EGD deferred Continue Linzess and protonix   Signed: Jenkins Rouge 02/20/2019, 10:01 AM

## 2019-02-15 NOTE — Assessment & Plan Note (Signed)
GERD symptoms fairly well controlled on Protonix 40 mg twice daily.  Breakthrough symptoms maybe once a week.  Her prior epigastric burning, nausea, and early satiety have resolved. Appetite has improved. Gained 2 pounds since last visit in July 2020.  Continues with occasional dysphasia to meats that hang around sternal notch but moved down within a few seconds.  Patient does not feel symptoms are severe enough for repeat esophageal dilation at this point. No regular NSAIDs other than baby aspirin. Benign abdominal exam.   Continue Protonix 40 mg BID May use Tums or Pepcid for breakthrough symptoms.  GERD dietary and lifestyle recommendations discussed.  Handout provided. Follow-up in 3 months.

## 2019-02-15 NOTE — Assessment & Plan Note (Addendum)
Epigastric burning has resolved. Occasional "funny feeling" that feels like a spasm/butterly sensation across upper abdomen 2-3 times a week.  Last a second or 2.  This is nonspecific.  Doubt any significant underlying GI issue as patient has had similar symptoms in the past that have occurred in her upper or lower abdomen.  Suspect possible muscle spasm. Continue to monitor.   Follow-up in 3 months.

## 2019-02-15 NOTE — Assessment & Plan Note (Signed)
Resolved

## 2019-02-15 NOTE — Assessment & Plan Note (Signed)
Well controlled on Linzess 145 mcg daily. No lower abdominal pain or alarm features. TCS up-to-date.  Due for repeat in 2022.  Continue Linzess 145 mcg daily.

## 2019-02-19 ENCOUNTER — Other Ambulatory Visit: Payer: Self-pay

## 2019-02-20 ENCOUNTER — Ambulatory Visit: Payer: Medicare Other | Admitting: Cardiovascular Disease

## 2019-02-20 ENCOUNTER — Encounter: Payer: Self-pay | Admitting: Cardiovascular Disease

## 2019-02-20 ENCOUNTER — Ambulatory Visit (INDEPENDENT_AMBULATORY_CARE_PROVIDER_SITE_OTHER): Payer: Medicare Other | Admitting: Family Medicine

## 2019-02-20 VITALS — BP 100/56 | HR 69 | Temp 98.6°F | Resp 18 | Ht 67.0 in | Wt 178.0 lb

## 2019-02-20 VITALS — BP 134/72 | HR 56 | Temp 97.3°F | Ht 67.0 in | Wt 177.0 lb

## 2019-02-20 DIAGNOSIS — R0600 Dyspnea, unspecified: Secondary | ICD-10-CM | POA: Diagnosis not present

## 2019-02-20 DIAGNOSIS — Z23 Encounter for immunization: Secondary | ICD-10-CM | POA: Diagnosis not present

## 2019-02-20 DIAGNOSIS — R079 Chest pain, unspecified: Secondary | ICD-10-CM

## 2019-02-20 DIAGNOSIS — I10 Essential (primary) hypertension: Secondary | ICD-10-CM | POA: Diagnosis not present

## 2019-02-20 MED ORDER — CLONIDINE HCL 0.1 MG PO TABS: 0.1000 mg | ORAL_TABLET | Freq: Three times a day (TID) | ORAL | 3 refills | Status: DC | PRN

## 2019-02-20 NOTE — Progress Notes (Signed)
Subjective:    Patient ID: [name], female    DOB: Jun 12, 1955, 63 y.o.   MRN: [id number]  HPI  02/05/19 Was seen last week in the emergency room.  Blood pressure was greater than A999333 systolic.  Patient states that she was having a dull headache, some vision changes, and oliguria.  She went to the emergency room.  2 sets of troponins were borderline at 18 and 19.  The remainder of her lab work was normal.  Patient was discharged home however her blood pressure has remained elevated since.  She states that her systolic blood pressures between 160 and 180.  She is consistently taking her amlodipine, losartan, metoprolol, and hydrochlorothiazide.  She has taken 2 doses of clonidine when her blood pressure becomes really high however the medication makes her sleepy.  She denies any chest pain or shortness of breath or dyspnea on exertion today.  At that time, my plan was: Blood pressure is elevated.  We will start clonidine 0.3 mg per 24-hour transdermal patch.  Patient also states that her blood pressure has been more difficult to control since she switched from valsartan to losartan.  Therefore we will discontinue losartan and switch the patient back to valsartan 320 mg daily.  I will recheck the patient's blood pressure in 1 week or sooner if symptoms develop.  02/20/19 Since switching losartan to valsartan and starting the clonidine patches, the patient's blood pressure has plummeted.  The majority of her systolic blood pressures are between 101 110.  She denies any syncope or near syncope however she does report feeling fatigued.  She also reports sleepiness taking the clonidine.  She denies any headaches.  She has yet to see a systolic blood pressure greater than 100 since making these changes  Past Medical History:  Diagnosis Date   Abdominal aortic aneurysm (AAA) 3.0 cm to 5.0 cm in diameter in female [facility])    None seen on prior imaging?    Anxiety    Cataract    Chronic left  shoulder pain    Headache    Hiatal hernia    small   Hyperlipidemia    Hypertension    Pre-diabetes    Prediabetes    Schatzki's ring    Tremor    Past Surgical History:  Procedure Laterality Date   CARDIAC CATHETERIZATION  2005   "Ms. Clippard has essentially normal coronary arteries and normal left ventricular function.  She will be treated empirically with anti-reflux measures"   CATARACT EXTRACTION, BILATERAL     CHOLECYSTECTOMY     COLONOSCOPY    06/16/2004   [id number] rectum/Diminutive polyps of the splenic flexure and the cecum/The remainder of the colonic mucosa appeared normal pathology (hyperplastic polyps).   COLONOSCOPY N/A 08/07/2015   Dr. Gala Romney: 6 mm descending colon tubular adenoma, multiple medium-mouthed diverticula in sigmoid colon. surveillance 2022   ESOPHAGOGASTRODUODENOSCOPY  09/11/2001   [id number] ring otherwise normal esophagus/Normal stomach/Normal D1 and D2 status post passage of a 56 [place] Maloney dilator   ESOPHAGOGASTRODUODENOSCOPY N/A 11/11/2017   Procedure: ESOPHAGOGASTRODUODENOSCOPY (EGD);  Surgeon: Daneil Dolin, MD;  Location[facility];  Service: Endoscopy;  Laterality: N/A;  8:15am   ESOPHAGOGASTRODUODENOSCOPY (EGD) WITH ESOPHAGEAL DILATION N/A 01/25/2013   Dr. Gala Romney- schatzki's ring- 68 F dilation. small hiatal hernia   MALONEY DILATION N/A 11/11/2017   Procedure: [name] DILATION;  Surgeon: Daneil Dolin, MD;  Location: [facility];  Service: Endoscopy;  Laterality: N/A;   TUBAL LIGATION  Current Outpatient Medications on File Prior to Visit  Medication Sig Dispense Refill   ALPRAZolam (XANAX) 0.5 MG tablet TAKE 1 TABLET(0.5 MG) BY MOUTH TWICE DAILY AS NEEDED FOR ANXIETY 60 tablet 0   amLODipine (NORVASC) 10 MG tablet TAKE 1 TABLET(10 MG) BY MOUTH AT BEDTIME 90 tablet 3   aspirin EC 81 MG tablet Take 81 mg by mouth at bedtime.      atorvastatin (LIPITOR) 80 MG tablet TAKE 1 TABLET BY MOUTH DAILY 90 tablet 3    cetirizine (ZYRTEC) 10 MG tablet Take 10 mg by mouth daily.     cloNIDine (CATAPRES - DOSED IN MG/24 HR) 0.3 mg/24hr patch Place 1 patch (0.3 mg total) onto the skin once a week. 4 patch 12   EPINEPHrine 0.3 mg/0.3 mL IJ SOAJ injection Inject 0.3 mg into the muscle as needed for anaphylaxis.      fluticasone (FLONASE) 50 MCG/ACT nasal spray Place 2 sprays into both nostrils daily. 16 g 6   hydrochlorothiazide (HYDRODIURIL) 25 MG tablet TAKE 1 TABLET(25 MG) BY MOUTH DAILY 90 tablet 2   hydrOXYzine (ATARAX/VISTARIL) 25 MG tablet Take 0.5-1 tablets (12.5-25 mg total) by mouth every 8 (eight) hours as needed for itching. 20 tablet 0   ipratropium (ATROVENT) 0.03 % nasal spray USE 2 SPRAYS IN EACH NOSTRIL EVERY 12 HOURS 30 mL 12   linaclotide (LINZESS) 145 MCG CAPS capsule Take 1 capsule (145 mcg total) by mouth daily before breakfast. (Patient taking differently: Take 145 mcg by mouth as needed. ) 30 capsule 3   metoprolol succinate (TOPROL-XL) 100 MG 24 hr tablet Take 1 tablet (100 mg total) by mouth daily. Take with or immediately following a meal. 90 tablet 3   mometasone (ELOCON) 0.1 % cream Apply 1 application topically daily as needed (for irritation).      montelukast (SINGULAIR) 10 MG tablet TAKE 1 TABLET(10 MG) BY MOUTH AT BEDTIME 30 tablet 3   pantoprazole (PROTONIX) 40 MG tablet Take 1 tablet (40 mg total) by mouth 2 (two) times daily. Take 30 minutes before meals 60 tablet 5   potassium chloride SA (K-DUR) 20 MEQ tablet Take 1 tablet (20 mEq total) by mouth daily. 30 tablet 5   tacrolimus (PROTOPIC) 0.1 % ointment Apply 1 application topically daily as needed (for irritation).      triamcinolone cream (KENALOG) 0.1 % APPLY EXTERNALLY TO THE AFFECTED AREA TWICE DAILY (Patient taking differently: as needed. ) 45 g 0   valsartan (DIOVAN) 320 MG tablet Take 1 tablet (320 mg total) by mouth daily. 30 tablet 3   vitamin B-12 (CYANOCOBALAMIN) 1000 MCG tablet Take 1,000 mcg by  mouth daily.     vitamin C (ASCORBIC ACID) 500 MG tablet Take 1,000 mg by mouth daily.     VITAMIN E PO Take 1 tablet by mouth daily.      No current facility-administered medications on file prior to visit.    Allergies  Allergen Reactions   Ciprofloxacin Shortness Of Breath   Social History   Socioeconomic History   Marital status: Single    Spouse name: Not on file   Number of children: 3   Years of education: Not on file   Highest education level: Not on file  Occupational History   Occupation: English as a second language teacher: [employer]  Social Needs   Financial resource strain: Not on file   Food insecurity    Worry: Not on file    Inability: Not on file  Transportation needs    Medical: Not on file    Non-medical: Not on file  Tobacco Use   Smoking status: Former Smoker    Packs/day: 0.25    Years: 35.00    Pack years: 8.75    Types: Cigarettes    Quit date: 12/31/2012    Years since quitting: 6.1   Smokeless tobacco: Never Used  Substance and Sexual Activity   Alcohol use: Not Currently    Comment: occasionaly   Drug use: No   Sexual activity: Not Currently    Partners: Male    Birth control/protection: Post-menopausal, Surgical    Comment: tubal  Lifestyle   Physical activity    Days per week: Not on file    Minutes per session: Not on file   Stress: Not on file  Relationships   Social connections    Talks on phone: Not on file    Gets together: Not on file    Attends religious service: Not on file    Active member of club or organization: Not on file    Attends meetings of clubs or organizations: Not on file    Relationship status: Not on file   Intimate partner violence    Fear of current or ex partner: Not on file    Emotionally abused: Not on file    Physically abused: Not on file    Forced sexual activity: Not on file  Other Topics Concern   Not on file  Social History Narrative   Eats all food groups.    Wears  seatbelt.    Has 3 children.   Prior smoker.   Attends church.    Used to work in Char[employer] fundraiser.    Drives.   Enjoys going out with family.    Family History  Problem Relation Age of Onset   Diabetes Other    Diabetes Mother    Heart disease Mother    Heart disease Father    Diabetes Sister    Diabetes Brother    Hypertension Brother    Diabetes Daughter    Hypertension Maternal Grandmother    Stroke Maternal Grandfather    Early death Paternal Grandfather    Colon cancer Neg Hx    Liver disease Neg Hx       Review of Systems  All other systems reviewed and are negative.      Objective:   Physical Exam Vitals signs reviewed.  Constitutional:      Appearance: She is well-developed.  Cardiovascular:     Rate and Rhythm: Normal rate and regular rhythm.     Heart sounds: Normal heart sounds. No murmur. No friction rub. No gallop.   Pulmonary:     Effort: Pulmonary effort is normal. No respiratory distress.     Breath sounds: Normal breath sounds. No stridor. No wheezing or rales.  Chest:     Chest wall: No tenderness.  Neurological:     Motor: No abnormal muscle tone.           Assessment & Plan:  Benign essential HTN - Plan: Ambulatory referral to Sleep Studies  Patient has responded dramatically to the blood pressure medication.  I have advised the patient to discontinue the clonidine patches.  Instead she will simply take her current medications including valsartan and monitor her blood pressure.  If her systolic blood pressures greater than 150 she can take clonidine 0.1 mg tablet every 8 hours as needed and we will monitor the patient see how  often she truly requires the clonidine rather than overmedicate the patient.  She also request a referral back to her neurologist for a sleep study.

## 2019-02-20 NOTE — Patient Instructions (Signed)
Medication Instructions:  Your physician recommends that you continue on your current medications as directed. Please refer to the Current Medication list given to you today.    Labwork: none  Testing/Procedures: Your physician has requested that you have an echocardiogram. Echocardiography is a painless test that uses sound waves to create images of your heart. It provides your doctor with information about the size and shape of your heart and how well your heart's chambers and valves are working. This procedure takes approximately one hour. There are no restrictions for this procedure.  Your physician has requested that you have en exercise stress myoview. For further information please visit [email or web address]. Please follow instruction sheet, as given.    Follow-Up: Your physician wants you to follow-up in: 1 year.  You will receive a reminder letter in the mail two months in advance. If you don't receive a letter, please call our office to schedule the follow-up appointment.   Any Other Special Instructions Will Be Listed Below (If Applicable).     If you need a refill on your cardiac medications before your next appointment, please call your pharmacy.

## 2019-02-21 ENCOUNTER — Other Ambulatory Visit: Payer: Self-pay | Admitting: Obstetrics and Gynecology

## 2019-02-21 ENCOUNTER — Encounter: Payer: Self-pay | Admitting: Obstetrics and Gynecology

## 2019-02-21 ENCOUNTER — Other Ambulatory Visit: Payer: Self-pay

## 2019-02-21 ENCOUNTER — Ambulatory Visit (INDEPENDENT_AMBULATORY_CARE_PROVIDER_SITE_OTHER): Payer: Medicare Other | Admitting: Obstetrics and Gynecology

## 2019-02-21 VITALS — BP 147/67 | HR 69 | Ht 67.0 in | Wt 176.2 lb

## 2019-02-21 DIAGNOSIS — N840 Polyp of corpus uteri: Secondary | ICD-10-CM | POA: Diagnosis not present

## 2019-02-21 DIAGNOSIS — N950 Postmenopausal bleeding: Secondary | ICD-10-CM

## 2019-02-21 DIAGNOSIS — N939 Abnormal uterine and vaginal bleeding, unspecified: Secondary | ICD-10-CM | POA: Diagnosis not present

## 2019-02-21 NOTE — Progress Notes (Signed)
Patient ID: [name], female   DOB: 1955/08/31, 63 y.o.   MRN: [id number]  Preoperative History and Physical  [name] is a 63 y.o. [id number] here for surgical management of postmenopausal bleeding since 2019.Marland Kitchen Ultrasound suggested a  thickened endometrium. 14 mm thickness. Sonohysterogram was done, and biopsy indicated an endometrial polyp. No cancer or precancerous changes noted. No longer having discharge or bleeding.  No significant preoperative concerns. Dr. Dennard Schaumann switched her BP medication due to her increase in BP  Proposed surgery: D&C w/ hysteroscopy removal of polyp  Past Medical History:  Diagnosis Date  . Abdominal aortic aneurysm (AAA) 3.0 cm to 5.0 cm in diameter in female [facility])    None seen on prior imaging?   Marland Kitchen Anxiety   . Cataract   . Chronic left shoulder pain   . Headache   . Hiatal hernia    small  . Hyperlipidemia   . Hypertension   . Pre-diabetes   . Prediabetes   . Schatzki's ring   . Tremor    Past Surgical History:  Procedure Laterality Date  . CARDIAC CATHETERIZATION  2005   "Ms. Messano has essentially normal coronary arteries and normal left ventricular function.  She will be treated empirically with anti-reflux measures"  . CATARACT EXTRACTION, BILATERAL    . CHOLECYSTECTOMY    . COLONOSCOPY    06/16/2004   [id number] rectum/Diminutive polyps of the splenic flexure and the cecum/The remainder of the colonic mucosa appeared normal pathology (hyperplastic polyps).  . COLONOSCOPY N/A 08/07/2015   Dr. Gala Romney: 6 mm descending colon tubular adenoma, multiple medium-mouthed diverticula in sigmoid colon. surveillance 2022  . ESOPHAGOGASTRODUODENOSCOPY  09/11/2001   [id number] ring otherwise normal esophagus/Normal stomach/Normal D1 and D2 status post passage of a 56 [place]oney dilator  . ESOPHAGOGASTRODUODENOSCOPY N/A 11/11/2017   Procedure: ESOPHAGOGASTRODUODENOSCOPY (EGD);  Surgeon: Daneil Dolin, MD;  [facility];   Service: Endoscopy;  Laterality: N/A;  8:15am  . ESOPHAGOGASTRODUODENOSCOPY (EGD) WITH ESOPHAGEAL DILATION N/A 01/25/2013   Dr. Gala Romney- schatzki's ring- 49 F dilation. small hiatal hernia  . MALONEY DILATION N/A 11/11/2017   Procedure: [name] DILATION;  Surgeon: Daneil Dolin, MD;  Location: [facility];  Service: Endoscopy;  Laterality: N/A;  . TUBAL LIGATION     OB History  Gravida Para Term Preterm AB Living  4 3 3   1 3   SAB TAB Ectopic Multiple Live Births  1            # Outcome Date GA Lbr Len/2nd Weight Sex Delivery Anes PTL Lv  4 SAB           3 Term           2 Term           1 Term           Patient denies any other pertinent gynecologic issues.   Current Outpatient Medications on File Prior to Visit  Medication Sig Dispense Refill  . ALPRAZolam (XANAX) 0.5 MG tablet TAKE 1 TABLET(0.5 MG) BY MOUTH TWICE DAILY AS NEEDED FOR ANXIETY 60 tablet 0  . amLODipine (NORVASC) 10 MG tablet TAKE 1 TABLET(10 MG) BY MOUTH AT BEDTIME 90 tablet 3  . aspirin EC 81 MG tablet Take 81 mg by mouth at bedtime.     Marland Kitchen atorvastatin (LIPITOR) 80 MG tablet TAKE 1 TABLET BY MOUTH DAILY 90 tablet 3  . cloNIDine (CATAPRES) 0.1 MG tablet Take 1 tablet (0.1 mg total) by  mouth 3 (three) times daily as needed (for high blood pressure >150). 90 tablet 3  . EPINEPHrine 0.3 mg/0.3 mL IJ SOAJ injection Inject 0.3 mg into the muscle as needed for anaphylaxis.     . fluticasone (FLONASE) 50 MCG/ACT nasal spray Place 2 sprays into both nostrils daily. 16 g 6  . hydrochlorothiazide (HYDRODIURIL) 25 MG tablet TAKE 1 TABLET(25 MG) BY MOUTH DAILY 90 tablet 2  . hydrOXYzine (ATARAX/VISTARIL) 25 MG tablet Take 0.5-1 tablets (12.5-25 mg total) by mouth every 8 (eight) hours as needed for itching. 20 tablet 0  . ipratropium (ATROVENT) 0.03 % nasal spray USE 2 SPRAYS IN EACH NOSTRIL EVERY 12 HOURS 30 mL 12  . linaclotide (LINZESS) 145 MCG CAPS capsule Take 1 capsule (145 mcg total) by mouth daily before breakfast.  (Patient taking differently: Take 145 mcg by mouth as needed. ) 30 capsule 3  . metoprolol succinate (TOPROL-XL) 100 MG 24 hr tablet Take 1 tablet (100 mg total) by mouth daily. Take with or immediately following a meal. 90 tablet 3  . mometasone (ELOCON) 0.1 % cream Apply 1 application topically daily as needed (for irritation).     . montelukast (SINGULAIR) 10 MG tablet TAKE 1 TABLET(10 MG) BY MOUTH AT BEDTIME 30 tablet 3  . pantoprazole (PROTONIX) 40 MG tablet Take 1 tablet (40 mg total) by mouth 2 (two) times daily. Take 30 minutes before meals 60 tablet 5  . potassium chloride SA (K-DUR) 20 MEQ tablet Take 1 tablet (20 mEq total) by mouth daily. 30 tablet 5  . tacrolimus (PROTOPIC) 0.1 % ointment Apply 1 application topically daily as needed (for irritation).     . triamcinolone cream (KENALOG) 0.1 % APPLY EXTERNALLY TO THE AFFECTED AREA TWICE DAILY (Patient taking differently: as needed. ) 45 g 0  . valsartan (DIOVAN) 320 MG tablet Take 1 tablet (320 mg total) by mouth daily. 30 tablet 3  . vitamin B-12 (CYANOCOBALAMIN) 1000 MCG tablet Take 1,000 mcg by mouth daily.    . vitamin C (ASCORBIC ACID) 500 MG tablet Take 1,000 mg by mouth daily.    Marland Kitchen VITAMIN E PO Take 1 tablet by mouth daily.      No current facility-administered medications on file prior to visit.    Allergies  Allergen Reactions  . Ciprofloxacin Shortness Of Breath    Social History:   reports that she quit smoking about 6 years ago. Her smoking use included cigarettes. She has a 8.75 pack-year smoking history. She has never used smokeless tobacco. She reports previous alcohol use. She reports that she does not use drugs.  Family History  Problem Relation Age of Onset  . Diabetes Other   . Diabetes Mother   . Heart disease Mother   . Heart disease Father   . Diabetes Sister   . Diabetes Brother   . Hypertension Brother   . Diabetes Daughter   . Hypertension Maternal Grandmother   . Stroke Maternal Grandfather   .  Early death Paternal Grandfather   . Colon cancer Neg Hx   . Liver disease Neg Hx     Review of Systems: Noncontributory  PHYSICAL EXAM: There were no vitals taken for this visit. General appearance - alert, well appearing, and in no distress Chest - clear to auscultation, no wheezes, rales or rhonchi, symmetric air entry Heart - normal rate and regular rhythm Abdomen - soft, nontender, nondistended, no masses or organomegaly Pelvic - Pelvic exam: VAGINA: no cystocele or rectocele, CERVIX: normal appearing  cervix with good support. UTERUS: uterus is normal size, shape, consistency and nontender.  Extremities - peripheral pulses normal, no pedal edema, no clubbing or cyanosis  Labs: No results found for this or any previous visit (from the past 336 hour(s)).  Imaging Studies: Dg Chest 2 View  Result Date: 02/01/2019 CLINICAL DATA:  Hypertension EXAM: CHEST - 2 VIEW COMPARISON:  12/28/2017 FINDINGS: Heart is normal size. Aortic atherosclerosis. Lungs clear. No effusions. No acute bony abnormality. IMPRESSION: No active cardiopulmonary disease. Electronically Signed   By: Rolm Baptise M.D.   On: 02/01/2019 20:51   Ct Head Wo Contrast  Result Date: 02/01/2019 CLINICAL DATA:  Headache. Intermittent hypertension with systolic pressures in the 200s. EXAM: CT HEAD WITHOUT CONTRAST TECHNIQUE: Contiguous axial images were obtained from the base of the skull through the vertex without intravenous contrast. COMPARISON:  CT head dated December 29, 2017. FINDINGS: Brain: No evidence of acute infarction, hemorrhage, hydrocephalus, extra-axial collection or mass lesion/mass effect. Unchanged empty sella. Vascular: Atherosclerotic vascular calcification of the carotid siphons. No hyperdense vessel. Skull: Normal. Negative for fracture or focal lesion. Sinuses/Orbits: No acute finding. Other: None. IMPRESSION: 1.  No acute intracranial abnormality. Electronically Signed   By: Titus Dubin M.D.   On:  02/01/2019 20:38    Assessment: Patient Active Problem List   Diagnosis Date Noted  . Nausea without vomiting 12/06/2018  . Loss of weight 12/06/2018  . Controlled diabetes mellitus type 2 with complications ([place]) 123XX123  . Essential hypertension 04/25/2017  . Hyperlipidemia LDL goal <70 04/25/2017  . Carotid artery stenosis without cerebral infarction, bilateral 04/25/2017  . AAA (abdominal aortic aneurysm) without rupture ([place]) 04/25/2017  . History of colonic polyps   . Diverticulosis of colon without hemorrhage   . RUQ pain 02/21/2013  . Abdominal pain, epigastric 01/05/2013  . Abdominal bruit 01/05/2013  . Esophageal dysphagia 01/05/2013  . Constipation 01/05/2013  . GERD (gastroesophageal reflux disease) 01/05/2013    Plan: Patient will undergo surgical management with D&C hysteroscopy and polypectomy  On 06 Mar 2019.   By signing my name below, I, Samul Dada, attest that this documentation has been prepared under the direction and in the presence of Jonnie Kind, MD. Electronically Signed: [place]. 02/21/19. 10:34 AM.  I personally performed the services described in this documentation, which was SCRIBED in my presence. The recorded information has been reviewed and considered accurate. It has been edited as necessary during review. Jonnie Kind, MD

## 2019-02-23 ENCOUNTER — Other Ambulatory Visit: Payer: Self-pay | Admitting: Family Medicine

## 2019-02-27 ENCOUNTER — Ambulatory Visit: Payer: Medicare Other | Admitting: Neurology

## 2019-02-27 NOTE — Patient Instructions (Signed)
[name]  02/27/2019     @PREFPERIOPPHARMACY @   Your procedure is scheduled on  03/06/2019 .  Report to [facility] at  0730   A.M.  Call this number if you have problems the morning of surgery:  [phone]   Remember:  Do not eat or drink after midnight.                        Take these medicines the morning of surgery with A SIP OF WATER xanax(if needed), clonidine, hydroxyzine, protonix.    Do not wear jewelry, make-up or nail polish.  Do not wear lotions, powders, or perfumes, or deodorant.  Do not shave 48 hours prior to surgery.  Men may shave face and neck.  Do not bring valuables to the hospital.  [facility] is not responsible for any belongings or valuables.  Contacts, dentures or bridgework may not be worn into surgery.  Leave your suitcase in the car.  After surgery it may be brought to your room.  For patients admitted to the hospital, discharge time will be determined by your treatment team.  Patients discharged the day of surgery will not be allowed to drive home.   Name and phone number of your driver:   family Special instructions:  None  Please read over the following fact sheets that you were given. Anesthesia Post-op Instructions and Care and Recovery After Surgery       Dilation and Curettage or Vacuum Curettage, Care After These instructions give you information about caring for yourself after your procedure. Your doctor may also give you more specific instructions. Call your doctor if you have any problems or questions after your procedure. Follow these instructions at home: Activity  Do not drive or use heavy machinery while taking prescription pain medicine.  For 24 hours after your procedure, avoid driving.  Take short walks often, followed by rest periods. Ask your doctor what activities are safe for you. After one or two days, you may be able to return to your normal activities.  Do not lift anything that is  heavier than 10 lb (4.5 kg) until your doctor approves.  For at least 2 weeks, or as long as told by your doctor: ? Do not douche. ? Do not use tampons. ? Do not have sex. General instructions   Take over-the-counter and prescription medicines only as told by your doctor. This is very important if you take blood thinning medicine.  Do not take baths, swim, or use a hot tub until your doctor approves. Take showers instead of baths.  Wear compression stockings as told by your doctor.  It is up to you to get the results of your procedure. Ask your doctor when your results will be ready.  Keep all follow-up visits as told by your doctor. This is important. Contact a doctor if:  You have very bad cramps that get worse or do not get better with medicine.  You have very bad pain in your belly (abdomen).  You cannot drink fluids without throwing up (vomiting).  You get pain in a different part of the area between your belly and thighs (pelvis).  You have bad-smelling discharge from your vagina.  You have a rash. Get help right away if:  You are bleeding a lot from your vagina. A lot of bleeding means soaking more than one sanitary pad in an hour, for 2 hours in a  row.  You have clumps of blood (blood clots) coming from your vagina.  You have a fever or chills.  Your belly feels very tender or hard.  You have chest pain.  You have trouble breathing.  You cough up blood.  You feel dizzy.  You feel light-headed.  You pass out (faint).  You have pain in your neck or shoulder area. Summary  Take short walks often, followed by rest periods. Ask your doctor what activities are safe for you. After one or two days, you may be able to return to your normal activities.  Do not lift anything that is heavier than 10 lb (4.5 kg) until your doctor approves.  Do not take baths, swim, or use a hot tub until your doctor approves. Take showers instead of baths.  Contact your doctor  if you have any symptoms of infection, like bad-smelling discharge from your vagina. This information is not intended to replace advice given to you by your health care provider. Make sure you discuss any questions you have with your health care provider. Document Released: 02/03/2008 Document Revised: 04/08/2017 Document Reviewed: 01/12/2016 Elsevier Patient Education  2020 [employer]. How to Use Chlorhexidine for Bathing Chlorhexidine gluconate (CHG) is a germ-killing (antiseptic) solution that is used to clean the skin. It can get rid of the bacteria that normally live on the skin and can keep them away for about 24 hours. To clean your skin with CHG, you may be given:  A CHG solution to use in the shower or as part of a sponge bath.  A prepackaged cloth that contains CHG. Cleaning your skin with CHG may help lower the risk for infection:  While you are staying in the intensive care unit of the hospital.  If you have a vascular access, such as a central line, to provide short-term or long-term access to your veins.  If you have a catheter to drain urine from your bladder.  If you are on a ventilator. A ventilator is a machine that helps you breathe by moving air in and out of your lungs.  After surgery. What are the risks? Risks of using CHG include:  A skin reaction.  Hearing loss, if CHG gets in your ears.  Eye injury, if CHG gets in your eyes and is not rinsed out.  The CHG product catching fire. Make sure that you avoid smoking and flames after applying CHG to your skin. Do not use CHG:  If you have a chlorhexidine allergy or have previously reacted to chlorhexidine.  On babies younger than 25 months of age. How to use CHG solution  Use CHG only as told by your health care provider, and follow the instructions on the label.  Use the full amount of CHG as directed. Usually, this is one bottle. During a shower Follow these steps when using CHG solution during a  shower (unless your health care provider gives you different instructions): 1. Start the shower. 2. Use your normal soap and shampoo to wash your face and hair. 3. Turn off the shower or move out of the shower stream. 4. Pour the CHG onto a clean washcloth. Do not use any type of brush or rough-edged sponge. 5. Starting at your neck, lather your body down to your toes. Make sure you follow these instructions: ? If you will be having surgery, pay special attention to the part of your body where you will be having surgery. Scrub this area for at least 1 minute. ?  Do not use CHG on your head or face. If the solution gets into your ears or eyes, rinse them well with water. ? Avoid your genital area. ? Avoid any areas of skin that have broken skin, cuts, or scrapes. ? Scrub your back and under your arms. Make sure to wash skin folds. 6. Let the lather sit on your skin for 1-2 minutes or as long as told by your health care provider. 7. Thoroughly rinse your entire body in the shower. Make sure that all body creases and crevices are rinsed well. 8. Dry off with a clean towel. Do not put any substances on your body afterward-such as powder, lotion, or perfume-unless you are told to do so by your health care provider. Only use lotions that are recommended by the manufacturer. 9. Put on clean clothes or pajamas. 10. If it is the night before your surgery, sleep in clean sheets.  During a sponge bath Follow these steps when using CHG solution during a sponge bath (unless your health care provider gives you different instructions): 1. Use your normal soap and shampoo to wash your face and hair. 2. Pour the CHG onto a clean washcloth. 3. Starting at your neck, lather your body down to your toes. Make sure you follow these instructions: ? If you will be having surgery, pay special attention to the part of your body where you will be having surgery. Scrub this area for at least 1 minute. ? Do not use CHG on  your head or face. If the solution gets into your ears or eyes, rinse them well with water. ? Avoid your genital area. ? Avoid any areas of skin that have broken skin, cuts, or scrapes. ? Scrub your back and under your arms. Make sure to wash skin folds. 4. Let the lather sit on your skin for 1-2 minutes or as long as told by your health care provider. 5. Using a different clean, wet washcloth, thoroughly rinse your entire body. Make sure that all body creases and crevices are rinsed well. 6. Dry off with a clean towel. Do not put any substances on your body afterward-such as powder, lotion, or perfume-unless you are told to do so by your health care provider. Only use lotions that are recommended by the manufacturer. 7. Put on clean clothes or pajamas. 8. If it is the night before your surgery, sleep in clean sheets. How to use CHG prepackaged cloths  Only use CHG cloths as told by your health care provider, and follow the instructions on the label.  Use the CHG cloth on clean, dry skin.  Do not use the CHG cloth on your head or face unless your health care provider tells you to.  When washing with the CHG cloth: ? Avoid your genital area. ? Avoid any areas of skin that have broken skin, cuts, or scrapes. Before surgery Follow these steps when using a CHG cloth to clean before surgery (unless your health care provider gives you different instructions): 1. Using the CHG cloth, vigorously scrub the part of your body where you will be having surgery. Scrub using a back-and-forth motion for 3 minutes. The area on your body should be completely wet with CHG when you are done scrubbing. 2. Do not rinse. Discard the cloth and let the area air-dry. Do not put any substances on the area afterward, such as powder, lotion, or perfume. 3. Put on clean clothes or pajamas. 4. If it is the night before your surgery,  sleep in clean sheets.  For general bathing Follow these steps when using CHG cloths  for general bathing (unless your health care provider gives you different instructions). 1. Use a separate CHG cloth for each area of your body. Make sure you wash between any folds of skin and between your fingers and toes. Wash your body in the following order, switching to a new cloth after each step: ? The front of your neck, shoulders, and chest. ? Both of your arms, under your arms, and your hands. ? Your stomach and groin area, avoiding the genitals. ? Your right leg and foot. ? Your left leg and foot. ? The back of your neck, your back, and your buttocks. 2. Do not rinse. Discard the cloth and let the area air-dry. Do not put any substances on your body afterward-such as powder, lotion, or perfume-unless you are told to do so by your health care provider. Only use lotions that are recommended by the manufacturer. 3. Put on clean clothes or pajamas. Contact a health care provider if:  Your skin gets irritated after scrubbing.  You have questions about using your solution or cloth. Get help right away if:  Your eyes become very red or swollen.  Your eyes itch badly.  Your skin itches badly and is red or swollen.  Your hearing changes.  You have trouble seeing.  You have swelling or tingling in your mouth or throat.  You have trouble breathing.  You swallow any chlorhexidine. Summary  Chlorhexidine gluconate (CHG) is a germ-killing (antiseptic) solution that is used to clean the skin. Cleaning your skin with CHG may help to lower your risk for infection.  You may be given CHG to use for bathing. It may be in a bottle or in a prepackaged cloth to use on your skin. Carefully follow your health care provider's instructions and the instructions on the product label.  Do not use CHG if you have a chlorhexidine allergy.  Contact your health care provider if your skin gets irritated after scrubbing. This information is not intended to replace advice given to you by your health  care provider. Make sure you discuss any questions you have with your health care provider. Document Released: 01/19/2012 Document Revised: 07/13/2018 Document Reviewed: 03/24/2017 Elsevier Patient Education  2020 [employer].

## 2019-03-02 ENCOUNTER — Other Ambulatory Visit (HOSPITAL_COMMUNITY)
Admission: RE | Admit: 2019-03-02 | Discharge: 2019-03-02 | Disposition: A | Discharge status: Home / Self Care | Payer: Medicare Other | Source: Ambulatory Visit | Attending: Obstetrics and Gynecology | Admitting: Obstetrics and Gynecology

## 2019-03-02 ENCOUNTER — Other Ambulatory Visit: Payer: Self-pay

## 2019-03-02 ENCOUNTER — Encounter (HOSPITAL_COMMUNITY)
Admission: RE | Admit: 2019-03-02 | Discharge: 2019-03-02 | Disposition: A | Discharge status: Home / Self Care | Payer: Medicare Other | Source: Ambulatory Visit | Attending: Obstetrics and Gynecology | Admitting: Obstetrics and Gynecology

## 2019-03-02 ENCOUNTER — Encounter (HOSPITAL_COMMUNITY): Payer: Self-pay

## 2019-03-02 DIAGNOSIS — Z20828 Contact with and (suspected) exposure to other viral communicable diseases: Secondary | ICD-10-CM | POA: Insufficient documentation

## 2019-03-02 DIAGNOSIS — N840 Polyp of corpus uteri: Secondary | ICD-10-CM | POA: Insufficient documentation

## 2019-03-02 DIAGNOSIS — Z01812 Encounter for preprocedural laboratory examination: Secondary | ICD-10-CM | POA: Insufficient documentation

## 2019-03-02 LAB — GLUCOSE, CAPILLARY: Glucose-Capillary: 120 mg/dL — ABNORMAL HIGH (ref 70–99)

## 2019-03-02 LAB — HEMOGLOBIN A1C
Hgb A1c MFr Bld: 6.6 % — ABNORMAL HIGH (ref 4.8–5.6)
Mean Plasma Glucose: 142.72 mg/dL

## 2019-03-02 LAB — CBC WITH DIFFERENTIAL/PLATELET
Abs Immature Granulocytes: 0.02 10*3/uL (ref 0.00–0.07)
Basophils Absolute: 0 10*3/uL (ref 0.0–0.1)
Basophils Relative: 1 %
Eosinophils Absolute: 0.2 10*3/uL (ref 0.0–0.5)
Eosinophils Relative: 2 %
HCT: 42.4 % (ref 36.0–46.0)
Hemoglobin: 13.4 g/dL (ref 12.0–15.0)
Immature Granulocytes: 0 %
Lymphocytes Relative: 34 %
Lymphs Abs: 2.7 10*3/uL (ref 0.7–4.0)
MCH: 29.5 pg (ref 26.0–34.0)
MCHC: 31.6 g/dL (ref 30.0–36.0)
MCV: 93.2 fL (ref 80.0–100.0)
Monocytes Absolute: 0.7 10*3/uL (ref 0.1–1.0)
Monocytes Relative: 9 %
Neutro Abs: 4.3 10*3/uL (ref 1.7–7.7)
Neutrophils Relative %: 54 %
Platelets: 301 10*3/uL (ref 150–400)
RBC: 4.55 MIL/uL (ref 3.87–5.11)
RDW: 13 % (ref 11.5–15.5)
WBC: 7.9 10*3/uL (ref 4.0–10.5)
nRBC: 0 % (ref 0.0–0.2)

## 2019-03-02 LAB — COMPREHENSIVE METABOLIC PANEL
ALT: 101 U/L — ABNORMAL HIGH (ref 0–44)
AST: 70 U/L — ABNORMAL HIGH (ref 15–41)
Albumin: 4.1 g/dL (ref 3.5–5.0)
Alkaline Phosphatase: 87 U/L (ref 38–126)
Anion gap: 9 (ref 5–15)
BUN: 8 mg/dL (ref 8–23)
CO2: 27 mmol/L (ref 22–32)
Calcium: 9.5 mg/dL (ref 8.9–10.3)
Chloride: 103 mmol/L (ref 98–111)
Creatinine, Ser: 0.71 mg/dL (ref 0.44–1.00)
GFR calc Af Amer: >60 mL/min (ref >60)
GFR calc non Af Amer: >60 mL/min (ref >60)
Glucose, Bld: 112 mg/dL — ABNORMAL HIGH (ref 70–99)
Potassium: 3.4 mmol/L — ABNORMAL LOW (ref 3.5–5.1)
Sodium: 139 mmol/L (ref 135–145)
Total Bilirubin: 1 mg/dL (ref 0.3–1.2)
Total Protein: 7.2 g/dL (ref 6.5–8.1)

## 2019-03-02 LAB — SARS CORONAVIRUS 2 (TAT 6-24 HRS): SARS Coronavirus 2: NEGATIVE

## 2019-03-05 ENCOUNTER — Telehealth: Payer: Self-pay | Admitting: General Practice

## 2019-03-05 NOTE — Telephone Encounter (Signed)
Negative COVID results given. Patient results "NOT Detected." Caller expressed understanding. ° °

## 2019-03-06 ENCOUNTER — Ambulatory Visit (HOSPITAL_COMMUNITY): Payer: Medicare Other | Admitting: Anesthesiology

## 2019-03-06 ENCOUNTER — Ambulatory Visit (HOSPITAL_COMMUNITY)
Admission: RE | Admit: 2019-03-06 | Discharge: 2019-03-06 | Disposition: A | Discharge status: Home / Self Care | Payer: Medicare Other | Attending: Obstetrics and Gynecology | Admitting: Obstetrics and Gynecology

## 2019-03-06 ENCOUNTER — Encounter (HOSPITAL_COMMUNITY)
Admission: RE | Disposition: A | Discharge status: Home / Self Care | Payer: Self-pay | Source: Home / Self Care | Attending: Obstetrics and Gynecology

## 2019-03-06 ENCOUNTER — Encounter (HOSPITAL_COMMUNITY): Payer: Self-pay | Admitting: *Deleted

## 2019-03-06 DIAGNOSIS — F419 Anxiety disorder, unspecified: Secondary | ICD-10-CM | POA: Diagnosis not present

## 2019-03-06 DIAGNOSIS — E785 Hyperlipidemia, unspecified: Secondary | ICD-10-CM | POA: Insufficient documentation

## 2019-03-06 DIAGNOSIS — N840 Polyp of corpus uteri: Secondary | ICD-10-CM | POA: Insufficient documentation

## 2019-03-06 DIAGNOSIS — Z87891 Personal history of nicotine dependence: Secondary | ICD-10-CM | POA: Diagnosis not present

## 2019-03-06 DIAGNOSIS — I714 Abdominal aortic aneurysm, without rupture: Secondary | ICD-10-CM | POA: Insufficient documentation

## 2019-03-06 DIAGNOSIS — I10 Essential (primary) hypertension: Secondary | ICD-10-CM | POA: Diagnosis not present

## 2019-03-06 DIAGNOSIS — E119 Type 2 diabetes mellitus without complications: Secondary | ICD-10-CM | POA: Diagnosis not present

## 2019-03-06 DIAGNOSIS — Z7982 Long term (current) use of aspirin: Secondary | ICD-10-CM | POA: Insufficient documentation

## 2019-03-06 DIAGNOSIS — Z79899 Other long term (current) drug therapy: Secondary | ICD-10-CM | POA: Diagnosis not present

## 2019-03-06 DIAGNOSIS — N950 Postmenopausal bleeding: Secondary | ICD-10-CM | POA: Insufficient documentation

## 2019-03-06 DIAGNOSIS — K219 Gastro-esophageal reflux disease without esophagitis: Secondary | ICD-10-CM | POA: Insufficient documentation

## 2019-03-06 DIAGNOSIS — R9389 Abnormal findings on diagnostic imaging of other specified body structures: Secondary | ICD-10-CM | POA: Diagnosis not present

## 2019-03-06 HISTORY — PX: POLYPECTOMY: SHX5525

## 2019-03-06 HISTORY — PX: HYSTEROSCOPY WITH D & C: SHX1775

## 2019-03-06 LAB — GLUCOSE, CAPILLARY: Glucose-Capillary: 127 mg/dL — ABNORMAL HIGH (ref 70–99)

## 2019-03-06 SURGERY — DILATATION AND CURETTAGE /HYSTEROSCOPY
Anesthesia: General | Site: Vagina

## 2019-03-06 MED ORDER — SODIUM CHLORIDE 0.9 % IR SOLN
Status: DC | PRN
  Administered 2019-03-06: 3000 mL

## 2019-03-06 MED ORDER — BUPIVACAINE-EPINEPHRINE (PF) 0.5% -1:200000 IJ SOLN
INTRAMUSCULAR | Status: AC
  Filled 2019-03-06: qty 30

## 2019-03-06 MED ORDER — LIDOCAINE 2% (20 MG/ML) 5 ML SYRINGE
INTRAMUSCULAR | Status: AC
  Filled 2019-03-06: qty 5

## 2019-03-06 MED ORDER — LIDOCAINE HCL (CARDIAC) PF 50 MG/5ML IV SOSY
PREFILLED_SYRINGE | INTRAVENOUS | Status: DC | PRN
  Administered 2019-03-06: 50 mg via INTRAVENOUS

## 2019-03-06 MED ORDER — LACTATED RINGERS IV SOLN
INTRAVENOUS | Status: DC | PRN
  Administered 2019-03-06: 08:00:00 via INTRAVENOUS

## 2019-03-06 MED ORDER — GLYCOPYRROLATE 0.2 MG/ML IJ SOLN
INTRAMUSCULAR | Status: DC | PRN
  Administered 2019-03-06: .2 mg via INTRAVENOUS

## 2019-03-06 MED ORDER — GLYCOPYRROLATE PF 0.2 MG/ML IJ SOSY
PREFILLED_SYRINGE | INTRAMUSCULAR | Status: AC
  Filled 2019-03-06: qty 1

## 2019-03-06 MED ORDER — PROPOFOL 10 MG/ML IV BOLUS
INTRAVENOUS | Status: DC | PRN
  Administered 2019-03-06: 160 mg via INTRAVENOUS

## 2019-03-06 MED ORDER — IBUPROFEN 600 MG PO TABS: 600.0000 mg | ORAL_TABLET | Freq: Four times a day (QID) | ORAL | 1 refills | Status: DC | PRN

## 2019-03-06 MED ORDER — LACTATED RINGERS IV SOLN
Freq: Once | INTRAVENOUS | Status: AC
  Administered 2019-03-06: 08:00:00 via INTRAVENOUS

## 2019-03-06 MED ORDER — EPHEDRINE SULFATE 50 MG/ML IJ SOLN
INTRAMUSCULAR | Status: DC | PRN
  Administered 2019-03-06: 10 mg via INTRAVENOUS

## 2019-03-06 MED ORDER — EPHEDRINE 5 MG/ML INJ
INTRAVENOUS | Status: AC
  Filled 2019-03-06: qty 10

## 2019-03-06 MED ORDER — MEPERIDINE HCL 50 MG/ML IJ SOLN: 6.2500 mg | INTRAMUSCULAR | Status: DC | PRN

## 2019-03-06 MED ORDER — HYDROMORPHONE HCL 1 MG/ML IJ SOLN
0.2500 mg | INTRAMUSCULAR | Status: DC | PRN
  Administered 2019-03-06: 0.5 mg via INTRAVENOUS
  Filled 2019-03-06: qty 0.5

## 2019-03-06 MED ORDER — CEFAZOLIN SODIUM-DEXTROSE 2-4 GM/100ML-% IV SOLN
2.0000 g | INTRAVENOUS | Status: AC
  Administered 2019-03-06: 2 g via INTRAVENOUS
  Filled 2019-03-06: qty 100

## 2019-03-06 MED ORDER — ONDANSETRON HCL 4 MG/2ML IJ SOLN: 4.0000 mg | Freq: Once | INTRAMUSCULAR | Status: DC | PRN

## 2019-03-06 MED ORDER — BUPIVACAINE-EPINEPHRINE 0.5% -1:200000 IJ SOLN
INTRAMUSCULAR | Status: DC | PRN
  Administered 2019-03-06: 18 mL

## 2019-03-06 MED ORDER — FENTANYL CITRATE (PF) 100 MCG/2ML IJ SOLN
INTRAMUSCULAR | Status: DC | PRN
  Administered 2019-03-06: 50 ug via INTRAVENOUS
  Administered 2019-03-06: 25 ug via INTRAVENOUS

## 2019-03-06 MED ORDER — FENTANYL CITRATE (PF) 250 MCG/5ML IJ SOLN
INTRAMUSCULAR | Status: AC
  Filled 2019-03-06: qty 5

## 2019-03-06 SURGICAL SUPPLY — 25 items
CLOTH BEACON ORANGE TIMEOUT ST (SAFETY) ×2 IMPLANT
COVER LIGHT HANDLE STERIS (MISCELLANEOUS) ×4 IMPLANT
COVER WAND RF STERILE (DRAPES) ×2 IMPLANT
DECANTER SPIKE VIAL GLASS SM (MISCELLANEOUS) ×2 IMPLANT
ELECT REM PT RETURN 9FT ADLT (ELECTROSURGICAL)
ELECTRODE REM PT RTRN 9FT ADLT (ELECTROSURGICAL) IMPLANT
GAUZE 4X4 16PLY RFD (DISPOSABLE) ×2 IMPLANT
GLOVE BIOGEL PI IND STRL 7.0 (GLOVE) ×2 IMPLANT
GLOVE BIOGEL PI IND STRL 9 (GLOVE) ×1 IMPLANT
GLOVE BIOGEL PI INDICATOR 7.0 (GLOVE) ×2
GLOVE BIOGEL PI INDICATOR 9 (GLOVE) ×1
GLOVE ECLIPSE 9.0 STRL (GLOVE) ×2 IMPLANT
GOWN SPEC L3 XXLG W/TWL (GOWN DISPOSABLE) ×2 IMPLANT
GOWN STRL REUS W/TWL LRG LVL3 (GOWN DISPOSABLE) ×2 IMPLANT
INST SET HYSTEROSCOPY (KITS) ×2 IMPLANT
IV NS IRRIG 3000ML ARTHROMATIC (IV SOLUTION) ×2 IMPLANT
KIT TURNOVER KIT A (KITS) ×2 IMPLANT
MANIFOLD NEPTUNE II (INSTRUMENTS) ×2 IMPLANT
NS IRRIG 1000ML POUR BTL (IV SOLUTION) ×2 IMPLANT
PACK PERI GYN (CUSTOM PROCEDURE TRAY) ×2 IMPLANT
PAD ARMBOARD 7.5X6 YLW CONV (MISCELLANEOUS) ×2 IMPLANT
PAD TELFA 3X4 1S STER (GAUZE/BANDAGES/DRESSINGS) ×2 IMPLANT
SET BASIN LINEN APH (SET/KITS/TRAYS/PACK) ×2 IMPLANT
SET CYSTO W/LG BORE CLAMP LF (SET/KITS/TRAYS/PACK) ×2 IMPLANT
SYR CONTROL 10ML LL (SYRINGE) ×2 IMPLANT

## 2019-03-06 NOTE — Anesthesia Postprocedure Evaluation (Signed)
Anesthesia Post Note  Patient: [name]  Procedure(s) Performed: DILATATION AND CURETTAGE /HYSTEROSCOPY (N/A Vagina ) POLYPECTOMY ( REMOVAL OF ENDO POLYP) (N/A Vagina )  Patient location during evaluation: Phase II Anesthesia Type: General Level of consciousness: awake and alert, oriented and patient cooperative Pain management: pain level controlled Vital Signs Assessment: post-procedure vital signs reviewed and stable Respiratory status: spontaneous breathing Cardiovascular status: stable Postop Assessment: no apparent nausea or vomiting Anesthetic complications: no     Last Vitals:  Vitals:   03/06/19 1015 03/06/19 1023  BP: (!) 114/50 (!) 125/54  Pulse: (!) 54 (!) 54  Resp: 15 16  Temp:  36.6 C  SpO2: 94% 96%    Last Pain:  Vitals:   03/06/19 1023  TempSrc: Oral  PainSc: 0-No pain                 ADAMS, AMY A

## 2019-03-06 NOTE — Discharge Instructions (Signed)
Dr. Glo Herring may be reached on his cell phone at [phone] over the next 3 days for any concerns  Hysteroscopy Hysteroscopy is a procedure that is used to examine the inside of a woman's womb (uterus). This may be done for various reasons, including:  To look for lumps (tumors) and other growths in the uterus.  To evaluate abnormal bleeding, fibroid tumors, polyps, scar tissue (adhesions), or cancer of the uterus.  To determine the cause of an inability to get pregnant (infertility) or repeated losses of pregnancies (miscarriages).  To find a lost IUD (intrauterine device).  To perform a procedure that permanently prevents pregnancy (sterilization). During this procedure, a thin, flexible tube with a small light and camera (hysteroscope) is used to examine the uterus. The camera sends images to a monitor in the room so that your health care provider can view the inside of your uterus. A hysteroscopy should be done right after a menstrual period to make sure that you are not pregnant. Tell a health care provider about:  Any allergies you have.  All medicines you are taking, including vitamins, herbs, eye drops, creams, and over-the-counter medicines.  Any problems you or family members have had with the use of anesthetic medicines.  Any blood disorders you have.  Any surgeries you have had.  Any medical conditions you have.  Whether you are pregnant or may be pregnant. What are the risks? Generally, this is a safe procedure. However, problems may occur, including:  Excessive bleeding.  Infection.  Damage to the uterus or other structures or organs.  Allergic reaction to medicines or fluids that are used in the procedure. What happens before the procedure? Staying hydrated Follow instructions from your health care provider about hydration, which may include:  Up to 2 hours before the procedure - you may continue to drink clear liquids, such as water, clear fruit juice,  black coffee, and plain tea. Eating and drinking restrictions Follow instructions from your health care provider about eating and drinking, which may include:  8 hours before the procedure - stop eating solid foods and drink clear liquids only  2 hours before the procedure - stop drinking clear liquids. General instructions  Ask your health care provider about: ? Changing or stopping your normal medicines. This is important if you take diabetes medicines or blood thinners. ? Taking medicines such as aspirin and ibuprofen. These medicines can thin your blood and cause bleeding. Do not take these medicines for 1 week before your procedure, or as told by your health care provider.  Do not use any products that contain nicotine or tobacco for 2 weeks before the procedure. This includes cigarettes and e-cigarettes. If you need help quitting, ask your health care provider.  Medicine may be placed in your cervix the day before the procedure. This medicine causes the cervix to have a larger opening (dilate). The larger opening makes it easier for the hysteroscope to be inserted into the uterus during the procedure.  Plan to have someone with you for the first 24-48 hours after the procedure, especially if you are given a medicine to make you fall asleep (general anesthetic).  Plan to have someone take you home from the hospital or clinic. What happens during the procedure?  To lower your risk of infection: ? Your health care team will wash or sanitize their hands. ? Your skin will be washed with soap. ? Hair may be removed from the surgical area.  An IV tube will be inserted into  one of your veins.  You may be given one or more of the following: ? A medicine to help you relax (sedative). ? A medicine that numbs the area around the cervix (local anesthetic). ? A medicine to make you fall asleep (general anesthetic).  A hysteroscope will be inserted through your vagina and into your  uterus.  Air or fluid will be used to enlarge your uterus, enabling your health care provider to see your uterus better. The amount of fluid used will be carefully checked throughout the procedure.  In some cases, tissue may be gently scraped from inside the uterus and sent to a lab for testing (biopsy). The procedure may vary among health care providers and hospitals. What happens after the procedure?  Your blood pressure, heart rate, breathing rate, and blood oxygen level will be monitored until the medicines you were given have worn off.  You may have some cramping. You may be given medicines for this.  You may have bleeding, which varies from light spotting to menstrual-like bleeding. This is normal.  If you had a biopsy done, it is your responsibility to get the results of your procedure. Ask your health care provider, or the department performing the procedure, when your results will be ready. Summary  Hysteroscopy is a procedure that is used to examine the inside of a woman's womb (uterus).  After the procedure, you may have bleeding, which varies from light spotting to menstrual-like bleeding. This is normal. You may also have cramping.  Plan to have someone take you home from the hospital or clinic. This information is not intended to replace advice given to you by your health care provider. Make sure you discuss any questions you have with your health care provider. Document Released: 08/02/2000 Document Revised: 04/08/2017 Document Reviewed: 05/25/2016 Elsevier Patient Education  2020 [employer].

## 2019-03-06 NOTE — Anesthesia Preprocedure Evaluation (Signed)
Anesthesia Evaluation  Patient identified by MRN, date of birth, ID band Patient awake    Reviewed: Allergy & Precautions, NPO status , Patient's Chart, lab work & pertinent test results, reviewed documented beta blocker date and time   History of Anesthesia Complications Negative for: history of anesthetic complications  Airway Mallampati: III  TM Distance: >3 FB Neck ROM: Full    Dental  (+) Upper Dentures, Partial Lower, Dental Advisory Given   Pulmonary former smoker,    Pulmonary exam normal breath sounds clear to auscultation       Cardiovascular hypertension, Pt. on medications and Pt. on home beta blockers Normal cardiovascular exam Rhythm:Regular Rate:Normal - Friction Rub    Neuro/Psych  Headaches, Anxiety    GI/Hepatic hiatal hernia, GERD  Medicated and Controlled,Fatty liver, high LFT's   Endo/Other  diabetes, Well Controlled, Type 2  Renal/GU      Musculoskeletal   Abdominal   Peds  Hematology negative hematology ROS (+)   Anesthesia Other Findings   Reproductive/Obstetrics                             Anesthesia Physical Anesthesia Plan  ASA: II  Anesthesia Plan: General   Post-op Pain Management:    Induction:   PONV Risk Score and Plan:   Airway Management Planned: LMA  Additional Equipment:   Intra-op Plan:   Post-operative Plan:   Informed Consent: I have reviewed the patients History and Physical, chart, labs and discussed the procedure including the risks, benefits and alternatives for the proposed anesthesia with the patient or authorized representative who has indicated his/her understanding and acceptance.     Dental advisory given  Plan Discussed with: CRNA  Anesthesia Plan Comments:         Anesthesia Quick Evaluation

## 2019-03-06 NOTE — Transfer of Care (Signed)
Immediate Anesthesia Transfer of Care Note  Patient: [name]  Procedure(s) Performed: DILATATION AND CURETTAGE /HYSTEROSCOPY (N/A Vagina ) POLYPECTOMY ( REMOVAL OF ENDO POLYP) (N/A Vagina )  Patient Location: PACU  Anesthesia Type:General  Level of Consciousness: awake  Airway & Oxygen Therapy: Patient Spontanous Breathing  Post-op Assessment: Report given to RN  Post vital signs: Reviewed and stable  Last Vitals:  Vitals Value Taken Time  BP 119/49 03/06/19 0941  Temp    Pulse 67 03/06/19 0945  Resp 15 03/06/19 0945  SpO2 94 % 03/06/19 0945  Vitals shown include unvalidated device data.  Last Pain:  Vitals:   03/06/19 0815  TempSrc: Oral  PainSc: 0-No pain      Patients Stated Pain Goal: 7 ([id number])  Complications: No apparent anesthesia complications

## 2019-03-06 NOTE — Brief Op Note (Signed)
03/06/2019  9:35 AM  PATIENT:  [name]  63 y.o. female  PRE-OPERATIVE DIAGNOSIS:  Endometrial thickening  Endo Polyp  POST-OPERATIVE DIAGNOSIS:  Endometrial thickening  Endo Polyp  PROCEDURE:  Procedure(s): DILATATION AND CURETTAGE /HYSTEROSCOPY (N/A) POLYPECTOMY ( REMOVAL OF ENDO POLYP) (N/A)  SURGEON:  Surgeon(s) and Role:    Jonnie Kind, MD - Primary  PHYSICIAN ASSISTANT:   ASSISTANTS: Zoila Shutter, CST  ANESTHESIA:   local, general and paracervical block  EBL:  10 mL   BLOOD ADMINISTERED:none  DRAINS: none   LOCAL MEDICATIONS USED:  MARCAINE    and Amount: 18  ml  SPECIMEN:  Source of Specimen:  Endometrial polyp, endometrial curettings  DISPOSITION OF SPECIMEN:  PATHOLOGY  COUNTS:  YES  TOURNIQUET:  * No tourniquets in log *  DICTATION: .Dragon Dictation  PLAN OF CARE: Admit to inpatient   PATIENT DISPOSITION:  PACU - hemodynamically stable.   Delay start of Pharmacological VTE agent (>24hrs) due to surgical blood loss or risk of bleeding: not applicable Details of procedure: Patient was taken the operating room prepped and draped for vaginal procedure, with timeout conducted Ancef administered and procedure confirmed by operative team. Speculum was inserted the cervix grasped with single-tooth tenaculum, paracervical block applied x18 cc at 3 5, 7 and 9:00.  Uterus was sounded in the anteflexed position to 8 cm, dilated to 23 [place] allowing introduction of the rigid 30 degree operative hysteroscope which identified tubal ostia and an intact endometrial cavity.  The majority of it was atrophic but there was a rather large polyp on the posterior aspects of the lower uterine segment which was then partially amputated off with hysteroscopic biopsy scissors, then #2 curette used to extract the remainder of the tissue.  Remained of sample was removed with Randall stone forceps.  Repeat hysteroscopy confirmed satisfactory endometrial cavity clearing.   Photos were taken throughout the case and will be included in the record. Sponge and needle counts were correct and patient to recovery room in stable condition

## 2019-03-06 NOTE — H&P (Signed)
Expand All Collapse All   Patient ID: [name], female   DOB: Apr 28, 1956, 63 y.o.   MRN: [id number]  Preoperative History and Physical  [name] is a 63 y.o. [id number] here for surgical management of postmenopausal bleeding since 2019.Marland Kitchen Ultrasound suggested a  thickened endometrium. 14 mm thickness. Sonohysterogram was done, and biopsy indicated an endometrial polyp. No cancer or precancerous changes noted. No longer having discharge or bleeding.  No significant preoperative concerns. Dr. Dennard Schaumann switched her BP medication due to her increase in BP  Proposed surgery: D&C w/ hysteroscopy removal of polyp      Past Medical History:  Diagnosis Date  . Abdominal aortic aneurysm (AAA) 3.0 cm to 5.0 cm in diameter in female [facility])    None seen on prior imaging?   Marland Kitchen Anxiety   . Cataract   . Chronic left shoulder pain   . Headache   . Hiatal hernia    small  . Hyperlipidemia   . Hypertension   . Pre-diabetes   . Prediabetes   . Schatzki's ring   . Tremor         Past Surgical History:  Procedure Laterality Date  . CARDIAC CATHETERIZATION  2005   "Ms. Medearis has essentially normal coronary arteries and normal left ventricular function.  She will be treated empirically with anti-reflux measures"  . CATARACT EXTRACTION, BILATERAL    . CHOLECYSTECTOMY    . COLONOSCOPY    06/16/2004   [id number] rectum/Diminutive polyps of the splenic flexure and the cecum/The remainder of the colonic mucosa appeared normal pathology (hyperplastic polyps).  . COLONOSCOPY N/A 08/07/2015   Dr. Gala Romney: 6 mm descending colon tubular adenoma, multiple medium-mouthed diverticula in sigmoid colon. surveillance 2022  . ESOPHAGOGASTRODUODENOSCOPY  09/11/2001   [id number] ring otherwise normal esophagus/Normal stomach/Normal D1 and D2 status post passage of a 56 [place] Maloney dilator  . ESOPHAGOGASTRODUODENOSCOPY N/A 11/11/2017   Procedure:  ESOPHAGOGASTRODUODENOSCOPY (EGD);  Surgeon: Daneil Dolin, MD;  [facility];  Service: Endoscopy;  Laterality: N/A;  8:15am  . ESOPHAGOGASTRODUODENOSCOPY (EGD) WITH ESOPHAGEAL DILATION N/A 01/25/2013   Dr. Gala Romney- schatzki's ring- 16 F dilation. small hiatal hernia  . MALONEY DILATION N/A 11/11/2017   Procedure: [name] DILATION;  Surgeon: Daneil Dolin, MD;  Location: [facility];  Service: Endoscopy;  Laterality: N/A;  . TUBAL LIGATION                     OB History  Gravida Para Term Preterm AB Living  4 3 3   1 3   SAB TAB Ectopic Multiple Live Births     1               # Outcome Date GA Lbr Len/2nd Weight Sex Delivery Anes PTL Lv  4 SAB           3 Term           2 Term           1 Term           Patient denies any other pertinent gynecologic issues.         Current Outpatient Medications on File Prior to Visit  Medication Sig Dispense Refill  . ALPRAZolam (XANAX) 0.5 MG tablet TAKE 1 TABLET(0.5 MG) BY MOUTH TWICE DAILY AS NEEDED FOR ANXIETY 60 tablet 0  . amLODipine (NORVASC) 10 MG tablet TAKE 1 TABLET(10 MG) BY MOUTH AT BEDTIME 90 tablet 3  . aspirin EC  81 MG tablet Take 81 mg by mouth at bedtime.     Marland Kitchen atorvastatin (LIPITOR) 80 MG tablet TAKE 1 TABLET BY MOUTH DAILY 90 tablet 3  . cloNIDine (CATAPRES) 0.1 MG tablet Take 1 tablet (0.1 mg total) by mouth 3 (three) times daily as needed (for high blood pressure >150). 90 tablet 3  . EPINEPHrine 0.3 mg/0.3 mL IJ SOAJ injection Inject 0.3 mg into the muscle as needed for anaphylaxis.     . fluticasone (FLONASE) 50 MCG/ACT nasal spray Place 2 sprays into both nostrils daily. 16 g 6  . hydrochlorothiazide (HYDRODIURIL) 25 MG tablet TAKE 1 TABLET(25 MG) BY MOUTH DAILY 90 tablet 2  . hydrOXYzine (ATARAX/VISTARIL) 25 MG tablet Take 0.5-1 tablets (12.5-25 mg total) by mouth every 8 (eight) hours as needed for itching. 20 tablet 0  . ipratropium (ATROVENT) 0.03 % nasal spray  USE 2 SPRAYS IN EACH NOSTRIL EVERY 12 HOURS 30 mL 12  . linaclotide (LINZESS) 145 MCG CAPS capsule Take 1 capsule (145 mcg total) by mouth daily before breakfast. (Patient taking differently: Take 145 mcg by mouth as needed. ) 30 capsule 3  . metoprolol succinate (TOPROL-XL) 100 MG 24 hr tablet Take 1 tablet (100 mg total) by mouth daily. Take with or immediately following a meal. 90 tablet 3  . mometasone (ELOCON) 0.1 % cream Apply 1 application topically daily as needed (for irritation).     . montelukast (SINGULAIR) 10 MG tablet TAKE 1 TABLET(10 MG) BY MOUTH AT BEDTIME 30 tablet 3  . pantoprazole (PROTONIX) 40 MG tablet Take 1 tablet (40 mg total) by mouth 2 (two) times daily. Take 30 minutes before meals 60 tablet 5  . potassium chloride SA (K-DUR) 20 MEQ tablet Take 1 tablet (20 mEq total) by mouth daily. 30 tablet 5  . tacrolimus (PROTOPIC) 0.1 % ointment Apply 1 application topically daily as needed (for irritation).     . triamcinolone cream (KENALOG) 0.1 % APPLY EXTERNALLY TO THE AFFECTED AREA TWICE DAILY (Patient taking differently: as needed. ) 45 g 0  . valsartan (DIOVAN) 320 MG tablet Take 1 tablet (320 mg total) by mouth daily. 30 tablet 3  . vitamin B-12 (CYANOCOBALAMIN) 1000 MCG tablet Take 1,000 mcg by mouth daily.    . vitamin C (ASCORBIC ACID) 500 MG tablet Take 1,000 mg by mouth daily.    Marland Kitchen VITAMIN E PO Take 1 tablet by mouth daily.      No current facility-administered medications on file prior to visit.        Allergies  Allergen Reactions  . Ciprofloxacin Shortness Of Breath    Social History:   reports that she quit smoking about 6 years ago. Her smoking use included cigarettes. She has a 8.75 pack-year smoking history. She has never used smokeless tobacco. She reports previous alcohol use. She reports that she does not use drugs.       Family History  Problem Relation Age of Onset  . Diabetes Other   . Diabetes Mother   . Heart disease Mother    . Heart disease Father   . Diabetes Sister   . Diabetes Brother   . Hypertension Brother   . Diabetes Daughter   . Hypertension Maternal Grandmother   . Stroke Maternal Grandfather   . Early death Paternal Grandfather   . Colon cancer Neg Hx   . Liver disease Neg Hx     Review of Systems: Noncontributory  PHYSICAL EXAM: There were no vitals taken for this  visit. General appearance - alert, well appearing, and in no distress Chest - clear to auscultation, no wheezes, rales or rhonchi, symmetric air entry Heart - normal rate and regular rhythm Abdomen - soft, nontender, nondistended, no masses or organomegaly Pelvic - Pelvic exam: VAGINA: no cystocele or rectocele, CERVIX: normal appearing cervix with good support. UTERUS: uterus is normal size, shape, consistency and nontender.  Extremities - peripheral pulses normal, no pedal edema, no clubbing or cyanosis  Labs: CBC    Component Value Date/Time   WBC 7.9 03/02/2019 1257   RBC 4.55 03/02/2019 1257   HGB 13.4 03/02/2019 1257   HGB 12.8 11/16/2012   HCT 42.4 03/02/2019 1257   HCT 38 11/16/2012   PLT 301 03/02/2019 1257   MCV 93.2 03/02/2019 1257   MCV 86.2 11/16/2012   MCH 29.5 03/02/2019 1257   MCHC 31.6 03/02/2019 1257   RDW 13.0 03/02/2019 1257   LYMPHSABS 2.7 03/02/2019 1257   MONOABS 0.7 03/02/2019 1257   EOSABS 0.2 03/02/2019 1257   BASOSABS 0.0 03/02/2019 1257   CMP     Component Value Date/Time   NA 139 03/02/2019 1257   NA 141 11/16/2012   K 3.4 (L) 03/02/2019 1257   K 4.1 11/16/2012   CL 103 03/02/2019 1257   CO2 27 03/02/2019 1257   GLUCOSE 112 (H) 03/02/2019 1257   BUN 8 03/02/2019 1257   BUN 11 11/16/2012   CREATININE 0.71 03/02/2019 1257   CREATININE 0.73 08/28/2018 1101   CALCIUM 9.5 03/02/2019 1257   CALCIUM 9.7 11/16/2012   PROT 7.2 03/02/2019 1257   ALBUMIN 4.1 03/02/2019 1257   ALBUMIN 4.4 11/16/2012   AST 70 (H) 03/02/2019 1257   AST 18 11/16/2012   ALT 101 (H)  03/02/2019 1257   ALKPHOS 87 03/02/2019 1257   ALKPHOS 77 11/16/2012   BILITOT 1.0 03/02/2019 1257   BILITOT 0.8 11/16/2012   GFRNONAA >60 03/02/2019 1257   GFRNONAA 87 08/28/2018 1101   GFRAA >60 03/02/2019 1257   GFRAA 101 08/28/2018 1101     Imaging Studies:  Imaging Results  Dg Chest 2 View  Result Date: 02/01/2019 CLINICAL DATA:  Hypertension EXAM: CHEST - 2 VIEW COMPARISON:  12/28/2017 FINDINGS: Heart is normal size. Aortic atherosclerosis. Lungs clear. No effusions. No acute bony abnormality. IMPRESSION: No active cardiopulmonary disease. Electronically Signed   By: Rolm Baptise M.D.   On: 02/01/2019 20:51   Ct Head Wo Contrast  Result Date: 02/01/2019 CLINICAL DATA:  Headache. Intermittent hypertension with systolic pressures in the 200s. EXAM: CT HEAD WITHOUT CONTRAST TECHNIQUE: Contiguous axial images were obtained from the base of the skull through the vertex without intravenous contrast. COMPARISON:  CT head dated December 29, 2017. FINDINGS: Brain: No evidence of acute infarction, hemorrhage, hydrocephalus, extra-axial collection or mass lesion/mass effect. Unchanged empty sella. Vascular: Atherosclerotic vascular calcification of the carotid siphons. No hyperdense vessel. Skull: Normal. Negative for fracture or focal lesion. Sinuses/Orbits: No acute finding. Other: None. IMPRESSION: 1.  No acute intracranial abnormality. Electronically Signed   By: Titus Dubin M.D.   On: 02/01/2019 20:38     Assessment:     Patient Active Problem List   Diagnosis Date Noted  . Nausea without vomiting 12/06/2018  . Loss of weight 12/06/2018  . Controlled diabetes mellitus type 2 with complications ([place]) [id number]  . Essential hypertension 04/25/2017  . Hyperlipidemia LDL goal <70 04/25/2017  . Carotid artery stenosis without cerebral infarction, bilateral 04/25/2017  . AAA (abdominal aortic aneurysm) without  rupture ([place]) 04/25/2017  . History of colonic polyps   .  Diverticulosis of colon without hemorrhage   . RUQ pain 02/21/2013  . Abdominal pain, epigastric 01/05/2013  . Abdominal bruit 01/05/2013  . Esophageal dysphagia 01/05/2013  . Constipation 01/05/2013  . GERD (gastroesophageal reflux disease) 01/05/2013    Plan: Patient will undergo surgical management with D&C hysteroscopy and polypectomy  On 06 Mar 2019.   By signing my name below, I, Samul Dada, attest that this documentation has been prepared under the direction and in the presence of Jonnie Kind, MD. Electronically Signed: [place]. 02/21/19. 10:34 AM.  I personally performed the services described in this documentation, which was SCRIBED in my presence. The recorded information has been reviewed and considered accurate. It has been edited as necessary during review. Jonnie Kind, MD

## 2019-03-06 NOTE — Op Note (Signed)
03/06/2019  9:35 AM  PATIENT:  [name]  63 y.o. female  PRE-OPERATIVE DIAGNOSIS:  Endometrial thickening  Endo Polyp  POST-OPERATIVE DIAGNOSIS:  Endometrial thickening  Endo Polyp  PROCEDURE:  Procedure(s): DILATATION AND CURETTAGE /HYSTEROSCOPY (N/A) POLYPECTOMY ( REMOVAL OF ENDO POLYP) (N/A)  SURGEON:  Surgeon(s) and Role:    Jonnie Kind, MD - Primary  PHYSICIAN ASSISTANT:   ASSISTANTS: Zoila Shutter, CST  ANESTHESIA:   local, general and paracervical block  EBL:  10 mL   BLOOD ADMINISTERED:none  DRAINS: none   LOCAL MEDICATIONS USED:  MARCAINE    and Amount: 18  ml  SPECIMEN:  Source of Specimen:  Endometrial polyp, endometrial curettings  DISPOSITION OF SPECIMEN:  PATHOLOGY  COUNTS:  YES  TOURNIQUET:  * No tourniquets in log *  DICTATION: .Dragon Dictation  PLAN OF CARE: Admit to inpatient   PATIENT DISPOSITION:  PACU - hemodynamically stable.   Delay start of Pharmacological VTE agent (>24hrs) due to surgical blood loss or risk of bleeding: not applicable Details of procedure: Patient was taken the operating room prepped and draped for vaginal procedure, with timeout conducted Ancef administered and procedure confirmed by operative team. Speculum was inserted the cervix grasped with single-tooth tenaculum, paracervical block applied x18 cc at 3 5, 7 and 9:00.  Uterus was sounded in the anteflexed position to 8 cm, dilated to 23 [place] allowing introduction of the rigid 30 degree operative hysteroscope which identified tubal ostia and an intact endometrial cavity.  The majority of it was atrophic but there was a rather large polyp on the posterior aspects of the lower uterine segment which was then partially amputated off with hysteroscopic biopsy scissors, then #2 curette used to extract the remainder of the tissue.  Remained of sample was removed with Randall stone forceps.  Repeat hysteroscopy confirmed satisfactory endometrial cavity clearing.   Photos were taken throughout the case and will be included in the record. Sponge and needle counts were correct and patient to recovery room in stable condition

## 2019-03-06 NOTE — Anesthesia Procedure Notes (Signed)
Procedure Name: LMA Insertion Date/Time: 03/06/2019 9:05 AM Performed by: Ollen Bowl, CRNA Pre-anesthesia Checklist: Patient identified, Patient being monitored, Emergency Drugs available, Timeout performed and Suction available Patient Re-evaluated:Patient Re-evaluated prior to induction Oxygen Delivery Method: Circle System Utilized Preoxygenation: Pre-oxygenation with 100% oxygen Induction Type: IV induction Ventilation: Mask ventilation without difficulty LMA: LMA inserted LMA Size: 4.0 Number of attempts: 1 Placement Confirmation: positive ETCO2 and breath sounds checked- equal and bilateral

## 2019-03-07 ENCOUNTER — Encounter (HOSPITAL_COMMUNITY): Payer: Self-pay | Admitting: Obstetrics and Gynecology

## 2019-03-07 ENCOUNTER — Telehealth: Payer: Self-pay | Admitting: Obstetrics and Gynecology

## 2019-03-07 LAB — SURGICAL PATHOLOGY

## 2019-03-07 NOTE — Telephone Encounter (Signed)
Benign tissue report on polyp.

## 2019-03-08 ENCOUNTER — Encounter (HOSPITAL_COMMUNITY): Payer: Medicare Other

## 2019-03-08 ENCOUNTER — Ambulatory Visit (HOSPITAL_COMMUNITY): Payer: Medicare Other

## 2019-03-08 ENCOUNTER — Other Ambulatory Visit (HOSPITAL_COMMUNITY): Payer: Medicare Other

## 2019-03-14 ENCOUNTER — Other Ambulatory Visit: Payer: Self-pay | Admitting: Family Medicine

## 2019-03-14 ENCOUNTER — Other Ambulatory Visit: Payer: Self-pay

## 2019-03-15 ENCOUNTER — Other Ambulatory Visit: Payer: Self-pay | Admitting: *Deleted

## 2019-03-15 ENCOUNTER — Ambulatory Visit (INDEPENDENT_AMBULATORY_CARE_PROVIDER_SITE_OTHER): Payer: Medicare Other | Admitting: Family Medicine

## 2019-03-15 ENCOUNTER — Encounter: Payer: Self-pay | Admitting: Family Medicine

## 2019-03-15 VITALS — BP 160/70 | HR 68 | Temp 97.4°F | Resp 18 | Ht 67.0 in | Wt 176.0 lb

## 2019-03-15 DIAGNOSIS — R7989 Other specified abnormal findings of blood chemistry: Secondary | ICD-10-CM | POA: Diagnosis not present

## 2019-03-15 DIAGNOSIS — I10 Essential (primary) hypertension: Secondary | ICD-10-CM

## 2019-03-15 DIAGNOSIS — E118 Type 2 diabetes mellitus with unspecified complications: Secondary | ICD-10-CM | POA: Diagnosis not present

## 2019-03-15 MED ORDER — HYDROCHLOROTHIAZIDE 25 MG PO TABS: ORAL_TABLET | ORAL | 2 refills | Status: DC

## 2019-03-15 NOTE — Progress Notes (Signed)
Subjective:    Patient ID: [name], female    DOB: May 11, 1955, 63 y.o.   MRN: [id number]  HPI  Patient was seen in the emergency room at the end of September.  During that visit a CMP was ordered and her liver function tests were completely normal in the 30s.  Recently she underwent surgery under the care of her gynecologist.  Lab work obtained during the surgery showed elevated liver function test with an AST and ALT in the 70s and 100 level.  Patient denies any recent virus.  She denies any recent food poisoning.  She denies any diarrhea or jaundice.  She denies any exposure to viral hepatitis.  She denies any recent flulike illness.  She is not drinking any alcohol.  She does not take any Tylenol.  She has not been taking any ibuprofen.  However she is on Lipitor 80 mg a day.  Her hemoglobin A1c was also found to be elevated at 6.6.  This is been steadily increasing over the last few years.  She is currently managed with diet Past Medical History:  Diagnosis Date  . Abdominal aortic aneurysm (AAA) 3.0 cm to 5.0 cm in diameter in female [facility])    None seen on prior imaging?   Marland Kitchen Anxiety   . Cataract   . Chronic left shoulder pain   . Headache   . Hiatal hernia    small  . Hyperlipidemia   . Hypertension   . Pre-diabetes   . Prediabetes   . Schatzki's ring   . Tremor    Past Surgical History:  Procedure Laterality Date  . CARDIAC CATHETERIZATION  2005   "Ms. Piper has essentially normal coronary arteries and normal left ventricular function.  She will be treated empirically with anti-reflux measures"  . CATARACT EXTRACTION, BILATERAL    . CHOLECYSTECTOMY    . COLONOSCOPY    06/16/2004   [id number] rectum/Diminutive polyps of the splenic flexure and the cecum/The remainder of the colonic mucosa appeared normal pathology (hyperplastic polyps).  . COLONOSCOPY N/A 08/07/2015   Dr. Gala Romney: 6 mm descending colon tubular adenoma, multiple medium-mouthed diverticula in sigmoid  colon. surveillance 2022  . ESOPHAGOGASTRODUODENOSCOPY  09/11/2001   [id number] ring otherwise normal esophagus/Normal stomach/Normal D1 and D2 status post passage of a 56 [place] Maloney dilator  . ESOPHAGOGASTRODUODENOSCOPY N/A 11/11/2017   Procedure: ESOPHAGOGASTRODUODENOSCOPY (EGD);  Surgeon: Daneil Dolin, MD;  [facility];  Service: Endoscopy;  Laterality: N/A;  8:15am  . ESOPHAGOGASTRODUODENOSCOPY (EGD) WITH ESOPHAGEAL DILATION N/A 01/25/2013   Dr. Gala Romney- schatzki's ring- 33 F dilation. small hiatal hernia  . HYSTEROSCOPY W/D&C N/A 03/06/2019   Procedure: DILATATION AND CURETTAGE /HYSTEROSCOPY;  Surgeon: Jonnie Kind, MD;  Location: [facility];  Service: Gynecology;  Laterality: N/A;  . [name] DILATION N/A 11/11/2017   Procedure: [name] DILATION;  Surgeon: Daneil Dolin, MD;  Location: [facility];  Service: Endoscopy;  Laterality: N/A;  . POLYPECTOMY N/A 03/06/2019   Procedure: POLYPECTOMY ( REMOVAL OF ENDOMETRIAL POLYP);  Surgeon: Jonnie Kind, MD;  Location: [facility];  Service: Gynecology;  Laterality: N/A;  . TUBAL LIGATION     Current Outpatient Medications on File Prior to Visit  Medication Sig Dispense Refill  . acetaminophen (TYLENOL) 500 MG tablet Take 500 mg by mouth every 6 (six) hours as needed for moderate pain or headache.    . ALPRAZolam (XANAX) 0.5 MG tablet TAKE 1 TABLET(0.5 MG) BY MOUTH TWICE DAILY AS NEEDED FOR ANXIETY (  Patient taking differently: Take 0.5 mg by mouth 2 (two) times daily as needed for anxiety. ) 60 tablet 0  . amLODipine (NORVASC) 10 MG tablet TAKE 1 TABLET(10 MG) BY MOUTH AT BEDTIME (Patient taking differently: Take 10 mg by mouth at bedtime. ) 90 tablet 3  . aspirin EC 81 MG tablet Take 81 mg by mouth at bedtime.     Marland Kitchen atorvastatin (LIPITOR) 80 MG tablet TAKE 1 TABLET BY MOUTH DAILY (Patient taking differently: Take 80 mg by mouth at bedtime. ) 90 tablet 3  . cholecalciferol (VITAMIN D3) 25 MCG (1000 UT) tablet Take 2,000 Units by  mouth daily.    . cloNIDine (CATAPRES) 0.1 MG tablet Take 1 tablet (0.1 mg total) by mouth 3 (three) times daily as needed (for high blood pressure >150). 90 tablet 3  . Cyanocobalamin (B-12) 2500 MCG TABS Take 2,500 mcg by mouth daily.    Marland Kitchen EPINEPHrine 0.3 mg/0.3 mL IJ SOAJ injection Inject 0.3 mg into the muscle as needed for anaphylaxis.     . fluticasone (FLONASE) 50 MCG/ACT nasal spray Place 2 sprays into both nostrils daily. 16 g 6  . hydrOXYzine (ATARAX/VISTARIL) 25 MG tablet Take 0.5-1 tablets (12.5-25 mg total) by mouth every 8 (eight) hours as needed for itching. 20 tablet 0  . ibuprofen (ADVIL) 600 MG tablet Take 1 tablet (600 mg total) by mouth every 6 (six) hours as needed. 30 tablet 1  . ipratropium (ATROVENT) 0.03 % nasal spray USE 2 SPRAYS IN EACH NOSTRIL EVERY 12 HOURS (Patient taking differently: Place 2 sprays into both nostrils See admin instructions. Instill 2 sprays into each nostril each morning ,may do a second dose later as needed for allergies) 30 mL 12  . LINZESS 145 MCG CAPS capsule TAKE 1 CAPSULE(145 MCG) BY MOUTH DAILY BEFORE BREAKFAST 30 capsule 3  . MAGNESIUM PO Take 330 mg by mouth daily.    . metoprolol succinate (TOPROL-XL) 100 MG 24 hr tablet Take 1 tablet (100 mg total) by mouth daily. Take with or immediately following a meal. (Patient taking differently: Take 100 mg by mouth every evening. Take with or immediately following a meal.) 90 tablet 3  . mometasone (ELOCON) 0.1 % cream Apply 1 application topically daily as needed (for irritation).     . montelukast (SINGULAIR) 10 MG tablet TAKE 1 TABLET(10 MG) BY MOUTH AT BEDTIME (Patient taking differently: Take 10 mg by mouth daily as needed (allergies). ) 30 tablet 3  . Omega-3 Fatty Acids (FISH OIL) 1000 MG CAPS Take 1,000 mg by mouth daily.    . pantoprazole (PROTONIX) 40 MG tablet Take 1 tablet (40 mg total) by mouth 2 (two) times daily. Take 30 minutes before meals 60 tablet 5  . potassium chloride SA (K-DUR) 20  MEQ tablet Take 1 tablet (20 mEq total) by mouth daily. 30 tablet 5  . sucralfate (CARAFATE) 1 g tablet TAKE 1 TABLET(1 GRAM) BY MOUTH FOUR TIMES DAILY AT BEDTIME WITH MEALS 360 tablet 0  . tacrolimus (PROTOPIC) 0.1 % ointment Apply 1 application topically daily as needed (for irritation).     . triamcinolone cream (KENALOG) 0.1 % APPLY EXTERNALLY TO THE AFFECTED AREA TWICE DAILY 45 g 0  . valsartan (DIOVAN) 320 MG tablet Take 1 tablet (320 mg total) by mouth daily. 30 tablet 3  . vitamin C (ASCORBIC ACID) 250 MG tablet Take 500 mg by mouth daily.    . vitamin E 400 UNIT capsule Take 400 Units by mouth daily.  No current facility-administered medications on file prior to visit.    Allergies  Allergen Reactions  . Ciprofloxacin Shortness Of Breath   Social History   Socioeconomic History  . Marital status: Single    Spouse name: Not on file  . Number of children: 3  . Years of education: Not on file  . Highest education level: Not on file  Occupational History  . 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 Financial resource strain: Not on file  . Food insecurity    Worry: Not on file    Inability: Not on file  . Transportation needs    Medical: Not on file    Non-medical: Not on file  Tobacco Use  . Smoking status: Former Smoker    Packs/day: 0.25    Years: 35.00    Pack years: 8.75    Types: Cigarettes    Quit date: 12/31/2012    Years since quitting: 6.2  . Smokeless tobacco: Never Used  Substance and Sexual Activity  . Alcohol use: Not Currently    Comment: occasionaly  . Drug use: No  . Sexual activity: Not Currently    Partners: Male    Birth control/protection: Post-menopausal, Surgical    Comment: tubal  Lifestyle  . Physical activity    Days per week: Not on file    Minutes per session: Not on file  . Stress: Not on file  Relationships  . Social Herbalist on phone: Not on file    Gets together: Not on file     Attends religious service: Not on file    Active member of club or organization: Not on file    Attends meetings of clubs or organizations: Not on file    Relationship status: Not on file  . Intimate partner violence    Fear of current or ex partner: Not on file    Emotionally abused: Not on file    Physically abused: Not on file    Forced sexual activity: Not on file  Other Topics Concern  . Not on file  Social History Narrative   Eats all food groups.    Wears seatbelt.    Has 3 children.   Prior smoker.   Attends church.    Used to work in [employer] fund[employer].    Drives.   Enjoys going out with family.    Family History  Problem Relation Age of Onset  . Diabetes Other   . Diabetes Mother   . Heart disease Mother   . Heart disease Father   . Diabetes Sister   . Diabetes Brother   . Hypertension Brother   . Diabetes Daughter   . Hypertension Maternal Grandmother   . Stroke Maternal Grandfather   . Early death Paternal Grandfather   . Colon cancer Neg Hx   . Liver disease Neg Hx       Review of Systems  All other systems reviewed and are negative.      Objective:   Physical Exam Vitals signs reviewed.  Constitutional:      Appearance: She is well-developed.  Cardiovascular:     Rate and Rhythm: Normal rate and regular rhythm.     Heart sounds: Normal heart sounds. No murmur. No friction rub. No gallop.   Pulmonary:     Effort: Pulmonary effort is normal. No respiratory distress.     Breath sounds: Normal breath sounds. No stridor. No wheezing or rales.  Chest:  Chest wall: No tenderness.  Neurological:     Motor: No abnormal muscle tone.           Assessment & Plan:  Controlled diabetes mellitus type 2 with complications, unspecified whether long term insulin use (HCC)  Elevated LFTs - Plan: COMPLETE METABOLIC PANEL WITH GFR  I believe her liver function tests are likely due to the Lipitor.  I recommended holding the Lipitor and rechecking liver  function test in a week.  I will continue to check liver function test until they return to normal.  If they are not improving at all in a week I would recommend a right upper quadrant ultrasound as well as an ANA, sedimentation rate, and antiliver kidney microsomal antibody test, viral hepatitis panel.  We also discussed a low carbohydrate diet today, 30 minutes of aerobic exercise daily, and 10 to 15 pounds of weight loss to help address her borderline type 2 diabetes mellitus.

## 2019-03-19 ENCOUNTER — Encounter: Payer: Self-pay | Admitting: Obstetrics and Gynecology

## 2019-03-19 ENCOUNTER — Other Ambulatory Visit: Payer: Self-pay

## 2019-03-19 ENCOUNTER — Ambulatory Visit (INDEPENDENT_AMBULATORY_CARE_PROVIDER_SITE_OTHER): Payer: Medicare Other | Admitting: Obstetrics and Gynecology

## 2019-03-19 VITALS — BP 180/79 | HR 63 | Ht 67.0 in | Wt 177.6 lb

## 2019-03-19 DIAGNOSIS — Z09 Encounter for follow-up examination after completed treatment for conditions other than malignant neoplasm: Secondary | ICD-10-CM

## 2019-03-19 DIAGNOSIS — Z9889 Other specified postprocedural states: Secondary | ICD-10-CM | POA: Diagnosis not present

## 2019-03-19 NOTE — Progress Notes (Signed)
   Subjective:  [name] is a 63 y.o. female now 2 weeks status post operative hysteroscopy.   At the time of hysteroscopy we removed the polyp which was benign patient has received a report.  She has no concerns.  She has had minimal light spotting since the procedure. Review of Systems Negative except negative   Diet:   Regular normal   Bowel movements : normal.  The patient is not having any pain.  Objective:  BP (!) 180/79 (BP Location: Right Arm, Patient Position: Sitting, Cuff Size: Normal)   Pulse 63   Ht 5\' 7"  (1.702 m)   Wt 177 lb 9.6 oz (80.6 kg)   BMI 27.82 kg/m  General:Well developed, well nourished.  No acute distress. Abdomen: Bowel sounds normal, soft, non-tender. Pelvic Exam:    Pelvic exam declined has not needed   adnexa/Bimanual: Normal  Incision(s): N/A       Assessment:  Post-Op 2 weeks s/p operative hysteroscopy   Patient doing excellently postoperatively.   Plan:  1.Wound care discussed patient will let us know if she is bleeding in 6 months currently has only tiny light spotting 2. . current medications.  See HPI 3. Activity restrictions: none 4. return to work: not applicable. 5. Follow up in As needed .

## 2019-03-19 NOTE — Patient Instructions (Signed)
Thank you for letting us take care of you let us know in 6 months if you continue to have off-and-on light spotting

## 2019-03-26 ENCOUNTER — Other Ambulatory Visit: Payer: Self-pay

## 2019-03-26 ENCOUNTER — Other Ambulatory Visit: Payer: Medicare Other

## 2019-03-26 DIAGNOSIS — R7989 Other specified abnormal findings of blood chemistry: Secondary | ICD-10-CM | POA: Diagnosis not present

## 2019-03-27 ENCOUNTER — Other Ambulatory Visit: Payer: Self-pay

## 2019-03-27 ENCOUNTER — Other Ambulatory Visit: Payer: Self-pay | Admitting: Family Medicine

## 2019-03-27 DIAGNOSIS — R7989 Other specified abnormal findings of blood chemistry: Secondary | ICD-10-CM

## 2019-03-27 LAB — COMPLETE METABOLIC PANEL WITH GFR
AG Ratio: 1.6 (calc) (ref 1.0–2.5)
ALT: 63 U/L — ABNORMAL HIGH (ref 6–29)
AST: 51 U/L — ABNORMAL HIGH (ref 10–35)
Albumin: 4.1 g/dL (ref 3.6–5.1)
Alkaline phosphatase (APISO): 81 U/L (ref 37–153)
BUN: 10 mg/dL (ref 7–25)
CO2: 28 mmol/L (ref 20–32)
Calcium: 9.6 mg/dL (ref 8.6–10.4)
Chloride: 102 mmol/L (ref 98–110)
Creat: 0.94 mg/dL (ref 0.50–0.99)
GFR, Est African American: 74 mL/min/{1.73_m2} (ref >=60)
GFR, Est Non African American: 64 mL/min/{1.73_m2} (ref >=60)
Globulin: 2.6 g/dL (calc) (ref 1.9–3.7)
Glucose, Bld: 102 mg/dL — ABNORMAL HIGH (ref 65–99)
Potassium: 3.9 mmol/L (ref 3.5–5.3)
Sodium: 139 mmol/L (ref 135–146)
Total Bilirubin: 0.8 mg/dL (ref 0.2–1.2)
Total Protein: 6.7 g/dL (ref 6.1–8.1)

## 2019-03-28 ENCOUNTER — Ambulatory Visit (HOSPITAL_COMMUNITY)
Admission: RE | Admit: 2019-03-28 | Discharge: 2019-03-28 | Disposition: A | Discharge status: Home / Self Care | Payer: Medicare Other | Source: Ambulatory Visit | Attending: Cardiovascular Disease | Admitting: Cardiovascular Disease

## 2019-03-28 ENCOUNTER — Ambulatory Visit (HOSPITAL_COMMUNITY): Payer: Medicare Other

## 2019-03-28 ENCOUNTER — Other Ambulatory Visit: Payer: Self-pay

## 2019-03-28 ENCOUNTER — Encounter (HOSPITAL_COMMUNITY)
Admission: RE | Admit: 2019-03-28 | Discharge: 2019-03-28 | Disposition: A | Discharge status: Home / Self Care | Payer: Medicare Other | Source: Ambulatory Visit | Attending: Cardiovascular Disease | Admitting: Cardiovascular Disease

## 2019-03-28 ENCOUNTER — Telehealth: Payer: Self-pay

## 2019-03-28 DIAGNOSIS — R0600 Dyspnea, unspecified: Secondary | ICD-10-CM

## 2019-03-28 DIAGNOSIS — I10 Essential (primary) hypertension: Secondary | ICD-10-CM | POA: Diagnosis not present

## 2019-03-28 DIAGNOSIS — R079 Chest pain, unspecified: Secondary | ICD-10-CM | POA: Diagnosis not present

## 2019-03-28 LAB — NM MYOCAR MULTI W/SPECT W/WALL MOTION / EF
LV dias vol: 78 mL (ref 46–106)
LV sys vol: 30 mL
Peak HR: 95 {beats}/min
RATE: 0.29
Rest HR: 57 {beats}/min
SDS: 1
SRS: 0
SSS: 1
TID: 1.05

## 2019-03-28 MED ORDER — REGADENOSON 0.4 MG/5ML IV SOLN
INTRAVENOUS | Status: AC
  Administered 2019-03-28: 0.4 mg via INTRAVENOUS
  Filled 2019-03-28: qty 5

## 2019-03-28 MED ORDER — TECHNETIUM TC 99M TETROFOSMIN IV KIT
30.0000 | PACK | Freq: Once | INTRAVENOUS | Status: AC | PRN
  Administered 2019-03-28: 30.8 via INTRAVENOUS

## 2019-03-28 MED ORDER — TECHNETIUM TC 99M TETROFOSMIN IV KIT
10.0000 | PACK | Freq: Once | INTRAVENOUS | Status: AC | PRN
  Administered 2019-03-28: 10.29 via INTRAVENOUS

## 2019-03-28 MED ORDER — SODIUM CHLORIDE FLUSH 0.9 % IV SOLN
INTRAVENOUS | Status: AC
  Administered 2019-03-28: 10 mL via INTRAVENOUS
  Filled 2019-03-28: qty 10

## 2019-03-28 NOTE — Progress Notes (Signed)
*  PRELIMINARY RESULTS* Echocardiogram 2D Echocardiogram has been performed.  Samuel Germany 03/28/2019, 9:04 AM

## 2019-03-28 NOTE — Telephone Encounter (Signed)
Called pt., no answer. Left message for pt to return call.  

## 2019-04-10 ENCOUNTER — Other Ambulatory Visit: Payer: Medicare Other

## 2019-04-10 ENCOUNTER — Other Ambulatory Visit: Payer: Self-pay

## 2019-04-10 DIAGNOSIS — R7989 Other specified abnormal findings of blood chemistry: Secondary | ICD-10-CM | POA: Diagnosis not present

## 2019-04-10 DIAGNOSIS — H524 Presbyopia: Secondary | ICD-10-CM | POA: Diagnosis not present

## 2019-04-11 LAB — HEPATIC FUNCTION PANEL
AG Ratio: 1.5 (calc) (ref 1.0–2.5)
ALT: 29 U/L (ref 6–29)
AST: 25 U/L (ref 10–35)
Albumin: 3.8 g/dL (ref 3.6–5.1)
Alkaline phosphatase (APISO): 74 U/L (ref 37–153)
Bilirubin, Direct: 0.2 mg/dL (ref 0.0–0.2)
Globulin: 2.6 g/dL (calc) (ref 1.9–3.7)
Indirect Bilirubin: 0.8 mg/dL (calc) (ref 0.2–1.2)
Total Bilirubin: 1 mg/dL (ref 0.2–1.2)
Total Protein: 6.4 g/dL (ref 6.1–8.1)

## 2019-04-12 ENCOUNTER — Other Ambulatory Visit: Payer: Self-pay

## 2019-04-12 MED ORDER — PRAVASTATIN SODIUM 20 MG PO TABS: 20.0000 mg | ORAL_TABLET | Freq: Every day | ORAL | 0 refills | Status: DC

## 2019-04-16 DIAGNOSIS — J301 Allergic rhinitis due to pollen: Secondary | ICD-10-CM | POA: Diagnosis not present

## 2019-04-16 DIAGNOSIS — L509 Urticaria, unspecified: Secondary | ICD-10-CM | POA: Diagnosis not present

## 2019-04-16 DIAGNOSIS — T783XXD Angioneurotic edema, subsequent encounter: Secondary | ICD-10-CM | POA: Diagnosis not present

## 2019-04-16 DIAGNOSIS — J3089 Other allergic rhinitis: Secondary | ICD-10-CM | POA: Diagnosis not present

## 2019-04-17 ENCOUNTER — Other Ambulatory Visit: Payer: Self-pay | Admitting: Family Medicine

## 2019-04-17 DIAGNOSIS — J302 Other seasonal allergic rhinitis: Secondary | ICD-10-CM

## 2019-04-17 MED ORDER — FLUTICASONE PROPIONATE 50 MCG/ACT NA SUSP: 2.0000 | Freq: Every day | NASAL | 6 refills | Status: DC

## 2019-04-24 ENCOUNTER — Other Ambulatory Visit: Payer: Self-pay | Admitting: *Deleted

## 2019-04-24 DIAGNOSIS — I10 Essential (primary) hypertension: Secondary | ICD-10-CM

## 2019-04-24 MED ORDER — METOPROLOL SUCCINATE ER 100 MG PO TB24: 100.0000 mg | ORAL_TABLET | Freq: Every evening | ORAL | 3 refills | Status: DC

## 2019-04-30 ENCOUNTER — Other Ambulatory Visit: Payer: Self-pay | Admitting: Family Medicine

## 2019-05-10 ENCOUNTER — Other Ambulatory Visit: Payer: Self-pay | Admitting: Family Medicine

## 2019-05-15 NOTE — Progress Notes (Signed)
Referring Provider: Susy Frizzle, MD Primary Care Physician:  Susy Frizzle, MD Primary GI Physician: Dr. Gala Romney  Chief Complaint  Patient presents with  . Abdominal Pain    last episode 3-4 weeks ago  . Gastroesophageal Reflux    doing okay    HPI:   [name] is a 64 y.o. female with history of constipation and GERD. EGD on 11/11/2017 for dysphasia with mild Schatzki ring s/p dilation and mild erosive gastropathy.  Colonoscopy up-to-date in 2017.  Due for repeat in 2022. She presents today for follow-up. Last seen on 02/12/19. Epigastric burning had resolved. Reflux fairly well controlled with breakthrough about once a week. Occasional sensation of meats hanging at sternal notch for a couple seconds. Was not interested in repeat EGD as symptoms were rare. Constipation well controlled on Linzess. It was also noted that patient had lost about 8 lbs earlier in the year. She reported long history of early satiety, but also reported significant change in her diet in light of fatty liver and diabetes.  At the time of her office visit, she had actually gained 2 pounds and her appetite was improved. Plans to continue Protonix BID, tums or pepcid as needed, Linzess 145 daily, follow-up in 3 months  Today:  GERD: Well controlled on Protonix BID. Rare breakthrough symptoms. Last episode 3 weeks.  Thinks she ate something with some spices on it. Avoiding fried, fatty, and greasy foods. Occasional soda. No nausea or vomiting. Denies abdominal pain. Denies abdominal pain 3 weeks ago when GERD flared up. Hasn't needed tums or pepcid. Appetite is good. No unintentional weight loss.   Occasionally feels like it is hard to swallow. Last episode about 3 weeks ago. Occurred with bread. Hasn't happened since. Can't remember when it happened before this. States symptom do not occur frequently. Less than once a month. Not interested in EGD at this time. Wants to continue to monitor.    Constipation: Well controlled on Linzess. Having soft and formed BM daily. No blood in the stool or black stools.   No NSAIDs.   Past Medical History:  Diagnosis Date  . Abdominal aortic aneurysm (AAA) 3.0 cm to 5.0 cm in diameter in female [facility])    None seen on prior imaging?   Marland Kitchen Anxiety   . Cataract   . Chronic left shoulder pain   . Headache   . Hiatal hernia    small  . Hyperlipidemia   . Hypertension   . Pre-diabetes   . Prediabetes   . Schatzki's ring   . Tremor     Past Surgical History:  Procedure Laterality Date  . CARDIAC CATHETERIZATION  2005   "Ms. Butrick has essentially normal coronary arteries and normal left ventricular function.  She will be treated empirically with anti-reflux measures"  . CATARACT EXTRACTION, BILATERAL    . CHOLECYSTECTOMY    . COLONOSCOPY    06/16/2004   [id number] rectum/Diminutive polyps of the splenic flexure and the cecum/The remainder of the colonic mucosa appeared normal pathology (hyperplastic polyps).  . COLONOSCOPY N/A 08/07/2015   Dr. Gala Romney: 6 mm descending colon tubular adenoma, multiple medium-mouthed diverticula in sigmoid colon. surveillance 2022  . ESOPHAGOGASTRODUODENOSCOPY  09/11/2001   [id number] ring otherwise normal esophagus/Normal stomach/Normal D1 and D2 status post passage of a 56 [place] Maloney dilator  . ESOPHAGOGASTRODUODENOSCOPY N/A 11/11/2017   Procedure: ESOPHAGOGASTRODUODENOSCOPY (EGD);  Surgeon: Daneil Dolin, MD;  [facility];  Service: Endoscopy;  Laterality: N/A;  8:15am  . ESOPHAGOGASTRODUODENOSCOPY (EGD) WITH ESOPHAGEAL DILATION N/A 01/25/2013   Dr. Gala Romney- schatzki's ring- 37 F dilation. small hiatal hernia  . HYSTEROSCOPY WITH D & C N/A 03/06/2019   Procedure: DILATATION AND CURETTAGE /HYSTEROSCOPY;  Surgeon: Jonnie Kind, MD;  Location: [facility];  Service: Gynecology;  Laterality: N/A;  . [name] DILATION N/A 11/11/2017   Procedure: [name] DILATION;  Surgeon: Daneil Dolin, MD;   [facility];  Service: Endoscopy;  Laterality: N/A;  . POLYPECTOMY N/A 03/06/2019   Procedure: POLYPECTOMY ( REMOVAL OF ENDOMETRIAL POLYP);  Surgeon: Jonnie Kind, MD;  Location: [facility];  Service: Gynecology;  Laterality: N/A;  . TUBAL LIGATION      Current Outpatient Medications  Medication Sig Dispense Refill  . acetaminophen (TYLENOL) 500 MG tablet Take 500 mg by mouth every 6 (six) hours as needed for moderate pain or headache.    . ALPRAZolam (XANAX) 0.5 MG tablet TAKE 1 TABLET(0.5 MG) BY MOUTH TWICE DAILY AS NEEDED FOR ANXIETY (Patient taking differently: Take 0.5 mg by mouth 2 (two) times daily as needed for anxiety. ) 60 tablet 0  . amLODipine (NORVASC) 10 MG tablet TAKE 1 TABLET(10 MG) BY MOUTH AT BEDTIME (Patient taking differently: Take 10 mg by mouth at bedtime. ) 90 tablet 3  . aspirin EC 81 MG tablet Take 81 mg by mouth at bedtime.     . cholecalciferol (VITAMIN D3) 25 MCG (1000 UT) tablet Take 2,000 Units by mouth daily.    . cloNIDine (CATAPRES) 0.1 MG tablet Take 1 tablet (0.1 mg total) by mouth 3 (three) times daily as needed (for high blood pressure >150). 90 tablet 3  . Cyanocobalamin (B-12) 2500 MCG TABS Take 2,500 mcg by mouth daily.    Marland Kitchen EPINEPHrine 0.3 mg/0.3 mL IJ SOAJ injection Inject 0.3 mg into the muscle as needed for anaphylaxis.     . fluticasone (FLONASE) 50 MCG/ACT nasal spray Place 2 sprays into both nostrils daily. 16 g 6  . hydrochlorothiazide (HYDRODIURIL) 25 MG tablet TAKE 1 TABLET(25 MG) BY MOUTH DAILY 90 tablet 2  . hydrOXYzine (ATARAX/VISTARIL) 25 MG tablet Take 0.5-1 tablets (12.5-25 mg total) by mouth every 8 (eight) hours as needed for itching. 20 tablet 0  . ipratropium (ATROVENT) 0.03 % nasal spray USE 2 SPRAYS IN EACH NOSTRIL EVERY 12 HOURS (Patient taking differently: Place 2 sprays into both nostrils See admin instructions. Instill 2 sprays into each nostril each morning ,may do a second dose later as needed for allergies) 30 mL 12    . LINZESS 145 MCG CAPS capsule TAKE 1 CAPSULE(145 MCG) BY MOUTH DAILY BEFORE BREAKFAST 30 capsule 3  . MAGNESIUM PO Take 330 mg by mouth daily.    . metoprolol succinate (TOPROL-XL) 100 MG 24 hr tablet Take 1 tablet (100 mg total) by mouth every evening. Take with or immediately following a meal. 90 tablet 3  . mometasone (ELOCON) 0.1 % cream Apply 1 application topically daily as needed (for irritation).     . montelukast (SINGULAIR) 10 MG tablet TAKE 1 TABLET(10 MG) BY MOUTH AT BEDTIME (Patient taking differently: Take 10 mg by mouth daily as needed (allergies). ) 30 tablet 3  . Omega-3 Fatty Acids (FISH OIL) 1000 MG CAPS Take 1,000 mg by mouth daily.    . pantoprazole (PROTONIX) 40 MG tablet Take 1 tablet (40 mg total) by mouth 2 (two) times daily. Take 30 minutes before meals 60 tablet 5  . potassium chloride SA (KLOR-CON) 20  MEQ tablet TAKE 1 TABLET(20 MEQ) BY MOUTH DAILY 30 tablet 5  . pravastatin (PRAVACHOL) 20 MG tablet Take 1 tablet (20 mg total) by mouth daily. 90 tablet 0  . tacrolimus (PROTOPIC) 0.1 % ointment Apply 1 application topically daily as needed (for irritation).     . triamcinolone cream (KENALOG) 0.1 % APPLY EXTERNALLY TO THE AFFECTED AREA TWICE DAILY 45 g 0  . valsartan (DIOVAN) 320 MG tablet TAKE 1 TABLET BY MOUTH EVERY DAY 90 tablet 3  . vitamin C (ASCORBIC ACID) 250 MG tablet Take 500 mg by mouth daily.    . vitamin E 400 UNIT capsule Take 400 Units by mouth daily.     No current facility-administered medications for this visit.    Allergies as of 05/16/2019 - Review Complete 05/16/2019  Allergen Reaction Noted  . Ciprofloxacin Shortness Of Breath 12/27/2011    Family History  Problem Relation Age of Onset  . Diabetes Other   . Diabetes Mother   . Heart disease Mother   . Heart disease Father   . Diabetes Sister   . Diabetes Brother   . Hypertension Brother   . Diabetes Daughter   . Hypertension Maternal Grandmother   . Stroke Maternal Grandfather   .  Early death Paternal Grandfather   . Colon cancer Neg Hx   . Liver disease Neg Hx     Social History   Socioeconomic History  . Marital status: Single    Spouse name: Not on file  . Number of children: 3  . Years of education: Not on file  . Highest education level: Not on file  Occupational History  . Occupation: English as a second language teacher: [employer]  Tobacco Use  . Smoking status: Former Smoker    Packs/day: 0.25    Years: 35.00    Pack years: 8.75    Types: Cigarettes    Quit date: 12/31/2012    Years since quitting: 6.3  . Smokeless tobacco: Never Used  Substance and Sexual Activity  . Alcohol use: Not Currently    Comment: occasionaly  . Drug use: No  . Sexual activity: Not Currently    Partners: Male    Birth control/protection: Post-menopausal, Surgical    Comment: tubal  Other Topics Concern  . Not on file  Social History Narrative   Eats all food groups.    Wears seatbelt.    Has 3 children.   Prior smoker.   Attends church.    Used to work in Charity fundraiser.    Drives.   Enjoys going out with family.    Social Determinants of Health   Financial Resource Strain:   . Difficulty of Paying Living Expenses: Not on file  Food Insecurity:   . Worried About Charity fundraiser in the Last Year: Not on file  . Ran Out of Food in the Last Year: Not on file  Transportation Needs:   . Lack of Transportation (Medical): Not on file  . Lack of Transportation (Non-Medical): Not on file  Physical Activity:   . Days of Exercise per Week: Not on file  . Minutes of Exercise per Session: Not on file  Stress:   . Feeling of Stress : Not on file  Social Connections:   . Frequency of Communication with Friends and Family: Not on file  . Frequency of Social Gatherings with Friends and Family: Not on file  . Attends Religious Services: Not on file  . Active Member of Clubs or Organizations:  Not on file  . Attends Archivist Meetings: Not on file  . Marital  Status: Not on file    Review of Systems: Gen: Denies fever, chills, lightheadedness, dizziness, presyncope, or syncope. HEENT: Admits to postnasal drip and throat congestion. Some hoarseness. Present for 3 months. Saw ENT about this. Using nasal spray which is helping.  CV: Denies chest pain or palpitations Resp: Denies dyspnea at rest or cough GI: See HPI Derm: Denies rash Psych: Denies depression or anxiety Heme: Denies bruising or bleeding  Physical Exam: BP (!) 151/76   Pulse 68   Temp 97.6 F (36.4 C) (Oral)   Ht 5\' 7"  (1.702 m)   Wt 181 lb (82.1 kg)   BMI 28.35 kg/m  General:   Alert and oriented. No distress noted. Pleasant and cooperative.  Head:  Normocephalic and atraumatic. Eyes:  Conjuctiva clear without scleral icterus. Heart:  S1, S2 present without murmurs appreciated. Lungs:  Clear to auscultation bilaterally. No wheezes, rales, or rhonchi. No distress.  Abdomen:  +BS, soft, non-tender and non-distended. No rebound or guarding. No HSM or masses noted. Msk:  Symmetrical without gross deformities. Normal posture. Extremities:  Without edema. Neurologic:  Alert and  oriented x4 Psych: Normal mood and affect.

## 2019-05-16 ENCOUNTER — Encounter: Payer: Self-pay | Admitting: Gastroenterology

## 2019-05-16 ENCOUNTER — Other Ambulatory Visit: Payer: Self-pay

## 2019-05-16 ENCOUNTER — Ambulatory Visit: Payer: Medicare Other | Admitting: Gastroenterology

## 2019-05-16 VITALS — BP 151/76 | HR 68 | Temp 97.6°F | Ht 67.0 in | Wt 181.0 lb

## 2019-05-16 DIAGNOSIS — K219 Gastro-esophageal reflux disease without esophagitis: Secondary | ICD-10-CM | POA: Diagnosis not present

## 2019-05-16 DIAGNOSIS — R1310 Dysphagia, unspecified: Secondary | ICD-10-CM

## 2019-05-16 DIAGNOSIS — R1319 Other dysphagia: Secondary | ICD-10-CM

## 2019-05-16 DIAGNOSIS — K5900 Constipation, unspecified: Secondary | ICD-10-CM

## 2019-05-16 NOTE — Patient Instructions (Addendum)
Continue Protonix 40 mg twice daily 30 minutes before breakfast and 30 minutes before dinner.  Continue following a GERD diet.  Avoid fried, fatty, greasy, spicy foods.  Avoid soda, carbonated beverages, and caffeine.  Do not eat within 3 hours of laying down.  Continue to monitor your occasional swallowing trouble.  If this becomes more frequent, please let us know.  If food gets hung in your esophagus and will not come up or go down, you should proceed to the emergency department.  Continue Linzess 145 mcg daily daily 30 minutes before your first meal.  We will plan to follow-up with you in 6 months.  Do not hesitate to call if you have questions or concerns prior.  [name], PA-C [facility]   Food Choices for Gastroesophageal Reflux Disease, Adult When you have gastroesophageal reflux disease (GERD), the foods you eat and your eating habits are very important. Choosing the right foods can help ease your discomfort. Think about working with a nutrition specialist (dietitian) to help you make good choices. What are tips for following this plan?  Meals  Choose healthy foods that are low in fat, such as fruits, vegetables, whole grains, low-fat dairy products, and lean meat, fish, and poultry.  Eat small meals often instead of 3 large meals a day. Eat your meals slowly, and in a place where you are relaxed. Avoid bending over or lying down until 2-3 hours after eating.  Avoid eating meals 2-3 hours before bed.  Avoid drinking a lot of liquid with meals.  Cook foods using methods other than frying. Bake, grill, or broil food instead.  Avoid or limit: ? Chocolate. ? Peppermint or spearmint. ? Alcohol. ? Pepper. ? Black and decaffeinated coffee. ? Black and decaffeinated tea. ? Bubbly (carbonated) soft drinks. ? Caffeinated energy drinks and soft drinks.  Limit high-fat foods such as: ? Fatty meat or fried foods. ? Whole milk, cream, butter, or ice  cream. ? Nuts and nut butters. ? Pastries, donuts, and sweets made with butter or shortening.  Avoid foods that cause symptoms. These foods may be different for everyone. Common foods that cause symptoms include: ? Tomatoes. ? Oranges, lemons, and limes. ? Peppers. ? Spicy food. ? Onions and garlic. ? Vinegar. Lifestyle  Maintain a healthy weight. Ask your doctor what weight is healthy for you. If you need to lose weight, work with your doctor to do so safely.  Exercise for at least 30 minutes for 5 or more days each week, or as told by your doctor.  Wear loose-fitting clothes.  Do not smoke. If you need help quitting, ask your doctor.  Sleep with the head of your bed higher than your feet. Use a wedge under the mattress or blocks under the bed frame to raise the head of the bed. Summary  When you have gastroesophageal reflux disease (GERD), food and lifestyle choices are very important in easing your symptoms.  Eat small meals often instead of 3 large meals a day. Eat your meals slowly, and in a place where you are relaxed.  Limit high-fat foods such as fatty meat or fried foods.  Avoid bending over or lying down until 2-3 hours after eating.  Avoid peppermint and spearmint, caffeine, alcohol, and chocolate. This information is not intended to replace advice given to you by your health care provider. Make sure you discuss any questions you have with your health care provider. Document Revised: 08/17/2018 Document Reviewed: 06/01/2016 Elsevier Patient Education  2020 Elsevier  Inc.  

## 2019-05-17 NOTE — Assessment & Plan Note (Signed)
History of dysphagia requiring esophageal dilations that provide good improvement in symptoms.  Last EGD in July 2019 with mild Schatzki ring s/p dilation.  Currently with occasional dysphagia symptoms, typically to breads or meats.  Occurs less than once a month.  She is not interested in an EGD at this time and wants to continue to monitor.  GERD is well controlled on Protonix 40 mg twice daily at this time.  Continue Protonix 40 mg twice daily. Continue to monitor dysphagia symptoms.  She was advised to call us if symptoms become more frequent/troublesome. If something were to get hung in her esophagus and not come up or go down, she was advised to go to the emergency department. Follow-up in 6 months.

## 2019-05-17 NOTE — Assessment & Plan Note (Signed)
Well-controlled on Protonix 40 mg twice daily.  Had one episode of breakthrough symptoms about 3 weeks ago after eating something spicy.  Otherwise she is doing well with the addition avoiding fried, fatty, and greasy foods.  Rare soda.   Continue Protonix 40 mg twice daily. Reemphasized GERD diet and handout provided. Follow-up in 6 months.

## 2019-05-17 NOTE — Assessment & Plan Note (Signed)
Well-controlled on Linzess 145 mcg daily.  No alarm symptoms.  Colonoscopy up-to-date in 2017 and due for repeat in 2022.  Continue Linzess 145 mcg daily. Follow-up in 6 months.

## 2019-05-18 NOTE — Progress Notes (Signed)
Cc'ed to pcp °

## 2019-06-02 ENCOUNTER — Other Ambulatory Visit: Payer: Self-pay | Admitting: Gastroenterology

## 2019-06-02 DIAGNOSIS — K219 Gastro-esophageal reflux disease without esophagitis: Secondary | ICD-10-CM

## 2019-06-02 DIAGNOSIS — R1013 Epigastric pain: Secondary | ICD-10-CM

## 2019-06-05 ENCOUNTER — Other Ambulatory Visit: Payer: Self-pay

## 2019-06-08 DIAGNOSIS — J310 Chronic rhinitis: Secondary | ICD-10-CM | POA: Diagnosis not present

## 2019-06-08 DIAGNOSIS — H903 Sensorineural hearing loss, bilateral: Secondary | ICD-10-CM | POA: Diagnosis not present

## 2019-06-08 DIAGNOSIS — J343 Hypertrophy of nasal turbinates: Secondary | ICD-10-CM | POA: Diagnosis not present

## 2019-06-08 DIAGNOSIS — H6123 Impacted cerumen, bilateral: Secondary | ICD-10-CM | POA: Diagnosis not present

## 2019-06-08 DIAGNOSIS — R0982 Postnasal drip: Secondary | ICD-10-CM | POA: Diagnosis not present

## 2019-06-08 DIAGNOSIS — H9312 Tinnitus, left ear: Secondary | ICD-10-CM | POA: Diagnosis not present

## 2019-06-17 ENCOUNTER — Other Ambulatory Visit: Payer: Self-pay | Admitting: Family Medicine

## 2019-07-04 ENCOUNTER — Other Ambulatory Visit: Payer: Self-pay | Admitting: Family Medicine

## 2019-07-04 MED ORDER — PRAVASTATIN SODIUM 20 MG PO TABS: 20.0000 mg | ORAL_TABLET | Freq: Every day | ORAL | 1 refills | Status: DC

## 2019-07-09 ENCOUNTER — Telehealth (INDEPENDENT_AMBULATORY_CARE_PROVIDER_SITE_OTHER): Payer: Medicare Other | Admitting: Nurse Practitioner

## 2019-07-09 ENCOUNTER — Ambulatory Visit: Payer: Medicare Other | Attending: Internal Medicine

## 2019-07-09 ENCOUNTER — Other Ambulatory Visit: Payer: Self-pay

## 2019-07-09 DIAGNOSIS — Z20822 Contact with and (suspected) exposure to covid-19: Secondary | ICD-10-CM

## 2019-07-09 DIAGNOSIS — J019 Acute sinusitis, unspecified: Secondary | ICD-10-CM

## 2019-07-09 DIAGNOSIS — B9789 Other viral agents as the cause of diseases classified elsewhere: Secondary | ICD-10-CM

## 2019-07-09 NOTE — Progress Notes (Signed)
Virtual Visit via Video    I connected withGloria Williams on 07/09/2019 at 3:34PM  by videoand verified that I am speaking with the correct person using two identifiers.  Pt location: at home   Physician location:  In office, [place], Ishmael Holter, FNP-C    On call: patient and physician   I discussed the limitations, risks, security and privacy concerns of performing an evaluation and management service by telephone and the availability of in person appointments. I also discussed with the patient that there may be a patient responsible charge related to this service. The patient expressed understanding and agreed to proceed.   History of Present Illness: Pt is a pleasant African American 64 y.o. female. Video visit for loss of taste and smell on Friday night. Exposure to her dgtr who is a traveling nurse and has similar sxs that started the day or 2 before and was tested Saturday pending results. Other sxs are nasal congestion, mild h/a on and off same as her usual h/a, chills, temperature no greater than 99.0 orally. Denies SOB, bodyaches/ no vomiting or diarrhea. She has taken her usual allergy medications and tylenol with minimal decrease in her sxs.    Observations/Objective: NAD noted, able to speak in full sentences but very congested sounding, harsh cough, no wheeze heard   Assessment and Plan: Treat symptoms with over the counter medications such as tylenol, nasal decongestants.  Covid test completed today pending result Self isolate for 10 days Seek medical attention for non resolving or worsening sxs, call 911 or go to ER for shortness of breath.    Follow Up Instructions:  I discussed the assessment and treatment plan with the patient. The patient was provided an opportunity to ask questions and all were answered. The patient agreed with the plan and demonstrated an understanding of the instructions.  The patient was advised to call back  or seek an in-person evaluation if the symptoms worsen or if the condition fails to improve as anticipated.  I provided 10  minutes of non-face-to-face time during this encounter. End Time 3:44PM Ishmael Holter, FNP-C

## 2019-07-10 LAB — NOVEL CORONAVIRUS, NAA: SARS-CoV-2, NAA: DETECTED — AB

## 2019-07-11 ENCOUNTER — Other Ambulatory Visit: Payer: Self-pay | Admitting: Physician Assistant

## 2019-07-11 ENCOUNTER — Encounter: Payer: Self-pay | Admitting: Physician Assistant

## 2019-07-11 ENCOUNTER — Ambulatory Visit (HOSPITAL_COMMUNITY)
Admission: RE | Admit: 2019-07-11 | Discharge: 2019-07-11 | Disposition: A | Discharge status: Home / Self Care | Payer: Medicare Other | Source: Ambulatory Visit | Attending: Pulmonary Disease | Admitting: Pulmonary Disease

## 2019-07-11 ENCOUNTER — Other Ambulatory Visit: Payer: Medicare Other

## 2019-07-11 ENCOUNTER — Other Ambulatory Visit: Payer: Self-pay | Admitting: Family Medicine

## 2019-07-11 DIAGNOSIS — U071 COVID-19: Secondary | ICD-10-CM

## 2019-07-11 DIAGNOSIS — I10 Essential (primary) hypertension: Secondary | ICD-10-CM | POA: Diagnosis present

## 2019-07-11 DIAGNOSIS — I714 Abdominal aortic aneurysm, without rupture, unspecified: Secondary | ICD-10-CM

## 2019-07-11 DIAGNOSIS — Z23 Encounter for immunization: Secondary | ICD-10-CM | POA: Diagnosis not present

## 2019-07-11 DIAGNOSIS — E785 Hyperlipidemia, unspecified: Secondary | ICD-10-CM | POA: Insufficient documentation

## 2019-07-11 DIAGNOSIS — R7989 Other specified abnormal findings of blood chemistry: Secondary | ICD-10-CM

## 2019-07-11 MED ORDER — SODIUM CHLORIDE 0.9 % IV SOLN: INTRAVENOUS | Status: DC | PRN

## 2019-07-11 MED ORDER — DIPHENHYDRAMINE HCL 50 MG/ML IJ SOLN: 50.0000 mg | Freq: Once | INTRAMUSCULAR | Status: DC | PRN

## 2019-07-11 MED ORDER — SODIUM CHLORIDE 0.9 % IV SOLN
700.0000 mg | Freq: Once | INTRAVENOUS | Status: AC
  Administered 2019-07-11: 700 mg via INTRAVENOUS
  Filled 2019-07-11: qty 20

## 2019-07-11 MED ORDER — EPINEPHRINE 0.3 MG/0.3ML IJ SOAJ: 0.3000 mg | Freq: Once | INTRAMUSCULAR | Status: DC | PRN

## 2019-07-11 MED ORDER — METHYLPREDNISOLONE SODIUM SUCC 125 MG IJ SOLR: 125.0000 mg | Freq: Once | INTRAMUSCULAR | Status: DC | PRN

## 2019-07-11 MED ORDER — ALBUTEROL SULFATE HFA 108 (90 BASE) MCG/ACT IN AERS: 2.0000 | INHALATION_SPRAY | Freq: Once | RESPIRATORY_TRACT | Status: DC | PRN

## 2019-07-11 MED ORDER — FAMOTIDINE IN NACL 20-0.9 MG/50ML-% IV SOLN: 20.0000 mg | Freq: Once | INTRAVENOUS | Status: DC | PRN

## 2019-07-11 NOTE — Progress Notes (Signed)
  Diagnosis: COVID-19  Physician: Dr. Joya Gaskins  Procedure: [facility] Med: bamlanivimab infusion - Provided patient with bamlanimivab fact sheet for patients, parents and caregivers prior to infusion.  Complications: No immediate complications noted.  Discharge: Discharged home   Kathlee Barnhardt, Cleaster Cor[place] 07/11/2019

## 2019-07-11 NOTE — Progress Notes (Signed)
  I connected by phone with Danice Goltz on 07/11/2019 at 8:58 AM to discuss the potential use of an new treatment for mild to moderate COVID-19 viral infection in non-hospitalized patients.  This patient is a 64 y.o. female that meets the FDA criteria for Emergency Use Authorization of bamlanivimab or casirivimab\imdevimab.  Has a (+) direct SARS-CoV-2 viral test result  Has mild or moderate COVID-19   Is ? 66 years of age and weighs ? 40 kg  Is NOT hospitalized due to COVID-19  Is NOT requiring oxygen therapy or requiring an increase in baseline oxygen flow rate due to COVID-19  Is within 10 days of symptom onset  Has at least one of the high risk factor(s) for progression to severe COVID-19 and/or hospitalization as defined in EUA.  Specific high risk criteria : >/= 66 yo, HTN, CVD   I have spoken and communicated the following to the patient or parent/caregiver:  1. FDA has authorized the emergency use of bamlanivimab and casirivimab\imdevimab for the treatment of mild to moderate COVID-19 in adults and pediatric patients with positive results of direct SARS-CoV-2 viral testing who are 63 years of age and older weighing at least 40 kg, and who are at high risk for progressing to severe COVID-19 and/or hospitalization.  2. The significant known and potential risks and benefits of bamlanivimab and casirivimab\imdevimab, and the extent to which such potential risks and benefits are unknown.  3. Information on available alternative treatments and the risks and benefits of those alternatives, including clinical trials.  4. Patients treated with bamlanivimab and casirivimab\imdevimab should continue to self-isolate and use infection control measures (e.g., wear mask, isolate, social distance, avoid sharing personal items, clean and disinfect "high touch" surfaces, and frequent handwashing) according to CDC guidelines.   5. The patient or parent/caregiver has the option to accept or  refuse bamlanivimab or casirivimab\imdevimab .  After reviewing this information with the patient, The patient agreed to proceed with receiving the bamlanimivab infusion and will be provided a copy of the Fact sheet prior to receiving the infusion.   Sx onset 2/26. Set up for infusion today at 12:30pm. Directions given.   Angelena Form PA-C 07/11/2019 8:58 AM

## 2019-07-11 NOTE — Discharge Instructions (Signed)

## 2019-07-12 ENCOUNTER — Telehealth: Payer: Self-pay | Admitting: *Deleted

## 2019-07-12 NOTE — Telephone Encounter (Signed)
Received call from patient.   Reports that she is COVID + and has had infusion on 07/11/2019.  Reports that she continues to have fever (100+). States that she feels she is taking too much APAP.   Advised to alternate APAP with IBU.   States that she is also having her BP drop. Reports that BP on 07/11/2019 noted at 57/64. Advised that this is not a possible reading and writer feels that this was in error. Advised to monitor BP and if noted <100/60, hold BP medications. Advised BP on 07/12/2019 noted at 146/85.  Also states that her HR is elevated at rest. States that er HR is currently 102. Advised that elevated HR is normal during illness. Advised to monitor for palpitations or chest pain. Advised if either noted, go to ER.

## 2019-07-18 ENCOUNTER — Other Ambulatory Visit: Payer: Self-pay | Admitting: Family Medicine

## 2019-07-28 ENCOUNTER — Other Ambulatory Visit: Payer: Self-pay | Admitting: Family Medicine

## 2019-07-28 DIAGNOSIS — F419 Anxiety disorder, unspecified: Secondary | ICD-10-CM

## 2019-07-30 NOTE — Telephone Encounter (Signed)
Ok to refill??  Last office visit 03/15/2019.  Last refill 11/02/2018.

## 2019-07-31 ENCOUNTER — Other Ambulatory Visit: Payer: Self-pay

## 2019-07-31 ENCOUNTER — Encounter: Payer: Self-pay | Admitting: Family Medicine

## 2019-07-31 ENCOUNTER — Ambulatory Visit (INDEPENDENT_AMBULATORY_CARE_PROVIDER_SITE_OTHER): Payer: Medicare Other | Admitting: Family Medicine

## 2019-07-31 VITALS — BP 160/70 | HR 70 | Temp 97.2°F | Resp 14 | Ht 67.0 in | Wt 181.0 lb

## 2019-07-31 DIAGNOSIS — R82998 Other abnormal findings in urine: Secondary | ICD-10-CM | POA: Diagnosis not present

## 2019-07-31 DIAGNOSIS — E785 Hyperlipidemia, unspecified: Secondary | ICD-10-CM | POA: Diagnosis not present

## 2019-07-31 DIAGNOSIS — I10 Essential (primary) hypertension: Secondary | ICD-10-CM | POA: Diagnosis not present

## 2019-07-31 DIAGNOSIS — R7989 Other specified abnormal findings of blood chemistry: Secondary | ICD-10-CM | POA: Diagnosis not present

## 2019-07-31 LAB — URINALYSIS, ROUTINE W REFLEX MICROSCOPIC
Bilirubin Urine: NEGATIVE
Glucose, UA: NEGATIVE
Hgb urine dipstick: NEGATIVE
Ketones, ur: NEGATIVE
Leukocytes,Ua: NEGATIVE
Nitrite: NEGATIVE
Protein, ur: NEGATIVE
Specific Gravity, Urine: 1.001 (ref 1.001–1.03)
pH: 6.5 (ref 5.0–8.0)

## 2019-07-31 NOTE — Progress Notes (Signed)
Subjective:    Patient ID: [name], female    DOB: [birth year], 64 y.o.   MRN: [id number]  HPI  12/20 Patient was seen in the emergency room at the end of September.  During that visit a CMP was ordered and her liver function tests were completely normal in the 30s.  Recently she underwent surgery under the care of her gynecologist.  Lab work obtained during the surgery showed elevated liver function test with an AST and ALT in the 70s and 100 level.  Patient denies any recent virus.  She denies any recent food poisoning.  She denies any diarrhea or jaundice.  She denies any exposure to viral hepatitis.  She denies any recent flulike illness.  She is not drinking any alcohol.  She does not take any Tylenol.  She has not been taking any ibuprofen.  However she is on Lipitor 80 mg a day.  Her hemoglobin A1c was also found to be elevated at 6.6.  This is been steadily increasing over the last few years.  She is currently managed with diet.  At that time, my plan was: I believe her liver function tests are likely due to the Lipitor.  I recommended holding the Lipitor and rechecking liver function test in a week.  I will continue to check liver function test until they return to normal.  If they are not improving at all in a week I would recommend a right upper quadrant ultrasound as well as an ANA, sedimentation rate, and antiliver kidney microsomal antibody test, viral hepatitis panel.  We also discussed a low carbohydrate diet today, 30 minutes of aerobic exercise daily, and 10 to 15 pounds of weight loss to help address her borderline type 2 diabetes mellitus.  07/31/19 Patient presents today to recheck her liver function test.  At her last visit we discontinued her Lipitor and her liver function test improved.  However we then resume pravastatin due to her elevated cholesterol and wanted to recheck her liver function test in 3 months.  This prompted today's visit.  She also has a history of  hypokalemia and she is due to recheck potassium.  Her blood pressure today is significantly elevated at 160/70 however she denies any chest pain or shortness of breath or dyspnea on exertion.  She has documented whitecoat syndrome.  She checked her blood pressure on her blood pressure cuff prior to leaving her home and it was 126/76.  She has brought her blood pressure cuff by my office and we have verified that it is accurate.  Patient gets extremely nervous in the doctor's office and she believes this causes her blood pressure to be elevated.  We also checked her urine today glucose patient is concerned because she has developed low back pain over the last week.  The pain is located roughly at the level of L5 and to the right side of the spine.  It is a dull aching pain that tends to be worse at night when she is trying to sleep.  She denies any specific injuries.  It began gradually.  She denies any dysuria or hematuria.  Urinalysis today is completely normal with no blood, no leukocyte esterase, no nitrites.  She has no CVA tenderness.  She does have some mild tenderness to palpation in the right lumbar paraspinal muscle suggesting muscle strain.  Movement does exacerbate pain. Past Medical History:  Diagnosis Date  . Abdominal aortic aneurysm (AAA) 3.0 cm to 5.0 cm in diameter  in female [facility])    None seen on prior imaging?   Marland Kitchen Anxiety   . Cataract   . Chronic left shoulder pain   . Headache   . Hiatal hernia    small  . Hyperlipidemia   . Hypertension   . Pre-diabetes   . Schatzki's ring   . Tremor    Past Surgical History:  Procedure Laterality Date  . CARDIAC CATHETERIZATION  2005   "[name] has essentially normal coronary arteries and normal left ventricular function.  She will be treated empirically with anti-reflux measures"  . CATARACT EXTRACTION, BILATERAL    . CHOLECYSTECTOMY    . COLONOSCOPY    06/16/2004   [id number] rectum/Diminutive polyps of the splenic flexure and the  cecum/The remainder of the colonic mucosa appeared normal pathology (hyperplastic polyps).  . COLONOSCOPY N/A 08/07/2015   Dr. Gala Romney: 6 mm descending colon tubular adenoma, multiple medium-mouthed diverticula in sigmoid colon. surveillance 2022  . ESOPHAGOGASTRODUODENOSCOPY  09/11/2001   [id number] ring otherwise normal esophagus/Normal stomach/Normal D1 and D2 status post passage of a 56 [place] dilator  . ESOPHAGOGASTRODUODENOSCOPY N/A 11/11/2017   Procedure: ESOPHAGOGASTRODUODENOSCOPY (EGD);  Surgeon: Daneil Dolin, MD;  Location[facility];  Service: Endoscopy;  Laterality: N/A;  8:15am  . ESOPHAGOGASTRODUODENOSCOPY (EGD) WITH ESOPHAGEAL DILATION N/A 01/25/2013   Dr. Gala Romney- schatzki's ring- 56 F dilation. small hiatal hernia  . HYSTEROSCOPY WITH D & C N/A 03/06/2019   Procedure: DILATATION AND CURETTAGE /HYSTEROSCOPY;  Surgeon: Jonnie Kind, MD;  Location: [facility] ORS;  Service: Gynecology;  Laterality: N/A;  . [name] DILATION N/A 11/11/2017   Procedure: [name] DILATION;  Surgeon: Daneil Dolin, MD;  Location: [facility];  Service: Endoscopy;  Laterality: N/A;  . POLYPECTOMY N/A 03/06/2019   Procedure: POLYPECTOMY ( REMOVAL OF ENDOMETRIAL POLYP);  Surgeon: Jonnie Kind, MD;  Location: [facility] ORS;  Service: Gynecology;  Laterality: N/A;  . TUBAL LIGATION     Current Outpatient Medications on File Prior to Visit  Medication Sig Dispense Refill  . acetaminophen (TYLENOL) 500 MG tablet Take 500 mg by mouth every 6 (six) hours as needed for moderate pain or headache.    . ALPRAZolam (XANAX) 0.5 MG tablet TAKE 1 TABLET(0.5 MG) BY MOUTH TWICE DAILY AS NEEDED FOR ANXIETY 60 tablet 0  . amLODipine (NORVASC) 10 MG tablet TAKE 1 TABLET(10 MG) BY MOUTH AT BEDTIME (Patient taking differently: Take 10 mg by mouth at bedtime. ) 90 tablet 3  . aspirin EC 81 MG tablet Take 81 mg by mouth at bedtime.     . cholecalciferol (VITAMIN D3) 25 MCG (1000 UT) tablet Take 2,000 Units by mouth daily.     . cloNIDine (CATAPRES) 0.1 MG tablet TAKE 1 TABLET BY MOUTH THREE TIMES DAILY AS NEEDED FOR HIGH BLOOD PRESSURE 90 tablet 3  . Cyanocobalamin (B-12) 2500 MCG TABS Take 2,500 mcg by mouth daily.    Marland Kitchen EPINEPHrine 0.3 mg/0.3 mL IJ SOAJ injection Inject 0.3 mg into the muscle as needed for anaphylaxis.     . fluticasone (FLONASE) 50 MCG/ACT nasal spray Place 2 sprays into both nostrils daily. 16 g 6  . hydrochlorothiazide (HYDRODIURIL) 25 MG tablet TAKE 1 TABLET(25 MG) BY MOUTH DAILY 90 tablet 2  . hydrOXYzine (ATARAX/VISTARIL) 25 MG tablet Take 0.5-1 tablets (12.5-25 mg total) by mouth every 8 (eight) hours as needed for itching. 20 tablet 0  . ipratropium (ATROVENT) 0.03 % nasal spray USE 2 SPRAYS IN EACH NOSTRIL EVERY 12 HOURS (  Patient taking differently: Place 2 sprays into both nostrils See admin instructions. Instill 2 sprays into each nostril each morning ,may do a second dose later as needed for allergies) 30 mL 12  . LINZESS 145 MCG CAPS capsule TAKE 1 CAPSULE(145 MCG) BY MOUTH DAILY BEFORE BREAKFAST 30 capsule 3  . MAGNESIUM PO Take 330 mg by mouth daily.    . metoprolol succinate (TOPROL-XL) 100 MG 24 hr tablet Take 1 tablet (100 mg total) by mouth every evening. Take with or immediately following a meal. 90 tablet 3  . mometasone (ELOCON) 0.1 % cream Apply 1 application topically daily as needed (for irritation).     . montelukast (SINGULAIR) 10 MG tablet TAKE 1 TABLET(10 MG) BY MOUTH AT BEDTIME (Patient taking differently: Take 10 mg by mouth daily as needed (allergies). ) 30 tablet 3  . Omega-3 Fatty Acids (FISH OIL) 1000 MG CAPS Take 1,000 mg by mouth daily.    . pantoprazole (PROTONIX) 40 MG tablet TAKE 1 TABLET BY MOUTH TWICE DAILY. TAKE 30 MINUTES BEFORE MEALS 60 tablet 5  . potassium chloride SA (KLOR-CON) 20 MEQ tablet TAKE 1 TABLET(20 MEQ) BY MOUTH DAILY 30 tablet 5  . pravastatin (PRAVACHOL) 20 MG tablet Take 1 tablet (20 mg total) by mouth daily. 90 tablet 1  . tacrolimus  (PROTOPIC) 0.1 % ointment Apply 1 application topically daily as needed (for irritation).     . triamcinolone cream (KENALOG) 0.1 % APPLY EXTERNALLY TO THE AFFECTED AREA TWICE DAILY 45 g 0  . valsartan (DIOVAN) 320 MG tablet TAKE 1 TABLET BY MOUTH EVERY DAY 90 tablet 3  . vitamin C (ASCORBIC ACID) 250 MG tablet Take 500 mg by mouth daily.    . vitamin E 400 UNIT capsule Take 400 Units by mouth daily.     No current facility-administered medications on file prior to visit.   Allergies  Allergen Reactions  . Ciprofloxacin Shortness Of Breath   Social History   Socioeconomic History  . Marital status: Single    Spouse name: Not on file  . Number of children: 3  . Years of education: Not on file  . Highest education level: Not on file  Occupational History  . Occupation: English as a second language teacher: [employer]  Tobacco Use  . Smoking status: Former Smoker    Packs/day: 0.25    Years: 35.00    Pack years: 8.75    Types: Cigarettes    Quit date: 12/31/2012    Years since quitting: 6.5  . Smokeless tobacco: Never Used  Substance and Sexual Activity  . Alcohol use: Not Currently    Comment: occasionaly  . Drug use: No  . Sexual activity: Not Currently    Partners: Male    Birth control/protection: Post-menopausal, Surgical    Comment: tubal  Other Topics Concern  . Not on file  Social History Narrative   Eats all food groups.    Wears seatbelt.    Has 3 children.   Prior smoker.   Attends church.    Used to work in Charity fundraiser.    Drives.   Enjoys going out with family.    Social Determinants of Health   Financial Resource Strain:   . Difficulty of Paying Living Expenses:   Food Insecurity:   . Worried About Charity fundraiser in the Last Year:   . Arboriculturist in the Last Year:   Transportation Needs:   . Film/video editor (Medical):   Marland Kitchen  Lack of Transportation (Non-Medical):   Physical Activity:   . Days of Exercise per Week:   . Minutes of Exercise  per Session:   Stress:   . Feeling of Stress :   Social Connections:   . Frequency of Communication with Friends and Family:   . Frequency of Social Gatherings with Friends and Family:   . Attends Religious Services:   . Active Member of Clubs or Organizations:   . Attends Archivist Meetings:   Marland Kitchen Marital Status:   Intimate Partner Violence:   . Fear of Current or Ex-Partner:   . Emotionally Abused:   Marland Kitchen Physically Abused:   . Sexually Abused:    Family History  Problem Relation Age of Onset  . Diabetes Other   . Diabetes Mother   . Heart disease Mother   . Heart disease Father   . Diabetes Sister   . Diabetes Brother   . Hypertension Brother   . Diabetes Daughter   . Hypertension Maternal Grandmother   . Stroke Maternal Grandfather   . Early death Paternal Grandfather   . Colon cancer Neg Hx   . Liver disease Neg Hx       Review of Systems  All other systems reviewed and are negative.      Objective:   Physical Exam Vitals reviewed.  Constitutional:      Appearance: She is well-developed.  Cardiovascular:     Rate and Rhythm: Normal rate and regular rhythm.     Heart sounds: Normal heart sounds. No murmur. No friction rub. No gallop.   Pulmonary:     Effort: Pulmonary effort is normal. No respiratory distress.     Breath sounds: Normal breath sounds. No stridor. No wheezing or rales.  Chest:     Chest wall: No tenderness.  Musculoskeletal:     Lumbar back: Tenderness present. No swelling, deformity, lacerations, spasms or bony tenderness. Normal range of motion. Negative right straight leg raise test and negative left straight leg raise test.       Back:  Neurological:     Motor: No abnormal muscle tone.           Assessment & Plan:  Dark urine - Plan: Urinalysis, Routine w reflex microscopic  Benign essential HTN - Plan: COMPLETE METABOLIC PANEL WITH GFR  Elevated LFTs  Hyperlipidemia LDL goal <70  Urinalysis today is completely  normal.  There is no evidence of urinary tract infection.  Blood pressure today is elevated however the patient has documented whitecoat syndrome and this is likely due to that.  Her home blood pressures however are well controlled.  I will repeat a CMP to monitor her elevated liver function test to ensure that the pravastatin is not irritating her liver like the Lipitor.  I will also check her potassium on CMP as well.  I would like her to return at her earliest convenience to check a fasting lipid panel to ensure that her LDL cholesterol is less than 70 however she has eaten something today.

## 2019-08-01 LAB — COMPLETE METABOLIC PANEL WITH GFR
AG Ratio: 1.3 (calc) (ref 1.0–2.5)
ALT: 16 U/L (ref 6–29)
AST: 18 U/L (ref 10–35)
Albumin: 4 g/dL (ref 3.6–5.1)
Alkaline phosphatase (APISO): 77 U/L (ref 37–153)
BUN/Creatinine Ratio: 7 (calc) (ref 6–22)
BUN: 6 mg/dL — ABNORMAL LOW (ref 7–25)
CO2: 26 mmol/L (ref 20–32)
Calcium: 9.5 mg/dL (ref 8.6–10.4)
Chloride: 104 mmol/L (ref 98–110)
Creat: 0.81 mg/dL (ref 0.50–0.99)
GFR, Est African American: 88 mL/min/{1.73_m2} (ref >=60)
GFR, Est Non African American: 76 mL/min/{1.73_m2} (ref >=60)
Globulin: 3.1 g/dL (calc) (ref 1.9–3.7)
Glucose, Bld: 90 mg/dL (ref 65–99)
Potassium: 4 mmol/L (ref 3.5–5.3)
Sodium: 140 mmol/L (ref 135–146)
Total Bilirubin: 0.6 mg/dL (ref 0.2–1.2)
Total Protein: 7.1 g/dL (ref 6.1–8.1)

## 2019-08-11 ENCOUNTER — Other Ambulatory Visit: Payer: Self-pay | Admitting: Family Medicine

## 2019-08-11 DIAGNOSIS — J329 Chronic sinusitis, unspecified: Secondary | ICD-10-CM

## 2019-08-16 ENCOUNTER — Encounter: Payer: Self-pay | Admitting: Family Medicine

## 2019-08-16 ENCOUNTER — Ambulatory Visit (INDEPENDENT_AMBULATORY_CARE_PROVIDER_SITE_OTHER): Payer: Medicare Other | Admitting: Family Medicine

## 2019-08-16 ENCOUNTER — Other Ambulatory Visit: Payer: Self-pay

## 2019-08-16 VITALS — BP 128/72 | HR 78 | Temp 97.3°F | Resp 16 | Ht 67.0 in | Wt 176.0 lb

## 2019-08-16 DIAGNOSIS — I10 Essential (primary) hypertension: Secondary | ICD-10-CM

## 2019-08-16 DIAGNOSIS — F419 Anxiety disorder, unspecified: Secondary | ICD-10-CM

## 2019-08-16 DIAGNOSIS — M6530 Trigger finger, unspecified finger: Secondary | ICD-10-CM | POA: Diagnosis not present

## 2019-08-16 NOTE — Progress Notes (Signed)
Subjective:    Patient ID: [name], female    DOB: [birth year], 64 y.o.   MRN: [id number]  HPI  Patient presents today with 2 issues.  First she states that her blood pressure is "shooting up randomly".  She states that the majority of the time her blood pressure is well controlled similar to the numbers she has today which is 128/72.  However at other times, her blood pressure will rise suddenly to [id number] systolic unprovoked for no reason even without activity.  She does report that anxiety does tend to cause this and the pain tends to cause it.  She denies any chest pain shortness of breath or dyspnea on exertion.  She denies any severe headaches to suggest a pheochromocytoma or tachycardia.  If she takes clonidine, her blood pressure will come back down rapidly however it leaves her feeling groggy and tired for the rest of the day and she does not like the way the medication makes her feel.  Second issue is a trigger finger in her right hand at the third MCP joint.  She states that that finger will lock whenever she flexes it particularly at night.  It will hurt and she will physically have to extended.  However is not doing today on exam.  There is no abnormality on her physical exam today. Past Medical History:  Diagnosis Date  . Abdominal aortic aneurysm (AAA) 3.0 cm to 5.0 cm in diameter in female [facility])    None seen on prior imaging?   Marland Kitchen Anxiety   . Cataract   . Chronic left shoulder pain   . Headache   . Hiatal hernia    small  . Hyperlipidemia   . Hypertension   . Pre-diabetes   . Schatzki's ring   . Tremor    Past Surgical History:  Procedure Laterality Date  . CARDIAC CATHETERIZATION  2005   "Ms. Ellingboe has essentially normal coronary arteries and normal left ventricular function.  She will be treated empirically with anti-reflux measures"  . CATARACT EXTRACTION, BILATERAL    . CHOLECYSTECTOMY    . COLONOSCOPY    06/16/2004   [id number] rectum/Diminutive polyps  of the splenic flexure and the cecum/The remainder of the colonic mucosa appeared normal pathology (hyperplastic polyps).  . COLONOSCOPY N/A 08/07/2015   Dr. Gala Romney: 6 mm descending colon tubular adenoma, multiple medium-mouthed diverticula in sigmoid colon. surveillance 2022  . ESOPHAGOGASTRODUODENOSCOPY  09/11/2001   [id number] ring otherwise normal esophagus/Normal stomach/Normal D1 and D2 status post passage of a 56 [place] Maloney dilator  . ESOPHAGOGASTRODUODENOSCOPY N/A 11/11/2017   Procedure: ESOPHAGOGASTRODUODENOSCOPY (EGD);  Surgeon: Daneil Dolin, MD;  [facility];  Service: Endoscopy;  Laterality: N/A;  8:15am  . ESOPHAGOGASTRODUODENOSCOPY (EGD) WITH ESOPHAGEAL DILATION N/A 01/25/2013   Dr. Gala Romney- schatzki's ring- 9 F dilation. small hiatal hernia  . HYSTEROSCOPY WITH D & C N/A 03/06/2019   Procedure: DILATATION AND CURETTAGE /HYSTEROSCOPY;  Surgeon: Jonnie Kind, MD;  Location: [facility];  Service: Gynecology;  Laterality: N/A;  . [name] DILATION N/A 11/11/2017   Procedure: [name] DILATION;  Surgeon: Daneil Dolin, MD;  [facility];  Service: Endoscopy;  Laterality: N/A;  . POLYPECTOMY N/A 03/06/2019   Procedure: POLYPECTOMY ( REMOVAL OF ENDOMETRIAL POLYP);  Surgeon: Jonnie Kind, MD;  Location: [facility];  Service: Gynecology;  Laterality: N/A;  . TUBAL LIGATION     Current Outpatient Medications on File Prior to Visit  Medication Sig Dispense Refill  .  acetaminophen (TYLENOL) 500 MG tablet Take 500 mg by mouth every 6 (six) hours as needed for moderate pain or headache.    . ALPRAZolam (XANAX) 0.5 MG tablet TAKE 1 TABLET(0.5 MG) BY MOUTH TWICE DAILY AS NEEDED FOR ANXIETY 60 tablet 0  . amLODipine (NORVASC) 10 MG tablet TAKE 1 TABLET(10 MG) BY MOUTH AT BEDTIME (Patient taking differently: Take 10 mg by mouth at bedtime. ) 90 tablet 3  . aspirin EC 81 MG tablet Take 81 mg by mouth at bedtime.     . cholecalciferol (VITAMIN D3) 25 MCG (1000 UT) tablet  Take 2,000 Units by mouth daily.    . cloNIDine (CATAPRES) 0.1 MG tablet TAKE 1 TABLET BY MOUTH THREE TIMES DAILY AS NEEDED FOR HIGH BLOOD PRESSURE 90 tablet 3  . Cyanocobalamin (B-12) 2500 MCG TABS Take 2,500 mcg by mouth daily.    Marland Kitchen EPINEPHrine 0.3 mg/0.3 mL IJ SOAJ injection Inject 0.3 mg into the muscle as needed for anaphylaxis.     . fluticasone (FLONASE) 50 MCG/ACT nasal spray Place 2 sprays into both nostrils daily. 16 g 6  . hydrochlorothiazide (HYDRODIURIL) 25 MG tablet TAKE 1 TABLET(25 MG) BY MOUTH DAILY 90 tablet 2  . hydrOXYzine (ATARAX/VISTARIL) 25 MG tablet Take 0.5-1 tablets (12.5-25 mg total) by mouth every 8 (eight) hours as needed for itching. 20 tablet 0  . ipratropium (ATROVENT) 0.03 % nasal spray USE 2 SPRAYS IN EACH NOSTRIL EVERY 12 HOURS (Patient taking differently: Place 2 sprays into both nostrils See admin instructions. Instill 2 sprays into each nostril each morning ,may do a second dose later as needed for allergies) 30 mL 12  . LINZESS 145 MCG CAPS capsule TAKE 1 CAPSULE(145 MCG) BY MOUTH DAILY BEFORE BREAKFAST 30 capsule 3  . MAGNESIUM PO Take 330 mg by mouth daily.    . metoprolol succinate (TOPROL-XL) 100 MG 24 hr tablet Take 1 tablet (100 mg total) by mouth every evening. Take with or immediately following a meal. 90 tablet 3  . mometasone (ELOCON) 0.1 % cream Apply 1 application topically daily as needed (for irritation).     . montelukast (SINGULAIR) 10 MG tablet TAKE 1 TABLET(10 MG) BY MOUTH AT BEDTIME 30 tablet 3  . Omega-3 Fatty Acids (FISH OIL) 1000 MG CAPS Take 1,000 mg by mouth daily.    . pantoprazole (PROTONIX) 40 MG tablet TAKE 1 TABLET BY MOUTH TWICE DAILY. TAKE 30 MINUTES BEFORE MEALS 60 tablet 5  . potassium chloride SA (KLOR-CON) 20 MEQ tablet TAKE 1 TABLET(20 MEQ) BY MOUTH DAILY 30 tablet 5  . pravastatin (PRAVACHOL) 20 MG tablet Take 1 tablet (20 mg total) by mouth daily. 90 tablet 1  . tacrolimus (PROTOPIC) 0.1 % ointment Apply 1 application  topically daily as needed (for irritation).     . triamcinolone cream (KENALOG) 0.1 % APPLY EXTERNALLY TO THE AFFECTED AREA TWICE DAILY 45 g 0  . valsartan (DIOVAN) 320 MG tablet TAKE 1 TABLET BY MOUTH EVERY DAY 90 tablet 3  . vitamin C (ASCORBIC ACID) 250 MG tablet Take 500 mg by mouth daily.    . vitamin E 400 UNIT capsule Take 400 Units by mouth daily.     No current facility-administered medications on file prior to visit.   Allergies  Allergen Reactions  . Ciprofloxacin Shortness Of Breath   Social History   Socioeconomic History  . Marital status: Single    Spouse name: Not on file  . Number of children: 3  . Years of education:  Not on file  . Highest education level: Not on file  Occupational History  . Occupation: English as a second language teacher: [employer]  Tobacco Use  . Smoking status: Former Smoker    Packs/day: 0.25    Years: 35.00    Pack years: 8.75    Types: Cigarettes    Quit date: 12/31/2012    Years since quitting: 6.6  . Smokeless tobacco: Never Used  Substance and Sexual Activity  . Alcohol use: Not Currently    Comment: occasionaly  . Drug use: No  . Sexual activity: Not Currently    Partners: Male    Birth control/protection: Post-menopausal, Surgical    Comment: tubal  Other Topics Concern  . Not on file  Social History Narrative   Eats all food groups.    Wears seatbelt.    Has 3 children.   Prior smoker.   Attends church.    Used to work in [employer] fund[employer].    Drives.   Enjoys going out with family.    Social Determinants of Health   Financial Resource Strain:   . Difficulty of Paying Living Expenses:   Food Insecurity:   . Worried About Charity fundraiser in the Last Year:   . Arboriculturist in the Last Year:   Transportation Needs:   . Film/video editor (Medical):   Marland Kitchen Lack of Transportation (Non-Medical):   Physical Activity:   . Days of Exercise per Week:   . Minutes of Exercise per Session:   Stress:   . Feeling of Stress  :   Social Connections:   . Frequency of Communication with Friends and Family:   . Frequency of Social Gatherings with Friends and Family:   . Attends Religious Services:   . Active Member of Clubs or Organizations:   . Attends Archivist Meetings:   Marland Kitchen Marital Status:   Intimate Partner Violence:   . Fear of Current or Ex-Partner:   . Emotionally Abused:   Marland Kitchen Physically Abused:   . Sexually Abused:    Family History  Problem Relation Age of Onset  . Diabetes Other   . Diabetes Mother   . Heart disease Mother   . Heart disease Father   . Diabetes Sister   . Diabetes Brother   . Hypertension Brother   . Diabetes Daughter   . Hypertension Maternal Grandmother   . Stroke Maternal Grandfather   . Early death Paternal Grandfather   . Colon cancer Neg Hx   . Liver disease Neg Hx       Review of Systems  All other systems reviewed and are negative.      Objective:   Physical Exam Vitals reviewed.  Constitutional:      Appearance: She is well-developed.  Cardiovascular:     Rate and Rhythm: Normal rate and regular rhythm.     Heart sounds: Normal heart sounds. No murmur. No friction rub. No gallop.   Pulmonary:     Effort: Pulmonary effort is normal. No respiratory distress.     Breath sounds: Normal breath sounds. No stridor. No wheezing or rales.  Chest:     Chest wall: No tenderness.  Musculoskeletal:     Right hand: Normal. No swelling, deformity, tenderness or bony tenderness. Normal range of motion.     Left hand: Normal. No swelling, deformity, tenderness or bony tenderness. Normal range of motion.  Neurological:     Motor: No abnormal muscle tone.  Assessment & Plan:  Benign essential HTN  Anxiety  Trigger finger of right hand, unspecified finger  Patient's blood pressure today is outstanding.  Therefore I hesitate to make any changes in her antihypertensive medication.  I do believe that the rapid upswing in her blood pressure  likely has a trigger.  I doubt pheochromocytoma but I do suspect anxiety.  Therefore we will experiment over the next week.  Instead of using clonidine, I would like to see if her blood pressure responds rapidly to Xanax.  If she finds that her blood pressure responds quickly to Xanax, we will focus on possibly preventing the panic attacks in the first place by starting her on an SSRI.  I offered the patient a cortisone injection in her hand for the trigger finger but she politely declined.  Patient will recheck with me via telephone in 1 week to let me know how her blood pressure response to the Xanax.

## 2019-08-22 NOTE — Progress Notes (Signed)
Referring Provider: Susy Frizzle, MD Primary Care Physician:  Susy Frizzle, MD Primary GI Physician: Dr. Gala Romney  Chief Complaint  Patient presents with  . Gastroesophageal Reflux    nausea every morning but no vomiting  . Abdominal Pain    RUQ and has a tender knot that can move around    HPI:   [name] is a 64 y.o. female with history of constipation, GERD, and dysphagia s/p dilation in 2019.  EGD at that time revealed mild Schatzki ring s/p dilation and mild erosive gastropathy.  Colonoscopy up-to-date and due for repeat in 2022.  She presents today for follow-up.  Last seen in our office on 05/16/2019.  GERD symptoms were well controlled on Protonix twice daily, dysphagia symptoms were rare and occur less than once a month, constipation well controlled on Linzess 145 mcg.  Discussed possibility of repeat esophageal dilation but patient preferred to continue to monitor as symptoms were rare.  She was to continue her current medications and follow-up in 6 months.  Tested positive for COVID-19 on 07/09/2019.  Today: Nausea daily last week.  Nausea had initially improved with increasing Protonix to twice daily. This week, nausea has been somewhat better and has only occurred once. No vomiting. No specific time of the day. Started zinc about 1 month ago. Trying to take this with a snack. No other medication changes. Nausea isn't triggered by meals. Has a knot in her RUQ that moves around that is tender. Has been present for more than 6 months. No abdominal pain with nausea. Eats 2 meals a day. This is her normal. No appetite changes or decreased appetite. Weight fluctuates but is fairly stable. Feels she gets full quickly. Only eats a small portion and gets full. Present for more than 1 year.   Some burning sensation in the epigastric area about twice a week. Hasn't worsened. Not associated with nauseated. BMs daily with Linzess. No blood or black stools. No NSAIDs.   No  fried foods. Rare fast foods. No spicy foods. No soda. Occasional red meats. Mostly eating baked fish and chicken. No changes in what she ate last week. Doesn't eat within 3 hours of laying down.  Eats a lot of vegetables and fruits.   No dysphagia.   Documented delayed gastric emptying back in 2004.   Past Medical History:  Diagnosis Date  . Abdominal aortic aneurysm (AAA) 3.0 cm to 5.0 cm in diameter in female [facility])    None seen on prior imaging?   Marland Kitchen Anxiety   . Cataract   . Chronic left shoulder pain   . Headache   . Hiatal hernia    small  . Hyperlipidemia   . Hypertension   . Pre-diabetes   . Schatzki's ring   . Tremor     Past Surgical History:  Procedure Laterality Date  . CARDIAC CATHETERIZATION  2005   "Ms. Subasic has essentially normal coronary arteries and normal left ventricular function.  She will be treated empirically with anti-reflux measures"  . CATARACT EXTRACTION, BILATERAL    . CHOLECYSTECTOMY    . COLONOSCOPY    06/16/2004   [id number] rectum/Diminutive polyps of the splenic flexure and the cecum/The remainder of the colonic mucosa appeared normal pathology (hyperplastic polyps).  . COLONOSCOPY N/A 08/07/2015   Dr. Gala Romney: 6 mm descending colon tubular adenoma, multiple medium-mouthed diverticula in sigmoid colon. surveillance 2022  . ESOPHAGOGASTRODUODENOSCOPY  09/11/2001   [id number] ring otherwise normal esophagus/Normal stomach/Normal D1 and  D2 status post passage of a 30 [place] Maloney dilator  . ESOPHAGOGASTRODUODENOSCOPY N/A 11/11/2017   Procedure: ESOPHAGOGASTRODUODENOSCOPY (EGD);  Surgeon: Daneil Dolin, MD;  [facility];  Service: Endoscopy;  Laterality: N/A;  8:15am  . ESOPHAGOGASTRODUODENOSCOPY (EGD) WITH ESOPHAGEAL DILATION N/A 01/25/2013   Dr. Gala Romney- schatzki's ring- 71 F dilation. small hiatal hernia  . HYSTEROSCOPY WITH D & C N/A 03/06/2019   Procedure: DILATATION AND CURETTAGE /HYSTEROSCOPY;  Surgeon: Jonnie Kind, MD;   Location: [facility];  Service: Gynecology;  Laterality: N/A;  . [name] DILATION N/A 11/11/2017   Procedure: [name] DILATION;  Surgeon: Daneil Dolin, MD;  [facility];  Service: Endoscopy;  Laterality: N/A;  . POLYPECTOMY N/A 03/06/2019   Procedure: POLYPECTOMY ( REMOVAL OF ENDOMETRIAL POLYP);  Surgeon: Jonnie Kind, MD;  Location: [facility];  Service: Gynecology;  Laterality: N/A;  . TUBAL LIGATION      Current Outpatient Medications  Medication Sig Dispense Refill  . acetaminophen (TYLENOL) 500 MG tablet Take 500 mg by mouth every 6 (six) hours as needed for moderate pain or headache.    . ALPRAZolam (XANAX) 0.5 MG tablet TAKE 1 TABLET(0.5 MG) BY MOUTH TWICE DAILY AS NEEDED FOR ANXIETY 60 tablet 0  . amLODipine (NORVASC) 10 MG tablet TAKE 1 TABLET(10 MG) BY MOUTH AT BEDTIME (Patient taking differently: Take 10 mg by mouth at bedtime. ) 90 tablet 3  . aspirin EC 81 MG tablet Take 81 mg by mouth at bedtime.     . cholecalciferol (VITAMIN D3) 25 MCG (1000 UT) tablet Take 2,000 Units by mouth daily.    . cloNIDine (CATAPRES) 0.1 MG tablet TAKE 1 TABLET BY MOUTH THREE TIMES DAILY AS NEEDED FOR HIGH BLOOD PRESSURE 90 tablet 3  . Cyanocobalamin (B-12) 2500 MCG TABS Take 2,500 mcg by mouth daily.    Marland Kitchen EPINEPHrine 0.3 mg/0.3 mL IJ SOAJ injection Inject 0.3 mg into the muscle as needed for anaphylaxis.     . fluticasone (FLONASE) 50 MCG/ACT nasal spray Place 2 sprays into both nostrils daily. 16 g 6  . hydrochlorothiazide (HYDRODIURIL) 25 MG tablet TAKE 1 TABLET(25 MG) BY MOUTH DAILY 90 tablet 2  . hydrOXYzine (ATARAX/VISTARIL) 25 MG tablet Take 0.5-1 tablets (12.5-25 mg total) by mouth every 8 (eight) hours as needed for itching. 20 tablet 0  . ipratropium (ATROVENT) 0.03 % nasal spray USE 2 SPRAYS IN EACH NOSTRIL EVERY 12 HOURS (Patient taking differently: Place 2 sprays into both nostrils See admin instructions. Instill 2 sprays into each nostril each morning ,may do a second dose later  as needed for allergies) 30 mL 12  . LINZESS 145 MCG CAPS capsule TAKE 1 CAPSULE(145 MCG) BY MOUTH DAILY BEFORE BREAKFAST 30 capsule 3  . MAGNESIUM PO Take 330 mg by mouth daily.    . metoprolol succinate (TOPROL-XL) 100 MG 24 hr tablet Take 1 tablet (100 mg total) by mouth every evening. Take with or immediately following a meal. 90 tablet 3  . mometasone (ELOCON) 0.1 % cream Apply 1 application topically daily as needed (for irritation).     . montelukast (SINGULAIR) 10 MG tablet TAKE 1 TABLET(10 MG) BY MOUTH AT BEDTIME 30 tablet 3  . Omega-3 Fatty Acids (FISH OIL) 1000 MG CAPS Take 1,000 mg by mouth daily.    . pantoprazole (PROTONIX) 40 MG tablet TAKE 1 TABLET BY MOUTH TWICE DAILY. TAKE 30 MINUTES BEFORE MEALS 60 tablet 5  . potassium chloride SA (KLOR-CON) 20 MEQ tablet TAKE  1 TABLET(20 MEQ) BY MOUTH DAILY 30 tablet 5  . pravastatin (PRAVACHOL) 20 MG tablet Take 1 tablet (20 mg total) by mouth daily. 90 tablet 1  . tacrolimus (PROTOPIC) 0.1 % ointment Apply 1 application topically daily as needed (for irritation).     . triamcinolone cream (KENALOG) 0.1 % APPLY EXTERNALLY TO THE AFFECTED AREA TWICE DAILY 45 g 0  . valsartan (DIOVAN) 320 MG tablet TAKE 1 TABLET BY MOUTH EVERY DAY 90 tablet 3  . vitamin C (ASCORBIC ACID) 250 MG tablet Take 500 mg by mouth daily.    . vitamin E 400 UNIT capsule Take 400 Units by mouth daily.    . ondansetron (ZOFRAN) 4 MG tablet Take 1 tablet (4 mg total) by mouth every 8 (eight) hours as needed for nausea or vomiting. 30 tablet 1   No current facility-administered medications for this visit.    Allergies as of 08/23/2019 - Review Complete 08/23/2019  Allergen Reaction Noted  . Ciprofloxacin Shortness Of Breath 12/27/2011    Family History  Problem Relation Age of Onset  . Diabetes Other   . Diabetes Mother   . Heart disease Mother   . Heart disease Father   . Diabetes Sister   . Diabetes Brother   . Hypertension Brother   . Diabetes Daughter   .  Hypertension Maternal Grandmother   . Stroke Maternal Grandfather   . Early death Paternal Grandfather   . Colon cancer Neg Hx   . Liver disease Neg Hx     Social History   Socioeconomic History  . Marital status: Single    Spouse name: Not on file  . Number of children: 3  . Years of education: Not on file  . Highest education level: Not on file  Occupational History  . Occupation: English as a second language teacher: [employer]  Tobacco Use  . Smoking status: Former Smoker    Packs/day: 0.25    Years: 35.00    Pack years: 8.75    Types: Cigarettes    Quit date: 12/31/2012    Years since quitting: 6.6  . Smokeless tobacco: Never Used  Substance and Sexual Activity  . Alcohol use: Not Currently    Comment: occasionaly  . Drug use: No  . Sexual activity: Not Currently    Partners: Male    Birth control/protection: Post-menopausal, Surgical    Comment: tubal  Other Topics Concern  . Not on file  Social History Narrative   Eats all food groups.    Wears seatbelt.    Has 3 children.   Prior smoker.   Attends church.    Used to work in Char[employer] fundraiser.    Drives.   Enjoys going out with family.    Social Determinants of Health   Financial Resource Strain:   . Difficulty of Paying Living Expenses:   Food Insecurity:   . Worried About Charity fundraiser in the Last Year:   . Arboriculturist in the Last Year:   Transportation Needs:   . Film/video editor (Medical):   Marland Kitchen Lack of Transportation (Non-Medical):   Physical Activity:   . Days of Exercise per Week:   . Minutes of Exercise per Session:   Stress:   . Feeling of Stress :   Social Connections:   . Frequency of Communication with Friends and Family:   . Frequency of Social Gatherings with Friends and Family:   . Attends Religious Services:   . Active Member of  Clubs or Organizations:   . Attends Archivist Meetings:   Marland Kitchen Marital Status:     Review of Systems: Gen: Denies fever, chills,  lightheadedness, dizziness, presyncope, syncope. CV: Denies chest pain or heart palpitations Resp: Denies dyspnea at rest or cough. GI: See HPI Derm: Denies rash Heme: See HPI  Physical Exam: BP (!) 160/74   Pulse 67   Temp (!) 97 F (36.1 C) (Oral)   Ht 5\' 7"  (1.702 m)   Wt 177 lb 12.8 oz (80.6 kg)   BMI 27.85 kg/m  General:   Alert and oriented. No distress noted. Pleasant and cooperative.  Head:  Normocephalic and atraumatic. Eyes:  Conjuctiva clear without scleral icterus. Heart:  S1, S2 present without murmurs appreciated. Lungs:  Clear to auscultation bilaterally. No wheezes, rales, or rhonchi. No distress.  Abdomen:  +BS, soft, and non-distended. Mild tenderness in epigastric area. Unable to appreciate any mass in RUQ. No rebound or guarding. No HSM noted.  Msk:  Symmetrical without gross deformities. Normal posture. Extremities:  Without edema. Neurologic:  Alert and  oriented x4 Psych:  Normal mood and affect.

## 2019-08-23 ENCOUNTER — Other Ambulatory Visit: Payer: Self-pay

## 2019-08-23 ENCOUNTER — Encounter: Payer: Self-pay | Admitting: Gastroenterology

## 2019-08-23 ENCOUNTER — Encounter: Payer: Self-pay | Admitting: Internal Medicine

## 2019-08-23 ENCOUNTER — Ambulatory Visit: Payer: Medicare Other | Admitting: Gastroenterology

## 2019-08-23 ENCOUNTER — Telehealth: Payer: Self-pay

## 2019-08-23 VITALS — BP 160/74 | HR 67 | Temp 97.0°F | Ht 67.0 in | Wt 177.8 lb

## 2019-08-23 DIAGNOSIS — R110 Nausea: Secondary | ICD-10-CM | POA: Diagnosis not present

## 2019-08-23 DIAGNOSIS — K5900 Constipation, unspecified: Secondary | ICD-10-CM | POA: Diagnosis not present

## 2019-08-23 DIAGNOSIS — R6881 Early satiety: Secondary | ICD-10-CM

## 2019-08-23 DIAGNOSIS — K219 Gastro-esophageal reflux disease without esophagitis: Secondary | ICD-10-CM

## 2019-08-23 DIAGNOSIS — R1011 Right upper quadrant pain: Secondary | ICD-10-CM | POA: Diagnosis not present

## 2019-08-23 MED ORDER — ONDANSETRON HCL 4 MG PO TABS: 4.0000 mg | ORAL_TABLET | Freq: Three times a day (TID) | ORAL | 1 refills | Status: DC | PRN

## 2019-08-23 NOTE — Patient Instructions (Signed)
We will arrange for her to have a gastric emptying study completed to evaluate your nausea and early satiety.  We will also arrange for you to have a ultrasound to evaluate the tender knot in your right upper quadrant.  Stop Protonix and try Dexilant 60 mg daily.  Will provide samples today.  Call with progress report in 1-2 weeks.  If this helps control your nausea well, I will send in a prescription.  I am also sending in Zofran 4 mg to use every 8 hours as needed for nausea.  Continue Linzess 145 mcg daily.  Further recommendations to follow imaging studies.  We will see you back in 3 months.  [name], PA-C [facility]

## 2019-08-23 NOTE — Assessment & Plan Note (Signed)
Well-controlled on Linzess 145 mcg daily.  No alarm symptoms.  Colonoscopy up-to-date in 2017 and due for repeat in 2022.  Continue Linzess 145 mcg daily. Follow-up in 3 months for nausea and early satiety.

## 2019-08-23 NOTE — Assessment & Plan Note (Addendum)
Typical GERD symptoms are well controlled on Protonix 40 mg twice daily.  Unfortunately, she has had return of intermittent nausea without vomiting.  Nausea was present daily last week but has only occurred once this week.  Denies eating fried, fatty, greasy, spicy foods.  No soda.  Previously, nausea had improved with increasing Protonix to twice daily.  Although her GERD symptoms are well controlled, we will stop Protonix and trial Dexilant 60 mg daily as her nausea had previously responded well to adjusting her PPI. Samples were provided today.  She was advised to call in 1-2 weeks with a progress report.  If this works well, I will send in a prescription. Further evaluation of nausea as discussed below. Follow-up in 3 months.

## 2019-08-23 NOTE — Assessment & Plan Note (Signed)
Patient reports return of intermittent nausea without vomiting last week.  Nausea was occurring daily last week but seems to have improved and is only occurred once this week.  Historically, increasing her Protonix to twice daily had significantly improved her nausea but she feels it is not working as well anymore.  She denies any typical GERD symptoms.  Admits to occasional burning sensation in the epigastric area about twice a week which is chronic and unchanged and early satiety which has been present for more than a year.  Also reports vague "knot" in her right upper quadrant that is tender and moves when she presses on it.  No abdominal pain associated with nausea.  No association with meals.  Nausea has not caused any significant change in appetite.  Weight is fairly stable.  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  Last EGD in 2019 with mild erosive gastropathy.  Denies NSAIDs.  Abdominal exam with mild tenderness palpation in the epigastric area.  No mass or tenderness appreciated in the right upper quadrant.  Suspect nausea may be related to delayed gastric emptying.  She may also have influence from gastritis.   As her nausea had previously improved with adjusting her PPI, we will stop Protonix and try Dexilant 60 mg daily.  Samples were provided today.  Requested she call with a progress report in 1-2 weeks, if this works well, we will send in a prescription. We will also update gastric emptying study. RUQ abdominal ultrasound to evaluate the "knot" that patient is reporting. Zofran 4 mg every 8 hours as needed for nausea. Further recommendations to follow imaging studies. Follow-up in 3 months.

## 2019-08-23 NOTE — Assessment & Plan Note (Signed)
Patient reports a "knot" in her RUQ that moves around and is tender.  Present for at least 6 months.  Knot reduces/goes away when she presses on it.  No radiation of pain. No appreciable mass or tenderness in the right upper quadrant on exam today. We will proceed with right upper quadrant ultrasound to evaluate this further.

## 2019-08-23 NOTE — Telephone Encounter (Signed)
[place] [facility] called office and LMOVM, GES needs PA. [phone], ext [phone])  PA for GES submitted via [facility] website. Case approved. PA# [id number], valid 08/23/19-10/07/19.  LMOVM to inform Malin of [place].

## 2019-08-24 NOTE — Progress Notes (Signed)
Cc'ed to pcp °

## 2019-08-27 ENCOUNTER — Encounter (HOSPITAL_COMMUNITY): Payer: Medicare Other

## 2019-08-30 ENCOUNTER — Ambulatory Visit (HOSPITAL_COMMUNITY)
Admission: RE | Admit: 2019-08-30 | Discharge: 2019-08-30 | Disposition: A | Discharge status: Home / Self Care | Payer: Medicare Other | Source: Ambulatory Visit | Attending: Gastroenterology | Admitting: Gastroenterology

## 2019-08-30 ENCOUNTER — Other Ambulatory Visit: Payer: Self-pay

## 2019-08-30 DIAGNOSIS — N281 Cyst of kidney, acquired: Secondary | ICD-10-CM | POA: Diagnosis not present

## 2019-08-30 DIAGNOSIS — R1011 Right upper quadrant pain: Secondary | ICD-10-CM | POA: Diagnosis not present

## 2019-09-02 NOTE — Progress Notes (Signed)
No acute findings on RUQ [place] to explain nausea or the "knot" she feels in her RUQ. Gallbladder is surgically absent, bile ducts appear normal, liver appears normal. She does have a cyst on her right kidney that is stable.   Waiting on GES scheduled for 09/03/19.

## 2019-09-03 ENCOUNTER — Encounter (HOSPITAL_COMMUNITY)
Admission: RE | Admit: 2019-09-03 | Discharge: 2019-09-03 | Disposition: A | Discharge status: Home / Self Care | Payer: Medicare Other | Source: Ambulatory Visit | Attending: Gastroenterology | Admitting: Gastroenterology

## 2019-09-03 ENCOUNTER — Other Ambulatory Visit: Payer: Self-pay

## 2019-09-03 ENCOUNTER — Encounter (HOSPITAL_COMMUNITY): Payer: Self-pay

## 2019-09-03 DIAGNOSIS — R6881 Early satiety: Secondary | ICD-10-CM

## 2019-09-03 DIAGNOSIS — R110 Nausea: Secondary | ICD-10-CM

## 2019-09-03 MED ORDER — TECHNETIUM TC 99M SULFUR COLLOID
2.0000 | Freq: Once | INTRAVENOUS | Status: AC
  Administered 2019-09-03: 2 via ORAL

## 2019-09-03 NOTE — Progress Notes (Signed)
GES within normal limits. No delay in stomach emptying.   Please find out how her nausea is doing since stopping Protonix and starting Dexilant.

## 2019-09-07 ENCOUNTER — Telehealth: Payer: Self-pay | Admitting: Family Medicine

## 2019-09-07 NOTE — Telephone Encounter (Signed)
Pt called and states that you wanted her to call with an update. Xanax is working well and you had mentioned another kind of cholesterol medication. She would like that sent to her pharmacy.

## 2019-09-10 ENCOUNTER — Other Ambulatory Visit: Payer: Self-pay | Admitting: Family Medicine

## 2019-09-10 DIAGNOSIS — F419 Anxiety disorder, unspecified: Secondary | ICD-10-CM

## 2019-09-10 NOTE — Telephone Encounter (Signed)
If bp is better on xanax, I want to see her back to discuss starting an ssri.

## 2019-09-11 ENCOUNTER — Telehealth: Payer: Self-pay | Admitting: Family Medicine

## 2019-09-11 NOTE — Chronic Care Management (AMB) (Signed)
  Chronic Care Management   Note  09/11/2019 Name: [name] MRN: [id number] DOB: 1955/10/16  [name] is a 64 y.o. year old female who is a primary care patient of Susy Frizzle, MD. I reached out to [employer] by phone today in response to a referral sent by Ms. Casimiro Needle Boldman's PCP, Susy Frizzle, MD.   Ms. Berridge was given information about Chronic Care Management services today including:  1. CCM service includes personalized support from designated clinical staff supervised by her physician, including individualized plan of care and coordination with other care providers 2. 24/7 contact phone numbers for assistance for urgent and routine care needs. 3. Service will only be billed when office clinical staff spend 20 minutes or more in a month to coordinate care. 4. Only one practitioner may furnish and bill the service in a calendar month. 5. The patient may stop CCM services at any time (effective at the end of the month) by phone call to the office staff.   Patient agreed to services and verbal consent obtained.   Follow up plan:   [place]

## 2019-09-11 NOTE — Telephone Encounter (Signed)
Pt is requesting refill on Xanax   LOV: 08/16/2019  LRF:   07/30/2019

## 2019-09-11 NOTE — Telephone Encounter (Signed)
Pt called and apt made.

## 2019-09-13 ENCOUNTER — Other Ambulatory Visit: Payer: Self-pay

## 2019-09-13 ENCOUNTER — Ambulatory Visit (INDEPENDENT_AMBULATORY_CARE_PROVIDER_SITE_OTHER): Payer: Medicare Other | Admitting: Family Medicine

## 2019-09-13 VITALS — BP 162/80 | HR 66 | Temp 97.1°F | Resp 16 | Ht 67.0 in | Wt 178.0 lb

## 2019-09-13 DIAGNOSIS — E118 Type 2 diabetes mellitus with unspecified complications: Secondary | ICD-10-CM | POA: Diagnosis not present

## 2019-09-13 MED ORDER — ESCITALOPRAM OXALATE 10 MG PO TABS: 10.0000 mg | ORAL_TABLET | Freq: Every day | ORAL | 5 refills | Status: DC

## 2019-09-13 MED ORDER — ROSUVASTATIN CALCIUM 5 MG PO TABS: 5.0000 mg | ORAL_TABLET | Freq: Every day | ORAL | 3 refills | Status: DC

## 2019-09-13 NOTE — Progress Notes (Signed)
Subjective:    Patient ID: [name], female    DOB: [birth year], 64 y.o.   MRN: [id number]  HPI  4/21 Patient presents today with 2 issues.  First she states that her blood pressure is "shooting up randomly".  She states that the majority of the time her blood pressure is well controlled similar to the numbers she has today which is 128/72.  However at other times, her blood pressure will rise suddenly to [id number] systolic unprovoked for no reason even without activity.  She does report that anxiety does tend to cause this and the pain tends to cause it.  She denies any chest pain shortness of breath or dyspnea on exertion.  She denies any severe headaches to suggest a pheochromocytoma or tachycardia.  If she takes clonidine, her blood pressure will come back down rapidly however it leaves her feeling groggy and tired for the rest of the day and she does not like the way the medication makes her feel.  Second issue is a trigger finger in her right hand at the third MCP joint.  She states that that finger will lock whenever she flexes it particularly at night.  It will hurt and she will physically have to extended.  However is not doing today on exam.  There is no abnormality on her physical exam today.  At that ti me, my plan was: Patient's blood pressure today is outstanding.  Therefore I hesitate to make any changes in her antihypertensive medication.  I do believe that the rapid upswing in her blood pressure likely has a trigger.  I doubt pheochromocytoma but I do suspect anxiety.  Therefore we will experiment over the next week.  Instead of using clonidine, I would like to see if her blood pressure responds rapidly to Xanax.  If she finds that her blood pressure responds quickly to Xanax, we will focus on possibly preventing the panic attacks in the first place by starting her on an SSRI.  I offered the patient a cortisone injection in her hand for the trigger finger but she politely declined.   Patient will recheck with me via telephone in 1 week to let me know how her blood pressure response to the Xanax.  09/13/19 Patient's blood pressure has been much better controlled at home since she started Xanax.  She is using 1 tablet every 2 to 3 days and her blood pressure is remaining in the 110s to 123456 range systolic.  She is due for repeat hemoglobin A1c to monitor her diabetes.  She denies any polyuria, polydipsia, or blurry vision.  She discontinue pravastatin due to muscle aches however the muscle aches have not improved and she would like to try different statin. Past Medical History:  Diagnosis Date  . Abdominal aortic aneurysm (AAA) 3.0 cm to 5.0 cm in diameter in female [facility])    None seen on prior imaging?   Marland Kitchen Anxiety   . Cataract   . Chronic left shoulder pain   . Headache   . Hiatal hernia    small  . Hyperlipidemia   . Hypertension   . Pre-diabetes   . Schatzki's ring   . Tremor    Past Surgical History:  Procedure Laterality Date  . CARDIAC CATHETERIZATION  2005   "Ms. Schoenbauer has essentially normal coronary arteries and normal left ventricular function.  She will be treated empirically with anti-reflux measures"  . CATARACT EXTRACTION, BILATERAL    . CHOLECYSTECTOMY    . COLONOSCOPY  06/16/2004   [id number] rectum/Diminutive polyps of the splenic flexure and the cecum/The remainder of the colonic mucosa appeared normal pathology (hyperplastic polyps).  . COLONOSCOPY N/A 08/07/2015   Dr. Gala Romney: 6 mm descending colon tubular adenoma, multiple medium-mouthed diverticula in sigmoid colon. surveillance 2022  . ESOPHAGOGASTRODUODENOSCOPY  09/11/2001   [id number] ring otherwise normal esophagus/Normal stomach/Normal D1 and D2 status post passage of a 56 [place] dilator  . ESOPHAGOGASTRODUODENOSCOPY N/A 11/11/2017   Procedure: ESOPHAGOGASTRODUODENOSCOPY (EGD);  Surgeon: Daneil Dolin, MD;  Location: [facility];  Service: Endoscopy;  Laterality: N/A;  8:15am    . ESOPHAGOGASTRODUODENOSCOPY (EGD) WITH ESOPHAGEAL DILATION N/A 01/25/2013   Dr. Gala Romney- schatzki's ring- 27 F dilation. small hiatal hernia  . HYSTEROSCOPY WITH D & C N/A 03/06/2019   Procedure: DILATATION AND CURETTAGE /HYSTEROSCOPY;  Surgeon: Jonnie Kind, MD;  Location: [facility] ORS;  Service: Gynecology;  Laterality: N/A;  . [name] DILATION N/A 11/11/2017   Procedure: [name] DILATION;  Surgeon: Daneil Dolin, MD;  Location: [facility];  Service: Endoscopy;  Laterality: N/A;  . POLYPECTOMY N/A 03/06/2019   Procedure: POLYPECTOMY ( REMOVAL OF ENDOMETRIAL POLYP);  Surgeon: Jonnie Kind, MD;  Location: [facility] OR[facility];  Service: Gynecology;  Laterality: N/A;  . TUBAL LIGATION     Current Outpatient Medications on File Prior to Visit  Medication Sig Dispense Refill  . acetaminophen (TYLENOL) 500 MG tablet Take 500 mg by mouth every 6 (six) hours as needed for moderate pain or headache.    . ALPRAZolam (XANAX) 0.5 MG tablet TAKE 1 TABLET(0.5 MG) BY MOUTH TWICE DAILY AS NEEDED FOR ANXIETY 60 tablet 0  . amLODipine (NORVASC) 10 MG tablet TAKE 1 TABLET(10 MG) BY MOUTH AT BEDTIME (Patient taking differently: Take 10 mg by mouth at bedtime. ) 90 tablet 3  . aspirin EC 81 MG tablet Take 81 mg by mouth at bedtime.     . cholecalciferol (VITAMIN D3) 25 MCG (1000 UT) tablet Take 2,000 Units by mouth daily.    . Cyanocobalamin (B-12) 2500 MCG TABS Take 2,500 mcg by mouth daily.    Marland Kitchen EPINEPHrine 0.3 mg/0.3 mL IJ SOAJ injection Inject 0.3 mg into the muscle as needed for anaphylaxis.     . fluticasone (FLONASE) 50 MCG/ACT nasal spray Place 2 sprays into both nostrils daily. 16 g 6  . hydrochlorothiazide (HYDRODIURIL) 25 MG tablet TAKE 1 TABLET(25 MG) BY MOUTH DAILY 90 tablet 2  . hydrOXYzine (ATARAX/VISTARIL) 25 MG tablet Take 0.5-1 tablets (12.5-25 mg total) by mouth every 8 (eight) hours as needed for itching. 20 tablet 0  . ipratropium (ATROVENT) 0.03 % nasal spray USE 2 SPRAYS IN EACH NOSTRIL EVERY 12  HOURS (Patient taking differently: Place 2 sprays into both nostrils See admin instructions. Instill 2 sprays into each nostril each morning ,may do a second dose later as needed for allergies) 30 mL 12  . LINZESS 145 MCG CAPS capsule TAKE 1 CAPSULE(145 MCG) BY MOUTH DAILY BEFORE BREAKFAST 30 capsule 3  . MAGNESIUM PO Take 330 mg by mouth daily.    . metoprolol succinate (TOPROL-XL) 100 MG 24 hr tablet Take 1 tablet (100 mg total) by mouth every evening. Take with or immediately following a meal. 90 tablet 3  . mometasone (ELOCON) 0.1 % cream Apply 1 application topically daily as needed (for irritation).     . montelukast (SINGULAIR) 10 MG tablet TAKE 1 TABLET(10 MG) BY MOUTH AT BEDTIME 30 tablet 3  . Omega-3 Fatty Acids (FISH OIL) 1000  MG CAPS Take 1,000 mg by mouth daily.    . ondansetron (ZOFRAN) 4 MG tablet Take 1 tablet (4 mg total) by mouth every 8 (eight) hours as needed for nausea or vomiting. 30 tablet 1  . pantoprazole (PROTONIX) 40 MG tablet TAKE 1 TABLET BY MOUTH TWICE DAILY. TAKE 30 MINUTES BEFORE MEALS 60 tablet 5  . potassium chloride SA (KLOR-CON) 20 MEQ tablet TAKE 1 TABLET(20 MEQ) BY MOUTH DAILY 30 tablet 5  . pravastatin (PRAVACHOL) 20 MG tablet Take 1 tablet (20 mg total) by mouth daily. 90 tablet 1  . tacrolimus (PROTOPIC) 0.1 % ointment Apply 1 application topically daily as needed (for irritation).     . triamcinolone cream (KENALOG) 0.1 % APPLY EXTERNALLY TO THE AFFECTED AREA TWICE DAILY 45 g 0  . valsartan (DIOVAN) 320 MG tablet TAKE 1 TABLET BY MOUTH EVERY DAY 90 tablet 3  . vitamin C (ASCORBIC ACID) 250 MG tablet Take 500 mg by mouth daily.    . vitamin E 400 UNIT capsule Take 400 Units by mouth daily.    . cloNIDine (CATAPRES) 0.1 MG tablet TAKE 1 TABLET BY MOUTH THREE TIMES DAILY AS NEEDED FOR HIGH BLOOD PRESSURE (Patient not taking: Reported on 09/13/2019) 90 tablet 3   No current facility-administered medications on file prior to visit.   Allergies  Allergen  Reactions  . Ciprofloxacin Shortness Of Breath   Social History   Socioeconomic History  . Marital status: Single    Spouse name: Not on file  . Number of children: 3  . Years of education: Not on file  . Highest education level: Not on file  Occupational History  . Occupation: English as a second language teacher: [employer]  Tobacco Use  . Smoking status: Former Smoker    Packs/day: 0.25    Years: 35.00    Pack years: 8.75    Types: Cigarettes    Quit date: 12/31/2012    Years since quitting: 6.7  . Smokeless tobacco: Never Used  Substance and Sexual Activity  . Alcohol use: Not Currently    Comment: occasionaly  . Drug use: No  . Sexual activity: Not Currently    Partners: Male    Birth control/protection: Post-menopausal, Surgical    Comment: tubal  Other Topics Concern  . Not on file  Social History Narrative   Eats all food groups.    Wears seatbelt.    Has 3 children.   Prior smoker.   Attends church.    Used to work in Charity fundraiser.    Drives.   Enjoys going out with family.    Social Determinants of Health   Financial Resource Strain:   . Difficulty of Paying Living Expenses:   Food Insecurity:   . Worried About Charity fundraiser in the Last Year:   . Arboriculturist in the Last Year:   Transportation Needs:   . Film/video editor (Medical):   Marland Kitchen Lack of Transportation (Non-Medical):   Physical Activity:   . Days of Exercise per Week:   . Minutes of Exercise per Session:   Stress:   . Feeling of Stress :   Social Connections:   . Frequency of Communication with Friends and Family:   . Frequency of Social Gatherings with Friends and Family:   . Attends Religious Services:   . Active Member of Clubs or Organizations:   . Attends Archivist Meetings:   Marland Kitchen Marital Status:   Intimate Partner Violence:   .  Fear of Current or Ex-Partner:   . Emotionally Abused:   Marland Kitchen Physically Abused:   . Sexually Abused:    Family History  Problem  Relation Age of Onset  . Diabetes Other   . Diabetes Mother   . Heart disease Mother   . Heart disease Father   . Diabetes Sister   . Diabetes Brother   . Hypertension Brother   . Diabetes Daughter   . Hypertension Maternal Grandmother   . Stroke Maternal Grandfather   . Early death Paternal Grandfather   . Colon cancer Neg Hx   . Liver disease Neg Hx       Review of Systems  All other systems reviewed and are negative.      Objective:   Physical Exam Vitals reviewed.  Constitutional:      Appearance: She is well-developed.  Cardiovascular:     Rate and Rhythm: Normal rate and regular rhythm.     Heart sounds: Normal heart sounds. No murmur. No friction rub. No gallop.   Pulmonary:     Effort: Pulmonary effort is normal. No respiratory distress.     Breath sounds: Normal breath sounds. No stridor. No wheezing or rales.  Chest:     Chest wall: No tenderness.  Musculoskeletal:     Right hand: Normal. No swelling, deformity, tenderness or bony tenderness. Normal range of motion.     Left hand: Normal. No swelling, deformity, tenderness or bony tenderness. Normal range of motion.  Neurological:     Motor: No abnormal muscle tone.           Assessment & Plan:  Controlled diabetes mellitus type 2 with complications, unspecified whether long term insulin use (HCC) - Plan: Hemoglobin A1c, Microalbumin, urine  I believe her anxiety contributes to her blood pressure.  Rather than use Xanax constantly, I would recommend trying Lexapro 10 mg a day to help better manage her anxiety.  Goal blood pressures less than 140/90.  Check a hemoglobin A1c and urine microalbumin.  Start Crestor 5 mg a day and recheck a fasting lipid panel.  Limit use of Xanax as much as possible.

## 2019-09-14 LAB — HEMOGLOBIN A1C
Hgb A1c MFr Bld: 5.8 % of total Hgb — ABNORMAL HIGH (ref <5.7)
Mean Plasma Glucose: 120 (calc)
eAG (mmol/L): 6.6 (calc)

## 2019-09-14 LAB — MICROALBUMIN, URINE: Microalb, Ur: 0.2 mg/dL

## 2019-09-17 ENCOUNTER — Other Ambulatory Visit: Payer: Self-pay | Admitting: Family Medicine

## 2019-09-17 DIAGNOSIS — E118 Type 2 diabetes mellitus with unspecified complications: Secondary | ICD-10-CM

## 2019-09-17 DIAGNOSIS — E785 Hyperlipidemia, unspecified: Secondary | ICD-10-CM

## 2019-09-17 DIAGNOSIS — I10 Essential (primary) hypertension: Secondary | ICD-10-CM

## 2019-09-20 NOTE — Chronic Care Management (AMB) (Addendum)
Chronic Care Management Pharmacy  Name: [name]  MRN: [id number] DOB: March 06, 1956  Chief Complaint/ HPI  [name],  64 y.o. , female presents for their Initial CCM visit with the clinical pharmacist In office.  PCP : Susy Frizzle, MD  Their chronic conditions include: hypertension, GERD, Type II DM, Hyperlipidemia.  Office Visits:  09/13/2019 ([facility]) - BP much more controlled with Xanax, using one every 2 to 3 days, BP now 123456 systolic, d/c statin due to muscle pain but did not improve interested in new statin, starting Lexapro to prevent anxiety rather than using xanax, also starting Crestor 5mg  daily  08/16/2019 ([facility]) - reports blood pressure randomly spiking for no apparent reason, suspected due to anxiety told to check how BP responds to xanax  07/31/2019 ([facility]) - appt to recheck liver function due to starting new statin, liver function came back normal  Consult Visit:  08/23/2019 Jodi Mourning, gastro) - Nausea without vomiting, she stopped Protonix and started Dexilant samples, zofran q8h, continue Linzess  Medications: Outpatient Encounter Medications as of 09/21/2019  Medication Sig   acetaminophen (TYLENOL) 500 MG tablet Take 500 mg by mouth every 6 (six) hours as needed for moderate pain or headache.   ALPRAZolam (XANAX) 0.5 MG tablet TAKE 1 TABLET(0.5 MG) BY MOUTH TWICE DAILY AS NEEDED FOR ANXIETY   amLODipine (NORVASC) 10 MG tablet TAKE 1 TABLET(10 MG) BY MOUTH AT BEDTIME (Patient taking differently: Take 10 mg by mouth at bedtime. )   aspirin EC 81 MG tablet Take 81 mg by mouth at bedtime.    cholecalciferol (VITAMIN D3) 25 MCG (1000 UT) tablet Take 2,000 Units by mouth daily.   cloNIDine (CATAPRES) 0.1 MG tablet TAKE 1 TABLET BY MOUTH THREE TIMES DAILY AS NEEDED FOR HIGH BLOOD PRESSURE   Cyanocobalamin (B-12) 2500 MCG TABS Take 2,500 mcg by mouth daily.   EPINEPHrine 0.3 mg/0.3 mL IJ SOAJ injection Inject 0.3 mg into the muscle as  needed for anaphylaxis.    escitalopram (LEXAPRO) 10 MG tablet Take 1 tablet (10 mg total) by mouth daily.   fluticasone (FLONASE) 50 MCG/ACT nasal spray Place 2 sprays into both nostrils daily.   hydrochlorothiazide (HYDRODIURIL) 25 MG tablet TAKE 1 TABLET(25 MG) BY MOUTH DAILY   hydrOXYzine (ATARAX/VISTARIL) 25 MG tablet Take 0.5-1 tablets (12.5-25 mg total) by mouth every 8 (eight) hours as needed for itching.   ipratropium (ATROVENT) 0.03 % nasal spray USE 2 SPRAYS IN EACH NOSTRIL EVERY 12 HOURS (Patient taking differently: Place 2 sprays into both nostrils See admin instructions. Instill 2 sprays into each nostril each morning ,may do a second dose later as needed for allergies)   LINZESS 145 MCG CAPS capsule TAKE 1 CAPSULE(145 MCG) BY MOUTH DAILY BEFORE BREAKFAST   MAGNESIUM PO Take 330 mg by mouth daily.   metoprolol succinate (TOPROL-XL) 100 MG 24 hr tablet Take 1 tablet (100 mg total) by mouth every evening. Take with or immediately following a meal.   mometasone (ELOCON) 0.1 % cream Apply 1 application topically daily as needed (for irritation).    montelukast (SINGULAIR) 10 MG tablet TAKE 1 TABLET(10 MG) BY MOUTH AT BEDTIME   Omega-3 Fatty Acids (FISH OIL) 1000 MG CAPS Take 1,000 mg by mouth daily.   ondansetron (ZOFRAN) 4 MG tablet Take 1 tablet (4 mg total) by mouth every 8 (eight) hours as needed for nausea or vomiting.   pantoprazole (PROTONIX) 40 MG tablet TAKE 1 TABLET BY MOUTH TWICE DAILY. TAKE 30 MINUTES BEFORE  MEALS   potassium chloride SA (KLOR-CON) 20 MEQ tablet TAKE 1 TABLET(20 MEQ) BY MOUTH DAILY   rosuvastatin (CRESTOR) 5 MG tablet Take 1 tablet (5 mg total) by mouth daily.   tacrolimus (PROTOPIC) 0.1 % ointment Apply 1 application topically daily as needed (for irritation).    triamcinolone cream (KENALOG) 0.1 % APPLY EXTERNALLY TO THE AFFECTED AREA TWICE DAILY   valsartan (DIOVAN) 320 MG tablet TAKE 1 TABLET BY MOUTH EVERY DAY   vitamin C (ASCORBIC ACID) 250 MG tablet  Take 500 mg by mouth daily.   vitamin E 400 UNIT capsule Take 400 Units by mouth daily.   pravastatin (PRAVACHOL) 20 MG tablet Take 1 tablet (20 mg total) by mouth daily. (Patient not taking: Reported on 09/21/2019)   No facility-administered encounter medications on file as of 09/21/2019.     Current Diagnosis/Assessment:   [name]: Low Risk    Difficulty of Paying Living Expenses: Not very hard     Goals Addressed             This Visit's Progress    CARE PLAN:       CARE PLAN ENTRY  Current Barriers:  Chronic Disease Management support, education, and care coordination needs related to Hypertension, Hyperlipidemia, and Diabetes   Hypertension Pharmacist Clinical Goal(s): Over the next 90 days, patient will work with PharmD and providers to achieve BP goal <140/90 Current regimen:  Valsartan 320mg  daily, metoprolol succinate 100mg  daily, HCTZ 25mg  daily, clonidine 0.1mg  as needed, and amlodipine 10mg  daily Interventions: Record blood pressure in a log so that we can go over at future appointments. Restart Lexapro to control anxiety to see if this stops some of the blood pressure spikes. Patient self care activities - Over the next 90 days, patient will: Check BP three times daily, document, and provide at future appointments Ensure daily salt intake < 2300 mg/day Restart Lexapro and contact PharmD or PCP if rash develops or wish to discontinue.  Hyperlipidemia Pharmacist Clinical Goal(s): Over the next 90 days, patient will work with PharmD and providers to achieve LDL goal < 70 Current regimen:  Rosuvastatin 5mg  daily Interventions: Continue current therapy. Due for lipid check to evaluate current therapy for modification if needed. Patient self care activities - Over the next 90 days, patient will: Continue to take medication as directed, notify PharmD or PCP with any side effects such as muscle pain. Come in for lipid recheck  appointment.  Diabetes Pharmacist Clinical Goal(s): Over the next 90 days, patient will work with PharmD and providers to maintain A1c goal <7% Current regimen:  No medications currently Interventions: Continue to control with diet/exercise. Patient self care activities - Over the next 90 days, patient will: Check blood sugar as usual, document, and provide at future appointments Contact provider with any episodes of hypoglycemia Continue to walk weekly as you currently are and make smart dietary choices.  Medication management Pharmacist Clinical Goal(s): Over the next 90 days, patient will work with PharmD and providers to maintain optimal medication adherence Current pharmacy: [facility] Interventions Comprehensive medication review performed. Utilize [facility] for medication synchronization, packaging and delivery Patient self care activities - Over the next 90 days, patient will: Focus on medication adherence by pill count and utilize [facility] packaging system once synchronized. Take medications as prescribed Report any questions or concerns to PharmD and/or provider(s)  Initial goal documentation         Diabetes   Recent Relevant Labs: Lab Results  Component Value Date/Time   HGBA1C 5.8 (H) 09/13/2019 02:39 PM   HGBA1C 6.6 (H) 03/02/2019 01:26 PM   MICROALBUR 0.2 09/13/2019 02:39 PM   MICROALBUR 0.3 06/27/2018 08:48 AM     Checking BG:  occasionally.   Patient has failed these meds in past: metformin 500mg  Patient is currently controlled on the following medications: none noted  Last diabetic Foot exam: No results found for: HMDIABEYEEXA  Last diabetic Eye exam: No results found for: HMDIABFOOTEX   Patient has been walking for ~ 2 months now and prefers to control this way.  Most recent A1c well controlled, patient encouraged to hear this.  Plan  Continue control with diet and exercise. Hypertension    Office blood  pressures are  BP Readings from Last 3 Encounters:  09/13/19 (!) 162/80  08/23/19 (!) 160/74  08/16/19 128/72    Patient has failed these meds in the past: losartan 100mg   Patient checks BP at home  three times per day  Patient home BP readings are ranging: 130s-150s/80-90s  Patient is currently uncontrolled on the following medications: valsartan 320mg  daily, metoprolol 100mg  daily, HCTZ 25mg  daily, clonidine 0.1mg , amlodipine 10mg    We discussed  recording BP in log so that we can go over at future appointments .  Patient was checking but not recording so was having to recall all numbers from memory.  Reported varying pressures from 130s all the way up to 170s.  Reports some signs of dizziness with metoprolol dose but was not taking with food.  Also only taking clonidine very occasionally for BP when it gets really high as she does not like how this makes her feel.  BP seems to be anxiety related, pt had stopped Lexapro d/t a rash but willing to retry to see if this helps the nerves.  Plan  Continue current medications.  Record BP in a log tid and bring to future appointments.  Restart lexapro to see if this controls BP due to anxiety.  Call PCP if rash develops or if BP consistently > 140/90.     Hyperlipidemia   Lipid Panel     Component Value Date/Time   CHOL 157 06/27/2018 0848   TRIG 49 06/27/2018 0848   HDL 66 06/27/2018 0848   CHOLHDL 2.4 06/27/2018 0848   LDLCALC 77 06/27/2018 0848     The 10-year ASCVD risk score Mikey Bussing DC Jr., et al., 2013) is: 26.8%   Values used to calculate the score:     Age: 17 years     Sex: Female     Is Non-Hispanic African American: Yes     Diabetic: Yes     Tobacco smoker: No     Systolic Blood Pressure: 0000000 mmHg     Is BP treated: Yes     HDL Cholesterol: 66 mg/dL     Total Cholesterol: 157 mg/dL   Patient has failed these meds in past: atorvastatin, pravastatin Patient is currently uncontrolled on the following medications:  rosuvastatin 5mg  nightly  No myalgias currently with new statin.  Patient due for lipid recheck.  Plan  Continue current medications.  Recheck lipids to determine level of control.  GERD    Patient has failed these meds in past: [place]  Patient is currently controlled on the following medications: pantoprazole 40mg  bid  We discussed:  Tried Dexilant samples but did not like how they made her feel, taking 40mg  bid with no more nausea and few symptoms of GERD.  Plan  Continue current  medications  Vaccines   Reviewed and discussed patient's vaccination history.    Immunization History  Administered Date(s) Administered   Influenza,inj,Quad PF,6+ Mos 02/05/2017, 02/23/2018, 02/20/2019   Pneumococcal Conjugate-13 07/26/2017   Tdap 03/28/2017    Plan  Recommended patient receive Shingrix vaccine in pharmacy.   Medication Management   Miscellaneous medications: montelukast 10mg , levocetirizine 5mg , alprazolam 0.5mg  prn, lexapro 10mg  [facility].   Would love to be synchronized and from the same pharmacy. Patient reports using pill box method to organize medications and promote adherence. Patient denies missed doses of medication.   Verbal consent obtained for [facility] enhanced pharmacy services (medication synchronization, adherence packaging, delivery coordination). A medication sync plan was created to allow patient to get all medications delivered once every 30 to 90 days per patient preference. Patient understands they have freedom to choose pharmacy and clinical pharmacist will coordinate care between all prescribers and [facility].    [name], PharmD Clinical Pharmacist [facility] [phone]  I have collaborated with the care management provider regarding care management and care coordination activities outlined in this encounter and have reviewed this encounter including  documentation in the note and care plan. I am certifying that I agree with the content of this note and encounter as supervising physician.

## 2019-09-21 ENCOUNTER — Ambulatory Visit: Payer: Medicare Other | Admitting: Pharmacist

## 2019-09-21 ENCOUNTER — Other Ambulatory Visit: Payer: Self-pay

## 2019-09-21 DIAGNOSIS — E118 Type 2 diabetes mellitus with unspecified complications: Secondary | ICD-10-CM

## 2019-09-21 DIAGNOSIS — I10 Essential (primary) hypertension: Secondary | ICD-10-CM

## 2019-09-21 DIAGNOSIS — E785 Hyperlipidemia, unspecified: Secondary | ICD-10-CM

## 2019-09-21 NOTE — Patient Instructions (Addendum)
Thank you for meeting with me today!  I look forward to working with you to help you meet all of your healthcare goals and answer any questions you may have.  Feel free to contact me anytime!   Visit Information  Goals Addressed            This Visit's Progress   . CARE PLAN:       CARE PLAN ENTRY  Current Barriers:  . Chronic Disease Management support, education, and care coordination needs related to Hypertension, Hyperlipidemia, and Diabetes   Hypertension . Pharmacist Clinical Goal(s): o Over the next 90 days, patient will work with PharmD and providers to achieve BP goal <140/90 . Current regimen:  o Valsartan 320mg  daily, metoprolol succinate 100mg  daily, HCTZ 25mg  daily, clonidine 0.1mg  as needed, and amlodipine 10mg  daily . Interventions: o Record blood pressure in a log so that we can go over at future appointments. o Restart Lexapro to control anxiety to see if this stops some of the blood pressure spikes. . Patient self care activities - Over the next 90 days, patient will: o Check BP three times daily, document, and provide at future appointments o Ensure daily salt intake < 2300 mg/day o Restart Lexapro and contact PharmD or PCP if rash develops or wish to discontinue.  Hyperlipidemia . Pharmacist Clinical Goal(s): o Over the next 90 days, patient will work with PharmD and providers to achieve LDL goal < 70 . Current regimen:  o Rosuvastatin 5mg  daily . Interventions: o Continue current therapy. o Due for lipid check to evaluate current therapy for modification if needed. . Patient self care activities - Over the next 90 days, patient will: o Continue to take medication as directed, notify PharmD or PCP with any side effects such as muscle pain. o Come in for lipid recheck appointment.  Diabetes . Pharmacist Clinical Goal(s): o Over the next 90 days, patient will work with PharmD and providers to maintain A1c goal <7% . Current regimen:  o No medications  currently . Interventions: o Continue to control with diet/exercise. . Patient self care activities - Over the next 90 days, patient will: o Check blood sugar as usual, document, and provide at future appointments o Contact provider with any episodes of hypoglycemia o Continue to walk weekly as you currently are and make smart dietary choices.  Medication management . Pharmacist Clinical Goal(s): o Over the next 90 days, patient will work with PharmD and providers to maintain optimal medication adherence . Current pharmacy: [facility] . Interventions o Comprehensive medication review performed. o Utilize [facility] for medication synchronization, packaging and delivery . Patient self care activities - Over the next 90 days, patient will: o Focus on medication adherence by pill count and utilize [employer] system once synchronized. o Take medications as prescribed o Report any questions or concerns to PharmD and/or provider(s)  Initial goal documentation        [name] was given information about Chronic Care Management services today including:  1. CCM service includes personalized support from designated clinical staff supervised by her physician, including individualized plan of care and coordination with other care providers 2. 24/7 contact phone numbers for assistance for urgent and routine care needs. 3. Standard insurance, coinsurance, copays and deductibles apply for chronic care management only during months in which we provide at least 20 minutes of these services. Most insurances cover these services at 100%, however patients may be responsible for any copay, coinsurance and/or deductible  if applicable. This service may help you avoid the need for more expensive face-to-face services. 4. Only one practitioner may furnish and bill the service in a calendar month. 5. The patient may stop CCM services at any time (effective at the end of  the month) by phone call to the office staff.  Patient agreed to services and verbal consent obtained.   The patient verbalized understanding of instructions provided today and agreed to receive a mailed copy of patient instruction and/or educational materials. Telephone follow up appointment with pharmacy team member scheduled for: 01/04/2020 @ 9:00 AM  [name], PharmD Clinical Pharmacist [facility] [phone]   Managing Your Hypertension Hypertension is commonly called high blood pressure. This is when the force of your blood pressing against the walls of your arteries is too strong. Arteries are blood vessels that carry blood from your heart throughout your body. Hypertension forces the heart to work harder to pump blood, and may cause the arteries to become narrow or stiff. Having untreated or uncontrolled hypertension can cause heart attack, stroke, kidney disease, and other problems. What are blood pressure readings? A blood pressure reading consists of a higher number over a lower number. Ideally, your blood pressure should be below 120/80. The first ("top") number is called the systolic pressure. It is a measure of the pressure in your arteries as your heart beats. The second ("bottom") number is called the diastolic pressure. It is a measure of the pressure in your arteries as the heart relaxes. What does my blood pressure reading mean? Blood pressure is classified into four stages. Based on your blood pressure reading, your health care provider may use the following stages to determine what type of treatment you need, if any. Systolic pressure and diastolic pressure are measured in a unit called mm Hg. Normal  Systolic pressure: below 123456.  Diastolic pressure: below 80. Elevated  Systolic pressure: [id number].  Diastolic pressure: below 80. Hypertension stage 1  Systolic pressure: 0000000.  Diastolic pressure: [id number]. Hypertension stage  2  Systolic pressure: [id number] or above.  Diastolic pressure: 90 or above. What health risks are associated with hypertension? Managing your hypertension is an important responsibility. Uncontrolled hypertension can lead to:  A heart attack.  A stroke.  A weakened blood vessel (aneurysm).  Heart failure.  Kidney damage.  Eye damage.  Metabolic syndrome.  Memory and concentration problems. What changes can I make to manage my hypertension? Hypertension can be managed by making lifestyle changes and possibly by taking medicines. Your health care provider will help you make a plan to bring your blood pressure within a normal range. Eating and drinking   Eat a diet that is high in fiber and potassium, and low in salt (sodium), added sugar, and fat. An example eating plan is called the DASH (Dietary Approaches to Stop Hypertension) diet. To eat this way: ? Eat plenty of fresh fruits and vegetables. Try to fill half of your plate at each meal with fruits and vegetables. ? Eat whole grains, such as whole wheat pasta, brown rice, or whole grain bread. Fill about one quarter of your plate with whole grains. ? Eat low-fat diary products. ? Avoid fatty cuts of meat, processed or cured meats, and poultry with skin. Fill about one quarter of your plate with lean proteins such as fish, chicken without skin, beans, eggs, and tofu. ? Avoid premade and processed foods. These tend to be higher in sodium, added sugar, and fat.  Reduce your  daily sodium intake. Most people with hypertension should eat less than 1,500 mg of sodium a day.  Limit alcohol intake to no more than 1 drink a day for nonpregnant women and 2 drinks a day for men. One drink equals 12 oz of beer, 5 oz of wine, or 1 oz of hard liquor. Lifestyle  Work with your health care provider to maintain a healthy body weight, or to lose weight. Ask what an ideal weight is for you.  Get at least 30 minutes of exercise that causes your  heart to beat faster (aerobic exercise) most days of the week. Activities may include walking, swimming, or biking.  Include exercise to strengthen your muscles (resistance exercise), such as weight lifting, as part of your weekly exercise routine. Try to do these types of exercises for 30 minutes at least 3 days a week.  Do not use any products that contain nicotine or tobacco, such as cigarettes and e-cigarettes. If you need help quitting, ask your health care provider.  Control any long-term (chronic) conditions you have, such as high cholesterol or diabetes. Monitoring  Monitor your blood pressure at home as told by your health care provider. Your personal target blood pressure may vary depending on your medical conditions, your age, and other factors.  Have your blood pressure checked regularly, as often as told by your health care provider. Working with your health care provider  Review all the medicines you take with your health care provider because there may be side effects or interactions.  Talk with your health care provider about your diet, exercise habits, and other lifestyle factors that may be contributing to hypertension.  Visit your health care provider regularly. Your health care provider can help you create and adjust your plan for managing hypertension. Will I need medicine to control my blood pressure? Your health care provider may prescribe medicine if lifestyle changes are not enough to get your blood pressure under control, and if:  Your systolic blood pressure is 130 or higher.  Your diastolic blood pressure is 80 or higher. Take medicines only as told by your health care provider. Follow the directions carefully. Blood pressure medicines must be taken as prescribed. The medicine does not work as well when you skip doses. Skipping doses also puts you at risk for problems. Contact a health care provider if:  You think you are having a reaction to medicines you have  taken.  You have repeated (recurrent) headaches.  You feel dizzy.  You have swelling in your ankles.  You have trouble with your vision. Get help right away if:  You develop a severe headache or confusion.  You have unusual weakness or numbness, or you feel faint.  You have severe pain in your chest or abdomen.  You vomit repeatedly.  You have trouble breathing. Summary  Hypertension is when the force of blood pumping through your arteries is too strong. If this condition is not controlled, it may put you at risk for serious complications.  Your personal target blood pressure may vary depending on your medical conditions, your age, and other factors. For most people, a normal blood pressure is less than 120/80.  Hypertension is managed by lifestyle changes, medicines, or both. Lifestyle changes include weight loss, eating a healthy, low-sodium diet, exercising more, and limiting alcohol. This information is not intended to replace advice given to you by your health care provider. Make sure you discuss any questions you have with your health care provider. Document Revised:  08/18/2018 Document Reviewed: 03/24/2016 Elsevier Patient Education  [place].

## 2019-09-24 ENCOUNTER — Encounter: Payer: Self-pay | Admitting: Family Medicine

## 2019-09-24 ENCOUNTER — Other Ambulatory Visit: Payer: Self-pay

## 2019-09-24 ENCOUNTER — Ambulatory Visit (INDEPENDENT_AMBULATORY_CARE_PROVIDER_SITE_OTHER): Payer: Medicare Other | Admitting: Family Medicine

## 2019-09-24 VITALS — BP 156/78 | HR 64 | Temp 97.2°F | Resp 16 | Ht 67.0 in | Wt 176.0 lb

## 2019-09-24 DIAGNOSIS — A6920 Lyme disease, unspecified: Secondary | ICD-10-CM | POA: Diagnosis not present

## 2019-09-24 MED ORDER — DOXYCYCLINE HYCLATE 100 MG PO TABS: 100.0000 mg | ORAL_TABLET | Freq: Two times a day (BID) | ORAL | 0 refills | Status: DC

## 2019-09-24 NOTE — Progress Notes (Signed)
Subjective:    Patient ID: [name], female    DOB: 05/03/1956, 64 y.o.   MRN: [id number]  HPI  Patient had to discontinue Lexapro due to "burning on the inside" and a strange sensation to 2 times that she took it.  Was recently bitten by a tick.  Begin noticing a rash on her posterior left calf last week.  Here today for evaluation.    Past Medical History:  Diagnosis Date  . Abdominal aortic aneurysm (AAA) 3.0 cm to 5.0 cm in diameter in female [facility])    None seen on prior imaging?   Marland Kitchen Anxiety   . Cataract   . Chronic left shoulder pain   . Headache   . Hiatal hernia    small  . Hyperlipidemia   . Hypertension   . Pre-diabetes   . Schatzki's ring   . Tremor    Past Surgical History:  Procedure Laterality Date  . CARDIAC CATHETERIZATION  2005   "Ms. Kuehl has essentially normal coronary arteries and normal left ventricular function.  She will be treated empirically with anti-reflux measures"  . CATARACT EXTRACTION, BILATERAL    . CHOLECYSTECTOMY    . COLONOSCOPY    06/16/2004   [id number] rectum/Diminutive polyps of the splenic flexure and the cecum/The remainder of the colonic mucosa appeared normal pathology (hyperplastic polyps).  . COLONOSCOPY N/A 08/07/2015   Dr. Gala Romney: 6 mm descending colon tubular adenoma, multiple medium-mouthed diverticula in sigmoid colon. surveillance 2022  . ESOPHAGOGASTRODUODENOSCOPY  09/11/2001   [id number] ring otherwise normal esophagus/Normal stomach/Normal D1 and D2 status post passage of a 56 [place] dilator  . ESOPHAGOGASTRODUODENOSCOPY N/A 11/11/2017   Procedure: ESOPHAGOGASTRODUODENOSCOPY (EGD);  Surgeon: Daneil Dolin, MD;  Location: [facility];  Service: Endoscopy;  Laterality: N/A;  8:15am  . ESOPHAGOGASTRODUODENOSCOPY (EGD) WITH ESOPHAGEAL DILATION N/A 01/25/2013   Dr. Gala Romney- schatzki's ring- 64 F dilation. small hiatal hernia  . HYSTEROSCOPY WITH D & C N/A 03/06/2019   Procedure: DILATATION AND CURETTAGE  /HYSTEROSCOPY;  Surgeon: Jonnie Kind, MD;  Location: [facility];  Service: Gynecology;  Laterality: N/A;  . [name] DILATION N/A 11/11/2017   Procedure: [name] DILATION;  Surgeon: Daneil Dolin, MD;  Location: [facility];  Service: Endoscopy;  Laterality: N/A;  . POLYPECTOMY N/A 03/06/2019   Procedure: POLYPECTOMY ( REMOVAL OF ENDOMETRIAL POLYP);  Surgeon: Jonnie Kind, MD;  Location: [facility];  Service: Gynecology;  Laterality: N/A;  . TUBAL LIGATION     Current Outpatient Medications on File Prior to Visit  Medication Sig Dispense Refill  . acetaminophen (TYLENOL) 500 MG tablet Take 500 mg by mouth every 6 (six) hours as needed for moderate pain or headache.    . ALPRAZolam (XANAX) 0.5 MG tablet TAKE 1 TABLET(0.5 MG) BY MOUTH TWICE DAILY AS NEEDED FOR ANXIETY 60 tablet 0  . amLODipine (NORVASC) 10 MG tablet TAKE 1 TABLET(10 MG) BY MOUTH AT BEDTIME (Patient taking differently: Take 10 mg by mouth at bedtime. ) 90 tablet 3  . aspirin EC 81 MG tablet Take 81 mg by mouth at bedtime.     . cholecalciferol (VITAMIN D3) 25 MCG (1000 UT) tablet Take 2,000 Units by mouth daily.    . cloNIDine (CATAPRES) 0.1 MG tablet TAKE 1 TABLET BY MOUTH THREE TIMES DAILY AS NEEDED FOR HIGH BLOOD PRESSURE 90 tablet 3  . Cyanocobalamin (B-12) 2500 MCG TABS Take 2,500 mcg by mouth daily.    Marland Kitchen EPINEPHrine 0.3 mg/0.3 mL IJ SOAJ  injection Inject 0.3 mg into the muscle as needed for anaphylaxis.     . fluticasone (FLONASE) 50 MCG/ACT nasal spray Place 2 sprays into both nostrils daily. 16 g 6  . hydrochlorothiazide (HYDRODIURIL) 25 MG tablet TAKE 1 TABLET(25 MG) BY MOUTH DAILY 90 tablet 2  . hydrOXYzine (ATARAX/VISTARIL) 25 MG tablet Take 0.5-1 tablets (12.5-25 mg total) by mouth every 8 (eight) hours as needed for itching. 20 tablet 0  . ipratropium (ATROVENT) 0.03 % nasal spray USE 2 SPRAYS IN EACH NOSTRIL EVERY 12 HOURS (Patient taking differently: Place 2 sprays into both nostrils See admin instructions. Instill  2 sprays into each nostril each morning ,may do a second dose later as needed for allergies) 30 mL 12  . LINZESS 145 MCG CAPS capsule TAKE 1 CAPSULE(145 MCG) BY MOUTH DAILY BEFORE BREAKFAST 30 capsule 3  . MAGNESIUM PO Take 330 mg by mouth daily.    . metoprolol succinate (TOPROL-XL) 100 MG 24 hr tablet Take 1 tablet (100 mg total) by mouth every evening. Take with or immediately following a meal. 90 tablet 3  . mometasone (ELOCON) 0.1 % cream Apply 1 application topically daily as needed (for irritation).     . montelukast (SINGULAIR) 10 MG tablet TAKE 1 TABLET(10 MG) BY MOUTH AT BEDTIME 30 tablet 3  . Omega-3 Fatty Acids (FISH OIL) 1000 MG CAPS Take 1,000 mg by mouth daily.    . ondansetron (ZOFRAN) 4 MG tablet Take 1 tablet (4 mg total) by mouth every 8 (eight) hours as needed for nausea or vomiting. 30 tablet 1  . pantoprazole (PROTONIX) 40 MG tablet TAKE 1 TABLET BY MOUTH TWICE DAILY. TAKE 30 MINUTES BEFORE MEALS 60 tablet 5  . potassium chloride SA (KLOR-CON) 20 MEQ tablet TAKE 1 TABLET(20 MEQ) BY MOUTH DAILY 30 tablet 5  . pravastatin (PRAVACHOL) 20 MG tablet Take 1 tablet (20 mg total) by mouth daily. 90 tablet 1  . rosuvastatin (CRESTOR) 5 MG tablet Take 1 tablet (5 mg total) by mouth daily. 90 tablet 3  . tacrolimus (PROTOPIC) 0.1 % ointment Apply 1 application topically daily as needed (for irritation).     . triamcinolone cream (KENALOG) 0.1 % APPLY EXTERNALLY TO THE AFFECTED AREA TWICE DAILY 45 g 0  . valsartan (DIOVAN) 320 MG tablet TAKE 1 TABLET BY MOUTH EVERY DAY 90 tablet 3  . vitamin C (ASCORBIC ACID) 250 MG tablet Take 500 mg by mouth daily.    . vitamin E 400 UNIT capsule Take 400 Units by mouth daily.    Marland Kitchen escitalopram (LEXAPRO) 10 MG tablet Take 1 tablet (10 mg total) by mouth daily. (Patient not taking: Reported on 09/24/2019) 30 tablet 5   No current facility-administered medications on file prior to visit.   Allergies  Allergen Reactions  . Ciprofloxacin Shortness Of  Breath   Social History   Socioeconomic History  . Marital status: Single    Spouse name: Not on file  . Number of children: 3  . Years of education: Not on file  . Highest education level: Not on file  Occupational History  . Occupation: English as a second language teacher: [employer]  Tobacco Use  . Smoking status: Former Smoker    Packs/day: 0.25    Years: 35.00    Pack years: 8.75    Types: Cigarettes    Quit date: 12/31/2012    Years since quitting: 6.7  . Smokeless tobacco: Never Used  Substance and Sexual Activity  . Alcohol use: Not Currently  Comment: occasionaly  . Drug use: No  . Sexual activity: Not Currently    Partners: Male    Birth control/protection: Post-menopausal, Surgical    Comment: tubal  Other Topics Concern  . Not on file  Social History Narrative   Eats all food groups.    Wears seatbelt.    Has 3 children.   Prior smoker.   Attends church.    Used to work in Charity fundraiser.    Drives.   Enjoys going out with family.    Social Determinants of Health   Financial Resource Strain: Low Risk   . Difficulty of Paying Living Expenses: Not very hard  Food Insecurity:   . Worried About Charity fundraiser in the Last Year:   . Arboriculturist in the Last Year:   Transportation Needs:   . Film/video editor (Medical):   Marland Kitchen Lack of Transportation (Non-Medical):   Physical Activity:   . Days of Exercise per Week:   . Minutes of Exercise per Session:   Stress:   . Feeling of Stress :   Social Connections:   . Frequency of Communication with Friends and Family:   . Frequency of Social Gatherings with Friends and Family:   . Attends Religious Services:   . Active Member of Clubs or Organizations:   . Attends Archivist Meetings:   Marland Kitchen Marital Status:   Intimate Partner Violence:   . Fear of Current or Ex-Partner:   . Emotionally Abused:   Marland Kitchen Physically Abused:   . Sexually Abused:    Family History  Problem Relation Age of Onset  .  Diabetes Other   . Diabetes Mother   . Heart disease Mother   . Heart disease Father   . Diabetes Sister   . Diabetes Brother   . Hypertension Brother   . Diabetes Daughter   . Hypertension Maternal Grandmother   . Stroke Maternal Grandfather   . Early death Paternal Grandfather   . Colon cancer Neg Hx   . Liver disease Neg Hx       Review of Systems  All other systems reviewed and are negative.      Objective:   Physical Exam Vitals reviewed.  Constitutional:      Appearance: She is well-developed.  Cardiovascular:     Rate and Rhythm: Normal rate and regular rhythm.     Heart sounds: Normal heart sounds. No murmur. No friction rub. No gallop.   Pulmonary:     Effort: Pulmonary effort is normal. No respiratory distress.     Breath sounds: Normal breath sounds. No stridor. No wheezing or rales.  Chest:     Chest wall: No tenderness.  Musculoskeletal:     Right hand: Normal. No swelling, deformity, tenderness or bony tenderness. Normal range of motion.     Left hand: Normal. No swelling, deformity, tenderness or bony tenderness. Normal range of motion.  Skin:    Findings: Rash present.       Neurological:     Motor: No abnormal muscle tone.           Assessment & Plan:  Erythema migrans (Lyme disease)  Begin doxycycline 100 mg twice daily for 21 days.  Recheck afterward to discuss starting a different SSRI.

## 2019-10-02 ENCOUNTER — Other Ambulatory Visit: Payer: Self-pay

## 2019-10-16 ENCOUNTER — Emergency Department (HOSPITAL_COMMUNITY)
Admission: EM | Admit: 2019-10-16 | Discharge: 2019-10-17 | Disposition: A | Discharge status: Home / Self Care | Payer: Medicare Other | Attending: Emergency Medicine | Admitting: Emergency Medicine

## 2019-10-16 ENCOUNTER — Encounter (HOSPITAL_COMMUNITY): Payer: Self-pay

## 2019-10-16 ENCOUNTER — Other Ambulatory Visit: Payer: Self-pay

## 2019-10-16 ENCOUNTER — Emergency Department (HOSPITAL_COMMUNITY): Payer: Medicare Other

## 2019-10-16 DIAGNOSIS — E119 Type 2 diabetes mellitus without complications: Secondary | ICD-10-CM | POA: Diagnosis not present

## 2019-10-16 DIAGNOSIS — Z7982 Long term (current) use of aspirin: Secondary | ICD-10-CM | POA: Insufficient documentation

## 2019-10-16 DIAGNOSIS — I10 Essential (primary) hypertension: Secondary | ICD-10-CM | POA: Insufficient documentation

## 2019-10-16 DIAGNOSIS — Z79899 Other long term (current) drug therapy: Secondary | ICD-10-CM | POA: Insufficient documentation

## 2019-10-16 DIAGNOSIS — Z87891 Personal history of nicotine dependence: Secondary | ICD-10-CM | POA: Insufficient documentation

## 2019-10-16 DIAGNOSIS — I714 Abdominal aortic aneurysm, without rupture: Secondary | ICD-10-CM | POA: Insufficient documentation

## 2019-10-16 DIAGNOSIS — R079 Chest pain, unspecified: Secondary | ICD-10-CM | POA: Diagnosis not present

## 2019-10-16 DIAGNOSIS — I700 Atherosclerosis of aorta: Secondary | ICD-10-CM | POA: Diagnosis not present

## 2019-10-16 DIAGNOSIS — M7989 Other specified soft tissue disorders: Secondary | ICD-10-CM | POA: Insufficient documentation

## 2019-10-16 DIAGNOSIS — R2241 Localized swelling, mass and lump, right lower limb: Secondary | ICD-10-CM | POA: Diagnosis not present

## 2019-10-16 DIAGNOSIS — I739 Peripheral vascular disease, unspecified: Secondary | ICD-10-CM

## 2019-10-16 DIAGNOSIS — R200 Anesthesia of skin: Secondary | ICD-10-CM | POA: Diagnosis not present

## 2019-10-16 LAB — BASIC METABOLIC PANEL
Anion gap: 9 (ref 5–15)
BUN: 6 mg/dL — ABNORMAL LOW (ref 8–23)
CO2: 26 mmol/L (ref 22–32)
Calcium: 9.4 mg/dL (ref 8.9–10.3)
Chloride: 107 mmol/L (ref 98–111)
Creatinine, Ser: 0.73 mg/dL (ref 0.44–1.00)
GFR calc Af Amer: >60 mL/min (ref >60)
GFR calc non Af Amer: >60 mL/min (ref >60)
Glucose, Bld: 105 mg/dL — ABNORMAL HIGH (ref 70–99)
Potassium: 3.3 mmol/L — ABNORMAL LOW (ref 3.5–5.1)
Sodium: 142 mmol/L (ref 135–145)

## 2019-10-16 LAB — CBC
HCT: 37.3 % (ref 36.0–46.0)
Hemoglobin: 12.4 g/dL (ref 12.0–15.0)
MCH: 30 pg (ref 26.0–34.0)
MCHC: 33.2 g/dL (ref 30.0–36.0)
MCV: 90.3 fL (ref 80.0–100.0)
Platelets: 286 10*3/uL (ref 150–400)
RBC: 4.13 MIL/uL (ref 3.87–5.11)
RDW: 12.9 % (ref 11.5–15.5)
WBC: 8 10*3/uL (ref 4.0–10.5)
nRBC: 0 % (ref 0.0–0.2)

## 2019-10-16 LAB — TROPONIN I (HIGH SENSITIVITY): Troponin I (High Sensitivity): 9 ng/L (ref <18)

## 2019-10-16 MED ORDER — VALSARTAN 320 MG PO TABS: 320.0000 mg | ORAL_TABLET | Freq: Every day | ORAL | 1 refills | Status: DC

## 2019-10-16 MED ORDER — SODIUM CHLORIDE 0.9% FLUSH
3.0000 mL | Freq: Once | INTRAVENOUS | Status: AC
  Administered 2019-10-17: 3 mL via INTRAVENOUS

## 2019-10-16 NOTE — ED Triage Notes (Signed)
Pt arrives POV for eval of blt LE edema x 3 mos. Pt reports swelling has been intermittent, but the last day or two has been constant. Denies hx of heart failure, takes HCTZ at home. Endorses chest tightness and SOB.

## 2019-10-17 ENCOUNTER — Emergency Department (HOSPITAL_COMMUNITY): Payer: Medicare Other

## 2019-10-17 ENCOUNTER — Emergency Department (HOSPITAL_BASED_OUTPATIENT_CLINIC_OR_DEPARTMENT_OTHER)
Admit: 2019-10-17 | Discharge: 2019-10-17 | Disposition: A | Discharge status: Home / Self Care | Payer: Medicare Other | Attending: Emergency Medicine | Admitting: Emergency Medicine

## 2019-10-17 ENCOUNTER — Telehealth: Payer: Self-pay

## 2019-10-17 DIAGNOSIS — I700 Atherosclerosis of aorta: Secondary | ICD-10-CM | POA: Diagnosis not present

## 2019-10-17 DIAGNOSIS — M7989 Other specified soft tissue disorders: Secondary | ICD-10-CM

## 2019-10-17 DIAGNOSIS — R200 Anesthesia of skin: Secondary | ICD-10-CM | POA: Diagnosis not present

## 2019-10-17 DIAGNOSIS — I739 Peripheral vascular disease, unspecified: Secondary | ICD-10-CM | POA: Diagnosis not present

## 2019-10-17 DIAGNOSIS — R2241 Localized swelling, mass and lump, right lower limb: Secondary | ICD-10-CM | POA: Diagnosis not present

## 2019-10-17 DIAGNOSIS — I10 Essential (primary) hypertension: Secondary | ICD-10-CM | POA: Diagnosis not present

## 2019-10-17 LAB — TROPONIN I (HIGH SENSITIVITY): Troponin I (High Sensitivity): 11 ng/L (ref <18)

## 2019-10-17 MED ORDER — IOHEXOL 350 MG/ML SOLN
125.0000 mL | Freq: Once | INTRAVENOUS | Status: AC | PRN
  Administered 2019-10-17: 125 mL via INTRAVENOUS

## 2019-10-17 MED ORDER — POTASSIUM CHLORIDE CRYS ER 20 MEQ PO TBCR
20.0000 meq | EXTENDED_RELEASE_TABLET | Freq: Every day | ORAL | Status: DC
  Administered 2019-10-17: 20 meq via ORAL
  Filled 2019-10-17: qty 1

## 2019-10-17 MED ORDER — HYDROCHLOROTHIAZIDE 25 MG PO TABS
25.0000 mg | ORAL_TABLET | Freq: Every day | ORAL | Status: DC
  Administered 2019-10-17: 25 mg via ORAL
  Filled 2019-10-17: qty 1

## 2019-10-17 NOTE — ED Notes (Signed)
Walked patient to restroom gait steady

## 2019-10-17 NOTE — ED Provider Notes (Addendum)
[facility] EMERGENCY DEPARTMENT Provider Note   CSN: [id number] Arrival date & time: 10/16/19  2155     History Chief Complaint  Patient presents with  . Leg Swelling    [name] is a 64 y.o. female.  HPI      [name] is a 64 y.o. female, with a history of AAA, hyperlipidemia, HTN, presenting to the ED with swelling to the right leg beginning yesterday afternoon (6/8) while watching TV. Since that time, the swelling has improved.  She states she has not had swelling in this leg before, however, has had swelling in the left leg previously. She denies history of DVT/PE, recent travel, recent surgery, recent trauma.  She denies fever/chills, numbness, weakness, falls/trauma, chest pain, shortness of breath, orthopnea, lower extremity pain, color change, abdominal pain, or any other complaints.   Past Medical History:  Diagnosis Date  . Abdominal aortic aneurysm (AAA) 3.0 cm to 5.0 cm in diameter in female [facility])    None seen on prior imaging?   Marland Kitchen Anxiety   . Cataract   . Chronic left shoulder pain   . Headache   . Hiatal hernia    small  . Hyperlipidemia   . Hypertension   . Pre-diabetes   . Schatzki's ring   . Tremor     Patient Active Problem List   Diagnosis Date Noted  . Hyperlipidemia   . Hypertension   . Abdominal aortic aneurysm (AAA) 3.0 cm to 5.0 cm in diameter in female [facility])   . Nausea without vomiting 12/06/2018  . Loss of weight 12/06/2018  . Controlled diabetes mellitus type 2 with complications ([place]) 29/51/8841  . Essential hypertension 04/25/2017  . Hyperlipidemia LDL goal <70 04/25/2017  . Carotid artery stenosis without cerebral infarction, bilateral 04/25/2017  . AAA (abdominal aortic aneurysm) without rupture ([place]) 04/25/2017  . History of colonic polyps   . Diverticulosis of colon without hemorrhage   . RUQ pain 02/21/2013  . Abdominal pain, epigastric 01/05/2013  . Abdominal bruit 01/05/2013  .  Esophageal dysphagia 01/05/2013  . Constipation 01/05/2013  . GERD (gastroesophageal reflux disease) 01/05/2013    Past Surgical History:  Procedure Laterality Date  . CARDIAC CATHETERIZATION  2005   "Ms. Aden has essentially normal coronary arteries and normal left ventricular function.  She will be treated empirically with anti-reflux measures"  . CATARACT EXTRACTION, BILATERAL    . CHOLECYSTECTOMY    . COLONOSCOPY    06/16/2004   YSA:YTKZSW rectum/Diminutive polyps of the splenic flexure and the cecum/The remainder of the colonic mucosa appeared normal pathology (hyperplastic polyps).  . COLONOSCOPY N/A 08/07/2015   Dr. Gala Romney: 6 mm descending colon tubular adenoma, multiple medium-mouthed diverticula in sigmoid colon. surveillance 2022  . ESOPHAGOGASTRODUODENOSCOPY  09/11/2001   FUX:NATFTDDUK ring otherwise normal esophagus/Normal stomach/Normal D1 and D2 status post passage of a 56 [place] dilator  . ESOPHAGOGASTRODUODENOSCOPY N/A 11/11/2017   Procedure: ESOPHAGOGASTRODUODENOSCOPY (EGD);  Surgeon: Daneil Dolin, MD;  Location: [facility] ENDO SUITE;  Service: Endoscopy;  Laterality: N/A;  8:15am  . ESOPHAGOGASTRODUODENOSCOPY (EGD) WITH ESOPHAGEAL DILATION N/A 01/25/2013   Dr. Gala Romney- schatzki's ring- 49 F dilation. small hiatal hernia  . HYSTEROSCOPY WITH D & C N/A 03/06/2019   Procedure: DILATATION AND CURETTAGE /HYSTEROSCOPY;  Surgeon: Jonnie Kind, MD;  Location: [facility] ORS;  Service: Gynecology;  Laterality: N/A;  . [name] DILATION N/A 11/11/2017   Procedure: [name] DILATION;  Surgeon: Daneil Dolin, MD;  Location: [facility] ENDO SUITE;  Service: Endoscopy;  Laterality: N/A;  . POLYPECTOMY N/A 03/06/2019   Procedure: POLYPECTOMY ( REMOVAL OF ENDOMETRIAL POLYP);  Surgeon: Jonnie Kind, MD;  Location: [facility] ORS;  Service: Gynecology;  Laterality: N/A;  . TUBAL LIGATION       OB History    Gravida  4   Para  3   Term  3   Preterm      AB  1   Living  3     SAB  1   TAB       Ectopic      Multiple      Live Births              Family History  Problem Relation Age of Onset  . Diabetes Other   . Diabetes Mother   . Heart disease Mother   . Heart disease Father   . Diabetes Sister   . Diabetes Brother   . Hypertension Brother   . Diabetes Daughter   . Hypertension Maternal Grandmother   . Stroke Maternal Grandfather   . Early death Paternal Grandfather   . Colon cancer Neg Hx   . Liver disease Neg Hx     Social History   Tobacco Use  . Smoking status: Former Smoker    Packs/day: 0.25    Years: 35.00    Pack years: 8.75    Types: Cigarettes    Quit date: 12/31/2012    Years since quitting: 6.7  . Smokeless tobacco: Never Used  Substance Use Topics  . Alcohol use: Not Currently    Comment: occasionaly  . Drug use: No    Home Medications Prior to Admission medications   Medication Sig Start Date End Date Taking? Authorizing Provider  acetaminophen (TYLENOL) 500 MG tablet Take 500 mg by mouth every 6 (six) hours as needed for moderate pain or headache.    [provider]  ALPRAZolam (XANAX) 0.5 MG tablet TAKE 1 TABLET(0.5 MG) BY MOUTH TWICE DAILY AS NEEDED FOR ANXIETY 09/11/19   Susy Frizzle, MD  amLODipine (NORVASC) 10 MG tablet TAKE 1 TABLET(10 MG) BY MOUTH AT BEDTIME Patient taking differently: Take 10 mg by mouth at bedtime.  01/05/19   Susy Frizzle, MD  aspirin EC 81 MG tablet Take 81 mg by mouth at bedtime.     [provider]  cholecalciferol (VITAMIN D3) 25 MCG (1000 UT) tablet Take 2,000 Units by mouth daily.    [provider]  cloNIDine (CATAPRES) 0.1 MG tablet TAKE 1 TABLET BY MOUTH THREE TIMES DAILY AS NEEDED FOR HIGH BLOOD PRESSURE 06/18/19   Susy Frizzle, MD  Cyanocobalamin (B-12) 2500 MCG TABS Take 2,500 mcg by mouth daily.    [provider]  doxycycline (VIBRA-TABS) 100 MG tablet Take 1 tablet (100 mg total) by mouth 2 (two) times daily. 09/24/19   Susy Frizzle, MD    EPINEPHrine 0.3 mg/0.3 mL IJ SOAJ injection Inject 0.3 mg into the muscle as needed for anaphylaxis.  01/05/19   [provider]  escitalopram (LEXAPRO) 10 MG tablet Take 1 tablet (10 mg total) by mouth daily. Patient not taking: Reported on 09/24/2019 09/13/19   Susy Frizzle, MD  fluticasone [facility]) 50 MCG/ACT nasal spray Place 2 sprays into both nostrils daily. 04/17/19   Susy Frizzle, MD  hydrochlorothiazide (HYDRODIURIL) 25 MG tablet TAKE 1 TABLET(25 MG) BY MOUTH DAILY 03/15/19   Susy Frizzle, MD  hydrOXYzine (ATARAX/VISTARIL) 25 MG tablet Take 0.5-1  tablets (12.5-25 mg total) by mouth every 8 (eight) hours as needed for itching. 12/31/18   Margarita Mail, PA-C  ipratropium (ATROVENT) 0.03 % nasal spray USE 2 SPRAYS IN EACH NOSTRIL EVERY 12 HOURS Patient taking differently: Place 2 sprays into both nostrils See admin instructions. Instill 2 sprays into each nostril each morning ,may do a second dose later as needed for allergies 02/05/19   Susy Frizzle, MD  LINZESS 145 MCG CAPS capsule TAKE 1 CAPSULE(145 MCG) BY MOUTH DAILY BEFORE BREAKFAST 07/18/19   Susy Frizzle, MD  MAGNESIUM PO Take 330 mg by mouth daily.    [provider]  metoprolol succinate (TOPROL-XL) 100 MG 24 hr tablet Take 1 tablet (100 mg total) by mouth every evening. Take with or immediately following a meal. 04/24/19   Pickard, Cammie Mcgee, MD  mometasone (ELOCON) 0.1 % cream Apply 1 application topically daily as needed (for irritation).  01/05/19   [provider]  montelukast (SINGULAIR) 10 MG tablet TAKE 1 TABLET(10 MG) BY MOUTH AT BEDTIME 08/13/19   Susy Frizzle, MD  Omega-3 Fatty Acids (FISH OIL) 1000 MG CAPS Take 1,000 mg by mouth daily.    [provider]  ondansetron (ZOFRAN) 4 MG tablet Take 1 tablet (4 mg total) by mouth every 8 (eight) hours as needed for nausea or vomiting. 08/23/19   Erenest Rasher, PA-C  pantoprazole (PROTONIX) 40 MG tablet TAKE 1 TABLET BY  MOUTH TWICE DAILY. TAKE 30 MINUTES BEFORE MEALS 06/06/19   Carlis Stable, NP  potassium chloride SA (KLOR-CON) 20 MEQ tablet TAKE 1 TABLET(20 MEQ) BY MOUTH DAILY 05/10/19   Susy Frizzle, MD  pravastatin (PRAVACHOL) 20 MG tablet Take 1 tablet (20 mg total) by mouth daily. 07/04/19   Susy Frizzle, MD  rosuvastatin (CRESTOR) 5 MG tablet Take 1 tablet (5 mg total) by mouth daily. 09/13/19   Susy Frizzle, MD  tacrolimus (PROTOPIC) 0.1 % ointment Apply 1 application topically daily as needed (for irritation).  01/08/19   [provider]  triamcinolone cream (KENALOG) 0.1 % APPLY EXTERNALLY TO THE AFFECTED AREA TWICE DAILY 12/28/18   Susy Frizzle, MD  valsartan (DIOVAN) 320 MG tablet Take 1 tablet (320 mg total) by mouth daily. 10/16/19   Susy Frizzle, MD  vitamin C (ASCORBIC ACID) 250 MG tablet Take 500 mg by mouth daily.    [provider]  vitamin E 400 UNIT capsule Take 400 Units by mouth daily.    [provider]    Allergies    Ciprofloxacin  Review of Systems   Review of Systems  Constitutional: Negative for chills and fever.  Respiratory: Negative for shortness of breath.   Cardiovascular: Positive for leg swelling. Negative for chest pain.  Gastrointestinal: Negative for abdominal pain, diarrhea, nausea and vomiting.  Genitourinary: Negative for difficulty urinating.  Neurological: Negative for weakness and numbness.  All other systems reviewed and are negative.   Physical Exam Updated Vital Signs BP (!) 164/72   Pulse (!) 58   Temp 98.1 F (36.7 C)   Resp 16   Ht 5\' 7"  (1.702 m)   Wt 78.5 kg   SpO2 100%   BMI 27.10 kg/m   Physical Exam Vitals and nursing note reviewed.  Constitutional:      General: She is not in acute distress.    Appearance: She is well-developed. She is not diaphoretic.  HENT:     Head: Normocephalic and atraumatic.  Mouth/Throat:     Mouth: Mucous membranes are moist.     Pharynx: Oropharynx is clear.   Eyes:     Conjunctiva/sclera: Conjunctivae normal.  Cardiovascular:     Rate and Rhythm: Normal rate and regular rhythm.     Pulses: Normal pulses.          Radial pulses are 2+ on the right side and 2+ on the left side.       Dorsalis pedis pulses are 2+ on the right side and 2+ on the left side.       Posterior tibial pulses are 2+ on the right side and 2+ on the left side.     Heart sounds: Normal heart sounds.     Comments: Tactile temperature in the extremities appropriate and equal bilaterally. Pulmonary:     Effort: Pulmonary effort is normal. No respiratory distress.     Breath sounds: Normal breath sounds.  Abdominal:     Palpations: Abdomen is soft.     Tenderness: There is no abdominal tenderness. There is no guarding.  Musculoskeletal:     Cervical back: Neck supple.     Comments: Some mild swelling noted to the right lower leg just proximal to the ankle.  No erythema, increased warmth, or tenderness. Range of motion in the right ankle, knee, and hip without pain or noted difficulty.  Lymphadenopathy:     Cervical: No cervical adenopathy.  Skin:    General: Skin is warm and dry.  Neurological:     Mental Status: She is alert.     Comments: Sensation grossly intact to light touch in the lower extremities bilaterally. No saddle anesthesias. Strength 5/5 in the bilateral lower extremities. Coordination intact.  Psychiatric:        Mood and Affect: Mood and affect normal.        Speech: Speech normal.        Behavior: Behavior normal.        ED Results / Procedures / Treatments   Labs (all labs ordered are listed, but only abnormal results are displayed) Labs Reviewed  BASIC METABOLIC PANEL - Abnormal; Notable for the following components:      Result Value   Potassium 3.3 (*)    Glucose, Bld 105 (*)    BUN 6 (*)    All other components within normal limits  CBC  TROPONIN I (HIGH SENSITIVITY)  TROPONIN I (HIGH SENSITIVITY)    EKG EKG  Interpretation  Date/Time:  Tuesday October 16 2019 21:59:21 EDT Ventricular Rate:  70 PR Interval:  170 QRS Duration: 84 QT Interval:  428 QTC Calculation: 462 R Axis:   28 Text Interpretation: Normal sinus rhythm Confirmed by Randal Buba, April ([id number]) on 10/17/2019 4:11:57 AM   Radiology DG Chest 2 View  Result Date: 10/16/2019 CLINICAL DATA:  Chest pain EXAM: CHEST - 2 VIEW COMPARISON:  Radiograph 02/01/2019 FINDINGS: No consolidation, features of edema, pneumothorax, or effusion. The aorta is calcified. The remaining cardiomediastinal contours are unremarkable. Cholecystectomy clips in the right upper quadrant. No acute osseous or soft tissue abnormality. Degenerative changes are present in the imaged spine and shoulders. IMPRESSION: No acute cardiopulmonary abnormality. Electronically Signed   By: Lovena Le M.D.   On: 10/16/2019 22:30    Procedures Procedures (including critical care time)  Medications Ordered in ED Medications  iohexol (OMNIPAQUE) 350 MG/ML injection 125 mL (has no administration in time range)  sodium chloride flush (NS) 0.9 % injection 3 mL (3 mLs Intravenous Given 10/17/19  0548)    ED Course  I have reviewed the triage vital signs and the nursing notes.  Pertinent labs & imaging results that were available during my care of the patient were reviewed by me and considered in my medical decision making (see chart for details).  Clinical Course as of Oct 20 2320  Wed Oct 17, 2019  0500 This was discussed with the patient.  She states she is currently working with her PCP on her hypokalemia.  Potassium(!): 3.3 [SJ]  0528 CT tech called to discuss imaging study request. He states the best way to order the studies to get the same effect would be as a CT angio of the chest and then a CT angio of the abdomen with runoff into the lower extremities.   [SJ]  0805 CT ANGIO CHEST IMPRESSION: 1. No acute finding. 2. Aortic Atherosclerosis  (ICD10-I70.0).   IMPRESSION: 1. No acute finding. 2. Aortic Atherosclerosis (ICD10-I70.0).     [MB]  [id number] DVT US w/o evidence of DVT. The popliteal, posterior tibial, peroneal, and anterior tibial arteries demonstrate monophasic waveforms on the right .   [MB]  36 Call Dr. Claretha Cooper office, and moved up her appointment to 2:30 PM tomorrow.   [MB]    Clinical Course User Index [MB] Garald Balding, PA-C [SJ] Joy, Maceo Pro   MDM Rules/Calculators/A&P                      Patient presents with concern for right lower extremity swelling that began yesterday afternoon.  Patient produced a picture on her phone showing the swollen extremity shortly after onset.  The right lower extremity is noted to be quite swollen through the entire length of the extremity.  There also appears to be discoloration in the extremity. Patient does have a history of AAA and is followed by Dr. Magdalene Molly for this. Due to her history of AAA, the appearance of her leg in the photo, and the sudden onset of her symptoms, we do have to consider dissection.  There are situations in which the patient's symptoms could be intermittent.  Therefore, we decided to perform a CT angio dissection study of the chest, abdomen, and pelvis with runoff into the lower extremities.  Findings and plan of care discussed with April Palumbo, MD. Dr. Randal Buba personally evaluated and examined this patient.   End of shift patient care handoff report given to [facility]) Walker Shadow, PA-C. Plan: Awaiting CT scan. She will also need a duplex US DVT study on the right.    Final Clinical Impression(s) / ED Diagnoses Final diagnoses:  Claudication [facility])    Rx / DC Orders ED Discharge Orders    None       [name] 10/17/19 9450    Palumbo, April, MD 10/17/19 [place], PA-C 10/21/19 2322    Palumbo, April, MD 10/24/19 2307

## 2019-10-17 NOTE — Telephone Encounter (Signed)
Telephone call received from Sharyn Lull, [place] at [facility] requesting an earlier appt for patient to be seen for purple discoloration to Rt leg. Pt ordered to have a LE Venous study completed. Pt scheduled for 10/18/19 arrive at 230pm. Voiced understanding. Minette Brine, RN

## 2019-10-17 NOTE — Discharge Instructions (Addendum)
Please make sure to go to your appointment tomorrow at Dr. Claretha Cooper office at 2:30 PM.  Return to the ER if your symptoms worsen.

## 2019-10-17 NOTE — ED Provider Notes (Signed)
Patient received at signout from [facility], [place].  Please see his note for full HPI.  In short, 64 year old female with swelling to her right leg starting yesterday afternoon while watching TV and purple discoloration.  Since then, her swelling and discoloration had improved when she arrived to the ED.  She has had similar swelling to her left leg before as well.  She has a history of a AAA and follows with Dr. Donzetta Matters with vascular surgery.  There was concern for dissection and CT angio dissection study of the chest, abdomen, pelvis with runoff into the lower extremities was ordered.  There were no acute findings in the CT angio of the chest or the lower extremities, negative for dissection.  Some sigmoid diverticulosis without inflammation was noted.  Lower extremity ultrasound negative for DVT. There was monophasic waveforms in the popliteal, posterior tibial, peroneal and anterior tibial arteries.  Work-up is overall reassuring.  Chest pain work-up also negative.  I reached out to Dr. Claretha Cooper office as the patient stated that she had an appointment at the end of the month.  We were able to move it up to tomorrow at 2:30 PM.  Patient was given supplemental potassium.  Overall work-up reassuring.  Return precautions given, patient voices understanding is agreeable to the plan.  Discussed the case with Dr. Regenia Skeeter who is agreeable to the above plan.  At this stage in the ED course, the patient has been medically screened and is stable for discharge.  Physical Exam  BP (!) 156/54   Pulse (!) 53   Temp 98.1 F (36.7 C)   Resp 16   Ht 5\' 7"  (1.702 m)   Wt 78.5 kg   SpO2 99%   BMI 27.10 kg/m   Physical Exam Vitals reviewed.  Constitutional:      Appearance: Normal appearance.  HENT:     Head: Normocephalic and atraumatic.     Mouth/Throat:     Mouth: Mucous membranes are moist.     Pharynx: Oropharynx is clear.  Eyes:     General:        Right eye: No discharge.        Left eye: No discharge.      Extraocular Movements: Extraocular movements intact.     Conjunctiva/sclera: Conjunctivae normal.  Cardiovascular:     Rate and Rhythm: Normal rate and regular rhythm.     Pulses: Normal pulses.     Heart sounds: Normal heart sounds.  Pulmonary:     Effort: Pulmonary effort is normal.     Breath sounds: Normal breath sounds.  Abdominal:     General: Abdomen is flat.     Tenderness: There is no abdominal tenderness.  Musculoskeletal:        General: Swelling present. Normal range of motion.     Cervical back: Normal range of motion and neck supple.     Comments: Mild edema to the right lower extremity with questionable purplish discoloration.  Full range of motion of ankle, knee.  5/5 strength, sensations intact.  2+ DP pulses.  Left extremity without swelling, discoloration.  No noticeable overlying erythema, warmth, no masses, deformities  Skin:    General: Skin is warm and dry.     Capillary Refill: Capillary refill takes less than 2 seconds.     Findings: No erythema.  Neurological:     General: No focal deficit present.     Mental Status: She is alert and oriented to person, place, and time.  Sensory: No sensory deficit.     Motor: No weakness.  Psychiatric:        Mood and Affect: Mood normal.        Behavior: Behavior normal.     ED Course/Procedures   Clinical Course as of Oct 17 1050  Wed Oct 17, 2019  0500 This was discussed with the patient.  She states she is currently working with her PCP on her hypokalemia.  Potassium(!): 3.3 [SJ]  0528 CT tech called to discuss imaging study request. He states the best way to order the studies to get the same effect would be as a CT angio of the chest and then a CT angio of the abdomen with runoff into the lower extremities.   [SJ]  0805 CT ANGIO CHEST IMPRESSION: 1. No acute finding. 2. Aortic Atherosclerosis (ICD10-I70.0).   IMPRESSION: 1. No acute finding. 2. Aortic Atherosclerosis (ICD10-I70.0).     [MB]   [id number] DVT US w/o evidence of DVT. The popliteal, posterior tibial, peroneal, and anterior tibial arteries demonstrate monophasic waveforms on the right .   [MB]  28 Call Dr. Claretha Cooper office, and moved up her appointment to 2:30 PM tomorrow.   [MB]    Clinical Course User Index [MB] Garald Balding, PA-C [SJ] Lorayne Bender, PA-C    Procedures  MDM       Lyndel Safe 10/17/19 1052    Sherwood Gambler, MD 10/17/19 1247

## 2019-10-17 NOTE — Progress Notes (Signed)
Right lower extremity venous duplex has been completed. Preliminary results can be found in CV Proc through chart review.  Results were given to Sharyn Lull PA.  10/17/19 9:43 AM Carlos Levering RVT

## 2019-10-18 ENCOUNTER — Other Ambulatory Visit: Payer: Self-pay

## 2019-10-18 ENCOUNTER — Ambulatory Visit: Payer: Medicare Other | Admitting: Physician Assistant

## 2019-10-18 VITALS — BP 149/82 | HR 104 | Resp 20 | Ht 67.0 in | Wt 174.1 lb

## 2019-10-18 DIAGNOSIS — I83893 Varicose veins of bilateral lower extremities with other complications: Secondary | ICD-10-CM

## 2019-10-18 NOTE — Progress Notes (Signed)
[facility] [place]   Reason for referral: Swollen right > left  leg  History of Present Illness  [name] is a 64 y.o. female who presents with chief complaint: right swollen leg.  Patient notes, onset of swelling 2 months ago on the right LE and the left has on/off chronic edema, associated with prolonged sitting and standing.  The patient has had no history of DVT, + history of varicose vein, no history of venous stasis ulcers, no history of  Lymphedema and no history of skin changes in lower legs.  There is no family history of venous disorders.  The patient has not used compression stockings in the past.  She has been followed for a small AAA and B asymptomatic carotid stenosis.  She presented to the ED with her right LE swollen and purplish skin discoloration.  She states she has tingling and decreased sensations when it gets significantly swollen on the right.   She denise fever, chills and history of non healing wounds.    The ED course revealed a negative DVT study, CT was negative for increased AAA or dissection.  She had palpable LE pedal pulses.  She takes a daily Aspirin, HCTZ  And Statin.  She is active walking daily for about 45 minuets without rest breaks.    Past Medical History:  Diagnosis Date  . Abdominal aortic aneurysm (AAA) 3.0 cm to 5.0 cm in diameter in female [facility])    None seen on prior imaging?   Marland Kitchen Anxiety   . Cataract   . Chronic left shoulder pain   . Headache   . Hiatal hernia    small  . Hyperlipidemia   . Hypertension   . Pre-diabetes   . Schatzki's ring   . Tremor     Past Surgical History:  Procedure Laterality Date  . CARDIAC CATHETERIZATION  2005   "Ms. Behan has essentially normal coronary arteries and normal left ventricular function.  She will be treated empirically with anti-reflux measures"  . CATARACT EXTRACTION, BILATERAL    . CHOLECYSTECTOMY    . COLONOSCOPY    06/16/2004   IZT:IWPYKD rectum/Diminutive  polyps of the splenic flexure and the cecum/The remainder of the colonic mucosa appeared normal pathology (hyperplastic polyps).  . COLONOSCOPY N/A 08/07/2015   Dr. Gala Romney: 6 mm descending colon tubular adenoma, multiple medium-mouthed diverticula in sigmoid colon. surveillance 2022  . ESOPHAGOGASTRODUODENOSCOPY  09/11/2001   XIP:[id number] ring otherwise normal esophagus/Normal stomach/Normal D1 and D2 status post passage of a 56 [place] dilator  . ESOPHAGOGASTRODUODENOSCOPY N/A 11/11/2017   Procedure: ESOPHAGOGASTRODUODENOSCOPY (EGD);  Surgeon: Daneil Dolin, MD;  Location: [facility];  Service: Endoscopy;  Laterality: N/A;  8:15am  . ESOPHAGOGASTRODUODENOSCOPY (EGD) WITH ESOPHAGEAL DILATION N/A 01/25/2013   Dr. Gala Romney- schatzki's ring- 69 F dilation. small hiatal hernia  . HYSTEROSCOPY WITH D & C N/A 03/06/2019   Procedure: DILATATION AND CURETTAGE /HYSTEROSCOPY;  Surgeon: Jonnie Kind, MD;  Location: [facility] OR[facility];  Service: Gynecology;  Laterality: N/A;  . [name] DILATION N/A 11/11/2017   Procedure: [name] DILATION;  Surgeon: Daneil Dolin, MD;  Location: [facility];  Service: Endoscopy;  Laterality: N/A;  . POLYPECTOMY N/A 03/06/2019   Procedure: POLYPECTOMY ( REMOVAL OF ENDOMETRIAL POLYP);  Surgeon: Jonnie Kind, MD;  Location: [facility];  Service: Gynecology;  Laterality: N/A;  . TUBAL LIGATION      Social History   Socioeconomic History  . Marital status: Single  Spouse name: Not on file  . Number of children: 3  . Years of education: Not on file  . Highest education level: Not on file  Occupational History  . Occupation: English as a second language teacher: [employer]  Tobacco Use  . Smoking status: Former Smoker    Packs/day: 0.25    Years: 35.00    Pack years: 8.75    Types: Cigarettes    Quit date: 12/31/2012    Years since quitting: 6.8  . Smokeless tobacco: Never Used  Vaping Use  . Vaping Use: Never used  Substance and Sexual Activity  . Alcohol use: Not  Currently    Comment: occasionaly  . Drug use: No  . Sexual activity: Not Currently    Partners: Male    Birth control/protection: Post-menopausal, Surgical    Comment: tubal  Other Topics Concern  . Not on file  Social History Narrative   Eats all food groups.    Wears seatbelt.    Has 3 children.   Prior smoker.   Attends church.    Used to work in Charity fundraiser.    Drives.   Enjoys going out with family.    Social Determinants of Health   Financial Resource Strain: Low Risk   . Difficulty of Paying Living Expenses: Not very hard  Food Insecurity:   . Worried About Charity fundraiser in the Last Year:   . Arboriculturist in the Last Year:   Transportation Needs:   . Film/video editor (Medical):   Marland Kitchen Lack of Transportation (Non-Medical):   Physical Activity:   . Days of Exercise per Week:   . Minutes of Exercise per Session:   Stress:   . Feeling of Stress :   Social Connections:   . Frequency of Communication with Friends and Family:   . Frequency of Social Gatherings with Friends and Family:   . Attends Religious Services:   . Active Member of Clubs or Organizations:   . Attends Archivist Meetings:   Marland Kitchen Marital Status:   Intimate Partner Violence:   . Fear of Current or Ex-Partner:   . Emotionally Abused:   Marland Kitchen Physically Abused:   . Sexually Abused:     Family History  Problem Relation Age of Onset  . Diabetes Other   . Diabetes Mother   . Heart disease Mother   . Heart disease Father   . Diabetes Sister   . Diabetes Brother   . Hypertension Brother   . Diabetes Daughter   . Hypertension Maternal Grandmother   . Stroke Maternal Grandfather   . Early death Paternal Grandfather   . Colon cancer Neg Hx   . Liver disease Neg Hx     Current Outpatient Medications on File Prior to Visit  Medication Sig Dispense Refill  . zinc gluconate 50 MG tablet Take 50 mg by mouth daily.    Marland Kitchen acetaminophen (TYLENOL) 500 MG tablet Take 500 mg by mouth every  6 (six) hours as needed for moderate pain or headache.    . ALPRAZolam (XANAX) 0.5 MG tablet TAKE 1 TABLET(0.5 MG) BY MOUTH TWICE DAILY AS NEEDED FOR ANXIETY (Patient taking differently: Take 0.5 mg by mouth 2 (two) times daily as needed for anxiety. ) 60 tablet 0  . amLODipine (NORVASC) 10 MG tablet TAKE 1 TABLET(10 MG) BY MOUTH AT BEDTIME (Patient taking differently: Take 10 mg by mouth at bedtime. ) 90 tablet 3  . aspirin EC 81 MG tablet  Take 81 mg by mouth at bedtime.     . cholecalciferol (VITAMIN D3) 25 MCG (1000 UT) tablet Take 2,000 Units by mouth daily.    . cloNIDine (CATAPRES) 0.1 MG tablet TAKE 1 TABLET BY MOUTH THREE TIMES DAILY AS NEEDED FOR HIGH BLOOD PRESSURE (Patient not taking: Reported on 10/17/2019) 90 tablet 3  . Cyanocobalamin (B-12) 2500 MCG TABS Take 2,500 mcg by mouth daily.    Marland Kitchen doxycycline (VIBRA-TABS) 100 MG tablet Take 1 tablet (100 mg total) by mouth 2 (two) times daily. (Patient not taking: Reported on 10/17/2019) 42 tablet 0  . EPINEPHrine 0.3 mg/0.3 mL IJ SOAJ injection Inject 0.3 mg into the muscle as needed for anaphylaxis.     Marland Kitchen escitalopram (LEXAPRO) 10 MG tablet Take 1 tablet (10 mg total) by mouth daily. 30 tablet 5  . fluticasone (FLONASE) 50 MCG/ACT nasal spray Place 2 sprays into both nostrils daily. 16 g 6  . hydrochlorothiazide (HYDRODIURIL) 25 MG tablet TAKE 1 TABLET(25 MG) BY MOUTH DAILY (Patient taking differently: Take 25 mg by mouth daily. ) 90 tablet 2  . hydrOXYzine (ATARAX/VISTARIL) 25 MG tablet Take 0.5-1 tablets (12.5-25 mg total) by mouth every 8 (eight) hours as needed for itching. 20 tablet 0  . ipratropium (ATROVENT) 0.03 % nasal spray USE 2 SPRAYS IN EACH NOSTRIL EVERY 12 HOURS (Patient taking differently: Place 2 sprays into both nostrils See admin instructions. Instill 2 sprays into each nostril each morning ,may do a second dose later as needed for allergies) 30 mL 12  . LINZESS 145 MCG CAPS capsule TAKE 1 CAPSULE(145 MCG) BY MOUTH DAILY BEFORE  BREAKFAST (Patient taking differently: Take 145 mcg by mouth daily before breakfast. ) 30 capsule 3  . loratadine (CLARITIN) 10 MG tablet Take 10 mg by mouth daily as needed for allergies.    Marland Kitchen MAGNESIUM PO Take 330 mg by mouth daily.    . metoprolol succinate (TOPROL-XL) 100 MG 24 hr tablet Take 1 tablet (100 mg total) by mouth every evening. Take with or immediately following a meal. 90 tablet 3  . mometasone (ELOCON) 0.1 % cream Apply 1 application topically daily as needed (for irritation).     . montelukast (SINGULAIR) 10 MG tablet TAKE 1 TABLET(10 MG) BY MOUTH AT BEDTIME (Patient taking differently: Take 10 mg by mouth at bedtime. ) 30 tablet 3  . Multiple Vitamins-Minerals (ZINC PO) Take 1 tablet by mouth daily.    . ondansetron (ZOFRAN) 4 MG tablet Take 1 tablet (4 mg total) by mouth every 8 (eight) hours as needed for nausea or vomiting. 30 tablet 1  . pantoprazole (PROTONIX) 40 MG tablet TAKE 1 TABLET BY MOUTH TWICE DAILY. TAKE 30 MINUTES BEFORE MEALS (Patient taking differently: Take 40 mg by mouth 2 (two) times daily before a meal. ) 60 tablet 5  . potassium chloride SA (KLOR-CON) 20 MEQ tablet TAKE 1 TABLET(20 MEQ) BY MOUTH DAILY (Patient taking differently: Take 20 mEq by mouth daily. ) 30 tablet 5  . pravastatin (PRAVACHOL) 20 MG tablet Take 1 tablet (20 mg total) by mouth daily. (Patient not taking: Reported on 10/17/2019) 90 tablet 1  . rosuvastatin (CRESTOR) 5 MG tablet Take 1 tablet (5 mg total) by mouth daily. (Patient not taking: Reported on 10/18/2019) 90 tablet 3  . sodium chloride (OCEAN) 0.65 % SOLN nasal spray Place 1 spray into both nostrils as needed for congestion.    . tacrolimus (PROTOPIC) 0.1 % ointment Apply 1 application topically daily as needed (for  irritation).     . triamcinolone cream (KENALOG) 0.1 % APPLY EXTERNALLY TO THE AFFECTED AREA TWICE DAILY (Patient taking differently: Apply 1 application topically 2 (two) times daily as needed (Rash). ) 45 g 0  . valsartan  (DIOVAN) 320 MG tablet Take 1 tablet (320 mg total) by mouth daily. 90 tablet 1  . vitamin C (ASCORBIC ACID) 250 MG tablet Take 500 mg by mouth daily.    . vitamin E 400 UNIT capsule Take 400 Units by mouth daily.     No current facility-administered medications on file prior to visit.    Allergies as of 10/18/2019 - Review Complete 10/18/2019  Allergen Reaction Noted  . Ciprofloxacin Shortness Of Breath 12/27/2011  . Latex Other (See Comments) 10/17/2019     ROS:   General:  No weight loss, Fever, chills  HEENT: No recent headaches, no nasal bleeding, no visual changes, no sore throat  Neurologic: No dizziness, blackouts, seizures. No recent symptoms of stroke or mini- stroke. No recent episodes of slurred speech, or temporary blindness.  Cardiac: No recent episodes of chest pain/pressure, no shortness of breath at rest.  No shortness of breath with exertion.  Denies history of atrial fibrillation or irregular heartbeat  Vascular: No history of rest pain in feet.  No history of claudication.  No history of non-healing ulcer, No history of DVT   Pulmonary: No home oxygen, no productive cough, no hemoptysis,  No asthma or wheezing  Musculoskeletal:  [ ]  Arthritis, [ ]  Low back pain,  [ ]  Joint pain  Hematologic:No history of hypercoagulable state.  No history of easy bleeding.  No history of anemia  Gastrointestinal: No hematochezia or melena,  No gastroesophageal reflux, no trouble swallowing  Urinary: [ ]  chronic Kidney disease, [ ]  on HD - [ ]  MWF or [ ]  TTHS, [ ]  Burning with urination, [ ]  Frequent urination, [ ]  Difficulty urinating;   Skin: No rashes  Psychological: No history of anxiety,  + history of depression  Physical Examination  Vitals:   10/18/19 1437  BP: (!) 149/82  Pulse: (!) 104  Resp: 20  SpO2: 100%  Weight: 174 lb 1.6 oz (79 kg)  Height: 5\' 7"  (1.702 m)    Body mass index is 27.27 kg/m.  General:  Alert and oriented, no acute distress HEENT:  Normal Neck: No bruit or JVD Pulmonary: Clear to auscultation bilaterally Cardiac: Regular Rate and Rhythm without murmur Abdomen: Soft, non-tender, non-distended, no mass, no scars Skin: No rash          Extremity Pulses:  2+ radial, brachial, femoral, dorsalis pedil pulses bilaterally Musculoskeletal: No deformity with positive right > left LE edema  Neurologic: Upper and lower extremity motor 5/5 and symmetric  DATA:  +---------+---------------+---------+-----------+----------+--------------+   RIGHT  CompressibilityPhasicitySpontaneityPropertiesThrombus  Aging  +---------+---------------+---------+-----------+----------+--------------+   CFV   Full      Yes   Yes                    +---------+---------------+---------+-----------+----------+--------------+   SFJ   Full                                 +---------+---------------+---------+-----------+----------+--------------+   FV Prox Full                                 +---------+---------------+---------+-----------+----------+--------------+   FV Mid  Full                                 +---------+---------------+---------+-----------+----------+--------------+   FV DistalFull                                 +---------+---------------+---------+-----------+----------+--------------+   PFV   Full                                 +---------+---------------+---------+-----------+----------+--------------+   POP   Full      Yes   Yes                    +---------+---------------+---------+-----------+----------+--------------+   PTV   Full                                   +---------+---------------+---------+-----------+----------+--------------+   PERO   Full                                 +---------+---------------+---------+-----------+----------+--------------+    The popliteal, posterior tibial, peroneal, and anterior tibial arteries  demonstrate monophasic waveforms.      +----+---------------+---------+-----------+----------+--------------+  LEFTCompressibilityPhasicitySpontaneityPropertiesThrombus Aging  +----+---------------+---------+-----------+----------+--------------+  CFV Full      Yes   Yes                  +----+---------------+---------+-----------+----------+--------------+     Summary:  RIGHT:  - There is no evidence of deep vein thrombosis in the lower extremity.    - No cystic structure found in the popliteal fossa.    LEFT:  - No evidence of common femoral vein obstruction.   CT ANGIOGRAPHY OF ABDOMINAL AORTA WITH ILIOFEMORAL RUNOFF  TECHNIQUE: Multidetector CT imaging of the abdomen, pelvis and lower extremities was performed using the standard protocol during bolus administration of intravenous contrast. Multiplanar CT image reconstructions and MIPs were obtained to evaluate the vascular anatomy.  CONTRAST:  145mL OMNIPAQUE IOHEXOL 350 MG/ML SOLN  COMPARISON:  02/23/2013  FINDINGS: VASCULAR  Aorta: Extensive calcific atherosclerosis of the abdominal aorta without significant aneurysm, occlusive process, dissection, evidence of rupture, retroperitoneal hemorrhage. Suprarenal aortic diameter 2.5 cm. Infrarenal aorta 1.7 cm.  Celiac: Atherosclerotic origin but remains patent including its branches.  SMA: Atherosclerotic origin but remains patent including its branches.  Renals: Atherosclerotic origins with mild ostial narrowings but remain patent. No accessory renal artery appreciated.  IMA: Heavily calcified origin  but appears to remain patent off the distal aorta including its branches.  RIGHT Lower Extremity  Inflow: Heavily calcified right iliac vasculature. Luminal narrowing suspected of the right common and external iliac arteries without occlusion. Difficult to exclude some degree of right inflow disease. Right hypogastric remains patent.  Outflow: Right common femoral, profunda femoral, and SFA remain patent. Multifocal areas of luminal narrowing and calcification of the SFA. Difficult to exclude distal SFA disease but no significant chronic appearing occlusion. Popliteal artery is also patent across the knee.  Runoff: Small caliber calcified disease runoff vessels. Adjacent venous contamination. Proximal 3 vessel runoff appears patent. Heavily calcified right anterior tibial artery appears to occlude above the ankle. Dominant runoff vessel into the foot is the posterior tibial artery.  LEFT Lower Extremity  Inflow: Similar heavily  calcified left iliac vasculature. Luminal narrowing suspected of the left common and external iliac arteries. Mild inflow disease difficult to exclude. Left hypogastric artery is severely disease or appears to remain patent.  Outflow: Left common femoral, profunda femoral, and SFA are patent. Left SFA also demonstrates multifocal areas of luminal narrowing and calcification but to a lesser degree than the right leg. Popliteal artery remains patent across the knee.  Runoff: Trifurcation vessels are small in caliber and disease proximally but appear to be patent into the lower leg. Left anterior tibial artery appears to taper to occlusion. Dominant runoff to the ankle is via the peroneal and posterior tibial arteries.  Veins: Dedicated venous phase imaging not performed.  Review of the MIP images confirms the above findings.  NON-VASCULAR  Lower chest: No acute abnormality.  Hepatobiliary: Included portion of the liver demonstrates no  large focal abnormality or biliary dilatation. Remote cholecystectomy. Common bile duct nondilated.  Pancreas: Unremarkable. No pancreatic ductal dilatation or surrounding inflammatory changes.  Spleen: Normal in size without focal abnormality.  Adrenals/Urinary Tract: Stable adrenal thickening, suspect hyperplasia. Right renal cortical atrophy noted as before. Right renal cyst measures 5.4 cm inferiorly. No renal obstruction or hydronephrosis. Ureters are symmetric and decompressed. No hydroureter or ureteral calculus. Bladder unremarkable.  Stomach/Bowel: Negative for bowel obstruction, significant dilatation, ileus, or free air. Sigmoid diverticulosis noted without acute inflammatory process. Appendix not visualized.  No free fluid, fluid collection, hemorrhage, hematoma, or abscess.  Lymphatic: No bulky adenopathy.  Reproductive: Uterus and bilateral adnexa are unremarkable.  Other: No abdominal wall hernia or abnormality. No abdominopelvic ascites.  Musculoskeletal: Degenerative changes of the lumbar spine. Lower lumbar facet arthropathy. L5-S1 vacuum disc phenomenon. No acute osseous finding or compression fracture.  IMPRESSION: VASCULAR  Chronic appearing heavily calcified aortoiliac vasculature without acute vascular finding. Difficult to exclude a degree of iliac inflow disease, right greater than left.  Bilateral SFA peripheral vascular disease also worse on the right without evidence of occlusion or acute vascular finding.  Small caliber disease peripheral runoff below the knees with anterior tibial artery occlusions suspected bilaterally. Dominant runoff vessels to the foot are the posterior tibial arteries.  NON-VASCULAR  Chronic postoperative findings as above. No other acute intra-abdominal or pelvic process.   Assessment: AAA Suprarenal aortic calcific atherosclerosis diameter 2.5 cm. Infrarenal aorta 1.7 cm. Bilateral SFA  peripheral vascular disease also worse on the right without evidence of occlusion or acute vascular finding.  Bilateral SFA peripheral vascular disease also worse on the right without evidence of occlusion or acute vascular finding.  Small caliber disease peripheral runoff below the knees with anterior tibial artery occlusions suspected bilaterally. Dominant runoff vessels to the foot are the posterior tibial arteries.  Right > Left LE edema with visible varicose veins B LE  Plan: She was measured today for thigh high compression.  She will wear them daily, pool therapy when available and elevation.  Continue daily exercise.  F/u with a venous reflux study and them 3 month f/u in our vein clinic for possible intervention.  She will have  her venous reflux in July when she has a carotid duplex f/u as well as baseline ABI's.     She does have calcified arterial evidence on CTA.  She denise symptoms of rest pain and claudication when she goes on her daily walks.  I could palpate DP pulses B and she denise history of non healing ulcers.     Roxy Horseman PA-C [facility]  [place] Office: [phone]  MD in clinic [place].

## 2019-10-23 ENCOUNTER — Other Ambulatory Visit: Payer: Self-pay | Admitting: *Deleted

## 2019-10-23 DIAGNOSIS — I6523 Occlusion and stenosis of bilateral carotid arteries: Secondary | ICD-10-CM

## 2019-10-23 DIAGNOSIS — I83893 Varicose veins of bilateral lower extremities with other complications: Secondary | ICD-10-CM

## 2019-10-25 ENCOUNTER — Ambulatory Visit: Payer: Medicare Other

## 2019-10-25 ENCOUNTER — Encounter (HOSPITAL_COMMUNITY): Payer: Medicare Other

## 2019-10-29 ENCOUNTER — Ambulatory Visit (INDEPENDENT_AMBULATORY_CARE_PROVIDER_SITE_OTHER): Payer: Medicare Other | Admitting: Family Medicine

## 2019-10-29 ENCOUNTER — Other Ambulatory Visit: Payer: Self-pay

## 2019-10-29 VITALS — BP 140/74 | HR 62 | Temp 97.4°F | Ht 67.0 in | Wt 173.0 lb

## 2019-10-29 DIAGNOSIS — R5382 Chronic fatigue, unspecified: Secondary | ICD-10-CM | POA: Diagnosis not present

## 2019-10-29 DIAGNOSIS — I10 Essential (primary) hypertension: Secondary | ICD-10-CM | POA: Diagnosis not present

## 2019-10-29 DIAGNOSIS — E119 Type 2 diabetes mellitus without complications: Secondary | ICD-10-CM | POA: Diagnosis not present

## 2019-10-29 DIAGNOSIS — Z1329 Encounter for screening for other suspected endocrine disorder: Secondary | ICD-10-CM | POA: Diagnosis not present

## 2019-10-29 NOTE — Progress Notes (Signed)
Subjective:    Patient ID: [name], female    DOB: 1955/07/15, 64 y.o.   MRN: [id number]  HPI  Patient reports feeling extremely tired with no energy.  She has been checking her blood pressure frequently at home over the last month.  Her systolic blood pressures average between 98 and 140 with the vast majority between 101 110.  Her diastolic blood pressures are between 50 and 60.  She does feel lightheaded.  She denies any chest pain shortness of breath dyspnea on exertion.  She is on metoprolol, amlodipine, hydrochlorothiazide, and valsartan.  She seldom uses clonidine on an as-needed basis for high blood pressure.  Recently she was at the emergency room where a BMP and a CBC were normal except for low potassium Past Medical History:  Diagnosis Date  . Abdominal aortic aneurysm (AAA) 3.0 cm to 5.0 cm in diameter in female [facility])    None seen on prior imaging?   Marland Kitchen Anxiety   . Cataract   . Chronic left shoulder pain   . Headache   . Hiatal hernia    small  . Hyperlipidemia   . Hypertension   . Pre-diabetes   . Schatzki's ring   . Tremor    Past Surgical History:  Procedure Laterality Date  . CARDIAC CATHETERIZATION  2005   "Ms. Gootee has essentially normal coronary arteries and normal left ventricular function.  She will be treated empirically with anti-reflux measures"  . CATARACT EXTRACTION, BILATERAL    . CHOLECYSTECTOMY    . COLONOSCOPY    06/16/2004   PVV:[id number] rectum/Diminutive polyps of the splenic flexure and the cecum/The remainder of the colonic mucosa appeared normal pathology (hyperplastic polyps).  . COLONOSCOPY N/A 08/07/2015   Dr. Gala Romney: 6 mm descending colon tubular adenoma, multiple medium-mouthed diverticula in sigmoid colon. surveillance 2022  . ESOPHAGOGASTRODUODENOSCOPY  09/11/2001   BEM:[id number] ring otherwise normal esophagus/Normal stomach/Normal D1 and D2 status post passage of a 56 [place] Maloney dilator  . ESOPHAGOGASTRODUODENOSCOPY N/A  11/11/2017   Procedure: ESOPHAGOGASTRODUODENOSCOPY (EGD);  Surgeon: Daneil Dolin, MD;  Location: [facility];  Service: Endoscopy;  Laterality: N/A;  8:15am  . ESOPHAGOGASTRODUODENOSCOPY (EGD) WITH ESOPHAGEAL DILATION N/A 01/25/2013   Dr. Gala Romney- schatzki's ring- 44 F dilation. small hiatal hernia  . HYSTEROSCOPY WITH D & C N/A 03/06/2019   Procedure: DILATATION AND CURETTAGE /HYSTEROSCOPY;  Surgeon: Jonnie Kind, MD;  Location: [facility] ORS;  Service: Gynecology;  Laterality: N/A;  . [name] DILATION N/A 11/11/2017   Procedure: [name] DILATION;  Surgeon: Daneil Dolin, MD;  Location: [facility];  Service: Endoscopy;  Laterality: N/A;  . POLYPECTOMY N/A 03/06/2019   Procedure: POLYPECTOMY ( REMOVAL OF ENDOMETRIAL POLYP);  Surgeon: Jonnie Kind, MD;  Location: [facility] ORS;  Service: Gynecology;  Laterality: N/A;  . TUBAL LIGATION     Current Outpatient Medications on File Prior to Visit  Medication Sig Dispense Refill  . acetaminophen (TYLENOL) 500 MG tablet Take 500 mg by mouth every 6 (six) hours as needed for moderate pain or headache.    . ALPRAZolam (XANAX) 0.5 MG tablet TAKE 1 TABLET(0.5 MG) BY MOUTH TWICE DAILY AS NEEDED FOR ANXIETY (Patient taking differently: Take 0.5 mg by mouth 2 (two) times daily as needed for anxiety. ) 60 tablet 0  . amLODipine (NORVASC) 10 MG tablet TAKE 1 TABLET(10 MG) BY MOUTH AT BEDTIME (Patient taking differently: Take 10 mg by mouth at bedtime. ) 90 tablet 3  . aspirin EC 81  MG tablet Take 81 mg by mouth at bedtime.     . cholecalciferol (VITAMIN D3) 25 MCG (1000 UT) tablet Take 2,000 Units by mouth daily.    . Cyanocobalamin (B-12) 2500 MCG TABS Take 2,500 mcg by mouth daily.    Marland Kitchen escitalopram (LEXAPRO) 10 MG tablet Take 1 tablet (10 mg total) by mouth daily. 30 tablet 5  . fluticasone (FLONASE) 50 MCG/ACT nasal spray Place 2 sprays into both nostrils daily. 16 g 6  . hydrochlorothiazide (HYDRODIURIL) 25 MG tablet TAKE 1 TABLET(25 MG) BY MOUTH DAILY  (Patient taking differently: Take 25 mg by mouth daily. ) 90 tablet 2  . hydrOXYzine (ATARAX/VISTARIL) 25 MG tablet Take 0.5-1 tablets (12.5-25 mg total) by mouth every 8 (eight) hours as needed for itching. 20 tablet 0  . ipratropium (ATROVENT) 0.03 % nasal spray USE 2 SPRAYS IN EACH NOSTRIL EVERY 12 HOURS (Patient taking differently: Place 2 sprays into both nostrils See admin instructions. Instill 2 sprays into each nostril each morning ,may do a second dose later as needed for allergies) 30 mL 12  . LINZESS 145 MCG CAPS capsule TAKE 1 CAPSULE(145 MCG) BY MOUTH DAILY BEFORE BREAKFAST (Patient taking differently: Take 145 mcg by mouth daily before breakfast. ) 30 capsule 3  . loratadine (CLARITIN) 10 MG tablet Take 10 mg by mouth daily as needed for allergies.    Marland Kitchen MAGNESIUM PO Take 330 mg by mouth daily.    . metoprolol succinate (TOPROL-XL) 100 MG 24 hr tablet Take 1 tablet (100 mg total) by mouth every evening. Take with or immediately following a meal. 90 tablet 3  . mometasone (ELOCON) 0.1 % cream Apply 1 application topically daily as needed (for irritation).     . montelukast (SINGULAIR) 10 MG tablet TAKE 1 TABLET(10 MG) BY MOUTH AT BEDTIME (Patient taking differently: Take 10 mg by mouth at bedtime. ) 30 tablet 3  . Multiple Vitamins-Minerals (ZINC PO) Take 1 tablet by mouth daily.    . ondansetron (ZOFRAN) 4 MG tablet Take 1 tablet (4 mg total) by mouth every 8 (eight) hours as needed for nausea or vomiting. 30 tablet 1  . pantoprazole (PROTONIX) 40 MG tablet TAKE 1 TABLET BY MOUTH TWICE DAILY. TAKE 30 MINUTES BEFORE MEALS (Patient taking differently: Take 40 mg by mouth 2 (two) times daily before a meal. ) 60 tablet 5  . potassium chloride SA (KLOR-CON) 20 MEQ tablet TAKE 1 TABLET(20 MEQ) BY MOUTH DAILY (Patient taking differently: Take 20 mEq by mouth daily. ) 30 tablet 5  . rosuvastatin (CRESTOR) 5 MG tablet Take 1 tablet (5 mg total) by mouth daily. 90 tablet 3  . sodium chloride (OCEAN)  0.65 % SOLN nasal spray Place 1 spray into both nostrils as needed for congestion.    . tacrolimus (PROTOPIC) 0.1 % ointment Apply 1 application topically daily as needed (for irritation).     . triamcinolone cream (KENALOG) 0.1 % APPLY EXTERNALLY TO THE AFFECTED AREA TWICE DAILY (Patient taking differently: Apply 1 application topically 2 (two) times daily as needed (Rash). ) 45 g 0  . valsartan (DIOVAN) 320 MG tablet Take 1 tablet (320 mg total) by mouth daily. 90 tablet 1  . vitamin C (ASCORBIC ACID) 250 MG tablet Take 500 mg by mouth daily.    . vitamin E 400 UNIT capsule Take 400 Units by mouth daily.    Marland Kitchen zinc gluconate 50 MG tablet Take 50 mg by mouth daily.    . cloNIDine (CATAPRES) 0.1  MG tablet TAKE 1 TABLET BY MOUTH THREE TIMES DAILY AS NEEDED FOR HIGH BLOOD PRESSURE (Patient not taking: Reported on 10/29/2019) 90 tablet 3  . EPINEPHrine 0.3 mg/0.3 mL IJ SOAJ injection Inject 0.3 mg into the muscle as needed for anaphylaxis.  (Patient not taking: Reported on 10/29/2019)     No current facility-administered medications on file prior to visit.   Allergies  Allergen Reactions  . Ciprofloxacin Shortness Of Breath  . Latex Other (See Comments)    Burns skin   Social History   Socioeconomic History  . Marital status: Single    Spouse name: Not on file  . Number of children: 3  . Years of education: Not on file  . Highest education level: Not on file  Occupational History  . Occupation: English as a second language teacher: [employer]  Tobacco Use  . Smoking status: Former Smoker    Packs/day: 0.25    Years: 35.00    Pack years: 8.75    Types: Cigarettes    Quit date: 12/31/2012    Years since quitting: 6.8  . Smokeless tobacco: Never Used  Vaping Use  . Vaping Use: Never used  Substance and Sexual Activity  . Alcohol use: Not Currently    Comment: occasionaly  . Drug use: No  . Sexual activity: Not Currently    Partners: Male    Birth control/protection: Post-menopausal,  Surgical    Comment: tubal  Other Topics Concern  . Not on file  Social History Narrative   Eats all food groups.    Wears seatbelt.    Has 3 children.   Prior smoker.   Attends church.    Used to work in [employer] fundraiser.    Drives.   Enjoys going out with family.    Social Determinants of Health   Financial Resource Strain: Low Risk   . Difficulty of Paying Living Expenses: Not very hard  Food Insecurity:   . Worried About Charity fundraiser in the Last Year:   . Arboriculturist in the Last Year:   Transportation Needs:   . Film/video editor (Medical):   Marland Kitchen Lack of Transportation (Non-Medical):   Physical Activity:   . Days of Exercise per Week:   . Minutes of Exercise per Session:   Stress:   . Feeling of Stress :   Social Connections:   . Frequency of Communication with Friends and Family:   . Frequency of Social Gatherings with Friends and Family:   . Attends Religious Services:   . Active Member of Clubs or Organizations:   . Attends Archivist Meetings:   Marland Kitchen Marital Status:   Intimate Partner Violence:   . Fear of Current or Ex-Partner:   . Emotionally Abused:   Marland Kitchen Physically Abused:   . Sexually Abused:    Family History  Problem Relation Age of Onset  . Diabetes Other   . Diabetes Mother   . Heart disease Mother   . Heart disease Father   . Diabetes Sister   . Diabetes Brother   . Hypertension Brother   . Diabetes Daughter   . Hypertension Maternal Grandmother   . Stroke Maternal Grandfather   . Early death Paternal Grandfather   . Colon cancer Neg Hx   . Liver disease Neg Hx       Review of Systems  All other systems reviewed and are negative.      Objective:   Physical Exam Vitals reviewed.  Constitutional:  Appearance: She is well-developed.  Cardiovascular:     Rate and Rhythm: Normal rate and regular rhythm.     Heart sounds: Normal heart sounds. No murmur heard.  No friction rub. No gallop.   Pulmonary:     Effort:  Pulmonary effort is normal. No respiratory distress.     Breath sounds: Normal breath sounds. No stridor. No wheezing or rales.  Chest:     Chest wall: No tenderness.  Musculoskeletal:     Right hand: Normal. No swelling, deformity, tenderness or bony tenderness. Normal range of motion.     Left hand: Normal. No swelling, deformity, tenderness or bony tenderness. Normal range of motion.  Neurological:     Motor: No abnormal muscle tone.           Assessment & Plan:  Chronic fatigue - Plan: Vitamin B12, TSH, BASIC METABOLIC PANEL WITH GFR  I believe her fatigue could be due to hypotension.  I will check a TSH and a B12 level.  Discontinue hydrochlorothiazide due to hypotension as well as her recent hypokalemia and recheck a BMP to monitor her potassium level.  Recheck via telephone in 1 week to see if her symptoms are improving on less medication.  Is also possible that the beta-blocker metoprolol could be causing fatigue

## 2019-10-30 LAB — TSH: TSH: 1.84 mIU/L (ref 0.40–4.50)

## 2019-10-30 LAB — VITAMIN B12: Vitamin B-12: >2000 pg/mL — ABNORMAL HIGH (ref 200–1100)

## 2019-10-30 LAB — BASIC METABOLIC PANEL WITH GFR
BUN: 8 mg/dL (ref 7–25)
CO2: 28 mmol/L (ref 20–32)
Calcium: 9.7 mg/dL (ref 8.6–10.4)
Chloride: 104 mmol/L (ref 98–110)
Creat: 0.87 mg/dL (ref 0.50–0.99)
GFR, Est African American: 81 mL/min/{1.73_m2} (ref >=60)
GFR, Est Non African American: 70 mL/min/{1.73_m2} (ref >=60)
Glucose, Bld: 96 mg/dL (ref 65–99)
Potassium: 4.2 mmol/L (ref 3.5–5.3)
Sodium: 141 mmol/L (ref 135–146)

## 2019-11-13 ENCOUNTER — Encounter: Payer: Self-pay | Admitting: Gastroenterology

## 2019-11-13 NOTE — Progress Notes (Signed)
Referring Provider: Susy Frizzle, MD Primary Care Physician:  Susy Frizzle, MD Primary GI Physician: Dr. Gala Romney  Chief Complaint  Patient presents with  . Gastroesophageal Reflux    nausea at times  . Constipation    takes her linzess to help have a BM    HPI:   [name] is a 64 y.o. female presenting today with a history of [name] is a 64 y.o. female with history of constipation, GERD, and dysphagia s/p dilation in 2019.  EGD at that time revealed mild Schatzki ring s/p dilation and mild erosive gastropathy.  Colonoscopy up-to-date and due for repeat in 2022.    She presents today for follow-up of constipation, GERD, nausea without vomiting, and RUQ pain. Last seen in our office 08/23/2019 for the same.  She reported daily nausea over the last week.  Historically, nausea had improved with increasing Protonix to twice daily.  No vomiting.  Nausea triggered by meals.  Also with "knot" in RUQ that was mobile and tender, present for> 6 months, intermittent burning sensation in the epigastric area about twice a week, but no abdominal pain specifically with nausea.  No typical GERD symptoms.  Admitted to early satiety that had been present for more than 1 year.  Weight stable. Denied NSAIDs.  Per review of chart, delayed gastric emptying noted in 2004. Constipation well controlled with Linzess 145 mcg daily.  Plan included RUQ ultrasound, stop Protonix and trial Dexilant 60 mg daily, samples provided with request of 1-2-week progress report, update GES, Zofran as needed, continue Linzess, follow-up in 3 months.  RUQ ultrasound: S/p cholecystectomy, stable simple right renal cyst, otherwise unremarkable exam. Gastric emptying study: Normal exam with 95% emptied at 3 hours. Per GES result note, patient tried Dexilant x5 days which caused diarrhea.  She had stopped Resumed Protonix twice daily on 4/23.  She is still feeling somewhat nauseous but overall feeling better  and was not having any diarrhea or vomiting.  She preferred to continue Protonix with Linzess.  Today:  Nausea without vomiting/early satiety: Has been going pretty well. Has only had about 2 episodes of nausea since I saw her last. No vomiting. No significant changes in appetite. Weight is stable. Eating within 3 hours of going to bed. Thinks this may be contributing to her feeling nauseated at times. Tries to avoid this.   Chronic intermittent epigastric pain. States it is a quick sharp pain. Doesn't linger. Can be gone for months at a time then will come back for a few days. Resolves on its own. Triggered by twisting at times. Not triggered by eating. No NSAIDs.   RUQ pain has resolved.   GERD: Occasional breakthrough symptoms if she takes Protonix too late in the morning. Less than once a week. Doing her best to take Protonix 30 minutes before meals.   No dysphagia symptoms.   Constipation: Well controlled on Linzess. No blood in the stool or black stool.   Past Medical History:  Diagnosis Date  . Abdominal aortic aneurysm (AAA) 3.0 cm to 5.0 cm in diameter in female [facility])    None seen on prior imaging?   Marland Kitchen Anxiety   . Cataract   . Chronic left shoulder pain   . Headache   . Hiatal hernia    small  . Hyperlipidemia   . Hypertension   . Pre-diabetes   . Schatzki's ring   . Tremor     Past Surgical History:  Procedure  Laterality Date  . CARDIAC CATHETERIZATION  2005   "Ms. Yousuf has essentially normal coronary arteries and normal left ventricular function.  She will be treated empirically with anti-reflux measures"  . CATARACT EXTRACTION, BILATERAL    . CHOLECYSTECTOMY    . COLONOSCOPY    06/16/2004   HER:DEYCXK rectum/Diminutive polyps of the splenic flexure and the cecum/The remainder of the colonic mucosa appeared normal pathology (hyperplastic polyps).  . COLONOSCOPY N/A 08/07/2015   Dr. Gala Romney: 6 mm descending colon tubular adenoma, multiple medium-mouthed  diverticula in sigmoid colon. surveillance 2022  . ESOPHAGOGASTRODUODENOSCOPY  09/11/2001   GYJ:[id number] ring otherwise normal esophagus/Normal stomach/Normal D1 and D2 status post passage of a 56 [place] Maloney dilator  . ESOPHAGOGASTRODUODENOSCOPY N/A 11/11/2017   Procedure: ESOPHAGOGASTRODUODENOSCOPY (EGD);  Surgeon: Daneil Dolin, MD; mild Schatzki ring s/p dilation and mild erosive gastropathy.  . ESOPHAGOGASTRODUODENOSCOPY (EGD) WITH ESOPHAGEAL DILATION N/A 01/25/2013   Dr. Gala Romney- schatzki's ring- 75 F dilation. small hiatal hernia  . HYSTEROSCOPY WITH D & C N/A 03/06/2019   Procedure: DILATATION AND CURETTAGE /HYSTEROSCOPY;  Surgeon: Jonnie Kind, MD;  Location: [facility];  Service: Gynecology;  Laterality: N/A;  . [name] DILATION N/A 11/11/2017   Procedure: [name] DILATION;  Surgeon: Daneil Dolin, MD;  Location: [facility];  Service: Endoscopy;  Laterality: N/A;  . POLYPECTOMY N/A 03/06/2019   Procedure: POLYPECTOMY ( REMOVAL OF ENDOMETRIAL POLYP);  Surgeon: Jonnie Kind, MD;  Location: [facility];  Service: Gynecology;  Laterality: N/A;  . TUBAL LIGATION      Current Outpatient Medications  Medication Sig Dispense Refill  . acetaminophen (TYLENOL) 500 MG tablet Take 500 mg by mouth every 6 (six) hours as needed for moderate pain or headache.    . ALPRAZolam (XANAX) 0.5 MG tablet TAKE 1 TABLET(0.5 MG) BY MOUTH TWICE DAILY AS NEEDED FOR ANXIETY (Patient taking differently: Take 0.5 mg by mouth 2 (two) times daily as needed for anxiety. ) 60 tablet 0  . amLODipine (NORVASC) 10 MG tablet TAKE 1 TABLET(10 MG) BY MOUTH AT BEDTIME (Patient taking differently: Take 10 mg by mouth at bedtime. ) 90 tablet 3  . aspirin EC 81 MG tablet Take 81 mg by mouth at bedtime.     . cholecalciferol (VITAMIN D3) 25 MCG (1000 UT) tablet Take 2,000 Units by mouth daily.    . cloNIDine (CATAPRES) 0.1 MG tablet TAKE 1 TABLET BY MOUTH THREE TIMES DAILY AS NEEDED FOR HIGH BLOOD PRESSURE 90 tablet 3  .  Cyanocobalamin (B-12) 2500 MCG TABS Take 2,500 mcg by mouth daily.    Marland Kitchen EPINEPHrine 0.3 mg/0.3 mL IJ SOAJ injection Inject 0.3 mg into the muscle as needed for anaphylaxis.     Marland Kitchen escitalopram (LEXAPRO) 10 MG tablet Take 1 tablet (10 mg total) by mouth daily. 30 tablet 5  . fluticasone (FLONASE) 50 MCG/ACT nasal spray Place 2 sprays into both nostrils daily. 16 g 6  . hydrOXYzine (ATARAX/VISTARIL) 25 MG tablet Take 0.5-1 tablets (12.5-25 mg total) by mouth every 8 (eight) hours as needed for itching. 20 tablet 0  . ipratropium (ATROVENT) 0.03 % nasal spray USE 2 SPRAYS IN EACH NOSTRIL EVERY 12 HOURS (Patient taking differently: Place 2 sprays into both nostrils See admin instructions. Instill 2 sprays into each nostril each morning ,may do a second dose later as needed for allergies) 30 mL 12  . LINZESS 145 MCG CAPS capsule TAKE 1 CAPSULE(145 MCG) BY MOUTH DAILY BEFORE BREAKFAST (Patient taking differently: Take 145 mcg by  mouth daily before breakfast. ) 30 capsule 3  . loratadine (CLARITIN) 10 MG tablet Take 10 mg by mouth daily as needed for allergies.    Marland Kitchen MAGNESIUM PO Take 330 mg by mouth daily.    . metoprolol succinate (TOPROL-XL) 100 MG 24 hr tablet Take 1 tablet (100 mg total) by mouth every evening. Take with or immediately following a meal. 90 tablet 3  . mometasone (ELOCON) 0.1 % cream Apply 1 application topically daily as needed (for irritation).     . montelukast (SINGULAIR) 10 MG tablet TAKE 1 TABLET(10 MG) BY MOUTH AT BEDTIME (Patient taking differently: Take 10 mg by mouth at bedtime. ) 30 tablet 3  . Multiple Vitamins-Minerals (ZINC PO) Take 1 tablet by mouth daily.    . ondansetron (ZOFRAN) 4 MG tablet Take 1 tablet (4 mg total) by mouth every 8 (eight) hours as needed for nausea or vomiting. 30 tablet 1  . pantoprazole (PROTONIX) 40 MG tablet TAKE 1 TABLET BY MOUTH TWICE DAILY. TAKE 30 MINUTES BEFORE MEALS (Patient taking differently: Take 40 mg by mouth 2 (two) times daily before a  meal. ) 60 tablet 5  . potassium chloride SA (KLOR-CON) 20 MEQ tablet TAKE 1 TABLET(20 MEQ) BY MOUTH DAILY (Patient taking differently: Take 20 mEq by mouth daily. ) 30 tablet 5  . rosuvastatin (CRESTOR) 5 MG tablet Take 1 tablet (5 mg total) by mouth daily. 90 tablet 3  . sodium chloride (OCEAN) 0.65 % SOLN nasal spray Place 1 spray into both nostrils as needed for congestion.    . tacrolimus (PROTOPIC) 0.1 % ointment Apply 1 application topically daily as needed (for irritation).     . triamcinolone cream (KENALOG) 0.1 % APPLY EXTERNALLY TO THE AFFECTED AREA TWICE DAILY (Patient taking differently: Apply 1 application topically 2 (two) times daily as needed (Rash). ) 45 g 0  . valsartan (DIOVAN) 320 MG tablet Take 1 tablet (320 mg total) by mouth daily. 90 tablet 1  . vitamin C (ASCORBIC ACID) 250 MG tablet Take 500 mg by mouth daily.    . vitamin E 400 UNIT capsule Take 400 Units by mouth daily.    Marland Kitchen zinc gluconate 50 MG tablet Take 50 mg by mouth daily.    . hydrochlorothiazide (HYDRODIURIL) 25 MG tablet TAKE 1 TABLET(25 MG) BY MOUTH DAILY (Patient not taking: Reported on 11/14/2019) 90 tablet 2   No current facility-administered medications for this visit.    Allergies as of 11/14/2019 - Review Complete 11/14/2019  Allergen Reaction Noted  . Ciprofloxacin Shortness Of Breath 12/27/2011  . Latex Other (See Comments) 10/17/2019    Family History  Problem Relation Age of Onset  . Diabetes Other   . Diabetes Mother   . Heart disease Mother   . Heart disease Father   . Diabetes Sister   . Diabetes Brother   . Hypertension Brother   . Diabetes Daughter   . Hypertension Maternal Grandmother   . Stroke Maternal Grandfather   . Early death Paternal Grandfather   . Colon cancer Neg Hx   . Liver disease Neg Hx     Social History   Socioeconomic History  . Marital status: Single    Spouse name: Not on file  . Number of children: 3  . Years of education: Not on file  . Highest  education level: Not on file  Occupational History  . Occupation: English as a second language teacher: [employer]  Tobacco Use  . Smoking status: Former Smoker  Packs/day: 0.25    Years: 35.00    Pack years: 8.75    Types: Cigarettes    Quit date: 12/31/2012    Years since quitting: 6.8  . Smokeless tobacco: Never Used  Vaping Use  . Vaping Use: Never used  Substance and Sexual Activity  . Alcohol use: Not Currently    Comment: occasionaly  . Drug use: No  . Sexual activity: Not Currently    Partners: Male    Birth control/protection: Post-menopausal, Surgical    Comment: tubal  Other Topics Concern  . Not on file  Social History Narrative   Eats all food groups.    Wears seatbelt.    Has 3 children.   Prior smoker.   Attends church.    Used to work in Charity fundraiser.    Drives.   Enjoys going out with family.    Social Determinants of Health   Financial Resource Strain: Low Risk   . Difficulty of Paying Living Expenses: Not very hard  Food Insecurity:   . Worried About Charity fundraiser in the Last Year:   . Arboriculturist in the Last Year:   Transportation Needs:   . Film/video editor (Medical):   Marland Kitchen Lack of Transportation (Non-Medical):   Physical Activity:   . Days of Exercise per Week:   . Minutes of Exercise per Session:   Stress:   . Feeling of Stress :   Social Connections:   . Frequency of Communication with Friends and Family:   . Frequency of Social Gatherings with Friends and Family:   . Attends Religious Services:   . Active Member of Clubs or Organizations:   . Attends Archivist Meetings:   Marland Kitchen Marital Status:     Review of Systems: Gen: Denies fever, chills, cold or flulike symptoms, lightheadedness, dizziness, presyncope, syncope. CV: Denies chest pain. Occasional palpitations.  Resp: Denies dyspnea or cough.  GI: See HPI Heme: See HPI  Physical Exam: BP (!) 159/79   Pulse 68   Temp 97.9 F (36.6 C) (Oral)   Ht 5\' 7"   (1.702 m)   Wt 175 lb (79.4 kg)   BMI 27.41 kg/m  General:   Alert and oriented. No distress noted. Pleasant and cooperative.  Head:  Normocephalic and atraumatic. Eyes:  Conjuctiva clear without scleral icterus.Marland Kitchen Heart:  S1, S2 present without murmurs appreciated. Lungs:  Clear to auscultation bilaterally. No wheezes, rales, or rhonchi. No distress.  Abdomen:  +BS, soft, non-tender and non-distended. No rebound or guarding. No HSM or masses noted. Msk:  Symmetrical without gross deformities. Normal posture. Extremities:  Without edema. Neurologic:  Alert and  oriented x4 Psych:  Normal mood and affect.

## 2019-11-14 ENCOUNTER — Other Ambulatory Visit: Payer: Self-pay

## 2019-11-14 ENCOUNTER — Ambulatory Visit: Payer: Medicare Other | Admitting: Gastroenterology

## 2019-11-14 ENCOUNTER — Encounter: Payer: Self-pay | Admitting: Gastroenterology

## 2019-11-14 VITALS — BP 159/79 | HR 68 | Temp 97.9°F | Ht 67.0 in | Wt 175.0 lb

## 2019-11-14 DIAGNOSIS — K219 Gastro-esophageal reflux disease without esophagitis: Secondary | ICD-10-CM | POA: Diagnosis not present

## 2019-11-14 DIAGNOSIS — R110 Nausea: Secondary | ICD-10-CM

## 2019-11-14 DIAGNOSIS — R1013 Epigastric pain: Secondary | ICD-10-CM | POA: Diagnosis not present

## 2019-11-14 DIAGNOSIS — K5900 Constipation, unspecified: Secondary | ICD-10-CM

## 2019-11-14 NOTE — Assessment & Plan Note (Signed)
Fairly well controlled on Protonix 40 mg twice daily.  Rare breakthrough symptoms when taking Protonix to light in the morning.  No dysphagia symptoms.  She does have occasional nausea which I suspect may be related to GERD/eating within 3 hours of going to bed as she does occasionally waking in the morning with nausea.  Continue Protonix 40 mg twice daily 30 minutes before breakfast and dinner. Follow a GERD diet:  Avoid fried, fatty, greasy, spicy, citrus foods. Avoid caffeine and carbonated beverages. Avoid chocolate. Try eating 4-6 small meals a day rather than 3 large meals. Do not eat within 3 hours of laying down. Prop head of bed up on wood or bricks to create a 6 inch incline. Follow-up in 6 months.

## 2019-11-14 NOTE — Assessment & Plan Note (Signed)
Chronic.  Well-controlled on Linzess 145 mcg daily.  No alarm symptoms.  Colonoscopy up-to-date in 2017 due for repeat in 2022.  Continue Linzess 145 mcg daily. Follow-up in 6 months.

## 2019-11-14 NOTE — Patient Instructions (Addendum)
Continue Protonix 40 mg twice daily 30 minutes before breakfast and dinner.   Follow a GERD diet:  Avoid fried, fatty, greasy, spicy, citrus foods. Avoid caffeine and carbonated beverages. Avoid chocolate. Try eating 4-6 small meals a day rather than 3 large meals. Do not eat within 3 hours of laying down. Prop head of bed up on wood or bricks to create a 6 inch incline.  Continue Linzess 145 mcg daily 30 minutes before first meal.   We will follow-up in 6 months. Do not hesitate to call with questions or concerns prior.   [name], PA-C [facility]    Food Choices for Gastroesophageal Reflux Disease, Adult When you have gastroesophageal reflux disease (GERD), the foods you eat and your eating habits are very important. Choosing the right foods can help ease the discomfort of GERD. Consider working with a diet and nutrition specialist (dietitian) to help you make healthy food choices. What general guidelines should I follow?  Eating plan  Choose healthy foods low in fat, such as fruits, vegetables, whole grains, low-fat dairy products, and lean meat, fish, and poultry.  Eat frequent, small meals instead of three large meals each day. Eat your meals slowly, in a relaxed setting. Avoid bending over or lying down until 2-3 hours after eating.  Limit high-fat foods such as fatty meats or fried foods.  Limit your intake of oils, butter, and shortening to less than 8 teaspoons each day.  Avoid the following: ? Foods that cause symptoms. These may be different for different people. Keep a food diary to keep track of foods that cause symptoms. ? Alcohol. ? Drinking large amounts of liquid with meals. ? Eating meals during the 2-3 hours before bed.  Cook foods using methods other than frying. This may include baking, grilling, or broiling. Lifestyle  Maintain a healthy weight. Ask your health care provider what weight is healthy for you. If you need to lose  weight, work with your health care provider to do so safely.  Exercise for at least 30 minutes on 5 or more days each week, or as told by your health care provider.  Avoid wearing clothes that fit tightly around your waist and chest.  Do not use any products that contain nicotine or tobacco, such as cigarettes and e-cigarettes. If you need help quitting, ask your health care provider.  Sleep with the head of your bed raised. Use a wedge under the mattress or blocks under the bed frame to raise the head of the bed. What foods are not recommended? The items listed may not be a complete list. Talk with your dietitian about what dietary choices are best for you. Grains Pastries or quick breads with added fat. [place] toast. Vegetables Deep fried vegetables. [place] fries. Any vegetables prepared with added fat. Any vegetables that cause symptoms. For some people this may include tomatoes and tomato products, chili peppers, onions and garlic, and horseradish. Fruits Any fruits prepared with added fat. Any fruits that cause symptoms. For some people this may include citrus fruits, such as oranges, grapefruit, pineapple, and lemons. Meats and other protein foods High-fat meats, such as fatty beef or pork, hot dogs, ribs, ham, sausage, salami and bacon. Fried meat or protein, including fried fish and fried chicken. Nuts and nut butters. Dairy Whole milk and chocolate milk. Sour cream. Cream. Ice cream. Cream cheese. Milk shakes. Beverages Coffee and tea, with or without caffeine. Carbonated beverages. Sodas. Energy drinks. Fruit juice made with acidic fruits (such  as orange or grapefruit). Tomato juice. Alcoholic drinks. Fats and oils Butter. Margarine. Shortening. Ghee. Sweets and desserts Chocolate and cocoa. Donuts. Seasoning and other foods Pepper. Peppermint and spearmint. Any condiments, herbs, or seasonings that cause symptoms. For some people, this may include curry, hot sauce, or  vinegar-based salad dressings. Summary  When you have gastroesophageal reflux disease (GERD), food and lifestyle choices are very important to help ease the discomfort of GERD.  Eat frequent, small meals instead of three large meals each day. Eat your meals slowly, in a relaxed setting. Avoid bending over or lying down until 2-3 hours after eating.  Limit high-fat foods such as fatty meat or fried foods. This information is not intended to replace advice given to you by your health care provider. Make sure you discuss any questions you have with your health care provider. Document Revised: 08/17/2018 Document Reviewed: 04/27/2016 Elsevier Patient Education  [place].

## 2019-11-14 NOTE — Assessment & Plan Note (Addendum)
Intermittent nausea without vomiting.  At her last visit in April 2021, nausea had become more frequent.  Tried changing Protonix to [employer] as she previously had noted improvement in nausea with PPI adjustments.  Dexilant ended up causing diarrhea and patient resumed Protonix.  She reports only having 2 episodes of nausea over the last 3 months.  Also reports chronic history of early satiety and intermittent epigastric pain that that may occur every few months to a couple times a week lasting seconds at a time likely MSK in etiology. Weight is stable and typical GERD symptoms are fairly well controlled.  Patient reports mild intermittent nausea, early satiety, and intermittent epigastric pain have been present for years and were present during last EGD in 2019 which revealed mild erosive gastropathy.  Updated ultrasound and gastric emptying study in April 2021 and both were unrevealing. 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  Abdominal exam benign today.  Suspect mild intermittent nausea is likely related to GERD.  As symptoms are very minimal, will continue with current medications.  Continue Protonix 40 mg twice daily 30 minutes before breakfast and dinner. Follow a GERD diet:  Avoid fried, fatty, greasy, spicy, citrus foods. Avoid caffeine and carbonated beverages. Avoid chocolate. Try eating 4-6 small meals a day rather than 3 large meals. Do not eat within 3 hours of laying down. Prop head of bed up on wood or bricks to create a 6 inch incline. Follow-up in 6 months.  Call with questions or concerns prior.

## 2019-11-14 NOTE — Assessment & Plan Note (Addendum)
Chronic intermittent epigastric pain present for years.  Symptoms may occur once every few months to a couple times a week.  Lasts for seconds.  Triggered by twisting or lying certain ways.  No association with meals.  Denies NSAIDs.  Symptoms present at last EGD in 2019 which revealed mild erosive gastropathy.  No significant change in symptoms.  She has maintained on PPI twice daily which has kept GERD symptoms well controlled. Abdominal exam benign today. Overall, suspect MSK etiology.  Continue to monitor. Follow-up in 6 months.

## 2019-11-20 ENCOUNTER — Other Ambulatory Visit: Payer: Self-pay | Admitting: Family Medicine

## 2019-11-20 DIAGNOSIS — F419 Anxiety disorder, unspecified: Secondary | ICD-10-CM

## 2019-11-20 NOTE — Telephone Encounter (Signed)
Ok to refill??  Last office visit 10/29/2019  Last refill 09/11/2019.

## 2019-12-04 ENCOUNTER — Encounter: Payer: Self-pay | Admitting: Family Medicine

## 2019-12-04 ENCOUNTER — Other Ambulatory Visit: Payer: Self-pay

## 2019-12-04 ENCOUNTER — Ambulatory Visit (INDEPENDENT_AMBULATORY_CARE_PROVIDER_SITE_OTHER): Payer: Medicare Other | Admitting: Family Medicine

## 2019-12-04 VITALS — BP 144/78 | HR 64 | Temp 97.2°F | Ht 67.0 in | Wt 172.0 lb

## 2019-12-04 DIAGNOSIS — R270 Ataxia, unspecified: Secondary | ICD-10-CM

## 2019-12-04 MED ORDER — AMOXICILLIN 875 MG PO TABS
875.0000 mg | ORAL_TABLET | Freq: Two times a day (BID) | ORAL | 0 refills | Status: DC
Start: 2019-12-04 — End: 2019-12-31

## 2019-12-04 NOTE — Progress Notes (Signed)
Subjective:    Patient ID: [name], female    DOB: 1955/08/12, 64 y.o.   MRN: [id number]  Hypertension Associated symptoms include headaches.  Headache  Her past medical history is significant for hypertension.   10/2019 Patient reports feeling extremely tired with no energy.  She has been checking her blood pressure frequently at home over the last month.  Her systolic blood pressures average between 98 and 140 with the vast majority between 100-110.  Her diastolic blood pressures are between 50 and 60.  She does feel lightheaded.  She denies any chest pain shortness of breath dyspnea on exertion.  She is on metoprolol, amlodipine, hydrochlorothiazide, and valsartan.  She seldom uses clonidine on an as-needed basis for high blood pressure.  Recently she was at the emergency room where a BMP and a CBC were normal except for low potassium.  At that time, my plan was: I believe her fatigue could be due to hypotension.  I will check a TSH and a B12 level.  Discontinue hydrochlorothiazide due to hypotension as well as her recent hypokalemia and recheck a BMP to monitor her potassium level.  Recheck via telephone in 1 week to see if her symptoms are improving on less medication.  Is also possible that the beta-blocker metoprolol could be causing fatigue  12/04/19 Patient is here today reporting dizziness.  She states that over the last 2 weeks, she has been staggering at home.  She provides the example of walking with her sister.  She states that without paying attention she was staggering walking to her sister unless she made a conscious effort to maintain walking on a straight line.  She feels off balance constantly.  Dix-Hallpike maneuver is negative today.  She denies any vertigo.  Cerebellar testing is normal in the hallway as she is able to perform heel-to-toe walk without difficulty.  Cranial nerves II through XII are grossly intact with muscle strength 5/5 equal and symmetric in the upper  and lower extremities.  She does have a resting pill-rolling type tremor in both arms that is made worse with activity.  This is a chronic tremor that she has had for quite some time.  There is no obvious neurologic deficit today on exam.  Romberg testing is normal.  However the patient reports a moderate intensity occipital headache that is been constant over the last week.  She also reports some sinus pain.  She reports pain and pressure in her frontal sinuses for the last week.  She reports nasal congestion and obstruction of her nasal passages.  Differential diagnosis would be damage to the cerebellum from possibly a stroke versus atypical sinusitis Past Medical History:  Diagnosis Date  . Abdominal aortic aneurysm (AAA) 3.0 cm to 5.0 cm in diameter in female [facility])    None seen on prior imaging?   Marland Kitchen Anxiety   . Cataract   . Chronic left shoulder pain   . Headache   . Hiatal hernia    small  . Hyperlipidemia   . Hypertension   . Pre-diabetes   . Schatzki's ring   . Tremor    Past Surgical History:  Procedure Laterality Date  . CARDIAC CATHETERIZATION  2005   "Ms. Beam has essentially normal coronary arteries and normal left ventricular function.  She will be treated empirically with anti-reflux measures"  . CATARACT EXTRACTION, BILATERAL    . CHOLECYSTECTOMY    . COLONOSCOPY    06/16/2004   [facility]:DQQIWL rectum/Diminutive polyps of the  splenic flexure and the cecum/The remainder of the colonic mucosa appeared normal pathology (hyperplastic polyps).  . COLONOSCOPY N/A 08/07/2015   Dr. Gala Romney: 6 mm descending colon tubular adenoma, multiple medium-mouthed diverticula in sigmoid colon. surveillance 2022  . ESOPHAGOGASTRODUODENOSCOPY  09/11/2001   ESP:[id number] ring otherwise normal esophagus/Normal stomach/Normal D1 and D2 status post passage of a 56 [place] Maloney dilator  . ESOPHAGOGASTRODUODENOSCOPY N/A 11/11/2017   Procedure: ESOPHAGOGASTRODUODENOSCOPY (EGD);  Surgeon: Daneil Dolin, MD; mild Schatzki ring s/p dilation and mild erosive gastropathy.  . ESOPHAGOGASTRODUODENOSCOPY (EGD) WITH ESOPHAGEAL DILATION N/A 01/25/2013   Dr. Gala Romney- schatzki's ring- 59 F dilation. small hiatal hernia  . HYSTEROSCOPY WITH D & C N/A 03/06/2019   Procedure: DILATATION AND CURETTAGE /HYSTEROSCOPY;  Surgeon: Jonnie Kind, MD;  Location: [facility];  Service: Gynecology;  Laterality: N/A;  . [name] DILATION N/A 11/11/2017   Procedure: [name] DILATION;  Surgeon: Daneil Dolin, MD;  Location: [facility] SUITE;  Service: Endoscopy;  Laterality: N/A;  . POLYPECTOMY N/A 03/06/2019   Procedure: POLYPECTOMY ( REMOVAL OF ENDOMETRIAL POLYP);  Surgeon: Jonnie Kind, MD;  Location: [facility];  Service: Gynecology;  Laterality: N/A;  . TUBAL LIGATION     Current Outpatient Medications on File Prior to Visit  Medication Sig Dispense Refill  . acetaminophen (TYLENOL) 500 MG tablet Take 500 mg by mouth every 6 (six) hours as needed for moderate pain or headache.    . ALPRAZolam (XANAX) 0.5 MG tablet TAKE 1 TABLET(0.5 MG) BY MOUTH TWICE DAILY AS NEEDED FOR ANXIETY 60 tablet 0  . amLODipine (NORVASC) 10 MG tablet TAKE 1 TABLET(10 MG) BY MOUTH AT BEDTIME (Patient taking differently: Take 10 mg by mouth at bedtime. ) 90 tablet 3  . aspirin EC 81 MG tablet Take 81 mg by mouth at bedtime.     . cholecalciferol (VITAMIN D3) 25 MCG (1000 UT) tablet Take 2,000 Units by mouth daily.    . cloNIDine (CATAPRES) 0.1 MG tablet TAKE 1 TABLET BY MOUTH THREE TIMES DAILY AS NEEDED FOR HIGH BLOOD PRESSURE 90 tablet 3  . Cyanocobalamin (B-12) 2500 MCG TABS Take 2,500 mcg by mouth daily.    Marland Kitchen EPINEPHrine 0.3 mg/0.3 mL IJ SOAJ injection Inject 0.3 mg into the muscle as needed for anaphylaxis.     Marland Kitchen escitalopram (LEXAPRO) 10 MG tablet Take 1 tablet (10 mg total) by mouth daily. 30 tablet 5  . fluticasone (FLONASE) 50 MCG/ACT nasal spray Place 2 sprays into both nostrils daily. 16 g 6  . hydrOXYzine (ATARAX/VISTARIL) 25 MG tablet  Take 0.5-1 tablets (12.5-25 mg total) by mouth every 8 (eight) hours as needed for itching. 20 tablet 0  . ipratropium (ATROVENT) 0.03 % nasal spray USE 2 SPRAYS IN EACH NOSTRIL EVERY 12 HOURS (Patient taking differently: Place 2 sprays into both nostrils See admin instructions. Instill 2 sprays into each nostril each morning ,may do a second dose later as needed for allergies) 30 mL 12  . LINZESS 145 MCG CAPS capsule TAKE 1 CAPSULE(145 MCG) BY MOUTH DAILY BEFORE BREAKFAST (Patient taking differently: Take 145 mcg by mouth daily before breakfast. ) 30 capsule 3  . loratadine (CLARITIN) 10 MG tablet Take 10 mg by mouth daily as needed for allergies.    Marland Kitchen MAGNESIUM PO Take 330 mg by mouth daily.    . metoprolol succinate (TOPROL-XL) 100 MG 24 hr tablet Take 1 tablet (100 mg total) by mouth every evening. Take with or immediately following a meal. 90 tablet 3  .  mometasone (ELOCON) 0.1 % cream Apply 1 application topically daily as needed (for irritation).     . montelukast (SINGULAIR) 10 MG tablet TAKE 1 TABLET(10 MG) BY MOUTH AT BEDTIME (Patient taking differently: Take 10 mg by mouth at bedtime. ) 30 tablet 3  . Multiple Vitamins-Minerals (ZINC PO) Take 1 tablet by mouth daily.    . ondansetron (ZOFRAN) 4 MG tablet Take 1 tablet (4 mg total) by mouth every 8 (eight) hours as needed for nausea or vomiting. 30 tablet 1  . pantoprazole (PROTONIX) 40 MG tablet TAKE 1 TABLET BY MOUTH TWICE DAILY. TAKE 30 MINUTES BEFORE MEALS (Patient taking differently: Take 40 mg by mouth 2 (two) times daily before a meal. ) 60 tablet 5  . potassium chloride SA (KLOR-CON) 20 MEQ tablet TAKE 1 TABLET(20 MEQ) BY MOUTH DAILY (Patient taking differently: Take 20 mEq by mouth daily. ) 30 tablet 5  . rosuvastatin (CRESTOR) 5 MG tablet Take 1 tablet (5 mg total) by mouth daily. 90 tablet 3  . sodium chloride (OCEAN) 0.65 % SOLN nasal spray Place 1 spray into both nostrils as needed for congestion.    . tacrolimus (PROTOPIC) 0.1 %  ointment Apply 1 application topically daily as needed (for irritation).     . triamcinolone cream (KENALOG) 0.1 % APPLY EXTERNALLY TO THE AFFECTED AREA TWICE DAILY (Patient taking differently: Apply 1 application topically 2 (two) times daily as needed (Rash). ) 45 g 0  . valsartan (DIOVAN) 320 MG tablet Take 1 tablet (320 mg total) by mouth daily. 90 tablet 1  . vitamin C (ASCORBIC ACID) 250 MG tablet Take 500 mg by mouth daily.    . vitamin E 400 UNIT capsule Take 400 Units by mouth daily.    Marland Kitchen zinc gluconate 50 MG tablet Take 50 mg by mouth daily.     No current facility-administered medications on file prior to visit.   Allergies  Allergen Reactions  . Ciprofloxacin Shortness Of Breath  . Latex Other (See Comments)    Burns skin   Social History   Socioeconomic History  . Marital status: Single    Spouse name: Not on file  . Number of children: 3  . Years of education: Not on file  . Highest education level: Not on file  Occupational History  . Occupation: English as a second language teacher: [employer]  Tobacco Use  . Smoking status: Former Smoker    Packs/day: 0.25    Years: 35.00    Pack years: 8.75    Types: Cigarettes    Quit date: 12/31/2012    Years since quitting: 6.9  . Smokeless tobacco: Never Used  Vaping Use  . Vaping Use: Never used  Substance and Sexual Activity  . Alcohol use: Not Currently    Comment: occasionaly  . Drug use: No  . Sexual activity: Not Currently    Partners: Male    Birth control/protection: Post-menopausal, Surgical    Comment: tubal  Other Topics Concern  . Not on file  Social History Narrative   Eats all food groups.    Wears seatbelt.    Has 3 children.   Prior smoker.   Attends church.    Used to work in [employer].    Drives.   Enjoys going out with family.    Social Determinants of Health   Financial Resource Strain: Low Risk   . Difficulty of Paying Living Expenses: Not very hard  Food Insecurity:   . Worried About  Estate manager/land agent  of Food in the Last Year:   . [place] in the Last Year:   Transportation Needs:   . Lack of Transportation (Medical):   Marland Kitchen Lack of Transportation (Non-Medical):   Physical Activity:   . Days of Exercise per Week:   . Minutes of Exercise per Session:   Stress:   . Feeling of Stress :   Social Connections:   . Frequency of Communication with Friends and Family:   . Frequency of Social Gatherings with Friends and Family:   . Attends Religious Services:   . Active Member of Clubs or Organizations:   . Attends Archivist Meetings:   Marland Kitchen Marital Status:   Intimate Partner Violence:   . Fear of Current or Ex-Partner:   . Emotionally Abused:   Marland Kitchen Physically Abused:   . Sexually Abused:    Family History  Problem Relation Age of Onset  . Diabetes Other   . Diabetes Mother   . Heart disease Mother   . Heart disease Father   . Diabetes Sister   . Diabetes Brother   . Hypertension Brother   . Diabetes Daughter   . Hypertension Maternal Grandmother   . Stroke Maternal Grandfather   . Early death Paternal Grandfather   . Colon cancer Neg Hx   . Liver disease Neg Hx       Review of Systems  Neurological: Positive for headaches.  All other systems reviewed and are negative.      Objective:   Physical Exam Vitals reviewed.  Constitutional:      Appearance: She is well-developed.  Eyes:     General: No visual field deficit.    Extraocular Movements:     Right eye: Normal extraocular motion and no nystagmus.     Left eye: Normal extraocular motion and no nystagmus.     Pupils:     Right eye: Pupil is round and reactive.     Left eye: Pupil is round and reactive.  Cardiovascular:     Rate and Rhythm: Normal rate and regular rhythm.     Heart sounds: Normal heart sounds. No murmur heard.  No friction rub. No gallop.   Pulmonary:     Effort: Pulmonary effort is normal. No respiratory distress.     Breath sounds: Normal breath sounds. No stridor.  No wheezing or rales.  Chest:     Chest wall: No tenderness.  Musculoskeletal:     Right hand: Normal. No swelling, deformity, tenderness or bony tenderness. Normal range of motion.     Left hand: Normal. No swelling, deformity, tenderness or bony tenderness. Normal range of motion.     Cervical back: Normal range of motion. No rigidity.  Neurological:     Mental Status: She is alert and oriented to person, place, and time. Mental status is at baseline.     Cranial Nerves: No cranial nerve deficit, dysarthria or facial asymmetry.     Sensory: No sensory deficit.     Motor: No weakness or abnormal muscle tone.     Coordination: Romberg sign negative. Coordination normal.     Gait: Gait normal.           Assessment & Plan:  Ataxia - Plan: CT Head Wo Contrast  Patient complains of subjective ataxia over the last 2 to 3 weeks as well as an occipital headache.  She denies any history of vertigo.  She denies any history of head trauma.  She denies any family history  of personal history of migraines.  Therefore given her age, her history of hypertension, I will obtain a CT scan to evaluate for any evidence of a stroke or space-occupying lesion.  I will begin treating the patient empirically for a sinus infection although her symptoms are atypical for this as well.  Await the results of the CAT scan.  I am reassured today by her neurologic exam

## 2019-12-07 ENCOUNTER — Ambulatory Visit: Payer: Medicare Other

## 2019-12-07 ENCOUNTER — Ambulatory Visit (INDEPENDENT_AMBULATORY_CARE_PROVIDER_SITE_OTHER)
Admission: RE | Admit: 2019-12-07 | Discharge: 2019-12-07 | Disposition: A | Discharge status: Home / Self Care | Payer: Medicare Other | Source: Ambulatory Visit | Attending: Vascular Surgery | Admitting: Vascular Surgery

## 2019-12-07 ENCOUNTER — Encounter (HOSPITAL_COMMUNITY): Payer: Medicare Other

## 2019-12-07 ENCOUNTER — Other Ambulatory Visit: Payer: Self-pay

## 2019-12-07 ENCOUNTER — Ambulatory Visit (HOSPITAL_COMMUNITY)
Admission: RE | Admit: 2019-12-07 | Discharge: 2019-12-07 | Disposition: A | Discharge status: Home / Self Care | Payer: Medicare Other | Source: Ambulatory Visit | Attending: Vascular Surgery | Admitting: Vascular Surgery

## 2019-12-07 DIAGNOSIS — I6523 Occlusion and stenosis of bilateral carotid arteries: Secondary | ICD-10-CM

## 2019-12-07 DIAGNOSIS — I83893 Varicose veins of bilateral lower extremities with other complications: Secondary | ICD-10-CM | POA: Insufficient documentation

## 2019-12-10 ENCOUNTER — Other Ambulatory Visit (HOSPITAL_COMMUNITY): Payer: Self-pay | Admitting: Family Medicine

## 2019-12-10 DIAGNOSIS — Z1231 Encounter for screening mammogram for malignant neoplasm of breast: Secondary | ICD-10-CM

## 2019-12-12 ENCOUNTER — Other Ambulatory Visit: Payer: Self-pay

## 2019-12-12 ENCOUNTER — Ambulatory Visit (HOSPITAL_COMMUNITY)
Admission: RE | Admit: 2019-12-12 | Discharge: 2019-12-12 | Disposition: A | Discharge status: Home / Self Care | Payer: Medicare Other | Source: Ambulatory Visit | Attending: Physician Assistant | Admitting: Physician Assistant

## 2019-12-12 ENCOUNTER — Ambulatory Visit (INDEPENDENT_AMBULATORY_CARE_PROVIDER_SITE_OTHER): Payer: Medicare Other | Admitting: Physician Assistant

## 2019-12-12 ENCOUNTER — Other Ambulatory Visit: Payer: Self-pay | Admitting: Family Medicine

## 2019-12-12 VITALS — BP 153/78 | HR 63 | Temp 97.1°F | Resp 20 | Ht 67.0 in | Wt 173.3 lb

## 2019-12-12 DIAGNOSIS — I714 Abdominal aortic aneurysm, without rupture, unspecified: Secondary | ICD-10-CM

## 2019-12-12 DIAGNOSIS — H43811 Vitreous degeneration, right eye: Secondary | ICD-10-CM | POA: Diagnosis not present

## 2019-12-12 DIAGNOSIS — I6523 Occlusion and stenosis of bilateral carotid arteries: Secondary | ICD-10-CM | POA: Diagnosis not present

## 2019-12-12 DIAGNOSIS — I70213 Atherosclerosis of native arteries of extremities with intermittent claudication, bilateral legs: Secondary | ICD-10-CM

## 2019-12-12 DIAGNOSIS — I739 Peripheral vascular disease, unspecified: Secondary | ICD-10-CM

## 2019-12-12 DIAGNOSIS — I83893 Varicose veins of bilateral lower extremities with other complications: Secondary | ICD-10-CM | POA: Insufficient documentation

## 2019-12-12 DIAGNOSIS — E119 Type 2 diabetes mellitus without complications: Secondary | ICD-10-CM | POA: Diagnosis not present

## 2019-12-12 DIAGNOSIS — I10 Essential (primary) hypertension: Secondary | ICD-10-CM

## 2019-12-12 LAB — HM DIABETES EYE EXAM

## 2019-12-12 NOTE — Progress Notes (Signed)
Office Note     CC:  follow up Requesting Provider:  Susy Frizzle, MD  HPI: [name] is a 64 y.o. (Aug 07, 1955) female who presents for follow up of right greater than left leg swelling. She has had the swelling for several months now. It is usually worsened by prolonged sitting and/or standing. She additionally has some large varicose veins in right leg that are sore and tender to touch and also she will have some tightness in right calf on prolonged ambulation.The patient has had no history of DVT, + history of varicose vein, no history of venous stasis ulcers, no history of  Lymphedema and no history of skin changes in lower legs.  There is no family history of venous disorders.  When she was seen last on 10/18/19 she had duplex that was negative for DVT. She was measured and recommended for use of thigh high and knee high compression stockings. She has been wearing these daily since her visit in June. She has noticed only some improvement with use of these. She also has been elevation and she continues to walk daily for 45 minutes  She is also being followed for small AAA (2.5 cm) and bilateral carotid stenosis. She does not have any symptoms relatable to these at this time. She does not have any lower extremity claudication or rest pain symptoms  The pt is on a statin for cholesterol management.  The pt is on a daily aspirin.   Other AC: none  The pt is on HCTZ, Clonidine, CCB, BB, ARB for hypertension.   The pt is not diabetic. Tobacco hx:  Former, quit 2014  Past Medical History:  Diagnosis Date  . Abdominal aortic aneurysm (AAA) 3.0 cm to 5.0 cm in diameter in female [facility])    None seen on prior imaging?   Marland Kitchen Anxiety   . Cataract   . Chronic left shoulder pain   . Headache   . Hiatal hernia    small  . Hyperlipidemia   . Hypertension   . Pre-diabetes   . Schatzki's ring   . Tremor     Past Surgical History:  Procedure Laterality Date  . CARDIAC CATHETERIZATION   2005   "Ms. Willis has essentially normal coronary arteries and normal left ventricular function.  She will be treated empirically with anti-reflux measures"  . CATARACT EXTRACTION, BILATERAL    . CHOLECYSTECTOMY    . COLONOSCOPY    06/16/2004   QAS:[id number] rectum/Diminutive polyps of the splenic flexure and the cecum/The remainder of the colonic mucosa appeared normal pathology (hyperplastic polyps).  . COLONOSCOPY N/A 08/07/2015   Dr. Gala Romney: 6 mm descending colon tubular adenoma, multiple medium-mouthed diverticula in sigmoid colon. surveillance 2022  . ESOPHAGOGASTRODUODENOSCOPY  09/11/2001   IWL:[id number] ring otherwise normal esophagus/Normal stomach/Normal D1 and D2 status post passage of a 56 [place] dilator  . ESOPHAGOGASTRODUODENOSCOPY N/A 11/11/2017   Procedure: ESOPHAGOGASTRODUODENOSCOPY (EGD);  Surgeon: Daneil Dolin, MD; mild Schatzki ring s/p dilation and mild erosive gastropathy.  . ESOPHAGOGASTRODUODENOSCOPY (EGD) WITH ESOPHAGEAL DILATION N/A 01/25/2013   Dr. Gala Romney- schatzki's ring- 69 F dilation. small hiatal hernia  . HYSTEROSCOPY WITH D & C N/A 03/06/2019   Procedure: DILATATION AND CURETTAGE /HYSTEROSCOPY;  Surgeon: Jonnie Kind, MD;  Location: [facility];  Service: Gynecology;  Laterality: N/A;  . [name] DILATION N/A 11/11/2017   Procedure: [name] DILATION;  Surgeon: Daneil Dolin, MD;  Location: [facility];  Service: Endoscopy;  Laterality: N/A;  . POLYPECTOMY  N/A 03/06/2019   Procedure: POLYPECTOMY ( REMOVAL OF ENDOMETRIAL POLYP);  Surgeon: Jonnie Kind, MD;  Location: [facility];  Service: Gynecology;  Laterality: N/A;  . TUBAL LIGATION      Social History   Socioeconomic History  . Marital status: Single    Spouse name: Not on file  . Number of children: 3  . Years of education: Not on file  . Highest education level: Not on file  Occupational History  . Occupation: English as a second language teacher: [employer]  Tobacco Use  . Smoking status:  Former Smoker    Packs/day: 0.25    Years: 35.00    Pack years: 8.75    Types: Cigarettes    Quit date: 12/31/2012    Years since quitting: 6.9  . Smokeless tobacco: Never Used  Vaping Use  . Vaping Use: Never used  Substance and Sexual Activity  . Alcohol use: Not Currently    Comment: occasionaly  . Drug use: No  . Sexual activity: Not Currently    Partners: Male    Birth control/protection: Post-menopausal, Surgical    Comment: tubal  Other Topics Concern  . Not on file  Social History Narrative   Eats all food groups.    Wears seatbelt.    Has 3 children.   Prior smoker.   Attends church.    Used to work in Charity fundraiser.    Drives.   Enjoys going out with family.    Social Determinants of Health   Financial Resource Strain: Low Risk   . Difficulty of Paying Living Expenses: Not very hard  Food Insecurity:   . Worried About Charity fundraiser in the Last Year:   . Arboriculturist in the Last Year:   Transportation Needs:   . Film/video editor (Medical):   Marland Kitchen Lack of Transportation (Non-Medical):   Physical Activity:   . Days of Exercise per Week:   . Minutes of Exercise per Session:   Stress:   . Feeling of Stress :   Social Connections:   . Frequency of Communication with Friends and Family:   . Frequency of Social Gatherings with Friends and Family:   . Attends Religious Services:   . Active Member of Clubs or Organizations:   . Attends Archivist Meetings:   Marland Kitchen Marital Status:   Intimate Partner Violence:   . Fear of Current or Ex-Partner:   . Emotionally Abused:   Marland Kitchen Physically Abused:   . Sexually Abused:    Family History  Problem Relation Age of Onset  . Diabetes Other   . Diabetes Mother   . Heart disease Mother   . Heart disease Father   . Diabetes Sister   . Diabetes Brother   . Hypertension Brother   . Diabetes Daughter   . Hypertension Maternal Grandmother   . Stroke Maternal Grandfather   . Early death Paternal Grandfather    . Colon cancer Neg Hx   . Liver disease Neg Hx     Current Outpatient Medications  Medication Sig Dispense Refill  . acetaminophen (TYLENOL) 500 MG tablet Take 500 mg by mouth every 6 (six) hours as needed for moderate pain or headache.    . ALPRAZolam (XANAX) 0.5 MG tablet TAKE 1 TABLET(0.5 MG) BY MOUTH TWICE DAILY AS NEEDED FOR ANXIETY 60 tablet 0  . amLODipine (NORVASC) 10 MG tablet TAKE 1 TABLET(10 MG) BY MOUTH AT BEDTIME (Patient taking differently: Take 10 mg by mouth at  bedtime. ) 90 tablet 3  . amoxicillin (AMOXIL) 875 MG tablet Take 1 tablet (875 mg total) by mouth 2 (two) times daily. 20 tablet 0  . aspirin EC 81 MG tablet Take 81 mg by mouth at bedtime.     . cholecalciferol (VITAMIN D3) 25 MCG (1000 UT) tablet Take 2,000 Units by mouth daily.    . cloNIDine (CATAPRES) 0.1 MG tablet TAKE 1 TABLET BY MOUTH THREE TIMES DAILY AS NEEDED FOR HIGH BLOOD PRESSURE 90 tablet 3  . Cyanocobalamin (B-12) 2500 MCG TABS Take 2,500 mcg by mouth daily.    Marland Kitchen EPINEPHrine 0.3 mg/0.3 mL IJ SOAJ injection Inject 0.3 mg into the muscle as needed for anaphylaxis.     Marland Kitchen escitalopram (LEXAPRO) 10 MG tablet Take 1 tablet (10 mg total) by mouth daily. 30 tablet 5  . fluticasone (FLONASE) 50 MCG/ACT nasal spray Place 2 sprays into both nostrils daily. 16 g 6  . hydrOXYzine (ATARAX/VISTARIL) 25 MG tablet Take 0.5-1 tablets (12.5-25 mg total) by mouth every 8 (eight) hours as needed for itching. 20 tablet 0  . ipratropium (ATROVENT) 0.03 % nasal spray USE 2 SPRAYS IN EACH NOSTRIL EVERY 12 HOURS (Patient taking differently: Place 2 sprays into both nostrils See admin instructions. Instill 2 sprays into each nostril each morning ,may do a second dose later as needed for allergies) 30 mL 12  . LINZESS 145 MCG CAPS capsule TAKE 1 CAPSULE(145 MCG) BY MOUTH DAILY BEFORE BREAKFAST (Patient taking differently: Take 145 mcg by mouth daily before breakfast. ) 30 capsule 3  . loratadine (CLARITIN) 10 MG tablet Take 10 mg by  mouth daily as needed for allergies.    Marland Kitchen MAGNESIUM PO Take 330 mg by mouth daily.    . metoprolol succinate (TOPROL-XL) 100 MG 24 hr tablet Take 1 tablet (100 mg total) by mouth every evening. Take with or immediately following a meal. 90 tablet 3  . mometasone (ELOCON) 0.1 % cream Apply 1 application topically daily as needed (for irritation).     . montelukast (SINGULAIR) 10 MG tablet TAKE 1 TABLET(10 MG) BY MOUTH AT BEDTIME (Patient taking differently: Take 10 mg by mouth at bedtime. ) 30 tablet 3  . Multiple Vitamins-Minerals (ZINC PO) Take 1 tablet by mouth daily.    . ondansetron (ZOFRAN) 4 MG tablet Take 1 tablet (4 mg total) by mouth every 8 (eight) hours as needed for nausea or vomiting. 30 tablet 1  . pantoprazole (PROTONIX) 40 MG tablet TAKE 1 TABLET BY MOUTH TWICE DAILY. TAKE 30 MINUTES BEFORE MEALS (Patient taking differently: Take 40 mg by mouth 2 (two) times daily before a meal. ) 60 tablet 5  . potassium chloride SA (KLOR-CON) 20 MEQ tablet TAKE 1 TABLET(20 MEQ) BY MOUTH DAILY (Patient taking differently: Take 20 mEq by mouth daily. ) 30 tablet 5  . rosuvastatin (CRESTOR) 5 MG tablet Take 1 tablet (5 mg total) by mouth daily. 90 tablet 3  . sodium chloride (OCEAN) 0.65 % SOLN nasal spray Place 1 spray into both nostrils as needed for congestion.    . tacrolimus (PROTOPIC) 0.1 % ointment Apply 1 application topically daily as needed (for irritation).     . triamcinolone cream (KENALOG) 0.1 % APPLY EXTERNALLY TO THE AFFECTED AREA TWICE DAILY (Patient taking differently: Apply 1 application topically 2 (two) times daily as needed (Rash). ) 45 g 0  . valsartan (DIOVAN) 320 MG tablet Take 1 tablet (320 mg total) by mouth daily. 90 tablet 1  .  vitamin C (ASCORBIC ACID) 250 MG tablet Take 500 mg by mouth daily.    . vitamin E 400 UNIT capsule Take 400 Units by mouth daily.    Marland Kitchen zinc gluconate 50 MG tablet Take 50 mg by mouth daily.     No current facility-administered medications for this  visit.    Allergies  Allergen Reactions  . Ciprofloxacin Shortness Of Breath  . Latex Other (See Comments)    Burns skin     REVIEW OF SYSTEMS:  [X]  denotes positive finding, [ ]  denotes negative finding Cardiac  Comments:  Chest pain or chest pressure:    Shortness of breath upon exertion:    Short of breath when lying flat:    Irregular heart rhythm:        Vascular    Pain in calf, thigh, or hip brought on by ambulation:    Pain in feet at night that wakes you up from your sleep:     Blood clot in your veins:    Leg swelling:         Pulmonary    Oxygen at home:    Productive cough:     Wheezing:         Neurologic    Sudden weakness in arms or legs:     Sudden numbness in arms or legs:     Sudden onset of difficulty speaking or slurred speech:    Temporary loss of vision in one eye:     Problems with dizziness:         Gastrointestinal    Blood in stool:     Vomited blood:         Genitourinary    Burning when urinating:     Blood in urine:        Psychiatric    Major depression:         Hematologic    Bleeding problems:    Problems with blood clotting too easily:        Skin    Rashes or ulcers:        Constitutional    Fever or chills:      PHYSICAL EXAMINATION:  Vitals:   12/12/19 1139  BP: (!) 153/78  Pulse: 63  Resp: 20  Temp: (!) 97.1 F (36.2 C)  TempSrc: Temporal  SpO2: 99%  Weight: 173 lb 4.8 oz (78.6 kg)  Height: 5\' 7"  (1.702 m)    General:  WDWN in NAD; vital signs documented above Gait: Normal HENT: WNL, normocephalic Pulmonary: normal non-labored breathing , without Rales, rhonchi,  wheezing Cardiac: regular HR, without  Murmurs without carotid bruit Abdomen: soft, NT, no masses Vascular Exam/Pulses:  Right Left  Radial 2+ (normal) 2+ (normal)  Femoral 2+ (normal) 2+ (normal)  Popliteal Not palpable Not palpable  DP 2+ (normal) 2+ (normal)  PT 1+ (weak) 1+ (weak)   Extremities: without ischemic changes, without  Gangrene , without cellulitis; without open wounds; reticular veins right thigh, left thigh. Varicose vein posterior proximal right calf, right thigh. Non tender to palpation         Musculoskeletal: no muscle wasting or atrophy  Neurologic: A&O X 3;  No focal weakness or paresthesias are detected Psychiatric:  The pt has Normal affect.   Non-Invasive Vascular Imaging:   Venous Reflux Times  +--------------+---------+------+-----------+------------+-----------------  ----+  RIGHT     Reflux NoRefluxReflux TimeDiameter cmsComments  Yes                          +--------------+---------+------+-----------+------------+-----------------  ----+  GSV at SFJ                  0.65               +--------------+---------+------+-----------+------------+-----------------  ----+  GSV prox thigh                0.62               +--------------+---------+------+-----------+------------+-----------------  ----+  GSV mid thigh       yes  >500 ms   0.34               +--------------+---------+------+-----------+------------+-----------------  ----+  GSV dist thigh      yes  >500 ms 0.38 / 0.17 branches x 2       +--------------+---------+------+-----------+------------+-----------------  ----+  GSV at knee                  0.10               +--------------+---------+------+-----------+------------+-----------------  ----+  GSV prox calf                 0.12               +--------------+---------+------+-----------+------------+-----------------  ----+  GSV mid calf       yes  >500 ms   0.23               +--------------+---------+------+-----------+------------+-----------------  ----+    GSV dist calf                 0.23               +--------------+---------+------+-----------+------------+-----------------  ----+  SSV Pop Fossa       yes  >500 ms   1.07  dilatation        +--------------+---------+------+-----------+------------+-----------------  ----+  SSV prox calf       yes  >500 ms 0.12 / 0.49 branching and                                  enlarges         +--------------+---------+------+-----------+------------+-----------------  ----+  SSV mid calf                 0.27               +--------------+---------+------+-----------+------------+-----------------  ----+  SSV origin        yes  >500 ms   0.46  direct  connection                               saph-pop  junction    +--------------+---------+------+-----------+------------+-----------------  ----+   VAS US Carotid Duplex Bilateral Right Carotid: Velocities in the right ICA are consistent with a 40-59% stenosis.   Left Carotid: Velocities in the left ICA are consistent with a 40-59% stenosis.   Vertebrals: Bilateral vertebral arteries demonstrate antegrade flow.  Subclavians: Normal flow hemodynamics were seen in bilateral subclavian arteries.   +-------+-----------+-----------+------------+------------+  ABI/TBIToday's ABIToday's TBIPrevious ABIPrevious TBI  +-------+-----------+-----------+------------+------------+  Right 0.83    0.70                  +-------+-----------+-----------+------------+------------+  Left  0.87    0.68                  +-------+-----------+-----------+------------+------------+    ASSESSMENT/PLAN:: 64 y.o. female here for follow up for right greater than left leg swelling. 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  The GSV diameter is 0.34- 0.65 however she does not have any SFJ reflux. Based on her non invasive study today she would not be candidate for a venous ablation, however I will have her follow up in 3 months with Dr. Oneida Alar or Dr. Scot Dock to re-evaluate. She may be a candidate for possible stab phlebectomy of her symptomatic varicose veins in RLE.  - She will continue her daily elevation, compression stockings and exercise - Her carotid duplex performed on 7/30 Is stable in the right ICA at 40-59% however the velocities on the left have increased slightly and she is now in the 40-59% in the left ICA. She will follow up with repeat carotid duplex in 1 year - Bilateral ABI's/TBI's today show mild arterial disease. She likely has some bilateral runoff disease. She is not symptomatic at this time. Encourage continued walking program. Will repeat ABI's when she has her carotid duplex in 1 year. - She will continue her Aspirin and Statin - She does not need her AAA duplex for 5 years - discussed importance of good blood pressure control - She will follow up in 4-6 weeks with Dr. Scot Dock or Dr. Oneida Alar for further evaluation of her varicose veins in her RLE   Karoline Caldwell, PA-C [facility] [phone]  Clinic MD:  Dr. Oneida Alar

## 2019-12-15 ENCOUNTER — Other Ambulatory Visit: Payer: Self-pay | Admitting: Nurse Practitioner

## 2019-12-15 ENCOUNTER — Other Ambulatory Visit: Payer: Self-pay | Admitting: Family Medicine

## 2019-12-15 DIAGNOSIS — K219 Gastro-esophageal reflux disease without esophagitis: Secondary | ICD-10-CM

## 2019-12-15 DIAGNOSIS — R1013 Epigastric pain: Secondary | ICD-10-CM

## 2019-12-18 ENCOUNTER — Ambulatory Visit
Admission: RE | Admit: 2019-12-18 | Discharge: 2019-12-18 | Disposition: A | Discharge status: Home / Self Care | Payer: Medicare Other | Source: Ambulatory Visit | Attending: Family Medicine | Admitting: Family Medicine

## 2019-12-18 ENCOUNTER — Other Ambulatory Visit: Payer: Self-pay

## 2019-12-18 DIAGNOSIS — I6523 Occlusion and stenosis of bilateral carotid arteries: Secondary | ICD-10-CM | POA: Diagnosis not present

## 2019-12-18 DIAGNOSIS — R270 Ataxia, unspecified: Secondary | ICD-10-CM

## 2019-12-18 DIAGNOSIS — H7093 Unspecified mastoiditis, bilateral: Secondary | ICD-10-CM | POA: Diagnosis not present

## 2019-12-18 DIAGNOSIS — J3489 Other specified disorders of nose and nasal sinuses: Secondary | ICD-10-CM | POA: Diagnosis not present

## 2019-12-18 DIAGNOSIS — R519 Headache, unspecified: Secondary | ICD-10-CM | POA: Diagnosis not present

## 2019-12-19 ENCOUNTER — Other Ambulatory Visit: Payer: Self-pay | Admitting: Family Medicine

## 2019-12-20 ENCOUNTER — Other Ambulatory Visit: Payer: Self-pay

## 2019-12-20 ENCOUNTER — Ambulatory Visit (HOSPITAL_COMMUNITY)
Admission: RE | Admit: 2019-12-20 | Discharge: 2019-12-20 | Disposition: A | Discharge status: Home / Self Care | Payer: Medicare Other | Source: Ambulatory Visit | Attending: Family Medicine | Admitting: Family Medicine

## 2019-12-20 DIAGNOSIS — Z1231 Encounter for screening mammogram for malignant neoplasm of breast: Secondary | ICD-10-CM | POA: Diagnosis not present

## 2019-12-20 MED ORDER — ROSUVASTATIN CALCIUM 5 MG PO TABS: 5.0000 mg | ORAL_TABLET | Freq: Every day | ORAL | 3 refills | Status: DC

## 2019-12-31 ENCOUNTER — Ambulatory Visit (INDEPENDENT_AMBULATORY_CARE_PROVIDER_SITE_OTHER): Payer: Medicare Other | Admitting: Family Medicine

## 2019-12-31 ENCOUNTER — Other Ambulatory Visit: Payer: Self-pay

## 2019-12-31 VITALS — BP 140/60 | HR 57 | Temp 97.5°F | Ht 67.0 in | Wt 170.0 lb

## 2019-12-31 DIAGNOSIS — I10 Essential (primary) hypertension: Secondary | ICD-10-CM

## 2019-12-31 MED ORDER — HYDROCHLOROTHIAZIDE 25 MG PO TABS
25.0000 mg | ORAL_TABLET | Freq: Every day | ORAL | 11 refills | Status: DC
Start: 2019-12-31 — End: 2020-08-01

## 2019-12-31 NOTE — Progress Notes (Signed)
Subjective:    Patient ID: [name], female    DOB: 1956/02/22, 64 y.o.   MRN: [id number]  HPI   Patient states that her blood pressure has been elevated recently.  Her blood pressure in the morning has been 180/100.  Her blood pressure later in the day has been 300 systolic.  Even in the evening she is seeing 160-180.  She takes amlodipine at night.  She takes metoprolol around lunchtime.  She is taking valsartan in the morning.  She is not missing any of her medications.  She states that she is having a pulsatile headache in the morning when she wakes up due to her elevated blood pressure.  She denies any chest pain, shortness of breath, dyspnea on exertion Past Medical History:  Diagnosis Date  . Abdominal aortic aneurysm (AAA) 3.0 cm to 5.0 cm in diameter in female [facility])    None seen on prior imaging?   Marland Kitchen Anxiety   . Cataract   . Chronic left shoulder pain   . Headache   . Hiatal hernia    small  . Hyperlipidemia   . Hypertension   . Pre-diabetes   . Schatzki's ring   . Tremor    Past Surgical History:  Procedure Laterality Date  . CARDIAC CATHETERIZATION  2005   "Ms. Broski has essentially normal coronary arteries and normal left ventricular function.  She will be treated empirically with anti-reflux measures"  . CATARACT EXTRACTION, BILATERAL    . CHOLECYSTECTOMY    . COLONOSCOPY    06/16/2004   TMA:UQJFHL rectum/Diminutive polyps of the splenic flexure and the cecum/The remainder of the colonic mucosa appeared normal pathology (hyperplastic polyps).  . COLONOSCOPY N/A 08/07/2015   Dr. Gala Romney: 6 mm descending colon tubular adenoma, multiple medium-mouthed diverticula in sigmoid colon. surveillance 2022  . ESOPHAGOGASTRODUODENOSCOPY  09/11/2001   KTG:[id number] ring otherwise normal esophagus/Normal stomach/Normal D1 and D2 status post passage of a 56 [place] dilator  . ESOPHAGOGASTRODUODENOSCOPY N/A 11/11/2017   Procedure: ESOPHAGOGASTRODUODENOSCOPY (EGD);   Surgeon: Daneil Dolin, MD; mild Schatzki ring s/p dilation and mild erosive gastropathy.  . ESOPHAGOGASTRODUODENOSCOPY (EGD) WITH ESOPHAGEAL DILATION N/A 01/25/2013   Dr. Gala Romney- schatzki's ring- 27 F dilation. small hiatal hernia  . HYSTEROSCOPY WITH D & C N/A 03/06/2019   Procedure: DILATATION AND CURETTAGE /HYSTEROSCOPY;  Surgeon: Jonnie Kind, MD;  Location: [facility];  Service: Gynecology;  Laterality: N/A;  . [name] DILATION N/A 11/11/2017   Procedure: [name] DILATION;  Surgeon: Daneil Dolin, MD;  Location: [facility] END[facility] SUITE;  Service: Endoscopy;  Laterality: N/A;  . POLYPECTOMY N/A 03/06/2019   Procedure: POLYPECTOMY ( REMOVAL OF ENDOMETRIAL POLYP);  Surgeon: Jonnie Kind, MD;  Location: [facility] ORS;  Service: Gynecology;  Laterality: N/A;  . TUBAL LIGATION     Current Outpatient Medications on File Prior to Visit  Medication Sig Dispense Refill  . acetaminophen (TYLENOL) 500 MG tablet Take 500 mg by mouth every 6 (six) hours as needed for moderate pain or headache.    . ALPRAZolam (XANAX) 0.5 MG tablet TAKE 1 TABLET(0.5 MG) BY MOUTH TWICE DAILY AS NEEDED FOR ANXIETY 60 tablet 0  . amLODipine (NORVASC) 10 MG tablet TAKE 1 TABLET(10 MG) BY MOUTH AT BEDTIME 90 tablet 3  . aspirin EC 81 MG tablet Take 81 mg by mouth at bedtime.     Marland Kitchen atorvastatin (LIPITOR) 80 MG tablet TAKE 1 TABLET BY MOUTH DAILY 90 tablet 3  . cholecalciferol (VITAMIN D3) 25 MCG (  1000 UT) tablet Take 2,000 Units by mouth daily.    . cloNIDine (CATAPRES) 0.1 MG tablet TAKE 1 TABLET BY MOUTH THREE TIMES DAILY AS NEEDED FOR HIGH BLOOD PRESSURE 90 tablet 3  . Cyanocobalamin (B-12) 2500 MCG TABS Take 2,500 mcg by mouth daily.    Marland Kitchen EPINEPHrine 0.3 mg/0.3 mL IJ SOAJ injection Inject 0.3 mg into the muscle as needed for anaphylaxis.     Marland Kitchen escitalopram (LEXAPRO) 10 MG tablet Take 1 tablet (10 mg total) by mouth daily. 30 tablet 5  . fluticasone (FLONASE) 50 MCG/ACT nasal spray Place 2 sprays into both nostrils daily. 16 g 6  .  hydrOXYzine (ATARAX/VISTARIL) 25 MG tablet Take 0.5-1 tablets (12.5-25 mg total) by mouth every 8 (eight) hours as needed for itching. 20 tablet 0  . ipratropium (ATROVENT) 0.03 % nasal spray USE 2 SPRAYS IN EACH NOSTRIL EVERY 12 HOURS (Patient taking differently: Place 2 sprays into both nostrils See admin instructions. Instill 2 sprays into each nostril each morning ,may do a second dose later as needed for allergies) 30 mL 12  . LINZESS 145 MCG CAPS capsule TAKE 1 CAPSULE(145 MCG) BY MOUTH DAILY BEFORE BREAKFAST (Patient taking differently: Take 145 mcg by mouth daily before breakfast. ) 30 capsule 3  . loratadine (CLARITIN) 10 MG tablet Take 10 mg by mouth daily as needed for allergies.    Marland Kitchen MAGNESIUM PO Take 330 mg by mouth daily.    . metoprolol succinate (TOPROL-XL) 100 MG 24 hr tablet Take 1 tablet (100 mg total) by mouth every evening. Take with or immediately following a meal. 90 tablet 3  . mometasone (ELOCON) 0.1 % cream Apply 1 application topically daily as needed (for irritation).     . montelukast (SINGULAIR) 10 MG tablet TAKE 1 TABLET(10 MG) BY MOUTH AT BEDTIME (Patient taking differently: Take 10 mg by mouth at bedtime. ) 30 tablet 3  . Multiple Vitamins-Minerals (ZINC PO) Take 1 tablet by mouth daily.    . ondansetron (ZOFRAN) 4 MG tablet Take 1 tablet (4 mg total) by mouth every 8 (eight) hours as needed for nausea or vomiting. 30 tablet 1  . pantoprazole (PROTONIX) 40 MG tablet TAKE 1 TABLET BY MOUTH TWICE DAILY 30 MINUTES BEFORE MEALS 60 tablet 5  . potassium chloride SA (KLOR-CON) 20 MEQ tablet TAKE 1 TABLET(20 MEQ) BY MOUTH DAILY 30 tablet 5  . rosuvastatin (CRESTOR) 5 MG tablet Take 1 tablet (5 mg total) by mouth daily. 90 tablet 3  . sodium chloride (OCEAN) 0.65 % SOLN nasal spray Place 1 spray into both nostrils as needed for congestion.    . tacrolimus (PROTOPIC) 0.1 % ointment Apply 1 application topically daily as needed (for irritation).     . triamcinolone cream  (KENALOG) 0.1 % APPLY EXTERNALLY TO THE AFFECTED AREA TWICE DAILY (Patient taking differently: Apply 1 application topically 2 (two) times daily as needed (Rash). ) 45 g 0  . valsartan (DIOVAN) 320 MG tablet Take 1 tablet (320 mg total) by mouth daily. 90 tablet 1  . vitamin C (ASCORBIC ACID) 250 MG tablet Take 500 mg by mouth daily.    . vitamin E 400 UNIT capsule Take 400 Units by mouth daily.    Marland Kitchen zinc gluconate 50 MG tablet Take 50 mg by mouth daily.     No current facility-administered medications on file prior to visit.   Allergies  Allergen Reactions  . Ciprofloxacin Shortness Of Breath  . Latex Other (See Comments)  Burns skin   Social History   Socioeconomic History  . Marital status: Single    Spouse name: Not on file  . Number of children: 3  . Years of education: Not on file  . Highest education level: Not on file  Occupational History  . Occupation: English as a second language teacher: [employer]  Tobacco Use  . Smoking status: Former Smoker    Packs/day: 0.25    Years: 35.00    Pack years: 8.75    Types: Cigarettes    Quit date: 12/31/2012    Years since quitting: 7.0  . Smokeless tobacco: Never Used  Vaping Use  . Vaping Use: Never used  Substance and Sexual Activity  . Alcohol use: Not Currently    Comment: occasionaly  . Drug use: No  . Sexual activity: Not Currently    Partners: Male    Birth control/protection: Post-menopausal, Surgical    Comment: tubal  Other Topics Concern  . Not on file  Social History Narrative   Eats all food groups.    Wears seatbelt.    Has 3 children.   Prior smoker.   Attends church.    Used to work in [employer].    Drives.   Enjoys going out with family.    Social Determinants of Health   Financial Resource Strain: Low Risk   . Difficulty of Paying Living Expenses: Not very hard  Food Insecurity:   . Worried About Charity fundraiser in the Last Year: Not on file  . Ran Out of Food in the Last Year: Not on file    Transportation Needs:   . Lack of Transportation (Medical): Not on file  . Lack of Transportation (Non-Medical): Not on file  Physical Activity:   . Days of Exercise per Week: Not on file  . Minutes of Exercise per Session: Not on file  Stress:   . Feeling of Stress : Not on file  Social Connections:   . Frequency of Communication with Friends and Family: Not on file  . Frequency of Social Gatherings with Friends and Family: Not on file  . Attends Religious Services: Not on file  . Active Member of Clubs or Organizations: Not on file  . Attends Archivist Meetings: Not on file  . Marital Status: Not on file  Intimate Partner Violence:   . Fear of Current or Ex-Partner: Not on file  . Emotionally Abused: Not on file  . Physically Abused: Not on file  . Sexually Abused: Not on file   Family History  Problem Relation Age of Onset  . Diabetes Other   . Diabetes Mother   . Heart disease Mother   . Heart disease Father   . Diabetes Sister   . Diabetes Brother   . Hypertension Brother   . Diabetes Daughter   . Hypertension Maternal Grandmother   . Stroke Maternal Grandfather   . Early death Paternal Grandfather   . Colon cancer Neg Hx   . Liver disease Neg Hx       Review of Systems  All other systems reviewed and are negative.      Objective:   Physical Exam Vitals reviewed.  Constitutional:      Appearance: She is well-developed.  Cardiovascular:     Rate and Rhythm: Normal rate and regular rhythm.     Heart sounds: Normal heart sounds. No murmur heard.  No friction rub. No gallop.   Pulmonary:     Effort: Pulmonary  effort is normal. No respiratory distress.     Breath sounds: Normal breath sounds. No stridor. No wheezing or rales.  Chest:     Chest wall: No tenderness.  Musculoskeletal:     Right hand: Normal. No swelling, deformity, tenderness or bony tenderness. Normal range of motion.     Left hand: Normal. No swelling, deformity, tenderness or  bony tenderness. Normal range of motion.  Neurological:     Motor: No abnormal muscle tone.           Assessment & Plan:  Essential hypertension  Blood pressure today is borderline however her blood pressures at home are extremely high.  We will resume hydrochlorothiazide 25 mg a day and recheck blood pressure in 2 weeks

## 2020-01-04 ENCOUNTER — Telehealth: Payer: Self-pay

## 2020-01-04 NOTE — Chronic Care Management (AMB) (Deleted)
Chronic Care Management   Follow Up Note   01/04/2020 Name: [name] MRN: [id number] DOB: [birth year]  Referred by: Susy Frizzle, MD Reason for referral : No chief complaint on file.   [name] is a 64 y.o. year old female who is a primary care patient of Pickard, Cammie Mcgee, MD. The CCM team was consulted for assistance with chronic disease management and care coordination needs.    Review of patient status, including review of consultants reports, relevant laboratory and other test results, and collaboration with appropriate care team members and the patient's provider was performed as part of comprehensive patient evaluation and provision of chronic care management services.    SDOH (Social Determinants of Health) assessments performed: No See Care Plan activities for detailed interventions related to [facility])     Outpatient Encounter Medications as of 01/04/2020  Medication Sig  . acetaminophen (TYLENOL) 500 MG tablet Take 500 mg by mouth every 6 (six) hours as needed for moderate pain or headache.  . ALPRAZolam (XANAX) 0.5 MG tablet TAKE 1 TABLET(0.5 MG) BY MOUTH TWICE DAILY AS NEEDED FOR ANXIETY  . amLODipine (NORVASC) 10 MG tablet TAKE 1 TABLET(10 MG) BY MOUTH AT BEDTIME  . aspirin EC 81 MG tablet Take 81 mg by mouth at bedtime.   Marland Kitchen atorvastatin (LIPITOR) 80 MG tablet TAKE 1 TABLET BY MOUTH DAILY  . cholecalciferol (VITAMIN D3) 25 MCG (1000 UT) tablet Take 2,000 Units by mouth daily.  . cloNIDine (CATAPRES) 0.1 MG tablet TAKE 1 TABLET BY MOUTH THREE TIMES DAILY AS NEEDED FOR HIGH BLOOD PRESSURE  . Cyanocobalamin (B-12) 2500 MCG TABS Take 2,500 mcg by mouth daily.  Marland Kitchen EPINEPHrine 0.3 mg/0.3 mL IJ SOAJ injection Inject 0.3 mg into the muscle as needed for anaphylaxis.   Marland Kitchen escitalopram (LEXAPRO) 10 MG tablet Take 1 tablet (10 mg total) by mouth daily.  . fluticasone (FLONASE) 50 MCG/ACT nasal spray Place 2 sprays into both nostrils daily.  . hydrochlorothiazide  (HYDRODIURIL) 25 MG tablet Take 1 tablet (25 mg total) by mouth daily.  . hydrOXYzine (ATARAX/VISTARIL) 25 MG tablet Take 0.5-1 tablets (12.5-25 mg total) by mouth every 8 (eight) hours as needed for itching.  Marland Kitchen ipratropium (ATROVENT) 0.03 % nasal spray USE 2 SPRAYS IN EACH NOSTRIL EVERY 12 HOURS (Patient taking differently: Place 2 sprays into both nostrils See admin instructions. Instill 2 sprays into each nostril each morning ,may do a second dose later as needed for allergies)  . LINZESS 145 MCG CAPS capsule TAKE 1 CAPSULE(145 MCG) BY MOUTH DAILY BEFORE BREAKFAST (Patient taking differently: Take 145 mcg by mouth daily before breakfast. )  . loratadine (CLARITIN) 10 MG tablet Take 10 mg by mouth daily as needed for allergies.  Marland Kitchen MAGNESIUM PO Take 330 mg by mouth daily.  . metoprolol succinate (TOPROL-XL) 100 MG 24 hr tablet Take 1 tablet (100 mg total) by mouth every evening. Take with or immediately following a meal.  . mometasone (ELOCON) 0.1 % cream Apply 1 application topically daily as needed (for irritation).   . montelukast (SINGULAIR) 10 MG tablet TAKE 1 TABLET(10 MG) BY MOUTH AT BEDTIME (Patient taking differently: Take 10 mg by mouth at bedtime. )  . Multiple Vitamins-Minerals (ZINC PO) Take 1 tablet by mouth daily.  . ondansetron (ZOFRAN) 4 MG tablet Take 1 tablet (4 mg total) by mouth every 8 (eight) hours as needed for nausea or vomiting.  . pantoprazole (PROTONIX) 40 MG tablet TAKE 1 TABLET BY MOUTH TWICE DAILY  30 MINUTES BEFORE MEALS  . potassium chloride SA (KLOR-CON) 20 MEQ tablet TAKE 1 TABLET(20 MEQ) BY MOUTH DAILY  . rosuvastatin (CRESTOR) 5 MG tablet Take 1 tablet (5 mg total) by mouth daily.  . sodium chloride (OCEAN) 0.65 % SOLN nasal spray Place 1 spray into both nostrils as needed for congestion.  . tacrolimus (PROTOPIC) 0.1 % ointment Apply 1 application topically daily as needed (for irritation).   . triamcinolone cream (KENALOG) 0.1 % APPLY EXTERNALLY TO THE AFFECTED  AREA TWICE DAILY (Patient taking differently: Apply 1 application topically 2 (two) times daily as needed (Rash). )  . valsartan (DIOVAN) 320 MG tablet Take 1 tablet (320 mg total) by mouth daily.  . vitamin C (ASCORBIC ACID) 250 MG tablet Take 500 mg by mouth daily.  . vitamin E 400 UNIT capsule Take 400 Units by mouth daily.  Marland Kitchen zinc gluconate 50 MG tablet Take 50 mg by mouth daily.   No facility-administered encounter medications on file as of 01/04/2020.     Goals Addressed   None     Diabetes   Recent Relevant Labs: Lab Results  Component Value Date/Time   HGBA1C 5.8 (H) 09/13/2019 02:39 PM   HGBA1C 6.6 (H) 03/02/2019 01:26 PM   MICROALBUR 0.2 09/13/2019 02:39 PM   MICROALBUR 0.3 06/27/2018 08:48 AM     Checking BG:  occasionally.   Patient has failed these meds in past: metformin 500mg  Patient is currently controlled on the following medications: none noted  Last diabetic Foot exam: No results found for: HMDIABEYEEXA  Last diabetic Eye exam: No results found for: HMDIABFOOTEX   Patient has been walking for ~ 2 months now and prefers to control this way.  Most recent A1c well controlled, patient encouraged to hear this.  Plan  Continue control with diet and exercise. Hypertension    Office blood pressures are  BP Readings from Last 3 Encounters:  12/31/19 140/60  12/12/19 (!) 153/78  12/04/19 (!) 144/78    Patient has failed these meds in the past: losartan 100mg   Patient checks BP at home  three times per day  Patient home BP readings are ranging:   Patient is currently uncontrolled on the following medications: valsartan 320mg  daily, metoprolol 100mg  daily, HCTZ 25mg  daily, clonidine 0.1mg , amlodipine 10mg   Dizziness, fatigue??  Pulse??    Plan     Hyperlipidemia   Lipid Panel     Component Value Date/Time   CHOL 157 06/27/2018 0848   TRIG 49 06/27/2018 0848   HDL 66 06/27/2018 0848   CHOLHDL 2.4 06/27/2018 0848   LDLCALC 77 06/27/2018  0848     The 10-year ASCVD risk score Mikey Bussing DC Jr., et al., 2013) is: 19.6%   Values used to calculate the score:     Age: 1 years     Sex: Female     Is Non-Hispanic African American: Yes     Diabetic: Yes     Tobacco smoker: No     Systolic Blood Pressure: 573 mmHg     Is BP treated: Yes     HDL Cholesterol: 66 mg/dL     Total Cholesterol: 157 mg/dL   Patient has failed these meds in past: atorvastatin, pravastatin Patient is currently uncontrolled on the following medications: rosuvastatin 5mg  nightly  No myalgias currently with new statin.  Patient due for lipid recheck.  Plan  Continue current medications.  Recheck lipids to determine level of control.     [name], PharmD Clinical Pharmacist  [place] [phone]  I have collaborated with the care management provider regarding care management and care coordination activities outlined in this encounter and have reviewed this encounter including documentation in the note and care plan. I am certifying that I agree with the content of this note and encounter as supervising physician.

## 2020-01-09 DIAGNOSIS — R519 Headache, unspecified: Secondary | ICD-10-CM | POA: Diagnosis not present

## 2020-01-09 DIAGNOSIS — J301 Allergic rhinitis due to pollen: Secondary | ICD-10-CM | POA: Diagnosis not present

## 2020-01-09 DIAGNOSIS — J0110 Acute frontal sinusitis, unspecified: Secondary | ICD-10-CM | POA: Diagnosis not present

## 2020-01-11 DIAGNOSIS — R519 Headache, unspecified: Secondary | ICD-10-CM | POA: Diagnosis not present

## 2020-01-24 ENCOUNTER — Ambulatory Visit: Payer: Medicare Other | Admitting: Vascular Surgery

## 2020-01-28 ENCOUNTER — Ambulatory Visit (INDEPENDENT_AMBULATORY_CARE_PROVIDER_SITE_OTHER): Payer: Medicare Other | Admitting: Family Medicine

## 2020-01-28 ENCOUNTER — Other Ambulatory Visit: Payer: Self-pay

## 2020-01-28 VITALS — BP 144/70 | HR 65 | Temp 97.9°F | Ht 67.0 in | Wt 171.0 lb

## 2020-01-28 DIAGNOSIS — M25562 Pain in left knee: Secondary | ICD-10-CM

## 2020-01-28 NOTE — Progress Notes (Signed)
Subjective:    Patient ID: [name], female    DOB: [birth year], 64 y.o.   MRN: [id number]  HPI  Patient has a history of left knee pain. However most recent x-ray revealed no osteoarthritis. I suspect that the patient has a meniscal tear. Last cortisone shot lasted more than a year. The patient states that over the last 3 weeks the pain has returned. She reports moderate to severe pain in the medial and lateral joint line. There is no erythema. There is no effusion. She denies any locking or laxity. She has negative anterior drawer sign. She has some crepitus with range of motion but primarily pain in the posterior aspect of the joint. The pain radiates down the leg slightly. Past Medical History:  Diagnosis Date  . Abdominal aortic aneurysm (AAA) 3.0 cm to 5.0 cm in diameter in female [facility])    None seen on prior imaging?   Marland Kitchen Anxiety   . Cataract   . Chronic left shoulder pain   . Headache   . Hiatal hernia    small  . Hyperlipidemia   . Hypertension   . Pre-diabetes   . Schatzki's ring   . Tremor    Past Surgical History:  Procedure Laterality Date  . CARDIAC CATHETERIZATION  2005   "[name] has essentially normal coronary arteries and normal left ventricular function.  She will be treated empirically with anti-reflux measures"  . CATARACT EXTRACTION, BILATERAL    . CHOLECYSTECTOMY    . COLONOSCOPY    06/16/2004   VEH:MCNOBS rectum/Diminutive polyps of the splenic flexure and the cecum/The remainder of the colonic mucosa appeared normal pathology (hyperplastic polyps).  . COLONOSCOPY N/A 08/07/2015   Dr. Gala Romney: 6 mm descending colon tubular adenoma, multiple medium-mouthed diverticula in sigmoid colon. surveillance 2022  . ESOPHAGOGASTRODUODENOSCOPY  09/11/2001   JGG:EZMOQHUTM ring otherwise normal esophagus/Normal stomach/Normal D1 and D2 status post passage of a 56 [place] dilator  . ESOPHAGOGASTRODUODENOSCOPY N/A 11/11/2017   Procedure:  ESOPHAGOGASTRODUODENOSCOPY (EGD);  Surgeon: Daneil Dolin, MD; mild Schatzki ring s/p dilation and mild erosive gastropathy.  . ESOPHAGOGASTRODUODENOSCOPY (EGD) WITH ESOPHAGEAL DILATION N/A 01/25/2013   Dr. Gala Romney- schatzki's ring- 52 F dilation. small hiatal hernia  . HYSTEROSCOPY WITH D & C N/A 03/06/2019   Procedure: DILATATION AND CURETTAGE /HYSTEROSCOPY;  Surgeon: Jonnie Kind, MD;  Location: [facility];  Service: Gynecology;  Laterality: N/A;  . [name] DILATION N/A 11/11/2017   Procedure: [name] DILATION;  Surgeon: Daneil Dolin, MD;  Location: [facility] SUITE;  Service: Endoscopy;  Laterality: N/A;  . POLYPECTOMY N/A 03/06/2019   Procedure: POLYPECTOMY ( REMOVAL OF ENDOMETRIAL POLYP);  Surgeon: Jonnie Kind, MD;  Location: [facility];  Service: Gynecology;  Laterality: N/A;  . TUBAL LIGATION     Current Outpatient Medications on File Prior to Visit  Medication Sig Dispense Refill  . acetaminophen (TYLENOL) 500 MG tablet Take 500 mg by mouth every 6 (six) hours as needed for moderate pain or headache.    . ALPRAZolam (XANAX) 0.5 MG tablet TAKE 1 TABLET(0.5 MG) BY MOUTH TWICE DAILY AS NEEDED FOR ANXIETY 60 tablet 0  . amLODipine (NORVASC) 10 MG tablet TAKE 1 TABLET(10 MG) BY MOUTH AT BEDTIME 90 tablet 3  . aspirin EC 81 MG tablet Take 81 mg by mouth at bedtime.     Marland Kitchen atorvastatin (LIPITOR) 80 MG tablet TAKE 1 TABLET BY MOUTH DAILY 90 tablet 3  . cholecalciferol (VITAMIN D3) 25 MCG (1000 UT) tablet  Take 2,000 Units by mouth daily.    . cloNIDine (CATAPRES) 0.1 MG tablet TAKE 1 TABLET BY MOUTH THREE TIMES DAILY AS NEEDED FOR HIGH BLOOD PRESSURE 90 tablet 3  . Cyanocobalamin (B-12) 2500 MCG TABS Take 2,500 mcg by mouth daily.    Marland Kitchen EPINEPHrine 0.3 mg/0.3 mL IJ SOAJ injection Inject 0.3 mg into the muscle as needed for anaphylaxis.     Marland Kitchen escitalopram (LEXAPRO) 10 MG tablet Take 1 tablet (10 mg total) by mouth daily. 30 tablet 5  . fluticasone (FLONASE) 50 MCG/ACT nasal spray Place 2 sprays  into both nostrils daily. 16 g 6  . hydrochlorothiazide (HYDRODIURIL) 25 MG tablet Take 1 tablet (25 mg total) by mouth daily. 30 tablet 11  . hydrOXYzine (ATARAX/VISTARIL) 25 MG tablet Take 0.5-1 tablets (12.5-25 mg total) by mouth every 8 (eight) hours as needed for itching. 20 tablet 0  . ipratropium (ATROVENT) 0.03 % nasal spray USE 2 SPRAYS IN EACH NOSTRIL EVERY 12 HOURS (Patient taking differently: Place 2 sprays into both nostrils See admin instructions. Instill 2 sprays into each nostril each morning ,may do a second dose later as needed for allergies) 30 mL 12  . LINZESS 145 MCG CAPS capsule TAKE 1 CAPSULE(145 MCG) BY MOUTH DAILY BEFORE BREAKFAST (Patient taking differently: Take 145 mcg by mouth daily before breakfast. ) 30 capsule 3  . loratadine (CLARITIN) 10 MG tablet Take 10 mg by mouth daily as needed for allergies.    Marland Kitchen MAGNESIUM PO Take 330 mg by mouth daily.    . metoprolol succinate (TOPROL-XL) 100 MG 24 hr tablet Take 1 tablet (100 mg total) by mouth every evening. Take with or immediately following a meal. 90 tablet 3  . mometasone (ELOCON) 0.1 % cream Apply 1 application topically daily as needed (for irritation).     . montelukast (SINGULAIR) 10 MG tablet TAKE 1 TABLET(10 MG) BY MOUTH AT BEDTIME (Patient taking differently: Take 10 mg by mouth at bedtime. ) 30 tablet 3  . Multiple Vitamins-Minerals (ZINC PO) Take 1 tablet by mouth daily.    . ondansetron (ZOFRAN) 4 MG tablet Take 1 tablet (4 mg total) by mouth every 8 (eight) hours as needed for nausea or vomiting. 30 tablet 1  . pantoprazole (PROTONIX) 40 MG tablet TAKE 1 TABLET BY MOUTH TWICE DAILY 30 MINUTES BEFORE MEALS 60 tablet 5  . potassium chloride SA (KLOR-CON) 20 MEQ tablet TAKE 1 TABLET(20 MEQ) BY MOUTH DAILY 30 tablet 5  . rosuvastatin (CRESTOR) 5 MG tablet Take 1 tablet (5 mg total) by mouth daily. 90 tablet 3  . sodium chloride (OCEAN) 0.65 % SOLN nasal spray Place 1 spray into both nostrils as needed for  congestion.    . tacrolimus (PROTOPIC) 0.1 % ointment Apply 1 application topically daily as needed (for irritation).     . triamcinolone cream (KENALOG) 0.1 % APPLY EXTERNALLY TO THE AFFECTED AREA TWICE DAILY (Patient taking differently: Apply 1 application topically 2 (two) times daily as needed (Rash). ) 45 g 0  . valsartan (DIOVAN) 320 MG tablet Take 1 tablet (320 mg total) by mouth daily. 90 tablet 1  . vitamin C (ASCORBIC ACID) 250 MG tablet Take 500 mg by mouth daily.    . vitamin E 400 UNIT capsule Take 400 Units by mouth daily.    Marland Kitchen zinc gluconate 50 MG tablet Take 50 mg by mouth daily.     No current facility-administered medications on file prior to visit.   Allergies  Allergen  Reactions  . Ciprofloxacin Shortness Of Breath  . Latex Other (See Comments)    Burns skin   Social History   Socioeconomic History  . Marital status: Single    Spouse name: Not on file  . Number of children: 3  . Years of education: Not on file  . Highest education level: Not on file  Occupational History  . Occupation: English as a second language teacher: [employer]  Tobacco Use  . Smoking status: Former Smoker    Packs/day: 0.25    Years: 35.00    Pack years: 8.75    Types: Cigarettes    Quit date: 12/31/2012    Years since quitting: 7.0  . Smokeless tobacco: Never Used  Vaping Use  . Vaping Use: Never used  Substance and Sexual Activity  . Alcohol use: Not Currently    Comment: occasionaly  . Drug use: No  . Sexual activity: Not Currently    Partners: Male    Birth control/protection: Post-menopausal, Surgical    Comment: tubal  Other Topics Concern  . Not on file  Social History Narrative   Eats all food groups.    Wears seatbelt.    Has 3 children.   Prior smoker.   Attends church.    Used to work in [employer] fund[employer].    Drives.   Enjoys going out with family.    Social Determinants of Health   Financial Resource Strain: Low Risk   . Difficulty of Paying Living Expenses: Not  very hard  Food Insecurity:   . Worried About Charity fundraiser in the Last Year: Not on file  . Ran Out of Food in the Last Year: Not on file  Transportation Needs:   . Lack of Transportation (Medical): Not on file  . Lack of Transportation (Non-Medical): Not on file  Physical Activity:   . Days of Exercise per Week: Not on file  . Minutes of Exercise per Session: Not on file  Stress:   . Feeling of Stress : Not on file  Social Connections:   . Frequency of Communication with Friends and Family: Not on file  . Frequency of Social Gatherings with Friends and Family: Not on file  . Attends Religious Services: Not on file  . Active Member of Clubs or Organizations: Not on file  . Attends Archivist Meetings: Not on file  . Marital Status: Not on file  Intimate Partner Violence:   . Fear of Current or Ex-Partner: Not on file  . Emotionally Abused: Not on file  . Physically Abused: Not on file  . Sexually Abused: Not on file   Family History  Problem Relation Age of Onset  . Diabetes Other   . Diabetes Mother   . Heart disease Mother   . Heart disease Father   . Diabetes Sister   . Diabetes Brother   . Hypertension Brother   . Diabetes Daughter   . Hypertension Maternal Grandmother   . Stroke Maternal Grandfather   . Early death Paternal Grandfather   . Colon cancer Neg Hx   . Liver disease Neg Hx       Review of Systems  All other systems reviewed and are negative.      Objective:   Physical Exam Vitals reviewed.  Constitutional:      Appearance: She is well-developed.  Cardiovascular:     Rate and Rhythm: Normal rate and regular rhythm.     Heart sounds: Normal heart sounds. No murmur heard.  No friction rub. No gallop.   Pulmonary:     Effort: Pulmonary effort is normal. No respiratory distress.     Breath sounds: Normal breath sounds. No stridor. No wheezing or rales.  Chest:     Chest wall: No tenderness.  Musculoskeletal:     Left knee: No  bony tenderness. Decreased range of motion. Tenderness present over the lateral joint line. No medial joint line, MCL, LCL or patellar tendon tenderness. No LCL laxity or MCL laxity.Normal meniscus.  Neurological:     Motor: No abnormal muscle tone.           Assessment & Plan:  Posterior left knee pain   I believe the patient likely has a meniscal tear in the left knee.  Using sterile technique, I injected the left knee with 2 cc lidocaine, 2 cc of Marcaine, and 2 cc of 40 mg/mL Kenalog.  Patient tolerated the procedure well without complication.  Recheck if no better in 2 weeks. If pain does not improve I would recommend an MRI of the knee to evaluate further

## 2020-01-30 ENCOUNTER — Ambulatory Visit: Payer: Medicare Other | Admitting: Vascular Surgery

## 2020-01-31 ENCOUNTER — Ambulatory Visit: Payer: Medicare Other | Admitting: Vascular Surgery

## 2020-02-14 ENCOUNTER — Other Ambulatory Visit: Payer: Self-pay

## 2020-02-14 ENCOUNTER — Ambulatory Visit (INDEPENDENT_AMBULATORY_CARE_PROVIDER_SITE_OTHER): Payer: Medicare Other | Admitting: *Deleted

## 2020-02-14 DIAGNOSIS — Z23 Encounter for immunization: Secondary | ICD-10-CM | POA: Diagnosis not present

## 2020-02-16 ENCOUNTER — Other Ambulatory Visit: Payer: Self-pay

## 2020-02-16 DIAGNOSIS — I10 Essential (primary) hypertension: Secondary | ICD-10-CM | POA: Insufficient documentation

## 2020-02-16 DIAGNOSIS — Z79899 Other long term (current) drug therapy: Secondary | ICD-10-CM | POA: Insufficient documentation

## 2020-02-16 DIAGNOSIS — Z87891 Personal history of nicotine dependence: Secondary | ICD-10-CM | POA: Insufficient documentation

## 2020-02-16 DIAGNOSIS — I700 Atherosclerosis of aorta: Secondary | ICD-10-CM | POA: Diagnosis not present

## 2020-02-16 DIAGNOSIS — Z91040 Latex allergy status: Secondary | ICD-10-CM | POA: Insufficient documentation

## 2020-02-16 DIAGNOSIS — J9811 Atelectasis: Secondary | ICD-10-CM | POA: Diagnosis not present

## 2020-02-16 DIAGNOSIS — N281 Cyst of kidney, acquired: Secondary | ICD-10-CM | POA: Diagnosis not present

## 2020-02-16 DIAGNOSIS — Z7982 Long term (current) use of aspirin: Secondary | ICD-10-CM | POA: Insufficient documentation

## 2020-02-16 DIAGNOSIS — E785 Hyperlipidemia, unspecified: Secondary | ICD-10-CM | POA: Diagnosis not present

## 2020-02-16 DIAGNOSIS — E1169 Type 2 diabetes mellitus with other specified complication: Secondary | ICD-10-CM | POA: Diagnosis not present

## 2020-02-16 DIAGNOSIS — M545 Low back pain, unspecified: Secondary | ICD-10-CM | POA: Diagnosis not present

## 2020-02-16 DIAGNOSIS — Z86010 Personal history of colonic polyps: Secondary | ICD-10-CM | POA: Insufficient documentation

## 2020-02-16 DIAGNOSIS — M5459 Other low back pain: Secondary | ICD-10-CM | POA: Diagnosis not present

## 2020-02-16 DIAGNOSIS — I714 Abdominal aortic aneurysm, without rupture: Secondary | ICD-10-CM | POA: Insufficient documentation

## 2020-02-16 DIAGNOSIS — K5730 Diverticulosis of large intestine without perforation or abscess without bleeding: Secondary | ICD-10-CM | POA: Diagnosis not present

## 2020-02-16 DIAGNOSIS — M47817 Spondylosis without myelopathy or radiculopathy, lumbosacral region: Secondary | ICD-10-CM | POA: Diagnosis not present

## 2020-02-17 ENCOUNTER — Encounter (HOSPITAL_COMMUNITY): Payer: Self-pay | Admitting: Emergency Medicine

## 2020-02-17 ENCOUNTER — Other Ambulatory Visit: Payer: Self-pay

## 2020-02-17 ENCOUNTER — Emergency Department (HOSPITAL_COMMUNITY): Payer: Medicare Other

## 2020-02-17 ENCOUNTER — Emergency Department (HOSPITAL_COMMUNITY)
Admission: EM | Admit: 2020-02-17 | Discharge: 2020-02-17 | Disposition: A | Discharge status: Home / Self Care | Payer: Medicare Other | Attending: Emergency Medicine | Admitting: Emergency Medicine

## 2020-02-17 DIAGNOSIS — M47817 Spondylosis without myelopathy or radiculopathy, lumbosacral region: Secondary | ICD-10-CM | POA: Diagnosis not present

## 2020-02-17 DIAGNOSIS — I700 Atherosclerosis of aorta: Secondary | ICD-10-CM | POA: Diagnosis not present

## 2020-02-17 DIAGNOSIS — M545 Low back pain, unspecified: Secondary | ICD-10-CM | POA: Diagnosis not present

## 2020-02-17 DIAGNOSIS — J9811 Atelectasis: Secondary | ICD-10-CM | POA: Diagnosis not present

## 2020-02-17 DIAGNOSIS — N281 Cyst of kidney, acquired: Secondary | ICD-10-CM | POA: Diagnosis not present

## 2020-02-17 DIAGNOSIS — K5730 Diverticulosis of large intestine without perforation or abscess without bleeding: Secondary | ICD-10-CM | POA: Diagnosis not present

## 2020-02-17 DIAGNOSIS — Z8679 Personal history of other diseases of the circulatory system: Secondary | ICD-10-CM

## 2020-02-17 LAB — BASIC METABOLIC PANEL
Anion gap: 5 (ref 5–15)
BUN: 9 mg/dL (ref 8–23)
CO2: 27 mmol/L (ref 22–32)
Calcium: 9.3 mg/dL (ref 8.9–10.3)
Chloride: 104 mmol/L (ref 98–111)
Creatinine, Ser: 0.73 mg/dL (ref 0.44–1.00)
GFR, Estimated: >60 mL/min (ref >60)
Glucose, Bld: 103 mg/dL — ABNORMAL HIGH (ref 70–99)
Potassium: 3.8 mmol/L (ref 3.5–5.1)
Sodium: 136 mmol/L (ref 135–145)

## 2020-02-17 LAB — CBC
HCT: 38.4 % (ref 36.0–46.0)
Hemoglobin: 12.7 g/dL (ref 12.0–15.0)
MCH: 29.7 pg (ref 26.0–34.0)
MCHC: 33.1 g/dL (ref 30.0–36.0)
MCV: 89.9 fL (ref 80.0–100.0)
Platelets: 247 10*3/uL (ref 150–400)
RBC: 4.27 MIL/uL (ref 3.87–5.11)
RDW: 13.3 % (ref 11.5–15.5)
WBC: 10.6 10*3/uL — ABNORMAL HIGH (ref 4.0–10.5)
nRBC: 0 % (ref 0.0–0.2)

## 2020-02-17 MED ORDER — HYDROCODONE-ACETAMINOPHEN 5-325 MG PO TABS: 1.0000 | ORAL_TABLET | Freq: Four times a day (QID) | ORAL | 0 refills | Status: DC | PRN

## 2020-02-17 MED ORDER — IOHEXOL 350 MG/ML SOLN
100.0000 mL | Freq: Once | INTRAVENOUS | Status: AC | PRN
  Administered 2020-02-17: 100 mL via INTRAVENOUS

## 2020-02-17 NOTE — Discharge Instructions (Addendum)
Make an appointment follow your primary care doctor.  CT scan of the chest abdomen and pelvis showed no significant abdominal aortic aneurysm.  Back in June it showed a 2.5 cm dilatation.  But that is now within normal limits.  For the back pain x-rays of the low back without any bony abnormalities.  Take the hydrocodone as needed for pain.  Rest.  Work note provided.  Return for any new or worse symptoms.

## 2020-02-17 NOTE — ED Notes (Signed)
Assumed care of pt at 0700 

## 2020-02-17 NOTE — ED Provider Notes (Addendum)
[facility] EMERGENCY DEPARTMENT Provider Note   CSN: [id number] Arrival date & time: 02/16/20  2325     History Chief Complaint  Patient presents with  . Back Pain    [name] is a 64 y.o. female.    Patient with complaint of low back pain midline back pain for 2-1/2 weeks.  Kind of waxes and wanes.  No fall or injury.  Triage she said this started about 20-30 last night but patient tells me its been kind of come and gone for about 2-1/2 weeks.  No weakness or numbness or shooting pain down the legs.  In addition patient has been concerned about her blood pressure.  She says that when she gets the pain it seems to go a lot higher.  She is followed by Dr. Dennard Schaumann primary care.  Repeat blood pressure here shows systolic in the low 102H.  Which is very reassuring.  Past medical history is significant for abdominal aortic aneurysm.  But patient without any anterior abdominal pain.        Past Medical History:  Diagnosis Date  . Abdominal aortic aneurysm (AAA) 3.0 cm to 5.0 cm in diameter in female [facility])    None seen on prior imaging?   Marland Kitchen Anxiety   . Cataract   . Chronic left shoulder pain   . Headache   . Hiatal hernia    small  . Hyperlipidemia   . Hypertension   . Pre-diabetes   . Schatzki's ring   . Tremor     Patient Active Problem List   Diagnosis Date Noted  . Hyperlipidemia   . Hypertension   . Abdominal aortic aneurysm (AAA) 3.0 cm to 5.0 cm in diameter in female [facility])   . Nausea without vomiting 12/06/2018  . Loss of weight 12/06/2018  . Controlled diabetes mellitus type 2 with complications ([place]) 85/27/7824  . Essential hypertension 04/25/2017  . Hyperlipidemia LDL goal <70 04/25/2017  . Carotid artery stenosis without cerebral infarction, bilateral 04/25/2017  . AAA (abdominal aortic aneurysm) without rupture ([place]) 04/25/2017  . History of colonic polyps   . Diverticulosis of colon without hemorrhage   . RUQ pain 02/21/2013  . Abdominal pain,  epigastric 01/05/2013  . Abdominal bruit 01/05/2013  . Esophageal dysphagia 01/05/2013  . Constipation 01/05/2013  . GERD (gastroesophageal reflux disease) 01/05/2013    Past Surgical History:  Procedure Laterality Date  . CARDIAC CATHETERIZATION  2005   "Ms. Shibley has essentially normal coronary arteries and normal left ventricular function.  She will be treated empirically with anti-reflux measures"  . CATARACT EXTRACTION, BILATERAL    . CHOLECYSTECTOMY    . COLONOSCOPY    06/16/2004   MPN:TIRWER rectum/Diminutive polyps of the splenic flexure and the cecum/The remainder of the colonic mucosa appeared normal pathology (hyperplastic polyps).  . COLONOSCOPY N/A 08/07/2015   Dr. Gala Romney: 6 mm descending colon tubular adenoma, multiple medium-mouthed diverticula in sigmoid colon. surveillance 2022  . ESOPHAGOGASTRODUODENOSCOPY  09/11/2001   XVQ:MGQQPYPPJ ring otherwise normal esophagus/Normal stomach/Normal D1 and D2 status post passage of a 56 [place] dilator  . ESOPHAGOGASTRODUODENOSCOPY N/A 11/11/2017   Procedure: ESOPHAGOGASTRODUODENOSCOPY (EGD);  Surgeon: Daneil Dolin, MD; mild Schatzki ring s/p dilation and mild erosive gastropathy.  . ESOPHAGOGASTRODUODENOSCOPY (EGD) WITH ESOPHAGEAL DILATION N/A 01/25/2013   Dr. Gala Romney- schatzki's ring- 52 F dilation. small hiatal hernia  . HYSTEROSCOPY WITH D & C N/A 03/06/2019   Procedure: DILATATION AND CURETTAGE /HYSTEROSCOPY;  Surgeon: Jonnie Kind, MD;  Location:  [facility] ORS;  Service: Gynecology;  Laterality: N/A;  . [name] DILATION N/A 11/11/2017   Procedure: [name] DILATION;  Surgeon: Daneil Dolin, MD;  Location: [facility] ENDO SUITE;  Service: Endoscopy;  Laterality: N/A;  . POLYPECTOMY N/A 03/06/2019   Procedure: POLYPECTOMY ( REMOVAL OF ENDOMETRIAL POLYP);  Surgeon: Jonnie Kind, MD;  Location: [facility] ORS;  Service: Gynecology;  Laterality: N/A;  . TUBAL LIGATION       OB History    Gravida  4   Para  3   Term  3   Preterm       AB  1   Living  3     SAB  1   TAB      Ectopic      Multiple      Live Births              Family History  Problem Relation Age of Onset  . Diabetes Other   . Diabetes Mother   . Heart disease Mother   . Heart disease Father   . Diabetes Sister   . Diabetes Brother   . Hypertension Brother   . Diabetes Daughter   . Hypertension Maternal Grandmother   . Stroke Maternal Grandfather   . Early death Paternal Grandfather   . Colon cancer Neg Hx   . Liver disease Neg Hx     Social History   Tobacco Use  . Smoking status: Former Smoker    Packs/day: 0.25    Years: 35.00    Pack years: 8.75    Types: Cigarettes    Quit date: 12/31/2012    Years since quitting: 7.1  . Smokeless tobacco: Never Used  Vaping Use  . Vaping Use: Never used  Substance Use Topics  . Alcohol use: Not Currently    Comment: occasionaly  . Drug use: No    Home Medications Prior to Admission medications   Medication Sig Start Date End Date Taking? Authorizing Provider  acetaminophen (TYLENOL) 500 MG tablet Take 500 mg by mouth every 6 (six) hours as needed for moderate pain or headache.    [provider]  ALPRAZolam Duanne Moron) 0.5 MG tablet TAKE 1 TABLET(0.5 MG) BY MOUTH TWICE DAILY AS NEEDED FOR ANXIETY 11/20/19   Susy Frizzle, MD  amLODipine (NORVASC) 10 MG tablet TAKE 1 TABLET(10 MG) BY MOUTH AT BEDTIME 12/12/19   Susy Frizzle, MD  aspirin EC 81 MG tablet Take 81 mg by mouth at bedtime.     [provider]  atorvastatin (LIPITOR) 80 MG tablet TAKE 1 TABLET BY MOUTH DAILY 12/17/19   Susy Frizzle, MD  cholecalciferol (VITAMIN D3) 25 MCG (1000 UT) tablet Take 2,000 Units by mouth daily.    [provider]  cloNIDine (CATAPRES) 0.1 MG tablet TAKE 1 TABLET BY MOUTH THREE TIMES DAILY AS NEEDED FOR HIGH BLOOD PRESSURE 06/18/19   Susy Frizzle, MD  Cyanocobalamin (B-12) 2500 MCG TABS Take 2,500 mcg by mouth daily.    [provider]    EPINEPHrine 0.3 mg/0.3 mL IJ SOAJ injection Inject 0.3 mg into the muscle as needed for anaphylaxis.  01/05/19   [provider]  escitalopram (LEXAPRO) 10 MG tablet Take 1 tablet (10 mg total) by mouth daily. 09/13/19   Susy Frizzle, MD  fluticasone (FLONASE) 50 MCG/ACT nasal spray Place 2 sprays into both nostrils daily. 04/17/19   Susy Frizzle, MD  hydrochlorothiazide (HYDRODIURIL) 25 MG tablet Take 1 tablet (25 mg  total) by mouth daily. 12/31/19   Susy Frizzle, MD  hydrOXYzine (ATARAX/VISTARIL) 25 MG tablet Take 0.5-1 tablets (12.5-25 mg total) by mouth every 8 (eight) hours as needed for itching. 12/31/18   Margarita Mail, PA-C  ipratropium (ATROVENT) 0.03 % nasal spray USE 2 SPRAYS IN EACH NOSTRIL EVERY 12 HOURS Patient taking differently: Place 2 sprays into both nostrils See admin instructions. Instill 2 sprays into each nostril each morning ,may do a second dose later as needed for allergies 02/05/19   Susy Frizzle, MD  LINZESS 145 MCG CAPS capsule TAKE 1 CAPSULE(145 MCG) BY MOUTH DAILY BEFORE BREAKFAST Patient taking differently: Take 145 mcg by mouth daily before breakfast.  07/18/19   Susy Frizzle, MD  loratadine (CLARITIN) 10 MG tablet Take 10 mg by mouth daily as needed for allergies.    [provider]  MAGNESIUM PO Take 330 mg by mouth daily.    [provider]  metoprolol succinate (TOPROL-XL) 100 MG 24 hr tablet Take 1 tablet (100 mg total) by mouth every evening. Take with or immediately following a meal. 04/24/19   Pickard, Cammie Mcgee, MD  mometasone (ELOCON) 0.1 % cream Apply 1 application topically daily as needed (for irritation).  01/05/19   [provider]  montelukast (SINGULAIR) 10 MG tablet TAKE 1 TABLET(10 MG) BY MOUTH AT BEDTIME Patient taking differently: Take 10 mg by mouth at bedtime.  08/13/19   Susy Frizzle, MD  Multiple Vitamins-Minerals (ZINC PO) Take 1 tablet by mouth daily.    [provider]   ondansetron (ZOFRAN) 4 MG tablet Take 1 tablet (4 mg total) by mouth every 8 (eight) hours as needed for nausea or vomiting. 08/23/19   Erenest Rasher, PA-C  pantoprazole (PROTONIX) 40 MG tablet TAKE 1 TABLET BY MOUTH TWICE DAILY 30 MINUTES BEFORE MEALS 12/20/19   Carlis Stable, NP  potassium chloride SA (KLOR-CON) 20 MEQ tablet TAKE 1 TABLET(20 MEQ) BY MOUTH DAILY 12/20/19   Susy Frizzle, MD  rosuvastatin (CRESTOR) 5 MG tablet Take 1 tablet (5 mg total) by mouth daily. 12/20/19   Susy Frizzle, MD  sodium chloride (OCEAN) 0.65 % SOLN nasal spray Place 1 spray into both nostrils as needed for congestion.    [provider]  tacrolimus (PROTOPIC) 0.1 % ointment Apply 1 application topically daily as needed (for irritation).  01/08/19   [provider]  triamcinolone cream (KENALOG) 0.1 % APPLY EXTERNALLY TO THE AFFECTED AREA TWICE DAILY Patient taking differently: Apply 1 application topically 2 (two) times daily as needed (Rash).  12/28/18   Susy Frizzle, MD  valsartan (DIOVAN) 320 MG tablet Take 1 tablet (320 mg total) by mouth daily. 10/16/19   Susy Frizzle, MD  vitamin C (ASCORBIC ACID) 250 MG tablet Take 500 mg by mouth daily.    [provider]  vitamin E 400 UNIT capsule Take 400 Units by mouth daily.    [provider]  zinc gluconate 50 MG tablet Take 50 mg by mouth daily.    [provider]    Allergies    Ciprofloxacin and Latex  Review of Systems   Review of Systems  Constitutional: Negative for chills and fever.  HENT: Negative for rhinorrhea and sore throat.   Eyes: Negative for visual disturbance.  Respiratory: Negative for cough and shortness of breath.   Cardiovascular: Negative for chest pain and leg swelling.  Gastrointestinal: Negative for abdominal pain, diarrhea, nausea and vomiting.  Genitourinary:  Negative for dysuria.  Musculoskeletal: Positive for back pain. Negative for neck pain.  Skin: Negative for  rash.  Neurological: Negative for dizziness, light-headedness and headaches.  Hematological: Does not bruise/bleed easily.  Psychiatric/Behavioral: Negative for confusion.    Physical Exam Updated Vital Signs BP (!) 151/77 (BP Location: Left Arm)   Pulse 76   Temp 98.2 F (36.8 C) (Oral)   Resp 16   Ht 1.702 m (5\' 7" )   Wt 77 kg   SpO2 99%   BMI 26.59 kg/m   Physical Exam Vitals and nursing note reviewed.  Constitutional:      General: She is not in acute distress.    Appearance: Normal appearance. She is well-developed.  HENT:     Head: Normocephalic and atraumatic.  Eyes:     Extraocular Movements: Extraocular movements intact.     Conjunctiva/sclera: Conjunctivae normal.     Pupils: Pupils are equal, round, and reactive to light.  Cardiovascular:     Rate and Rhythm: Normal rate and regular rhythm.     Heart sounds: No murmur heard.   Pulmonary:     Effort: Pulmonary effort is normal. No respiratory distress.     Breath sounds: Normal breath sounds.  Abdominal:     Palpations: Abdomen is soft.     Tenderness: There is no abdominal tenderness.  Musculoskeletal:        General: No tenderness. Normal range of motion.     Cervical back: Normal range of motion and neck supple.  Skin:    General: Skin is warm and dry.  Neurological:     General: No focal deficit present.     Mental Status: She is alert and oriented to person, place, and time.     Cranial Nerves: No cranial nerve deficit.     Sensory: No sensory deficit.     Motor: No weakness.     ED Results / Procedures / Treatments   Labs (all labs ordered are listed, but only abnormal results are displayed) Labs Reviewed  CBC  BASIC METABOLIC PANEL    EKG None  Radiology DG Lumbar Spine Complete  Result Date: 02/17/2020 CLINICAL DATA:  Back pain for 2 weeks. EXAM: LUMBAR SPINE - COMPLETE 4+ VIEW COMPARISON:  None. FINDINGS: There is no evidence of lumbar spine fracture. Alignment is normal. Narrow  intervertebral space is identified at L5-S1. Facet joint degenerative joint changes are noted throughout the mid to lower lumbar spine. IMPRESSION: No acute fracture or dislocation identified. Degenerative joint changes of lumbar spine. Electronically Signed   By: Abelardo Diesel M.D.   On: 02/17/2020 09:09    Procedures Procedures (including critical care time)  Medications Ordered in ED Medications - No data to display  ED Course  I have reviewed the triage vital signs and the nursing notes.  Pertinent labs & imaging results that were available during my care of the patient were reviewed by me and considered in my medical decision making (see chart for details).    MDM Rules/Calculators/A&P                          Patient's back pain is kind of upper lumbar area.  No palpable tenderness to abdominal exam.  Patient does have a history of abdominal aortic aneurysm last checked in June it was only 2.5 cm.  But since there is new pain in the back area although not severe and patient's had difficulty controlling her blood pressure  will go ahead and do CT angio of chest abdomen and pelvis to reevaluate this.  Patient's plain films of the lumbar back are negative.  For any bony abnormalities.  Patient does prefer to have the abdominal aortic aneurysm looked at again.  Ultrasound is not available here today.  She is in agreement to do the CT angio studies.  If those have nothing significant and I feel patient can be treated symptomatically.   Final Clinical Impression(s) / ED Diagnoses Final diagnoses:  Acute midline low back pain without sciatica  Abdominal aortic aneurysm (AAA) without rupture [facility])    Rx / DC Orders ED Discharge Orders    None       Fredia Sorrow, MD 02/17/20 [phone]  CT angio of the chest abdomen and pelvis without evidence of any significant findings other than atherosclerotic disease of the aorta.  No evidence of any significant aortic aneurysm.  We will treat  with pain medicine and have her follow-up with her primary care doctor.  X-rays of the back without any acute findings    Fredia Sorrow, MD 02/17/20 1105

## 2020-02-17 NOTE — ED Triage Notes (Signed)
Pt reports mid back pain that started at 2230 last night. Denies injury.

## 2020-02-18 ENCOUNTER — Other Ambulatory Visit: Payer: Self-pay | Admitting: *Deleted

## 2020-02-18 NOTE — Patient Outreach (Signed)
[place] [facility]) Care Management  02/18/2020  ALTHIA EGOLF November 21, 1955 [id number]  Call Nurse Hotline 02/16/2020  Telephone Assessment-Unsuccessful  RN initial outreach attempt unsuccessful. RN able to leave a HIPAA approved voice message requesting a call back.  Will send outreach letter and follow up over the next week.  Raina Mina, RN Care Management Coordinator [place] Office [phone]

## 2020-02-22 ENCOUNTER — Other Ambulatory Visit: Payer: Self-pay | Admitting: *Deleted

## 2020-02-22 NOTE — Patient Outreach (Signed)
[place] [facility]) Care Management  02/22/2020  [name] [birth year] [id number]   Telephone Assessment Nurse Hotline call back Outreach #2  RN attempted outreach today however unsuccessful. RN able to leave a HIPAA approved voice message requesting a call back.  Will make another outreach call over the next week for pending inquires.  Raina Mina, RN Care Management Coordinator [place] Office [phone]

## 2020-02-28 ENCOUNTER — Other Ambulatory Visit: Payer: Self-pay | Admitting: *Deleted

## 2020-02-28 ENCOUNTER — Other Ambulatory Visit: Payer: Self-pay

## 2020-02-28 ENCOUNTER — Ambulatory Visit (INDEPENDENT_AMBULATORY_CARE_PROVIDER_SITE_OTHER): Payer: Medicare Other | Admitting: Family Medicine

## 2020-02-28 VITALS — BP 150/78 | HR 74 | Temp 97.8°F | Ht 67.0 in | Wt 168.0 lb

## 2020-02-28 DIAGNOSIS — G250 Essential tremor: Secondary | ICD-10-CM | POA: Diagnosis not present

## 2020-02-28 DIAGNOSIS — M7050 Other bursitis of knee, unspecified knee: Secondary | ICD-10-CM

## 2020-02-28 MED ORDER — PRIMIDONE 50 MG PO TABS: 50.0000 mg | ORAL_TABLET | Freq: Two times a day (BID) | ORAL | 2 refills | Status: DC

## 2020-02-28 MED ORDER — DICLOFENAC SODIUM 75 MG PO TBEC: 75.0000 mg | DELAYED_RELEASE_TABLET | Freq: Two times a day (BID) | ORAL | 0 refills | Status: DC

## 2020-02-28 NOTE — Progress Notes (Signed)
Subjective:    Patient ID: [name], female    DOB: [birth year], 64 y.o.   MRN: [id number]  Patient presents for 2 specific problems.  First I saw the patient in September for left knee pain.  At that time I injected her knee with cortisone.  This helps her knee for approximately 2 weeks however she has since developed severe pain in the anterior portion of her left knee.  The pain is located inferior to the patella and medial to the patella on the tibial shaft near the pes anserine bursa.  She jumps with pain simply with me pressing in that area.  Therefore I believe she has pes anserine bursitis.  She is not taking any kind of anti-inflammatory.  She is taking Tylenol.  She also states that she is "tired of her tremor".  She states that she has had it for years.  She has a resting tremor in both hands that appears to be an essential tremor.  She also has a slight tremor in her head and neck this barely noticeable.  There is no pill-rolling quality.  There is no shuffling gait.  She has had a normal CT scan of the brain earlier this year Past Medical History:  Diagnosis Date  . Abdominal aortic aneurysm (AAA) 3.0 cm to 5.0 cm in diameter in female [facility])    None seen on prior imaging?   Marland Kitchen Anxiety   . Cataract   . Chronic left shoulder pain   . Headache   . Hiatal hernia    small  . Hyperlipidemia   . Hypertension   . Pre-diabetes   . Schatzki's ring   . Tremor    Past Surgical History:  Procedure Laterality Date  . CARDIAC CATHETERIZATION  2005   "Ms. Derryberry has essentially normal coronary arteries and normal left ventricular function.  She will be treated empirically with anti-reflux measures"  . CATARACT EXTRACTION, BILATERAL    . CHOLECYSTECTOMY    . COLONOSCOPY    06/16/2004   DDU:KGURKY rectum/Diminutive polyps of the splenic flexure and the cecum/The remainder of the colonic mucosa appeared normal pathology (hyperplastic polyps).  . COLONOSCOPY N/A 08/07/2015   Dr.  Gala Romney: 6 mm descending colon tubular adenoma, multiple medium-mouthed diverticula in sigmoid colon. surveillance 2022  . ESOPHAGOGASTRODUODENOSCOPY  09/11/2001   HCW:CBJSEGBTD ring otherwise normal esophagus/Normal stomach/Normal D1 and D2 status post passage of a 56 [place] dilator  . ESOPHAGOGASTRODUODENOSCOPY N/A 11/11/2017   Procedure: ESOPHAGOGASTRODUODENOSCOPY (EGD);  Surgeon: Daneil Dolin, MD; mild Schatzki ring s/p dilation and mild erosive gastropathy.  . ESOPHAGOGASTRODUODENOSCOPY (EGD) WITH ESOPHAGEAL DILATION N/A 01/25/2013   Dr. Gala Romney- schatzki's ring- 50 F dilation. small hiatal hernia  . HYSTEROSCOPY WITH D & C N/A 03/06/2019   Procedure: DILATATION AND CURETTAGE /HYSTEROSCOPY;  Surgeon: Jonnie Kind, MD;  Location: [facility];  Service: Gynecology;  Laterality: N/A;  . [name] DILATION N/A 11/11/2017   Procedure: [name] DILATION;  Surgeon: Daneil Dolin, MD;  Location: [facility] END[facility] SUITE;  Service: Endoscopy;  Laterality: N/A;  . POLYPECTOMY N/A 03/06/2019   Procedure: POLYPECTOMY ( REMOVAL OF ENDOMETRIAL POLYP);  Surgeon: Jonnie Kind, MD;  Location: [facility];  Service: Gynecology;  Laterality: N/A;  . TUBAL LIGATION     Current Outpatient Medications on File Prior to Visit  Medication Sig Dispense Refill  . acetaminophen (TYLENOL) 500 MG tablet Take 500 mg by mouth every 6 (six) hours as needed for moderate pain or headache.    Marland Kitchen  ALPRAZolam (XANAX) 0.5 MG tablet TAKE 1 TABLET(0.5 MG) BY MOUTH TWICE DAILY AS NEEDED FOR ANXIETY 60 tablet 0  . amLODipine (NORVASC) 10 MG tablet TAKE 1 TABLET(10 MG) BY MOUTH AT BEDTIME 90 tablet 3  . aspirin EC 81 MG tablet Take 81 mg by mouth at bedtime.     Marland Kitchen atorvastatin (LIPITOR) 80 MG tablet TAKE 1 TABLET BY MOUTH DAILY 90 tablet 3  . cholecalciferol (VITAMIN D3) 25 MCG (1000 UT) tablet Take 2,000 Units by mouth daily.    . cloNIDine (CATAPRES) 0.1 MG tablet TAKE 1 TABLET BY MOUTH THREE TIMES DAILY AS NEEDED FOR HIGH BLOOD PRESSURE 90  tablet 3  . Cyanocobalamin (B-12) 2500 MCG TABS Take 2,500 mcg by mouth daily.    Marland Kitchen EPINEPHrine 0.3 mg/0.3 mL IJ SOAJ injection Inject 0.3 mg into the muscle as needed for anaphylaxis.     Marland Kitchen escitalopram (LEXAPRO) 10 MG tablet Take 1 tablet (10 mg total) by mouth daily. 30 tablet 5  . fluticasone (FLONASE) 50 MCG/ACT nasal spray Place 2 sprays into both nostrils daily. 16 g 6  . hydrochlorothiazide (HYDRODIURIL) 25 MG tablet Take 1 tablet (25 mg total) by mouth daily. 30 tablet 11  . HYDROcodone-acetaminophen (NORCO/VICODIN) 5-325 MG tablet Take 1 tablet by mouth every 6 (six) hours as needed. 14 tablet 0  . hydrOXYzine (ATARAX/VISTARIL) 25 MG tablet Take 0.5-1 tablets (12.5-25 mg total) by mouth every 8 (eight) hours as needed for itching. 20 tablet 0  . ipratropium (ATROVENT) 0.03 % nasal spray USE 2 SPRAYS IN EACH NOSTRIL EVERY 12 HOURS (Patient taking differently: Place 2 sprays into both nostrils See admin instructions. Instill 2 sprays into each nostril each morning ,may do a second dose later as needed for allergies) 30 mL 12  . LINZESS 145 MCG CAPS capsule TAKE 1 CAPSULE(145 MCG) BY MOUTH DAILY BEFORE BREAKFAST (Patient taking differently: Take 145 mcg by mouth daily before breakfast. ) 30 capsule 3  . loratadine (CLARITIN) 10 MG tablet Take 10 mg by mouth daily as needed for allergies.    Marland Kitchen MAGNESIUM PO Take 330 mg by mouth daily.    . metoprolol succinate (TOPROL-XL) 100 MG 24 hr tablet Take 1 tablet (100 mg total) by mouth every evening. Take with or immediately following a meal. 90 tablet 3  . mometasone (ELOCON) 0.1 % cream Apply 1 application topically daily as needed (for irritation).     . montelukast (SINGULAIR) 10 MG tablet TAKE 1 TABLET(10 MG) BY MOUTH AT BEDTIME (Patient taking differently: Take 10 mg by mouth at bedtime. ) 30 tablet 3  . Multiple Vitamins-Minerals (ZINC PO) Take 1 tablet by mouth daily.    . ondansetron (ZOFRAN) 4 MG tablet Take 1 tablet (4 mg total) by mouth  every 8 (eight) hours as needed for nausea or vomiting. 30 tablet 1  . pantoprazole (PROTONIX) 40 MG tablet TAKE 1 TABLET BY MOUTH TWICE DAILY 30 MINUTES BEFORE MEALS 60 tablet 5  . potassium chloride SA (KLOR-CON) 20 MEQ tablet TAKE 1 TABLET(20 MEQ) BY MOUTH DAILY 30 tablet 5  . rosuvastatin (CRESTOR) 5 MG tablet Take 1 tablet (5 mg total) by mouth daily. 90 tablet 3  . sodium chloride (OCEAN) 0.65 % SOLN nasal spray Place 1 spray into both nostrils as needed for congestion.    . tacrolimus (PROTOPIC) 0.1 % ointment Apply 1 application topically daily as needed (for irritation).     . triamcinolone cream (KENALOG) 0.1 % APPLY EXTERNALLY TO THE AFFECTED AREA  TWICE DAILY (Patient taking differently: Apply 1 application topically 2 (two) times daily as needed (Rash). ) 45 g 0  . valsartan (DIOVAN) 320 MG tablet Take 1 tablet (320 mg total) by mouth daily. 90 tablet 1  . vitamin C (ASCORBIC ACID) 250 MG tablet Take 500 mg by mouth daily.    . vitamin E 400 UNIT capsule Take 400 Units by mouth daily.    Marland Kitchen zinc gluconate 50 MG tablet Take 50 mg by mouth daily.     No current facility-administered medications on file prior to visit.   Allergies  Allergen Reactions  . Ciprofloxacin Shortness Of Breath  . Latex Other (See Comments)    Burns skin   Social History   Socioeconomic History  . Marital status: Single    Spouse name: Not on file  . Number of children: 3  . Years of education: Not on file  . Highest education level: Not on file  Occupational History  . Occupation: English as a second language teacher: [employer]  Tobacco Use  . Smoking status: Former Smoker    Packs/day: 0.25    Years: 35.00    Pack years: 8.75    Types: Cigarettes    Quit date: 12/31/2012    Years since quitting: 7.1  . Smokeless tobacco: Never Used  Vaping Use  . Vaping Use: Never used  Substance and Sexual Activity  . Alcohol use: Not Currently    Comment: occasionaly  . Drug use: No  . Sexual activity:  Not Currently    Partners: Male    Birth control/protection: Post-menopausal, Surgical    Comment: tubal  Other Topics Concern  . Not on file  Social History Narrative   Eats all food groups.    Wears seatbelt.    Has 3 children.   Prior smoker.   Attends church.    Used to work in [employer].    Drives.   Enjoys going out with family.    Social Determinants of Health   Financial Resource Strain: Low Risk   . Difficulty of Paying Living Expenses: Not very hard  Food Insecurity:   . Worried About Charity fundraiser in the Last Year: Not on file  . Ran Out of Food in the Last Year: Not on file  Transportation Needs:   . Lack of Transportation (Medical): Not on file  . Lack of Transportation (Non-Medical): Not on file  Physical Activity:   . Days of Exercise per Week: Not on file  . Minutes of Exercise per Session: Not on file  Stress:   . Feeling of Stress : Not on file  Social Connections:   . Frequency of Communication with Friends and Family: Not on file  . Frequency of Social Gatherings with Friends and Family: Not on file  . Attends Religious Services: Not on file  . Active Member of Clubs or Organizations: Not on file  . Attends Archivist Meetings: Not on file  . Marital Status: Not on file  Intimate Partner Violence:   . Fear of Current or Ex-Partner: Not on file  . Emotionally Abused: Not on file  . Physically Abused: Not on file  . Sexually Abused: Not on file   Family History  Problem Relation Age of Onset  . Diabetes Other   . Diabetes Mother   . Heart disease Mother   . Heart disease Father   . Diabetes Sister   . Diabetes Brother   . Hypertension Brother   .  Diabetes Daughter   . Hypertension Maternal Grandmother   . Stroke Maternal Grandfather   . Early death Paternal Grandfather   . Colon cancer Neg Hx   . Liver disease Neg Hx       Review of Systems  Neurological: Positive for headaches.  All other systems reviewed and are  negative.      Objective:   Physical Exam Vitals reviewed.  Constitutional:      Appearance: She is well-developed.  Eyes:     General: No visual field deficit.    Extraocular Movements:     Right eye: Normal extraocular motion and no nystagmus.     Left eye: Normal extraocular motion and no nystagmus.     Pupils:     Right eye: Pupil is round and reactive.     Left eye: Pupil is round and reactive.  Cardiovascular:     Rate and Rhythm: Normal rate and regular rhythm.     Heart sounds: Normal heart sounds. No murmur heard.  No friction rub. No gallop.   Pulmonary:     Effort: Pulmonary effort is normal. No respiratory distress.     Breath sounds: Normal breath sounds. No stridor. No wheezing or rales.  Chest:     Chest wall: No tenderness.  Musculoskeletal:     Right hand: Normal. No swelling, deformity, tenderness or bony tenderness. Normal range of motion.     Left hand: Normal. No swelling, deformity, tenderness or bony tenderness. Normal range of motion.     Cervical back: Normal range of motion. No rigidity.     Left knee: No swelling or deformity. Normal range of motion. Tenderness present.       Legs:  Neurological:     Mental Status: She is alert and oriented to person, place, and time. Mental status is at baseline.     Cranial Nerves: No cranial nerve deficit, dysarthria or facial asymmetry.     Sensory: No sensory deficit.     Motor: Tremor present. No weakness or abnormal muscle tone.     Coordination: Romberg sign negative. Coordination normal.     Gait: Gait normal.           Assessment & Plan:  Essential tremor  Pes anserine bursitis  Treat pes anserine bursitis with diclofenac 75 mg twice daily and use icing the affected area.  Treat her tremor by beginning primidone 50 mg twice daily and slowly uptitrate to a maximum of 250 mg daily.  Patient would like a second opinion with a neurologist which I will be glad to arrange.  If her knee is not  improving I would recommend orthopedic consultation

## 2020-02-28 NOTE — Patient Outreach (Signed)
[facility]) Care Management  02/28/2020  BAILYNN DYK 1955-10-05 [id number]   Telephone Assessment-Unsuccessful  Outreach #3 RN attempted outreach call however unsuccessful and no response to the outreach letter sent several weeks ago. RN able to leave a HIPAA approved voice message requesting a call back.  Will complete another outreach call next month per workflow on pending services.  Raina Mina, RN Care Management Coordinator [place] Office [phone]

## 2020-02-29 NOTE — Progress Notes (Signed)
Assessment/Plan:   1.  Essential Tremor.  -This is evidenced by the symmetrical nature, although her R is much worse than the L,  and longstanding hx of gradually getting worse.  We discussed nature and pathophysiology.  We discussed that this can continue to gradually get worse with time.  We discussed that some medications can worsen this, as can caffeine use.  We discussed medication therapy as well as surgical therapy.  Ultimately, the patient decided to start on the primidone she was given by her primary care doctor.  A titration schedule was given.  She will start primidone, 50 mg, half tablet at night for a week, then 1 tablet at night for a week and then, if tolerated, she will increase to 1 tablet twice per day.  R/B/SE were discussed.  The opportunity to ask questions was given and they were answered to the best of my ability.  The patient expressed understanding and willingness to follow the outlined treatment protocols.  -She and I did discuss surgical interventions.  She is not interested right now.   Subjective:   [name] was seen today in the movement disorders clinic for neurologic consultation at the request of Susy Frizzle, MD.  The consultation is for the evaluation of essential tremor.  Records made available to me are reviewed, including records from Dr. Rexene Alberts back in 2015.  Dr. Rexene Alberts felt that the patient had essential tremor, but most of that visit was really focused on her sleep apnea -"we can consider symptomatic treatment of her tremors down the road but for now I would suggest we focus on the sleep test."  Tremor: Yes.     How long has it been going on? Started in her 80s  At rest or with activation? Activation -  Started in L hand but now in both hands for last 6+ years  When is it noted the most?  Writing, eating, picking up things  Fam hx of tremor?  maternal uncle had tremor, but in the setting of alcoholism.  Affected by caffeine: doesn't drink  caffeine  Affected by alcohol: doesn't drink alcohol  Affected by stress:  No.  Affected by fatigue:  No.  Spills soup if on spoon:  Yes.    Spills glass of liquid if full:  Yes.    Affects ADL's (tying shoes, brushing teeth, etc):  No.  Tremor inducing meds:  Yes.   singulair (been on for about a year)  Tremor improving meds:  Xanax (takes it 1/2 tablet qoday); metoprolol -100 mg; primidone 50 mg bid on the list but pt states that she never took it (just written 02/28/20)    CT brain done December 18, 2019.  This was unremarkable.    ALLERGIES:   Allergies  Allergen Reactions  . Ciprofloxacin Shortness Of Breath  . Latex Other (See Comments)    Burns skin    CURRENT MEDICATIONS:  Current Outpatient Medications  Medication Instructions  . acetaminophen (TYLENOL) 500 mg, Oral, Every 6 hours PRN  . ALPRAZolam (XANAX) 0.5 MG tablet TAKE 1 TABLET(0.5 MG) BY MOUTH TWICE DAILY AS NEEDED FOR ANXIETY  . amLODipine (NORVASC) 10 MG tablet TAKE 1 TABLET(10 MG) BY MOUTH AT BEDTIME  . aspirin EC 81 mg, Oral, Daily at bedtime  . atorvastatin (LIPITOR) 80 MG tablet TAKE 1 TABLET BY MOUTH DAILY  . B-12 2,500 mcg, Oral, Daily  . cholecalciferol (VITAMIN D3) 2,000 Units, Oral, Daily  . cloNIDine (CATAPRES) 0.1 MG tablet TAKE  1 TABLET BY MOUTH THREE TIMES DAILY AS NEEDED FOR HIGH BLOOD PRESSURE  . diclofenac (VOLTAREN) 75 mg, Oral, 2 times daily  . EPINEPHrine (EPI-PEN) 0.3 mg, Intramuscular, As needed  . escitalopram (LEXAPRO) 10 mg, Oral, Daily  . fluticasone (FLONASE) 50 MCG/ACT nasal spray 2 sprays, Each Nare, Daily  . hydrochlorothiazide (HYDRODIURIL) 25 mg, Oral, Daily  . hydrOXYzine (ATARAX/VISTARIL) 12.5-25 mg, Oral, Every 8 hours PRN  . ipratropium (ATROVENT) 0.03 % nasal spray USE 2 SPRAYS IN EACH NOSTRIL EVERY 12 HOURS  . LINZESS 145 MCG CAPS capsule TAKE 1 CAPSULE(145 MCG) BY MOUTH DAILY BEFORE BREAKFAST  . loratadine (CLARITIN) 10 mg, Oral, Daily PRN  . MAGNESIUM PO 330 mg, Oral,  Daily  . metoprolol succinate (TOPROL-XL) 100 mg, Oral, Every evening, Take with or immediately following a meal.  . mometasone (ELOCON) 0.1 % cream 1 application, Topical, Daily PRN  . montelukast (SINGULAIR) 10 MG tablet TAKE 1 TABLET(10 MG) BY MOUTH AT BEDTIME  . ondansetron (ZOFRAN) 4 mg, Oral, Every 8 hours PRN  . pantoprazole (PROTONIX) 40 MG tablet TAKE 1 TABLET BY MOUTH TWICE DAILY 30 MINUTES BEFORE MEALS  . potassium chloride SA (KLOR-CON) 20 MEQ tablet TAKE 1 TABLET(20 MEQ) BY MOUTH DAILY  . primidone (MYSOLINE) 50 mg, Oral, 2 times daily  . rosuvastatin (CRESTOR) 5 mg, Oral, Daily  . sodium chloride (OCEAN) 0.65 % SOLN nasal spray 1 spray, Each Nare, As needed  . tacrolimus (PROTOPIC) 0.1 % ointment 1 application, Topical, Daily PRN  . triamcinolone cream (KENALOG) 0.1 % APPLY EXTERNALLY TO THE AFFECTED AREA TWICE DAILY  . valsartan (DIOVAN) 320 mg, Oral, Daily  . vitamin C (ASCORBIC ACID) 500 mg, Oral, Daily  . vitamin E 400 Units, Oral, Daily  . zinc gluconate 50 mg, Oral, Daily    Objective:   PHYSICAL EXAMINATION:    VITALS:   Vitals:   03/04/20 0933  BP: (!) 174/74  Pulse: 68  SpO2: 100%  Weight: 172 lb (78 kg)  Height: 5\' 6"  (1.676 m)    GEN:  The patient appears stated age and is in NAD. HEENT:  Normocephalic, atraumatic.  The mucous membranes are moist. The superficial temporal arteries are without ropiness or tenderness. CV:  RRR Lungs:  CTAB Neck/HEME:  There are no carotid bruits bilaterally.  Neurological examination:  Orientation: The patient is alert and oriented x3.  Cranial nerves: There is good facial symmetry.  Extraocular muscles are intact. The visual fields are full to confrontational testing. The speech is fluent and clear. Soft palate rises symmetrically and there is no tongue deviation. Hearing is intact to conversational tone. Sensation: Sensation is intact to light touch throughout (facial, trunk, extremities). There is no extinction with  double simultaneous stimulation.  Motor: Strength is 5/5 in the bilateral upper and lower extremities.   Shoulder shrug is equal and symmetric.  There is no pronator drift. Deep tendon reflexes: Deep tendon reflexes are 2/4 at the bilateral biceps, triceps, brachioradialis, right patella (has pain at the left patella and defers exam) and achilles. Plantar responses are downgoing bilaterally.  Movement examination: Tone: There is normal tone in the bilateral upper extremities.  The tone in the lower extremities is normal.  Abnormal movements: There is no rest tremor.  There is minimal postural tremor bilaterally.  There is at least moderate intention tremor on the right and mild on the left.  She has difficulty with Archimedes spirals bilaterally, right greater than left.  While tremor is evident when she  pours water with the right hand, she does not spill the water (but she is being very careful). Coordination:  There is no decremation with RAM's, with any form of RAMS, including alternating supination and pronation of the forearm, hand opening and closing, finger taps, heel taps and toe taps. Gait and Station: The patient has no difficulty arising out of a deep-seated chair without the use of the hands. The patient's stride length is good.   I have reviewed and interpreted the following labs independently   Chemistry      Component Value Date/Time   NA 136 02/17/2020 0934   NA 141 11/16/2012 0000   K 3.8 02/17/2020 0934   K 4.1 11/16/2012 0000   CL 104 02/17/2020 0934   CO2 27 02/17/2020 0934   BUN 9 02/17/2020 0934   BUN 11 11/16/2012 0000   CREATININE 0.73 02/17/2020 0934   CREATININE 0.87 10/29/2019 1157      Component Value Date/Time   CALCIUM 9.3 02/17/2020 0934   CALCIUM 9.7 11/16/2012 0000   ALKPHOS 87 03/02/2019 1257   ALKPHOS 77 11/16/2012 0000   AST 18 07/31/2019 1552   AST 18 11/16/2012 0000   ALT 16 07/31/2019 1552   BILITOT 0.6 07/31/2019 1552   BILITOT 0.8 11/16/2012  0000      Lab Results  Component Value Date   TSH 1.84 10/29/2019   Lab Results  Component Value Date   WBC 10.6 (H) 02/17/2020   HGB 12.7 02/17/2020   HCT 38.4 02/17/2020   MCV 89.9 02/17/2020   PLT 247 02/17/2020      Total time spent on today's visit was 45 minutes, including both face-to-face time and nonface-to-face time.  Time included that spent on review of records (prior notes available to me/labs/imaging if pertinent), discussing treatment and goals, answering patient's questions and coordinating care.  Cc:  Susy Frizzle, MD

## 2020-03-04 ENCOUNTER — Other Ambulatory Visit: Payer: Self-pay

## 2020-03-04 ENCOUNTER — Ambulatory Visit: Payer: Medicare Other | Admitting: Neurology

## 2020-03-04 ENCOUNTER — Encounter: Payer: Self-pay | Admitting: Neurology

## 2020-03-04 VITALS — BP 174/74 | HR 68 | Ht 66.0 in | Wt 172.0 lb

## 2020-03-04 DIAGNOSIS — G250 Essential tremor: Secondary | ICD-10-CM

## 2020-03-04 NOTE — Patient Instructions (Addendum)
Start primidone, 50 mg, 1/2 tablet at bed for a week and then you can increase to 1 tablet at bed for a week and as long as you tolerate the medication , you can increase to 1 tablet twice per day.  Let me know if you have problems with the medication.   Essential Tremor A tremor is trembling or shaking that a person cannot control. Most tremors affect the hands or arms. Tremors can also affect the head, vocal cords, legs, and other parts of the body. Essential tremor is a tremor without a known cause. Usually, it occurs while a person is trying to perform an action. It tends to get worse gradually as a person ages. What are the causes? The cause of this condition is not known. What increases the risk? You are more likely to develop this condition if:  You have a family member with essential tremor.  You are age 66 or older.  You take certain medicines. What are the signs or symptoms? The main sign of a tremor is a rhythmic shaking of certain parts of your body that is uncontrolled and unintentional. You may:  Have difficulty eating with a spoon or fork.  Have difficulty writing.  Nod your head up and down or side to side.  Have a quivering voice. The shaking may:  Get worse over time.  Come and go.  Be more noticeable on one side of your body.  Get worse due to stress, fatigue, caffeine, and extreme heat or cold. How is this diagnosed? This condition may be diagnosed based on:  Your symptoms and medical history.  A physical exam. There is no single test to diagnose an essential tremor. However, your health care provider may order tests to rule out other causes of your condition. These may include:  Blood and urine tests.  Imaging studies of your brain, such as CT scan and MRI.  A test that measures involuntary muscle movement (electromyogram). How is this treated? Treatment for essential tremor depends on the severity of the condition.  Some tremors may go away  without treatment.  Mild tremors may not need treatment if they do not affect your day-to-day life.  Severe tremors may need to be treated using one or more of the following options: ? Medicines. ? Lifestyle changes. ? Occupational or physical therapy. Follow these instructions at home: Lifestyle   Do not use any products that contain nicotine or tobacco, such as cigarettes and e-cigarettes. If you need help quitting, ask your health care provider.  Limit your caffeine intake as told by your health care provider.  Try to get 8 hours of sleep each night.  Find ways to manage your stress that fits your lifestyle and personality. Consider trying meditation or yoga.  Try to anticipate stressful situations and allow extra time to manage them.  If you are struggling emotionally with the effects of your tremor, consider working with a mental health provider. General instructions  Take over-the-counter and prescription medicines only as told by your health care provider.  Avoid extreme heat and extreme cold.  Keep all follow-up visits as told by your health care provider. This is important. Visits may include physical therapy visits. Contact a health care provider if:  You experience any changes in the location or intensity of your tremors.  You start having a tremor after starting a new medicine.  You have tremor with other symptoms, such as: ? Numbness. ? Tingling. ? Pain. ? Weakness.  Your tremor  gets worse.  Your tremor interferes with your daily life.  You feel down, blue, or sad for at least 2 weeks in a row.  Worrying about your tremor and what other people think about you interferes with your everyday life functions, including relationships, work, or school. Summary  Essential tremor is a tremor without a known cause. Usually, it occurs when you are trying to perform an action.  The cause of this condition is not known.  The main sign of a tremor is a rhythmic  shaking of certain parts of your body that is uncontrolled and unintentional.  Treatment for essential tremor depends on the severity of the condition. This information is not intended to replace advice given to you by your health care provider. Make sure you discuss any questions you have with your health care provider. Document Revised: 05/06/2017 Document Reviewed: 05/06/2017 Elsevier Patient Education  2020 [place] physicians and staff at [facility] are committed to providing excellent care. You may receive a survey requesting feedback about your experience at our office. We strive to receive "very good" responses to the survey questions. If you feel that your experience would prevent you from giving the office a "very good " response, please contact our office to try to remedy the situation. We may be reached at [phone]. Thank you for taking the time out of your busy day to complete the survey.

## 2020-03-07 ENCOUNTER — Other Ambulatory Visit: Payer: Self-pay | Admitting: Family Medicine

## 2020-03-07 DIAGNOSIS — J302 Other seasonal allergic rhinitis: Secondary | ICD-10-CM

## 2020-03-07 DIAGNOSIS — J329 Chronic sinusitis, unspecified: Secondary | ICD-10-CM

## 2020-03-10 DIAGNOSIS — M25562 Pain in left knee: Secondary | ICD-10-CM | POA: Diagnosis not present

## 2020-03-10 DIAGNOSIS — S8392XA Sprain of unspecified site of left knee, initial encounter: Secondary | ICD-10-CM | POA: Diagnosis not present

## 2020-03-11 DIAGNOSIS — S8392XA Sprain of unspecified site of left knee, initial encounter: Secondary | ICD-10-CM | POA: Diagnosis not present

## 2020-03-19 ENCOUNTER — Other Ambulatory Visit: Payer: Self-pay

## 2020-03-19 ENCOUNTER — Ambulatory Visit: Payer: Medicare Other | Admitting: Vascular Surgery

## 2020-03-19 ENCOUNTER — Encounter: Payer: Self-pay | Admitting: Vascular Surgery

## 2020-03-19 VITALS — BP 153/77 | HR 67 | Temp 97.9°F | Resp 18 | Ht 66.0 in | Wt 167.6 lb

## 2020-03-19 DIAGNOSIS — I83813 Varicose veins of bilateral lower extremities with pain: Secondary | ICD-10-CM

## 2020-03-19 NOTE — Progress Notes (Signed)
Patient is a 64 year old female who returns for follow-up today.  She was last seen by one of our PAs approximately 3 months ago.  At that time she was complaining of leg swelling right greater than left and varicose veins with pain aching and swelling.  She was given a prescription for compression stockings.  Her duplex ultrasound did show some reflux in the mid thigh but the saphenofemoral junction valves were intact with no reflux.  She has been compliant wearing her compression stockings.  She has not really noticed significant improvement in her symptoms.  She states the worst problem is when she goes walking the veins in her right leg become irritated.  She has no history of prior skin breakdown.  She has no history of DVT.  Past Medical History:  Diagnosis Date  . Abdominal aortic aneurysm (AAA) 3.0 cm to 5.0 cm in diameter in female [facility])    None seen on prior imaging?   Marland Kitchen Anxiety   . Cataract   . Chronic left shoulder pain   . Headache   . Hiatal hernia    small  . Hyperlipidemia   . Hypertension   . Pre-diabetes   . Schatzki's ring   . Seasonal allergies   . Tremor     Past Surgical History:  Procedure Laterality Date  . CARDIAC CATHETERIZATION  2005   "[name] has essentially normal coronary arteries and normal left ventricular function.  She will be treated empirically with anti-reflux measures"  . CATARACT EXTRACTION, BILATERAL    . CHOLECYSTECTOMY    . COLONOSCOPY    06/16/2004   IPJ:ASNKNL rectum/Diminutive polyps of the splenic flexure and the cecum/The remainder of the colonic mucosa appeared normal pathology (hyperplastic polyps).  . COLONOSCOPY N/A 08/07/2015   Dr. Gala Williams: 6 mm descending colon tubular adenoma, multiple medium-mouthed diverticula in sigmoid colon. surveillance 2022  . ESOPHAGOGASTRODUODENOSCOPY  09/11/2001   ZJQ:[id number] ring otherwise normal esophagus/Normal stomach/Normal D1 and D2 status post passage of a 56 [place] dilator  .  ESOPHAGOGASTRODUODENOSCOPY N/A 11/11/2017   Procedure: ESOPHAGOGASTRODUODENOSCOPY (EGD);  Surgeon: Kimberly Dolin, MD; mild Schatzki ring s/p dilation and mild erosive gastropathy.  . ESOPHAGOGASTRODUODENOSCOPY (EGD) WITH ESOPHAGEAL DILATION N/A 01/25/2013   Dr. Gala Williams- schatzki's ring- 83 F dilation. small hiatal hernia  . HYSTEROSCOPY WITH D & C N/A 03/06/2019   Procedure: DILATATION AND CURETTAGE /HYSTEROSCOPY;  Surgeon: Kimberly Kind, MD;  Location: [facility];  Service: Gynecology;  Laterality: N/A;  . [name] DILATION N/A 11/11/2017   Procedure: [name] DILATION;  Surgeon: Kimberly Dolin, MD;  Location: [facility];  Service: Endoscopy;  Laterality: N/A;  . POLYPECTOMY N/A 03/06/2019   Procedure: POLYPECTOMY ( REMOVAL OF ENDOMETRIAL POLYP);  Surgeon: Kimberly Kind, MD;  Location: [facility];  Service: Gynecology;  Laterality: N/A;  . TUBAL LIGATION      Current Outpatient Medications on File Prior to Visit  Medication Sig Dispense Refill  . acetaminophen (TYLENOL) 500 MG tablet Take 500 mg by mouth every 6 (six) hours as needed for moderate pain or headache.    . ALPRAZolam (XANAX) 0.5 MG tablet TAKE 1 TABLET(0.5 MG) BY MOUTH TWICE DAILY AS NEEDED FOR ANXIETY 60 tablet 0  . amLODipine (NORVASC) 10 MG tablet TAKE 1 TABLET(10 MG) BY MOUTH AT BEDTIME 90 tablet 3  . aspirin EC 81 MG tablet Take 81 mg by mouth at bedtime.     . cholecalciferol (VITAMIN D3) 25 MCG (1000 UT) tablet Take 2,000 Units by  mouth daily.    . cloNIDine (CATAPRES) 0.1 MG tablet TAKE 1 TABLET BY MOUTH THREE TIMES DAILY AS NEEDED FOR HIGH BLOOD PRESSURE 90 tablet 3  . Cyanocobalamin (B-12) 2500 MCG TABS Take 2,500 mcg by mouth daily.    . diclofenac (VOLTAREN) 75 MG EC tablet Take 1 tablet (75 mg total) by mouth 2 (two) times daily. 30 tablet 0  . EPINEPHrine 0.3 mg/0.3 mL IJ SOAJ injection Inject 0.3 mg into the muscle as needed for anaphylaxis.     Marland Kitchen escitalopram (LEXAPRO) 10 MG tablet Take 1 tablet (10 mg total) by mouth  daily. 30 tablet 5  . fluticasone (FLONASE) 50 MCG/ACT nasal spray SHAKE LIQUID AND USE 2 SPRAYS IN EACH NOSTRIL DAILY 16 g 6  . hydrochlorothiazide (HYDRODIURIL) 25 MG tablet Take 1 tablet (25 mg total) by mouth daily. 30 tablet 11  . hydrOXYzine (ATARAX/VISTARIL) 25 MG tablet Take 0.5-1 tablets (12.5-25 mg total) by mouth every 8 (eight) hours as needed for itching. 20 tablet 0  . ipratropium (ATROVENT) 0.03 % nasal spray INSTILL 2 SPRAYS IN EACH NOSTRIL EVERY 12 HOURS 30 mL 12  . LINZESS 145 MCG CAPS capsule TAKE 1 CAPSULE(145 MCG) BY MOUTH DAILY BEFORE BREAKFAST (Patient taking differently: Take 145 mcg by mouth daily before breakfast. ) 30 capsule 3  . loratadine (CLARITIN) 10 MG tablet Take 10 mg by mouth daily as needed for allergies.    Marland Kitchen MAGNESIUM PO Take 330 mg by mouth daily.    . metoprolol succinate (TOPROL-XL) 100 MG 24 hr tablet Take 1 tablet (100 mg total) by mouth every evening. Take with or immediately following a meal. 90 tablet 3  . mometasone (ELOCON) 0.1 % cream Apply 1 application topically daily as needed (for irritation).     . montelukast (SINGULAIR) 10 MG tablet TAKE 1 TABLET(10 MG) BY MOUTH AT BEDTIME (Patient taking differently: Take 10 mg by mouth at bedtime. ) 30 tablet 3  . ondansetron (ZOFRAN) 4 MG tablet Take 1 tablet (4 mg total) by mouth every 8 (eight) hours as needed for nausea or vomiting. 30 tablet 1  . pantoprazole (PROTONIX) 40 MG tablet TAKE 1 TABLET BY MOUTH TWICE DAILY 30 MINUTES BEFORE MEALS 60 tablet 5  . potassium chloride SA (KLOR-CON) 20 MEQ tablet TAKE 1 TABLET(20 MEQ) BY MOUTH DAILY 30 tablet 5  . primidone (MYSOLINE) 50 MG tablet Take 1 tablet (50 mg total) by mouth in the morning and at bedtime. 60 tablet 2  . rosuvastatin (CRESTOR) 5 MG tablet Take 1 tablet (5 mg total) by mouth daily. 90 tablet 3  . sodium chloride (OCEAN) 0.65 % SOLN nasal spray Place 1 spray into both nostrils as needed for congestion.    . tacrolimus (PROTOPIC) 0.1 %  ointment Apply 1 application topically daily as needed (for irritation).     . triamcinolone cream (KENALOG) 0.1 % APPLY EXTERNALLY TO THE AFFECTED AREA TWICE DAILY (Patient taking differently: Apply 1 application topically 2 (two) times daily as needed (Rash). ) 45 g 0  . valsartan (DIOVAN) 320 MG tablet Take 1 tablet (320 mg total) by mouth daily. 90 tablet 1  . vitamin C (ASCORBIC ACID) 250 MG tablet Take 500 mg by mouth daily.    . vitamin E 400 UNIT capsule Take 400 Units by mouth daily.    Marland Kitchen zinc gluconate 50 MG tablet Take 50 mg by mouth daily.    Marland Kitchen atorvastatin (LIPITOR) 80 MG tablet TAKE 1 TABLET BY MOUTH DAILY 90  tablet 3   No current facility-administered medications on file prior to visit.     Family History  Problem Relation Age of Onset  . Diabetes Other   . Diabetes Mother   . Heart disease Mother   . Heart disease Father   . Diabetes Sister   . Diabetes Brother   . Hypertension Brother   . Diabetes Daughter   . Hypertension Maternal Grandmother   . Stroke Maternal Grandfather   . Early death Paternal Grandfather   . Colon cancer Neg Hx   . Liver disease Neg Hx      Physical exam:  Vitals:   03/19/20 1331  BP: (!) 153/77  Pulse: 67  Resp: 18  Temp: 97.9 F (36.6 C)  TempSrc: Temporal  SpO2: 100%  Weight: 167 lb 9.6 oz (76 kg)  Height: 5\' 6"  (1.676 m)    Extremities: 2+ dorsalis pedis pulses bilaterally  Skin: Cluster of varicosities right medial thigh right medial calf 4 to 5 mm diameter easily palpable several areas of reticular type varicosities left and right leg  Data: I reviewed the patient's previous venous duplex exam dated December 07, 2019.  Saphenous vein diameter was 6 mm in the mid thigh on the right with reflux in the mid thigh but no reflux at the junction.  Left leg study was not performed.  Assessment: Symptomatic varicose veins right lower extremity with pain.  Discussed with patient today the pathophysiology of arterial and venous  disease.  Also discussed with her continue to wear compression stockings elevating her legs at the end of the day.  We did not note reflux on previous venous duplex exam at the saphenofemoral junction.  In light of this most likely conservative management with compression alone will be her main option for now.  Plan: Patient will follow up with [place] in 3 months time with a repeat venous reflux exam.  We will be both legs at that office visit since she complains of pain in both legs.  She also swelling in both legs.  If the study shows reflux at the saphenofemoral junction we could consider laser ablation.  Otherwise continue compression stockings.  Kimberly Hinds, MD [facility] Office: [phone]

## 2020-03-21 ENCOUNTER — Ambulatory Visit: Payer: Medicare Other | Admitting: Orthopedic Surgery

## 2020-03-24 ENCOUNTER — Other Ambulatory Visit: Payer: Self-pay | Admitting: Family Medicine

## 2020-03-24 ENCOUNTER — Telehealth: Payer: Self-pay | Admitting: Neurology

## 2020-03-24 ENCOUNTER — Other Ambulatory Visit: Payer: Self-pay

## 2020-03-24 DIAGNOSIS — I83813 Varicose veins of bilateral lower extremities with pain: Secondary | ICD-10-CM

## 2020-03-24 NOTE — Telephone Encounter (Signed)
Spoke with Dr Tat and she states recommends the patient take the Primidone in the mornings. She states the patients other concerns should be addressed by the patients pcp.   Spoke with patient and gave her Dr Doristine Devoid recommendations and she voiced understanding.

## 2020-03-24 NOTE — Telephone Encounter (Signed)
Patient called in. She has been having dreams for the last 3 months and she wakes up with high blood pressure due to them. She stated her doctor who prescribes her blood pressure medication knows about these dreams, but doesn't want to change her blood pressure medication. She stated she was bit by a tick back in June and was treated for it. She is afraid the dreams could be something in her brain.

## 2020-03-27 ENCOUNTER — Ambulatory Visit (INDEPENDENT_AMBULATORY_CARE_PROVIDER_SITE_OTHER): Payer: Medicare Other | Admitting: Family Medicine

## 2020-03-27 ENCOUNTER — Other Ambulatory Visit: Payer: Self-pay

## 2020-03-27 VITALS — BP 150/80 | HR 66 | Temp 98.0°F | Ht 66.0 in | Wt 172.0 lb

## 2020-03-27 DIAGNOSIS — I10 Essential (primary) hypertension: Secondary | ICD-10-CM

## 2020-03-27 MED ORDER — DOXAZOSIN MESYLATE 2 MG PO TABS: 2.0000 mg | ORAL_TABLET | Freq: Every day | ORAL | 3 refills | Status: DC

## 2020-03-27 NOTE — Progress Notes (Signed)
Subjective:    Patient ID: [name], female    DOB: June 11, 1955, 64 y.o.   MRN: [id number]  Patient states that she is not sleeping well.  She wakes up every night usually after having vivid dreams.  She then will check her blood pressure and will be extremely high in the 786-767 range systolic.  She is taking clonidine every night.  She also reports a headache at night when she wakes.  She is also having more fasciculations in her left eye now that she is not sleeping well.  She reports a pulsing headache on the left side.  CT scan of the brain in August was normal.  She denies any neurologic deficits.  There is no weakness on one side of the body.  Cranial nerves II through XII are grossly intact with muscle strength 5/5 equal and symmetric in the upper and lower extremities.  She has stopped the primidone that she was taking for tremor due to dizziness and "feeling funny". Past Medical History:  Diagnosis Date  . Abdominal aortic aneurysm (AAA) 3.0 cm to 5.0 cm in diameter in female [facility])    None seen on prior imaging?   Marland Kitchen Anxiety   . Cataract   . Chronic left shoulder pain   . Headache   . Hiatal hernia    small  . Hyperlipidemia   . Hypertension   . Pre-diabetes   . Schatzki's ring   . Seasonal allergies   . Tremor    Past Surgical History:  Procedure Laterality Date  . CARDIAC CATHETERIZATION  2005   "Ms. Freeze has essentially normal coronary arteries and normal left ventricular function.  She will be treated empirically with anti-reflux measures"  . CATARACT EXTRACTION, BILATERAL    . CHOLECYSTECTOMY    . COLONOSCOPY    06/16/2004   MCN:OBSJGG rectum/Diminutive polyps of the splenic flexure and the cecum/The remainder of the colonic mucosa appeared normal pathology (hyperplastic polyps).  . COLONOSCOPY N/A 08/07/2015   Dr. Gala Romney: 6 mm descending colon tubular adenoma, multiple medium-mouthed diverticula in sigmoid colon. surveillance 2022  .  ESOPHAGOGASTRODUODENOSCOPY  09/11/2001   EZM:[id number] ring otherwise normal esophagus/Normal stomach/Normal D1 and D2 status post passage of a 56 [place] dilator  . ESOPHAGOGASTRODUODENOSCOPY N/A 11/11/2017   Procedure: ESOPHAGOGASTRODUODENOSCOPY (EGD);  Surgeon: Daneil Dolin, MD; mild Schatzki ring s/p dilation and mild erosive gastropathy.  . ESOPHAGOGASTRODUODENOSCOPY (EGD) WITH ESOPHAGEAL DILATION N/A 01/25/2013   Dr. Gala Romney- schatzki's ring- 44 F dilation. small hiatal hernia  . HYSTEROSCOPY WITH D & C N/A 03/06/2019   Procedure: DILATATION AND CURETTAGE /HYSTEROSCOPY;  Surgeon: Jonnie Kind, MD;  Location: [facility];  Service: Gynecology;  Laterality: N/A;  . [name] DILATION N/A 11/11/2017   Procedure: [name] DILATION;  Surgeon: Daneil Dolin, MD;  Location: [facility] SUITE;  Service: Endoscopy;  Laterality: N/A;  . POLYPECTOMY N/A 03/06/2019   Procedure: POLYPECTOMY ( REMOVAL OF ENDOMETRIAL POLYP);  Surgeon: Jonnie Kind, MD;  Location: [facility];  Service: Gynecology;  Laterality: N/A;  . TUBAL LIGATION     Current Outpatient Medications on File Prior to Visit  Medication Sig Dispense Refill  . acetaminophen (TYLENOL) 500 MG tablet Take 500 mg by mouth every 6 (six) hours as needed for moderate pain or headache.    . ALPRAZolam (XANAX) 0.5 MG tablet TAKE 1 TABLET(0.5 MG) BY MOUTH TWICE DAILY AS NEEDED FOR ANXIETY 60 tablet 0  . amLODipine (NORVASC) 10 MG tablet TAKE 1 TABLET(10 MG)  BY MOUTH AT BEDTIME 90 tablet 3  . aspirin EC 81 MG tablet Take 81 mg by mouth at bedtime.     . cholecalciferol (VITAMIN D3) 25 MCG (1000 UT) tablet Take 2,000 Units by mouth daily.    . Cyanocobalamin (B-12) 2500 MCG TABS Take 2,500 mcg by mouth daily.    . diclofenac (VOLTAREN) 75 MG EC tablet Take 1 tablet (75 mg total) by mouth 2 (two) times daily. 30 tablet 0  . EPINEPHrine 0.3 mg/0.3 mL IJ SOAJ injection Inject 0.3 mg into the muscle as needed for anaphylaxis.     Marland Kitchen escitalopram (LEXAPRO) 10  MG tablet Take 1 tablet (10 mg total) by mouth daily. 30 tablet 5  . fluticasone (FLONASE) 50 MCG/ACT nasal spray SHAKE LIQUID AND USE 2 SPRAYS IN EACH NOSTRIL DAILY 16 g 6  . hydrochlorothiazide (HYDRODIURIL) 25 MG tablet Take 1 tablet (25 mg total) by mouth daily. 30 tablet 11  . hydrOXYzine (ATARAX/VISTARIL) 25 MG tablet Take 0.5-1 tablets (12.5-25 mg total) by mouth every 8 (eight) hours as needed for itching. 20 tablet 0  . ipratropium (ATROVENT) 0.03 % nasal spray INSTILL 2 SPRAYS IN EACH NOSTRIL EVERY 12 HOURS 30 mL 12  . LINZESS 145 MCG CAPS capsule TAKE 1 CAPSULE(145 MCG) BY MOUTH DAILY BEFORE BREAKFAST 30 capsule 3  . loratadine (CLARITIN) 10 MG tablet Take 10 mg by mouth daily as needed for allergies.    Marland Kitchen MAGNESIUM PO Take 330 mg by mouth daily.    . metoprolol succinate (TOPROL-XL) 100 MG 24 hr tablet Take 1 tablet (100 mg total) by mouth every evening. Take with or immediately following a meal. 90 tablet 3  . mometasone (ELOCON) 0.1 % cream Apply 1 application topically daily as needed (for irritation).     . montelukast (SINGULAIR) 10 MG tablet TAKE 1 TABLET(10 MG) BY MOUTH AT BEDTIME (Patient taking differently: Take 10 mg by mouth at bedtime. ) 30 tablet 3  . ondansetron (ZOFRAN) 4 MG tablet Take 1 tablet (4 mg total) by mouth every 8 (eight) hours as needed for nausea or vomiting. 30 tablet 1  . pantoprazole (PROTONIX) 40 MG tablet TAKE 1 TABLET BY MOUTH TWICE DAILY 30 MINUTES BEFORE MEALS 60 tablet 5  . potassium chloride SA (KLOR-CON) 20 MEQ tablet TAKE 1 TABLET(20 MEQ) BY MOUTH DAILY 30 tablet 5  . rosuvastatin (CRESTOR) 5 MG tablet Take 1 tablet (5 mg total) by mouth daily. 90 tablet 3  . sodium chloride (OCEAN) 0.65 % SOLN nasal spray Place 1 spray into both nostrils as needed for congestion.    . tacrolimus (PROTOPIC) 0.1 % ointment Apply 1 application topically daily as needed (for irritation).     . triamcinolone cream (KENALOG) 0.1 % APPLY EXTERNALLY TO THE AFFECTED AREA  TWICE DAILY (Patient taking differently: Apply 1 application topically 2 (two) times daily as needed (Rash). ) 45 g 0  . valsartan (DIOVAN) 320 MG tablet Take 1 tablet (320 mg total) by mouth daily. 90 tablet 1  . vitamin C (ASCORBIC ACID) 250 MG tablet Take 500 mg by mouth daily.    . vitamin E 400 UNIT capsule Take 400 Units by mouth daily.    Marland Kitchen zinc gluconate 50 MG tablet Take 50 mg by mouth daily.    Marland Kitchen atorvastatin (LIPITOR) 80 MG tablet TAKE 1 TABLET BY MOUTH DAILY 90 tablet 3  . primidone (MYSOLINE) 50 MG tablet Take 1 tablet (50 mg total) by mouth in the morning and at  bedtime. (Patient not taking: Reported on 03/27/2020) 60 tablet 2   No current facility-administered medications on file prior to visit.   Allergies  Allergen Reactions  . Ciprofloxacin Shortness Of Breath  . Latex Other (See Comments)    Burns skin   Social History   Socioeconomic History  . Marital status: Single    Spouse name: Not on file  . Number of children: 3  . Years of education: Not on file  . Highest education level: Not on file  Occupational History  . Occupation: English as a second language teacher: [employer]  Tobacco Use  . Smoking status: Former Smoker    Packs/day: 0.25    Years: 35.00    Pack years: 8.75    Types: Cigarettes    Quit date: 12/31/2012    Years since quitting: 7.2  . Smokeless tobacco: Never Used  Vaping Use  . Vaping Use: Never used  Substance and Sexual Activity  . Alcohol use: Not Currently  . Drug use: No  . Sexual activity: Not Currently    Partners: Male    Birth control/protection: Post-menopausal, Surgical    Comment: tubal  Other Topics Concern  . Not on file  Social History Narrative   Eats all food groups.    Wears seatbelt.    Has 3 children.   Prior smoker.   Attends church.    Used to work in [employer] fund[employer].    Drives.   Enjoys going out with family.    Social Determinants of Health   Financial Resource Strain: Low Risk   . Difficulty of Paying  Living Expenses: Not very hard  Food Insecurity:   . Worried About Charity fundraiser in the Last Year: Not on file  . Ran Out of Food in the Last Year: Not on file  Transportation Needs:   . Lack of Transportation (Medical): Not on file  . Lack of Transportation (Non-Medical): Not on file  Physical Activity:   . Days of Exercise per Week: Not on file  . Minutes of Exercise per Session: Not on file  Stress:   . Feeling of Stress : Not on file  Social Connections:   . Frequency of Communication with Friends and Family: Not on file  . Frequency of Social Gatherings with Friends and Family: Not on file  . Attends Religious Services: Not on file  . Active Member of Clubs or Organizations: Not on file  . Attends Archivist Meetings: Not on file  . Marital Status: Not on file  Intimate Partner Violence:   . Fear of Current or Ex-Partner: Not on file  . Emotionally Abused: Not on file  . Physically Abused: Not on file  . Sexually Abused: Not on file   Family History  Problem Relation Age of Onset  . Diabetes Other   . Diabetes Mother   . Heart disease Mother   . Heart disease Father   . Diabetes Sister   . Diabetes Brother   . Hypertension Brother   . Diabetes Daughter   . Hypertension Maternal Grandmother   . Stroke Maternal Grandfather   . Early death Paternal Grandfather   . Colon cancer Neg Hx   . Liver disease Neg Hx       Review of Systems  Neurological: Positive for headaches.  All other systems reviewed and are negative.      Objective:   Physical Exam Vitals reviewed.  Constitutional:      Appearance: She is well-developed.  Eyes:     General: No visual field deficit.    Extraocular Movements:     Right eye: Normal extraocular motion and no nystagmus.     Left eye: Normal extraocular motion and no nystagmus.     Pupils:     Right eye: Pupil is round and reactive.     Left eye: Pupil is round and reactive.  Cardiovascular:     Rate and  Rhythm: Normal rate and regular rhythm.     Heart sounds: Normal heart sounds. No murmur heard.  No friction rub. No gallop.   Pulmonary:     Effort: Pulmonary effort is normal. No respiratory distress.     Breath sounds: Normal breath sounds. No stridor. No wheezing or rales.  Chest:     Chest wall: No tenderness.  Musculoskeletal:     Right hand: Normal. No swelling, deformity, tenderness or bony tenderness. Normal range of motion.     Left hand: Normal. No swelling, deformity, tenderness or bony tenderness. Normal range of motion.     Cervical back: Normal range of motion. No rigidity.  Neurological:     Mental Status: She is alert and oriented to person, place, and time. Mental status is at baseline.     Cranial Nerves: No cranial nerve deficit, dysarthria or facial asymmetry.     Sensory: No sensory deficit.     Motor: Tremor present. No weakness or abnormal muscle tone.     Coordination: Romberg sign negative. Coordination normal.     Gait: Gait normal.           Assessment & Plan:  Essential hypertension  I do not feel that the clonidine is giving her adequate 24-hour day coverage.  Therefore I recommended she stop the clonidine.  Continue metoprolol, hydrochlorothiazide, valsartan, and amlodipine.  Add Cardura 2 mg at night.  I believe the pulsing headache could be due to her high blood pressure at night.  We will uptitrate Cardura to achieve systolic blood pressure less than 140.  Meanwhile use Xanax 0.5 mg 30 minutes before sleep to see if she will sleep better.  I believe some of the fasciculations in her eyelid are likely due to fatigue due to lack of sleep as she states that she is waking up frequently at night.  I hope that the Xanax will help with that.

## 2020-03-31 ENCOUNTER — Other Ambulatory Visit: Payer: Self-pay | Admitting: *Deleted

## 2020-03-31 NOTE — Patient Outreach (Signed)
[facility]) Care Management  03/31/2020  [name] 1955-09-04 [id number]   Case closure (Unsuccessful 4th outreach)  RN attempted the 4th outreach call however unsuccessful and no response from the outreach letter sent to pt last month. Based upon the unsuccessful contacts.   Will close this case and notify the provider with an update on pt's disposition with [facility] services.  Raina Mina, RN Care Management Coordinator [place] [facility] [phone]

## 2020-04-08 ENCOUNTER — Other Ambulatory Visit: Payer: Self-pay | Admitting: Family Medicine

## 2020-04-08 DIAGNOSIS — F419 Anxiety disorder, unspecified: Secondary | ICD-10-CM

## 2020-04-18 ENCOUNTER — Other Ambulatory Visit: Payer: Self-pay | Admitting: Family Medicine

## 2020-05-06 ENCOUNTER — Other Ambulatory Visit: Payer: Self-pay | Admitting: Family Medicine

## 2020-05-06 DIAGNOSIS — F419 Anxiety disorder, unspecified: Secondary | ICD-10-CM

## 2020-05-06 NOTE — Telephone Encounter (Signed)
Ok to refill??  Last office visit 03/27/2020.  Last refill 04/08/2020.

## 2020-05-09 ENCOUNTER — Other Ambulatory Visit: Payer: Self-pay | Admitting: Family Medicine

## 2020-05-11 ENCOUNTER — Emergency Department (HOSPITAL_COMMUNITY): Payer: Medicare Other

## 2020-05-11 ENCOUNTER — Other Ambulatory Visit: Payer: Self-pay

## 2020-05-11 ENCOUNTER — Emergency Department (HOSPITAL_COMMUNITY)
Admission: EM | Admit: 2020-05-11 | Discharge: 2020-05-11 | Disposition: A | Discharge status: Home / Self Care | Payer: Medicare Other | Attending: Emergency Medicine | Admitting: Emergency Medicine

## 2020-05-11 DIAGNOSIS — R202 Paresthesia of skin: Secondary | ICD-10-CM | POA: Diagnosis not present

## 2020-05-11 DIAGNOSIS — Z87891 Personal history of nicotine dependence: Secondary | ICD-10-CM | POA: Diagnosis not present

## 2020-05-11 DIAGNOSIS — I10 Essential (primary) hypertension: Secondary | ICD-10-CM | POA: Insufficient documentation

## 2020-05-11 DIAGNOSIS — Z7982 Long term (current) use of aspirin: Secondary | ICD-10-CM | POA: Insufficient documentation

## 2020-05-11 DIAGNOSIS — R42 Dizziness and giddiness: Secondary | ICD-10-CM | POA: Diagnosis not present

## 2020-05-11 DIAGNOSIS — Z79899 Other long term (current) drug therapy: Secondary | ICD-10-CM | POA: Diagnosis not present

## 2020-05-11 DIAGNOSIS — R519 Headache, unspecified: Secondary | ICD-10-CM | POA: Diagnosis not present

## 2020-05-11 DIAGNOSIS — Z91040 Latex allergy status: Secondary | ICD-10-CM | POA: Diagnosis not present

## 2020-05-11 DIAGNOSIS — E119 Type 2 diabetes mellitus without complications: Secondary | ICD-10-CM | POA: Diagnosis not present

## 2020-05-11 LAB — CBC
HCT: 37.2 % (ref 36.0–46.0)
Hemoglobin: 12.5 g/dL (ref 12.0–15.0)
MCH: 30.9 pg (ref 26.0–34.0)
MCHC: 33.6 g/dL (ref 30.0–36.0)
MCV: 91.9 fL (ref 80.0–100.0)
Platelets: 248 10*3/uL (ref 150–400)
RBC: 4.05 MIL/uL (ref 3.87–5.11)
RDW: 12.6 % (ref 11.5–15.5)
WBC: 8.3 10*3/uL (ref 4.0–10.5)
nRBC: 0 % (ref 0.0–0.2)

## 2020-05-11 LAB — BASIC METABOLIC PANEL
Anion gap: 11 (ref 5–15)
BUN: 10 mg/dL (ref 8–23)
CO2: 28 mmol/L (ref 22–32)
Calcium: 9.7 mg/dL (ref 8.9–10.3)
Chloride: 103 mmol/L (ref 98–111)
Creatinine, Ser: 0.86 mg/dL (ref 0.44–1.00)
GFR, Estimated: >60 mL/min (ref >60)
Glucose, Bld: 110 mg/dL — ABNORMAL HIGH (ref 70–99)
Potassium: 3.9 mmol/L (ref 3.5–5.1)
Sodium: 142 mmol/L (ref 135–145)

## 2020-05-11 LAB — TROPONIN I (HIGH SENSITIVITY)
Troponin I (High Sensitivity): 8 ng/L (ref <18)
Troponin I (High Sensitivity): 8 ng/L (ref <18)

## 2020-05-11 MED ORDER — ACETAMINOPHEN 325 MG PO TABS
650.0000 mg | ORAL_TABLET | Freq: Once | ORAL | Status: DC
  Filled 2020-05-11: qty 2

## 2020-05-11 NOTE — ED Provider Notes (Signed)
[facility]ERGENCY DEPARTMENT Provider Note   CSN: [id number] Arrival date & time: 05/11/20  [id number]     History Chief Complaint  Patient presents with  . Hypertension  . Headache  . Chest Pain    [name] is a 65 y.o. female.  The history is provided by the patient and medical records.  Hypertension Associated symptoms include chest pain and headaches.  Headache Chest Pain Associated symptoms: headache    [name] is a 65 y.o. female who presents to the Emergency Department complaining of hypertension, numbness. She presents the emergency department complaining of elevated blood pressure and intermittent numbness. Symptoms have been ongoing for several months. She reports that for several months she wakes up in the middle the night after having dreams and checks her blood pressure and it is elevated. She states that she feels swim he headed at the time. She then later goes back to sleep potential wake up with tingling to her left temple and cheek with an occipital headache as well as tingling in the left or right fingertips. Symptoms lasted for a few minutes and then they resolved. She checks her blood pressure at night when she wakes up and notices that is usually [id number] to [id number] systolic. Last night her blood pressure was 222/118 in her symptoms were more intense and that prompted her emergency department visit. She reports chronic issues with nausea. Denies any chest pain, shortness of breath, vomiting. Currently her symptoms are nearly resolved. She has minimal left sided headache. She also reports nasal congestion and runny nose with slight right ear discomfort for the last several weeks. No known COVID-19 exposure she has been fully vaccinated for COVID-19. She is compliant with her home medications.    Past Medical History:  Diagnosis Date  . Abdominal aortic aneurysm (AAA) 3.0 cm to 5.0 cm in diameter in female [facility])    None seen on prior  imaging?   Marland Kitchen Anxiety   . Cataract   . Chronic left shoulder pain   . Headache   . Hiatal hernia    small  . Hyperlipidemia   . Hypertension   . Pre-diabetes   . Schatzki's ring   . Seasonal allergies   . Tremor     Patient Active Problem List   Diagnosis Date Noted  . Hyperlipidemia   . Hypertension   . Abdominal aortic aneurysm (AAA) 3.0 cm to 5.0 cm in diameter in female [facility])   . Nausea without vomiting 12/06/2018  . Loss of weight 12/06/2018  . Controlled diabetes mellitus type 2 with complications ([place]) [id number]  . Essential hypertension 04/25/2017  . Hyperlipidemia LDL goal <70 04/25/2017  . Carotid artery stenosis without cerebral infarction, bilateral 04/25/2017  . AAA (abdominal aortic aneurysm) without rupture ([place]) 04/25/2017  . History of colonic polyps   . Diverticulosis of colon without hemorrhage   . RUQ pain 02/21/2013  . Abdominal pain, epigastric 01/05/2013  . Abdominal bruit 01/05/2013  . Esophageal dysphagia 01/05/2013  . Constipation 01/05/2013  . GERD (gastroesophageal reflux disease) 01/05/2013    Past Surgical History:  Procedure Laterality Date  . CARDIAC CATHETERIZATION  2005   "Ms. Gagnon has essentially normal coronary arteries and normal left ventricular function.  She will be treated empirically with anti-reflux measures"  . CATARACT EXTRACTION, BILATERAL    . CHOLECYSTECTOMY    . COLONOSCOPY    06/16/2004   [id number] rectum/Diminutive polyps of the splenic flexure and the cecum/The remainder of the  colonic mucosa appeared normal pathology (hyperplastic polyps).  . COLONOSCOPY N/A 08/07/2015   Dr. Jena Gauss: 6 mm descending colon tubular adenoma, multiple medium-mouthed diverticula in sigmoid colon. surveillance 2022  . ESOPHAGOGASTRODUODENOSCOPY  09/11/2001   NKN:LZJQBHALP ring otherwise normal esophagus/Normal stomach/Normal D1 and D2 status post passage of a 56 [place] dilator  . ESOPHAGOGASTRODUODENOSCOPY N/A 11/11/2017    Procedure: ESOPHAGOGASTRODUODENOSCOPY (EGD);  Surgeon: Corbin Ade, MD; mild Schatzki ring s/p dilation and mild erosive gastropathy.  . ESOPHAGOGASTRODUODENOSCOPY (EGD) WITH ESOPHAGEAL DILATION N/A 01/25/2013   Dr. Jena Gauss- schatzki's ring- 54 F dilation. small hiatal hernia  . HYSTEROSCOPY WITH D & C N/A 03/06/2019   Procedure: DILATATION AND CURETTAGE /HYSTEROSCOPY;  Surgeon: Tilda Burrow, MD;  Location: [facility] ORS;  Service: Gynecology;  Laterality: N/A;  . Elease Hashimoto DILATION N/A 11/11/2017   Procedure: Elease Hashimoto DILATION;  Surgeon: Corbin Ade, MD;  Location: [facility] ENDO SUITE;  Service: Endoscopy;  Laterality: N/A;  . POLYPECTOMY N/A 03/06/2019   Procedure: POLYPECTOMY ( REMOVAL OF ENDOMETRIAL POLYP);  Surgeon: Tilda Burrow, MD;  Location: [facility] ORS;  Service: Gynecology;  Laterality: N/A;  . TUBAL LIGATION       OB History    Gravida  4   Para  3   Term  3   Preterm      AB  1   Living  3     SAB  1   IAB      Ectopic      Multiple      Live Births              Family History  Problem Relation Age of Onset  . Diabetes Other   . Diabetes Mother   . Heart disease Mother   . Heart disease Father   . Diabetes Sister   . Diabetes Brother   . Hypertension Brother   . Diabetes Daughter   . Hypertension Maternal Grandmother   . Stroke Maternal Grandfather   . Early death Paternal Grandfather   . Colon cancer Neg Hx   . Liver disease Neg Hx     Social History   Tobacco Use  . Smoking status: Former Smoker    Packs/day: 0.25    Years: 35.00    Pack years: 8.75    Types: Cigarettes    Quit date: 12/31/2012    Years since quitting: 7.3  . Smokeless tobacco: Never Used  Vaping Use  . Vaping Use: Never used  Substance Use Topics  . Alcohol use: Not Currently  . Drug use: No    Home Medications Prior to Admission medications   Medication Sig Start Date End Date Taking? Authorizing Provider  ALPRAZolam (XANAX) 0.5 MG tablet TAKE 1 TABLET(0.5 MG) BY  MOUTH TWICE DAILY AS NEEDED FOR ANXIETY 05/06/20   [name], MD  acetaminophen (TYLENOL) 500 MG tablet Take 500 mg by mouth every 6 (six) hours as needed for moderate pain or headache.    [provider]  amLODipine (NORVASC) 10 MG tablet TAKE 1 TABLET(10 MG) BY MOUTH AT BEDTIME 12/12/19   [name], MD  aspirin EC 81 MG tablet Take 81 mg by mouth at bedtime.     [provider]  atorvastatin (LIPITOR) 80 MG tablet TAKE 1 TABLET BY MOUTH DAILY 12/17/19   [name], MD  cholecalciferol (VITAMIN D3) 25 MCG (1000 UT) tablet Take 2,000 Units by mouth daily.    [provider]  Cyanocobalamin (B-12) 2500 MCG  TABS Take 2,500 mcg by mouth daily.    [provider]  diclofenac (VOLTAREN) 75 MG EC tablet TAKE 1 TABLET(75 MG) BY MOUTH TWICE DAILY 04/18/20   Susy Frizzle, MD  doxazosin (CARDURA) 2 MG tablet Take 1 tablet (2 mg total) by mouth daily. Stop clonidine 03/27/20   Susy Frizzle, MD  EPINEPHrine 0.3 mg/0.3 mL IJ SOAJ injection Inject 0.3 mg into the muscle as needed for anaphylaxis.  01/05/19   [provider]  escitalopram (LEXAPRO) 10 MG tablet Take 1 tablet (10 mg total) by mouth daily. 09/13/19   Susy Frizzle, MD  fluticasone (FLONASE) 50 MCG/ACT nasal spray SHAKE LIQUID AND USE 2 SPRAYS IN [facility] NOSTRIL DAILY 03/07/20   Susy Frizzle, MD  hydrochlorothiazide (HYDRODIURIL) 25 MG tablet Take 1 tablet (25 mg total) by mouth daily. 12/31/19   Susy Frizzle, MD  hydrOXYzine (ATARAX/VISTARIL) 25 MG tablet Take 0.5-1 tablets (12.5-25 mg total) by mouth every 8 (eight) hours as needed for itching. 12/31/18   Margarita Mail, PA-C  ipratropium (ATROVENT) 0.03 % nasal spray INSTILL 2 SPRAYS IN [facility] NOSTRIL EVERY 12 HOURS 03/07/20   Susy Frizzle, MD  LINZESS 145 MCG CAPS capsule TAKE 1 CAPSULE(145 MCG) BY MOUTH DAILY BEFORE BREAKFAST 03/24/20   Susy Frizzle, MD  loratadine (CLARITIN) 10 MG tablet Take 10 mg by mouth  daily as needed for allergies.    [provider]  MAGNESIUM PO Take 330 mg by mouth daily.    [provider]  metoprolol succinate (TOPROL-XL) 100 MG 24 hr tablet Take 1 tablet (100 mg total) by mouth every evening. Take with or immediately following a meal. 04/24/19   Pickard, Cammie Mcgee, MD  mometasone (ELOCON) 0.1 % cream Apply 1 application topically daily as needed (for irritation).  01/05/19   [provider]  montelukast (SINGULAIR) 10 MG tablet TAKE 1 TABLET(10 MG) BY MOUTH AT BEDTIME Patient taking differently: Take 10 mg by mouth at bedtime.  08/13/19   Susy Frizzle, MD  ondansetron (ZOFRAN) 4 MG tablet Take 1 tablet (4 mg total) by mouth every 8 (eight) hours as needed for nausea or vomiting. 08/23/19   Erenest Rasher, PA-C  pantoprazole (PROTONIX) 40 MG tablet TAKE 1 TABLET BY MOUTH TWICE DAILY 30 MINUTES BEFORE MEALS 12/20/19   Carlis Stable, NP  potassium chloride SA (KLOR-CON) 20 MEQ tablet TAKE 1 TABLET(20 MEQ) BY MOUTH DAILY 12/20/19   Susy Frizzle, MD  primidone (MYSOLINE) 50 MG tablet Take 1 tablet (50 mg total) by mouth in the morning and at bedtime. Patient not taking: Reported on 03/27/2020 02/28/20   Susy Frizzle, MD  rosuvastatin (CRESTOR) 5 MG tablet Take 1 tablet (5 mg total) by mouth daily. 12/20/19   Susy Frizzle, MD  sodium chloride (OCEAN) 0.65 % SOLN nasal spray Place 1 spray into both nostrils as needed for congestion.    [provider]  tacrolimus (PROTOPIC) 0.1 % ointment Apply 1 application topically daily as needed (for irritation).  01/08/19   [provider]  triamcinolone cream (KENALOG) 0.1 % APPLY EXTERNALLY TO THE AFFECTED AREA TWICE DAILY Patient taking differently: Apply 1 application topically 2 (two) times daily as needed (Rash).  12/28/18   Susy Frizzle, MD  valsartan (DIOVAN) 320 MG tablet Take 1 tablet (320 mg total) by mouth daily. 10/16/19   Susy Frizzle, MD  vitamin C (ASCORBIC  ACID) 250 MG tablet Take 500 mg by  mouth daily.    [provider]  vitamin E 400 UNIT capsule Take 400 Units by mouth daily.    [provider]  zinc gluconate 50 MG tablet Take 50 mg by mouth daily.    [provider]    Allergies    Ciprofloxacin and Latex  Review of Systems   Review of Systems  Cardiovascular: Positive for chest pain.  Neurological: Positive for headaches.  All other systems reviewed and are negative.   Physical Exam Updated Vital Signs BP (!) 140/54   Pulse (!) 52   Temp 98.6 F (37 C) (Oral)   Resp 14   SpO2 100%   Physical Exam Vitals and nursing note reviewed.  Constitutional:      Appearance: She is well-developed and well-nourished.  HENT:     Head: Normocephalic and atraumatic.     Right Ear: Tympanic membrane normal.     Left Ear: Tympanic membrane normal.  Cardiovascular:     Rate and Rhythm: Normal rate and regular rhythm.     Heart sounds: No murmur heard.   Pulmonary:     Effort: Pulmonary effort is normal. No respiratory distress.     Breath sounds: Normal breath sounds.  Abdominal:     Palpations: Abdomen is soft.     Tenderness: There is no abdominal tenderness. There is no guarding or rebound.  Musculoskeletal:        General: No tenderness or edema.  Skin:    General: Skin is warm and dry.  Neurological:     Mental Status: She is alert and oriented to person, place, and time.     Comments: Pupils equal round and reactive, EOMI. No asymmetry of facial movements. Five out of five strength in all four extremities with sensation light touch intact in all four extremities  Psychiatric:        Mood and Affect: Mood and affect normal.        Behavior: Behavior normal.     ED Results / Procedures / Treatments   Labs (all labs ordered are listed, but only abnormal results are displayed) Labs Reviewed  BASIC METABOLIC PANEL - Abnormal; Notable for the following components:      Result Value   Glucose,  Bld 110 (*)    All other components within normal limits  CBC  TROPONIN I (HIGH SENSITIVITY)  TROPONIN I (HIGH SENSITIVITY)    EKG EKG Interpretation  Date/Time:  Sunday May 11 2020 04:01:02 EST Ventricular Rate:  60 PR Interval:  180 QRS Duration: 80 QT Interval:  440 QTC Calculation: 440 R Axis:   43 Text Interpretation: Normal sinus rhythm Normal ECG Confirmed by Quintella Reichert [phone]) on 05/11/2020 7:43:33 AM   Radiology CT Head Wo Contrast  Result Date: 05/11/2020 CLINICAL DATA:  Headache. Dizziness. Intermittent left facial and left upper extremity numbness for months. No reported injury. EXAM: CT HEAD WITHOUT CONTRAST TECHNIQUE: Contiguous axial images were obtained from the base of the skull through the vertex without intravenous contrast. COMPARISON:  12/18/2019 head CT. FINDINGS: Brain: No evidence of parenchymal hemorrhage or extra-axial fluid collection. No mass lesion, mass effect, or midline shift. No CT evidence of acute infarction. Nonspecific mild subcortical and periventricular white matter hypodensity, most in keeping with chronic small vessel ischemic change. Cerebral volume is age appropriate. No ventriculomegaly. Vascular: No acute abnormality. Skull: No evidence of calvarial fracture. Sinuses/Orbits: The visualized paranasal sinuses are essentially clear. Other:  The mastoid air cells are unopacified. IMPRESSION: 1. No  evidence of acute intracranial abnormality. 2. Mild chronic small vessel ischemic changes in the cerebral white matter. Electronically Signed   By: Ilona Sorrel M.D.   On: 05/11/2020 08:24    Procedures Procedures (including critical care time)  Medications Ordered in ED Medications  acetaminophen (TYLENOL) tablet 650 mg (has no administration in time range)    ED Course  I have reviewed the triage vital signs and the nursing notes.  Pertinent labs & imaging results that were available during my care of the patient were reviewed by me and  considered in my medical decision making (see chart for details).    MDM Rules/Calculators/A&P                         patient with history of hypertension here for evaluation of several months of recurrent headaches and paresthesias that are temporary and transient. Overall her symptoms are essentially resolved on ED presentation. She did have significant hypertension prior to ED presentation, resolved on assessment. She has no focal neurologic deficits on exam. Imaging of her brain with chronic small vessel ischemic changes, no mass or acute stroke. Presentation is not consistent with dissection, subarachnoid hemorrhage, intracranial aneurysm. CBC and BMP essentially within normal limits. Presentation is not consistent with hypertensive emergency. Discussed with patient home care for recurrent headaches as well as paresthesias. Recommend neurology follow-up regarding her recurrent paresthesias and headaches as well as PCP follow-up regarding her blood pressure. Return precautions discussed.  Final Clinical Impression(s) / ED Diagnoses Final diagnoses:  Paresthesia  Bad headache    Rx / DC Orders ED Discharge Orders    None       Quintella Reichert, MD 05/11/20 [phone]

## 2020-05-11 NOTE — ED Triage Notes (Signed)
Pt said she has been having issues with her blood pressures and having headaches. Pt said the left side of her head has been tingling and making her feel dizzy. Pt saids her face feels like on the left side that it is clogged up. Pt said some discomfort in her upper gastric area. Pt said some nausea,

## 2020-05-21 NOTE — Progress Notes (Deleted)
Referring Provider: Susy Frizzle, MD Primary Care Physician:  Susy Frizzle, MD Primary GI Physician: Dr. Gala Romney  No chief complaint on file.   HPI:   [name] is a 64 y.o. female with history of constipation, GERD, nausea without vomiting, and dysphagia s/p dilation in 2019 withEGD revealing mild Schatzki ring s/p dilation and mild erosive gastropathy.  Delayed gastric emptying noted on GES in 2004, GESApril 2021 normal.  RUQ ultrasound April 2021 unrevealing s/p cholecystectomy. Colonoscopy in 2017 with 6 mm tubular adenoma, due for repeat colonoscopy in March 2022.  She has also reported chronic intermittent epigastric abdominal pain that seems to be associated with twisting or lying in certain positions suspected to be musculoskeletal in etiology.  She is presenting today for follow-up.  Last seen in our office 11/14/2019.  Had tried Dexilant after her prior visit in April 2021, but reported this caused diarrhea, so she had resume Protonix twice daily.  She felt nausea was improved, only had 2 episodes in the last 3 months.  No vomiting.  Admitted to eating within 3 hours of laying down and felt this may contribute to intermittent nausea. Weight was stable.  Admitted to occasionally eating within 3 hours of going to bed, but tried to avoid this.  Chronic intermittent epigastric pain stable.  Stated she would have no symptoms for months at a time then would occur for couple of days.  Pain is quick and sharp and does not linger.  Triggered by twisting and not associated with meals.  GERD fairly well controlled.  Breakthrough symptoms if taking Protonix too late in the morning.  Constipation well controlled on Linzess 145 mcg daily.  Plan to continue current medications, follow-up in 6 months.  Today:    Past Medical History:  Diagnosis Date  . Abdominal aortic aneurysm (AAA) 3.0 cm to 5.0 cm in diameter in female [facility])    None seen on prior imaging?   Marland Kitchen Anxiety   .  Cataract   . Chronic left shoulder pain   . Headache   . Hiatal hernia    small  . Hyperlipidemia   . Hypertension   . Pre-diabetes   . Schatzki's ring   . Seasonal allergies   . Tremor     Past Surgical History:  Procedure Laterality Date  . CARDIAC CATHETERIZATION  2005   "Ms. Devins has essentially normal coronary arteries and normal left ventricular function.  She will be treated empirically with anti-reflux measures"  . CATARACT EXTRACTION, BILATERAL    . CHOLECYSTECTOMY    . COLONOSCOPY    06/16/2004   UXL:KGMWNU rectum/Diminutive polyps of the splenic flexure and the cecum/The remainder of the colonic mucosa appeared normal pathology (hyperplastic polyps).  . COLONOSCOPY N/A 08/07/2015   Dr. Gala Romney: 6 mm descending colon tubular adenoma, multiple medium-mouthed diverticula in sigmoid colon. surveillance 2022  . ESOPHAGOGASTRODUODENOSCOPY  09/11/2001   UVO:[id number] ring otherwise normal esophagus/Normal stomach/Normal D1 and D2 status post passage of a 56 [place] Maloney dilator  . ESOPHAGOGASTRODUODENOSCOPY N/A 11/11/2017   Procedure: ESOPHAGOGASTRODUODENOSCOPY (EGD);  Surgeon: Daneil Dolin, MD; mild Schatzki ring s/p dilation and mild erosive gastropathy.  . ESOPHAGOGASTRODUODENOSCOPY (EGD) WITH ESOPHAGEAL DILATION N/A 01/25/2013   Dr. Gala Romney- schatzki's ring- 70 F dilation. small hiatal hernia  . HYSTEROSCOPY WITH D & C N/A 03/06/2019   Procedure: DILATATION AND CURETTAGE /HYSTEROSCOPY;  Surgeon: Jonnie Kind, MD;  Location: [facility] ORS;  Service: Gynecology;  Laterality: N/A;  . MALONEY DILATION  N/A 11/11/2017   Procedure: [name] DILATION;  Surgeon: Daneil Dolin, MD;  Location: [facility] ENDO SUITE;  Service: Endoscopy;  Laterality: N/A;  . POLYPECTOMY N/A 03/06/2019   Procedure: POLYPECTOMY ( REMOVAL OF ENDOMETRIAL POLYP);  Surgeon: Jonnie Kind, MD;  Location: [facility] ORS;  Service: Gynecology;  Laterality: N/A;  . TUBAL LIGATION      Current Outpatient Medications   Medication Sig Dispense Refill  . ALPRAZolam (XANAX) 0.5 MG tablet TAKE 1 TABLET(0.5 MG) BY MOUTH TWICE DAILY AS NEEDED FOR ANXIETY 60 tablet 0  . acetaminophen (TYLENOL) 500 MG tablet Take 500 mg by mouth every 6 (six) hours as needed for moderate pain or headache.    Marland Kitchen amLODipine (NORVASC) 10 MG tablet TAKE 1 TABLET(10 MG) BY MOUTH AT BEDTIME 90 tablet 3  . aspirin EC 81 MG tablet Take 81 mg by mouth at bedtime.     Marland Kitchen atorvastatin (LIPITOR) 80 MG tablet TAKE 1 TABLET BY MOUTH DAILY 90 tablet 3  . cholecalciferol (VITAMIN D3) 25 MCG (1000 UT) tablet Take 2,000 Units by mouth daily.    . Cyanocobalamin (B-12) 2500 MCG TABS Take 2,500 mcg by mouth daily.    . diclofenac (VOLTAREN) 75 MG EC tablet TAKE 1 TABLET(75 MG) BY MOUTH TWICE DAILY 30 tablet 0  . doxazosin (CARDURA) 2 MG tablet Take 1 tablet (2 mg total) by mouth daily. Stop clonidine 30 tablet 3  . EPINEPHrine 0.3 mg/0.3 mL IJ SOAJ injection Inject 0.3 mg into the muscle as needed for anaphylaxis.     Marland Kitchen escitalopram (LEXAPRO) 10 MG tablet Take 1 tablet (10 mg total) by mouth daily. 30 tablet 5  . fluticasone (FLONASE) 50 MCG/ACT nasal spray SHAKE LIQUID AND USE 2 SPRAYS IN EACH NOSTRIL DAILY 16 g 6  . hydrochlorothiazide (HYDRODIURIL) 25 MG tablet Take 1 tablet (25 mg total) by mouth daily. 30 tablet 11  . hydrOXYzine (ATARAX/VISTARIL) 25 MG tablet Take 0.5-1 tablets (12.5-25 mg total) by mouth every 8 (eight) hours as needed for itching. 20 tablet 0  . ipratropium (ATROVENT) 0.03 % nasal spray INSTILL 2 SPRAYS IN EACH NOSTRIL EVERY 12 HOURS 30 mL 12  . LINZESS 145 MCG CAPS capsule TAKE 1 CAPSULE(145 MCG) BY MOUTH DAILY BEFORE BREAKFAST 30 capsule 3  . loratadine (CLARITIN) 10 MG tablet Take 10 mg by mouth daily as needed for allergies.    Marland Kitchen MAGNESIUM PO Take 330 mg by mouth daily.    . metoprolol succinate (TOPROL-XL) 100 MG 24 hr tablet Take 1 tablet (100 mg total) by mouth every evening. Take with or immediately following a meal. 90  tablet 3  . mometasone (ELOCON) 0.1 % cream Apply 1 application topically daily as needed (for irritation).     . montelukast (SINGULAIR) 10 MG tablet TAKE 1 TABLET(10 MG) BY MOUTH AT BEDTIME (Patient taking differently: Take 10 mg by mouth at bedtime. ) 30 tablet 3  . ondansetron (ZOFRAN) 4 MG tablet Take 1 tablet (4 mg total) by mouth every 8 (eight) hours as needed for nausea or vomiting. 30 tablet 1  . pantoprazole (PROTONIX) 40 MG tablet TAKE 1 TABLET BY MOUTH TWICE DAILY 30 MINUTES BEFORE MEALS 60 tablet 5  . potassium chloride SA (KLOR-CON) 20 MEQ tablet TAKE 1 TABLET(20 MEQ) BY MOUTH DAILY 30 tablet 5  . primidone (MYSOLINE) 50 MG tablet Take 1 tablet (50 mg total) by mouth in the morning and at bedtime. (Patient not taking: Reported on 03/27/2020) 60 tablet 2  . rosuvastatin (  CRESTOR) 5 MG tablet Take 1 tablet (5 mg total) by mouth daily. 90 tablet 3  . sodium chloride (OCEAN) 0.65 % SOLN nasal spray Place 1 spray into both nostrils as needed for congestion.    . tacrolimus (PROTOPIC) 0.1 % ointment Apply 1 application topically daily as needed (for irritation).     . triamcinolone cream (KENALOG) 0.1 % APPLY EXTERNALLY TO THE AFFECTED AREA TWICE DAILY (Patient taking differently: Apply 1 application topically 2 (two) times daily as needed (Rash). ) 45 g 0  . valsartan (DIOVAN) 320 MG tablet TAKE 1 TABLET BY MOUTH EVERY DAY 90 tablet 1  . vitamin C (ASCORBIC ACID) 250 MG tablet Take 500 mg by mouth daily.    . vitamin E 400 UNIT capsule Take 400 Units by mouth daily.    Marland Kitchen zinc gluconate 50 MG tablet Take 50 mg by mouth daily.     No current facility-administered medications for this visit.    Allergies as of 05/22/2020 - Review Complete 05/11/2020  Allergen Reaction Noted  . Ciprofloxacin Shortness Of Breath 12/27/2011  . Latex Other (See Comments) 10/17/2019    Family History  Problem Relation Age of Onset  . Diabetes Other   . Diabetes Mother   . Heart disease Mother   .  Heart disease Father   . Diabetes Sister   . Diabetes Brother   . Hypertension Brother   . Diabetes Daughter   . Hypertension Maternal Grandmother   . Stroke Maternal Grandfather   . Early death Paternal Grandfather   . Colon cancer Neg Hx   . Liver disease Neg Hx     Social History   Socioeconomic History  . Marital status: Single    Spouse name: Not on file  . Number of children: 3  . Years of education: Not on file  . Highest education level: Not on file  Occupational History  . Occupation: English as a second language teacher: [employer]  Tobacco Use  . Smoking status: Former Smoker    Packs/day: 0.25    Years: 35.00    Pack years: 8.75    Types: Cigarettes    Quit date: 12/31/2012    Years since quitting: 7.3  . Smokeless tobacco: Never Used  Vaping Use  . Vaping Use: Never used  Substance and Sexual Activity  . Alcohol use: Not Currently  . Drug use: No  . Sexual activity: Not Currently    Partners: Male    Birth control/protection: Post-menopausal, Surgical    Comment: tubal  Other Topics Concern  . Not on file  Social History Narrative   Eats all food groups.    Wears seatbelt.    Has 3 children.   Prior smoker.   Attends church.    Used to work in [employer] fund[employer].    Drives.   Enjoys going out with family.    Social Determinants of Health   Financial Resource Strain: Low Risk   . Difficulty of Paying Living Expenses: Not very hard  Food Insecurity: Not on file  Transportation Needs: Not on file  Physical Activity: Not on file  Stress: Not on file  Social Connections: Not on file    Review of Systems: Gen: Denies fever, chills, anorexia. Denies fatigue, weakness, weight loss.  CV: Denies chest pain, palpitations, syncope, peripheral edema, and claudication. Resp: Denies dyspnea at rest, cough, wheezing, coughing up blood, and pleurisy. GI: Denies vomiting blood, jaundice, and fecal incontinence.   Denies dysphagia or odynophagia. Derm: Denies rash,  itching, dry skin Psych: Denies depression, anxiety, memory loss, confusion. No homicidal or suicidal ideation.  Heme: Denies bruising, bleeding, and enlarged lymph nodes.  Physical Exam: There were no vitals taken for this visit. General:   Alert and oriented. No distress noted. Pleasant and cooperative.  Head:  Normocephalic and atraumatic. Eyes:  Conjuctiva clear without scleral icterus. Mouth:  Oral mucosa pink and moist. Good dentition. No lesions. Heart:  S1, S2 present without murmurs appreciated. Lungs:  Clear to auscultation bilaterally. No wheezes, rales, or rhonchi. No distress.  Abdomen:  +BS, soft, non-tender and non-distended. No rebound or guarding. No HSM or masses noted. Msk:  Symmetrical without gross deformities. Normal posture. Extremities:  Without edema. Neurologic:  Alert and  oriented x4 Psych:  Alert and cooperative. Normal mood and affect.

## 2020-05-22 ENCOUNTER — Ambulatory Visit: Payer: Medicare Other | Admitting: Gastroenterology

## 2020-05-28 ENCOUNTER — Other Ambulatory Visit: Payer: Self-pay | Admitting: Family Medicine

## 2020-05-29 ENCOUNTER — Other Ambulatory Visit: Payer: Self-pay | Admitting: Family Medicine

## 2020-05-29 DIAGNOSIS — I10 Essential (primary) hypertension: Secondary | ICD-10-CM

## 2020-06-02 ENCOUNTER — Telehealth: Payer: Self-pay | Admitting: Pharmacist

## 2020-06-02 DIAGNOSIS — J019 Acute sinusitis, unspecified: Secondary | ICD-10-CM | POA: Diagnosis not present

## 2020-06-02 DIAGNOSIS — J300 Vasomotor rhinitis: Secondary | ICD-10-CM | POA: Diagnosis not present

## 2020-06-02 DIAGNOSIS — J301 Allergic rhinitis due to pollen: Secondary | ICD-10-CM | POA: Diagnosis not present

## 2020-06-02 DIAGNOSIS — J310 Chronic rhinitis: Secondary | ICD-10-CM | POA: Diagnosis not present

## 2020-06-02 NOTE — Progress Notes (Addendum)
Chronic Care Management Pharmacy Assistant   Name: [name]  MRN: [id number] DOB: 1956-04-09  Reason for Encounter: Disease State for HTN.  Patient Questions:  1.  Have you seen any other providers since your last visit? Yes.    2.  Any changes in your medicines or health? No.    PCP : Susy Frizzle, MD   Their chronic conditions include: hypertension, GERD, Type II DM, Hyperlipidemia.  Office Visits: 03/27/20 Dr. Azell Der Doxazosin Mesylate 2 mg daily. STOPPED Clonidine.  02/28/20 Dr. Dennard Schaumann for tremors and knee pain. STARTED Diclofenac Sodium 75 mg 2 times daily and Primidone 50 mg 2 times daily. 01/28/20 Dr. Dennard Schaumann for left knee pain. Injection in knee. No medication changes. 12/31/19 Dr. Dennard Schaumann F/U on Blood pressure. STOPPED Amoxicillin and RESTARTED Hydrochlorothiazide 25 mg.  12/04/19 Dr. Dennard Schaumann for Ataxia. Images taken. STARTED Amoxicillin 875 mg 2 times daily.  10/29/19 Dr. Dennard Schaumann for fatigue. STOPPED Doxycycline and Pravastatin. DISCONTINUE Hydrochlorothiazide.  09/24/19 Dr. Dennard Schaumann for lyme disease. STARTED Doxycycline 100 mg 2 times daily.  Consults:  03/19/20 Vasc Surg Elam Dutch, MD. No changes. No medication changes. 03/04/20 Neurology Tat Eustace Quail, DO. Per note  STARTED primidone, 50 mg, 1/2 tablet at bed for a week and then you can increase to 1 tablet at bed for a week and as long as you tolerate the medication , you can increase to 1 tablet twice per day. STOPPED Multiple Vitaims-Mineral, Zinc and Hydrocodone-Acetaminophen 5-325 mg. 01/11/20 Otolaryngology Clyde Canterbury. Procedure for CT SCAN,MAXILLOFACIAL . No information given.  01/09/20 Arturo Morton PA-C procedure for sinusitis. No information given.  12/12/19 Vasc Surg Baglia Corrina, PA-C. F/U . No medication changes.  11/14/19 Maurene Capes, Tivis Ringer, PA-C S Dietary instructions given to patient.No medication changes.  10/18/19 Vasc Surg Ulyses Amor, PA-C.  STARTED thigh high compression, daily. No medication changes.  Hospital:  05/11/20 Quintella Reichert, MD. For a headache. No medication changes.  02/17/20 Fredia Sorrow, MD for back pain. Xray and CT images taken. STARTED Hydrocodone-Acetaminophen 5-325 mg. 10/16/19 Sherwood Gambler, MD. For leg swelling images taken. No medication changes   Allergies:   Allergies  Allergen Reactions   Ciprofloxacin Shortness Of Breath   Latex Other (See Comments)    Burns skin    Medications: Outpatient Encounter Medications as of 06/02/2020  Medication Sig   ALPRAZolam (XANAX) 0.5 MG tablet TAKE 1 TABLET(0.5 MG) BY MOUTH TWICE DAILY AS NEEDED FOR ANXIETY   acetaminophen (TYLENOL) 500 MG tablet Take 500 mg by mouth every 6 (six) hours as needed for moderate pain or headache.   amLODipine (NORVASC) 10 MG tablet TAKE 1 TABLET(10 MG) BY MOUTH AT BEDTIME   aspirin EC 81 MG tablet Take 81 mg by mouth at bedtime.    atorvastatin (LIPITOR) 80 MG tablet TAKE 1 TABLET BY MOUTH DAILY   cholecalciferol (VITAMIN D3) 25 MCG (1000 UT) tablet Take 2,000 Units by mouth daily.   Cyanocobalamin (B-12) 2500 MCG TABS Take 2,500 mcg by mouth daily.   diclofenac (VOLTAREN) 75 MG EC tablet TAKE 1 TABLET(75 MG) BY MOUTH TWICE DAILY   doxazosin (CARDURA) 2 MG tablet Take 1 tablet (2 mg total) by mouth daily. Stop clonidine   EPINEPHrine 0.3 mg/0.3 mL IJ SOAJ injection Inject 0.3 mg into the muscle as needed for anaphylaxis.    escitalopram (LEXAPRO) 10 MG tablet Take 1 tablet (10 mg total) by mouth daily.   fluticasone (FLONASE) 50 MCG/ACT nasal spray SHAKE LIQUID  AND USE 2 SPRAYS IN EACH NOSTRIL DAILY   hydrochlorothiazide (HYDRODIURIL) 25 MG tablet Take 1 tablet (25 mg total) by mouth daily.   hydrOXYzine (ATARAX/VISTARIL) 25 MG tablet Take 0.5-1 tablets (12.5-25 mg total) by mouth every 8 (eight) hours as needed for itching.   ipratropium (ATROVENT) 0.03 % nasal spray INSTILL 2 SPRAYS IN EACH NOSTRIL EVERY 12 HOURS    LINZESS 145 MCG CAPS capsule TAKE 1 CAPSULE(145 MCG) BY MOUTH DAILY BEFORE BREAKFAST   loratadine (CLARITIN) 10 MG tablet Take 10 mg by mouth daily as needed for allergies.   MAGNESIUM PO Take 330 mg by mouth daily.   metoprolol succinate (TOPROL-XL) 100 MG 24 hr tablet Take 1 tablet (100 mg total) by mouth daily.   mometasone (ELOCON) 0.1 % cream Apply 1 application topically daily as needed (for irritation).    montelukast (SINGULAIR) 10 MG tablet TAKE 1 TABLET(10 MG) BY MOUTH AT BEDTIME (Patient taking differently: Take 10 mg by mouth at bedtime. )   ondansetron (ZOFRAN) 4 MG tablet Take 1 tablet (4 mg total) by mouth every 8 (eight) hours as needed for nausea or vomiting.   pantoprazole (PROTONIX) 40 MG tablet TAKE 1 TABLET BY MOUTH TWICE DAILY 30 MINUTES BEFORE MEALS   potassium chloride SA (KLOR-CON) 20 MEQ tablet TAKE 1 TABLET(20 MEQ) BY MOUTH DAILY   primidone (MYSOLINE) 50 MG tablet TAKE 1 TABLET(50 MG) BY MOUTH IN THE MORNING AND AT BEDTIME   rosuvastatin (CRESTOR) 5 MG tablet Take 1 tablet (5 mg total) by mouth daily.   sodium chloride (OCEAN) 0.65 % SOLN nasal spray Place 1 spray into both nostrils as needed for congestion.   tacrolimus (PROTOPIC) 0.1 % ointment Apply 1 application topically daily as needed (for irritation).    triamcinolone cream (KENALOG) 0.1 % APPLY EXTERNALLY TO THE AFFECTED AREA TWICE DAILY (Patient taking differently: Apply 1 application topically 2 (two) times daily as needed (Rash). )   valsartan (DIOVAN) 320 MG tablet TAKE 1 TABLET BY MOUTH EVERY DAY   vitamin C (ASCORBIC ACID) 250 MG tablet Take 500 mg by mouth daily.   vitamin E 400 UNIT capsule Take 400 Units by mouth daily.   zinc gluconate 50 MG tablet Take 50 mg by mouth daily.   No facility-administered encounter medications on file as of 06/02/2020.    Current Diagnosis: Patient Active Problem List   Diagnosis Date Noted   Hyperlipidemia    Hypertension    Abdominal aortic aneurysm (AAA) 3.0 cm  to 5.0 cm in diameter in female [facility])    Nausea without vomiting 12/06/2018   Loss of weight 12/06/2018   Controlled diabetes mellitus type 2 with complications ([place]) 77/82/4235   Essential hypertension 04/25/2017   Hyperlipidemia LDL goal <70 04/25/2017   Carotid artery stenosis without cerebral infarction, bilateral 04/25/2017   AAA (abdominal aortic aneurysm) without rupture ([place]) 04/25/2017   History of colonic polyps    Diverticulosis of colon without hemorrhage    RUQ pain 02/21/2013   Abdominal pain, epigastric 01/05/2013   Abdominal bruit 01/05/2013   Esophageal dysphagia 01/05/2013   Constipation 01/05/2013   GERD (gastroesophageal reflux disease) 01/05/2013    Goals Addressed   None    Reviewed chart prior to disease state call. Spoke with patient regarding BP  Recent Office Vitals: BP Readings from Last 3 Encounters:  05/11/20 140/83  03/27/20 (!) 150/80  03/19/20 (!) 153/77   Pulse Readings from Last 3 Encounters:  05/11/20 (!) 56  03/27/20 66  03/19/20  67    Wt Readings from Last 3 Encounters:  03/27/20 172 lb (78 kg)  03/19/20 167 lb 9.6 oz (76 kg)  03/04/20 172 lb (78 kg)     Kidney Function Lab Results  Component Value Date/Time   CREATININE 0.86 05/11/2020 04:16 AM   CREATININE 0.73 02/17/2020 09:34 AM   CREATININE 0.87 10/29/2019 11:57 AM   CREATININE 0.81 07/31/2019 03:52 PM   GFRNONAA >60 05/11/2020 04:16 AM   GFRNONAA 70 10/29/2019 11:57 AM   GFRAA 81 10/29/2019 11:57 AM    BMP Latest Ref Rng & Units 05/11/2020 02/17/2020 10/29/2019  Glucose 70 - 99 mg/dL 110(H) 103(H) 96  BUN 8 - 23 mg/dL 10 9 8   Creatinine 0.44 - 1.00 mg/dL 0.86 0.73 0.87  BUN/Creat Ratio 6 - 22 (calc) - - NOT APPLICABLE  Sodium A999333 - 145 mmol/L 142 136 141  Potassium 3.5 - 5.1 mmol/L 3.9 3.8 4.2  Chloride 98 - 111 mmol/L 103 104 104  CO2 22 - 32 mmol/L 28 27 28   Calcium 8.9 - 10.3 mg/dL 9.7 9.3 9.7    Current antihypertensive regimen:  Valsartan 320mg  daily,  metoprolol succinate 100mg  daily HCTZ 25mg  daily clonidine 0.1mg  as needed, and amlodipine 10mg  daily  What recent interventions/DTPs have been made by any provider to improve Blood Pressure control since last CPP Visit: None.  Any recent hospitalizations or ED visits since last visit with CPP? Yes.   Adherence Review Is the patient currently on ACE/ARB medication? No.  Does the patient have >5 day gap between last estimated fill dates? No  Spoke with the patient and she stated she did not want to be apart of the CCM program. She asked for her february appointment to be canceled.   Follow-Up:  Pharmacist Review   Charlann Lange, RMA Clinical Pharmacist Assistant [phone]  5 minutes spent in review, coordination, and documentation.  Reviewed by: [name], PharmD Clinical Pharmacist [facility] [phone]

## 2020-06-09 ENCOUNTER — Telehealth: Payer: Self-pay | Admitting: Pharmacist

## 2020-06-09 NOTE — Progress Notes (Addendum)
    Chronic Care Management Pharmacy Assistant   Name: [name]  MRN: [id number] DOB: 02-14-1956  Reason for Encounter: Adherence Review  PCP : Susy Frizzle, MD   Verified Adherence Gap Information. Per insurance data, the patient is 90-99% compliant with the CHOL and HTN medication: Valsartan 320 mg and Rosuvastatin 5 mg. The patient has not met their annual wellness or wellness bundle screening. Patients breast screening was performed. Their most recent A1C was 5.8 on 09/14/19. The patient did not meet goal with keeping their blood pressure below 140/90.Their most recent blood pressure was 174/72 on 03/04/20. Total gaps - all measures is equal to 4.    Follow-Up:  Pharmacist Review   Charlann Lange, [place] Pharmacist Assistant [phone]  2 minutes spent in review, coordination, and documentation.  Reviewed by: [name], PharmD Clinical Pharmacist [facility] [phone]

## 2020-06-10 ENCOUNTER — Encounter: Payer: Self-pay | Admitting: Family Medicine

## 2020-06-10 ENCOUNTER — Other Ambulatory Visit: Payer: Self-pay

## 2020-06-10 ENCOUNTER — Ambulatory Visit (INDEPENDENT_AMBULATORY_CARE_PROVIDER_SITE_OTHER): Payer: Medicare Other | Admitting: Family Medicine

## 2020-06-10 VITALS — BP 140/76 | HR 65 | Temp 98.3°F | Ht 67.0 in | Wt 162.0 lb

## 2020-06-10 DIAGNOSIS — M2550 Pain in unspecified joint: Secondary | ICD-10-CM

## 2020-06-10 NOTE — Progress Notes (Signed)
Subjective:    Patient ID: [name], female    DOB: 08/17/1955, 65 y.o.   MRN: [id number]  Patient reports diffuse pain all over her body.  When I asked her when it starts she states that she has been hurting for 3 years!.  However it is really gotten worse over the last month.  She complains of almost 10 out of 10 pain in both hips as well as both shoulders.  She reports pain in both knees however her hips and shoulder seem to be the worst area.  She reports pain in her hands and in her knees and in her feet as well.  It seems like all the joints of her body are hurting simultaneously.  She is on Crestor 5 mg a day and she had muscle aches on previous statins.  She denies any fevers or chills.  She denies any rash on her face tickly on her cheeks.  She denies any photosensitive rash.  She denies any history of psoriasis.  She denies any history of rheumatoid arthritis or autoimmune diseases in her family. Past Medical History:  Diagnosis Date  . Abdominal aortic aneurysm (AAA) 3.0 cm to 5.0 cm in diameter in female [facility])    None seen on prior imaging?   Marland Kitchen Anxiety   . Cataract   . Chronic left shoulder pain   . Headache   . Hiatal hernia    small  . Hyperlipidemia   . Hypertension   . Pre-diabetes   . Schatzki's ring   . Seasonal allergies   . Tremor    Past Surgical History:  Procedure Laterality Date  . CARDIAC CATHETERIZATION  2005   "Ms. Hallmark has essentially normal coronary arteries and normal left ventricular function.  She will be treated empirically with anti-reflux measures"  . CATARACT EXTRACTION, BILATERAL    . CHOLECYSTECTOMY    . COLONOSCOPY    06/16/2004   ZPH:XTAVWP rectum/Diminutive polyps of the splenic flexure and the cecum/The remainder of the colonic mucosa appeared normal pathology (hyperplastic polyps).  . COLONOSCOPY N/A 08/07/2015   Dr. Gala Romney: 6 mm descending colon tubular adenoma, multiple medium-mouthed diverticula in sigmoid colon. surveillance  2022  . ESOPHAGOGASTRODUODENOSCOPY  09/11/2001   VXY:[id number] ring otherwise normal esophagus/Normal stomach/Normal D1 and D2 status post passage of a 56 [place] Maloney dilator  . ESOPHAGOGASTRODUODENOSCOPY N/A 11/11/2017   Procedure: ESOPHAGOGASTRODUODENOSCOPY (EGD);  Surgeon: Daneil Dolin, MD; mild Schatzki ring s/p dilation and mild erosive gastropathy.  . ESOPHAGOGASTRODUODENOSCOPY (EGD) WITH ESOPHAGEAL DILATION N/A 01/25/2013   Dr. Gala Romney- schatzki's ring- 14 F dilation. small hiatal hernia  . HYSTEROSCOPY WITH D & C N/A 03/06/2019   Procedure: DILATATION AND CURETTAGE /HYSTEROSCOPY;  Surgeon: Jonnie Kind, MD;  Location: [facility] ORS;  Service: Gynecology;  Laterality: N/A;  . [name] DILATION N/A 11/11/2017   Procedure: [name] DILATION;  Surgeon: Daneil Dolin, MD;  Location: [facility] ENDO SUITE;  Service: Endoscopy;  Laterality: N/A;  . POLYPECTOMY N/A 03/06/2019   Procedure: POLYPECTOMY ( REMOVAL OF ENDOMETRIAL POLYP);  Surgeon: Jonnie Kind, MD;  Location: [facility] ORS;  Service: Gynecology;  Laterality: N/A;  . TUBAL LIGATION     Current Outpatient Medications on File Prior to Visit  Medication Sig Dispense Refill  . acetaminophen (TYLENOL) 500 MG tablet Take 500 mg by mouth every 6 (six) hours as needed for moderate pain or headache.    . ALPRAZolam (XANAX) 0.5 MG tablet TAKE 1 TABLET(0.5 MG) BY MOUTH TWICE DAILY AS NEEDED  FOR ANXIETY 60 tablet 0  . amLODipine (NORVASC) 10 MG tablet TAKE 1 TABLET(10 MG) BY MOUTH AT BEDTIME 90 tablet 3  . amoxicillin-clavulanate (AUGMENTIN) 875-125 MG tablet Take 1 tablet by mouth 2 (two) times daily.    Marland Kitchen aspirin EC 81 MG tablet Take 81 mg by mouth at bedtime.     . cholecalciferol (VITAMIN D3) 25 MCG (1000 UT) tablet Take 2,000 Units by mouth daily.    . Cyanocobalamin (B-12) 2500 MCG TABS Take 2,500 mcg by mouth daily.    Marland Kitchen EPINEPHrine 0.3 mg/0.3 mL IJ SOAJ injection Inject 0.3 mg into the muscle as needed for anaphylaxis.     Marland Kitchen escitalopram (LEXAPRO)  10 MG tablet Take 1 tablet (10 mg total) by mouth daily. 30 tablet 5  . fluticasone (FLONASE) 50 MCG/ACT nasal spray SHAKE LIQUID AND USE 2 SPRAYS IN EACH NOSTRIL DAILY 16 g 6  . gentamicin ointment (GARAMYCIN) 0.1 % 3 (three) times daily.    . hydrochlorothiazide (HYDRODIURIL) 25 MG tablet Take 1 tablet (25 mg total) by mouth daily. 30 tablet 11  . hydrOXYzine (ATARAX/VISTARIL) 25 MG tablet Take 0.5-1 tablets (12.5-25 mg total) by mouth every 8 (eight) hours as needed for itching. 20 tablet 0  . ipratropium (ATROVENT) 0.03 % nasal spray INSTILL 2 SPRAYS IN EACH NOSTRIL EVERY 12 HOURS 30 mL 12  . levocetirizine (XYZAL) 5 MG tablet SMARTSIG:1 Tablet(s) By Mouth Every Evening    . LINZESS 145 MCG CAPS capsule TAKE 1 CAPSULE(145 MCG) BY MOUTH DAILY BEFORE BREAKFAST 30 capsule 3  . loratadine (CLARITIN) 10 MG tablet Take 10 mg by mouth daily as needed for allergies.    Marland Kitchen MAGNESIUM PO Take 330 mg by mouth daily.    . metoprolol succinate (TOPROL-XL) 100 MG 24 hr tablet Take 1 tablet (100 mg total) by mouth daily. 90 tablet 3  . mometasone (ELOCON) 0.1 % cream Apply 1 application topically daily as needed (for irritation).     . montelukast (SINGULAIR) 10 MG tablet TAKE 1 TABLET(10 MG) BY MOUTH AT BEDTIME (Patient taking differently: Take 10 mg by mouth at bedtime.) 30 tablet 3  . ondansetron (ZOFRAN) 4 MG tablet Take 1 tablet (4 mg total) by mouth every 8 (eight) hours as needed for nausea or vomiting. 30 tablet 1  . pantoprazole (PROTONIX) 40 MG tablet TAKE 1 TABLET BY MOUTH TWICE DAILY 30 MINUTES BEFORE MEALS 60 tablet 5  . potassium chloride SA (KLOR-CON) 20 MEQ tablet TAKE 1 TABLET(20 MEQ) BY MOUTH DAILY 30 tablet 5  . primidone (MYSOLINE) 50 MG tablet TAKE 1 TABLET(50 MG) BY MOUTH IN THE MORNING AND AT BEDTIME 60 tablet 2  . rosuvastatin (CRESTOR) 5 MG tablet Take 1 tablet (5 mg total) by mouth daily. 90 tablet 3  . sodium chloride (OCEAN) 0.65 % SOLN nasal spray Place 1 spray into both nostrils as  needed for congestion.    . tacrolimus (PROTOPIC) 0.1 % ointment Apply 1 application topically daily as needed (for irritation).     . triamcinolone cream (KENALOG) 0.1 % APPLY EXTERNALLY TO THE AFFECTED AREA TWICE DAILY (Patient taking differently: Apply 1 application topically 2 (two) times daily as needed (Rash).) 45 g 0  . valsartan (DIOVAN) 320 MG tablet TAKE 1 TABLET BY MOUTH EVERY DAY 90 tablet 1  . vitamin C (ASCORBIC ACID) 250 MG tablet Take 500 mg by mouth daily.    . vitamin E 400 UNIT capsule Take 400 Units by mouth daily.    Marland Kitchen  zinc gluconate 50 MG tablet Take 50 mg by mouth daily.    . diclofenac (VOLTAREN) 75 MG EC tablet TAKE 1 TABLET(75 MG) BY MOUTH TWICE DAILY (Patient not taking: Reported on 06/10/2020) 30 tablet 0  . doxazosin (CARDURA) 2 MG tablet Take 1 tablet (2 mg total) by mouth daily. Stop clonidine (Patient not taking: Reported on 06/10/2020) 30 tablet 3   No current facility-administered medications on file prior to visit.   Allergies  Allergen Reactions  . Ciprofloxacin Shortness Of Breath  . Latex Other (See Comments)    Burns skin   Social History   Socioeconomic History  . Marital status: Single    Spouse name: Not on file  . Number of children: 3  . Years of education: Not on file  . Highest education level: Not on file  Occupational History  . Occupation: English as a second language teacher: [employer]  Tobacco Use  . Smoking status: Former Smoker    Packs/day: 0.25    Years: 35.00    Pack years: 8.75    Types: Cigarettes    Quit date: 12/31/2012    Years since quitting: 7.4  . Smokeless tobacco: Never Used  Vaping Use  . Vaping Use: Never used  Substance and Sexual Activity  . Alcohol use: Not Currently  . Drug use: No  . Sexual activity: Not Currently    Partners: Male    Birth control/protection: Post-menopausal, Surgical    Comment: tubal  Other Topics Concern  . Not on file  Social History Narrative   Eats all food groups.    Wears  seatbelt.    Has 3 children.   Prior smoker.   Attends church.    Used to work in [employer] fundraiser.    Drives.   Enjoys going out with family.    Social Determinants of Health   Financial Resource Strain: Low Risk   . Difficulty of Paying Living Expenses: Not very hard  Food Insecurity: Not on file  Transportation Needs: Not on file  Physical Activity: Not on file  Stress: Not on file  Social Connections: Not on file  Intimate Partner Violence: Not on file   Family History  Problem Relation Age of Onset  . Diabetes Other   . Diabetes Mother   . Heart disease Mother   . Heart disease Father   . Diabetes Sister   . Diabetes Brother   . Hypertension Brother   . Diabetes Daughter   . Hypertension Maternal Grandmother   . Stroke Maternal Grandfather   . Early death Paternal Grandfather   . Colon cancer Neg Hx   . Liver disease Neg Hx       Review of Systems  All other systems reviewed and are negative.      Objective:   Physical Exam Vitals reviewed.  Constitutional:      Appearance: She is well-developed.  Eyes:     General: No visual field deficit.    Extraocular Movements:     Right eye: Normal extraocular motion and no nystagmus.     Left eye: Normal extraocular motion and no nystagmus.     Pupils:     Right eye: Pupil is round and reactive.     Left eye: Pupil is round and reactive.  Cardiovascular:     Rate and Rhythm: Normal rate and regular rhythm.     Heart sounds: Normal heart sounds. No murmur heard. No friction rub. No gallop.   Pulmonary:  Effort: Pulmonary effort is normal. No respiratory distress.     Breath sounds: Normal breath sounds. No stridor. No wheezing or rales.  Chest:     Chest wall: No tenderness.  Musculoskeletal:     Right shoulder: Bony tenderness present. Decreased range of motion. Decreased strength.     Left shoulder: Bony tenderness present. Decreased range of motion. Decreased strength.     Right hand: No swelling,  deformity, tenderness or bony tenderness.     Left hand: Normal. No swelling, deformity, tenderness or bony tenderness. Normal range of motion.     Cervical back: Normal range of motion. No rigidity.     Right hip: Tenderness present. Decreased range of motion.     Left hip: Tenderness present. Decreased range of motion.     Left knee: Decreased range of motion. Tenderness present over the medial joint line and lateral joint line.  Neurological:     Mental Status: She is alert and oriented to person, place, and time. Mental status is at baseline.     Cranial Nerves: No cranial nerve deficit, dysarthria or facial asymmetry.     Sensory: No sensory deficit.     Motor: Tremor present. No weakness or abnormal muscle tone.     Coordination: Romberg sign negative. Coordination normal.     Gait: Gait normal.           Assessment & Plan:  Polyarthralgia - Plan: CBC with Differential/Platelet, COMPLETE METABOLIC PANEL WITH GFR, Sedimentation rate, CK, ANA, Rheumatoid factor  Given the diffuse nature of her pain, I am concerned that she may be developing an autoimmune disease.  Begin by checking a sedimentation rate.  If elevated PMR is high on the differential diagnosis.  Check rheumatoid factor, ANA, check a CK for myositis.  Temporarily discontinue Crestor to see if this could be statin induced myopathy.  Check CBC.  Leukocytosis or other bone marrow abnormalities and check CMP to evaluate alkaline phosphatase for any evidence of increased bone turnover or elevated albumin/protein gap to suggest multiple myeloma.  Await the results of her lab work

## 2020-06-11 DIAGNOSIS — M25562 Pain in left knee: Secondary | ICD-10-CM | POA: Diagnosis not present

## 2020-06-11 DIAGNOSIS — M25462 Effusion, left knee: Secondary | ICD-10-CM | POA: Diagnosis not present

## 2020-06-11 LAB — CBC WITH DIFFERENTIAL/PLATELET
Absolute Monocytes: 474 cells/uL (ref 200–950)
Basophils Absolute: 30 cells/uL (ref 0–200)
Basophils Relative: 0.4 %
Eosinophils Absolute: 289 cells/uL (ref 15–500)
Eosinophils Relative: 3.9 %
HCT: 36.7 % (ref 35.0–45.0)
Hemoglobin: 12.4 g/dL (ref 11.7–15.5)
Lymphs Abs: 4492 cells/uL — ABNORMAL HIGH (ref 850–3900)
MCH: 30 pg (ref 27.0–33.0)
MCHC: 33.8 g/dL (ref 32.0–36.0)
MCV: 88.6 fL (ref 80.0–100.0)
MPV: 10.1 fL (ref 7.5–12.5)
Monocytes Relative: 6.4 %
Neutro Abs: 2116 cells/uL (ref 1500–7800)
Neutrophils Relative %: 28.6 %
Platelets: 251 10*3/uL (ref 140–400)
RBC: 4.14 10*6/uL (ref 3.80–5.10)
RDW: 11.9 % (ref 11.0–15.0)
Total Lymphocyte: 60.7 %
WBC: 7.4 10*3/uL (ref 3.8–10.8)

## 2020-06-11 LAB — COMPLETE METABOLIC PANEL WITH GFR
AG Ratio: 1.7 (calc) (ref 1.0–2.5)
ALT: 13 U/L (ref 6–29)
AST: 18 U/L (ref 10–35)
Albumin: 4.3 g/dL (ref 3.6–5.1)
Alkaline phosphatase (APISO): 76 U/L (ref 37–153)
BUN: 9 mg/dL (ref 7–25)
CO2: 29 mmol/L (ref 20–32)
Calcium: 9.7 mg/dL (ref 8.6–10.4)
Chloride: 100 mmol/L (ref 98–110)
Creat: 0.93 mg/dL (ref 0.50–0.99)
GFR, Est African American: 74 mL/min/{1.73_m2} (ref >=60)
GFR, Est Non African American: 64 mL/min/{1.73_m2} (ref >=60)
Globulin: 2.6 g/dL (calc) (ref 1.9–3.7)
Glucose, Bld: 72 mg/dL (ref 65–99)
Potassium: 3.7 mmol/L (ref 3.5–5.3)
Sodium: 138 mmol/L (ref 135–146)
Total Bilirubin: 0.7 mg/dL (ref 0.2–1.2)
Total Protein: 6.9 g/dL (ref 6.1–8.1)

## 2020-06-11 LAB — SEDIMENTATION RATE: Sed Rate: 14 mm/h (ref 0–30)

## 2020-06-11 LAB — ANA: Anti Nuclear Antibody (ANA): NEGATIVE

## 2020-06-11 LAB — RHEUMATOID FACTOR: Rheumatoid fact SerPl-aCnc: <14 IU/mL (ref <14)

## 2020-06-11 LAB — CK: Total CK: 78 U/L (ref 29–143)

## 2020-06-12 NOTE — Progress Notes (Signed)
CARDIOLOGY CONSULT NOTE       Patient ID: [name] [name] DOB/AGE: 1956-02-14 65 y.o.  Admit date: (Not on file) Referring Physician: Pickard Primary Physician: Kimberly Frizzle, MD Primary Cardiologist: [name]  Reason for Consultation: HTN, elevated troponin     HPI:  65 y.o. with labile HTN, HLD, history of esophageal web with dilatation and GERD Seen in [facility] ER October 2020  For hypertensive urgency BP 962 systolic. Dr [name] had started her on clonidine patch and adjusted her ARB Complained of fluttering in chest for 4 days Compliant with meds Labs ok except K 3.3 Troponin 18->19 no trend Not sure why checked as she had no chest pain. CXR NAD CT head No acute findings Recommended admission But left AMA ECG non acute with LAE non LVH no acute ST changes 02/02/19 Previous smoker quit in 2014  Myovue normal 03/2020   She follows with Dr [name] [facility] She has left bruit with duplex July  showing 40-59% bilateral ICA stenosis stable Carries Diagnosis of AAA but duplex 11/22/18 showed largest diameter only 2.6 cm Mild claudication with ABI's 12/12/19 O.83 on right and 0.87 on left   She has no documented history of CAD  Had normal cath 2014 with pain ascribed to GI cause BP seems sub optimally controled She is on statin for HLD  Seen in ED 05/11/20 with transient increase in BP chronic headache and numbness in face Head CT negative Chronic abdominal pain CT abdomen only diverticulosis 02/17/20   3 children. [name] is a traveling nurse  She has some acute/chronic left knee pain. Has brace on Seeing orthopedics and had MRI earlier  This week but does not know results yet      ROS All other systems reviewed and negative except as noted above  Past Medical History:  Diagnosis Date  . Abdominal aortic aneurysm (AAA) 3.0 cm to 5.0 cm in diameter in female [facility])    None seen on prior imaging?   Marland Kitchen Anxiety   . Cataract   . Chronic left shoulder pain   . Headache    . Hiatal hernia    small  . Hyperlipidemia   . Hypertension   . Pre-diabetes   . Schatzki's ring   . Seasonal allergies   . Tremor     Family History  Problem Relation Age of Onset  . Diabetes Other   . Diabetes Mother   . Heart disease Mother   . Heart disease Father   . Diabetes Sister   . Diabetes Brother   . Hypertension Brother   . Diabetes Daughter   . Hypertension Maternal Grandmother   . Stroke Maternal Grandfather   . Early death Paternal Grandfather   . Colon cancer Neg Hx   . Liver disease Neg Hx     Social History   Socioeconomic History  . Marital status: Single    Spouse name: Not on file  . Number of children: 3  . Years of education: Not on file  . Highest education level: Not on file  Occupational History  . Occupation: English as a second language teacher: [employer]  Tobacco Use  . Smoking status: Former Smoker    Packs/day: 0.25    Years: 35.00    Pack years: 8.75    Types: Cigarettes    Quit date: 12/31/2012    Years since quitting: 7.4  . Smokeless tobacco: Never Used  Vaping Use  . Vaping Use: Never used  Substance and  Sexual Activity  . Alcohol use: Not Currently  . Drug use: No  . Sexual activity: Not Currently    Partners: Male    Birth control/protection: Post-menopausal, Surgical    Comment: tubal  Other Topics Concern  . Not on file  Social History Narrative   Eats all food groups.    Wears seatbelt.    Has 3 children.   Prior smoker.   Attends church.    Used to work in [employer] fundraiser.    Drives.   Enjoys going out with family.    Social Determinants of Health   Financial Resource Strain: Low Risk   . Difficulty of Paying Living Expenses: Not very hard  Food Insecurity: Not on file  Transportation Needs: Not on file  Physical Activity: Not on file  Stress: Not on file  Social Connections: Not on file  Intimate Partner Violence: Not on file    Past Surgical History:  Procedure Laterality Date  . CARDIAC CATHETERIZATION   2005   "[name] has essentially normal coronary arteries and normal left ventricular function.  She will be treated empirically with anti-reflux measures"  . CATARACT EXTRACTION, BILATERAL    . CHOLECYSTECTOMY    . COLONOSCOPY    06/16/2004   [name] rectum/Diminutive polyps of the splenic flexure and the cecum/The remainder of the colonic mucosa appeared normal pathology (hyperplastic polyps).  . COLONOSCOPY N/A 08/07/2015   Dr. Gala Williams: 6 mm descending colon tubular adenoma, multiple medium-mouthed diverticula in sigmoid colon. surveillance 2022  . ESOPHAGOGASTRODUODENOSCOPY  09/11/2001   [name] ring otherwise normal esophagus/Normal stomach/Normal D1 and D2 status post passage of a 56 [place] Maloney dilator  . ESOPHAGOGASTRODUODENOSCOPY N/A 11/11/2017   Procedure: ESOPHAGOGASTRODUODENOSCOPY (EGD);  Surgeon: Kimberly Dolin, MD; mild Schatzki ring s/p dilation and mild erosive gastropathy.  . ESOPHAGOGASTRODUODENOSCOPY (EGD) WITH ESOPHAGEAL DILATION N/A 01/25/2013   Dr. Gala Williams- schatzki's ring- 59 F dilation. small hiatal hernia  . HYSTEROSCOPY WITH D & C N/A 03/06/2019   Procedure: DILATATION AND CURETTAGE /HYSTEROSCOPY;  Surgeon: Kimberly Kind, MD;  Location: [facility];  Service: Gynecology;  Laterality: N/A;  . [name] DILATION N/A 11/11/2017   Procedure: [name] DILATION;  Surgeon: Kimberly Dolin, MD;  Location: [facility] SU[facility];  Service: Endoscopy;  Laterality: N/A;  . POLYPECTOMY N/A 03/06/2019   Procedure: POLYPECTOMY ( REMOVAL OF ENDOMETRIAL POLYP);  Surgeon: Kimberly Kind, MD;  Location: [facility] ORS;  Service: Gynecology;  Laterality: N/A;  . TUBAL LIGATION          Physical Exam: Blood pressure 132/66, pulse 66, height 5\' 7"  (1.702 m), weight 81.2 kg, SpO2 97 %.    Affect appropriate Healthy:  appears stated age 63: normal Neck supple with no adenopathy JVP normal bilateral  bruits no thyromegaly Lungs clear with no wheezing and good diaphragmatic motion Heart:   S1/S2 short 2/6 SEM murmur, no rub, gallop or click PMI normal Abdomen: benighn, BS positve, no tenderness, no AAA no bruit.  No HSM or HJR Distal pulses intact with no bruits No edema Neuro non-focal Skin warm and dry Soft brace on left knee    Labs:   Lab Results  Component Value Date   WBC 7.4 06/10/2020   HGB 12.4 06/10/2020   HCT 36.7 06/10/2020   MCV 88.6 06/10/2020   PLT 251 06/10/2020    No results for input(s): NA, K, CL, CO2, BUN, CREATININE, CALCIUM, PROT, BILITOT, ALKPHOS, ALT, AST, GLUCOSE in the last 168 hours.  Invalid input(s): LABALBU Lab Results  Component Value Date   CKTOTAL 78 06/10/2020   TROPONINI <0.03 12/28/2017    Lab Results  Component Value Date   CHOL 157 06/27/2018   CHOL 181 12/09/2017   CHOL 145 07/18/2017   Lab Results  Component Value Date   HDL 66 06/27/2018   HDL 51 12/09/2017   HDL 50 (L) 07/18/2017   Lab Results  Component Value Date   LDLCALC 77 06/27/2018   LDLCALC 111 (H) 12/09/2017   LDLCALC 79 07/18/2017   Lab Results  Component Value Date   TRIG 49 06/27/2018   TRIG 89 12/09/2017   TRIG 75 07/18/2017   Lab Results  Component Value Date   CHOLHDL 2.4 06/27/2018   CHOLHDL 3.5 12/09/2017   CHOLHDL 2.9 07/18/2017   No results found for: LDLDIRECT    Radiology: No results found.  EKG: See HPI   ASSESSMENT AND PLAN:   1. Elevated Troponin:  In absence of chest pain. Multiple risk factors and known carotid vascular disease Normal myovue 03/28/19  2. HTN:  Labile f/u Dr [name] currently on Diovan 320 mg Toprol 100 mg  Hydrodiuril 25 mg Cardura 2 mg and norvasc 10 mg  3.  HLD:  On statin target LDL 70 or less with carotid disease  4. Bruit:  F/U [facility] Dr [name] 12-87% R/LICA stable duplex again July 2021 ASA  5. GI: history of GERD and esophageal dilatation seen by GI recently and repeat EGD deferred Continue Linzess and protonix  6. PVD:  Mild decrease in ABI's 12/12/19 no limiting claudication f/u Dr [name] 7.  Ortho: left knee swelling awaiting MRI results likely will need scope   F/U in a year   Signed: Jenkins Rouge 06/20/2020, 2:19 PM

## 2020-06-16 ENCOUNTER — Telehealth: Payer: Self-pay

## 2020-06-16 DIAGNOSIS — M25561 Pain in right knee: Secondary | ICD-10-CM | POA: Diagnosis not present

## 2020-06-20 ENCOUNTER — Encounter: Payer: Self-pay | Admitting: Cardiovascular Disease

## 2020-06-20 ENCOUNTER — Ambulatory Visit: Payer: Medicare Other | Admitting: Cardiovascular Disease

## 2020-06-20 ENCOUNTER — Other Ambulatory Visit: Payer: Self-pay

## 2020-06-20 VITALS — BP 132/66 | HR 66 | Ht 67.0 in | Wt 179.0 lb

## 2020-06-20 DIAGNOSIS — E782 Mixed hyperlipidemia: Secondary | ICD-10-CM | POA: Diagnosis not present

## 2020-06-20 DIAGNOSIS — I10 Essential (primary) hypertension: Secondary | ICD-10-CM | POA: Diagnosis not present

## 2020-06-20 DIAGNOSIS — I739 Peripheral vascular disease, unspecified: Secondary | ICD-10-CM | POA: Diagnosis not present

## 2020-06-20 NOTE — Patient Instructions (Signed)
Medication Instructions:  Your physician recommends that you continue on your current medications as directed. Please refer to the Current Medication list given to you today.  *If you need a refill on your cardiac medications before your next appointment, please call your pharmacy*   Lab Work: NONE   If you have labs (blood work) drawn today and your tests are completely normal, you will receive your results only by: . MyChart Message (if you have MyChart) OR . A paper copy in the mail If you have any lab test that is abnormal or we need to change your treatment, we will call you to review the results.   Testing/Procedures: NONE    Follow-Up: At [facility], you and your health needs are our priority.  As part of our continuing mission to provide you with exceptional heart care, we have created designated Provider Care Teams.  These Care Teams include your primary Cardiologist (physician) and Advanced Practice Providers (APPs -  Physician Assistants and Nurse Practitioners) who all work together to provide you with the care you need, when you need it.  We recommend signing up for the patient portal called "MyChart".  Sign up information is provided on this After Visit Summary.  MyChart is used to connect with patients for Virtual Visits (Telemedicine).  Patients are able to view lab/test results, encounter notes, upcoming appointments, etc.  Non-urgent messages can be sent to your provider as well.   To learn more about what you can do with MyChart, go to [email or web address].    Your next appointment:   1 year(s)  The format for your next appointment:   In Person  Provider:   Peter Nishan, MD   Other Instructions Thank you for choosing [facility]!    

## 2020-06-26 ENCOUNTER — Encounter: Payer: Self-pay | Admitting: Neurology

## 2020-06-26 ENCOUNTER — Ambulatory Visit: Payer: Self-pay | Admitting: Neurology

## 2020-06-26 ENCOUNTER — Other Ambulatory Visit: Payer: Self-pay

## 2020-06-26 VITALS — BP 142/94 | HR 66 | Ht 67.0 in | Wt 178.0 lb

## 2020-06-26 DIAGNOSIS — G250 Essential tremor: Secondary | ICD-10-CM

## 2020-06-26 DIAGNOSIS — I10 Essential (primary) hypertension: Secondary | ICD-10-CM

## 2020-06-26 DIAGNOSIS — R519 Headache, unspecified: Secondary | ICD-10-CM

## 2020-06-26 NOTE — Patient Instructions (Signed)
Good to see you today!  Follow up with your PCP or cardiology regarding your blood pressure.

## 2020-06-26 NOTE — Progress Notes (Signed)
Assessment/Plan:    1.  Essential Tremor  -Patient did not tolerate primidone. Already on metoprolol (and a host of other antihypertensives). At this point in time, tremor not that bothersome to patient and I really would not recommend other medicines given risk: Benefit ratio. Patient agreed.  2.  HTN  -Being managed by cardiology and primary care.  On multiple antihypertensives.  Nocturnal headache felt to be a result of this. She was a little frustrated about this and asked several questions. She wanted me to manage headache, but I feel strongly that morning headache is a result of her blood pressure. She admits she is waking up with systolics in the 867Y. We talked about raising the head of the bed and sleeping on a wedge pillow, but ultimately she needs blood pressure control, which is far out of my area of expertise. Discussed that while our office does primary headache management, we do not do secondary headache management (headache caused by a blood pressure issue). The treatment of this is controlling the blood pressure. She states that the emergency room told her to follow-up with me. I apologized for this, but did tell her that based on primary care notes, I feel that he certainly recognize that her morning headache was a result of her blood pressure and has been working hard to try to control it.  3.  Diffuse pain  -Primary care working up for autoimmune etiology.  Nothing focal or lateralizing on examination to suggest neurologic etiology.  Subjective:   [name] was seen today in follow up for essential tremor.  My previous records were reviewed prior to todays visit.  Primidone started last visit.  She called me several weeks later and stated that she was having bad dreams and I told her to take both tablets in the morning instead of twice daily dosing.  She reports today that it was making her "sick".  When asked what that meant, she states that it was making her tired.   States that tremor isn't particularly bothering her and may be a bit better..  When she called, she was also reporting high blood pressure, which would not be related to tremor or her primidone and I told her she needed to follow-up with her primary care, which she did.  I reviewed those records.  He stopped her clonidine.  He added Cardura, in addition to her metoprolol, hydrochlorothiazide, valsartan and amlodipine.  He felt that her headache was due to nocturnal high blood pressure.  He also was adding Xanax for sleep.  She presented to the emergency department in January complaining about high blood pressure and bad dreams and continued nocturnal headache.  Her nocturnal blood pressure continued to be high.  She followed back up with her primary care February 1.  Patient reported diffuse pain all over the body.  Her primary care started to work her up for autoimmune etiologies.  He also stopped her Crestor.    States that biggest reason she came today is AM headache.  Her BP goes up and her AM SBP is in the 100's. She feels that the vivid dreams are waking her up and it takes her half the morning to get her blood pressure back down.  Current prescribed movement disorder medications: Primidone, 50 mg, 2 tablets in the morning (started last visit) -patient not taking.    ALLERGIES:   Allergies  Allergen Reactions  . Ciprofloxacin Shortness Of Breath  . Latex Other (See Comments)  Burns skin    CURRENT MEDICATIONS:  Outpatient Encounter Medications as of 06/26/2020  Medication Sig  . acetaminophen (TYLENOL) 500 MG tablet Take 500 mg by mouth every 6 (six) hours as needed for moderate pain or headache.  . ALPRAZolam (XANAX) 0.5 MG tablet TAKE 1 TABLET(0.5 MG) BY MOUTH TWICE DAILY AS NEEDED FOR ANXIETY  . amLODipine (NORVASC) 10 MG tablet TAKE 1 TABLET(10 MG) BY MOUTH AT BEDTIME  . aspirin EC 81 MG tablet Take 81 mg by mouth at bedtime.   . cholecalciferol (VITAMIN D3) 25 MCG (1000 UT) tablet  Take 2,000 Units by mouth daily.  . Cyanocobalamin (B-12) 2500 MCG TABS Take 2,500 mcg by mouth daily.  Marland Kitchen EPINEPHrine 0.3 mg/0.3 mL IJ SOAJ injection Inject 0.3 mg into the muscle as needed for anaphylaxis.   Marland Kitchen escitalopram (LEXAPRO) 10 MG tablet Take 1 tablet (10 mg total) by mouth daily.  . fluticasone (FLONASE) 50 MCG/ACT nasal spray SHAKE LIQUID AND USE 2 SPRAYS IN EACH NOSTRIL DAILY  . gentamicin ointment (GARAMYCIN) 0.1 % 3 (three) times daily.  . hydrochlorothiazide (HYDRODIURIL) 25 MG tablet Take 1 tablet (25 mg total) by mouth daily.  . hydrOXYzine (ATARAX/VISTARIL) 25 MG tablet Take 0.5-1 tablets (12.5-25 mg total) by mouth every 8 (eight) hours as needed for itching.  Marland Kitchen ipratropium (ATROVENT) 0.03 % nasal spray INSTILL 2 SPRAYS IN EACH NOSTRIL EVERY 12 HOURS  . levocetirizine (XYZAL) 5 MG tablet SMARTSIG:1 Tablet(s) By Mouth Every Evening  . LINZESS 145 MCG CAPS capsule TAKE 1 CAPSULE(145 MCG) BY MOUTH DAILY BEFORE BREAKFAST  . loratadine (CLARITIN) 10 MG tablet Take 10 mg by mouth daily as needed for allergies.  Marland Kitchen MAGNESIUM PO Take 330 mg by mouth daily.  . metoprolol succinate (TOPROL-XL) 100 MG 24 hr tablet Take 1 tablet (100 mg total) by mouth daily.  . mometasone (ELOCON) 0.1 % cream Apply 1 application topically daily as needed (for irritation).   . montelukast (SINGULAIR) 10 MG tablet TAKE 1 TABLET(10 MG) BY MOUTH AT BEDTIME (Patient taking differently: Take 10 mg by mouth at bedtime.)  . ondansetron (ZOFRAN) 4 MG tablet Take 1 tablet (4 mg total) by mouth every 8 (eight) hours as needed for nausea or vomiting.  . pantoprazole (PROTONIX) 40 MG tablet TAKE 1 TABLET BY MOUTH TWICE DAILY 30 MINUTES BEFORE MEALS  . potassium chloride SA (KLOR-CON) 20 MEQ tablet TAKE 1 TABLET(20 MEQ) BY MOUTH DAILY  . primidone (MYSOLINE) 50 MG tablet TAKE 1 TABLET(50 MG) BY MOUTH IN THE MORNING AND AT BEDTIME  . rosuvastatin (CRESTOR) 5 MG tablet Take 1 tablet (5 mg total) by mouth daily.  . sodium  chloride (OCEAN) 0.65 % SOLN nasal spray Place 1 spray into both nostrils as needed for congestion.  . tacrolimus (PROTOPIC) 0.1 % ointment Apply 1 application topically daily as needed (for irritation).   . triamcinolone cream (KENALOG) 0.1 % APPLY EXTERNALLY TO THE AFFECTED AREA TWICE DAILY (Patient taking differently: Apply 1 application topically 2 (two) times daily as needed (Rash).)  . valsartan (DIOVAN) 320 MG tablet TAKE 1 TABLET BY MOUTH EVERY DAY  . vitamin C (ASCORBIC ACID) 250 MG tablet Take 500 mg by mouth daily.  . vitamin E 400 UNIT capsule Take 400 Units by mouth daily.  Marland Kitchen zinc gluconate 50 MG tablet Take 50 mg by mouth daily.  . [DISCONTINUED] amoxicillin-clavulanate (AUGMENTIN) 875-125 MG tablet Take 1 tablet by mouth 2 (two) times daily.   No facility-administered encounter medications on file as of  06/26/2020.     Objective:    PHYSICAL EXAMINATION:    VITALS:   Vitals:   06/26/20 1032  BP: (!) 142/94  Pulse: 66  SpO2: 98%  Weight: 178 lb (80.7 kg)  Height: 5\' 7"  (1.702 m)    GEN:  The patient appears stated age and is in NAD. HEENT:  Normocephalic, atraumatic.  The mucous membranes are moist. The superficial temporal arteries are without ropiness or tenderness. CV:  RRR Lungs:  CTAB Neck/HEME:  There are no carotid bruits bilaterally.  Neurological examination:  Orientation: The patient is alert and oriented x3. Cranial nerves: There is good facial symmetry. The speech is fluent and clear. Soft palate rises symmetrically and there is no tongue deviation. Hearing is intact to conversational tone. Sensation: Sensation is intact to light touch throughout Motor: Strength is at least antigravity x4.  Movement examination: Tone: There is normal tone in the UE/LE Abnormal movements: There is no rest tremor. There is mild postural tremor. There is mild to moderate intention tremor on the right greater than left. Coordination:  There is no decremation with  RAM's  I have reviewed and interpreted the following labs independently   Chemistry      Component Value Date/Time   NA 138 06/10/2020 1541   NA 141 11/16/2012 0000   K 3.7 06/10/2020 1541   K 4.1 11/16/2012 0000   CL 100 06/10/2020 1541   CO2 29 06/10/2020 1541   BUN 9 06/10/2020 1541   BUN 11 11/16/2012 0000   CREATININE 0.93 06/10/2020 1541      Component Value Date/Time   CALCIUM 9.7 06/10/2020 1541   CALCIUM 9.7 11/16/2012 0000   ALKPHOS 87 03/02/2019 1257   ALKPHOS 77 11/16/2012 0000   AST 18 06/10/2020 1541   AST 18 11/16/2012 0000   ALT 13 06/10/2020 1541   BILITOT 0.7 06/10/2020 1541   BILITOT 0.8 11/16/2012 0000      Lab Results  Component Value Date   WBC 7.4 06/10/2020   HGB 12.4 06/10/2020   HCT 36.7 06/10/2020   MCV 88.6 06/10/2020   PLT 251 06/10/2020   Lab Results  Component Value Date   TSH 1.84 10/29/2019     Chemistry      Component Value Date/Time   NA 138 06/10/2020 1541   NA 141 11/16/2012 0000   K 3.7 06/10/2020 1541   K 4.1 11/16/2012 0000   CL 100 06/10/2020 1541   CO2 29 06/10/2020 1541   BUN 9 06/10/2020 1541   BUN 11 11/16/2012 0000   CREATININE 0.93 06/10/2020 1541      Component Value Date/Time   CALCIUM 9.7 06/10/2020 1541   CALCIUM 9.7 11/16/2012 0000   ALKPHOS 87 03/02/2019 1257   ALKPHOS 77 11/16/2012 0000   AST 18 06/10/2020 1541   AST 18 11/16/2012 0000   ALT 13 06/10/2020 1541   BILITOT 0.7 06/10/2020 1541   BILITOT 0.8 11/16/2012 0000      Lab Results  Component Value Date   ESRSEDRATE 14 06/10/2020   Lab Results  Component Value Date   CKTOTAL 78 06/10/2020   TROPONINI <0.03 12/28/2017      Total time spent on today's visit was 31 minutes, including both face-to-face time and nonface-to-face time.  Time included that spent on review of records (prior notes available to me/labs/imaging if pertinent), discussing treatment and goals, answering patient's questions and coordinating care.  Cc:  Susy Frizzle, MD

## 2020-06-30 ENCOUNTER — Encounter: Payer: Self-pay | Admitting: Family Medicine

## 2020-06-30 ENCOUNTER — Other Ambulatory Visit: Payer: Self-pay

## 2020-06-30 ENCOUNTER — Ambulatory Visit (INDEPENDENT_AMBULATORY_CARE_PROVIDER_SITE_OTHER): Payer: Medicare Other | Admitting: Family Medicine

## 2020-06-30 VITALS — BP 132/76 | HR 67 | Temp 97.7°F | Ht 67.0 in | Wt 173.0 lb

## 2020-06-30 DIAGNOSIS — T466X5A Adverse effect of antihyperlipidemic and antiarteriosclerotic drugs, initial encounter: Secondary | ICD-10-CM

## 2020-06-30 DIAGNOSIS — G720 Drug-induced myopathy: Secondary | ICD-10-CM | POA: Diagnosis not present

## 2020-06-30 MED ORDER — EZETIMIBE 10 MG PO TABS: 10.0000 mg | ORAL_TABLET | Freq: Every day | ORAL | 3 refills | Status: DC

## 2020-06-30 NOTE — Progress Notes (Signed)
Subjective:    Patient ID: [name], female    DOB: 03/23/1956, 65 y.o.   MRN: [id number]  06/10/20 Patient reports diffuse pain all over her body.  When I asked her when it starts she states that she has been hurting for 3 years!.  However it is really gotten worse over the last month.  She complains of almost 10 out of 10 pain in both hips as well as both shoulders.  She reports pain in both knees however her hips and shoulder seem to be the worst area.  She reports pain in her hands and in her knees and in her feet as well.  It seems like all the joints of her body are hurting simultaneously.  She is on Crestor 5 mg a day and she had muscle aches on previous statins.  She denies any fevers or chills.  She denies any rash on her face tickly on her cheeks.  She denies any photosensitive rash.  She denies any history of psoriasis.  She denies any history of rheumatoid arthritis or autoimmune diseases in her family.  At that time, my plan was: Given the diffuse nature of her pain, I am concerned that she may be developing an autoimmune disease.  Begin by checking a sedimentation rate.  If elevated PMR is high on the differential diagnosis.  Check rheumatoid factor, ANA, check a CK for myositis.  Temporarily discontinue Crestor to see if this could be statin induced myopathy.  Check CBC.  Leukocytosis or other bone marrow abnormalities and check CMP to evaluate alkaline phosphatase for any evidence of increased bone turnover or elevated albumin/protein gap to suggest multiple myeloma.  Await the results of her lab work  06/30/20 Office Visit on 06/10/2020  Component Date Value Ref Range Status  . WBC 06/10/2020 7.4  3.8 - 10.8 Thousand/uL Final  . RBC 06/10/2020 4.14  3.80 - 5.10 Million/uL Final  . Hemoglobin 06/10/2020 12.4  11.7 - 15.5 g/dL Final  . HCT 06/10/2020 36.7  35.0 - 45.0 % Final  . MCV 06/10/2020 88.6  80.0 - 100.0 fL Final  . MCH 06/10/2020 30.0  27.0 - 33.0 pg Final  . MCHC  06/10/2020 33.8  32.0 - 36.0 g/dL Final  . RDW 06/10/2020 11.9  11.0 - 15.0 % Final  . Platelets 06/10/2020 251  140 - 400 Thousand/uL Final  . MPV 06/10/2020 10.1  7.5 - 12.5 fL Final  . Neutro Abs 06/10/2020 2,116  1,500 - 7,800 cells/uL Final  . Lymphs Abs 06/10/2020 4,492* 850 - 3,900 cells/uL Final  . Absolute Monocytes 06/10/2020 474  200 - 950 cells/uL Final  . Eosinophils Absolute 06/10/2020 289  15 - 500 cells/uL Final  . Basophils Absolute 06/10/2020 30  0 - 200 cells/uL Final  . Neutrophils Relative % 06/10/2020 28.6  % Final  . Total Lymphocyte 06/10/2020 60.7  % Final  . Monocytes Relative 06/10/2020 6.4  % Final  . Eosinophils Relative 06/10/2020 3.9  % Final  . Basophils Relative 06/10/2020 0.4  % Final  . Glucose, Bld 06/10/2020 72  65 - 99 mg/dL Final   Comment: .            Fasting reference interval .   . BUN 06/10/2020 9  7 - 25 mg/dL Final  . Creat 06/10/2020 0.93  0.50 - 0.99 mg/dL Final   Comment: For patients >42 years of age, the reference limit for Creatinine is approximately 13% higher for people identified as  African-American. .   . GFR, Est Non African American 06/10/2020 64  > OR = 60 mL/min/1.23m Final  . GFR, Est African American 06/10/2020 74  > OR = 60 mL/min/1.724mFinal  . BUN/Creatinine Ratio 0276/19/5093OT APPLICABLE  6 - 22 (calc) Final  . Sodium 06/10/2020 138  135 - 146 mmol/L Final  . Potassium 06/10/2020 3.7  3.5 - 5.3 mmol/L Final  . Chloride 06/10/2020 100  98 - 110 mmol/L Final  . CO2 06/10/2020 29  20 - 32 mmol/L Final  . Calcium 06/10/2020 9.7  8.6 - 10.4 mg/dL Final  . Total Protein 06/10/2020 6.9  6.1 - 8.1 g/dL Final  . Albumin 06/10/2020 4.3  3.6 - 5.1 g/dL Final  . Globulin 06/10/2020 2.6  1.9 - 3.7 g/dL (calc) Final  . AG Ratio 06/10/2020 1.7  1.0 - 2.5 (calc) Final  . Total Bilirubin 06/10/2020 0.7  0.2 - 1.2 mg/dL Final  . Alkaline phosphatase (APISO) 06/10/2020 76  37 - 153 U/L Final  . AST 06/10/2020 18  10 - 35 U/L  Final  . ALT 06/10/2020 13  6 - 29 U/L Final  . Sed Rate 06/10/2020 14  0 - 30 mm/h Final  . Total CK 06/10/2020 78  29 - 143 U/L Final  . Anti Nuclear Antibody (ANA) 06/10/2020 NEGATIVE  NEGATIVE Final   Comment: ANA IFA is a first line screen for detecting the presence of up to approximately 150 autoantibodies in various autoimmune diseases. A negative ANA IFA result suggests an ANA-associated autoimmune disease is not present at this time, but is not definitive. If there is high clinical suspicion for Sjogren's syndrome, testing for anti-SS-A/Ro antibody should be considered. Anti-Jo-1 antibody should be considered for clinically suspected inflammatory myopathies. . AC-0: Negative . International Consensus on ANA Patterns ([email or web address]://www.[email or web address]/. For additional information, please refer to http://education.[facility].com/faq/FAQ177 (This link is being provided for informational/ educational purposes only.) .   . Marland Kitchenhuematoid fact SerPl-aCnc 06/10/2020 <14  <14 IU/mL Final   Patient states that she feels much better after discontinuing rosuvastatin.  This is been the second statin in a row that she has heard with.  She previously had a bad reaction to pravastatin as well as now rosuvastatin.  She also believes that she took atorvastatin in the past and had myalgias to this as well.  She states that almost all of her pain has gone away simply after stopping rosuvastatin for just 2 weeks. Past Medical History:  Diagnosis Date  . Abdominal aortic aneurysm (AAA) 3.0 cm [facility] 5.[facility]   None seen on prior imaging?   . Marland Kitchennxiety   . Cataract   . Chronic left shoulder pain   . Headache   . Hiatal hernia    small  . Hyperlipidemia   . Hypertension   . Pre-diabetes   . Schatzki's ring   . Seasonal allergies   . Tremor    Past Surgical History:  Procedure Laterality Date  . CARDIAC CATHETERIZATION  2005   "Ms. ChHamoras  essentially normal coronary arteries and normal left ventricular function.  She will be treated empirically with anti-reflux measures"  . CATARACT EXTRACTION, BILATERAL    . CHOLECYSTECTOMY    . COLONOSCOPY    06/16/2004   RMOIZ:TIWPYKectum/Diminutive polyps of the splenic flexure and the cecum/The remainder of the colonic mucosa appeared normal pathology (hyperplastic polyps).  . COLONOSCOPY N/A 08/07/2015   Dr. RoGala Romney6 mm descending colon tubular  adenoma, multiple medium-mouthed diverticula in sigmoid colon. surveillance 2022  . ESOPHAGOGASTRODUODENOSCOPY  09/11/2001   TIW:PYKDXIPJA ring otherwise normal esophagus/Normal stomach/Normal D1 and D2 status post passage of a 56 [place] dilator  . ESOPHAGOGASTRODUODENOSCOPY N/A 11/11/2017   Procedure: ESOPHAGOGASTRODUODENOSCOPY (EGD);  Surgeon: Daneil Dolin, MD; mild Schatzki ring s/p dilation and mild erosive gastropathy.  . ESOPHAGOGASTRODUODENOSCOPY (EGD) WITH ESOPHAGEAL DILATION N/A 01/25/2013   Dr. Gala Romney- schatzki's ring- 59 F dilation. small hiatal hernia  . HYSTEROSCOPY WITH D & C N/A 03/06/2019   Procedure: DILATATION AND CURETTAGE /HYSTEROSCOPY;  Surgeon: Jonnie Kind, MD;  Location: [facility];  Service: Gynecology;  Laterality: N/A;  . [name] DILATION N/A 11/11/2017   Procedure: [name] DILATION;  Surgeon: Daneil Dolin, MD;  Location: [facility];  Service: Endoscopy;  Laterality: N/A;  . POLYPECTOMY N/A 03/06/2019   Procedure: POLYPECTOMY ( REMOVAL OF ENDOMETRIAL POLYP);  Surgeon: Jonnie Kind, MD;  Location: [facility];  Service: Gynecology;  Laterality: N/A;  . TUBAL LIGATION     Current Outpatient Medications on File Prior to Visit  Medication Sig Dispense Refill  . acetaminophen (TYLENOL) 500 MG tablet Take 500 mg by mouth every 6 (six) hours as needed for moderate pain or headache.    . ALPRAZolam (XANAX) 0.5 MG tablet TAKE 1 TABLET(0.5 MG) BY MOUTH TWICE DAILY AS NEEDED FOR ANXIETY 60 tablet 0  . amLODipine (NORVASC)  10 MG tablet TAKE 1 TABLET(10 MG) BY MOUTH AT BEDTIME 90 tablet 3  . aspirin EC 81 MG tablet Take 81 mg by mouth at bedtime.     . cholecalciferol (VITAMIN D3) 25 MCG (1000 UT) tablet Take 2,000 Units by mouth daily.    . Cyanocobalamin (B-12) 2500 MCG TABS Take 2,500 mcg by mouth daily.    Marland Kitchen EPINEPHrine 0.3 mg/0.3 mL IJ SOAJ injection Inject 0.3 mg into the muscle as needed for anaphylaxis.     Marland Kitchen escitalopram (LEXAPRO) 10 MG tablet Take 1 tablet (10 mg total) by mouth daily. 30 tablet 5  . fluticasone (FLONASE) 50 MCG/ACT nasal spray SHAKE LIQUID AND USE 2 SPRAYS IN EACH NOSTRIL DAILY 16 g 6  . gentamicin ointment (GARAMYCIN) 0.1 % 3 (three) times daily.    . hydrochlorothiazide (HYDRODIURIL) 25 MG tablet Take 1 tablet (25 mg total) by mouth daily. 30 tablet 11  . hydrOXYzine (ATARAX/VISTARIL) 25 MG tablet Take 0.5-1 tablets (12.5-25 mg total) by mouth every 8 (eight) hours as needed for itching. 20 tablet 0  . ipratropium (ATROVENT) 0.03 % nasal spray INSTILL 2 SPRAYS IN EACH NOSTRIL EVERY 12 HOURS 30 mL 12  . levocetirizine (XYZAL) 5 MG tablet SMARTSIG:1 Tablet(s) By Mouth Every Evening    . LINZESS 145 MCG CAPS capsule TAKE 1 CAPSULE(145 MCG) BY MOUTH DAILY BEFORE BREAKFAST 30 capsule 3  . loratadine (CLARITIN) 10 MG tablet Take 10 mg by mouth daily as needed for allergies.    Marland Kitchen MAGNESIUM PO Take 330 mg by mouth daily.    . metoprolol succinate (TOPROL-XL) 100 MG 24 hr tablet Take 1 tablet (100 mg total) by mouth daily. 90 tablet 3  . mometasone (ELOCON) 0.1 % cream Apply 1 application topically daily as needed (for irritation).     . montelukast (SINGULAIR) 10 MG tablet TAKE 1 TABLET(10 MG) BY MOUTH AT BEDTIME (Patient taking differently: Take 10 mg by mouth at bedtime.) 30 tablet 3  . ondansetron (ZOFRAN) 4 MG tablet Take 1 tablet (4 mg total) by mouth every 8 (eight) hours  as needed for nausea or vomiting. 30 tablet 1  . pantoprazole (PROTONIX) 40 MG tablet TAKE 1 TABLET BY MOUTH TWICE  DAILY 30 MINUTES BEFORE MEALS 60 tablet 5  . potassium chloride SA (KLOR-CON) 20 MEQ tablet TAKE 1 TABLET(20 MEQ) BY MOUTH DAILY 30 tablet 5  . primidone (MYSOLINE) 50 MG tablet TAKE 1 TABLET(50 MG) BY MOUTH IN THE MORNING AND AT BEDTIME 60 tablet 2  . rosuvastatin (CRESTOR) 5 MG tablet Take 1 tablet (5 mg total) by mouth daily. 90 tablet 3  . sodium chloride (OCEAN) 0.65 % SOLN nasal spray Place 1 spray into both nostrils as needed for congestion.    . tacrolimus (PROTOPIC) 0.1 % ointment Apply 1 application topically daily as needed (for irritation).     . triamcinolone cream (KENALOG) 0.1 % APPLY EXTERNALLY TO THE AFFECTED AREA TWICE DAILY (Patient taking differently: Apply 1 application topically 2 (two) times daily as needed (Rash).) 45 g 0  . valsartan (DIOVAN) 320 MG tablet TAKE 1 TABLET BY MOUTH EVERY DAY 90 tablet 1  . vitamin C (ASCORBIC ACID) 250 MG tablet Take 500 mg by mouth daily.    . vitamin E 400 UNIT capsule Take 400 Units by mouth daily.    Marland Kitchen zinc gluconate 50 MG tablet Take 50 mg by mouth daily.     No current facility-administered medications on file prior to visit.   Allergies  Allergen Reactions  . Ciprofloxacin Shortness Of Breath  . Latex Other (See Comments)    Burns skin   Social History   Socioeconomic History  . Marital status: Single    Spouse name: Not on file  . Number of children: 3  . Years of education: Not on file  . Highest education level: Not on file  Occupational History  . Occupation: English as a second language teacher: [employer]  Tobacco Use  . Smoking status: Former Smoker    Packs/day: 0.25    Years: 35.00    Pack years: 8.75    Types: Cigarettes    Quit date: 12/31/2012    Years since quitting: 7.5  . Smokeless tobacco: Never Used  Vaping Use  . Vaping Use: Never used  Substance and Sexual Activity  . Alcohol use: Not Currently  . Drug use: No  . Sexual activity: Not Currently    Partners: Male    Birth control/protection:  Post-menopausal, Surgical    Comment: tubal  Other Topics Concern  . Not on file  Social History Narrative   Eats all food groups.    Wears seatbelt.    Has 3 children.   Prior smoker.   Attends church.    Used to work in [employer].    Drives.   Enjoys going out with family.    Social Determinants of Health   Financial Resource Strain: Low Risk   . Difficulty of Paying Living Expenses: Not very hard  Food Insecurity: Not on file  Transportation Needs: Not on file  Physical Activity: Not on file  Stress: Not on file  Social Connections: Not on file  Intimate Partner Violence: Not on file   Family History  Problem Relation Age of Onset  . Diabetes Other   . Diabetes Mother   . Heart disease Mother   . Heart disease Father   . Diabetes Sister   . Diabetes Brother   . Hypertension Brother   . Diabetes Daughter   . Hypertension Maternal Grandmother   . Stroke Maternal Grandfather   .  Early death Paternal Grandfather   . Colon cancer Neg Hx   . Liver disease Neg Hx       Review of Systems  All other systems reviewed and are negative.      Objective:   Physical Exam Vitals reviewed.  Constitutional:      Appearance: She is well-developed.  Eyes:     General: No visual field deficit.    Extraocular Movements:     Right eye: Normal extraocular motion and no nystagmus.     Left eye: Normal extraocular motion and no nystagmus.     Pupils:     Right eye: Pupil is round and reactive.     Left eye: Pupil is round and reactive.  Cardiovascular:     Rate and Rhythm: Normal rate and regular rhythm.     Heart sounds: Normal heart sounds. No murmur heard. No friction rub. No gallop.   Pulmonary:     Effort: Pulmonary effort is normal. No respiratory distress.     Breath sounds: Normal breath sounds. No stridor. No wheezing or rales.  Chest:     Chest wall: No tenderness.  Musculoskeletal:     Right hand: No swelling, deformity, tenderness or bony tenderness.      Left hand: Normal. No swelling, deformity, tenderness or bony tenderness. Normal range of motion.     Cervical back: Normal range of motion. No rigidity.     Left knee: Decreased range of motion. Tenderness present over the medial joint line and lateral joint line.  Neurological:     Mental Status: She is alert and oriented to person, place, and time. Mental status is at baseline.     Cranial Nerves: No cranial nerve deficit, dysarthria or facial asymmetry.     Sensory: No sensory deficit.     Motor: Tremor present. No weakness or abnormal muscle tone.     Coordination: Romberg sign negative. Coordination normal.     Gait: Gait normal.           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  We discussed not using any cholesterol medication versus trying another statin versus trying Zetia.  She would like to try Zetia 10 mg a day and then recheck labs in 3 months

## 2020-07-01 DIAGNOSIS — M25462 Effusion, left knee: Secondary | ICD-10-CM | POA: Diagnosis not present

## 2020-07-01 DIAGNOSIS — M1712 Unilateral primary osteoarthritis, left knee: Secondary | ICD-10-CM | POA: Diagnosis not present

## 2020-07-01 DIAGNOSIS — M25562 Pain in left knee: Secondary | ICD-10-CM | POA: Diagnosis not present

## 2020-07-02 ENCOUNTER — Other Ambulatory Visit: Payer: Self-pay

## 2020-07-02 ENCOUNTER — Ambulatory Visit (HOSPITAL_COMMUNITY)
Admission: RE | Admit: 2020-07-02 | Discharge: 2020-07-02 | Disposition: A | Discharge status: Home / Self Care | Payer: Medicare Other | Source: Ambulatory Visit | Attending: Vascular Surgery | Admitting: Vascular Surgery

## 2020-07-02 ENCOUNTER — Encounter: Payer: Self-pay | Admitting: Vascular Surgery

## 2020-07-02 ENCOUNTER — Ambulatory Visit: Payer: Medicare Other | Admitting: Vascular Surgery

## 2020-07-02 VITALS — BP 144/56 | HR 68 | Temp 97.9°F | Resp 20 | Ht 67.0 in | Wt 174.0 lb

## 2020-07-02 DIAGNOSIS — I83811 Varicose veins of right lower extremities with pain: Secondary | ICD-10-CM | POA: Diagnosis not present

## 2020-07-02 DIAGNOSIS — I83813 Varicose veins of bilateral lower extremities with pain: Secondary | ICD-10-CM | POA: Diagnosis not present

## 2020-07-02 NOTE — Progress Notes (Signed)
Referring Provider: Susy Frizzle, MD Primary Care Physician:  Susy Frizzle, MD Primary GI Physician: Dr. Gala Romney  Chief Complaint  Patient presents with  . Abdominal Pain    Mid upper abd  . Gastroesophageal Reflux    "little"  . Constipation    Has bm daily, Linzess helping    HPI:   [name] is a 65 y.o. female presenting today with a history of GERD, nausea, epigastric pain, dysphagia, and constipation.  EGD in 2019 with mild Schatzki's ring s/p dilation and mild erosive gastropathy, no specimens collected.  H. pylori breath test negative in April 2020.  Delayed gastric emptying documented in 2004 but normal GES in April 2021.  RUQ ultrasound in April 2021 with no significant findings, history of cholecystectomy.  Colonoscopy in March 2017 with 6 mm ascending colon tubular adenoma, multiple medical diverticula in the sigmoid colon, due for surveillance 08/06/20 or after.  She is presenting today for follow-up.  Last seen in our office 11/14/2019.  Stated nausea and early satiety had been doing fairly well.  Only had 2 episodes of nausea in the last 3 months. Weight stable. Had tried Dexilant after previous visit in April, but this caused diarrhea so she resumed Protonix twice daily.  GERD fairly well controlled.  Occasional breakthrough symptoms if taking Protonix too late.  Eating within 3 hours of going to bed and suspect that this may be contributing to intermittent nausea.  Also continue with with quick, sharp epigastric pain that will be present intermittently for a few days, then resolves on its own for months.  Pain lasts only seconds and triggered by twisting at times.  Not triggered by eating.  Denies NSAIDs.  No other abdominal pain.  Constipation is well controlled on Linzess 145 mcg.  Plan to continue her current medication, counseled on GERD diet/lifestyle, and follow-up in 6 months.  Today:   GERD:  Fairly well controlled. No regular breakthrough symptoms.  Sleeping propped up and now eating early before bed. Taking Protonix BID. Occasional nausea without vomiting.  Sometimes thinks it is coming from vitamins she is taking. A couple of weeks ago, she had nausea daily for a couple of weeks. Didn't feel GERD was flared at that time. Typically nausea comes and goes within a few minutes. Not triggered by meals. Has Zofran to take if needed. Doesn't usually require this. Later remembered prior to the onset of nausea couple weeks ago, she was taking antibiotic for sinus infection which also caused significant diarrhea and abdominal discomfort.  After stopping the antibiotic, diarrhea, abdominal discomfort, and significant nausea resolved. Also had weight loss while on antibiotics, but she has gained her weight back since then.   Occasional epigastric twinge. Flies across and is gone. Random. None for a few months, then occurs. No trigger. Not affected by meals or BMs.  Chronic and unchanged.  No NSAIDs.   Constipation: Having small BMs every morning. Stools are still on the harder side with incomplete emptying. No blood in the stool or black stools. Eats a lot of vegetables and drinking plenty of water.   Planning to have varicose vein surgery soon.   Past Medical History:  Diagnosis Date  . Abdominal aortic aneurysm (AAA) 3.0 cm to 5.0 cm in diameter in female [facility])    None seen on prior imaging?   Marland Kitchen Anxiety   . Cataract   . Chronic left shoulder pain   . Headache   . Hiatal hernia  small  . Hyperlipidemia   . Hypertension   . Pre-diabetes   . Schatzki's ring   . Seasonal allergies   . Tremor     Past Surgical History:  Procedure Laterality Date  . CARDIAC CATHETERIZATION  2005   "Ms. Mackins has essentially normal coronary arteries and normal left ventricular function.  She will be treated empirically with anti-reflux measures"  . CATARACT EXTRACTION, BILATERAL    . CHOLECYSTECTOMY    . COLONOSCOPY    06/16/2004   RCV:ELFYBO  rectum/Diminutive polyps of the splenic flexure and the cecum/The remainder of the colonic mucosa appeared normal pathology (hyperplastic polyps).  . COLONOSCOPY N/A 08/07/2015   Dr. Gala Romney: 6 mm descending colon tubular adenoma, multiple medium-mouthed diverticula in sigmoid colon. surveillance 2022  . ESOPHAGOGASTRODUODENOSCOPY  09/11/2001   FBP:ZWCHENIDP ring otherwise normal esophagus/Normal stomach/Normal D1 and D2 status post passage of a 56 [place] dilator  . ESOPHAGOGASTRODUODENOSCOPY N/A 11/11/2017   Procedure: ESOPHAGOGASTRODUODENOSCOPY (EGD);  Surgeon: Daneil Dolin, MD; mild Schatzki ring s/p dilation and mild erosive gastropathy.  . ESOPHAGOGASTRODUODENOSCOPY (EGD) WITH ESOPHAGEAL DILATION N/A 01/25/2013   Dr. Gala Romney- schatzki's ring- 61 F dilation. small hiatal hernia  . HYSTEROSCOPY WITH D & C N/A 03/06/2019   Procedure: DILATATION AND CURETTAGE /HYSTEROSCOPY;  Surgeon: Jonnie Kind, MD;  Location: [facility];  Service: Gynecology;  Laterality: N/A;  . [name] DILATION N/A 11/11/2017   Procedure: [name] DILATION;  Surgeon: Daneil Dolin, MD;  Location: [facility] SUITE;  Service: Endoscopy;  Laterality: N/A;  . POLYPECTOMY N/A 03/06/2019   Procedure: POLYPECTOMY ( REMOVAL OF ENDOMETRIAL POLYP);  Surgeon: Jonnie Kind, MD;  Location: [facility];  Service: Gynecology;  Laterality: N/A;  . TUBAL LIGATION      Current Outpatient Medications  Medication Sig Dispense Refill  . acetaminophen (TYLENOL) 500 MG tablet Take 500 mg by mouth every 6 (six) hours as needed for moderate pain or headache.    . ALPRAZolam (XANAX) 0.5 MG tablet TAKE 1 TABLET(0.5 MG) BY MOUTH TWICE DAILY AS NEEDED FOR ANXIETY 60 tablet 0  . amLODipine (NORVASC) 10 MG tablet TAKE 1 TABLET(10 MG) BY MOUTH AT BEDTIME 90 tablet 3  . aspirin EC 81 MG tablet Take 81 mg by mouth at bedtime.     . cholecalciferol (VITAMIN D3) 25 MCG (1000 UT) tablet Take 2,000 Units by mouth daily.    . Cyanocobalamin (B-12) 2500 MCG  TABS Take 2,500 mcg by mouth daily.    Marland Kitchen doxazosin (CARDURA) 2 MG tablet Take 2 mg by mouth daily.    Marland Kitchen EPINEPHrine 0.3 mg/0.3 mL IJ SOAJ injection Inject 0.3 mg into the muscle as needed for anaphylaxis.     Marland Kitchen escitalopram (LEXAPRO) 10 MG tablet Take 1 tablet (10 mg total) by mouth daily. 30 tablet 5  . esomeprazole (NEXIUM) 40 MG capsule Take 1 capsule (40 mg total) by mouth 2 (two) times daily before a meal. 60 capsule 3  . ezetimibe (ZETIA) 10 MG tablet Take 1 tablet (10 mg total) by mouth daily. 90 tablet 3  . fluticasone (FLONASE) 50 MCG/ACT nasal spray SHAKE LIQUID AND USE 2 SPRAYS IN EACH NOSTRIL DAILY 16 g 6  . gentamicin ointment (GARAMYCIN) 0.1 % as needed.    . hydrochlorothiazide (HYDRODIURIL) 25 MG tablet Take 1 tablet (25 mg total) by mouth daily. 30 tablet 11  . hydrOXYzine (ATARAX/VISTARIL) 25 MG tablet Take 0.5-1 tablets (12.5-25 mg total) by mouth every 8 (eight) hours as needed for itching. [place]  tablet 0  . ipratropium (ATROVENT) 0.03 % nasal spray INSTILL 2 SPRAYS IN EACH NOSTRIL EVERY 12 HOURS 30 mL 12  . levocetirizine (XYZAL) 5 MG tablet SMARTSIG:1 Tablet(s) By Mouth Every Evening    . LINZESS 145 MCG CAPS capsule TAKE 1 CAPSULE(145 MCG) BY MOUTH DAILY BEFORE BREAKFAST 30 capsule 3  . loratadine (CLARITIN) 10 MG tablet Take 10 mg by mouth daily as needed for allergies.    Marland Kitchen MAGNESIUM PO Take 330 mg by mouth daily.    . metoprolol succinate (TOPROL-XL) 100 MG 24 hr tablet Take 1 tablet (100 mg total) by mouth daily. 90 tablet 3  . mometasone (ELOCON) 0.1 % cream Apply 1 application topically daily as needed (for irritation).     . montelukast (SINGULAIR) 10 MG tablet TAKE 1 TABLET(10 MG) BY MOUTH AT BEDTIME (Patient taking differently: Take 10 mg by mouth at bedtime.) 30 tablet 3  . ondansetron (ZOFRAN) 4 MG tablet Take 1 tablet (4 mg total) by mouth every 8 (eight) hours as needed for nausea or vomiting. 30 tablet 1  . potassium chloride SA (KLOR-CON) 20 MEQ tablet TAKE 1  TABLET(20 MEQ) BY MOUTH DAILY 30 tablet 5  . sodium chloride (OCEAN) 0.65 % SOLN nasal spray Place 1 spray into both nostrils as needed for congestion.    . tacrolimus (PROTOPIC) 0.1 % ointment Apply 1 application topically daily as needed (for irritation).     . triamcinolone cream (KENALOG) 0.1 % APPLY EXTERNALLY TO THE AFFECTED AREA TWICE DAILY (Patient taking differently: Apply 1 application topically 2 (two) times daily as needed (Rash).) 45 g 0  . valsartan (DIOVAN) 320 MG tablet TAKE 1 TABLET BY MOUTH EVERY DAY 90 tablet 1  . vitamin C (ASCORBIC ACID) 250 MG tablet Take 500 mg by mouth daily.    . vitamin E 400 UNIT capsule Take 400 Units by mouth daily.    Marland Kitchen zinc gluconate 50 MG tablet Take 50 mg by mouth daily.     No current facility-administered medications for this visit.    Allergies as of 07/03/2020 - Review Complete 07/03/2020  Allergen Reaction Noted  . Ciprofloxacin Shortness Of Breath 12/27/2011  . Latex Other (See Comments) 10/17/2019  . Statins  06/30/2020    Family History  Problem Relation Age of Onset  . Diabetes Other   . Diabetes Mother   . Heart disease Mother   . Heart disease Father   . Diabetes Sister   . Diabetes Brother   . Hypertension Brother   . Diabetes Daughter   . Hypertension Maternal Grandmother   . Stroke Maternal Grandfather   . Early death Paternal Grandfather   . Colon cancer Neg Hx   . Liver disease Neg Hx     Social History   Socioeconomic History  . Marital status: Single    Spouse name: Not on file  . Number of children: 3  . Years of education: Not on file  . Highest education level: Not on file  Occupational History  . Occupation: English as a second language teacher: [employer]  Tobacco Use  . Smoking status: Former Smoker    Packs/day: 0.25    Years: 35.00    Pack years: 8.75    Types: Cigarettes    Quit date: 12/31/2012    Years since quitting: 7.5  . Smokeless tobacco: Never Used  Vaping Use  . Vaping Use: Never  used  Substance and Sexual Activity  . Alcohol use: Not Currently  . Drug  use: No  . Sexual activity: Not Currently    Partners: Male    Birth control/protection: Post-menopausal, Surgical    Comment: tubal  Other Topics Concern  . Not on file  Social History Narrative   Eats all food groups.    Wears seatbelt.    Has 3 children.   Prior smoker.   Attends church.    Used to work in [employer].    Drives.   Enjoys going out with family.    Social Determinants of Health   Financial Resource Strain: Low Risk   . Difficulty of Paying Living Expenses: Not very hard  Food Insecurity: Not on file  Transportation Needs: Not on file  Physical Activity: Not on file  Stress: Not on file  Social Connections: Not on file    Review of Systems: Gen: Denies fever, chills, cold or flulike symptoms, presyncope, syncope. CV: Denies chest pain or palpitations. Resp: Denies dyspnea or cough. GI: See HPI Heme: See HPI  Physical Exam: BP (!) 162/74   Pulse 64   Temp (!) 96.9 F (36.1 C) (Temporal)   Ht 5\' 7"  (1.702 m)   Wt 177 lb 3.2 oz (80.4 kg)   BMI 27.75 kg/m  General:   Alert and oriented. No distress noted. Pleasant and cooperative.  Head:  Normocephalic and atraumatic. Eyes:  Conjuctiva clear without scleral icterus. Heart:  S1, S2 present without murmurs appreciated. Lungs:  Clear to auscultation bilaterally. No wheezes, rales, or rhonchi. No distress.  Abdomen:  +BS, soft, non-tender and non-distended. No rebound or guarding. No HSM or masses noted. Msk:  Symmetrical without gross deformities. Normal posture. Extremities:  Without edema. Neurologic:  Alert and  oriented x4 Psych: Normal mood and affect.  Assessment: 65 year old female with history of GERD, nausea, epigastric pain, dysphagia, and adenomatous colon polyp due for surveillance colonoscopy next month presenting today for follow-up.   Nausea without vomiting: Occurring a couple times a week.  Short-lived with  no identified trigger.  Has Zofran to use as needed, but does not typically require this.  EGD in 2019 with mild erosive gastropathy, no specimens collected.  H. pylori breath test - April 2020.  Delayed gastric emptying documented 2004, but normal GES in April 2021.  RUQ ultrasound April 2021 with no significant findings, history of cholecystectomy.  Previously, nausea improved with increasing Protonix to 40 mg twice daily.  Typical GERD is currently well controlled; however, nausea may be secondary to atypical GERD.  Also with history of constipation that is not adequately managed and may also be contributing to intermittent nausea.  We will plan to change PPI and increase Linzess.  Constipation: Currently taking Linzess 145 mcg daily.  This has improved her symptoms with BMs every morning, but stools are very hard and with incomplete emptying.  No alarm symptoms.  She is due for surveillance colonoscopy next month; however, we will hold off on scheduling this for now while we work on better management of constipation.  Plan to increase Linzess.  Epigastric pain: Does not seem to be a significant issue at this point.  Occurs randomly, every few months, and last only a couple of seconds.  Patient states that the little shooting pain across the upper abdomen.  Not particularly bothersome at this point.  Significant upper GI evaluation has been been completed as per above.  For now, we will monitor for any worsening.  Plan: 1.  Stop Protonix and start Nexium 40 mg twice daily. 2.  Provided general  GERD diet/lifestyle instructions. 3.  Increase Linzess to 290 mcg daily.  Patient has several bottles of Linzess 145 mcg at home and prefers to use this at first.  She will start taking 2 Linzess 145 mcg every morning.  Requested progress report. 4.  Follow-up in 3 months.  Will discuss scheduling colonoscopy at that time.

## 2020-07-02 NOTE — Progress Notes (Signed)
Patient is 65 year old female who returns for follow-up today.  She was last seen in November 2021.  She complains primarily of pain over a cluster of varicosities in her right calf.  At her last office visit thigh-high compression stockings were prescribed.  She had some difficulty with this initially because the elastic band was causing a skin reaction on her the high thigh.  She has been wearing compression regularly.  She states they improve the symptoms but certainly have not alleviated them.  She does not really have a whole lot of symptoms in the left leg currently.  She is having trouble with her left knee and had a cortisone injection of it recently.  Review of systems: She has no shortness of breath.  She has no chest pain.  Physical exam:  Vitals:   07/02/20 1338  BP: (!) 144/56  Pulse: 68  Resp: 20  Temp: 97.9 F (36.6 C)  SpO2: 99%  Weight: 174 lb (78.9 kg)  Height: 5\' 7"  (1.702 m)    Extremities: Cluster of varicosities right medial calf mildly tender to palpation, few scattered spider type varicosities in the thigh  Data: I did a SonoSite ultrasound of the patient's right thigh today this showed a 4 mm right greater saphenous vein with reflux.  It dilated up to about 5 mm just before the saphenofemoral junction.  She also had a formal duplex exam today.  I reviewed and interpreted the study.  In the right leg there was reflux at the saphenofemoral junction and right portion of the saphenous vein in the thigh.  There is also reflux in the right lesser saphenous vein.  Greater saphenous vein was 5 to 7 mm from the thigh up to the saphenofemoral junction.  It became fairly small less than 3 mm in diameter from the distal thigh to the knee.  Lesser saphenous vein also had some reflux but was only about 2-1/2 mm diameter.  There was also an accessory saphenous vein which was also only about 4 mm.  No reflux noted in this.  In the left side she had common femoral and femoral vein  reflux but no reflux in the greater saphenous vein.  Reviewed and interpreted the study.  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  This is primarily over a cluster of varicosities in the right medial calf.  Plan: Laser ablation right greater saphenous vein from the mid thigh up to the saphenofemoral junction and then greater than 20 stab avulsions.  Risk benefits possible complications and procedure details were explained to the patient today include not limited to bleeding infection deep vein thrombosis risk of 100,000 low chance of nerve injury.  She understands and agrees to proceed.  Ruta Hinds, MD [facility] Office: [phone]

## 2020-07-03 ENCOUNTER — Encounter: Payer: Self-pay | Admitting: Gastroenterology

## 2020-07-03 ENCOUNTER — Ambulatory Visit: Payer: Medicare Other | Admitting: Gastroenterology

## 2020-07-03 VITALS — BP 162/74 | HR 64 | Temp 96.9°F | Ht 67.0 in | Wt 177.2 lb

## 2020-07-03 DIAGNOSIS — K5900 Constipation, unspecified: Secondary | ICD-10-CM

## 2020-07-03 DIAGNOSIS — Z86010 Personal history of colonic polyps: Secondary | ICD-10-CM

## 2020-07-03 DIAGNOSIS — K219 Gastro-esophageal reflux disease without esophagitis: Secondary | ICD-10-CM

## 2020-07-03 DIAGNOSIS — R110 Nausea: Secondary | ICD-10-CM

## 2020-07-03 MED ORDER — ESOMEPRAZOLE MAGNESIUM 40 MG PO CPDR: 40.0000 mg | DELAYED_RELEASE_CAPSULE | Freq: Two times a day (BID) | ORAL | 3 refills | Status: DC

## 2020-07-03 NOTE — Patient Instructions (Signed)
Please stop Protonix and start Nexium 40 mg twice daily 30 minutes before breakfast and dinner.   General acid reflux diet/lifestyle recommendations:  Avoid fried, fatty, greasy, spicy, citrus foods. Avoid caffeine and carbonated beverages. Avoid chocolate. Try eating 4-6 small meals a day rather than 3 large meals. Do not eat within 3 hours of laying down. Prop head of bed up on wood or bricks to create a 6 inch incline.  For constipation, take 2 Linzess 145 mcg capsules every morning and let me know how this does for you. If it works well, I can send in a new Rx for the higher dose.   We will plan to see you back in 3 months for follow-up and to discuss scheduling colonoscopy.   It was good to see you today!   [name], PA-C [facility]

## 2020-07-24 DIAGNOSIS — S83242A Other tear of medial meniscus, current injury, left knee, initial encounter: Secondary | ICD-10-CM | POA: Diagnosis not present

## 2020-07-28 ENCOUNTER — Ambulatory Visit: Payer: Medicare Other | Admitting: Neurology

## 2020-07-28 ENCOUNTER — Telehealth: Payer: Self-pay

## 2020-07-28 NOTE — Telephone Encounter (Signed)
Received pre op for for pt left knee arthroscopy. Form has been given to pcp for completion

## 2020-07-31 ENCOUNTER — Encounter (HOSPITAL_COMMUNITY): Payer: Self-pay | Admitting: Emergency Medicine

## 2020-07-31 ENCOUNTER — Emergency Department (HOSPITAL_COMMUNITY)
Admission: EM | Admit: 2020-07-31 | Discharge: 2020-07-31 | Disposition: A | Discharge status: Home / Self Care | Payer: Medicare Other | Attending: Emergency Medicine | Admitting: Emergency Medicine

## 2020-07-31 ENCOUNTER — Emergency Department (HOSPITAL_COMMUNITY): Payer: Medicare Other

## 2020-07-31 DIAGNOSIS — Z79899 Other long term (current) drug therapy: Secondary | ICD-10-CM | POA: Insufficient documentation

## 2020-07-31 DIAGNOSIS — Z7982 Long term (current) use of aspirin: Secondary | ICD-10-CM | POA: Insufficient documentation

## 2020-07-31 DIAGNOSIS — R519 Headache, unspecified: Secondary | ICD-10-CM | POA: Diagnosis not present

## 2020-07-31 DIAGNOSIS — J019 Acute sinusitis, unspecified: Secondary | ICD-10-CM | POA: Insufficient documentation

## 2020-07-31 DIAGNOSIS — H5712 Ocular pain, left eye: Secondary | ICD-10-CM

## 2020-07-31 DIAGNOSIS — Z0389 Encounter for observation for other suspected diseases and conditions ruled out: Secondary | ICD-10-CM | POA: Diagnosis not present

## 2020-07-31 DIAGNOSIS — I10 Essential (primary) hypertension: Secondary | ICD-10-CM | POA: Insufficient documentation

## 2020-07-31 DIAGNOSIS — E119 Type 2 diabetes mellitus without complications: Secondary | ICD-10-CM | POA: Insufficient documentation

## 2020-07-31 DIAGNOSIS — H53142 Visual discomfort, left eye: Secondary | ICD-10-CM | POA: Insufficient documentation

## 2020-07-31 DIAGNOSIS — J329 Chronic sinusitis, unspecified: Secondary | ICD-10-CM

## 2020-07-31 DIAGNOSIS — R0981 Nasal congestion: Secondary | ICD-10-CM | POA: Diagnosis present

## 2020-07-31 DIAGNOSIS — Z87891 Personal history of nicotine dependence: Secondary | ICD-10-CM | POA: Insufficient documentation

## 2020-07-31 DIAGNOSIS — H547 Unspecified visual loss: Secondary | ICD-10-CM | POA: Diagnosis not present

## 2020-07-31 LAB — CBC WITH DIFFERENTIAL/PLATELET
Abs Immature Granulocytes: 0.01 10*3/uL (ref 0.00–0.07)
Basophils Absolute: 0 10*3/uL (ref 0.0–0.1)
Basophils Relative: 0 %
Eosinophils Absolute: 0.2 10*3/uL (ref 0.0–0.5)
Eosinophils Relative: 3 %
HCT: 40.3 % (ref 36.0–46.0)
Hemoglobin: 13.7 g/dL (ref 12.0–15.0)
Immature Granulocytes: 0 %
Lymphocytes Relative: 59 %
Lymphs Abs: 5.3 10*3/uL — ABNORMAL HIGH (ref 0.7–4.0)
MCH: 30.2 pg (ref 26.0–34.0)
MCHC: 34 g/dL (ref 30.0–36.0)
MCV: 89 fL (ref 80.0–100.0)
Monocytes Absolute: 0.5 10*3/uL (ref 0.1–1.0)
Monocytes Relative: 6 %
Neutro Abs: 2.9 10*3/uL (ref 1.7–7.7)
Neutrophils Relative %: 32 %
Platelets: 294 10*3/uL (ref 150–400)
RBC: 4.53 MIL/uL (ref 3.87–5.11)
RDW: 12.1 % (ref 11.5–15.5)
WBC: 8.9 10*3/uL (ref 4.0–10.5)
nRBC: 0 % (ref 0.0–0.2)

## 2020-07-31 LAB — BASIC METABOLIC PANEL
Anion gap: 7 (ref 5–15)
BUN: 7 mg/dL — ABNORMAL LOW (ref 8–23)
CO2: 30 mmol/L (ref 22–32)
Calcium: 9.9 mg/dL (ref 8.9–10.3)
Chloride: 100 mmol/L (ref 98–111)
Creatinine, Ser: 0.79 mg/dL (ref 0.44–1.00)
GFR, Estimated: >60 mL/min (ref >60)
Glucose, Bld: 89 mg/dL (ref 70–99)
Potassium: 3.3 mmol/L — ABNORMAL LOW (ref 3.5–5.1)
Sodium: 137 mmol/L (ref 135–145)

## 2020-07-31 MED ORDER — TETRACAINE HCL 0.5 % OP SOLN
1.0000 [drp] | Freq: Once | OPHTHALMIC | Status: DC
  Filled 2020-07-31: qty 4

## 2020-07-31 MED ORDER — FLUORESCEIN SODIUM 1 MG OP STRP
1.0000 | ORAL_STRIP | Freq: Once | OPHTHALMIC | Status: AC
  Administered 2020-07-31: 1 via OPHTHALMIC

## 2020-07-31 MED ORDER — FLUORESCEIN SODIUM 1 MG OP STRP
1.0000 | ORAL_STRIP | Freq: Once | OPHTHALMIC | Status: DC
  Filled 2020-07-31: qty 1

## 2020-07-31 MED ORDER — AMOXICILLIN-POT CLAVULANATE 875-125 MG PO TABS: 1.0000 | ORAL_TABLET | Freq: Two times a day (BID) | ORAL | 0 refills | Status: DC

## 2020-07-31 MED ORDER — TETRACAINE HCL 0.5 % OP SOLN
1.0000 [drp] | Freq: Once | OPHTHALMIC | Status: AC
  Administered 2020-07-31: 1 [drp] via OPHTHALMIC

## 2020-07-31 NOTE — ED Notes (Signed)
Patient transported to CT 

## 2020-07-31 NOTE — ED Notes (Signed)
Patient verbalized understanding of discharge instructions. Opportunity for questions and answers.  

## 2020-07-31 NOTE — ED Notes (Signed)
Visual acuity R 20/25 L 20/20 Both 20/20 Glasses in place. Does not use contacts.

## 2020-07-31 NOTE — Discharge Instructions (Signed)
You were given a prescription for antibiotics. Please take the antibiotic prescription fully.   Please schedule an appointment for follow up with your ophthalmologist and neurologist in regards to your symptoms.   Please follow up with your primary care provider within 5-7 days for re-evaluation of your symptoms.   Please return to the emergency department for any new or worsening symptoms.

## 2020-07-31 NOTE — ED Triage Notes (Signed)
Pt here from home with c/o sinus pressure and blurred vision in her left eye for 1 year along with h/a

## 2020-07-31 NOTE — ED Provider Notes (Signed)
[facility] EMERGENCY DEPARTMENT Provider Note   CSN: [id number] Arrival date & time: 07/31/20  1413     History No chief complaint on file.   [name] is a 65 y.o. female.  HPI   Pt is a 65 y/o female with a h/o AAA, anxiety, cataract, chronic left shoulder pain, HA, HLD, HTN, prediabetes, schatzkis ring seasonal allergies, tremor, who presents to the ED today for eval of sinus congestion, sinus pressure that she states is chronic but has been worse for the last week. Reports she has been taking claritin, fluticasone without relief. Denies fever, cough. She has some mild right ear pain.  Further states she has had blurred vision in the left eye for 1 week. Reports FB sensation in the eye for 1 year. Reports mild pressure behind the left eye.  Past Medical History:  Diagnosis Date  . Abdominal aortic aneurysm (AAA) 3.0 cm to 5.0 cm in diameter in female [facility])    None seen on prior imaging?   Marland Kitchen Anxiety   . Cataract   . Chronic left shoulder pain   . Headache   . Hiatal hernia    small  . Hyperlipidemia   . Hypertension   . Pre-diabetes   . Schatzki's ring   . Seasonal allergies   . Tremor     Patient Active Problem List   Diagnosis Date Noted  . Hyperlipidemia   . Hypertension   . Abdominal aortic aneurysm (AAA) 3.0 cm to 5.0 cm in diameter in female [facility])   . Nausea without vomiting 12/06/2018  . Loss of weight 12/06/2018  . Controlled diabetes mellitus type 2 with complications ([place]) 27/10/2374  . Essential hypertension 04/25/2017  . Hyperlipidemia LDL goal <70 04/25/2017  . Carotid artery stenosis without cerebral infarction, bilateral 04/25/2017  . AAA (abdominal aortic aneurysm) without rupture ([place]) 04/25/2017  . History of colonic polyps   . Diverticulosis of colon without hemorrhage   . RUQ pain 02/21/2013  . Abdominal pain, epigastric 01/05/2013  . Abdominal bruit 01/05/2013  . Esophageal dysphagia 01/05/2013  . Constipation  01/05/2013  . GERD (gastroesophageal reflux disease) 01/05/2013    Past Surgical History:  Procedure Laterality Date  . CARDIAC CATHETERIZATION  2005   "Ms. Vidovich has essentially normal coronary arteries and normal left ventricular function.  She will be treated empirically with anti-reflux measures"  . CATARACT EXTRACTION, BILATERAL    . CHOLECYSTECTOMY    . COLONOSCOPY    06/16/2004   EGB:TDVVOH rectum/Diminutive polyps of the splenic flexure and the cecum/The remainder of the colonic mucosa appeared normal pathology (hyperplastic polyps).  . COLONOSCOPY N/A 08/07/2015   Dr. Gala Romney: 6 mm descending colon tubular adenoma, multiple medium-mouthed diverticula in sigmoid colon. surveillance 2022  . ESOPHAGOGASTRODUODENOSCOPY  09/11/2001   YWV:PXTGGYIRS ring otherwise normal esophagus/Normal stomach/Normal D1 and D2 status post passage of a 56 [place] dilator  . ESOPHAGOGASTRODUODENOSCOPY N/A 11/11/2017   Procedure: ESOPHAGOGASTRODUODENOSCOPY (EGD);  Surgeon: Daneil Dolin, MD; mild Schatzki ring s/p dilation and mild erosive gastropathy.  . ESOPHAGOGASTRODUODENOSCOPY (EGD) WITH ESOPHAGEAL DILATION N/A 01/25/2013   Dr. Gala Romney- schatzki's ring- 64 F dilation. small hiatal hernia  . HYSTEROSCOPY WITH D & C N/A 03/06/2019   Procedure: DILATATION AND CURETTAGE /HYSTEROSCOPY;  Surgeon: Jonnie Kind, MD;  Location: [facility] ORS;  Service: Gynecology;  Laterality: N/A;  . [name] DILATION N/A 11/11/2017   Procedure: [name] DILATION;  Surgeon: Daneil Dolin, MD;  Location: [facility] ENDO SUITE;  Service: Endoscopy;  Laterality: N/A;  . POLYPECTOMY N/A 03/06/2019   Procedure: POLYPECTOMY ( REMOVAL OF ENDOMETRIAL POLYP);  Surgeon: Jonnie Kind, MD;  Location: [facility] ORS;  Service: Gynecology;  Laterality: N/A;  . TUBAL LIGATION       OB History    Gravida  4   Para  3   Term  3   Preterm      AB  1   Living  3     SAB  1   IAB      Ectopic      Multiple      Live Births               Family History  Problem Relation Age of Onset  . Diabetes Other   . Diabetes Mother   . Heart disease Mother   . Heart disease Father   . Diabetes Sister   . Diabetes Brother   . Hypertension Brother   . Diabetes Daughter   . Hypertension Maternal Grandmother   . Stroke Maternal Grandfather   . Early death Paternal Grandfather   . Colon cancer Neg Hx   . Liver disease Neg Hx     Social History   Tobacco Use  . Smoking status: Former Smoker    Packs/day: 0.25    Years: 35.00    Pack years: 8.75    Types: Cigarettes    Quit date: 12/31/2012    Years since quitting: 7.5  . Smokeless tobacco: Never Used  Vaping Use  . Vaping Use: Never used  Substance Use Topics  . Alcohol use: Not Currently  . Drug use: No    Home Medications Prior to Admission medications   Medication Sig Start Date End Date Taking? Authorizing Provider  amoxicillin-clavulanate (AUGMENTIN) 875-125 MG tablet Take 1 tablet by mouth every 12 (twelve) hours. 07/31/20  Yes Jenet Durio S, PA-C  acetaminophen (TYLENOL) 500 MG tablet Take 500 mg by mouth every 6 (six) hours as needed for moderate pain or headache.    [provider]  ALPRAZolam Duanne Moron) 0.5 MG tablet TAKE 1 TABLET(0.5 MG) BY MOUTH TWICE DAILY AS NEEDED FOR ANXIETY 05/06/20   Susy Frizzle, MD  amLODipine (NORVASC) 10 MG tablet TAKE 1 TABLET(10 MG) BY MOUTH AT BEDTIME 12/12/19   Susy Frizzle, MD  aspirin EC 81 MG tablet Take 81 mg by mouth at bedtime.     [provider]  cholecalciferol (VITAMIN D3) 25 MCG (1000 UT) tablet Take 2,000 Units by mouth daily.    [provider]  Cyanocobalamin (B-12) 2500 MCG TABS Take 2,500 mcg by mouth daily.    [provider]  doxazosin (CARDURA) 2 MG tablet Take 2 mg by mouth daily.    [provider]  EPINEPHrine 0.3 mg/0.3 mL IJ SOAJ injection Inject 0.3 mg into the muscle as needed for anaphylaxis.  01/05/19   [provider]   escitalopram (LEXAPRO) 10 MG tablet Take 1 tablet (10 mg total) by mouth daily. 09/13/19   Susy Frizzle, MD  esomeprazole (NEXIUM) 40 MG capsule Take 1 capsule (40 mg total) by mouth 2 (two) times daily before a meal. 07/03/20   Erenest Rasher, PA-C  ezetimibe (ZETIA) 10 MG tablet Take 1 tablet (10 mg total) by mouth daily. 06/30/20   Susy Frizzle, MD  fluticasone (FLONASE) 50 MCG/ACT nasal spray SHAKE LIQUID AND USE 2 SPRAYS IN [facility] NOSTRIL DAILY 03/07/20   Susy Frizzle, MD  gentamicin ointment (GARAMYCIN)  0.1 % as needed. 06/02/20   [provider]  hydrochlorothiazide (HYDRODIURIL) 25 MG tablet Take 1 tablet (25 mg total) by mouth daily. 12/31/19   Susy Frizzle, MD  hydrOXYzine (ATARAX/VISTARIL) 25 MG tablet Take 0.5-1 tablets (12.5-25 mg total) by mouth every 8 (eight) hours as needed for itching. 12/31/18   Margarita Mail, PA-C  ipratropium (ATROVENT) 0.03 % nasal spray INSTILL 2 SPRAYS IN EACH NOSTRIL EVERY 12 HOURS 03/07/20   Susy Frizzle, MD  levocetirizine (XYZAL) 5 MG tablet SMARTSIG:1 Tablet(s) By Mouth Every Evening 12/20/19   [provider]  LINZESS 145 MCG CAPS capsule TAKE 1 CAPSULE(145 MCG) BY MOUTH DAILY BEFORE BREAKFAST 03/24/20   Susy Frizzle, MD  loratadine (CLARITIN) 10 MG tablet Take 10 mg by mouth daily as needed for allergies.    [provider]  MAGNESIUM PO Take 330 mg by mouth daily.    [provider]  metoprolol succinate (TOPROL-XL) 100 MG 24 hr tablet Take 1 tablet (100 mg total) by mouth daily. 05/29/20   Susy Frizzle, MD  mometasone (ELOCON) 0.1 % cream Apply 1 application topically daily as needed (for irritation).  01/05/19   [provider]  montelukast (SINGULAIR) 10 MG tablet TAKE 1 TABLET(10 MG) BY MOUTH AT BEDTIME Patient taking differently: Take 10 mg by mouth at bedtime. 08/13/19   Susy Frizzle, MD  ondansetron (ZOFRAN) 4 MG tablet Take 1 tablet (4 mg total) by mouth every 8  (eight) hours as needed for nausea or vomiting. 08/23/19   Erenest Rasher, PA-C  potassium chloride SA (KLOR-CON) 20 MEQ tablet TAKE 1 TABLET(20 MEQ) BY MOUTH DAILY 05/29/20   Susy Frizzle, MD  sodium chloride (OCEAN) 0.65 % SOLN nasal spray Place 1 spray into both nostrils as needed for congestion.    [provider]  tacrolimus (PROTOPIC) 0.1 % ointment Apply 1 application topically daily as needed (for irritation).  01/08/19   [provider]  triamcinolone cream (KENALOG) 0.1 % APPLY EXTERNALLY TO THE AFFECTED AREA TWICE DAILY Patient taking differently: Apply 1 application topically 2 (two) times daily as needed (Rash). 12/28/18   Susy Frizzle, MD  valsartan (DIOVAN) 320 MG tablet TAKE 1 TABLET BY MOUTH EVERY DAY 05/12/20   Susy Frizzle, MD  vitamin C (ASCORBIC ACID) 250 MG tablet Take 500 mg by mouth daily.    [provider]  vitamin E 400 UNIT capsule Take 400 Units by mouth daily.    [provider]  zinc gluconate 50 MG tablet Take 50 mg by mouth daily.    [provider]    Allergies    Ciprofloxacin, Latex, and Statins  Review of Systems   Review of Systems  Constitutional: Negative for chills and fever.  HENT: Positive for congestion, ear pain, sinus pressure and sinus pain. Negative for sore throat.   Eyes: Positive for visual disturbance.  Respiratory: Negative for cough and shortness of breath.   Cardiovascular: Negative for chest pain.  Gastrointestinal: Negative for abdominal pain, constipation, diarrhea, nausea and vomiting.  Genitourinary: Negative for dysuria and hematuria.  Musculoskeletal: Negative for back pain.  Skin: Negative for rash.  Neurological: Positive for light-headedness and headaches. Negative for dizziness, speech difficulty, weakness and numbness.  All other systems reviewed and are negative.   Physical Exam Updated Vital Signs BP 131/65   Pulse (!) 58   Temp 98.9 F (37.2 C)   Resp 18    SpO2 97%  Physical Exam Vitals and nursing note reviewed.  Constitutional:      General: She is not in acute distress.    Appearance: She is well-developed.  HENT:     Head: Normocephalic and atraumatic.  Eyes:     Extraocular Movements: Extraocular movements intact.     Conjunctiva/sclera: Conjunctivae normal.     Pupils: Pupils are equal, round, and reactive to light.     Comments: No nystagmus.  Fluorescein stain was completed and was without uptake.  Visual acuity was reassuring bilaterally.  There is no corneal abrasion noted and no foreign body noted.  IOP's were normal bilaterally at 13 mmHg.  Cardiovascular:     Rate and Rhythm: Normal rate and regular rhythm.     Heart sounds: Normal heart sounds. No murmur heard.   Pulmonary:     Effort: Pulmonary effort is normal. No respiratory distress.     Breath sounds: Normal breath sounds. No wheezing, rhonchi or rales.  Abdominal:     General: Bowel sounds are normal.     Palpations: Abdomen is soft.     Tenderness: There is no abdominal tenderness. There is no guarding or rebound.  Musculoskeletal:     Cervical back: Neck supple.  Skin:    General: Skin is warm and dry.  Neurological:     Mental Status: She is alert.     Comments: Mental Status:  Alert, thought content appropriate, able to give a coherent history. Speech fluent without evidence of aphasia. Able to follow 2 step commands without difficulty.  Cranial Nerves:  II:  Peripheral visual fields grossly normal, pupils equal, round, reactive to light III,IV, VI: ptosis not present, extra-ocular motions intact bilaterally  V,VII: smile symmetric, facial light touch sensation equal VIII: hearing grossly normal to voice  X: uvula elevates symmetrically  XI: bilateral shoulder shrug symmetric and strong XII: midline tongue extension without fassiculations Motor:  Normal tone. 5/5 strength of BUE and BLE major muscle groups including strong and equal grip strength  and dorsiflexion/plantar flexion Sensory: light touch normal in all extremities. Ambulatory with steady gait     ED Results / Procedures / Treatments   Labs (all labs ordered are listed, but only abnormal results are displayed) Labs Reviewed  BASIC METABOLIC PANEL - Abnormal; Notable for the following components:      Result Value   Potassium 3.3 (*)    BUN 7 (*)    All other components within normal limits  CBC WITH DIFFERENTIAL/PLATELET - Abnormal; Notable for the following components:   Lymphs Abs 5.3 (*)    All other components within normal limits    EKG None  Radiology CT Head Wo Contrast  Result Date: 07/31/2020 CLINICAL DATA:  Vision loss EXAM: CT HEAD WITHOUT CONTRAST TECHNIQUE: Contiguous axial images were obtained from the base of the skull through the vertex without intravenous contrast. COMPARISON:  May 11, 2020 FINDINGS: Brain: No evidence of acute territorial infarction, hemorrhage, hydrocephalus,extra-axial collection or mass lesion/mass effect. The ventricles are normal in size and contour. Low-attenuation changes in the deep white matter consistent with small vessel ischemia. Vascular: No hyperdense vessel or unexpected calcification. Skull: The skull is intact. No fracture or focal lesion identified. Sinuses/Orbits: The visualized paranasal sinuses are clear. There is a small amount of fluid seen within the left mastoid air cells. The orbits and globes intact. Other: None IMPRESSION: No acute intracranial abnormality. Findings consistent with mild chronic small vessel ischemia Left mastoid air cell effusion Electronically Signed  By: Prudencio Pair M.D.   On: 07/31/2020 20:52    Procedures Procedures   Medications Ordered in ED Medications  fluorescein ophthalmic strip 1 strip (1 strip Left Eye Not Given 07/31/20 2228)  tetracaine (PONTOCAINE) 0.5 % ophthalmic solution 1 drop (1 drop Left Eye Not Given 07/31/20 2229)  tetracaine (PONTOCAINE) 0.5 % ophthalmic  solution 1 drop (1 drop Left Eye Given 07/31/20 2046)  fluorescein ophthalmic strip 1 strip (1 strip Left Eye Given 07/31/20 2046)    ED Course  I have reviewed the triage vital signs and the nursing notes.  Pertinent labs & imaging results that were available during my care of the patient were reviewed by me and considered in my medical decision making (see chart for details).    MDM Rules/Calculators/A&P                          65 year old female here for evaluation of several complaints.  She mainly complaining of sinus pressure and nasal congestion.  The triage note alluded to the patient complaining of blurred vision and when asked about this she does states she said blurred vision in the left eye for some time and a foreign body sensation for about a year.  She does wear glasses.  CBC is unremarkable.  BMP shows mild hypokalemia is otherwise reassuring  CT of the head does not show any evidence for stroke or other acute abnormality at this time.  Fluorescein stain was completed and did not show any evidence of uptake.  Eye pressures were normal bilaterally.  Visual acuity was reassuring bilaterally.    Patient has a reassuring work-up today I doubt cva, temporal arteritis, angle-closure glaucoma or other emergent ocular or neurologic complaint. Feel that the patient is appropriate to follow-up with her neurologist and ophthalmologist in regards to her symptoms. I will start her on an antibiotic for sinusitis given the duration of her symptoms and lack of improvement with current medication regimen.   Have advised on plan for follow-up and strict return precautions.  She voices understanding of the plan and reasons to return.  All questions answered.  Patient stable for discharge.  Final Clinical Impression(s) / ED Diagnoses Final diagnoses:  Discomfort of left eye  Sinusitis, unspecified chronicity, unspecified location    Rx / DC Orders ED Discharge Orders         Ordered     amoxicillin-clavulanate (AUGMENTIN) 875-125 MG tablet  Every 12 hours        07/31/20 2233           Bishop Dublin 07/31/20 2234    Quintella Reichert, MD 07/31/20 2352

## 2020-08-01 ENCOUNTER — Telehealth (INDEPENDENT_AMBULATORY_CARE_PROVIDER_SITE_OTHER): Payer: Medicare Other | Admitting: Nurse Practitioner

## 2020-08-01 ENCOUNTER — Other Ambulatory Visit: Payer: Self-pay

## 2020-08-01 VITALS — BP 118/66

## 2020-08-01 DIAGNOSIS — I952 Hypotension due to drugs: Secondary | ICD-10-CM

## 2020-08-01 NOTE — Progress Notes (Signed)
Subjective:    Patient ID: [name], female    DOB: Apr 23, 1956, 65 y.o.   MRN: [id number]  HPI: [name] is a 65 y.o. female presenting for hypertension.   Chief Complaint  Patient presents with  . Hypertension    Pt is on several bp meds, bp has been in normal range which is causing her to miss doses of meds. Need better understanding as to how to continue on the meds. Bp drops causing light head. Using salty food to regulate bp. Stopped water pill ans clonodine    HYPERTENSION Currently taking amlodipine 10 mg daily, doxazosin 2 mg, metoprolol succinate 100 XL mg, and valsartan 320 mg daily.  She reports ever since she started the doxazosin, she has been feeling more off balance.  This morning, her blood pressure was 80/45.  She felt tired, dizziness, lightheaded, off balance.  Previously, she skipped a couple of days without taking doxazosin and BP has been ~118-120/62 Hypertension status: controlled  Satisfied with current treatment? no Duration of hypertension: chronic BP monitoring frequency:  multiples times per day BP medication side effects:  yes Medication compliance: excellent Aspirin: yes Recurrent headaches: no Visual changes: no Palpitations: no Dyspnea: no Chest pain: no Lower extremity edema: no Dizzy/lightheaded: no  Allergies  Allergen Reactions  . Ciprofloxacin Shortness Of Breath  . Latex Other (See Comments)    Burns skin  . Statins     Muscle aches    Outpatient Encounter Medications as of 08/01/2020  Medication Sig Note  . acetaminophen (TYLENOL) 500 MG tablet Take 500 mg by mouth every 6 (six) hours as needed for moderate pain or headache.   . ALPRAZolam (XANAX) 0.5 MG tablet TAKE 1 TABLET(0.5 MG) BY MOUTH TWICE DAILY AS NEEDED FOR ANXIETY   . amLODipine (NORVASC) 10 MG tablet TAKE 1 TABLET(10 MG) BY MOUTH AT BEDTIME   . aspirin EC 81 MG tablet Take 81 mg by mouth at bedtime.    . cholecalciferol (VITAMIN D3) 25 MCG (1000 UT)  tablet Take 2,000 Units by mouth daily.   . Cyanocobalamin (B-12) 2500 MCG TABS Take 2,500 mcg by mouth daily.   Marland Kitchen doxazosin (CARDURA) 2 MG tablet Take 2 mg by mouth daily.   Marland Kitchen EPINEPHrine 0.3 mg/0.3 mL IJ SOAJ injection Inject 0.3 mg into the muscle as needed for anaphylaxis.    Marland Kitchen esomeprazole (NEXIUM) 40 MG capsule Take 1 capsule (40 mg total) by mouth 2 (two) times daily before a meal.   . fluticasone (FLONASE) 50 MCG/ACT nasal spray SHAKE LIQUID AND USE 2 SPRAYS IN EACH NOSTRIL DAILY   . gentamicin ointment (GARAMYCIN) 0.1 % as needed.   . hydrOXYzine (ATARAX/VISTARIL) 25 MG tablet Take 0.5-1 tablets (12.5-25 mg total) by mouth every 8 (eight) hours as needed for itching.   Marland Kitchen ipratropium (ATROVENT) 0.03 % nasal spray INSTILL 2 SPRAYS IN EACH NOSTRIL EVERY 12 HOURS   . levocetirizine (XYZAL) 5 MG tablet SMARTSIG:1 Tablet(s) By Mouth Every Evening   . LINZESS 145 MCG CAPS capsule TAKE 1 CAPSULE(145 MCG) BY MOUTH DAILY BEFORE BREAKFAST   . loratadine (CLARITIN) 10 MG tablet Take 10 mg by mouth daily as needed for allergies.   Marland Kitchen MAGNESIUM PO Take 330 mg by mouth daily.   . metoprolol succinate (TOPROL-XL) 100 MG 24 hr tablet Take 1 tablet (100 mg total) by mouth daily.   . mometasone (ELOCON) 0.1 % cream Apply 1 application topically daily as needed (for irritation).    Marland Kitchen  montelukast (SINGULAIR) 10 MG tablet TAKE 1 TABLET(10 MG) BY MOUTH AT BEDTIME (Patient taking differently: Take 10 mg by mouth at bedtime.)   . ondansetron (ZOFRAN) 4 MG tablet Take 1 tablet (4 mg total) by mouth every 8 (eight) hours as needed for nausea or vomiting.   . potassium chloride SA (KLOR-CON) 20 MEQ tablet TAKE 1 TABLET(20 MEQ) BY MOUTH DAILY   . sodium chloride (OCEAN) 0.65 % SOLN nasal spray Place 1 spray into both nostrils as needed for congestion.   . tacrolimus (PROTOPIC) 0.1 % ointment Apply 1 application topically daily as needed (for irritation).    . triamcinolone cream (KENALOG) 0.1 % APPLY EXTERNALLY TO  THE AFFECTED AREA TWICE DAILY (Patient taking differently: Apply 1 application topically 2 (two) times daily as needed (Rash).)   . valsartan (DIOVAN) 320 MG tablet TAKE 1 TABLET BY MOUTH EVERY DAY   . vitamin C (ASCORBIC ACID) 250 MG tablet Take 500 mg by mouth daily.   . vitamin E 400 UNIT capsule Take 400 Units by mouth daily.   Marland Kitchen zinc gluconate 50 MG tablet Take 50 mg by mouth daily.   Marland Kitchen ezetimibe (ZETIA) 10 MG tablet Take 1 tablet (10 mg total) by mouth daily. (Patient not taking: Reported on 08/01/2020) 08/01/2020: NOT TAKING MAKE CRAMPING IN LEGS  . [DISCONTINUED] amoxicillin-clavulanate (AUGMENTIN) 875-125 MG tablet Take 1 tablet by mouth every 12 (twelve) hours. 08/01/2020: diarrhea  . [DISCONTINUED] escitalopram (LEXAPRO) 10 MG tablet Take 1 tablet (10 mg total) by mouth daily.   . [DISCONTINUED] hydrochlorothiazide (HYDRODIURIL) 25 MG tablet Take 1 tablet (25 mg total) by mouth daily. (Patient not taking: Reported on 08/01/2020)    No facility-administered encounter medications on file as of 08/01/2020.    Patient Active Problem List   Diagnosis Date Noted  . Hyperlipidemia   . Hypertension   . Abdominal aortic aneurysm (AAA) 3.0 cm to 5.0 cm in diameter in female [facility])   . Nausea without vomiting 12/06/2018  . Loss of weight 12/06/2018  . Controlled diabetes mellitus type 2 with complications ([place]) 16/02/9603  . Essential hypertension 04/25/2017  . Hyperlipidemia LDL goal <70 04/25/2017  . Carotid artery stenosis without cerebral infarction, bilateral 04/25/2017  . AAA (abdominal aortic aneurysm) without rupture ([place]) 04/25/2017  . History of colonic polyps   . Diverticulosis of colon without hemorrhage   . RUQ pain 02/21/2013  . Abdominal pain, epigastric 01/05/2013  . Abdominal bruit 01/05/2013  . Esophageal dysphagia 01/05/2013  . Constipation 01/05/2013  . GERD (gastroesophageal reflux disease) 01/05/2013    Past Medical History:  Diagnosis Date  . Abdominal aortic  aneurysm (AAA) 3.0 cm to 5.0 cm in diameter in female [facility])    None seen on prior imaging?   Marland Kitchen Anxiety   . Cataract   . Chronic left shoulder pain   . Headache   . Hiatal hernia    small  . Hyperlipidemia   . Hypertension   . Pre-diabetes   . Schatzki's ring   . Seasonal allergies   . Tremor     Relevant past medical, surgical, family and social history reviewed and updated as indicated. Interim medical history since our last visit reviewed.  Review of Systems Per HPI unless specifically indicated above     Objective:    BP 118/66   Wt Readings from Last 3 Encounters:  07/03/20 177 lb 3.2 oz (80.4 kg)  07/02/20 174 lb (78.9 kg)  06/30/20 173 lb (78.5 kg)    Physical  Exam Physical examination unable to be performed due to lack of equipment.  Patient talking in complete sentences during telemedicine visit.     Assessment & Plan:  1. Hypotension due to drugs It sounds like the Doxazosin is making her blood pressure drop too low at times.  She sounds very compliant with her blood pressure monitoring and medications.  After discussion, we decided to cut doxazosin in half to 1 mg.  She will continue to monitor her blood pressure at home and notify us if less than 100/60.   I am leery to stop any of her medications because it looks like previously, her blood pressure was uncontrollably high.  She will follow up with [place] as needed.   Follow up plan: Return if symptoms worsen or fail to improve.   This visit was completed via telephone due to the restrictions of the COVID-19 pandemic. All issues as above were discussed and addressed but no physical exam was performed. If it was felt that the patient should be evaluated in the office, they were directed there. The patient verbally consented to this visit. Patient was unable to complete an audio/visual visit due to Lack of equipment. . Location of the patient: home . Location of the provider: work . Those involved with this call:   . Provider: Noemi Chapel, DNP, FNP-C . CMA: Annabelle Harman, CMA . Front Desk/Registration: Santina Evans  . Time spent on call: 7 minutes on the phone discussing health concerns. 11 minutes total spent in review of patient's record and preparation of their chart.  I verified patient identity using two factors (patient name and date of birth). Patient consents verbally to being seen via telemedicine visit today.

## 2020-08-12 ENCOUNTER — Other Ambulatory Visit: Payer: Self-pay | Admitting: *Deleted

## 2020-08-12 DIAGNOSIS — I83811 Varicose veins of right lower extremities with pain: Secondary | ICD-10-CM

## 2020-08-18 ENCOUNTER — Encounter (HOSPITAL_COMMUNITY): Payer: Self-pay | Admitting: *Deleted

## 2020-08-18 ENCOUNTER — Telehealth: Payer: Self-pay | Admitting: Internal Medicine

## 2020-08-18 ENCOUNTER — Emergency Department (HOSPITAL_COMMUNITY)
Admission: EM | Admit: 2020-08-18 | Discharge: 2020-08-19 | Disposition: A | Discharge status: Home / Self Care | Payer: Medicare Other | Attending: Emergency Medicine | Admitting: Emergency Medicine

## 2020-08-18 ENCOUNTER — Other Ambulatory Visit: Payer: Self-pay | Admitting: Gastroenterology

## 2020-08-18 ENCOUNTER — Emergency Department (HOSPITAL_COMMUNITY): Payer: Medicare Other

## 2020-08-18 ENCOUNTER — Other Ambulatory Visit: Payer: Self-pay

## 2020-08-18 DIAGNOSIS — I10 Essential (primary) hypertension: Secondary | ICD-10-CM | POA: Insufficient documentation

## 2020-08-18 DIAGNOSIS — Z87891 Personal history of nicotine dependence: Secondary | ICD-10-CM | POA: Insufficient documentation

## 2020-08-18 DIAGNOSIS — R002 Palpitations: Secondary | ICD-10-CM | POA: Diagnosis not present

## 2020-08-18 DIAGNOSIS — R079 Chest pain, unspecified: Secondary | ICD-10-CM | POA: Diagnosis not present

## 2020-08-18 DIAGNOSIS — R0602 Shortness of breath: Secondary | ICD-10-CM | POA: Diagnosis not present

## 2020-08-18 DIAGNOSIS — Z79899 Other long term (current) drug therapy: Secondary | ICD-10-CM | POA: Insufficient documentation

## 2020-08-18 DIAGNOSIS — R778 Other specified abnormalities of plasma proteins: Secondary | ICD-10-CM | POA: Diagnosis not present

## 2020-08-18 DIAGNOSIS — R110 Nausea: Secondary | ICD-10-CM | POA: Insufficient documentation

## 2020-08-18 DIAGNOSIS — Z91040 Latex allergy status: Secondary | ICD-10-CM | POA: Diagnosis not present

## 2020-08-18 DIAGNOSIS — Z7982 Long term (current) use of aspirin: Secondary | ICD-10-CM | POA: Insufficient documentation

## 2020-08-18 DIAGNOSIS — K219 Gastro-esophageal reflux disease without esophagitis: Secondary | ICD-10-CM

## 2020-08-18 DIAGNOSIS — E119 Type 2 diabetes mellitus without complications: Secondary | ICD-10-CM | POA: Insufficient documentation

## 2020-08-18 DIAGNOSIS — R072 Precordial pain: Secondary | ICD-10-CM | POA: Insufficient documentation

## 2020-08-18 DIAGNOSIS — R7989 Other specified abnormal findings of blood chemistry: Secondary | ICD-10-CM | POA: Diagnosis not present

## 2020-08-18 LAB — TROPONIN I (HIGH SENSITIVITY)
Troponin I (High Sensitivity): 8 ng/L (ref <18)
Troponin I (High Sensitivity): 19 ng/L — ABNORMAL HIGH (ref <18)
Troponin I (High Sensitivity): 31 ng/L — ABNORMAL HIGH (ref <18)
Troponin I (High Sensitivity): 29 ng/L — ABNORMAL HIGH (ref <18)

## 2020-08-18 LAB — BASIC METABOLIC PANEL
Anion gap: 9 (ref 5–15)
BUN: 10 mg/dL (ref 8–23)
CO2: 26 mmol/L (ref 22–32)
Calcium: 9.1 mg/dL (ref 8.9–10.3)
Chloride: 101 mmol/L (ref 98–111)
Creatinine, Ser: 0.79 mg/dL (ref 0.44–1.00)
GFR, Estimated: >60 mL/min (ref >60)
Glucose, Bld: 157 mg/dL — ABNORMAL HIGH (ref 70–99)
Potassium: 3.3 mmol/L — ABNORMAL LOW (ref 3.5–5.1)
Sodium: 136 mmol/L (ref 135–145)

## 2020-08-18 LAB — D-DIMER, QUANTITATIVE: D-Dimer, Quant: 0.61 ug/mL-FEU — ABNORMAL HIGH (ref 0.00–0.50)

## 2020-08-18 LAB — CBC
HCT: 37.1 % (ref 36.0–46.0)
Hemoglobin: 12.3 g/dL (ref 12.0–15.0)
MCH: 30.2 pg (ref 26.0–34.0)
MCHC: 33.2 g/dL (ref 30.0–36.0)
MCV: 91.2 fL (ref 80.0–100.0)
Platelets: 248 10*3/uL (ref 150–400)
RBC: 4.07 MIL/uL (ref 3.87–5.11)
RDW: 12.6 % (ref 11.5–15.5)
WBC: 7.2 10*3/uL (ref 4.0–10.5)
nRBC: 0 % (ref 0.0–0.2)

## 2020-08-18 MED ORDER — OMEPRAZOLE 40 MG PO CPDR
40.0000 mg | DELAYED_RELEASE_CAPSULE | Freq: Two times a day (BID) | ORAL | 3 refills | Status: DC
Start: 2020-08-18 — End: 2021-03-11

## 2020-08-18 MED ORDER — SUCRALFATE 1 G PO TABS: 1.0000 g | ORAL_TABLET | Freq: Three times a day (TID) | ORAL | 1 refills | Status: DC

## 2020-08-18 NOTE — Discharge Instructions (Addendum)
All the results in the ER are normal, labs and imaging. We are not sure what is causing your symptoms.  We would like you to follow-up with a cardiologist to have your palpitations and chest discomfort further evaluated. Take baby aspirin daily until the assessment.  Please return to the ER if you have worsening chest pain, shortness of breath, pain radiating to your jaw, shoulder, or back, sweats or fainting. Otherwise see the Cardiologist or your primary care doctor as requested.

## 2020-08-18 NOTE — ED Triage Notes (Signed)
Chest pain with shortness of breath for 2 weeks

## 2020-08-18 NOTE — ED Provider Notes (Addendum)
[facility] EMERGENCY DEPARTMENT Provider Note   CSN: [id number] Arrival date & time: 08/18/20  1656     History No chief complaint on file.   [name] is a 65 y.o. female.  HPI  HPI: A 65 year old patient with a history of hypertension presents for evaluation of chest pain. Initial onset of pain was approximately 1-3 hours ago. The patient's chest pain is described as heaviness/pressure/tightness and is not worse with exertion. The patient's chest pain is middle- or left-sided, is not well-localized, is not sharp and does not radiate to the arms/jaw/neck. The patient does not complain of nausea and denies diaphoresis. The patient has no history of stroke, has no history of peripheral artery disease, has not smoked in the past 90 days, denies any history of treated diabetes, has no relevant family history of coronary artery disease (first degree relative at less than age 25), has no history of hypercholesterolemia and does not have an elevated BMI (>=30).    Patient comes in with intermittent chest pain and palpitations for the last few days.  Patient reports that she starts feeling like her heart is racing and has some chest discomfort.  She has associated nausea.  No history of CAD.  Positive history of AAA.  Patient is not an active smoker.  No recent stress test.  Pain is no specific evoking, aggravating or relieving factor.  Pain is not pleuritic.  Past Medical History:  Diagnosis Date  . Abdominal aortic aneurysm (AAA) 3.0 cm to 5.0 cm in diameter in female [facility])    None seen on prior imaging?   Marland Kitchen Anxiety   . Cataract   . Chronic left shoulder pain   . Headache   . Hiatal hernia    small  . Hyperlipidemia   . Hypertension   . Pre-diabetes   . Schatzki's ring   . Seasonal allergies   . Tremor     Patient Active Problem List   Diagnosis Date Noted  . Hyperlipidemia   . Hypertension   . Abdominal aortic aneurysm (AAA) 3.0 cm to 5.0 cm in diameter in female  [facility])   . Nausea without vomiting 12/06/2018  . Loss of weight 12/06/2018  . Controlled diabetes mellitus type 2 with complications ([place]) 85/63/1497  . Essential hypertension 04/25/2017  . Hyperlipidemia LDL goal <70 04/25/2017  . Carotid artery stenosis without cerebral infarction, bilateral 04/25/2017  . AAA (abdominal aortic aneurysm) without rupture ([place]) 04/25/2017  . History of colonic polyps   . Diverticulosis of colon without hemorrhage   . RUQ pain 02/21/2013  . Abdominal pain, epigastric 01/05/2013  . Abdominal bruit 01/05/2013  . Esophageal dysphagia 01/05/2013  . Constipation 01/05/2013  . GERD (gastroesophageal reflux disease) 01/05/2013    Past Surgical History:  Procedure Laterality Date  . CARDIAC CATHETERIZATION  2005   "Ms. Kozuch has essentially normal coronary arteries and normal left ventricular function.  She will be treated empirically with anti-reflux measures"  . CATARACT EXTRACTION, BILATERAL    . CHOLECYSTECTOMY    . COLONOSCOPY    06/16/2004   WYO:VZCHYI rectum/Diminutive polyps of the splenic flexure and the cecum/The remainder of the colonic mucosa appeared normal pathology (hyperplastic polyps).  . COLONOSCOPY N/A 08/07/2015   Dr. Gala Romney: 6 mm descending colon tubular adenoma, multiple medium-mouthed diverticula in sigmoid colon. surveillance 2022  . ESOPHAGOGASTRODUODENOSCOPY  09/11/2001   FOY:DXAJOINOM ring otherwise normal esophagus/Normal stomach/Normal D1 and D2 status post passage of a 56 [place] dilator  . ESOPHAGOGASTRODUODENOSCOPY N/A  11/11/2017   Procedure: ESOPHAGOGASTRODUODENOSCOPY (EGD);  Surgeon: Daneil Dolin, MD; mild Schatzki ring s/p dilation and mild erosive gastropathy.  . ESOPHAGOGASTRODUODENOSCOPY (EGD) WITH ESOPHAGEAL DILATION N/A 01/25/2013   Dr. Gala Romney- schatzki's ring- 74 F dilation. small hiatal hernia  . HYSTEROSCOPY WITH D & C N/A 03/06/2019   Procedure: DILATATION AND CURETTAGE /HYSTEROSCOPY;  Surgeon: Jonnie Kind, MD;  Location: [facility] ORS;  Service: Gynecology;  Laterality: N/A;  . [name] DILATION N/A 11/11/2017   Procedure: [name] DILATION;  Surgeon: Daneil Dolin, MD;  Location: [facility] ENDO SUITE;  Service: Endoscopy;  Laterality: N/A;  . POLYPECTOMY N/A 03/06/2019   Procedure: POLYPECTOMY ( REMOVAL OF ENDOMETRIAL POLYP);  Surgeon: Jonnie Kind, MD;  Location: [facility] ORS;  Service: Gynecology;  Laterality: N/A;  . TUBAL LIGATION       OB History    Gravida  4   Para  3   Term  3   Preterm      AB  1   Living  3     SAB  1   IAB      Ectopic      Multiple      Live Births              Family History  Problem Relation Age of Onset  . Diabetes Other   . Diabetes Mother   . Heart disease Mother   . Heart disease Father   . Diabetes Sister   . Diabetes Brother   . Hypertension Brother   . Diabetes Daughter   . Hypertension Maternal Grandmother   . Stroke Maternal Grandfather   . Early death Paternal Grandfather   . Colon cancer Neg Hx   . Liver disease Neg Hx     Social History   Tobacco Use  . Smoking status: Former Smoker    Packs/day: 0.25    Years: 35.00    Pack years: 8.75    Types: Cigarettes    Quit date: 12/31/2012    Years since quitting: 7.6  . Smokeless tobacco: Never Used  Vaping Use  . Vaping Use: Never used  Substance Use Topics  . Alcohol use: Not Currently  . Drug use: No    Home Medications Prior to Admission medications   Medication Sig Start Date End Date Taking? Authorizing Provider  ALPRAZolam (XANAX) 0.5 MG tablet TAKE 1 TABLET(0.5 MG) BY MOUTH TWICE DAILY AS NEEDED FOR ANXIETY Patient taking differently: Take 0.5 mg by mouth 2 (two) times daily as needed for anxiety. 05/06/20  Yes Susy Frizzle, MD  amLODipine (NORVASC) 10 MG tablet TAKE 1 TABLET(10 MG) BY MOUTH AT BEDTIME Patient taking differently: Take 10 mg by mouth daily. 12/12/19  Yes Susy Frizzle, MD  aspirin EC 81 MG tablet Take 81 mg by mouth at bedtime.    Yes  [provider]  cholecalciferol (VITAMIN D3) 25 MCG (1000 UT) tablet Take 2,000 Units by mouth daily.   Yes [provider]  Cyanocobalamin (B-12) 2500 MCG TABS Take 2,500 mcg by mouth daily.   Yes [provider]  doxazosin (CARDURA) 2 MG tablet Take 2 mg by mouth daily.   Yes [provider]  EPINEPHrine 0.3 mg/0.3 mL IJ SOAJ injection Inject 0.3 mg into the muscle as needed for anaphylaxis.  01/05/19  Yes [provider]  fluticasone (FLONASE) 50 MCG/ACT nasal spray SHAKE LIQUID AND USE 2 SPRAYS IN EACH NOSTRIL DAILY Patient taking differently: Place 2 sprays into both  nostrils daily. 03/07/20  Yes Susy Frizzle, MD  gentamicin ointment (GARAMYCIN) 0.1 % as needed. 06/02/20  Yes [provider]  hydrOXYzine (ATARAX/VISTARIL) 25 MG tablet Take 0.5-1 tablets (12.5-25 mg total) by mouth every 8 (eight) hours as needed for itching. 12/31/18  Yes Harris, Abigail, PA-C  levocetirizine (XYZAL) 5 MG tablet Take 5 mg by mouth every evening. 12/20/19  Yes [provider]  LINZESS 145 MCG CAPS capsule TAKE 1 CAPSULE(145 MCG) BY MOUTH DAILY BEFORE BREAKFAST Patient taking differently: Take 145 mcg by mouth daily before breakfast. 03/24/20  Yes Pickard, Cammie Mcgee, MD  loratadine (CLARITIN) 10 MG tablet Take 10 mg by mouth daily as needed for allergies.   Yes [provider]  MAGNESIUM PO Take 330 mg by mouth daily.   Yes [provider]  metoprolol succinate (TOPROL-XL) 100 MG 24 hr tablet Take 1 tablet (100 mg total) by mouth daily. 05/29/20  Yes Susy Frizzle, MD  mometasone (ELOCON) 0.1 % cream Apply 1 application topically daily as needed (for irritation).  01/05/19  Yes [provider]  montelukast (SINGULAIR) 10 MG tablet TAKE 1 TABLET(10 MG) BY MOUTH AT BEDTIME Patient taking differently: Take 10 mg by mouth at bedtime. 08/13/19  Yes Susy Frizzle, MD  omeprazole (PRILOSEC) 40 MG capsule Take 1 capsule (40 mg  total) by mouth 2 (two) times daily before a meal. 08/18/20  Yes Jodi Mourning, Kristen S, PA-C  ondansetron (ZOFRAN) 4 MG tablet Take 1 tablet (4 mg total) by mouth every 8 (eight) hours as needed for nausea or vomiting. 08/23/19  Yes Jodi Mourning, Kristen S, PA-C  potassium chloride SA (KLOR-CON) 20 MEQ tablet TAKE 1 TABLET(20 MEQ) BY MOUTH DAILY Patient taking differently: Take 20 mEq by mouth daily. 05/29/20  Yes Susy Frizzle, MD  sodium chloride (OCEAN) 0.65 % SOLN nasal spray Place 1 spray into both nostrils as needed for congestion.   Yes [provider]  tacrolimus (PROTOPIC) 0.1 % ointment Apply 1 application topically daily as needed (for irritation).  01/08/19  Yes [provider]  triamcinolone cream (KENALOG) 0.1 % APPLY EXTERNALLY TO THE AFFECTED AREA TWICE DAILY Patient taking differently: Apply 1 application topically 2 (two) times daily as needed (Rash). 12/28/18  Yes Susy Frizzle, MD  valsartan (DIOVAN) 320 MG tablet TAKE 1 TABLET BY MOUTH EVERY DAY 05/12/20  Yes Susy Frizzle, MD  vitamin C (ASCORBIC ACID) 250 MG tablet Take 500 mg by mouth daily.   Yes [provider]  vitamin E 400 UNIT capsule Take 400 Units by mouth daily.   Yes [provider]  zinc gluconate 50 MG tablet Take 50 mg by mouth daily.   Yes [provider]  acetaminophen (TYLENOL) 500 MG tablet Take 500 mg by mouth every 6 (six) hours as needed for moderate pain or headache.    [provider]  ezetimibe (ZETIA) 10 MG tablet Take 1 tablet (10 mg total) by mouth daily. Patient not taking: No sig reported 06/30/20   [name] T, MD  ipratropium (ATROVENT) 0.03 % nasal spray INSTILL 2 SPRAYS IN EACH NOSTRIL EVERY 12 HOURS Patient not taking: No sig reported 03/07/20   Susy Frizzle, MD  sucralfate (CARAFATE) 1 g tablet Take 1 tablet (1 g total) by mouth 4 (four) times daily -  with meals and at bedtime. Patient not taking: No sig reported 08/18/20 08/18/21   Erenest Rasher, PA-C    Allergies    Ciprofloxacin, Latex,  and Statins  Review of Systems   Review of Systems  Constitutional: Positive for activity change.  Cardiovascular: Positive for chest pain.  All other systems reviewed and are negative.   Physical Exam Updated Vital Signs BP 126/68   Pulse 62   Temp 98.3 F (36.8 C) (Oral)   Resp 15   Ht 5\' 7"  (1.702 m)   Wt 78 kg   SpO2 99%   BMI 26.94 kg/m   Physical Exam Vitals and nursing note reviewed.  Constitutional:      Appearance: She is well-developed.  HENT:     Head: Normocephalic and atraumatic.  Cardiovascular:     Rate and Rhythm: Normal rate.  Pulmonary:     Effort: Pulmonary effort is normal.  Abdominal:     General: Bowel sounds are normal.  Musculoskeletal:     Cervical back: Normal range of motion and neck supple.  Skin:    General: Skin is warm and dry.  Neurological:     Mental Status: She is alert and oriented to person, place, and time.     ED Results / Procedures / Treatments   Labs (all labs ordered are listed, but only abnormal results are displayed) Labs Reviewed  BASIC METABOLIC PANEL - Abnormal; Notable for the following components:      Result Value   Potassium 3.3 (*)    Glucose, Bld 157 (*)    All other components within normal limits  D-DIMER, QUANTITATIVE - Abnormal; Notable for the following components:   D-Dimer, Quant 0.61 (*)    All other components within normal limits  TROPONIN I (HIGH SENSITIVITY) - Abnormal; Notable for the following components:   Troponin I (High Sensitivity) 19 (*)    All other components within normal limits  TROPONIN I (HIGH SENSITIVITY) - Abnormal; Notable for the following components:   Troponin I (High Sensitivity) 31 (*)    All other components within normal limits  TROPONIN I (HIGH SENSITIVITY) - Abnormal; Notable for the following components:   Troponin I (High Sensitivity) 29 (*)    All other components within normal limits  CBC   TROPONIN I (HIGH SENSITIVITY)    EKG EKG Interpretation  Date/Time:  Monday August 18 2020 18:20:24 EDT Ventricular Rate:  77 PR Interval:  174 QRS Duration: 90 QT Interval:  426 QTC Calculation: 483 R Axis:   50 Text Interpretation: Sinus rhythm Consider left atrial enlargement Minimal ST elevation, inferior leads inferior lead ST elevation is new Confirmed by Varney Biles [phone]) on 08/18/2020 8:08:56 PM   Radiology DG Chest 2 View  Result Date: 08/18/2020 CLINICAL DATA:  Chest pain.  Intermittent shortness of breath. EXAM: CHEST - 2 VIEW COMPARISON:  Radiograph 10/16/2019.  Chest CT 02/17/2020 FINDINGS: The cardiomediastinal contours are normal. Atherosclerosis of the thoracic aorta. Pulmonary vasculature is normal. No consolidation, pleural effusion, or pneumothorax. No acute osseous abnormalities are seen. IMPRESSION: No acute chest findings. Aortic Atherosclerosis (ICD10-I70.0). Electronically Signed   By: Keith Rake M.D.   On: 08/18/2020 18:36    Procedures .Critical Care Performed by: Varney Biles, MD Authorized by: Varney Biles, MD   Critical care provider statement:    Critical care time (minutes):  32   Critical care was time spent personally by me on the following activities:  Discussions with consultants, evaluation of patient's response to treatment, examination of patient, ordering and performing treatments and interventions, ordering and review of laboratory studies, ordering and review of radiographic studies, pulse oximetry, re-evaluation of patient's condition,  obtaining history from patient or surrogate and review of old charts     Medications Ordered in ED Medications - No data to display  ED Course  I have reviewed the triage vital signs and the nursing notes.  Pertinent labs & imaging results that were available during my care of the patient were reviewed by me and considered in my medical decision making (see chart for details).    MDM  Rules/Calculators/A&P HEAR Score: 49                        65 year old female comes in a chief complaint of chest pain and palpitations.  She had an episode of palpitations earlier that was more severe along with chest discomfort.  The symptoms have been intermittently present for the last 2 weeks.  Hear score is 5.  She is a former smoker and has AAA.  Vascular exam is normal.   Currently pain-free.  We will get troponins and reassess.  11:59 PM Patient reassessed on 3 occasions. She remains chest pain-free.  She intermittently had some chest palpitations, but telemetry monitoring has not revealed any tachycardia dysrhythmias.  With her chest pain the hs-tn is 19--31--29. Her hear score is 5.  History is atypical.  Patient is reliable.  Plan is to discharge her with request for cardiology follow-up and have her return to the ER if her symptoms get worse.  This plan was discussed with the patient and she is in agreement with it. I have sent a message to Cards team to see her promptly.  Final Clinical Impression(s) / ED Diagnoses Final diagnoses:  Palpitations  Precordial chest pain  Elevated troponin    Rx / DC Orders ED Discharge Orders    None       Varney Biles, MD 08/18/20 2358    Varney Biles, MD 08/19/20 0006

## 2020-08-18 NOTE — ED Provider Notes (Incomplete)
[facility] EMERGENCY DEPARTMENT Provider Note   CSN: [id number] Arrival date & time: 08/18/20  1656     History No chief complaint on file.   [name] is a 65 y.o. female.  HPI  HPI: A 65 year old patient with a history of hypertension presents for evaluation of chest pain. Initial onset of pain was approximately 1-3 hours ago. The patient's chest pain is described as heaviness/pressure/tightness and is not worse with exertion. The patient's chest pain is middle- or left-sided, is not well-localized, is not sharp and does not radiate to the arms/jaw/neck. The patient does not complain of nausea and denies diaphoresis. The patient has no history of stroke, has no history of peripheral artery disease, has not smoked in the past 90 days, denies any history of treated diabetes, has no relevant family history of coronary artery disease (first degree relative at less than age 20), has no history of hypercholesterolemia and does not have an elevated BMI (>=30).    Patient comes in with intermittent chest pain and palpitations for the last few days.  Patient reports that she starts feeling like her heart is racing and has some chest discomfort.  She has associated nausea.  No history of CAD.  Positive history of AAA.  Patient is not an active smoker.  No recent stress test.  Pain is no specific evoking, aggravating or relieving factor.  Pain is not pleuritic.  Past Medical History:  Diagnosis Date  . Abdominal aortic aneurysm (AAA) 3.0 cm to 5.0 cm in diameter in female [facility])    None seen on prior imaging?   Marland Kitchen Anxiety   . Cataract   . Chronic left shoulder pain   . Headache   . Hiatal hernia    small  . Hyperlipidemia   . Hypertension   . Pre-diabetes   . Schatzki's ring   . Seasonal allergies   . Tremor     Patient Active Problem List   Diagnosis Date Noted  . Hyperlipidemia   . Hypertension   . Abdominal aortic aneurysm (AAA) 3.0 cm to 5.0 cm in diameter in female  [facility])   . Nausea without vomiting 12/06/2018  . Loss of weight 12/06/2018  . Controlled diabetes mellitus type 2 with complications ([place]) 34/91/7915  . Essential hypertension 04/25/2017  . Hyperlipidemia LDL goal <70 04/25/2017  . Carotid artery stenosis without cerebral infarction, bilateral 04/25/2017  . AAA (abdominal aortic aneurysm) without rupture ([place]) 04/25/2017  . History of colonic polyps   . Diverticulosis of colon without hemorrhage   . RUQ pain 02/21/2013  . Abdominal pain, epigastric 01/05/2013  . Abdominal bruit 01/05/2013  . Esophageal dysphagia 01/05/2013  . Constipation 01/05/2013  . GERD (gastroesophageal reflux disease) 01/05/2013    Past Surgical History:  Procedure Laterality Date  . CARDIAC CATHETERIZATION  2005   "Ms. Parlin has essentially normal coronary arteries and normal left ventricular function.  She will be treated empirically with anti-reflux measures"  . CATARACT EXTRACTION, BILATERAL    . CHOLECYSTECTOMY    . COLONOSCOPY    06/16/2004   AVW:PVXYIA rectum/Diminutive polyps of the splenic flexure and the cecum/The remainder of the colonic mucosa appeared normal pathology (hyperplastic polyps).  . COLONOSCOPY N/A 08/07/2015   Dr. Gala Romney: 6 mm descending colon tubular adenoma, multiple medium-mouthed diverticula in sigmoid colon. surveillance 2022  . ESOPHAGOGASTRODUODENOSCOPY  09/11/2001   XKP:VVZSMOLMB ring otherwise normal esophagus/Normal stomach/Normal D1 and D2 status post passage of a 56 [place] dilator  . ESOPHAGOGASTRODUODENOSCOPY N/A  11/11/2017   Procedure: ESOPHAGOGASTRODUODENOSCOPY (EGD);  Surgeon: Daneil Dolin, MD; mild Schatzki ring s/p dilation and mild erosive gastropathy.  . ESOPHAGOGASTRODUODENOSCOPY (EGD) WITH ESOPHAGEAL DILATION N/A 01/25/2013   Dr. Gala Romney- schatzki's ring- 58 F dilation. small hiatal hernia  . HYSTEROSCOPY WITH D & C N/A 03/06/2019   Procedure: DILATATION AND CURETTAGE /HYSTEROSCOPY;  Surgeon: Jonnie Kind, MD;  Location: [facility] ORS;  Service: Gynecology;  Laterality: N/A;  . [name] DILATION N/A 11/11/2017   Procedure: [name] DILATION;  Surgeon: Daneil Dolin, MD;  Location: [facility] ENDO SUITE;  Service: Endoscopy;  Laterality: N/A;  . POLYPECTOMY N/A 03/06/2019   Procedure: POLYPECTOMY ( REMOVAL OF ENDOMETRIAL POLYP);  Surgeon: Jonnie Kind, MD;  Location: [facility] ORS;  Service: Gynecology;  Laterality: N/A;  . TUBAL LIGATION       OB History    Gravida  4   Para  3   Term  3   Preterm      AB  1   Living  3     SAB  1   IAB      Ectopic      Multiple      Live Births              Family History  Problem Relation Age of Onset  . Diabetes Other   . Diabetes Mother   . Heart disease Mother   . Heart disease Father   . Diabetes Sister   . Diabetes Brother   . Hypertension Brother   . Diabetes Daughter   . Hypertension Maternal Grandmother   . Stroke Maternal Grandfather   . Early death Paternal Grandfather   . Colon cancer Neg Hx   . Liver disease Neg Hx     Social History   Tobacco Use  . Smoking status: Former Smoker    Packs/day: 0.25    Years: 35.00    Pack years: 8.75    Types: Cigarettes    Quit date: 12/31/2012    Years since quitting: 7.6  . Smokeless tobacco: Never Used  Vaping Use  . Vaping Use: Never used  Substance Use Topics  . Alcohol use: Not Currently  . Drug use: No    Home Medications Prior to Admission medications   Medication Sig Start Date End Date Taking? Authorizing Provider  ALPRAZolam (XANAX) 0.5 MG tablet TAKE 1 TABLET(0.5 MG) BY MOUTH TWICE DAILY AS NEEDED FOR ANXIETY Patient taking differently: Take 0.5 mg by mouth 2 (two) times daily as needed for anxiety. 05/06/20  Yes Susy Frizzle, MD  amLODipine (NORVASC) 10 MG tablet TAKE 1 TABLET(10 MG) BY MOUTH AT BEDTIME Patient taking differently: Take 10 mg by mouth daily. 12/12/19  Yes Susy Frizzle, MD  aspirin EC 81 MG tablet Take 81 mg by mouth at bedtime.    Yes  [provider]  cholecalciferol (VITAMIN D3) 25 MCG (1000 UT) tablet Take 2,000 Units by mouth daily.   Yes [provider]  Cyanocobalamin (B-12) 2500 MCG TABS Take 2,500 mcg by mouth daily.   Yes [provider]  doxazosin (CARDURA) 2 MG tablet Take 2 mg by mouth daily.   Yes [provider]  EPINEPHrine 0.3 mg/0.3 mL IJ SOAJ injection Inject 0.3 mg into the muscle as needed for anaphylaxis.  01/05/19  Yes [provider]  fluticasone (FLONASE) 50 MCG/ACT nasal spray SHAKE LIQUID AND USE 2 SPRAYS IN EACH NOSTRIL DAILY Patient taking differently: Place 2 sprays into both  nostrils daily. 03/07/20  Yes Susy Frizzle, MD  gentamicin ointment (GARAMYCIN) 0.1 % as needed. 06/02/20  Yes [provider]  hydrOXYzine (ATARAX/VISTARIL) 25 MG tablet Take 0.5-1 tablets (12.5-25 mg total) by mouth every 8 (eight) hours as needed for itching. 12/31/18  Yes Harris, Abigail, PA-C  levocetirizine (XYZAL) 5 MG tablet Take 5 mg by mouth every evening. 12/20/19  Yes [provider]  LINZESS 145 MCG CAPS capsule TAKE 1 CAPSULE(145 MCG) BY MOUTH DAILY BEFORE BREAKFAST Patient taking differently: Take 145 mcg by mouth daily before breakfast. 03/24/20  Yes Pickard, Cammie Mcgee, MD  loratadine (CLARITIN) 10 MG tablet Take 10 mg by mouth daily as needed for allergies.   Yes [provider]  MAGNESIUM PO Take 330 mg by mouth daily.   Yes [provider]  metoprolol succinate (TOPROL-XL) 100 MG 24 hr tablet Take 1 tablet (100 mg total) by mouth daily. 05/29/20  Yes Susy Frizzle, MD  mometasone (ELOCON) 0.1 % cream Apply 1 application topically daily as needed (for irritation).  01/05/19  Yes [provider]  montelukast (SINGULAIR) 10 MG tablet TAKE 1 TABLET(10 MG) BY MOUTH AT BEDTIME Patient taking differently: Take 10 mg by mouth at bedtime. 08/13/19  Yes Susy Frizzle, MD  omeprazole (PRILOSEC) 40 MG capsule Take 1 capsule (40 mg  total) by mouth 2 (two) times daily before a meal. 08/18/20  Yes Jodi Mourning, Kristen S, PA-C  ondansetron (ZOFRAN) 4 MG tablet Take 1 tablet (4 mg total) by mouth every 8 (eight) hours as needed for nausea or vomiting. 08/23/19  Yes Jodi Mourning, Kristen S, PA-C  potassium chloride SA (KLOR-CON) 20 MEQ tablet TAKE 1 TABLET(20 MEQ) BY MOUTH DAILY Patient taking differently: Take 20 mEq by mouth daily. 05/29/20  Yes Susy Frizzle, MD  sodium chloride (OCEAN) 0.65 % SOLN nasal spray Place 1 spray into both nostrils as needed for congestion.   Yes [provider]  tacrolimus (PROTOPIC) 0.1 % ointment Apply 1 application topically daily as needed (for irritation).  01/08/19  Yes [provider]  triamcinolone cream (KENALOG) 0.1 % APPLY EXTERNALLY TO THE AFFECTED AREA TWICE DAILY Patient taking differently: Apply 1 application topically 2 (two) times daily as needed (Rash). 12/28/18  Yes Susy Frizzle, MD  valsartan (DIOVAN) 320 MG tablet TAKE 1 TABLET BY MOUTH EVERY DAY 05/12/20  Yes Susy Frizzle, MD  vitamin C (ASCORBIC ACID) 250 MG tablet Take 500 mg by mouth daily.   Yes [provider]  vitamin E 400 UNIT capsule Take 400 Units by mouth daily.   Yes [provider]  zinc gluconate 50 MG tablet Take 50 mg by mouth daily.   Yes [provider]  acetaminophen (TYLENOL) 500 MG tablet Take 500 mg by mouth every 6 (six) hours as needed for moderate pain or headache.    [provider]  ezetimibe (ZETIA) 10 MG tablet Take 1 tablet (10 mg total) by mouth daily. Patient not taking: No sig reported 06/30/20   [name] T, MD  ipratropium (ATROVENT) 0.03 % nasal spray INSTILL 2 SPRAYS IN EACH NOSTRIL EVERY 12 HOURS Patient not taking: No sig reported 03/07/20   Susy Frizzle, MD  sucralfate (CARAFATE) 1 g tablet Take 1 tablet (1 g total) by mouth 4 (four) times daily -  with meals and at bedtime. Patient not taking: No sig reported 08/18/20 08/18/21   Erenest Rasher, PA-C    Allergies    Ciprofloxacin, Latex,  and Statins  Review of Systems   Review of Systems  Constitutional: Positive for activity change.  Cardiovascular: Positive for chest pain.  All other systems reviewed and are negative.   Physical Exam Updated Vital Signs BP 126/68   Pulse 62   Temp 98.3 F (36.8 C) (Oral)   Resp 15   Ht 5\' 7"  (1.702 m)   Wt 78 kg   SpO2 99%   BMI 26.94 kg/m   Physical Exam Vitals and nursing note reviewed.  Constitutional:      Appearance: She is well-developed.  HENT:     Head: Normocephalic and atraumatic.  Cardiovascular:     Rate and Rhythm: Normal rate.  Pulmonary:     Effort: Pulmonary effort is normal.  Abdominal:     General: Bowel sounds are normal.  Musculoskeletal:     Cervical back: Normal range of motion and neck supple.  Skin:    General: Skin is warm and dry.  Neurological:     Mental Status: She is alert and oriented to person, place, and time.     ED Results / Procedures / Treatments   Labs (all labs ordered are listed, but only abnormal results are displayed) Labs Reviewed  BASIC METABOLIC PANEL - Abnormal; Notable for the following components:      Result Value   Potassium 3.3 (*)    Glucose, Bld 157 (*)    All other components within normal limits  D-DIMER, QUANTITATIVE - Abnormal; Notable for the following components:   D-Dimer, Quant 0.61 (*)    All other components within normal limits  TROPONIN I (HIGH SENSITIVITY) - Abnormal; Notable for the following components:   Troponin I (High Sensitivity) 19 (*)    All other components within normal limits  TROPONIN I (HIGH SENSITIVITY) - Abnormal; Notable for the following components:   Troponin I (High Sensitivity) 31 (*)    All other components within normal limits  TROPONIN I (HIGH SENSITIVITY) - Abnormal; Notable for the following components:   Troponin I (High Sensitivity) 29 (*)    All other components within normal limits  CBC   TROPONIN I (HIGH SENSITIVITY)    EKG EKG Interpretation  Date/Time:  Monday August 18 2020 18:20:24 EDT Ventricular Rate:  77 PR Interval:  174 QRS Duration: 90 QT Interval:  426 QTC Calculation: 483 R Axis:   50 Text Interpretation: Sinus rhythm Consider left atrial enlargement Minimal ST elevation, inferior leads inferior lead ST elevation is new Confirmed by Varney Biles [phone]) on 08/18/2020 8:08:56 PM   Radiology DG Chest 2 View  Result Date: 08/18/2020 CLINICAL DATA:  Chest pain.  Intermittent shortness of breath. EXAM: CHEST - 2 VIEW COMPARISON:  Radiograph 10/16/2019.  Chest CT 02/17/2020 FINDINGS: The cardiomediastinal contours are normal. Atherosclerosis of the thoracic aorta. Pulmonary vasculature is normal. No consolidation, pleural effusion, or pneumothorax. No acute osseous abnormalities are seen. IMPRESSION: No acute chest findings. Aortic Atherosclerosis (ICD10-I70.0). Electronically Signed   By: Keith Rake M.D.   On: 08/18/2020 18:36    Procedures .Critical Care Performed by: Varney Biles, MD Authorized by: Varney Biles, MD   Critical care provider statement:    Critical care time (minutes):  32   Critical care was time spent personally by me on the following activities:  Discussions with consultants, evaluation of patient's response to treatment, examination of patient, ordering and performing treatments and interventions, ordering and review of laboratory studies, ordering and review of radiographic studies, pulse oximetry, re-evaluation of patient's condition,  obtaining history from patient or surrogate and review of old charts     Medications Ordered in ED Medications - No data to display  ED Course  I have reviewed the triage vital signs and the nursing notes.  Pertinent labs & imaging results that were available during my care of the patient were reviewed by me and considered in my medical decision making (see chart for details).    MDM  Rules/Calculators/A&P HEAR Score: 68                        65 year old female comes in a chief complaint of chest pain and palpitations.  She had an episode of palpitations earlier that was more severe along with chest discomfort.  The symptoms have been intermittently present for the last 2 weeks.  Hear score is 5.  She is a former smoker and has AAA.  Vascular exam is normal.   Currently pain-free.  We will get troponins and reassess.  11:59 PM Patient reassessed on 3 occasions. She remains chest pain-free.  She intermittently had some chest palpitations, but telemetry monitoring has not revealed any tachycardia dysrhythmias.  With her chest pain the hs-tn is 19--31--29. Her hear score is 5.  History is atypical.  Patient is reliable.  Plan is to discharge her with request for cardiology follow-up and have her return to the ER if her symptoms get worse.  This plan was discussed with the patient and she is in agreement with it.   Final Clinical Impression(s) / ED Diagnoses Final diagnoses:  Palpitations  Precordial chest pain  Elevated troponin    Rx / DC Orders ED Discharge Orders    None       Varney Biles, MD 08/18/20 2358

## 2020-08-18 NOTE — ED Triage Notes (Signed)
Emergency Medicine Provider Triage Evaluation Note  [name] , a 65 y.o. female  was evaluated in triage.  Pt complains of intermittent episodes of chest pain, rapid heartbeat and nausea.  Present for 2 weeks. symptoms associated with shortness of breath.  Became worse today while shopping.  No history of cardiovascular disease.  No cough, fever or chills.  Review of Systems  Positive: Chest pain, palpitations, shortness of breath, nausea Negative: Abdominal pain, vomiting, diaphoresis, syncope  Physical Exam  BP (!) 199/79   Pulse (!) 121   Temp 98.3 F (36.8 C) (Oral)   Resp (!) 28   Ht 5\' 7"  (1.702 m)   Wt 78 kg   SpO2 97%   BMI 26.94 kg/m  Gen:   Awake, patient is anxious appearing, tremulous HEENT:  Atraumatic  Resp:  Normal effort  Cardiac:  Tachycardia, normal rhythm Abd:   Nondistended, nontender  MSK:   Moves extremities without difficulty  Neuro:  Speech clear   Medical Decision Making  Medically screening exam initiated at 5:25 PM.  Appropriate orders placed.  MILIANA Williams was informed that the remainder of the evaluation will be completed by another provider, this initial triage assessment does not replace that evaluation, and the importance of remaining in the ED until their evaluation is complete.  Clinical Impression  Patient here with intermittent episodes of chest pain, shortness of breath, nausea and tachycardia.  History of AAA, no abdominal pain today.  She is tachycardic and appears tremulous.  Will need further ER work-up. R/o cardiac process vs PE   Kimberly Parkinson, PA-C 08/18/20 1728

## 2020-08-18 NOTE — Telephone Encounter (Signed)
[phone] patient said that her reflux medication is not working. Nexium. [name] that she is taking her protonix again.

## 2020-08-18 NOTE — Telephone Encounter (Signed)
Spoke with pt. Pt started Nexium twice daily before meals as directed at her last ov. Pt d/c Nexium last Wednesday 08/13/2020 due to pt the constant burning sensations in stomach, throat burning and pain in the back of her neck. Pt went back to taking Pantoprazole as she previously was on.since pt started back on the Pantoprazole, it has hasn't helped much. Pt continues to have the burning sensation with pain.

## 2020-08-18 NOTE — Telephone Encounter (Signed)
Spoke with patient. We will try her on omeprazole 40 mg BID and Carafate TID before meals and at bedtime. Requested progress report in 2 weeks.   Also advised she reach out to her PCP to discuss the occasional flutter in her chest that radiates to her neck as this may be cardiac related rather than GERD.

## 2020-08-19 ENCOUNTER — Encounter: Payer: Self-pay | Admitting: Family Medicine

## 2020-08-19 ENCOUNTER — Other Ambulatory Visit: Payer: Self-pay

## 2020-08-19 ENCOUNTER — Ambulatory Visit (INDEPENDENT_AMBULATORY_CARE_PROVIDER_SITE_OTHER): Payer: Medicare Other | Admitting: Family Medicine

## 2020-08-19 VITALS — BP 148/80 | HR 74 | Temp 98.0°F | Resp 14 | Ht 67.0 in | Wt 175.0 lb

## 2020-08-19 DIAGNOSIS — R002 Palpitations: Secondary | ICD-10-CM

## 2020-08-19 DIAGNOSIS — R778 Other specified abnormalities of plasma proteins: Secondary | ICD-10-CM | POA: Diagnosis not present

## 2020-08-19 NOTE — Progress Notes (Signed)
Subjective:    Patient ID: [name], female    DOB: 1956/04/16, 65 y.o.   MRN: [id number]  Recently, the patient states that she has been having episodes of heart palpitations.  She states that she will feel her heart start racing for no reason.  The symptoms became so bad Sunday that she went to the emergency room.  On initial evaluation in the emergency room, her heart rate was over 120 and her blood pressure was over 284 systolic.  EKG showed mild ST changes in the inferior leads.  Initial troponins were normal.  By the time she was seen in the emergency room however her blood pressure had calmed down to 132 systolic and her heart rate was in the 60s.  Her initial troponins ultimately peaked at just over 30 and then fell to 29 by the time of discharge.  Therefore there was a slight elevation in her troponins of questionable clinical significance.  I believe that this is due to potential strain from tachycardia.  She states that at the time she was feeling shortness of breath and may have had some slight discomfort in her chest prior to arrival to the emergency room however after arriving at the emergency room this subsided.  She states today that she feels normal. Past Medical History:  Diagnosis Date  . Abdominal aortic aneurysm (AAA) 3.0 cm to 5.0 cm in diameter in female [facility])    None seen on prior imaging?   Marland Kitchen Anxiety   . Cataract   . Chronic left shoulder pain   . Headache   . Hiatal hernia    small  . Hyperlipidemia   . Hypertension   . Pre-diabetes   . Schatzki's ring   . Seasonal allergies   . Tremor    Past Surgical History:  Procedure Laterality Date  . CARDIAC CATHETERIZATION  2005   "Ms. Fiddler has essentially normal coronary arteries and normal left ventricular function.  She will be treated empirically with anti-reflux measures"  . CATARACT EXTRACTION, BILATERAL    . CHOLECYSTECTOMY    . COLONOSCOPY    06/16/2004   GMW:NUUVOZ rectum/Diminutive polyps of  the splenic flexure and the cecum/The remainder of the colonic mucosa appeared normal pathology (hyperplastic polyps).  . COLONOSCOPY N/A 08/07/2015   Dr. Gala Romney: 6 mm descending colon tubular adenoma, multiple medium-mouthed diverticula in sigmoid colon. surveillance 2022  . ESOPHAGOGASTRODUODENOSCOPY  09/11/2001   DGU:[id number] ring otherwise normal esophagus/Normal stomach/Normal D1 and D2 status post passage of a 56 [place] dilator  . ESOPHAGOGASTRODUODENOSCOPY N/A 11/11/2017   Procedure: ESOPHAGOGASTRODUODENOSCOPY (EGD);  Surgeon: Daneil Dolin, MD; mild Schatzki ring s/p dilation and mild erosive gastropathy.  . ESOPHAGOGASTRODUODENOSCOPY (EGD) WITH ESOPHAGEAL DILATION N/A 01/25/2013   Dr. Gala Romney- schatzki's ring- 65 F dilation. small hiatal hernia  . HYSTEROSCOPY WITH D & C N/A 03/06/2019   Procedure: DILATATION AND CURETTAGE /HYSTEROSCOPY;  Surgeon: Jonnie Kind, MD;  Location: [facility];  Service: Gynecology;  Laterality: N/A;  . [name] DILATION N/A 11/11/2017   Procedure: [name] DILATION;  Surgeon: Daneil Dolin, MD;  Location: [facility] SUITE;  Service: Endoscopy;  Laterality: N/A;  . POLYPECTOMY N/A 03/06/2019   Procedure: POLYPECTOMY ( REMOVAL OF ENDOMETRIAL POLYP);  Surgeon: Jonnie Kind, MD;  Location: [facility];  Service: Gynecology;  Laterality: N/A;  . TUBAL LIGATION     Current Outpatient Medications on File Prior to Visit  Medication Sig Dispense Refill  . acetaminophen (TYLENOL) 500 MG tablet Take  500 mg by mouth every 6 (six) hours as needed for moderate pain or headache.    Marland Kitchen amLODipine (NORVASC) 10 MG tablet TAKE 1 TABLET(10 MG) BY MOUTH AT BEDTIME (Patient taking differently: Take 10 mg by mouth daily.) 90 tablet 3  . aspirin EC 81 MG tablet Take 81 mg by mouth at bedtime.     . cholecalciferol (VITAMIN D3) 25 MCG (1000 UT) tablet Take 2,000 Units by mouth daily.    . Cyanocobalamin (B-12) 2500 MCG TABS Take 2,500 mcg by mouth daily.    Marland Kitchen doxazosin (CARDURA) 2 MG  tablet Take 1 mg by mouth daily.    Marland Kitchen EPINEPHrine 0.3 mg/0.3 mL IJ SOAJ injection Inject 0.3 mg into the muscle as needed for anaphylaxis.     . fluticasone (FLONASE) 50 MCG/ACT nasal spray SHAKE LIQUID AND USE 2 SPRAYS IN EACH NOSTRIL DAILY (Patient taking differently: Place 2 sprays into both nostrils daily.) 16 g 6  . hydrOXYzine (ATARAX/VISTARIL) 25 MG tablet Take 0.5-1 tablets (12.5-25 mg total) by mouth every 8 (eight) hours as needed for itching. 20 tablet 0  . ipratropium (ATROVENT) 0.03 % nasal spray INSTILL 2 SPRAYS IN EACH NOSTRIL EVERY 12 HOURS 30 mL 12  . levocetirizine (XYZAL) 5 MG tablet Take 5 mg by mouth every evening.    Marland Kitchen LINZESS 145 MCG CAPS capsule TAKE 1 CAPSULE(145 MCG) BY MOUTH DAILY BEFORE BREAKFAST (Patient taking differently: Take 145 mcg by mouth daily before breakfast.) 30 capsule 3  . loratadine (CLARITIN) 10 MG tablet Take 10 mg by mouth daily as needed for allergies.    Marland Kitchen MAGNESIUM PO Take 330 mg by mouth daily.    . metoprolol succinate (TOPROL-XL) 100 MG 24 hr tablet Take 1 tablet (100 mg total) by mouth daily. 90 tablet 3  . mometasone (ELOCON) 0.1 % cream Apply 1 application topically daily as needed (for irritation).     . montelukast (SINGULAIR) 10 MG tablet TAKE 1 TABLET(10 MG) BY MOUTH AT BEDTIME (Patient taking differently: Take 10 mg by mouth at bedtime.) 30 tablet 3  . omeprazole (PRILOSEC) 40 MG capsule Take 1 capsule (40 mg total) by mouth 2 (two) times daily before a meal. 60 capsule 3  . ondansetron (ZOFRAN) 4 MG tablet Take 1 tablet (4 mg total) by mouth every 8 (eight) hours as needed for nausea or vomiting. 30 tablet 1  . potassium chloride SA (KLOR-CON) 20 MEQ tablet TAKE 1 TABLET(20 MEQ) BY MOUTH DAILY (Patient taking differently: Take 20 mEq by mouth daily.) 30 tablet 5  . sodium chloride (OCEAN) 0.65 % SOLN nasal spray Place 1 spray into both nostrils as needed for congestion.    . sucralfate (CARAFATE) 1 g tablet Take 1 tablet (1 g total) by  mouth 4 (four) times daily -  with meals and at bedtime. 120 tablet 1  . tacrolimus (PROTOPIC) 0.1 % ointment Apply 1 application topically daily as needed (for irritation).     . triamcinolone cream (KENALOG) 0.1 % APPLY EXTERNALLY TO THE AFFECTED AREA TWICE DAILY (Patient taking differently: Apply 1 application topically 2 (two) times daily as needed (Rash).) 45 g 0  . valsartan (DIOVAN) 320 MG tablet TAKE 1 TABLET BY MOUTH EVERY DAY 90 tablet 1  . vitamin C (ASCORBIC ACID) 250 MG tablet Take 500 mg by mouth daily.    . vitamin E 400 UNIT capsule Take 400 Units by mouth daily.    Marland Kitchen zinc gluconate 50 MG tablet Take 50 mg by mouth  daily.    Marland Kitchen ALPRAZolam (XANAX) 0.5 MG tablet TAKE 1 TABLET(0.5 MG) BY MOUTH TWICE DAILY AS NEEDED FOR ANXIETY (Patient not taking: Reported on 08/19/2020) 60 tablet 0   No current facility-administered medications on file prior to visit.   Allergies  Allergen Reactions  . Ciprofloxacin Shortness Of Breath  . Latex Other (See Comments)    Burns skin  . Statins     Muscle aches   Social History   Socioeconomic History  . Marital status: Single    Spouse name: Not on file  . Number of children: 3  . Years of education: Not on file  . Highest education level: Not on file  Occupational History  . Occupation: English as a second language teacher: [employer]  Tobacco Use  . Smoking status: Former Smoker    Packs/day: 0.25    Years: 35.00    Pack years: 8.75    Types: Cigarettes    Quit date: 12/31/2012    Years since quitting: 7.6  . Smokeless tobacco: Never Used  Vaping Use  . Vaping Use: Never used  Substance and Sexual Activity  . Alcohol use: Not Currently  . Drug use: No  . Sexual activity: Not Currently    Partners: Male    Birth control/protection: Post-menopausal, Surgical    Comment: tubal  Other Topics Concern  . Not on file  Social History Narrative   Eats all food groups.    Wears seatbelt.    Has 3 children.   Prior smoker.   Attends  church.    Used to work in [employer] fund[employer].    Drives.   Enjoys going out with family.    Social Determinants of Health   Financial Resource Strain: Low Risk   . Difficulty of Paying Living Expenses: Not very hard  Food Insecurity: Not on file  Transportation Needs: Not on file  Physical Activity: Not on file  Stress: Not on file  Social Connections: Not on file  Intimate Partner Violence: Not on file   Family History  Problem Relation Age of Onset  . Diabetes Other   . Diabetes Mother   . Heart disease Mother   . Heart disease Father   . Diabetes Sister   . Diabetes Brother   . Hypertension Brother   . Diabetes Daughter   . Hypertension Maternal Grandmother   . Stroke Maternal Grandfather   . Early death Paternal Grandfather   . Colon cancer Neg Hx   . Liver disease Neg Hx       Review of Systems  Neurological: Positive for headaches.  All other systems reviewed and are negative.      Objective:   Physical Exam Vitals reviewed.  Constitutional:      Appearance: She is well-developed.  Eyes:     General: No visual field deficit.    Extraocular Movements:     Right eye: Normal extraocular motion and no nystagmus.     Left eye: Normal extraocular motion and no nystagmus.     Pupils:     Right eye: Pupil is round and reactive.     Left eye: Pupil is round and reactive.  Cardiovascular:     Rate and Rhythm: Normal rate and regular rhythm.     Heart sounds: Normal heart sounds. No murmur heard. No friction rub. No gallop.   Pulmonary:     Effort: Pulmonary effort is normal. No respiratory distress.     Breath sounds: Normal breath sounds. No stridor. No  wheezing or rales.  Chest:     Chest wall: No tenderness.  Musculoskeletal:     Right hand: Normal. No swelling, deformity, tenderness or bony tenderness. Normal range of motion.     Left hand: Normal. No swelling, deformity, tenderness or bony tenderness. Normal range of motion.     Cervical back: Normal range of  motion. No rigidity.  Neurological:     Mental Status: She is alert and oriented to person, place, and time. Mental status is at baseline.     Cranial Nerves: No cranial nerve deficit, dysarthria or facial asymmetry.     Sensory: No sensory deficit.     Motor: Tremor present. No weakness or abnormal muscle tone.     Coordination: Romberg sign negative. Coordination normal.     Gait: Gait normal.           Assessment & Plan:  Palpitations - Plan: Ambulatory referral to Cardiology  Elevated troponin - Plan: Ambulatory referral to Cardiology  The high-sensitivity troponins were only mildly elevated.  However there was definitely a change having risen from normal to just over 30 on her second set and declining to 22 by her third.  I believe that she may have had strain secondary to prolonged tachycardia.  Therefore I believe she would benefit from a cardiology evaluation for a stress test to rule out underlying coronary artery disease as well as a cardiac monitor to rule out any cardiac arrhythmias.  She was monitored on telemetry for more than 8 hours in the emergency room and no arrhythmias were detected.  She did have hypokalemia with potassium of 3.3 and I will replete this by doubling her potassium for 5 days and then have her resume her baseline dose of 20 mEq a day.  Arrange follow-up with her cardiologist as soon as possible

## 2020-08-20 ENCOUNTER — Telehealth: Payer: Self-pay | Admitting: Cardiovascular Disease

## 2020-08-20 ENCOUNTER — Other Ambulatory Visit: Payer: Self-pay

## 2020-08-20 DIAGNOSIS — R079 Chest pain, unspecified: Secondary | ICD-10-CM

## 2020-08-20 NOTE — Telephone Encounter (Signed)
New message    Patient was having Palpitations and tightness in her chest was seen and treated in hospital. She is scheduled for surgery on Friday and wants to know if its ok if she proceeds with the surgery?

## 2020-08-20 NOTE — Progress Notes (Signed)
Lexiscan Myoview ordered per P. Johnsie Cancel, MD for nonspecific chest pain. Patient has surgery on 08/22/2020.

## 2020-08-20 NOTE — Telephone Encounter (Signed)
Spoke to Dr. Johnsie Cancel who ordered a Lexiscan Myoview to evaluate the patient before potentially having surgery. Spoke to patient who verbalized understanding and is aware that someone will call her to schedule test. Patient is having knee surgery on 08/22/2020 at [place]. Patient is also aware that she may have to reschedule her surgery due to availability of [place] appointments. Will forward to schedulers.

## 2020-08-21 ENCOUNTER — Telehealth: Payer: Self-pay | Admitting: Cardiovascular Disease

## 2020-08-21 NOTE — Telephone Encounter (Signed)
[name] -daughter called stating that patient was under the impression she is supposed to be having a CT Angio also tomorrow.  [phone]

## 2020-08-21 NOTE — Telephone Encounter (Signed)
Spoke with daughter who states that after speaking with PCP she was under the impression that we would be ordering at [facility]. She stated that she does not feel a stress test will show anything.

## 2020-08-22 ENCOUNTER — Encounter (HOSPITAL_COMMUNITY): Payer: Self-pay

## 2020-08-22 ENCOUNTER — Encounter (HOSPITAL_COMMUNITY)
Admission: RE | Admit: 2020-08-22 | Discharge: 2020-08-22 | Disposition: A | Discharge status: Home / Self Care | Payer: Medicare Other | Source: Ambulatory Visit | Attending: Cardiovascular Disease | Admitting: Cardiovascular Disease

## 2020-08-22 ENCOUNTER — Encounter (HOSPITAL_BASED_OUTPATIENT_CLINIC_OR_DEPARTMENT_OTHER)
Admission: RE | Admit: 2020-08-22 | Discharge: 2020-08-22 | Disposition: A | Discharge status: Home / Self Care | Payer: Medicare Other | Source: Ambulatory Visit | Attending: Cardiovascular Disease | Admitting: Cardiovascular Disease

## 2020-08-22 ENCOUNTER — Ambulatory Visit: Payer: Medicare Other | Admitting: Family Medicine

## 2020-08-22 DIAGNOSIS — R079 Chest pain, unspecified: Secondary | ICD-10-CM | POA: Diagnosis not present

## 2020-08-22 LAB — NM MYOCAR MULTI W/SPECT W/WALL MOTION / EF
LV dias vol: 90 mL (ref 46–106)
LV sys vol: 34 mL
Peak HR: 83 {beats}/min
RATE: 0.24
Rest HR: 54 {beats}/min
SDS: 0
SRS: 0
SSS: 0
TID: 0.97

## 2020-08-22 MED ORDER — TECHNETIUM TC 99M TETROFOSMIN IV KIT
10.0000 | PACK | Freq: Once | INTRAVENOUS | Status: AC | PRN
  Administered 2020-08-22: 11 via INTRAVENOUS

## 2020-08-22 MED ORDER — REGADENOSON 0.4 MG/5ML IV SOLN
INTRAVENOUS | Status: AC
  Administered 2020-08-22: 0.4 mg via INTRAVENOUS
  Filled 2020-08-22: qty 5

## 2020-08-22 MED ORDER — TECHNETIUM TC 99M TETROFOSMIN IV KIT
30.0000 | PACK | Freq: Once | INTRAVENOUS | Status: AC | PRN
  Administered 2020-08-22: 32.4 via INTRAVENOUS

## 2020-08-22 MED ORDER — SODIUM CHLORIDE FLUSH 0.9 % IV SOLN
INTRAVENOUS | Status: AC
  Administered 2020-08-22: 10 mL via INTRAVENOUS
  Filled 2020-08-22: qty 10

## 2020-08-22 NOTE — Telephone Encounter (Signed)
Patient is currently having nuclear stress test.

## 2020-08-25 NOTE — Telephone Encounter (Signed)
Daughter notified of Dr. Kyla Balzarine notes and voiced understanding.

## 2020-08-26 ENCOUNTER — Telehealth: Payer: Self-pay | Admitting: Cardiovascular Disease

## 2020-08-26 ENCOUNTER — Telehealth: Payer: Self-pay | Admitting: Family Medicine

## 2020-08-26 NOTE — Telephone Encounter (Signed)
Eye exam received and abstracted.

## 2020-08-26 NOTE — Telephone Encounter (Signed)
   [facility] Pre-operative Risk Assessment    Patient Name: [name]   [facility] STAFF: - Please ensure there is not already an duplicate clearance open for this procedure. - Under Visit Info/Reason for Call, type in Other and utilize the format Clearance MM/DD/YY or Clearance TBD. Do not use dashes or single digits. - If request is for dental extraction, please clarify the # of teeth to be extracted.  Request for surgical clearance:  1. What type of surgery is being performed? Right knee arthroscopy   2. When is this surgery scheduled? 09/12/2020  3. What type of clearance is required (medical clearance vs. Pharmacy clearance to hold med vs. Both)? Both  4. Are there any medications that need to be held prior to surgery and how long? Baby Asprin, held 7 days   5. Practice name and name of physician performing surgery? [facility], Dr. Frederik Pear  6. What is the office phone number? [phone]   7.   What is the office fax number? [phone]  8.   Anesthesia type (None, local, MAC, general) ? MAC with a block   Selena Zobro 08/26/2020, 9:08 AM  _________________________________________________________________   (provider comments below)

## 2020-08-26 NOTE — Telephone Encounter (Signed)
Received call from [place] from [facility]; duplicate request received for copy of patient's medical record from last diabetic exam. Fax already sent 4/14. Please advise Ivin Booty if fax not received at [phone], ext [phone].

## 2020-08-26 NOTE — Telephone Encounter (Signed)
    [name] DOB:  Jan 31, 1956  MRN:  [id number]   Primary Cardiologist: Jenkins Rouge, MD  Chart reviewed as part of pre-operative protocol coverage. Given past medical history and time since last visit, based on ACC/AHA guidelines, [name] would be at acceptable risk for the planned procedure without further cardiovascular testing.  Recent Myoview was normal.  The patient was advised that if she develops new symptoms prior to surgery to contact our office to arrange for a follow-up visit, and she verbalized understanding.  Patient may hold aspirin for 7 days prior to the procedure at restart as soon as possible afterward at the surgeon's discretion.  I will route this recommendation to the requesting party via Epic fax function and remove from pre-op pool.  Please call with questions.  [place], [place] 08/26/2020, 9:46 AM

## 2020-09-12 DIAGNOSIS — M94262 Chondromalacia, left knee: Secondary | ICD-10-CM | POA: Diagnosis not present

## 2020-09-12 DIAGNOSIS — M23322 Other meniscus derangements, posterior horn of medial meniscus, left knee: Secondary | ICD-10-CM | POA: Diagnosis not present

## 2020-09-12 DIAGNOSIS — M23352 Other meniscus derangements, posterior horn of lateral meniscus, left knee: Secondary | ICD-10-CM | POA: Diagnosis not present

## 2020-09-12 DIAGNOSIS — S83282A Other tear of lateral meniscus, current injury, left knee, initial encounter: Secondary | ICD-10-CM | POA: Diagnosis not present

## 2020-09-12 DIAGNOSIS — M2341 Loose body in knee, right knee: Secondary | ICD-10-CM | POA: Diagnosis not present

## 2020-09-12 DIAGNOSIS — G8918 Other acute postprocedural pain: Secondary | ICD-10-CM | POA: Diagnosis not present

## 2020-09-12 DIAGNOSIS — S83242A Other tear of medial meniscus, current injury, left knee, initial encounter: Secondary | ICD-10-CM | POA: Diagnosis not present

## 2020-09-13 ENCOUNTER — Other Ambulatory Visit: Payer: Self-pay | Admitting: Family Medicine

## 2020-09-13 DIAGNOSIS — I10 Essential (primary) hypertension: Secondary | ICD-10-CM

## 2020-09-16 ENCOUNTER — Other Ambulatory Visit: Payer: Self-pay | Admitting: Gastroenterology

## 2020-09-16 DIAGNOSIS — K219 Gastro-esophageal reflux disease without esophagitis: Secondary | ICD-10-CM

## 2020-09-30 ENCOUNTER — Ambulatory Visit (HOSPITAL_COMMUNITY): Payer: Medicare Other

## 2020-10-01 NOTE — Progress Notes (Deleted)
Referring Provider: Susy Frizzle, MD Primary Care Physician:  Susy Frizzle, MD Primary GI Physician: Dr. Gala Romney No chief complaint on file.   HPI:   [name] is a 65 y.o. female with a history of GERD, nausea, intermittent epigastric pain, dysphagia, and constipation.  EGD in 2019 with mild Schatzki's ring s/p dilation and mild erosive gastropathy, no specimens collected.  H. pylori breath test negative in April 2020.  Delayed gastric emptying documented in 2004 but normal GES in April 2021.  RUQ ultrasound in April 2021 with no significant findings, history of cholecystectomy.  Colonoscopy in March 2017 with 6 mm ascending colon tubular adenoma, multiple diverticula in the sigmoid colon, was due for surveillance March 2022.  She is presenting today for follow-up.  Last seen in our office 07/03/2020.  GERD well controlled on Protonix twice daily, sleeping elevated and eating early in the evening.  Occasional nausea without vomiting but she queried whether it was coming from her vitamins.  Nausea comes and goes within a few minutes and is not triggered by meals.  Has Zofran to take if needed, but does not usually require this.  Occasional epigastric twinges without classic loss and is gone.  Random.  None for 2 months and then occurs without trigger.  Not affected by meals or bowel movements.  This is chronic and unchanged.  Denies NSAIDs.  She was taking Linzess 145 mcg daily for constipation which was helping, but she continued to have small bowel movements that were on the harder side with incomplete emptying.  Query whether intermittent nausea was secondary to atypical GERD and possibly influenced by uncontrolled constipation.  Plan to change PPI to Nexium 40 mg twice daily and increase Linzess to 290 mcg daily.  Patient called 08/18/2020 stating Nexium was not working for her and she had resumed Protonix, but she continued with constant burning sensation in her stomach and  throat.  Plan to try omeprazole 40 mg twice daily and Carafate 3 times daily before meals and at bedtime.  Requested progress report in 2 weeks.  Also advised patient to reach out to her primary care provider to discuss the occasional fluttering in her chest that radiated to her neck as this may be cardiac related rather than GERD.  She was in the emergency room 08/18/2020 for palpitations, precordial chest pain, and elevated troponin.  Though her troponin was elevated, she was chest pain-free.  She was monitored on telemetry with no significant tachycardia dysrhythmias.  She underwent outpatient nuclear medicine stress test 4/15 that was unrevealing.  Today:  GERD:   Constipation:   Hx adenomatous colon polyps:    May need to consider Reglan.  Underlying gastroparesis may be influencing refractory reflux.  Past Medical History:  Diagnosis Date  . Abdominal aortic aneurysm (AAA) 3.0 cm to 5.0 cm in diameter in female [facility])    None seen on prior imaging?   Marland Kitchen Anxiety   . Cataract   . Chronic left shoulder pain   . Headache   . Hiatal hernia    small  . Hyperlipidemia   . Hypertension   . Pre-diabetes   . Schatzki's ring   . Seasonal allergies   . Tremor     Past Surgical History:  Procedure Laterality Date  . CARDIAC CATHETERIZATION  2005   "Ms. Havlik has essentially normal coronary arteries and normal left ventricular function.  She will be treated empirically with anti-reflux measures"  . CATARACT EXTRACTION, BILATERAL    .  CHOLECYSTECTOMY    . COLONOSCOPY    06/16/2004   MGQ:[id number] rectum/Diminutive polyps of the splenic flexure and the cecum/The remainder of the colonic mucosa appeared normal pathology (hyperplastic polyps).  . COLONOSCOPY N/A 08/07/2015   Dr. Gala Romney: 6 mm descending colon tubular adenoma, multiple medium-mouthed diverticula in sigmoid colon. surveillance 2022  . ESOPHAGOGASTRODUODENOSCOPY  09/11/2001   KDT:[id number] ring otherwise normal  esophagus/Normal stomach/Normal D1 and D2 status post passage of a 56 [place] Maloney dilator  . ESOPHAGOGASTRODUODENOSCOPY N/A 11/11/2017   Procedure: ESOPHAGOGASTRODUODENOSCOPY (EGD);  Surgeon: Daneil Dolin, MD; mild Schatzki ring s/p dilation and mild erosive gastropathy.  . ESOPHAGOGASTRODUODENOSCOPY (EGD) WITH ESOPHAGEAL DILATION N/A 01/25/2013   Dr. Gala Romney- schatzki's ring- 10 F dilation. small hiatal hernia  . HYSTEROSCOPY WITH D & C N/A 03/06/2019   Procedure: DILATATION AND CURETTAGE /HYSTEROSCOPY;  Surgeon: Jonnie Kind, MD;  Location: [facility];  Service: Gynecology;  Laterality: N/A;  . [name] DILATION N/A 11/11/2017   Procedure: [name] DILATION;  Surgeon: Daneil Dolin, MD;  Location: [facility];  Service: Endoscopy;  Laterality: N/A;  . POLYPECTOMY N/A 03/06/2019   Procedure: POLYPECTOMY ( REMOVAL OF ENDOMETRIAL POLYP);  Surgeon: Jonnie Kind, MD;  Location: [facility] OR[facility];  Service: Gynecology;  Laterality: N/A;  . TUBAL LIGATION      Current Outpatient Medications  Medication Sig Dispense Refill  . amLODipine (NORVASC) 10 MG tablet TAKE 1 TABLET(10 MG) BY MOUTH AT BEDTIME 90 tablet 3  . rosuvastatin (CRESTOR) 5 MG tablet TAKE 1 TABLET(5 MG) BY MOUTH DAILY 90 tablet 3  . acetaminophen (TYLENOL) 500 MG tablet Take 500 mg by mouth every 6 (six) hours as needed for moderate pain or headache.    . ALPRAZolam (XANAX) 0.5 MG tablet TAKE 1 TABLET(0.5 MG) BY MOUTH TWICE DAILY AS NEEDED FOR ANXIETY (Patient not taking: Reported on 08/19/2020) 60 tablet 0  . aspirin EC 81 MG tablet Take 81 mg by mouth at bedtime.     . cholecalciferol (VITAMIN D3) 25 MCG (1000 UT) tablet Take 2,000 Units by mouth daily.    . Cyanocobalamin (B-12) 2500 MCG TABS Take 2,500 mcg by mouth daily.    Marland Kitchen doxazosin (CARDURA) 2 MG tablet Take 1 mg by mouth daily.    Marland Kitchen EPINEPHrine 0.3 mg/0.3 mL IJ SOAJ injection Inject 0.3 mg into the muscle as needed for anaphylaxis.     . fluticasone (FLONASE) 50 MCG/ACT nasal  spray SHAKE LIQUID AND USE 2 SPRAYS IN EACH NOSTRIL DAILY (Patient taking differently: Place 2 sprays into both nostrils daily.) 16 g 6  . hydrOXYzine (ATARAX/VISTARIL) 25 MG tablet Take 0.5-1 tablets (12.5-25 mg total) by mouth every 8 (eight) hours as needed for itching. 20 tablet 0  . ipratropium (ATROVENT) 0.03 % nasal spray INSTILL 2 SPRAYS IN EACH NOSTRIL EVERY 12 HOURS 30 mL 12  . levocetirizine (XYZAL) 5 MG tablet Take 5 mg by mouth every evening.    Marland Kitchen LINZESS 145 MCG CAPS capsule TAKE 1 CAPSULE(145 MCG) BY MOUTH DAILY BEFORE BREAKFAST (Patient taking differently: Take 145 mcg by mouth daily before breakfast.) 30 capsule 3  . loratadine (CLARITIN) 10 MG tablet Take 10 mg by mouth daily as needed for allergies.    Marland Kitchen MAGNESIUM PO Take 330 mg by mouth daily.    . metoprolol succinate (TOPROL-XL) 100 MG 24 hr tablet Take 1 tablet (100 mg total) by mouth daily. 90 tablet 3  . mometasone (ELOCON) 0.1 % cream Apply 1 application topically daily as needed (  for irritation).     . montelukast (SINGULAIR) 10 MG tablet TAKE 1 TABLET(10 MG) BY MOUTH AT BEDTIME (Patient taking differently: Take 10 mg by mouth at bedtime.) 30 tablet 3  . omeprazole (PRILOSEC) 40 MG capsule Take 1 capsule (40 mg total) by mouth 2 (two) times daily before a meal. 60 capsule 3  . ondansetron (ZOFRAN) 4 MG tablet Take 1 tablet (4 mg total) by mouth every 8 (eight) hours as needed for nausea or vomiting. 30 tablet 1  . potassium chloride SA (KLOR-CON) 20 MEQ tablet TAKE 1 TABLET(20 MEQ) BY MOUTH DAILY (Patient taking differently: Take 20 mEq by mouth daily.) 30 tablet 5  . sodium chloride (OCEAN) 0.65 % SOLN nasal spray Place 1 spray into both nostrils as needed for congestion.    . sucralfate (CARAFATE) 1 g tablet TAKE 1 TABLET(1 GRAM) BY MOUTH FOUR TIMES DAILY WITH MEALS AND AT BEDTIME 120 tablet 0  . tacrolimus (PROTOPIC) 0.1 % ointment Apply 1 application topically daily as needed (for irritation).     . triamcinolone cream  (KENALOG) 0.1 % APPLY EXTERNALLY TO THE AFFECTED AREA TWICE DAILY (Patient taking differently: Apply 1 application topically 2 (two) times daily as needed (Rash).) 45 g 0  . valsartan (DIOVAN) 320 MG tablet TAKE 1 TABLET BY MOUTH EVERY DAY 90 tablet 1  . vitamin C (ASCORBIC ACID) 250 MG tablet Take 500 mg by mouth daily.    . vitamin E 400 UNIT capsule Take 400 Units by mouth daily.    Marland Kitchen zinc gluconate 50 MG tablet Take 50 mg by mouth daily.     No current facility-administered medications for this visit.    Allergies as of 10/02/2020 - Review Complete 08/22/2020  Allergen Reaction Noted  . Ciprofloxacin Shortness Of Breath 12/27/2011  . Latex Other (See Comments) 10/17/2019  . Statins  06/30/2020    Family History  Problem Relation Age of Onset  . Diabetes Other   . Diabetes Mother   . Heart disease Mother   . Heart disease Father   . Diabetes Sister   . Diabetes Brother   . Hypertension Brother   . Diabetes Daughter   . Hypertension Maternal Grandmother   . Stroke Maternal Grandfather   . Early death Paternal Grandfather   . Colon cancer Neg Hx   . Liver disease Neg Hx     Social History   Socioeconomic History  . Marital status: Single    Spouse name: Not on file  . Number of children: 3  . Years of education: Not on file  . Highest education level: Not on file  Occupational History  . Occupation: English as a second language teacher: [employer]  Tobacco Use  . Smoking status: Former Smoker    Packs/day: 0.25    Years: 35.00    Pack years: 8.75    Types: Cigarettes    Quit date: 12/31/2012    Years since quitting: 7.7  . Smokeless tobacco: Never Used  Vaping Use  . Vaping Use: Never used  Substance and Sexual Activity  . Alcohol use: Not Currently  . Drug use: No  . Sexual activity: Not Currently    Partners: Male    Birth control/protection: Post-menopausal, Surgical    Comment: tubal  Other Topics Concern  . Not on file  Social History Narrative   Eats  all food groups.    Wears seatbelt.    Has 3 children.   Prior smoker.   Attends church.  Used to work in [employer].    Drives.   Enjoys going out with family.    Social Determinants of Health   Financial Resource Strain: Not on file  Food Insecurity: Not on file  Transportation Needs: Not on file  Physical Activity: Not on file  Stress: Not on file  Social Connections: Not on file    Review of Systems: Gen: Denies fever, chills, anorexia. Denies fatigue, weakness, weight loss.  CV: Denies chest pain, palpitations, syncope, peripheral edema, and claudication. Resp: Denies dyspnea at rest, cough, wheezing, coughing up blood, and pleurisy. GI: Denies vomiting blood, jaundice, and fecal incontinence.   Denies dysphagia or odynophagia. Derm: Denies rash, itching, dry skin Psych: Denies depression, anxiety, memory loss, confusion. No homicidal or suicidal ideation.  Heme: Denies bruising, bleeding, and enlarged lymph nodes.  Physical Exam: There were no vitals taken for this visit. General:   Alert and oriented. No distress noted. Pleasant and cooperative.  Head:  Normocephalic and atraumatic. Eyes:  Conjuctiva clear without scleral icterus. Mouth:  Oral mucosa pink and moist. Good dentition. No lesions. Heart:  S1, S2 present without murmurs appreciated. Lungs:  Clear to auscultation bilaterally. No wheezes, rales, or rhonchi. No distress.  Abdomen:  +BS, soft, non-tender and non-distended. No rebound or guarding. No HSM or masses noted. Msk:  Symmetrical without gross deformities. Normal posture. Extremities:  Without edema. Neurologic:  Alert and  oriented x4 Psych:  Alert and cooperative. Normal mood and affect.

## 2020-10-02 ENCOUNTER — Ambulatory Visit: Payer: Medicare Other | Admitting: Gastroenterology

## 2020-10-08 ENCOUNTER — Other Ambulatory Visit: Payer: Medicare Other | Admitting: Vascular Surgery

## 2020-10-08 ENCOUNTER — Other Ambulatory Visit: Payer: Self-pay | Admitting: Family Medicine

## 2020-10-14 ENCOUNTER — Ambulatory Visit (HOSPITAL_COMMUNITY): Payer: Medicare Other | Attending: Orthopedic Surgery | Admitting: Physical Therapy

## 2020-10-14 ENCOUNTER — Encounter (HOSPITAL_COMMUNITY): Payer: Self-pay | Admitting: Physical Therapy

## 2020-10-14 ENCOUNTER — Other Ambulatory Visit: Payer: Self-pay

## 2020-10-14 DIAGNOSIS — M25562 Pain in left knee: Secondary | ICD-10-CM | POA: Diagnosis not present

## 2020-10-14 DIAGNOSIS — M6281 Muscle weakness (generalized): Secondary | ICD-10-CM | POA: Insufficient documentation

## 2020-10-14 DIAGNOSIS — R262 Difficulty in walking, not elsewhere classified: Secondary | ICD-10-CM | POA: Diagnosis not present

## 2020-10-14 DIAGNOSIS — M25662 Stiffness of left knee, not elsewhere classified: Secondary | ICD-10-CM | POA: Diagnosis not present

## 2020-10-14 NOTE — Therapy (Signed)
[place], [place], [address] Phone: [phone]   Fax:  [phone]  Physical Therapy Evaluation  Patient Details  Name: [name] MRN: [id number] Date of Birth: July 16, 1955 Referring Provider (PT): Frederik Pear   Encounter Date: 10/14/2020   PT End of Session - 10/14/20 1403    Visit Number 1    Number of Visits 8    Date for PT Re-Evaluation 12/09/20    Authorization Type medicare no VL    Authorization - Visit Number 1    Progress Note Due on Visit 10    PT Start Time 7517    PT Stop Time 1445    PT Time Calculation (min) 42 min    Activity Tolerance Patient tolerated treatment well    Behavior During Therapy [facility] for tasks assessed/performed           Past Medical History:  Diagnosis Date  . Abdominal aortic aneurysm (AAA) 3.0 cm to 5.0 cm in diameter in female [facility])    None seen on prior imaging?   Marland Kitchen Anxiety   . Cataract   . Chronic left shoulder pain   . Headache   . Hiatal hernia    small  . Hyperlipidemia   . Hypertension   . Pre-diabetes   . Schatzki's ring   . Seasonal allergies   . Tremor     Past Surgical History:  Procedure Laterality Date  . CARDIAC CATHETERIZATION  2005   "Ms. Fayad has essentially normal coronary arteries and normal left ventricular function.  She will be treated empirically with anti-reflux measures"  . CATARACT EXTRACTION, BILATERAL    . CHOLECYSTECTOMY    . COLONOSCOPY    06/16/2004   GYF:VCBSWH rectum/Diminutive polyps of the splenic flexure and the cecum/The remainder of the colonic mucosa appeared normal pathology (hyperplastic polyps).  . COLONOSCOPY N/A 08/07/2015   Dr. Gala Romney: 6 mm descending colon tubular adenoma, multiple medium-mouthed diverticula in sigmoid colon. surveillance 2022  . ESOPHAGOGASTRODUODENOSCOPY  09/11/2001   QPR:[id number] ring otherwise normal esophagus/Normal stomach/Normal D1 and D2 status post passage of a 56 [place]  Maloney dilator  . ESOPHAGOGASTRODUODENOSCOPY N/A 11/11/2017   Procedure: ESOPHAGOGASTRODUODENOSCOPY (EGD);  Surgeon: Daneil Dolin, MD; mild Schatzki ring s/p dilation and mild erosive gastropathy.  . ESOPHAGOGASTRODUODENOSCOPY (EGD) WITH ESOPHAGEAL DILATION N/A 01/25/2013   Dr. Gala Romney- schatzki's ring- 37 F dilation. small hiatal hernia  . HYSTEROSCOPY WITH D & C N/A 03/06/2019   Procedure: DILATATION AND CURETTAGE /HYSTEROSCOPY;  Surgeon: Jonnie Kind, MD;  Location: [facility];  Service: Gynecology;  Laterality: N/A;  . [name] DILATION N/A 11/11/2017   Procedure: [name] DILATION;  Surgeon: Daneil Dolin, MD;  Location: [facility];  Service: Endoscopy;  Laterality: N/A;  . POLYPECTOMY N/A 03/06/2019   Procedure: POLYPECTOMY ( REMOVAL OF ENDOMETRIAL POLYP);  Surgeon: Jonnie Kind, MD;  Location: [facility];  Service: Gynecology;  Laterality: N/A;  . TUBAL LIGATION      There were no vitals filed for this visit.        [facility] PT Assessment - 10/14/20 0001      Assessment   Medical Diagnosis left knee scope    Referring Provider (PT) Frederik Pear    Onset Date/Surgical Date 09/15/20    Next MD Visit 10/16/20    Prior Therapy for shoulders.      Precautions   Precautions None      Balance Screen   Has the patient fallen  in the past 6 months Yes    How many times? 1    Has the patient had a decrease in activity level because of a fear of falling?  No    Is the patient reluctant to leave their home because of a fear of falling?  No      Home Environment   Living Environment Private residence    Living Arrangements Other relatives    Available Help at Discharge Family    Type of [place] to enter    Entrance Stairs-Number of Steps 4    Entrance Stairs-Rails Can reach both    [place] One level    [place] - single point      Prior Function   Level of [place] Retired      Associate Professor   Overall Cognitive  Status Within Functional Limits for tasks assessed      Observation/Other Assessments   Observations portal sites healing well no signs of infection    Focus on Therapeutic Outcomes (FOTO)  36% function      Observation/Other Assessments-Edema    Edema Circumferential      Circumferential Edema   Circumferential - Right 43.5 cm   1 inch above knee   Circumferential - Left  45.0   1 inch above knee     ROM / Strength   AROM / PROM / Strength AROM;Strength      AROM   AROM Assessment Site Knee    Right/Left Knee Left;Right    Right Knee Extension 2   lacking   Right Knee Flexion 130    Left Knee Extension 15   lacking   Left Knee Flexion 100      Palpation   Palpation comment tenderness to palpation throughout left knee      Special Tests   Other special tests negative homan's on left      Ambulation/Gait   Ambulation/Gait Yes    Ambulation/Gait Assistance 6: Modified independent (Device/Increase time)    Ambulation Distance (Feet) 341 Feet    Assistive device Straight cane    Gait Pattern Decreased weight shift to left;Decreased dorsiflexion - left;Decreased hip/knee flexion - left;Decreased stance time - left;Decreased stride length    Ambulation Surface Level;Indoor    Gait velocity decreased    Gait Comments 60mw                      Objective measurements completed on examination: See above findings.       [facility] Adult PT Treatment/Exercise - 10/14/20 0001      Exercises   Exercises Knee/Hip      Knee/Hip Exercises: Supine   Quad Sets Left;10 reps    Heel Slides Left;20 reps   5-10 seconds holds                 PT Education - 10/14/20 1450    Education Details on compression garments and wearing them daily, on post op rehab, signs and symptoms of DVT, HEP and POC    Person(s) Educated Patient    Methods Explanation    Comprehension Verbalized understanding            PT Short Term Goals - 10/14/20 1438      PT SHORT TERM GOAL  #1   Title Patient will report at least 50% improvement in overall symptoms and/or function to demonstrate improved functional mobility  Time 3    Period Weeks    Status New    Target Date 11/04/20      PT SHORT TERM GOAL #2   Title Patient will be independent in self management strategies to improve quality of life and functional outcomes.    Time 3    Period Weeks    Status New    Target Date 11/04/20      PT SHORT TERM GOAL #3   Title Patient will be able to demonstrate 2-125 degrees of active left knee ROM    Time 3    Period Weeks    Status New    Target Date 11/04/20             PT Long Term Goals - 10/14/20 1438      PT LONG TERM GOAL #1   Title Patient will be able to ascend and descend stairs without railing and reciprocal gait pattern    Time 8    Period Weeks    Status New    Target Date 12/09/20      PT LONG TERM GOAL #2   Title Patient will improve on FOTO score to meet predicted outcomes to demonstrate improved functional mobility.    Time 8    Period Weeks    Status New    Target Date 12/09/20      PT LONG TERM GOAL #3   Title Patient will report at least 75% improvement in overall symptoms and/or function to demonstrate improved functional mobility    Time 8    Period Weeks    Status New    Target Date 12/09/20                  Plan - 10/14/20 1445    Clinical Impression Statement Patient is a 65 y.o. X who presents to physical therapy after left knee scope on 09/15/20. Patient demonstrates decreased strength, ROM restriction, balance deficits and gait abnormalities which are likely contributing to symptoms of pain and are negatively impacting patient ability to perform ADLs and functional mobility tasks. Patient will benefit from skilled physical therapy services to address these deficits to reduce pain, improve level of function with ADLs, functional mobility tasks, and reduce risk for falls.    Personal Factors and Comorbidities  Comorbidity 1    Comorbidities HTN    Examination-Activity Limitations Bathing;Sleep;Squat;Stairs;Stand;Transfers;Locomotion Level;Lift;Carry    Examination-Participation Restrictions Cleaning;Driving;Meal Prep;Yard Work;Shop;Community Activity    Stability/Clinical Decision Making Stable/Uncomplicated    Clinical Decision Making Low    Rehab Potential Excellent    PT Frequency 1x / week    PT Duration 8 weeks    PT Treatment/Interventions ADLs/Self Care Home Management;Balance training;Therapeutic exercise;Therapeutic activities;Functional mobility training;Stair training;Gait training;DME Instruction;Neuromuscular re-education;Electrical Stimulation;Cryotherapy;Passive range of motion;Dry needling    PT Next Visit Plan quad strength, knee ROM, functional strength as tolerated, gait mechanics    PT Home Exercise Plan heel slides, knee flexion seated, quad sets    Consulted and Agree with Plan of Care Patient           Patient will benefit from skilled therapeutic intervention in order to improve the following deficits and impairments:  Pain,Difficulty walking,Decreased range of motion,Increased edema,Decreased endurance,Decreased activity tolerance,Decreased balance  Visit Diagnosis: Difficulty in walking, not elsewhere classified  Muscle weakness (generalized)  Decreased range of motion (ROM) of left knee  Acute pain of left knee     Problem List Patient Active Problem List   Diagnosis Date Noted  .  Hyperlipidemia   . Hypertension   . Abdominal aortic aneurysm (AAA) 3.0 cm to 5.0 cm in diameter in female [facility])   . Nausea without vomiting 12/06/2018  . Loss of weight 12/06/2018  . Controlled diabetes mellitus type 2 with complications ([place]) 59/92/3414  . Essential hypertension 04/25/2017  . Hyperlipidemia LDL goal <70 04/25/2017  . Carotid artery stenosis without cerebral infarction, bilateral 04/25/2017  . AAA (abdominal aortic aneurysm) without rupture ([place]) 04/25/2017   . History of colonic polyps   . Diverticulosis of colon without hemorrhage   . RUQ pain 02/21/2013  . Abdominal pain, epigastric 01/05/2013  . Abdominal bruit 01/05/2013  . Esophageal dysphagia 01/05/2013  . Constipation 01/05/2013  . GERD (gastroesophageal reflux disease) 01/05/2013   2:53 PM, 10/14/20 Jerene Pitch, DPT Physical Therapy with [facility]  [phone] office  [place] [address] [place], [place], [address] Phone: [phone]   Fax:  [phone]  Name: [name] MRN: [id number] Date of Birth: 02-Apr-1956

## 2020-10-15 ENCOUNTER — Encounter (HOSPITAL_COMMUNITY): Payer: Medicare Other

## 2020-10-15 ENCOUNTER — Ambulatory Visit: Payer: Medicare Other | Admitting: Vascular Surgery

## 2020-10-22 ENCOUNTER — Ambulatory Visit (HOSPITAL_COMMUNITY): Payer: Medicare Other

## 2020-10-22 ENCOUNTER — Other Ambulatory Visit: Payer: Self-pay

## 2020-10-22 DIAGNOSIS — M6281 Muscle weakness (generalized): Secondary | ICD-10-CM | POA: Diagnosis not present

## 2020-10-22 DIAGNOSIS — M25662 Stiffness of left knee, not elsewhere classified: Secondary | ICD-10-CM

## 2020-10-22 DIAGNOSIS — R262 Difficulty in walking, not elsewhere classified: Secondary | ICD-10-CM | POA: Diagnosis not present

## 2020-10-22 DIAGNOSIS — M25562 Pain in left knee: Secondary | ICD-10-CM

## 2020-10-22 NOTE — Patient Instructions (Signed)
Access Code: [email or web address] URL: [email or web address] Date: 10/22/2020 Prepared by: Sherlyn Lees  Exercises Supine Knee Extension Mobilization with Weight - 1 x daily - 7 x weekly Supine Hamstring Stretch with Strap - 1 x daily - 7 x weekly - 3 sets - 3 reps - 30-60 sec hold Prone Knee Flexion - 1 x daily - 7 x weekly - 3 sets - 10 reps Prone Quadriceps Stretch with Strap - 1 x daily - 7 x weekly - 3 sets - 3 reps - 30-60 sec hold

## 2020-10-22 NOTE — Therapy (Signed)
[facility] [address] [place], [place], [address] Phone: [phone]   Fax:  [phone]  Physical Therapy Treatment  Patient Details  Name: [name] MRN: [id number] Date of Birth: [birth year] Referring Provider (PT): Frederik Pear   Encounter Date: 10/22/2020   PT End of Session - 10/22/20 1440     Visit Number 2    Number of Visits 8    Date for PT Re-Evaluation 12/09/20    Authorization Type medicare no VL    Authorization - Visit Number 2    Progress Note Due on Visit 10    PT Start Time 9024    PT Stop Time 1520    PT Time Calculation (min) 45 min    Activity Tolerance Patient tolerated treatment well    Behavior During Therapy [facility] for tasks assessed/performed             Past Medical History:  Diagnosis Date   Abdominal aortic aneurysm (AAA) 3.0 cm to 5.0 cm in diameter in female [facility])    None seen on prior imaging?    Anxiety    Cataract    Chronic left shoulder pain    Headache    Hiatal hernia    small   Hyperlipidemia    Hypertension    Pre-diabetes    Schatzki's ring    Seasonal allergies    Tremor     Past Surgical History:  Procedure Laterality Date   CARDIAC CATHETERIZATION  2005   "Ms. Kunath has essentially normal coronary arteries and normal left ventricular function.  She will be treated empirically with anti-reflux measures"   CATARACT EXTRACTION, BILATERAL     CHOLECYSTECTOMY     COLONOSCOPY    06/16/2004   OXB:[id number] rectum/Diminutive polyps of the splenic flexure and the cecum/The remainder of the colonic mucosa appeared normal pathology (hyperplastic polyps).   COLONOSCOPY N/A 08/07/2015   Dr. Gala Romney: 6 mm descending colon tubular adenoma, multiple medium-mouthed diverticula in sigmoid colon. surveillance 2022   ESOPHAGOGASTRODUODENOSCOPY  09/11/2001   MEQ:[id number] ring otherwise normal esophagus/Normal stomach/Normal D1 and D2 status post passage of a 56 French Maloney dilator    ESOPHAGOGASTRODUODENOSCOPY N/A 11/11/2017   Procedure: ESOPHAGOGASTRODUODENOSCOPY (EGD);  Surgeon: Daneil Dolin, MD; mild Schatzki ring s/p dilation and mild erosive gastropathy.   ESOPHAGOGASTRODUODENOSCOPY (EGD) WITH ESOPHAGEAL DILATION N/A 01/25/2013   Dr. Gala Romney- schatzki's ring- 69 F dilation. small hiatal hernia   HYSTEROSCOPY WITH D & C N/A 03/06/2019   Procedure: DILATATION AND CURETTAGE /HYSTEROSCOPY;  Surgeon: Jonnie Kind, MD;  Location: [facility];  Service: Gynecology;  Laterality: N/A;   MALONEY DILATION N/A 11/11/2017   Procedure: [name] DILATION;  Surgeon: Daneil Dolin, MD;  Location: [facility];  Service: Endoscopy;  Laterality: N/A;   POLYPECTOMY N/A 03/06/2019   Procedure: POLYPECTOMY ( REMOVAL OF ENDOMETRIAL POLYP);  Surgeon: Jonnie Kind, MD;  Location: [facility];  Service: Gynecology;  Laterality: N/A;   TUBAL LIGATION      There were no vitals filed for this visit.   Subjective Assessment - 10/22/20 1441     Subjective Feeling better and been doing my exercises and started doing chair yoga videos at home for exercise    Currently in Pain? Yes    Pain Score 3     Pain Location Knee    Pain Orientation Left    Pain Descriptors / Indicators Sore    Pain Type Acute pain;Surgical pain  [facility] PT Assessment - 10/22/20 0001       Assessment   Medical Diagnosis left knee scope    Referring Provider (PT) Frederik Pear    Onset Date/Surgical Date 09/15/20    Next MD Visit 10/16/20                           [facility] Adult PT Treatment/Exercise - 10/22/20 0001       Knee/Hip Exercises: Stretches   Passive Hamstring Stretch Left;2 reps;60 seconds    Quad Stretch Left;2 reps;60 seconds      Knee/Hip Exercises: Supine   Quad Sets Strengthening;Left;2 sets;10 reps    Heel Slides AAROM;Left;20 reps    Heel Prop for Knee Extension 2 minutes    Knee Extension AROM    Knee Extension Limitations 2-3 lacking    Knee Flexion AROM     Knee Flexion Limitations 115                    PT Education - 10/22/20 1520     Education Details education on POC development and progression and HEP additions    Person(s) Educated Patient    Methods Explanation    Comprehension Verbalized understanding              PT Short Term Goals - 10/14/20 1438       PT SHORT TERM GOAL #1   Title Patient will report at least 50% improvement in overall symptoms and/or function to demonstrate improved functional mobility    Time 3    Period Weeks    Status New    Target Date 11/04/20      PT SHORT TERM GOAL #2   Title Patient will be independent in self management strategies to improve quality of life and functional outcomes.    Time 3    Period Weeks    Status New    Target Date 11/04/20      PT SHORT TERM GOAL #3   Title Patient will be able to demonstrate 2-125 degrees of active left knee ROM    Time 3    Period Weeks    Status New    Target Date 11/04/20               PT Long Term Goals - 10/14/20 1438       PT LONG TERM GOAL #1   Title Patient will be able to ascend and descend stairs without railing and reciprocal gait pattern    Time 8    Period Weeks    Status New    Target Date 12/09/20      PT LONG TERM GOAL #2   Title Patient will improve on FOTO score to meet predicted outcomes to demonstrate improved functional mobility.    Time 8    Period Weeks    Status New    Target Date 12/09/20      PT LONG TERM GOAL #3   Title Patient will report at least 75% improvement in overall symptoms and/or function to demonstrate improved functional mobility    Time 8    Period Weeks    Status New    Target Date 12/09/20                   Plan - 10/22/20 1516     Clinical Impression Statement Tolerated tx session well without increase in knee discomfort and demonstrates good HEP recall.  Progressed with continued HEP development for knee ROM demonstrating some spinrgy end-feel for  left knee terminal extension with slight lack of extension. Continued POC indicated to progress left knee ROM and strength to improve gait and transfer kinematics    Personal Factors and Comorbidities Comorbidity 1    Comorbidities HTN    Examination-Activity Limitations Bathing;Sleep;Squat;Stairs;Stand;Transfers;Locomotion Level;Lift;Carry    Examination-Participation Restrictions Cleaning;Driving;Meal Prep;Yard Work;Shop;Community Activity    Stability/Clinical Decision Making Stable/Uncomplicated    Rehab Potential Excellent    PT Frequency 1x / week    PT Duration 8 weeks    PT Treatment/Interventions ADLs/Self Care Home Management;Balance training;Therapeutic exercise;Therapeutic activities;Functional mobility training;Stair training;Gait training;DME Instruction;Neuromuscular re-education;Electrical Stimulation;Cryotherapy;Passive range of motion;Dry needling    PT Next Visit Plan quad strength, knee ROM, functional strength as tolerated, gait mechanics, mini-squats    PT Home Exercise Plan heel slides, knee flexion seated, quad sets, hamstring stretch, prone hamstring curls, prone quad stretch    Consulted and Agree with Plan of Care Patient             Patient will benefit from skilled therapeutic intervention in order to improve the following deficits and impairments:  Pain, Difficulty walking, Decreased range of motion, Increased edema, Decreased endurance, Decreased activity tolerance, Decreased balance  Visit Diagnosis: Difficulty in walking, not elsewhere classified  Muscle weakness (generalized)  Decreased range of motion (ROM) of left knee  Acute pain of left knee     Problem List Patient Active Problem List   Diagnosis Date Noted   Hyperlipidemia    Hypertension    Abdominal aortic aneurysm (AAA) 3.0 cm to 5.0 cm in diameter in female [facility])    Nausea without vomiting 12/06/2018   Loss of weight 12/06/2018   Controlled diabetes mellitus type 2 with  complications ([place]) 45/36/4680   Essential hypertension 04/25/2017   Hyperlipidemia LDL goal <70 04/25/2017   Carotid artery stenosis without cerebral infarction, bilateral 04/25/2017   AAA (abdominal aortic aneurysm) without rupture ([place]) 04/25/2017   History of colonic polyps    Diverticulosis of colon without hemorrhage    RUQ pain 02/21/2013   Abdominal pain, epigastric 01/05/2013   Abdominal bruit 01/05/2013   Esophageal dysphagia 01/05/2013   Constipation 01/05/2013   GERD (gastroesophageal reflux disease) 01/05/2013   3:21 PM, 10/22/20 M. Sherlyn Lees, PT, DPT Physical Therapist- [place] Office Number: [phone]   [facility] [address] [place], [place], [address] Phone: [phone]   Fax:  [phone]  Name: [name] MRN: [id number] Date of Birth: 1956/03/16

## 2020-10-28 ENCOUNTER — Ambulatory Visit (HOSPITAL_COMMUNITY): Payer: Medicare Other | Admitting: Physical Therapy

## 2020-10-28 ENCOUNTER — Encounter: Payer: Self-pay | Admitting: Family Medicine

## 2020-10-28 ENCOUNTER — Other Ambulatory Visit: Payer: Self-pay

## 2020-10-28 ENCOUNTER — Ambulatory Visit (INDEPENDENT_AMBULATORY_CARE_PROVIDER_SITE_OTHER): Payer: Medicare Other | Admitting: Family Medicine

## 2020-10-28 ENCOUNTER — Encounter (HOSPITAL_COMMUNITY): Payer: Self-pay | Admitting: Physical Therapy

## 2020-10-28 VITALS — BP 142/62 | HR 70 | Temp 98.5°F | Resp 14 | Ht 67.0 in | Wt 171.0 lb

## 2020-10-28 DIAGNOSIS — M25562 Pain in left knee: Secondary | ICD-10-CM | POA: Diagnosis not present

## 2020-10-28 DIAGNOSIS — R519 Headache, unspecified: Secondary | ICD-10-CM

## 2020-10-28 DIAGNOSIS — M6281 Muscle weakness (generalized): Secondary | ICD-10-CM | POA: Diagnosis not present

## 2020-10-28 DIAGNOSIS — M25662 Stiffness of left knee, not elsewhere classified: Secondary | ICD-10-CM

## 2020-10-28 DIAGNOSIS — R262 Difficulty in walking, not elsewhere classified: Secondary | ICD-10-CM | POA: Diagnosis not present

## 2020-10-28 MED ORDER — TOPIRAMATE 25 MG PO TABS: 50.0000 mg | ORAL_TABLET | Freq: Two times a day (BID) | ORAL | 3 refills | Status: DC

## 2020-10-28 NOTE — Therapy (Signed)
[facility] [address] [place], [place], [address] Phone: [phone]   Fax:  [phone]  Physical Therapy Treatment  Patient Details  Name: [name] MRN: [id number] Date of Birth: 07/03/1955 Referring Provider (PT): Frederik Pear   Encounter Date: 10/28/2020   PT End of Session - 10/28/20 1402     Visit Number 3    Number of Visits 8    Date for PT Re-Evaluation 12/09/20    Authorization Type medicare no VL    Progress Note Due on Visit 10    PT Start Time 1402    PT Stop Time 1440    PT Time Calculation (min) 38 min    Activity Tolerance Patient tolerated treatment well    Behavior During Therapy [facility] for tasks assessed/performed             Past Medical History:  Diagnosis Date   Abdominal aortic aneurysm (AAA) 3.0 cm to 5.0 cm in diameter in female [facility])    None seen on prior imaging?    Anxiety    Cataract    Chronic left shoulder pain    Headache    Hiatal hernia    small   Hyperlipidemia    Hypertension    Pre-diabetes    Schatzki's ring    Seasonal allergies    Tremor     Past Surgical History:  Procedure Laterality Date   CARDIAC CATHETERIZATION  2005   "[name] has essentially normal coronary arteries and normal left ventricular function.  She will be treated empirically with anti-reflux measures"   CATARACT EXTRACTION, BILATERAL     CHOLECYSTECTOMY     COLONOSCOPY    06/16/2004   IWP:YKDXIP rectum/Diminutive polyps of the splenic flexure and the cecum/The remainder of the colonic mucosa appeared normal pathology (hyperplastic polyps).   COLONOSCOPY N/A 08/07/2015   Dr. Gala Romney: 6 mm descending colon tubular adenoma, multiple medium-mouthed diverticula in sigmoid colon. surveillance 2022   ESOPHAGOGASTRODUODENOSCOPY  09/11/2001   JAS:[id number] ring otherwise normal esophagus/Normal stomach/Normal D1 and D2 status post passage of a 56 French Maloney dilator   ESOPHAGOGASTRODUODENOSCOPY N/A  11/11/2017   Procedure: ESOPHAGOGASTRODUODENOSCOPY (EGD);  Surgeon: Daneil Dolin, MD; mild Schatzki ring s/p dilation and mild erosive gastropathy.   ESOPHAGOGASTRODUODENOSCOPY (EGD) WITH ESOPHAGEAL DILATION N/A 01/25/2013   Dr. Gala Romney- schatzki's ring- 78 F dilation. small hiatal hernia   HYSTEROSCOPY WITH D & C N/A 03/06/2019   Procedure: DILATATION AND CURETTAGE /HYSTEROSCOPY;  Surgeon: Jonnie Kind, MD;  Location: [facility];  Service: Gynecology;  Laterality: N/A;   MALONEY DILATION N/A 11/11/2017   Procedure: [name] DILATION;  Surgeon: Daneil Dolin, MD;  Location[facility];  Service: Endoscopy;  Laterality: N/A;   POLYPECTOMY N/A 03/06/2019   Procedure: POLYPECTOMY ( REMOVAL OF ENDOMETRIAL POLYP);  Surgeon: Jonnie Kind, MD;  Location: [facility];  Service: Gynecology;  Laterality: N/A;   TUBAL LIGATION      There were no vitals filed for this visit.   Subjective Assessment - 10/28/20 1406     Subjective States on Friday she hit her knee on the bedpost which increased her pain. Current pain is 1/10 nagging pain where the portal site is where she hit it. States her exercises are going well with no difficulties.    Currently in Pain? Yes    Pain Score 1     Pain Location Knee    Pain Orientation Left    Pain Descriptors / Indicators  Nagging    Pain Type Surgical pain                [facility] PT Assessment - 10/28/20 0001       Assessment   Medical Diagnosis left knee scope    Referring Provider (PT) Frederik Pear    Onset Date/Surgical Date 09/15/20    Next MD Visit 11/13/20                           [facility] Adult PT Treatment/Exercise - 10/28/20 0001       Knee/Hip Exercises: Stretches   Passive Hamstring Stretch Left;3 reps;30 seconds   seated   Gastroc Stretch Left;3 reps;30 seconds   incline board     Knee/Hip Exercises: Standing   Forward Step Up Left;10 reps;Hand Hold: 1;3 sets   fisrt set with 1 hand and 2 sets with no hands     Knee/Hip  Exercises: Seated   Sit to Sand without UE support;3 sets;10 reps   focus on usign left LE     Knee/Hip Exercises: Supine   Heel Slides AROM;Left;10 reps   10" holds at end ranges   Bridges AROM;10 reps;3 sets    Knee Extension AROM;Left    Knee Extension Limitations lacking 2    Knee Flexion AROM    Knee Flexion Limitations 123                      PT Short Term Goals - 10/28/20 1428       PT SHORT TERM GOAL #1   Title Patient will report at least 50% improvement in overall symptoms and/or function to demonstrate improved functional mobility    Baseline 100% better    Time 3    Period Weeks    Status Achieved    Target Date 11/04/20      PT SHORT TERM GOAL #2   Title Patient will be independent in self management strategies to improve quality of life and functional outcomes.    Time 3    Period Weeks    Status On-going    Target Date 11/04/20      PT SHORT TERM GOAL #3   Title Patient will be able to demonstrate 2-125 degrees of active left knee ROM    Time 3    Period Weeks    Status On-going    Target Date 11/04/20               PT Long Term Goals - 10/28/20 1428       PT LONG TERM GOAL #1   Title Patient will be able to ascend and descend stairs without railing and reciprocal gait pattern    Time 8    Period Weeks    Status On-going      PT LONG TERM GOAL #2   Title Patient will improve on FOTO score to meet predicted outcomes to demonstrate improved functional mobility.    Time 8    Period Weeks    Status On-going      PT LONG TERM GOAL #3   Title Patient will report at least 75% improvement in overall symptoms and/or function to demonstrate improved functional mobility    Time 8    Period Weeks    Status On-going                   Plan - 10/28/20 1403     Clinical  Impression Statement Overall patient is progressing well. Improved knee flexion noted on this date. Progressed strengthening exercises to standing. This was  tolerated well but fatigue noted. No increase in pain noted during or after session. Will continue to work on end range knee extension and functional strength as tolerated.    Personal Factors and Comorbidities Comorbidity 1    Comorbidities HTN    Examination-Activity Limitations Bathing;Sleep;Squat;Stairs;Stand;Transfers;Locomotion Level;Lift;Carry    Examination-Participation Restrictions Cleaning;Driving;Meal Prep;Yard Work;Shop;Community Activity    Stability/Clinical Decision Making Stable/Uncomplicated    Rehab Potential Excellent    PT Frequency 1x / week    PT Duration 8 weeks    PT Treatment/Interventions ADLs/Self Care Home Management;Balance training;Therapeutic exercise;Therapeutic activities;Functional mobility training;Stair training;Gait training;DME Instruction;Neuromuscular re-education;Electrical Stimulation;Cryotherapy;Passive range of motion;Dry needling    PT Next Visit Plan quad strength, knee ROM, functional strength as tolerated, gait mechanics, mini-squats    PT Home Exercise Plan heel slides, knee flexion seated, quad sets, hamstring stretch, prone hamstring curls, prone quad stretch; 6/21 STS (elevated), bridge, Step up 4 inch    Consulted and Agree with Plan of Care Patient             Patient will benefit from skilled therapeutic intervention in order to improve the following deficits and impairments:  Pain, Difficulty walking, Decreased range of motion, Increased edema, Decreased endurance, Decreased activity tolerance, Decreased balance  Visit Diagnosis: Difficulty in walking, not elsewhere classified  Decreased range of motion (ROM) of left knee  Muscle weakness (generalized)  Acute pain of left knee     Problem List Patient Active Problem List   Diagnosis Date Noted   Hyperlipidemia    Hypertension    Abdominal aortic aneurysm (AAA) 3.0 cm to 5.0 cm in diameter in female [facility])    Nausea without vomiting 12/06/2018   Loss of weight 12/06/2018    Controlled diabetes mellitus type 2 with complications ([place]) 06/20/1550   Essential hypertension 04/25/2017   Hyperlipidemia LDL goal <70 04/25/2017   Carotid artery stenosis without cerebral infarction, bilateral 04/25/2017   AAA (abdominal aortic aneurysm) without rupture ([place]) 04/25/2017   History of colonic polyps    Diverticulosis of colon without hemorrhage    RUQ pain 02/21/2013   Abdominal pain, epigastric 01/05/2013   Abdominal bruit 01/05/2013   Esophageal dysphagia 01/05/2013   Constipation 01/05/2013   GERD (gastroesophageal reflux disease) 01/05/2013  2:40 PM, 10/28/20 [name], [name] Physical Therapy with [facility]  [phone] office   [facility] [address] [place], [place], [address] Phone: [phone]   Fax:  [phone]  Name: [name] MRN: [id number] Date of Birth: 04-Jun-1955

## 2020-10-28 NOTE — Progress Notes (Signed)
Subjective:    Patient ID: [name], female    DOB: 1955/11/05, 65 y.o.   MRN: [id number]  Patient states that she has been having "headaches" off and on for a year.  She states that she will develop pressure in her forehead above both of her eyes.  She describes it as a tightness or a bandlike pain.  It would then radiate backwards towards her occiput in a bandlike fashion over the top of her head.  She reports it is feeling a tightness.  She has attributed to her blood pressure in the past however it occurs frequently and even when her blood pressure is under good control.  She also reports pain and pressure around her left eye.  It was so bad that she went to the emergency room in March and at that time they did a CT scan of the head that revealed no abnormality and no significant sinusitis either.  She was treated presumptively for sinus infection at that point by the ER physician but that did not improve the symptoms.  She denies any photophobia or phonophobia or nausea or vomiting or dizziness.  She denies any neurologic deficits.  She denies any otalgia or pain with chewing or blurry vision at the present time.  Differential diagnosis would be chronic tension headache versus atypical migraine versus temporal arteritis.  However given the fact the headaches have been present off and on for a year I feel that temporal arteritis is unlikely Past Medical History:  Diagnosis Date   Abdominal aortic aneurysm (AAA) 3.0 cm to 5.0 cm in diameter in female [facility])    None seen on prior imaging?    Anxiety    Cataract    Chronic left shoulder pain    Headache    Hiatal hernia    small   Hyperlipidemia    Hypertension    Pre-diabetes    Schatzki's ring    Seasonal allergies    Tremor    Past Surgical History:  Procedure Laterality Date   CARDIAC CATHETERIZATION  2005   "Ms. Duerr has essentially normal coronary arteries and normal left ventricular function.  She will be treated  empirically with anti-reflux measures"   CATARACT EXTRACTION, BILATERAL     CHOLECYSTECTOMY     COLONOSCOPY    06/16/2004   NOM:VEHMCN rectum/Diminutive polyps of the splenic flexure and the cecum/The remainder of the colonic mucosa appeared normal pathology (hyperplastic polyps).   COLONOSCOPY N/A 08/07/2015   Dr. Gala Romney: 6 mm descending colon tubular adenoma, multiple medium-mouthed diverticula in sigmoid colon. surveillance 2022   ESOPHAGOGASTRODUODENOSCOPY  09/11/2001   OBS:[id number] ring otherwise normal esophagus/Normal stomach/Normal D1 and D2 status post passage of a 56 French Maloney dilator   ESOPHAGOGASTRODUODENOSCOPY N/A 11/11/2017   Procedure: ESOPHAGOGASTRODUODENOSCOPY (EGD);  Surgeon: Daneil Dolin, MD; mild Schatzki ring s/p dilation and mild erosive gastropathy.   ESOPHAGOGASTRODUODENOSCOPY (EGD) WITH ESOPHAGEAL DILATION N/A 01/25/2013   Dr. Gala Romney- schatzki's ring- 69 F dilation. small hiatal hernia   HYSTEROSCOPY WITH D & C N/A 03/06/2019   Procedure: DILATATION AND CURETTAGE /HYSTEROSCOPY;  Surgeon: Jonnie Kind, MD;  Location: [facility];  Service: Gynecology;  Laterality: N/A;   MALONEY DILATION N/A 11/11/2017   Procedure: [name] DILATION;  Surgeon: Daneil Dolin, MD;  Location: [facility];  Service: Endoscopy;  Laterality: N/A;   POLYPECTOMY N/A 03/06/2019   Procedure: POLYPECTOMY ( REMOVAL OF ENDOMETRIAL POLYP);  Surgeon: Jonnie Kind, MD;  Location: [facility];  Service:  Gynecology;  Laterality: N/A;   TUBAL LIGATION     Current Outpatient Medications on File Prior to Visit  Medication Sig Dispense Refill   amLODipine (NORVASC) 10 MG tablet TAKE 1 TABLET(10 MG) BY MOUTH AT BEDTIME 90 tablet 3   rosuvastatin (CRESTOR) 5 MG tablet TAKE 1 TABLET(5 MG) BY MOUTH DAILY 90 tablet 3   acetaminophen (TYLENOL) 500 MG tablet Take 500 mg by mouth every 6 (six) hours as needed for moderate pain or headache.     ALPRAZolam (XANAX) 0.5 MG tablet TAKE 1 TABLET(0.5 MG) BY MOUTH  TWICE DAILY AS NEEDED FOR ANXIETY (Patient not taking: Reported on 08/19/2020) 60 tablet 0   aspirin EC 81 MG tablet Take 81 mg by mouth at bedtime.      cholecalciferol (VITAMIN D3) 25 MCG (1000 UT) tablet Take 2,000 Units by mouth daily.     Cyanocobalamin (B-12) 2500 MCG TABS Take 2,500 mcg by mouth daily.     doxazosin (CARDURA) 2 MG tablet Take 1 mg by mouth daily.     EPINEPHrine 0.3 mg/0.3 mL IJ SOAJ injection Inject 0.3 mg into the muscle as needed for anaphylaxis.      fluticasone (FLONASE) 50 MCG/ACT nasal spray SHAKE LIQUID AND USE 2 SPRAYS IN EACH NOSTRIL DAILY (Patient taking differently: Place 2 sprays into both nostrils daily.) 16 g 6   hydrochlorothiazide (HYDRODIURIL) 25 MG tablet TAKE 1 TABLET(25 MG) BY MOUTH DAILY 30 tablet 11   hydrOXYzine (ATARAX/VISTARIL) 25 MG tablet Take 0.5-1 tablets (12.5-25 mg total) by mouth every 8 (eight) hours as needed for itching. 20 tablet 0   ipratropium (ATROVENT) 0.03 % nasal spray INSTILL 2 SPRAYS IN EACH NOSTRIL EVERY 12 HOURS 30 mL 12   levocetirizine (XYZAL) 5 MG tablet Take 5 mg by mouth every evening.     LINZESS 145 MCG CAPS capsule TAKE 1 CAPSULE(145 MCG) BY MOUTH DAILY BEFORE BREAKFAST (Patient taking differently: Take 145 mcg by mouth daily before breakfast.) 30 capsule 3   loratadine (CLARITIN) 10 MG tablet Take 10 mg by mouth daily as needed for allergies.     MAGNESIUM PO Take 330 mg by mouth daily.     metoprolol succinate (TOPROL-XL) 100 MG 24 hr tablet Take 1 tablet (100 mg total) by mouth daily. 90 tablet 3   mometasone (ELOCON) 0.1 % cream Apply 1 application topically daily as needed (for irritation).      montelukast (SINGULAIR) 10 MG tablet TAKE 1 TABLET(10 MG) BY MOUTH AT BEDTIME (Patient taking differently: Take 10 mg by mouth at bedtime.) 30 tablet 3   omeprazole (PRILOSEC) 40 MG capsule Take 1 capsule (40 mg total) by mouth 2 (two) times daily before a meal. 60 capsule 3   ondansetron (ZOFRAN) 4 MG tablet Take 1 tablet (4  mg total) by mouth every 8 (eight) hours as needed for nausea or vomiting. 30 tablet 1   potassium chloride SA (KLOR-CON) 20 MEQ tablet TAKE 1 TABLET(20 MEQ) BY MOUTH DAILY (Patient taking differently: Take 20 mEq by mouth daily.) 30 tablet 5   sodium chloride (OCEAN) 0.65 % SOLN nasal spray Place 1 spray into both nostrils as needed for congestion.     sucralfate (CARAFATE) 1 g tablet TAKE 1 TABLET(1 GRAM) BY MOUTH FOUR TIMES DAILY WITH MEALS AND AT BEDTIME 120 tablet 0   tacrolimus (PROTOPIC) 0.1 % ointment Apply 1 application topically daily as needed (for irritation).      triamcinolone cream (KENALOG) 0.1 % APPLY EXTERNALLY TO THE AFFECTED  AREA TWICE DAILY (Patient taking differently: Apply 1 application topically 2 (two) times daily as needed (Rash).) 45 g 0   valsartan (DIOVAN) 320 MG tablet TAKE 1 TABLET BY MOUTH EVERY DAY 90 tablet 1   vitamin C (ASCORBIC ACID) 250 MG tablet Take 500 mg by mouth daily.     vitamin E 400 UNIT capsule Take 400 Units by mouth daily.     zinc gluconate 50 MG tablet Take 50 mg by mouth daily.     No current facility-administered medications on file prior to visit.   Allergies  Allergen Reactions   Ciprofloxacin Shortness Of Breath   Latex Other (See Comments)    Burns skin   Statins     Muscle aches   Social History   Socioeconomic History   Marital status: Single    Spouse name: Not on file   Number of children: 3   Years of education: Not on file   Highest education level: Not on file  Occupational History   Occupation: English as a second language teacher: [employer]  Tobacco Use   Smoking status: Former    Packs/day: 0.25    Years: 35.00    Pack years: 8.75    Types: Cigarettes    Quit date: 12/31/2012    Years since quitting: 7.8   Smokeless tobacco: Never  Vaping Use   Vaping Use: Never used  Substance and Sexual Activity   Alcohol use: Not Currently   Drug use: No   Sexual activity: Not Currently    Partners: Male    Birth  control/protection: Post-menopausal, Surgical    Comment: tubal  Other Topics Concern   Not on file  Social History Narrative   Eats all food groups.    Wears seatbelt.    Has 3 children.   Prior smoker.   Attends church.    Used to work in [employer].    Drives.   Enjoys going out with family.    Social Determinants of Health   Financial Resource Strain: Not on file  Food Insecurity: Not on file  Transportation Needs: Not on file  Physical Activity: Not on file  Stress: Not on file  Social Connections: Not on file  Intimate Partner Violence: Not on file   Family History  Problem Relation Age of Onset   Diabetes Other    Diabetes Mother    Heart disease Mother    Heart disease Father    Diabetes Sister    Diabetes Brother    Hypertension Brother    Diabetes Daughter    Hypertension Maternal Grandmother    Stroke Maternal Grandfather    Early death Paternal Grandfather    Colon cancer Neg Hx    Liver disease Neg Hx       Review of Systems  Neurological:  Positive for headaches.  All other systems reviewed and are negative.     Objective:   Physical Exam Vitals reviewed.  Constitutional:      Appearance: She is well-developed.  Eyes:     General: No visual field deficit.    Extraocular Movements:     Right eye: Normal extraocular motion and no nystagmus.     Left eye: Normal extraocular motion and no nystagmus.     Pupils:     Right eye: Pupil is round and reactive.     Left eye: Pupil is round and reactive.  Cardiovascular:     Rate and Rhythm: Normal rate and regular rhythm.  Heart sounds: Normal heart sounds. No murmur heard.   No friction rub. No gallop.  Pulmonary:     Effort: Pulmonary effort is normal. No respiratory distress.     Breath sounds: Normal breath sounds. No stridor. No wheezing or rales.  Chest:     Chest wall: No tenderness.  Musculoskeletal:     Right hand: Normal. No swelling, deformity, tenderness or bony tenderness. Normal  range of motion.     Left hand: Normal. No swelling, deformity, tenderness or bony tenderness. Normal range of motion.     Cervical back: Normal range of motion. No rigidity.  Neurological:     Mental Status: She is alert and oriented to person, place, and time. Mental status is at baseline.     Cranial Nerves: No cranial nerve deficit, dysarthria or facial asymmetry.     Sensory: No sensory deficit.     Motor: Tremor present. No weakness or abnormal muscle tone.     Coordination: Romberg sign negative. Coordination normal.     Gait: Gait normal.          Assessment & Plan:  Chronic daily headache Differential diagnosis includes chronic tension headache versus atypical migraines versus temporal arteritis. Start the patient on Topamax 25 mg p.o. daily and increase by 25 mg daily each week until the patient is taking 50 mg twice daily and then reassess in 1 month.  If headache intensifies check sed rate to evaluate for temporal arteritis and consult neurology

## 2020-11-04 ENCOUNTER — Encounter (HOSPITAL_COMMUNITY): Payer: Self-pay

## 2020-11-04 ENCOUNTER — Other Ambulatory Visit: Payer: Self-pay

## 2020-11-04 ENCOUNTER — Ambulatory Visit (HOSPITAL_COMMUNITY): Payer: Medicare Other

## 2020-11-04 DIAGNOSIS — M6281 Muscle weakness (generalized): Secondary | ICD-10-CM

## 2020-11-04 DIAGNOSIS — R262 Difficulty in walking, not elsewhere classified: Secondary | ICD-10-CM | POA: Diagnosis not present

## 2020-11-04 DIAGNOSIS — M25662 Stiffness of left knee, not elsewhere classified: Secondary | ICD-10-CM | POA: Diagnosis not present

## 2020-11-04 DIAGNOSIS — M25562 Pain in left knee: Secondary | ICD-10-CM

## 2020-11-04 NOTE — Therapy (Signed)
[facility] [address] [place], [place], [address] Phone: [phone]   Fax:  [phone]  Physical Therapy Treatment  Patient Details  Name: [name] MRN: [id number] Date of Birth: 1956-01-15 Referring Provider (PT): Frederik Pear   Encounter Date: 11/04/2020   PT End of Session - 11/04/20 1321     Visit Number 4    Number of Visits 8    Date for PT Re-Evaluation 12/09/20    Authorization Type medicare no VL    Authorization - Visit Number 3    Progress Note Due on Visit 10    PT Start Time 1315    PT Stop Time 1357    PT Time Calculation (min) 42 min    Activity Tolerance Patient tolerated treatment well    Behavior During Therapy [facility] for tasks assessed/performed             Past Medical History:  Diagnosis Date   Abdominal aortic aneurysm (AAA) 3.0 cm to 5.0 cm in diameter in female [facility])    None seen on prior imaging?    Anxiety    Cataract    Chronic left shoulder pain    Headache    Hiatal hernia    small   Hyperlipidemia    Hypertension    Pre-diabetes    Schatzki's ring    Seasonal allergies    Tremor     Past Surgical History:  Procedure Laterality Date   CARDIAC CATHETERIZATION  2005   "Ms. Celmer has essentially normal coronary arteries and normal left ventricular function.  She will be treated empirically with anti-reflux measures"   CATARACT EXTRACTION, BILATERAL     CHOLECYSTECTOMY     COLONOSCOPY    06/16/2004   GEZ:MOQHUT rectum/Diminutive polyps of the splenic flexure and the cecum/The remainder of the colonic mucosa appeared normal pathology (hyperplastic polyps).   COLONOSCOPY N/A 08/07/2015   Dr. Gala Romney: 6 mm descending colon tubular adenoma, multiple medium-mouthed diverticula in sigmoid colon. surveillance 2022   ESOPHAGOGASTRODUODENOSCOPY  09/11/2001   MLY:[id number] ring otherwise normal esophagus/Normal stomach/Normal D1 and D2 status post passage of a 56 French Maloney dilator    ESOPHAGOGASTRODUODENOSCOPY N/A 11/11/2017   Procedure: ESOPHAGOGASTRODUODENOSCOPY (EGD);  Surgeon: Daneil Dolin, MD; mild Schatzki ring s/p dilation and mild erosive gastropathy.   ESOPHAGOGASTRODUODENOSCOPY (EGD) WITH ESOPHAGEAL DILATION N/A 01/25/2013   Dr. Gala Romney- schatzki's ring- 78 F dilation. small hiatal hernia   HYSTEROSCOPY WITH D & C N/A 03/06/2019   Procedure: DILATATION AND CURETTAGE /HYSTEROSCOPY;  Surgeon: Jonnie Kind, MD;  Location: [facility];  Service: Gynecology;  Laterality: N/A;   MALONEY DILATION N/A 11/11/2017   Procedure: [name] DILATION;  Surgeon: Daneil Dolin, MD;  Location[facility];  Service: Endoscopy;  Laterality: N/A;   POLYPECTOMY N/A 03/06/2019   Procedure: POLYPECTOMY ( REMOVAL OF ENDOMETRIAL POLYP);  Surgeon: Jonnie Kind, MD;  Location: [facility];  Service: Gynecology;  Laterality: N/A;   TUBAL LIGATION      There were no vitals filed for this visit.   Subjective Assessment - 11/04/20 1319     Subjective Knee is feeling good today, no reports of pain currently.    Currently in Pain? No/denies                               [facility] Adult PT Treatment/Exercise - 11/04/20 0001       Ambulation/Gait  Ambulation/Gait Yes    Ambulation/Gait Assistance 6: Modified independent (Device/Increase time)    Ambulation Distance (Feet) 226 Feet    Assistive device None    Gait Pattern Step-through pattern;Decreased stance time - left;Decreased step length - right    Ambulation Surface Level;Indoor    Gait velocity decreased      Exercises   Exercises Knee/Hip      Knee/Hip Exercises: Standing   Heel Raises 10 reps    Terminal Knee Extension Left;10 reps;Theraband    Theraband Level (Terminal Knee Extension) Level 3 (Green)    Terminal Knee Extension Limitations 5" holds    Lateral Step Up Left;10 reps;Hand Hold: 1;Step Height: 4"    Forward Step Up Left;2 sets;10 reps;Hand Hold: 2;Hand Hold: 1;Step Height: 4"      Knee/Hip  Exercises: Seated   Long Arc Quad 2 sets;10 reps    Long Arc Quad Weight 3 lbs.    Long [employer] Limitations 2nd set wiht 3#    Sit to [employer] 2 sets;5 reps;without UE support   cueing for equal weight bearing     Knee/Hip Exercises: Supine   Short Arc [employer] 15 reps    Bridges AROM;15 reps    Straight Leg Raises 10 reps    Straight Leg Raises Limitations quad set prior raise                      PT Short Term Goals - 10/28/20 1428       PT SHORT TERM GOAL #1   Title Patient will report at least 50% improvement in overall symptoms and/or function to demonstrate improved functional mobility    Baseline 100% better    Time 3    Period Weeks    Status Achieved    Target Date 11/04/20      PT SHORT TERM GOAL #2   Title Patient will be independent in self management strategies to improve quality of life and functional outcomes.    Time 3    Period Weeks    Status On-going    Target Date 11/04/20      PT SHORT TERM GOAL #3   Title Patient will be able to demonstrate 2-125 degrees of active left knee ROM    Time 3    Period Weeks    Status On-going    Target Date 11/04/20               PT Long Term Goals - 10/28/20 1428       PT LONG TERM GOAL #1   Title Patient will be able to ascend and descend stairs without railing and reciprocal gait pattern    Time 8    Period Weeks    Status On-going      PT LONG TERM GOAL #2   Title Patient will improve on FOTO score to meet predicted outcomes to demonstrate improved functional mobility.    Time 8    Period Weeks    Status On-going      PT LONG TERM GOAL #3   Title Patient will report at least 75% improvement in overall symptoms and/or function to demonstrate improved functional mobility    Time 8    Period Weeks    Status On-going                   Plan - 11/04/20 1349     Clinical Impression Statement Pt progressing well toward goals.  Added standing  and seated quad strengthening exercises  with good form and control following initial instructions.  Advanced HEP with additional SAQ and SLR, cueing for quad set prior raise to address extension lag wtih exercise.  No reports of pain through session.    Personal Factors and Comorbidities Comorbidity 1    Comorbidities HTN    Examination-Activity Limitations Bathing;Sleep;Squat;Stairs;Stand;Transfers;Locomotion Level;Lift;Carry    Examination-Participation Restrictions Cleaning;Driving;Meal Prep;Yard Work;Shop;Community Activity    Clinical Decision Making Low    Rehab Potential Excellent    PT Frequency 1x / week    PT Duration 8 weeks    PT Treatment/Interventions ADLs/Self Care Home Management;Balance training;Therapeutic exercise;Therapeutic activities;Functional mobility training;Stair training;Gait training;DME Instruction;Neuromuscular re-education;Electrical Stimulation;Cryotherapy;Passive range of motion;Dry needling    PT Next Visit Plan quad strength, knee ROM, functional strength as tolerated, gait mechanics, mini-squats    PT Home Exercise Plan heel slides, knee flexion seated, quad sets, hamstring stretch, prone hamstring curls, prone quad stretch; 6/21 STS (elevated), bridge, Step up 4 inch    Consulted and Agree with Plan of Care Patient             Patient will benefit from skilled therapeutic intervention in order to improve the following deficits and impairments:  Pain, Difficulty walking, Decreased range of motion, Increased edema, Decreased endurance, Decreased activity tolerance, Decreased balance  Visit Diagnosis: Muscle weakness (generalized)  Acute pain of left knee  Decreased range of motion (ROM) of left knee  Difficulty in walking, not elsewhere classified     Problem List Patient Active Problem List   Diagnosis Date Noted   Hyperlipidemia    Hypertension    Abdominal aortic aneurysm (AAA) 3.0 cm to 5.0 cm in diameter in female [facility])    Nausea without vomiting 12/06/2018   Loss of weight  12/06/2018   Controlled diabetes mellitus type 2 with complications ([place]) 53/61/4431   Essential hypertension 04/25/2017   Hyperlipidemia LDL goal <70 04/25/2017   Carotid artery stenosis without cerebral infarction, bilateral 04/25/2017   AAA (abdominal aortic aneurysm) without rupture ([place]) 04/25/2017   History of colonic polyps    Diverticulosis of colon without hemorrhage    RUQ pain 02/21/2013   Abdominal pain, epigastric 01/05/2013   Abdominal bruit 01/05/2013   Esophageal dysphagia 01/05/2013   Constipation 01/05/2013   GERD (gastroesophageal reflux disease) 01/05/2013   Ihor Austin, LPTA/CLT; CBIS [phone]  Aldona Lento 11/04/2020, 2:00 PM  [facility] [address] [place], [place], [address] Phone: [phone]   Fax:  [phone]  Name: [name] MRN: [id number] Date of Birth: [birth year]

## 2020-11-05 ENCOUNTER — Other Ambulatory Visit: Payer: Self-pay | Admitting: Family Medicine

## 2020-11-11 ENCOUNTER — Other Ambulatory Visit: Payer: Self-pay

## 2020-11-11 ENCOUNTER — Ambulatory Visit (HOSPITAL_COMMUNITY): Payer: Medicare Other | Attending: Orthopedic Surgery

## 2020-11-11 DIAGNOSIS — M25662 Stiffness of left knee, not elsewhere classified: Secondary | ICD-10-CM

## 2020-11-11 DIAGNOSIS — M25562 Pain in left knee: Secondary | ICD-10-CM | POA: Diagnosis not present

## 2020-11-11 DIAGNOSIS — R262 Difficulty in walking, not elsewhere classified: Secondary | ICD-10-CM | POA: Diagnosis not present

## 2020-11-11 DIAGNOSIS — M6281 Muscle weakness (generalized): Secondary | ICD-10-CM | POA: Diagnosis not present

## 2020-11-11 NOTE — Patient Instructions (Signed)
Access Code: [email or web address] URL: [email or web address] Date: 11/11/2020 Prepared by: Sherlyn Lees  Exercises Seated Knee Extension with Resistance - 1 x daily - 7 x weekly - 3 sets - 10 reps Standing Hamstring Curl with Resistance - 1 x daily - 7 x weekly - 3 sets - 10 reps Sit to Stand with Resistance Around Legs - 1 x daily - 7 x weekly - 3 sets - 10 reps Side Stepping with Resistance at Thighs - 1 x daily - 7 x weekly - 3 sets - 10 reps

## 2020-11-11 NOTE — Therapy (Signed)
[place], [place], [address] Phone: [phone]   Fax:  [phone]  Physical Therapy Treatment  Patient Details  Name: PARADISE VENSEL MRN: [id number] Date of Birth: 11/22/1955 Referring Provider (PT): Frederik Pear   Encounter Date: 11/11/2020   PT End of Session - 11/11/20 1431     Visit Number 5    Number of Visits 8    Date for PT Re-Evaluation 12/09/20    Authorization Type medicare no VL    Progress Note Due on Visit 10    PT Start Time 1430    PT Stop Time 1515    PT Time Calculation (min) 45 min    Activity Tolerance Patient tolerated treatment well    Behavior During Therapy [facility] for tasks assessed/performed             Past Medical History:  Diagnosis Date   Abdominal aortic aneurysm (AAA) 3.0 cm to 5.0 cm in diameter in female [facility])    None seen on prior imaging?    Anxiety    Cataract    Chronic left shoulder pain    Headache    Hiatal hernia    small   Hyperlipidemia    Hypertension    Pre-diabetes    Schatzki's ring    Seasonal allergies    Tremor     Past Surgical History:  Procedure Laterality Date   CARDIAC CATHETERIZATION  2005   "Ms. Ehrlich has essentially normal coronary arteries and normal left ventricular function.  She will be treated empirically with anti-reflux measures"   CATARACT EXTRACTION, BILATERAL     CHOLECYSTECTOMY     COLONOSCOPY    06/16/2004   GYK:ZLDJTT rectum/Diminutive polyps of the splenic flexure and the cecum/The remainder of the colonic mucosa appeared normal pathology (hyperplastic polyps).   COLONOSCOPY N/A 08/07/2015   Dr. Gala Romney: 6 mm descending colon tubular adenoma, multiple medium-mouthed diverticula in sigmoid colon. surveillance 2022   ESOPHAGOGASTRODUODENOSCOPY  09/11/2001   SVX:[id number] ring otherwise normal esophagus/Normal stomach/Normal D1 and D2 status post passage of a 56 French Maloney dilator   ESOPHAGOGASTRODUODENOSCOPY N/A  11/11/2017   Procedure: ESOPHAGOGASTRODUODENOSCOPY (EGD);  Surgeon: Daneil Dolin, MD; mild Schatzki ring s/p dilation and mild erosive gastropathy.   ESOPHAGOGASTRODUODENOSCOPY (EGD) WITH ESOPHAGEAL DILATION N/A 01/25/2013   Dr. Gala Romney- schatzki's ring- 17 F dilation. small hiatal hernia   HYSTEROSCOPY WITH D & C N/A 03/06/2019   Procedure: DILATATION AND CURETTAGE /HYSTEROSCOPY;  Surgeon: Jonnie Kind, MD;  Location: [facility];  Service: Gynecology;  Laterality: N/A;   MALONEY DILATION N/A 11/11/2017   Procedure: [name] DILATION;  Surgeon: Daneil Dolin, MD;  Location: [facility];  Service: Endoscopy;  Laterality: N/A;   POLYPECTOMY N/A 03/06/2019   Procedure: POLYPECTOMY ( REMOVAL OF ENDOMETRIAL POLYP);  Surgeon: Jonnie Kind, MD;  Location: [facility];  Service: Gynecology;  Laterality: N/A;   TUBAL LIGATION      There were no vitals filed for this visit.   Subjective Assessment - 11/11/20 1438     Subjective Knee is feeling better, some swelling in left knee and lower leg persists and notes intermittent popping experienced in left medial knee    Currently in Pain? No/denies    Pain Score 0-No pain    Pain Location Knee    Pain Orientation Left                [facility] PT Assessment - 11/11/20 0001  Assessment   Medical Diagnosis left knee scope    Referring Provider (PT) Frederik Pear    Onset Date/Surgical Date 09/15/20                           [facility] Adult PT Treatment/Exercise - 11/11/20 0001       Knee/Hip Exercises: Aerobic   Nustep level 3 x 5 min for dynamic warm-up      Knee/Hip Exercises: Standing   Knee Flexion Strengthening;Left;2 sets;10 reps    Knee Flexion Limitations green t-loop    Other Standing Knee Exercises sidestepping with green t-loop x 2 min      Knee/Hip Exercises: Seated   Long Arc Quad 2 sets;10 reps    Long Arc Quad Limitations green t-band    Sit to [employer] 2 sets;5 reps;without UE support   with green loop around  knees                   PT Education - 11/11/20 1450     Education Details education/demonstration in HEP additions with use of green t-loop    Person(s) Educated Patient    Methods Explanation    Comprehension Verbalized understanding              PT Short Term Goals - 10/28/20 1428       PT SHORT TERM GOAL #1   Title Patient will report at least 50% improvement in overall symptoms and/or function to demonstrate improved functional mobility    Baseline 100% better    Time 3    Period Weeks    Status Achieved    Target Date 11/04/20      PT SHORT TERM GOAL #2   Title Patient will be independent in self management strategies to improve quality of life and functional outcomes.    Time 3    Period Weeks    Status On-going    Target Date 11/04/20      PT SHORT TERM GOAL #3   Title Patient will be able to demonstrate 2-125 degrees of active left knee ROM    Time 3    Period Weeks    Status On-going    Target Date 11/04/20               PT Long Term Goals - 10/28/20 1428       PT LONG TERM GOAL #1   Title Patient will be able to ascend and descend stairs without railing and reciprocal gait pattern    Time 8    Period Weeks    Status On-going      PT LONG TERM GOAL #2   Title Patient will improve on FOTO score to meet predicted outcomes to demonstrate improved functional mobility.    Time 8    Period Weeks    Status On-going      PT LONG TERM GOAL #3   Title Patient will report at least 75% improvement in overall symptoms and/or function to demonstrate improved functional mobility    Time 8    Period Weeks    Status On-going                   Plan - 11/11/20 1453     Clinical Impression Statement Tolerating tx sessions very well and able to add green t-band resistance to open chain strengthening activities to improve strength and activity tolerance.  Continued POC indicated to progress HEP and  strength development to improve  functional activity tolerance and facilitate knee stability during strenuous activities    Personal Factors and Comorbidities Comorbidity 1    Comorbidities HTN    Examination-Activity Limitations Bathing;Sleep;Squat;Stairs;Stand;Transfers;Locomotion Level;Lift;Carry    Examination-Participation Restrictions Cleaning;Driving;Meal Prep;Yard Work;Shop;Community Activity    Rehab Potential Excellent    PT Frequency 1x / week    PT Duration 8 weeks    PT Treatment/Interventions ADLs/Self Care Home Management;Balance training;Therapeutic exercise;Therapeutic activities;Functional mobility training;Stair training;Gait training;DME Instruction;Neuromuscular re-education;Electrical Stimulation;Cryotherapy;Passive range of motion;Dry needling    PT Next Visit Plan quad strength, knee ROM, functional strength as tolerated, gait mechanics, mini-squats    PT Home Exercise Plan heel slides, knee flexion seated, quad sets, hamstring stretch, prone hamstring curls, prone quad stretch; 6/21 STS (elevated), bridge, Step up 4 inch; LAQ, H/S curls, sit to stand, sidestepping with green t-loop    Consulted and Agree with Plan of Care Patient             Patient will benefit from skilled therapeutic intervention in order to improve the following deficits and impairments:  Pain, Difficulty walking, Decreased range of motion, Increased edema, Decreased endurance, Decreased activity tolerance, Decreased balance  Visit Diagnosis: Muscle weakness (generalized)  Acute pain of left knee  Decreased range of motion (ROM) of left knee  Difficulty in walking, not elsewhere classified     Problem List Patient Active Problem List   Diagnosis Date Noted   Hyperlipidemia    Hypertension    Abdominal aortic aneurysm (AAA) 3.0 cm to 5.0 cm in diameter in female [facility])    Nausea without vomiting 12/06/2018   Loss of weight 12/06/2018   Controlled diabetes mellitus type 2 with complications ([place]) 50/27/7412    Essential hypertension 04/25/2017   Hyperlipidemia LDL goal <70 04/25/2017   Carotid artery stenosis without cerebral infarction, bilateral 04/25/2017   AAA (abdominal aortic aneurysm) without rupture ([place]) 04/25/2017   History of colonic polyps    Diverticulosis of colon without hemorrhage    RUQ pain 02/21/2013   Abdominal pain, epigastric 01/05/2013   Abdominal bruit 01/05/2013   Esophageal dysphagia 01/05/2013   Constipation 01/05/2013   GERD (gastroesophageal reflux disease) 01/05/2013   3:11 PM, 11/11/20 M. Sherlyn Lees, PT, DPT Physical Therapist- [place] Office Number: [phone]   [facility] [address] [place], [place], [address] Phone: [phone]   Fax:  [phone]  Name: ANDRIKA PERAZA MRN: [id number] Date of Birth: January 21, 1956

## 2020-11-18 ENCOUNTER — Ambulatory Visit (HOSPITAL_COMMUNITY): Payer: Medicare Other | Admitting: Physical Therapy

## 2020-11-18 ENCOUNTER — Ambulatory Visit: Payer: Medicare Other | Admitting: Gastroenterology

## 2020-11-19 ENCOUNTER — Telehealth: Payer: Self-pay | Admitting: *Deleted

## 2020-11-19 NOTE — Telephone Encounter (Signed)
Received VM from patient.   Reports that she was taking Topamax 50mg  PO BID for HA.   Reports that she noticed she was having numbness to her face, arms, hands and feet. Reports that she stopped taking Topamax on 11/17/2020. States that tingling/ numbness has since resolved.   PCP to be made aware.

## 2020-11-20 NOTE — Telephone Encounter (Signed)
Patient is requesting alternative to Topamax for chronic HA.

## 2020-11-21 ENCOUNTER — Other Ambulatory Visit: Payer: Self-pay

## 2020-11-21 MED ORDER — GABAPENTIN 100 MG PO CAPS: 100.0000 mg | ORAL_CAPSULE | Freq: Three times a day (TID) | ORAL | 0 refills | Status: DC

## 2020-11-21 NOTE — Telephone Encounter (Signed)
Call placed to patient and patient made aware.  

## 2020-11-21 NOTE — Telephone Encounter (Signed)
Call placed to patient. LMTRC.  

## 2020-11-25 ENCOUNTER — Encounter (HOSPITAL_COMMUNITY): Payer: Medicare Other | Admitting: Physical Therapy

## 2020-12-02 ENCOUNTER — Other Ambulatory Visit: Payer: Self-pay | Admitting: Gastroenterology

## 2020-12-02 ENCOUNTER — Ambulatory Visit (HOSPITAL_COMMUNITY): Payer: Medicare Other | Admitting: Physical Therapy

## 2020-12-02 ENCOUNTER — Other Ambulatory Visit: Payer: Self-pay

## 2020-12-02 ENCOUNTER — Encounter (HOSPITAL_COMMUNITY): Payer: Self-pay | Admitting: Physical Therapy

## 2020-12-02 DIAGNOSIS — R262 Difficulty in walking, not elsewhere classified: Secondary | ICD-10-CM

## 2020-12-02 DIAGNOSIS — M6281 Muscle weakness (generalized): Secondary | ICD-10-CM | POA: Diagnosis not present

## 2020-12-02 DIAGNOSIS — R110 Nausea: Secondary | ICD-10-CM

## 2020-12-02 DIAGNOSIS — M25562 Pain in left knee: Secondary | ICD-10-CM | POA: Diagnosis not present

## 2020-12-02 DIAGNOSIS — M25662 Stiffness of left knee, not elsewhere classified: Secondary | ICD-10-CM

## 2020-12-02 DIAGNOSIS — K219 Gastro-esophageal reflux disease without esophagitis: Secondary | ICD-10-CM

## 2020-12-02 NOTE — Therapy (Signed)
[facility] [address] [place], [place], [address] Phone: [phone]   Fax:  [phone]  Physical Therapy Treatment and Discharge Note  Patient Details  Name: [name] MRN: [id number] Date of Birth: 27-Jun-1955 Referring Provider (PT): Frederik Pear  PHYSICAL THERAPY DISCHARGE SUMMARY  Visits from Start of Care: 6  Current functional level related to goals / functional outcomes: See below   Remaining deficits: See below   Education / Equipment: See below   Patient agrees to discharge. Patient goals were met. Patient is being discharged due to meeting the stated rehab goals.   Encounter Date: 12/02/2020   PT End of Session - 12/02/20 1316     Visit Number 6    Number of Visits 8    Date for PT Re-Evaluation 12/09/20    Authorization Type medicare no VL    Progress Note Due on Visit 10    PT Start Time 1319    PT Stop Time 1353    PT Time Calculation (min) 34 min    Activity Tolerance Patient tolerated treatment well    Behavior During Therapy WFL for tasks assessed/performed             Past Medical History:  Diagnosis Date   Abdominal aortic aneurysm (AAA) 3.0 cm to 5.0 cm in diameter in female [facility])    None seen on prior imaging?    Anxiety    Cataract    Chronic left shoulder pain    Headache    Hiatal hernia    small   Hyperlipidemia    Hypertension    Pre-diabetes    Schatzki's ring    Seasonal allergies    Tremor     Past Surgical History:  Procedure Laterality Date   CARDIAC CATHETERIZATION  2005   "[name] has essentially normal coronary arteries and normal left ventricular function.  She will be treated empirically with anti-reflux measures"   CATARACT EXTRACTION, BILATERAL     CHOLECYSTECTOMY     COLONOSCOPY    06/16/2004   GLO:VFIEPP rectum/Diminutive polyps of the splenic flexure and the cecum/The remainder of the colonic mucosa appeared normal pathology (hyperplastic  polyps).   COLONOSCOPY N/A 08/07/2015   Dr. Gala Romney: 6 mm descending colon tubular adenoma, multiple medium-mouthed diverticula in sigmoid colon. surveillance 2022   ESOPHAGOGASTRODUODENOSCOPY  09/11/2001   IRJ:JOACZYSAY ring otherwise normal esophagus/Normal stomach/Normal D1 and D2 status post passage of a 56 French Maloney dilator   ESOPHAGOGASTRODUODENOSCOPY N/A 11/11/2017   Procedure: ESOPHAGOGASTRODUODENOSCOPY (EGD);  Surgeon: Daneil Dolin, MD; mild Schatzki ring s/p dilation and mild erosive gastropathy.   ESOPHAGOGASTRODUODENOSCOPY (EGD) WITH ESOPHAGEAL DILATION N/A 01/25/2013   Dr. Gala Romney- schatzki's ring- 53 F dilation. small hiatal hernia   HYSTEROSCOPY WITH D & C N/A 03/06/2019   Procedure: DILATATION AND CURETTAGE /HYSTEROSCOPY;  Surgeon: Jonnie Kind, MD;  Location: [facility];  Service: Gynecology;  Laterality: N/A;   MALONEY DILATION N/A 11/11/2017   Procedure: [name] DILATION;  Surgeon: Daneil Dolin, MD;  [facility];  Service: Endoscopy;  Laterality: N/A;   POLYPECTOMY N/A 03/06/2019   Procedure: POLYPECTOMY ( REMOVAL OF ENDOMETRIAL POLYP);  Surgeon: Jonnie Kind, MD;  Location: [facility];  Service: Gynecology;  Laterality: N/A;   TUBAL LIGATION      There were no vitals filed for this visit.   Subjective Assessment - 12/02/20 1337     Subjective States that she is still having a little pressure when  she is walking up the steps, states she is squatting down low without real difficulty. States that she gets minor pain in the morning but none right now. States that she feels about 98% better since the start of PT.    Currently in Pain? No/denies                [facility] PT Assessment - 12/02/20 0001       Assessment   Medical Diagnosis left knee scope    Referring Provider (PT) Frederik Pear    Onset Date/Surgical Date 09/15/20      Observation/Other Assessments   Focus on Therapeutic Outcomes (FOTO)  72% function   predicted was 61% function     AROM    Left Knee Extension 2   lacking   Left Knee Flexion 129      Strength   Strength Assessment Site Ankle    Right/Left Ankle Right;Left    Right Ankle Plantar Flexion --   10 single leg heel raises   Left Ankle Plantar Flexion --   0 single leg heel raises     Ambulation/Gait   Stairs Yes    Stairs Assistance 7: Independent    Stair Management Technique No rails    Number of Stairs 4    Height of Stairs 7                           [facility] Adult PT Treatment/Exercise - 12/02/20 0001       Knee/Hip Exercises: Stretches   Passive Hamstring Stretch Left;3 reps;30 seconds    Gastroc Stretch Both;3 reps;30 seconds   at steps     Knee/Hip Exercises: Standing   Heel Raises Both;2 sets;5 reps   eccentric 2 up 1 down, slow lower                   PT Education - 12/02/20 1337     Education Details on FOTO score, on current presentation on HEP and plan moving forward, on anatomy    Person(s) Educated Patient    Methods Explanation    Comprehension Verbalized understanding              PT Short Term Goals - 12/02/20 1330       PT SHORT TERM GOAL #1   Title Patient will report at least 50% improvement in overall symptoms and/or function to demonstrate improved functional mobility    Baseline 100% better    Time 3    Period Weeks    Status Achieved    Target Date 11/04/20      PT SHORT TERM GOAL #2   Title Patient will be independent in self management strategies to improve quality of life and functional outcomes.    Time 3    Period Weeks    Status Achieved    Target Date 11/04/20      PT SHORT TERM GOAL #3   Title Patient will be able to demonstrate 2-125 degrees of active left knee ROM    Baseline 2-129    Time 3    Period Weeks    Status Achieved    Target Date 11/04/20               PT Long Term Goals - 12/02/20 1324       PT LONG TERM GOAL #1   Title Patient will be able to ascend and descend stairs without railing and  reciprocal  gait pattern    Time 8    Period Weeks    Status Achieved      PT LONG TERM GOAL #2   Title Patient will improve on FOTO score to meet predicted outcomes to demonstrate improved functional mobility.    Time 8    Period Weeks    Status Achieved      PT LONG TERM GOAL #3   Title Patient will report at least 75% improvement in overall symptoms and/or function to demonstrate improved functional mobility    Time 8    Period Weeks    Status Achieved                   Plan - 12/02/20 1354     Clinical Impression Statement All short and long term goals met at this time. Reviewed HEP and answered all questions. Added Stretches and exercises to HEP. Patient to discharge from PT to HEP secondary to progress made and independence in HEP.    Personal Factors and Comorbidities Comorbidity 1    Comorbidities HTN    Examination-Activity Limitations Bathing;Sleep;Squat;Stairs;Stand;Transfers;Locomotion Level;Lift;Carry    Examination-Participation Restrictions Cleaning;Driving;Meal Prep;Yard Work;Shop;Community Activity    Rehab Potential Excellent    PT Frequency 1x / week    PT Duration 8 weeks    PT Treatment/Interventions ADLs/Self Care Home Management;Balance training;Therapeutic exercise;Therapeutic activities;Functional mobility training;Stair training;Gait training;DME Instruction;Neuromuscular re-education;Electrical Stimulation;Cryotherapy;Passive range of motion;Dry needling    PT Next Visit Plan DC to HEP    PT Home Exercise Plan heel slides, knee flexion seated, quad sets, hamstring stretch, prone hamstring curls, prone quad stretch; 6/21 STS (elevated), bridge, Step up 4 inch; LAQ, H/S curls, sit to stand, sidestepping with green t-loop; heel raise eccentric, hamstring and calf stretch    Consulted and Agree with Plan of Care Patient             Patient will benefit from skilled therapeutic intervention in order to improve the following deficits and  impairments:  Pain, Difficulty walking, Decreased range of motion, Increased edema, Decreased endurance, Decreased activity tolerance, Decreased balance  Visit Diagnosis: Muscle weakness (generalized)  Acute pain of left knee  Decreased range of motion (ROM) of left knee  Difficulty in walking, not elsewhere classified     Problem List Patient Active Problem List   Diagnosis Date Noted   Hyperlipidemia    Hypertension    Abdominal aortic aneurysm (AAA) 3.0 cm to 5.0 cm in diameter in female [facility])    Nausea without vomiting 12/06/2018   Loss of weight 12/06/2018   Controlled diabetes mellitus type 2 with complications ([place]) 62/70/3500   Essential hypertension 04/25/2017   Hyperlipidemia LDL goal <70 04/25/2017   Carotid artery stenosis without cerebral infarction, bilateral 04/25/2017   AAA (abdominal aortic aneurysm) without rupture ([place]) 04/25/2017   History of colonic polyps    Diverticulosis of colon without hemorrhage    RUQ pain 02/21/2013   Abdominal pain, epigastric 01/05/2013   Abdominal bruit 01/05/2013   Esophageal dysphagia 01/05/2013   Constipation 01/05/2013   GERD (gastroesophageal reflux disease) 01/05/2013    1:57 PM, 12/02/20 Jerene Pitch, DPT Physical Therapy with [facility]  [phone] office   [facility] [address] [place], [place], [address] Phone: [phone]   Fax:  [phone]  Name: [name] MRN: [id number] Date of Birth: [birth year]

## 2020-12-09 ENCOUNTER — Ambulatory Visit (HOSPITAL_COMMUNITY): Payer: Medicare Other | Admitting: Physical Therapy

## 2020-12-10 DIAGNOSIS — J3489 Other specified disorders of nose and nasal sinuses: Secondary | ICD-10-CM | POA: Diagnosis not present

## 2020-12-10 DIAGNOSIS — J301 Allergic rhinitis due to pollen: Secondary | ICD-10-CM | POA: Diagnosis not present

## 2020-12-10 DIAGNOSIS — J329 Chronic sinusitis, unspecified: Secondary | ICD-10-CM | POA: Diagnosis not present

## 2020-12-11 DIAGNOSIS — E119 Type 2 diabetes mellitus without complications: Secondary | ICD-10-CM | POA: Diagnosis not present

## 2020-12-15 ENCOUNTER — Other Ambulatory Visit (HOSPITAL_COMMUNITY): Payer: Self-pay | Admitting: Family Medicine

## 2020-12-15 ENCOUNTER — Other Ambulatory Visit: Payer: Self-pay | Admitting: Family Medicine

## 2020-12-15 DIAGNOSIS — F419 Anxiety disorder, unspecified: Secondary | ICD-10-CM

## 2020-12-15 DIAGNOSIS — Z1231 Encounter for screening mammogram for malignant neoplasm of breast: Secondary | ICD-10-CM

## 2020-12-15 NOTE — Telephone Encounter (Signed)
Ok to refill??  Last office visit 10/14/2020.  Last refill 05/06/2020.

## 2020-12-18 ENCOUNTER — Other Ambulatory Visit: Payer: Self-pay | Admitting: Nurse Practitioner

## 2020-12-18 ENCOUNTER — Other Ambulatory Visit: Payer: Self-pay | Admitting: Family Medicine

## 2020-12-18 ENCOUNTER — Telehealth: Payer: Self-pay | Admitting: Pharmacist

## 2020-12-18 DIAGNOSIS — R1013 Epigastric pain: Secondary | ICD-10-CM

## 2020-12-18 DIAGNOSIS — K219 Gastro-esophageal reflux disease without esophagitis: Secondary | ICD-10-CM

## 2020-12-18 NOTE — Progress Notes (Addendum)
Chronic Care Management Pharmacy Assistant   Name: [name]  MRN: [id number] DOB: 06/05/1955  Reason for Encounter: Disease State For DM & HTN.    Conditions to be addressed/monitored: Hypertension, GERD, Type II DM, Hyperlipidemia.  Recent office visits: 11/19/20 Telephone Dr. Dennard Schaumann STOPPED Topiramate Per note: Dr. Dennard Schaumann stated try gabapentin 100 mg potid.    10/28/20 Dr. Dennard Schaumann For sinus pressure. STARTED Topiramate 50 mg 2 times daily. STOPPED Doxazosin.   08/19/20 Dr. Dennard Schaumann For ER follow-up. No medication changes.  08/01/20 Telemedicine Eulogio Bear, NP. For follow-up. STOPPED Hydrochlorothiazide.  06/30/20 Dr. Dennard Schaumann For follow-up. STARTED Ezetimibe 10 mg daily.  06/10/20 Dr. Dennard Schaumann For joint pain. No medication changes.   Recent consult visits:  07/24/20 Family Med Gentry Fitz. For left knee pain. No information changes.  07/03/20 Erenest Rasher, PA-C.For abdominal pain. STARTED Esomeprazole 40 mg 2 times daily. STOPPED Pantoprazole.  07/02/20 Vasc Surg Elam Dutch, MD. For follow-up. No medication changes.  07/01/20 Family Med [place], Dominic W. For left knee pain. No information changes.  06/26/20 Neurology Tat, Eustace Quail, DO. For follow-up. No medication changes. 06/20/20 Cardiology Josue Hector, MD. STOPPED Diclofenac Sodium and Doxazosin.  06/16/20 Sports Med Berle Mull For right knee pain.  06/11/20 Family Med [place], Dominic W. For left knee pain. No information changes.   Hospital visits:  08/18/20 [facility] (7 Hours) Varney Biles, MD. For palpitations. No medication changes.  07/31/20 [facility] Department (7 Hours) Davonna Belling, MD. For discomfort of let eye. STARTED Amoxicillin-Pot-Clavulanate 875-125 mg 1 tablet every 12 hours.   Medications: Outpatient Encounter Medications as of 12/18/2020  Medication Sig   ALPRAZolam (XANAX) 0.5 MG tablet TAKE 1 TABLET(0.5 MG) BY  MOUTH TWICE DAILY AS NEEDED FOR ANXIETY   acetaminophen (TYLENOL) 500 MG tablet Take 500 mg by mouth every 6 (six) hours as needed for moderate pain or headache.   amLODipine (NORVASC) 10 MG tablet TAKE 1 TABLET(10 MG) BY MOUTH AT BEDTIME   aspirin EC 81 MG tablet Take 81 mg by mouth at bedtime.    cholecalciferol (VITAMIN D3) 25 MCG (1000 UT) tablet Take 2,000 Units by mouth daily.   Cyanocobalamin (B-12) 2500 MCG TABS Take 2,500 mcg by mouth daily.   EPINEPHrine 0.3 mg/0.3 mL IJ SOAJ injection Inject 0.3 mg into the muscle as needed for anaphylaxis.    fluticasone (FLONASE) 50 MCG/ACT nasal spray SHAKE LIQUID AND USE 2 SPRAYS IN EACH NOSTRIL DAILY (Patient taking differently: Place 2 sprays into both nostrils daily.)   gabapentin (NEURONTIN) 100 MG capsule Take 1 capsule (100 mg total) by mouth 3 (three) times daily.   hydrochlorothiazide (HYDRODIURIL) 25 MG tablet TAKE 1 TABLET(25 MG) BY MOUTH DAILY   hydrOXYzine (ATARAX/VISTARIL) 25 MG tablet Take 0.5-1 tablets (12.5-25 mg total) by mouth every 8 (eight) hours as needed for itching.   ipratropium (ATROVENT) 0.03 % nasal spray INSTILL 2 SPRAYS IN EACH NOSTRIL EVERY 12 HOURS   levocetirizine (XYZAL) 5 MG tablet Take 5 mg by mouth every evening.   LINZESS 145 MCG CAPS capsule TAKE 1 CAPSULE(145 MCG) BY MOUTH DAILY BEFORE BREAKFAST (Patient taking differently: Take 145 mcg by mouth daily before breakfast.)   loratadine (CLARITIN) 10 MG tablet Take 10 mg by mouth daily as needed for allergies.   MAGNESIUM PO Take 330 mg by mouth daily.   metoprolol succinate (TOPROL-XL) 100 MG 24 hr tablet Take 1 tablet (100 mg total) by mouth  daily.   mometasone (ELOCON) 0.1 % cream Apply 1 application topically daily as needed (for irritation).    montelukast (SINGULAIR) 10 MG tablet TAKE 1 TABLET(10 MG) BY MOUTH AT BEDTIME (Patient taking differently: Take 10 mg by mouth at bedtime.)   omeprazole (PRILOSEC) 40 MG capsule Take 1 capsule (40 mg total) by mouth 2  (two) times daily before a meal.   ondansetron (ZOFRAN) 4 MG tablet Take 1 tablet (4 mg total) by mouth every 8 (eight) hours as needed for nausea or vomiting.   potassium chloride SA (KLOR-CON) 20 MEQ tablet TAKE 1 TABLET(20 MEQ) BY MOUTH DAILY (Patient taking differently: Take 20 mEq by mouth daily.)   rosuvastatin (CRESTOR) 5 MG tablet TAKE 1 TABLET(5 MG) BY MOUTH DAILY   sodium chloride (OCEAN) 0.65 % SOLN nasal spray Place 1 spray into both nostrils as needed for congestion.   sucralfate (CARAFATE) 1 g tablet TAKE 1 TABLET(1 GRAM) BY MOUTH FOUR TIMES DAILY WITH MEALS AND AT BEDTIME   tacrolimus (PROTOPIC) 0.1 % ointment Apply 1 application topically daily as needed (for irritation).    triamcinolone cream (KENALOG) 0.1 % APPLY EXTERNALLY TO THE AFFECTED AREA TWICE DAILY (Patient taking differently: Apply 1 application topically 2 (two) times daily as needed (Rash).)   valsartan (DIOVAN) 320 MG tablet TAKE 1 TABLET BY MOUTH EVERY DAY   vitamin C (ASCORBIC ACID) 250 MG tablet Take 500 mg by mouth daily.   vitamin E 400 UNIT capsule Take 400 Units by mouth daily.   zinc gluconate 50 MG tablet Take 50 mg by mouth daily.   No facility-administered encounter medications on file as of 12/18/2020.   Recent Relevant Labs: Lab Results  Component Value Date/Time   HGBA1C 5.8 (H) 09/13/2019 02:39 PM   HGBA1C 6.6 (H) 03/02/2019 01:26 PM   MICROALBUR 0.2 09/13/2019 02:39 PM   MICROALBUR 0.3 06/27/2018 08:48 AM    Kidney Function Lab Results  Component Value Date/Time   CREATININE 0.79 08/18/2020 05:37 PM   CREATININE 0.79 07/31/2020 08:57 PM   CREATININE 0.93 06/10/2020 03:41 PM   CREATININE 0.87 10/29/2019 11:57 AM   GFRNONAA >60 08/18/2020 05:37 PM   GFRNONAA 64 06/10/2020 03:41 PM   GFRAA 74 06/10/2020 03:41 PM    Current antihyperglycemic regimen:  None.   What recent interventions/DTPs have been made to improve glycemic control:  None.   Have there been any recent hospitalizations  or ED visits since last visit with CPP? Yes, documented above.   Patient reports hypoglycemic symptoms, including None  Patient denies hyperglycemic symptoms, including none  How often are you checking your blood sugar? Patient stated once daily  What are your blood sugars ranging?  Patient stated her blood sugar stays within normal range most of the time but sometimes it drops in the early morning around 70 because of her eating habits.    During the week, how often does your blood glucose drop below 70? Patient stated her blood sugars drop around 70 at least once every two weeks.  Are you checking your feet daily/regularly?  Patient stated she checks her feet regularly.   Adherence Review: Is the patient currently on a STATIN medication? Rosuvastatin 5 mg   Is the patient currently on ACE/ARB medication? Valsartan 320 mg  Does the patient have >5 day gap between last estimated fill dates? Per misc rpts, no.   Reviewed chart prior to disease state call. Spoke with patient regarding BP  Recent Office Vitals: BP Readings from Last  3 Encounters:  10/28/20 (!) 142/62  08/19/20 (!) 148/80  08/19/20 138/76   Pulse Readings from Last 3 Encounters:  10/28/20 70  08/19/20 74  08/19/20 60    Wt Readings from Last 3 Encounters:  10/28/20 171 lb (77.6 kg)  08/19/20 175 lb (79.4 kg)  08/18/20 172 lb (78 kg)     Kidney Function Lab Results  Component Value Date/Time   CREATININE 0.79 08/18/2020 05:37 PM   CREATININE 0.79 07/31/2020 08:57 PM   CREATININE 0.93 06/10/2020 03:41 PM   CREATININE 0.87 10/29/2019 11:57 AM   GFRNONAA >60 08/18/2020 05:37 PM   GFRNONAA 64 06/10/2020 03:41 PM   GFRAA 74 06/10/2020 03:41 PM    BMP Latest Ref Rng & Units 08/18/2020 07/31/2020 06/10/2020  Glucose 70 - 99 mg/dL 157(H) 89 72  BUN 8 - 23 mg/dL 10 7(L) 9  Creatinine 0.44 - 1.00 mg/dL 0.79 0.79 0.93  BUN/Creat Ratio 6 - 22 (calc) - - NOT APPLICABLE  Sodium A999333 - 145 mmol/L 136 137 138   Potassium 3.5 - 5.1 mmol/L 3.3(L) 3.3(L) 3.7  Chloride 98 - 111 mmol/L 101 100 100  CO2 22 - 32 mmol/L '26 30 29  '$ Calcium 8.9 - 10.3 mg/dL 9.1 9.9 9.7    Current antihypertensive regimen:  Valsartan 320 mg 1 tablet daily  Hydrochlorothiazide 25 mg 1 tablet daily (on HOLD for now)  Amlodipine 10 mg 1 tablet daily  Metoprolol 100 mg 1 tablet daily  How often are you checking your Blood Pressure? Patient stated daily  Current home BP readings: Patient stated her blood pressure range around 123-123/65 .  What recent interventions/DTPs have been made by any provider to improve Blood Pressure control since last CPP Visit: None.   Any recent hospitalizations or ED visits since last visit with CPP? Yes, documented above.   What diet changes have been made to improve Blood Pressure Control?  Patient stated she eats about two times a day, she stated she has a small appetite.  What exercise is being done to improve your Blood Pressure Control?  Patient stated she does her daily duties around the house and makes most of her meals. Patient stated she does her physical therapy exercises.   Adherence Review: Is the patient currently on ACE/ARB medication? Valsartan 320 mg  Does the patient have >5 day gap between last estimated fill dates? Per misc rpts, no.  Star Rating Drugs: Valsartan 320 mg 90 DS 11/05/20, Rosuvastatin 5 mg 90 DS 12/08/20.   Follow-Up:Pharmacist Review  Charlann Lange, RMA Clinical Pharmacist Assistant [phone]  8 minutes spent in review, coordination, and documentation.  Reviewed by: [name], PharmD Clinical Pharmacist [phone]

## 2020-12-18 NOTE — Telephone Encounter (Signed)
Per chart, patient should be taking omeprazole at this time, not pantoprazole.  Please confirm.

## 2020-12-19 NOTE — Telephone Encounter (Signed)
Called pt.  Number not is service.  Called daughter.  No voice mail set up.  Called [facility] and spoke to [place].  He informed me that pt picked up RX for pantoprazole #180 on 12/17/2020.  He said that her hx showed omeprazole #180 last picked up Apr 2022 and prior to that pantoprazole #180 on 12/21/2019.  Informed him that pt should be taking omeprazole and not pantoprazole.  He didn't have any alternative phone numbers on file to reach her either.

## 2020-12-22 ENCOUNTER — Ambulatory Visit (HOSPITAL_COMMUNITY)
Admission: RE | Admit: 2020-12-22 | Discharge: 2020-12-22 | Disposition: A | Discharge status: Home / Self Care | Payer: Medicare Other | Source: Ambulatory Visit | Attending: Family Medicine | Admitting: Family Medicine

## 2020-12-22 ENCOUNTER — Other Ambulatory Visit: Payer: Self-pay

## 2020-12-22 DIAGNOSIS — Z1231 Encounter for screening mammogram for malignant neoplasm of breast: Secondary | ICD-10-CM | POA: Diagnosis not present

## 2020-12-25 NOTE — Telephone Encounter (Signed)
Noted. Will hold off on refilling.

## 2020-12-30 DIAGNOSIS — L818 Other specified disorders of pigmentation: Secondary | ICD-10-CM | POA: Diagnosis not present

## 2020-12-30 DIAGNOSIS — L258 Unspecified contact dermatitis due to other agents: Secondary | ICD-10-CM | POA: Diagnosis not present

## 2021-01-05 DIAGNOSIS — L258 Unspecified contact dermatitis due to other agents: Secondary | ICD-10-CM | POA: Diagnosis not present

## 2021-01-20 ENCOUNTER — Other Ambulatory Visit: Payer: Medicare Other

## 2021-01-20 ENCOUNTER — Other Ambulatory Visit: Payer: Self-pay

## 2021-01-20 DIAGNOSIS — E118 Type 2 diabetes mellitus with unspecified complications: Secondary | ICD-10-CM

## 2021-01-20 DIAGNOSIS — E785 Hyperlipidemia, unspecified: Secondary | ICD-10-CM

## 2021-01-20 DIAGNOSIS — I10 Essential (primary) hypertension: Secondary | ICD-10-CM | POA: Diagnosis not present

## 2021-01-20 DIAGNOSIS — E539 Vitamin B deficiency, unspecified: Secondary | ICD-10-CM

## 2021-01-20 DIAGNOSIS — Z136 Encounter for screening for cardiovascular disorders: Secondary | ICD-10-CM

## 2021-01-20 DIAGNOSIS — R6889 Other general symptoms and signs: Secondary | ICD-10-CM | POA: Diagnosis not present

## 2021-01-20 DIAGNOSIS — E559 Vitamin D deficiency, unspecified: Secondary | ICD-10-CM | POA: Diagnosis not present

## 2021-01-20 DIAGNOSIS — Z13220 Encounter for screening for lipoid disorders: Secondary | ICD-10-CM

## 2021-01-21 LAB — CBC WITH DIFFERENTIAL/PLATELET
Absolute Monocytes: 329 cells/uL (ref 200–950)
Basophils Absolute: 31 cells/uL (ref 0–200)
Basophils Relative: 0.5 %
Eosinophils Absolute: 192 cells/uL (ref 15–500)
Eosinophils Relative: 3.1 %
HCT: 40.1 % (ref 35.0–45.0)
Hemoglobin: 13 g/dL (ref 11.7–15.5)
Lymphs Abs: 3410 cells/uL (ref 850–3900)
MCH: 29.7 pg (ref 27.0–33.0)
MCHC: 32.4 g/dL (ref 32.0–36.0)
MCV: 91.6 fL (ref 80.0–100.0)
MPV: 10.4 fL (ref 7.5–12.5)
Monocytes Relative: 5.3 %
Neutro Abs: 2238 cells/uL (ref 1500–7800)
Neutrophils Relative %: 36.1 %
Platelets: 252 10*3/uL (ref 140–400)
RBC: 4.38 10*6/uL (ref 3.80–5.10)
RDW: 12.6 % (ref 11.0–15.0)
Total Lymphocyte: 55 %
WBC: 6.2 10*3/uL (ref 3.8–10.8)

## 2021-01-21 LAB — HEMOGLOBIN A1C
Hgb A1c MFr Bld: 5.9 % of total Hgb — ABNORMAL HIGH (ref <5.7)
Mean Plasma Glucose: 123 mg/dL
eAG (mmol/L): 6.8 mmol/L

## 2021-01-21 LAB — COMPLETE METABOLIC PANEL WITH GFR
AG Ratio: 1.7 (calc) (ref 1.0–2.5)
ALT: 12 U/L (ref 6–29)
AST: 17 U/L (ref 10–35)
Albumin: 4.3 g/dL (ref 3.6–5.1)
Alkaline phosphatase (APISO): 75 U/L (ref 37–153)
BUN: 11 mg/dL (ref 7–25)
CO2: 29 mmol/L (ref 20–32)
Calcium: 9.8 mg/dL (ref 8.6–10.4)
Chloride: 104 mmol/L (ref 98–110)
Creat: 0.79 mg/dL (ref 0.50–1.05)
Globulin: 2.5 g/dL (calc) (ref 1.9–3.7)
Glucose, Bld: 108 mg/dL — ABNORMAL HIGH (ref 65–99)
Potassium: 4.4 mmol/L (ref 3.5–5.3)
Sodium: 141 mmol/L (ref 135–146)
Total Bilirubin: 0.7 mg/dL (ref 0.2–1.2)
Total Protein: 6.8 g/dL (ref 6.1–8.1)
eGFR: 82 mL/min/{1.73_m2} (ref >=60)

## 2021-01-21 LAB — LIPID PANEL
Cholesterol: 163 mg/dL (ref <200)
HDL: 60 mg/dL (ref >=50)
LDL Cholesterol (Calc): 89 mg/dL (calc)
Non-HDL Cholesterol (Calc): 103 mg/dL (calc) (ref <130)
Total CHOL/HDL Ratio: 2.7 (calc) (ref <5.0)
Triglycerides: 62 mg/dL (ref <150)

## 2021-01-21 LAB — MICROALBUMIN / CREATININE URINE RATIO
Creatinine, Urine: 47 mg/dL (ref 20–275)
Microalb Creat Ratio: 11 mcg/mg creat (ref <30)
Microalb, Ur: 0.5 mg/dL

## 2021-01-21 LAB — VITAMIN D 25 HYDROXY (VIT D DEFICIENCY, FRACTURES): Vit D, 25-Hydroxy: 38 ng/mL (ref 30–100)

## 2021-01-21 LAB — VITAMIN B12: Vitamin B-12: >2000 pg/mL — ABNORMAL HIGH (ref 200–1100)

## 2021-02-05 ENCOUNTER — Other Ambulatory Visit: Payer: Self-pay

## 2021-02-05 ENCOUNTER — Ambulatory Visit (INDEPENDENT_AMBULATORY_CARE_PROVIDER_SITE_OTHER): Payer: Medicare Other | Admitting: Family Medicine

## 2021-02-05 VITALS — BP 148/64 | HR 63 | Temp 97.2°F | Ht 67.0 in | Wt 171.0 lb

## 2021-02-05 DIAGNOSIS — Z23 Encounter for immunization: Secondary | ICD-10-CM

## 2021-02-05 DIAGNOSIS — Z0000 Encounter for general adult medical examination without abnormal findings: Secondary | ICD-10-CM | POA: Diagnosis not present

## 2021-02-05 DIAGNOSIS — G720 Drug-induced myopathy: Secondary | ICD-10-CM

## 2021-02-05 DIAGNOSIS — I10 Essential (primary) hypertension: Secondary | ICD-10-CM

## 2021-02-05 DIAGNOSIS — I6523 Occlusion and stenosis of bilateral carotid arteries: Secondary | ICD-10-CM

## 2021-02-05 DIAGNOSIS — G250 Essential tremor: Secondary | ICD-10-CM

## 2021-02-05 DIAGNOSIS — R7303 Prediabetes: Secondary | ICD-10-CM

## 2021-02-05 DIAGNOSIS — Z780 Asymptomatic menopausal state: Secondary | ICD-10-CM

## 2021-02-05 DIAGNOSIS — E785 Hyperlipidemia, unspecified: Secondary | ICD-10-CM

## 2021-02-05 NOTE — Progress Notes (Signed)
Subjective:    Patient ID: [name], female    DOB: 1955-10-24, 65 y.o.   MRN: [id number]   Patient is a 65 year old here today for complete physical exam.  Past medical history list abdominal aortic aneurysm.  However I reviewed a CTA of the chest abdomen and pelvis from October 2021 and there was no mention of an aneurysm.  Descending aorta is 3 cm in diameter.  Mammogram was performed in August of this year and was normal.  Colonoscopy was performed in 2017 and is normal.  Patient has a history of bilateral carotid artery stenosis.  Today on exam she has a pronounced right carotid bruit.  She is due to repeat a carotid ultrasound.  She is also due for a bone density test. Past Medical History:  Diagnosis Date   Abdominal aortic aneurysm (AAA) 3.0 cm to 5.0 cm in diameter in female [facility])    None seen on prior imaging?    Anxiety    Cataract    Chronic left shoulder pain    Headache    Hiatal hernia    small   Hyperlipidemia    Hypertension    Pre-diabetes    Schatzki's ring    Seasonal allergies    Tremor    Past Surgical History:  Procedure Laterality Date   CARDIAC CATHETERIZATION  2005   "Ms. Nieland has essentially normal coronary arteries and normal left ventricular function.  She will be treated empirically with anti-reflux measures"   CATARACT EXTRACTION, BILATERAL     CHOLECYSTECTOMY     COLONOSCOPY    06/16/2004   AJO:INOMVE rectum/Diminutive polyps of the splenic flexure and the cecum/The remainder of the colonic mucosa appeared normal pathology (hyperplastic polyps).   COLONOSCOPY N/A 08/07/2015   Dr. Gala Romney: 6 mm descending colon tubular adenoma, multiple medium-mouthed diverticula in sigmoid colon. surveillance 2022   ESOPHAGOGASTRODUODENOSCOPY  09/11/2001   [facility]:[id number] ring otherwise normal esophagus/Normal stomach/Normal D1 and D2 status post passage of a 56 French Maloney dilator   ESOPHAGOGASTRODUODENOSCOPY N/A 11/11/2017   Procedure:  ESOPHAGOGASTRODUODENOSCOPY (EGD);  Surgeon: Daneil Dolin, MD; mild Schatzki ring s/p dilation and mild erosive gastropathy.   ESOPHAGOGASTRODUODENOSCOPY (EGD) WITH ESOPHAGEAL DILATION N/A 01/25/2013   Dr. Gala Romney- schatzki's ring- 70 F dilation. small hiatal hernia   HYSTEROSCOPY WITH D & C N/A 03/06/2019   Procedure: DILATATION AND CURETTAGE /HYSTEROSCOPY;  Surgeon: Jonnie Kind, MD;  Location: [facility];  Service: Gynecology;  Laterality: N/A;   MALONEY DILATION N/A 11/11/2017   Procedure: [name] DILATION;  Surgeon: Daneil Dolin, MD;  Location: [facility];  Service: Endoscopy;  Laterality: N/A;   POLYPECTOMY N/A 03/06/2019   Procedure: POLYPECTOMY ( REMOVAL OF ENDOMETRIAL POLYP);  Surgeon: Jonnie Kind, MD;  Location: [facility];  Service: Gynecology;  Laterality: N/A;   TUBAL LIGATION     Current Outpatient Medications on File Prior to Visit  Medication Sig Dispense Refill   ALPRAZolam (XANAX) 0.5 MG tablet TAKE 1 TABLET(0.5 MG) BY MOUTH TWICE DAILY AS NEEDED FOR ANXIETY 60 tablet 0   acetaminophen (TYLENOL) 500 MG tablet Take 500 mg by mouth every 6 (six) hours as needed for moderate pain or headache.     amLODipine (NORVASC) 10 MG tablet TAKE 1 TABLET(10 MG) BY MOUTH AT BEDTIME 90 tablet 3   aspirin EC 81 MG tablet Take 81 mg by mouth at bedtime.      cholecalciferol (VITAMIN D3) 25 MCG (1000 UT) tablet Take 2,000 Units by mouth  daily.     Cyanocobalamin (B-12) 2500 MCG TABS Take 2,500 mcg by mouth daily.     EPINEPHrine 0.3 mg/0.3 mL IJ SOAJ injection Inject 0.3 mg into the muscle as needed for anaphylaxis.      fluticasone (FLONASE) 50 MCG/ACT nasal spray SHAKE LIQUID AND USE 2 SPRAYS IN EACH NOSTRIL DAILY (Patient taking differently: Place 2 sprays into both nostrils daily.) 16 g 6   gabapentin (NEURONTIN) 100 MG capsule TAKE 1 CAPSULE(100 MG) BY MOUTH THREE TIMES DAILY 90 capsule 0   hydrochlorothiazide (HYDRODIURIL) 25 MG tablet TAKE 1 TABLET(25 MG) BY MOUTH DAILY 30 tablet 11    hydrOXYzine (ATARAX/VISTARIL) 25 MG tablet Take 0.5-1 tablets (12.5-25 mg total) by mouth every 8 (eight) hours as needed for itching. 20 tablet 0   ipratropium (ATROVENT) 0.03 % nasal spray INSTILL 2 SPRAYS IN EACH NOSTRIL EVERY 12 HOURS 30 mL 12   levocetirizine (XYZAL) 5 MG tablet Take 5 mg by mouth every evening.     LINZESS 145 MCG CAPS capsule TAKE 1 CAPSULE(145 MCG) BY MOUTH DAILY BEFORE BREAKFAST (Patient taking differently: Take 145 mcg by mouth daily before breakfast.) 30 capsule 3   loratadine (CLARITIN) 10 MG tablet Take 10 mg by mouth daily as needed for allergies.     MAGNESIUM PO Take 330 mg by mouth daily.     metoprolol succinate (TOPROL-XL) 100 MG 24 hr tablet Take 1 tablet (100 mg total) by mouth daily. 90 tablet 3   mometasone (ELOCON) 0.1 % cream Apply 1 application topically daily as needed (for irritation).      montelukast (SINGULAIR) 10 MG tablet TAKE 1 TABLET(10 MG) BY MOUTH AT BEDTIME (Patient taking differently: Take 10 mg by mouth at bedtime.) 30 tablet 3   omeprazole (PRILOSEC) 40 MG capsule Take 1 capsule (40 mg total) by mouth 2 (two) times daily before a meal. 60 capsule 3   ondansetron (ZOFRAN) 4 MG tablet Take 1 tablet (4 mg total) by mouth every 8 (eight) hours as needed for nausea or vomiting. 30 tablet 1   potassium chloride SA (KLOR-CON) 20 MEQ tablet TAKE 1 TABLET(20 MEQ) BY MOUTH DAILY 30 tablet 5   rosuvastatin (CRESTOR) 5 MG tablet TAKE 1 TABLET(5 MG) BY MOUTH DAILY 90 tablet 3   sodium chloride (OCEAN) 0.65 % SOLN nasal spray Place 1 spray into both nostrils as needed for congestion.     sucralfate (CARAFATE) 1 g tablet TAKE 1 TABLET(1 GRAM) BY MOUTH FOUR TIMES DAILY WITH MEALS AND AT BEDTIME 120 tablet 0   tacrolimus (PROTOPIC) 0.1 % ointment Apply 1 application topically daily as needed (for irritation).      triamcinolone cream (KENALOG) 0.1 % APPLY EXTERNALLY TO THE AFFECTED AREA TWICE DAILY (Patient taking differently: Apply 1 application topically  2 (two) times daily as needed (Rash).) 45 g 0   valsartan (DIOVAN) 320 MG tablet TAKE 1 TABLET BY MOUTH EVERY DAY 90 tablet 1   vitamin C (ASCORBIC ACID) 250 MG tablet Take 500 mg by mouth daily.     vitamin E 400 UNIT capsule Take 400 Units by mouth daily.     zinc gluconate 50 MG tablet Take 50 mg by mouth daily.     No current facility-administered medications on file prior to visit.   Allergies  Allergen Reactions   Ciprofloxacin Shortness Of Breath   Latex Other (See Comments)    Burns skin   Statins     Muscle aches   Social History  Socioeconomic History   Marital status: Single    Spouse name: Not on file   Number of children: 3   Years of education: Not on file   Highest education level: Not on file  Occupational History   Occupation: English as a second language teacher: [employer]  Tobacco Use   Smoking status: Former    Packs/day: 0.25    Years: 35.00    Pack years: 8.75    Types: Cigarettes    Quit date: 12/31/2012    Years since quitting: 8.1   Smokeless tobacco: Never  Vaping Use   Vaping Use: Never used  Substance and Sexual Activity   Alcohol use: Not Currently   Drug use: No   Sexual activity: Not Currently    Partners: Male    Birth control/protection: Post-menopausal, Surgical    Comment: tubal  Other Topics Concern   Not on file  Social History Narrative   Eats all food groups.    Wears seatbelt.    Has 3 children.   Prior smoker.   Attends church.    Used to work in [employer].    Drives.   Enjoys going out with family.    Social Determinants of Health   Financial Resource Strain: Not on file  Food Insecurity: Not on file  Transportation Needs: Not on file  Physical Activity: Not on file  Stress: Not on file  Social Connections: Not on file  Intimate Partner Violence: Not on file   Family History  Problem Relation Age of Onset   Diabetes Other    Diabetes Mother    Heart disease Mother    Heart disease Father    Diabetes Sister     Diabetes Brother    Hypertension Brother    Diabetes Daughter    Hypertension Maternal Grandmother    Stroke Maternal Grandfather    Early death Paternal Grandfather    Colon cancer Neg Hx    Liver disease Neg Hx       Review of Systems  All other systems reviewed and are negative.     Objective:   Physical Exam Vitals reviewed.  Constitutional:      General: She is not in acute distress.    Appearance: Normal appearance. She is well-developed and normal weight. She is not ill-appearing, toxic-appearing or diaphoretic.  HENT:     Head: Normocephalic and atraumatic.     Right Ear: Tympanic membrane, ear canal and external ear normal.     Left Ear: Tympanic membrane, ear canal and external ear normal.     Nose: Nose normal. No congestion or rhinorrhea.     Mouth/Throat:     Mouth: Mucous membranes are moist.     Pharynx: Oropharynx is clear. No oropharyngeal exudate or posterior oropharyngeal erythema.  Eyes:     General: No visual field deficit or scleral icterus.       Right eye: No discharge.        Left eye: No discharge.     Extraocular Movements: Extraocular movements intact.     Right eye: Normal extraocular motion and no nystagmus.     Left eye: Normal extraocular motion and no nystagmus.     Conjunctiva/sclera: Conjunctivae normal.     Pupils: Pupils are equal, round, and reactive to light.     Right eye: Pupil is round and reactive.     Left eye: Pupil is round and reactive.  Neck:     Vascular: No carotid bruit.  Cardiovascular:  Rate and Rhythm: Normal rate and regular rhythm.     Pulses:          Carotid pulses are  on the right side with bruit.    Heart sounds: Normal heart sounds. No murmur heard.   No friction rub. No gallop.  Pulmonary:     Effort: Pulmonary effort is normal. No respiratory distress.     Breath sounds: Normal breath sounds. No stridor. No wheezing or rales.  Chest:     Chest wall: No tenderness.  Abdominal:     General:  Abdomen is flat. Bowel sounds are normal. There is no distension.     Palpations: Abdomen is soft. There is no mass.     Tenderness: There is no abdominal tenderness. There is no guarding or rebound.     Hernia: No hernia is present.  Musculoskeletal:        General: No swelling, tenderness, deformity or signs of injury. Normal range of motion.     Right hand: No swelling, deformity, tenderness or bony tenderness.     Left hand: Normal. No swelling, deformity, tenderness or bony tenderness. Normal range of motion.     Cervical back: Normal range of motion and neck supple. No rigidity or tenderness.     Right lower leg: No edema.     Left lower leg: No edema.  Lymphadenopathy:     Cervical: No cervical adenopathy.  Skin:    General: Skin is warm and dry.     Coloration: Skin is not jaundiced or pale.     Findings: No bruising, erythema, lesion or rash.  Neurological:     Mental Status: She is alert and oriented to person, place, and time. Mental status is at baseline.     Cranial Nerves: No cranial nerve deficit, dysarthria or facial asymmetry.     Sensory: No sensory deficit.     Motor: Tremor present. No weakness or abnormal muscle tone.     Coordination: Romberg sign negative. Coordination normal.     Gait: Gait normal.  Psychiatric:        Mood and Affect: Mood normal.        Behavior: Behavior normal.        Thought Content: Thought content normal.        Judgment: Judgment normal.          Assessment & Plan:  General medical exam  Essential hypertension  Essential tremor  Statin myopathy  Hyperlipidemia, unspecified hyperlipidemia type  Prediabetes  Postmenopausal estrogen deficiency - Plan: DG Bone Density  Bilateral carotid artery stenosis - Plan: Kimberly Carotid Duplex Bilateral  Encounter for immunization - Plan: Flu Vaccine QUAD High Dose(Fluad)  Recommended a bone density test.  Recommended carotid ultrasound to evaluate for carotid artery stenosis.   Patient received her flu shot.  Recommended the shingles vaccine as well as COVID booster.  Reviewed her lab work including CBC, CMP, lipid panel, and A1c.  Labs are outstanding except for mild elevation in A1c and prediabetes.  Blood pressure is elevated however the patient is checking her blood pressure frequently at home and her blood pressure at home has been well controlled.  She will use clonidine as needed

## 2021-02-06 ENCOUNTER — Other Ambulatory Visit: Payer: Self-pay

## 2021-02-06 ENCOUNTER — Telehealth: Payer: Self-pay

## 2021-02-06 DIAGNOSIS — I739 Peripheral vascular disease, unspecified: Secondary | ICD-10-CM

## 2021-02-06 NOTE — Telephone Encounter (Signed)
Pt called after seeing her PCP yesterday who noted a R carotid bruit. She has been set up by his office for a carotid u/s next week. She is due for 1 year f/u in our office and has been scheduled for this with ABI's, per recall. Pt is aware of these appts and has no further questions at this time.

## 2021-02-10 ENCOUNTER — Ambulatory Visit
Admission: RE | Admit: 2021-02-10 | Discharge: 2021-02-10 | Disposition: A | Discharge status: Home / Self Care | Payer: Medicare Other | Source: Ambulatory Visit | Attending: Family Medicine | Admitting: Family Medicine

## 2021-02-10 DIAGNOSIS — I6523 Occlusion and stenosis of bilateral carotid arteries: Secondary | ICD-10-CM | POA: Diagnosis not present

## 2021-02-12 ENCOUNTER — Other Ambulatory Visit: Payer: Self-pay | Admitting: Family Medicine

## 2021-02-12 DIAGNOSIS — I6521 Occlusion and stenosis of right carotid artery: Secondary | ICD-10-CM

## 2021-02-18 ENCOUNTER — Ambulatory Visit (INDEPENDENT_AMBULATORY_CARE_PROVIDER_SITE_OTHER): Payer: Medicare Other | Admitting: Vascular Surgery

## 2021-02-18 ENCOUNTER — Encounter: Payer: Self-pay | Admitting: Vascular Surgery

## 2021-02-18 ENCOUNTER — Other Ambulatory Visit: Payer: Self-pay

## 2021-02-18 ENCOUNTER — Ambulatory Visit (HOSPITAL_COMMUNITY)
Admission: RE | Admit: 2021-02-18 | Discharge: 2021-02-18 | Disposition: A | Discharge status: Home / Self Care | Payer: Medicare Other | Source: Ambulatory Visit | Attending: Vascular Surgery | Admitting: Vascular Surgery

## 2021-02-18 VITALS — BP 159/74 | HR 57 | Temp 97.9°F | Resp 20 | Ht 67.0 in | Wt 167.0 lb

## 2021-02-18 DIAGNOSIS — M7989 Other specified soft tissue disorders: Secondary | ICD-10-CM

## 2021-02-18 DIAGNOSIS — I6523 Occlusion and stenosis of bilateral carotid arteries: Secondary | ICD-10-CM

## 2021-02-18 DIAGNOSIS — I739 Peripheral vascular disease, unspecified: Secondary | ICD-10-CM | POA: Diagnosis not present

## 2021-02-18 NOTE — Progress Notes (Signed)
Patient ID: [name], female   DOB: 05-Nov-1955, 65 y.o.   MRN: [id number]  Reason for Consult: Follow-up   Referred by Susy Frizzle, MD  Subjective:     HPI:  [name] is a 65 y.o. female history of carotid artery stenosis and decreased ABIs bilaterally.  She was previously thought to have a small abdominal aortic aneurysm recently had a CT scan which did not demonstrate this.  She has no new back or abdominal pain.  She walks is much as she wants without any limitation.  She has no tissue loss or ulceration.  She denies any previous history of stroke, TIA or amaurosis.  Past Medical History:  Diagnosis Date   Abdominal aortic aneurysm (AAA) 3.0 cm to 5.0 cm in diameter in female    None seen on prior imaging?    Anxiety    Cataract    Chronic left shoulder pain    Headache    Hiatal hernia    small   Hyperlipidemia    Hypertension    Pre-diabetes    Schatzki's ring    Seasonal allergies    Tremor    Family History  Problem Relation Age of Onset   Diabetes Other    Diabetes Mother    Heart disease Mother    Heart disease Father    Diabetes Sister    Diabetes Brother    Hypertension Brother    Diabetes Daughter    Hypertension Maternal Grandmother    Stroke Maternal Grandfather    Early death Paternal Grandfather    Colon cancer Neg Hx    Liver disease Neg Hx    Past Surgical History:  Procedure Laterality Date   CARDIAC CATHETERIZATION  2005   "Ms. Estey has essentially normal coronary arteries and normal left ventricular function.  She will be treated empirically with anti-reflux measures"   CATARACT EXTRACTION, BILATERAL     CHOLECYSTECTOMY     COLONOSCOPY    06/16/2004   PQZ:RAQTMA rectum/Diminutive polyps of the splenic flexure and the cecum/The remainder of the colonic mucosa appeared normal pathology (hyperplastic polyps).   COLONOSCOPY N/A 08/07/2015   Dr. Gala Romney: 6 mm descending colon tubular adenoma, multiple medium-mouthed  diverticula in sigmoid colon. surveillance 2022   ESOPHAGOGASTRODUODENOSCOPY  09/11/2001   UQJ:[id number] ring otherwise normal esophagus/Normal stomach/Normal D1 and D2 status post passage of a 56 French Maloney dilator   ESOPHAGOGASTRODUODENOSCOPY N/A 11/11/2017   Procedure: ESOPHAGOGASTRODUODENOSCOPY (EGD);  Surgeon: Daneil Dolin, MD; mild Schatzki ring s/p dilation and mild erosive gastropathy.   ESOPHAGOGASTRODUODENOSCOPY (EGD) WITH ESOPHAGEAL DILATION N/A 01/25/2013   Dr. Gala Romney- schatzki's ring- 43 F dilation. small hiatal hernia   HYSTEROSCOPY WITH D & C N/A 03/06/2019   Procedure: DILATATION AND CURETTAGE /HYSTEROSCOPY;  Surgeon: Jonnie Kind, MD;  Location: [facility];  Service: Gynecology;  Laterality: N/A;   MALONEY DILATION N/A 11/11/2017   Procedure: [name] DILATION;  Surgeon: Daneil Dolin, MD;  Location: [facility];  Service: Endoscopy;  Laterality: N/A;   POLYPECTOMY N/A 03/06/2019   Procedure: POLYPECTOMY ( REMOVAL OF ENDOMETRIAL POLYP);  Surgeon: Jonnie Kind, MD;  Location: [facility];  Service: Gynecology;  Laterality: N/A;   TUBAL LIGATION      Short Social History:  Social History   Tobacco Use   Smoking status: Former    Packs/day: 0.25    Years: 35.00    Pack years: 8.75    Types: Cigarettes    Quit date: 12/31/2012  Years since quitting: 8.1   Smokeless tobacco: Never  Substance Use Topics   Alcohol use: Not Currently    Allergies  Allergen Reactions   Ciprofloxacin Shortness Of Breath   Latex Other (See Comments)    Burns skin    Current Outpatient Medications  Medication Sig Dispense Refill   acetaminophen (TYLENOL) 500 MG tablet Take 500 mg by mouth every 6 (six) hours as needed for moderate pain or headache.     ALPRAZolam (XANAX) 0.5 MG tablet TAKE 1 TABLET(0.5 MG) BY MOUTH TWICE DAILY AS NEEDED FOR ANXIETY 60 tablet 0   amLODipine (NORVASC) 10 MG tablet TAKE 1 TABLET(10 MG) BY MOUTH AT BEDTIME 90 tablet 3   aspirin EC 81 MG tablet Take 81  mg by mouth at bedtime.      cholecalciferol (VITAMIN D3) 25 MCG (1000 UT) tablet Take 2,000 Units by mouth daily.     Cyanocobalamin (B-12) 2500 MCG TABS Take 2,500 mcg by mouth daily.     EPINEPHrine 0.3 mg/0.3 mL IJ SOAJ injection Inject 0.3 mg into the muscle as needed for anaphylaxis.      fluticasone (FLONASE) 50 MCG/ACT nasal spray SHAKE LIQUID AND USE 2 SPRAYS IN EACH NOSTRIL DAILY (Patient taking differently: Place 2 sprays into both nostrils daily.) 16 g 6   gabapentin (NEURONTIN) 100 MG capsule TAKE 1 CAPSULE(100 MG) BY MOUTH THREE TIMES DAILY 90 capsule 0   hydrochlorothiazide (HYDRODIURIL) 25 MG tablet TAKE 1 TABLET(25 MG) BY MOUTH DAILY 30 tablet 11   ipratropium (ATROVENT) 0.03 % nasal spray INSTILL 2 SPRAYS IN EACH NOSTRIL EVERY 12 HOURS 30 mL 12   levocetirizine (XYZAL) 5 MG tablet Take 5 mg by mouth every evening.     LINZESS 145 MCG CAPS capsule TAKE 1 CAPSULE(145 MCG) BY MOUTH DAILY BEFORE BREAKFAST (Patient taking differently: Take 145 mcg by mouth daily before breakfast.) 30 capsule 3   loratadine (CLARITIN) 10 MG tablet Take 10 mg by mouth daily as needed for allergies.     MAGNESIUM PO Take 330 mg by mouth daily.     metoprolol succinate (TOPROL-XL) 100 MG 24 hr tablet Take 1 tablet (100 mg total) by mouth daily. 90 tablet 3   mometasone (ELOCON) 0.1 % cream Apply 1 application topically daily as needed (for irritation).      montelukast (SINGULAIR) 10 MG tablet TAKE 1 TABLET(10 MG) BY MOUTH AT BEDTIME (Patient taking differently: Take 10 mg by mouth at bedtime.) 30 tablet 3   omeprazole (PRILOSEC) 40 MG capsule Take 1 capsule (40 mg total) by mouth 2 (two) times daily before a meal. 60 capsule 3   ondansetron (ZOFRAN) 4 MG tablet Take 1 tablet (4 mg total) by mouth every 8 (eight) hours as needed for nausea or vomiting. 30 tablet 1   potassium chloride SA (KLOR-CON) 20 MEQ tablet TAKE 1 TABLET(20 MEQ) BY MOUTH DAILY 30 tablet 5   rosuvastatin (CRESTOR) 5 MG tablet TAKE 1  TABLET(5 MG) BY MOUTH DAILY 90 tablet 3   sodium chloride (OCEAN) 0.65 % SOLN nasal spray Place 1 spray into both nostrils as needed for congestion.     tacrolimus (PROTOPIC) 0.1 % ointment Apply 1 application topically daily as needed (for irritation).      triamcinolone cream (KENALOG) 0.1 % APPLY EXTERNALLY TO THE AFFECTED AREA TWICE DAILY (Patient taking differently: Apply 1 application topically 2 (two) times daily as needed (Rash).) 45 g 0   valsartan (DIOVAN) 320 MG tablet TAKE 1 TABLET BY MOUTH  EVERY DAY 90 tablet 1   vitamin C (ASCORBIC ACID) 250 MG tablet Take 500 mg by mouth daily.     vitamin E 400 UNIT capsule Take 400 Units by mouth daily.     zinc gluconate 50 MG tablet Take 50 mg by mouth daily.     No current facility-administered medications for this visit.    Review of Systems  Constitutional:  Constitutional negative. HENT: HENT negative.  Eyes: Eyes negative.  Respiratory: Respiratory negative.  Cardiovascular: Cardiovascular negative.  GI: Gastrointestinal negative.  Musculoskeletal: Musculoskeletal negative.  Skin: Skin negative.  Neurological: Neurological negative. Hematologic: Hematologic/lymphatic negative.  Psychiatric: Psychiatric negative.       Objective:  Objective   Vitals:   02/18/21 1037 02/18/21 1038  BP: (!) 153/76 (!) 159/74  Pulse: (!) 57   Resp: 20   Temp: 97.9 F (36.6 C)   SpO2: 97%   Weight: 167 lb (75.8 kg)   Height: 5\' 7"  (1.702 m)    Body mass index is 26.16 kg/m.  Physical Exam HENT:     Head: Normocephalic.     Nose:     Comments: Wearing a mask Eyes:     Pupils: Pupils are equal, round, and reactive to light.  Neck:     Vascular: Carotid bruit present.  Cardiovascular:     Rate and Rhythm: Normal rate.     Pulses:          Radial pulses are 2+ on the right side and 2+ on the left side.       Dorsalis pedis pulses are 1+ on the right side and 1+ on the left side.  Pulmonary:     Effort: Pulmonary effort is  normal.     Breath sounds: Normal breath sounds.  Abdominal:     General: Abdomen is flat.     Palpations: Abdomen is soft.  Musculoskeletal:        General: Normal range of motion.     Right lower leg: No edema.     Left lower leg: No edema.  Skin:    General: Skin is warm and dry.     Capillary Refill: Capillary refill takes less than 2 seconds.  Neurological:     General: No focal deficit present.     Mental Status: She is alert.  Psychiatric:        Mood and Affect: Mood normal.        Behavior: Behavior normal.        Thought Content: Thought content normal.    Data: ABI Findings:  +---------+------------------+-----+----------+--------+  Right    Rt Pressure (mmHg)IndexWaveform  Comment   +---------+------------------+-----+----------+--------+  Brachial 173                                        +---------+------------------+-----+----------+--------+  PTA      109               0.63 monophasic          +---------+------------------+-----+----------+--------+  DP       108               0.62 biphasic            +---------+------------------+-----+----------+--------+  Great Toe44                0.25                     +---------+------------------+-----+----------+--------+   +---------+------------------+-----+--------+-------+  Left     Lt Pressure (mmHg)IndexWaveformComment  +---------+------------------+-----+--------+-------+  Brachial 168                                     +---------+------------------+-----+--------+-------+  PTA      131               0.76 biphasic         +---------+------------------+-----+--------+-------+  DP       127               0.73 biphasic         +---------+------------------+-----+--------+-------+  Great Toe70                0.40                  +---------+------------------+-----+--------+-------+   +-------+-----------+-----------+------------+------------+   ABI/TBIToday's ABIToday's TBIPrevious ABIPrevious TBI  +-------+-----------+-----------+------------+------------+  Right  0.63       0.25       0.83        0.70          +-------+-----------+-----------+------------+------------+  Left   0.76       0.40       0.89        0.68          +-------+-----------+-----------+------------+------------+   Carotid duplex IMPRESSION: 1. Large calcified plaque burden in the right carotid bulb results in severe greater than 70% stenosis. This has progressed since 2018. 2. Moderate atherosclerotic plaque burden in the left carotid bulb results in 50-69% stenosis. 3. Antegrade flow in the bilateral vertebral arteries.      Assessment/Plan:    65 year old female previous carotid disease, varicose veins, peripheral arterial disease.  None of this is symptomatic at this time.  We reviewed her carotid duplex and ABIs today.  She actually has palpable dorsalis pedis pulses bilaterally.  She will follow-up in 1 year with repeat carotid duplex and ABIs. Continue asa and statin.       [name] MD [facility]

## 2021-02-19 ENCOUNTER — Other Ambulatory Visit: Payer: Self-pay | Admitting: Gastroenterology

## 2021-02-19 DIAGNOSIS — R110 Nausea: Secondary | ICD-10-CM

## 2021-02-20 ENCOUNTER — Telehealth: Payer: Self-pay | Admitting: Pharmacist

## 2021-02-20 NOTE — Progress Notes (Signed)
Chronic Care Management Pharmacy Assistant   Name: [name]  MRN: [id number] DOB: [birth year]  Reason for Encounter: Disease State For DM.   Conditions to be addressed/monitored: Hypertension, GERD, Type II DM, Hyperlipidemia.  Recent office visits:  02/05/21 Dr. Dennard Schaumann For medicare wellness exam. No medication changes.   Recent consult visits:  02/18/21 [facility] [name], MD. STOPPED Sucralfate.   Hospital visits:  None since 12/18/20  Medications: Outpatient Encounter Medications as of 02/20/2021  Medication Sig   acetaminophen (TYLENOL) 500 MG tablet Take 500 mg by mouth every 6 (six) hours as needed for moderate pain or headache.   ALPRAZolam (XANAX) 0.5 MG tablet TAKE 1 TABLET(0.5 MG) BY MOUTH TWICE DAILY AS NEEDED FOR ANXIETY   amLODipine (NORVASC) 10 MG tablet TAKE 1 TABLET(10 MG) BY MOUTH AT BEDTIME   aspirin EC 81 MG tablet Take 81 mg by mouth at bedtime.    cholecalciferol (VITAMIN D3) 25 MCG (1000 UT) tablet Take 2,000 Units by mouth daily.   Cyanocobalamin (B-12) 2500 MCG TABS Take 2,500 mcg by mouth daily.   EPINEPHrine 0.3 mg/0.3 mL IJ SOAJ injection Inject 0.3 mg into the muscle as needed for anaphylaxis.    fluticasone (FLONASE) 50 MCG/ACT nasal spray SHAKE LIQUID AND USE 2 SPRAYS IN EACH NOSTRIL DAILY (Patient taking differently: Place 2 sprays into both nostrils daily.)   gabapentin (NEURONTIN) 100 MG capsule TAKE 1 CAPSULE(100 MG) BY MOUTH THREE TIMES DAILY   hydrochlorothiazide (HYDRODIURIL) 25 MG tablet TAKE 1 TABLET(25 MG) BY MOUTH DAILY   ipratropium (ATROVENT) 0.03 % nasal spray INSTILL 2 SPRAYS IN EACH NOSTRIL EVERY 12 HOURS   levocetirizine (XYZAL) 5 MG tablet Take 5 mg by mouth every evening.   LINZESS 145 MCG CAPS capsule TAKE 1 CAPSULE(145 MCG) BY MOUTH DAILY BEFORE BREAKFAST (Patient taking differently: Take 145 mcg by mouth daily before breakfast.)   loratadine (CLARITIN) 10 MG tablet Take 10 mg by  mouth daily as needed for allergies.   MAGNESIUM PO Take 330 mg by mouth daily.   metoprolol succinate (TOPROL-XL) 100 MG 24 hr tablet Take 1 tablet (100 mg total) by mouth daily.   mometasone (ELOCON) 0.1 % cream Apply 1 application topically daily as needed (for irritation).    montelukast (SINGULAIR) 10 MG tablet TAKE 1 TABLET(10 MG) BY MOUTH AT BEDTIME (Patient taking differently: Take 10 mg by mouth at bedtime.)   omeprazole (PRILOSEC) 40 MG capsule Take 1 capsule (40 mg total) by mouth 2 (two) times daily before a meal.   ondansetron (ZOFRAN) 4 MG tablet Take 1 tablet (4 mg total) by mouth every 8 (eight) hours as needed for nausea or vomiting.   potassium chloride SA (KLOR-CON) 20 MEQ tablet TAKE 1 TABLET(20 MEQ) BY MOUTH DAILY   rosuvastatin (CRESTOR) 5 MG tablet TAKE 1 TABLET(5 MG) BY MOUTH DAILY   sodium chloride (OCEAN) 0.65 % SOLN nasal spray Place 1 spray into both nostrils as needed for congestion.   tacrolimus (PROTOPIC) 0.1 % ointment Apply 1 application topically daily as needed (for irritation).    triamcinolone cream (KENALOG) 0.1 % APPLY EXTERNALLY TO THE AFFECTED AREA TWICE DAILY (Patient taking differently: Apply 1 application topically 2 (two) times daily as needed (Rash).)   valsartan (DIOVAN) 320 MG tablet TAKE 1 TABLET BY MOUTH EVERY DAY   vitamin C (ASCORBIC ACID) 250 MG tablet Take 500 mg by mouth daily.   vitamin E 400 UNIT capsule Take 400 Units by mouth daily.  zinc gluconate 50 MG tablet Take 50 mg by mouth daily.   No facility-administered encounter medications on file as of 02/20/2021.   Recent Relevant Labs: Lab Results  Component Value Date/Time   HGBA1C 5.9 (H) 01/20/2021 10:08 AM   HGBA1C 5.8 (H) 09/13/2019 02:39 PM   MICROALBUR 0.5 01/20/2021 10:08 AM   MICROALBUR 0.2 09/13/2019 02:39 PM    Kidney Function Lab Results  Component Value Date/Time   CREATININE 0.79 01/20/2021 10:08 AM   CREATININE 0.79 08/18/2020 05:37 PM   CREATININE 0.79  07/31/2020 08:57 PM   CREATININE 0.93 06/10/2020 03:41 PM   GFRNONAA >60 08/18/2020 05:37 PM   UGQBVQXI 50 06/10/2020 03:41 PM   GFRAA 74 06/10/2020 03:41 PM    Current antihyperglycemic regimen:  None.   What recent interventions/DTPs have been made to improve glycemic control:  None.   Have there been any recent hospitalizations or ED visits since last visit with CPP? Patient stated no.   Patient reports hypoglycemic symptoms, including None  Patient denies hyperglycemic symptoms, including none  How often are you checking your blood sugar? once daily  What are your blood sugars ranging?  Patent stated her readings are within normal limits most of the time but sometimes her blood sugar readings do drop because she eats later in the day.  During the week, how often does your blood glucose drop below 70? Patient stated sometimes her readings drop around 70 because of her appetite.   Are you checking your feet daily/regularly?  Patient reminded to check her feet regularly.   Adherence Review: Is the patient currently on a STATIN medication? Rosuvastatin 5 mg   Is the patient currently on ACE/ARB medication? Valsartan 320 mg   Does the patient have >5 day gap between last estimated fill dates? Per misc rpts, no.  Care Gaps:Patient is due for her AWV, foot, DEXA, eye exam. A1C: 5.8 BP:142/63  Star Rating Drugs:Valsartan 320 mg 02/02/21 90 DS, Rosuvastatin 5 mg 12/08/20 90 DS.  Follow-Up:Pharmacist Review  Charlann Lange, [place] Pharmacist Assistant [phone]

## 2021-02-23 ENCOUNTER — Telehealth: Payer: Self-pay | Admitting: Family Medicine

## 2021-02-23 NOTE — Telephone Encounter (Signed)
Patient left voicemail message to request call back from nurse about A1C and bp; following up. Please advise at [phone].

## 2021-02-24 NOTE — Telephone Encounter (Signed)
Call placed to patient. LMTRC.  

## 2021-02-25 NOTE — Telephone Encounter (Signed)
Patient was contacted by CCM.   States that BP has been fluctuating at home.   Reports that the last few days of BP readings she has taken are as follows: 137/69 136/56 145/80 146/72 181/84 (took PRN Clonidine 0.1mg ) 76/58 (after Clonidine 0.1mg ) 132/56 133/73  Reports that she is currently taking Amlodipine 10mg  PO QD, HCTZ 25mg  PO QD, Metoprolol 100mg  PO QD, and Diovan 320mg  PO QD.

## 2021-02-27 NOTE — Telephone Encounter (Signed)
Call placed to patient and patient made aware.  

## 2021-03-05 ENCOUNTER — Other Ambulatory Visit: Payer: Self-pay

## 2021-03-05 ENCOUNTER — Ambulatory Visit
Admission: RE | Admit: 2021-03-05 | Discharge: 2021-03-05 | Disposition: A | Discharge status: Home / Self Care | Payer: Medicare Other | Source: Ambulatory Visit | Attending: Family Medicine | Admitting: Family Medicine

## 2021-03-05 DIAGNOSIS — M8588 Other specified disorders of bone density and structure, other site: Secondary | ICD-10-CM | POA: Diagnosis not present

## 2021-03-05 DIAGNOSIS — Z780 Asymptomatic menopausal state: Secondary | ICD-10-CM

## 2021-03-05 DIAGNOSIS — M810 Age-related osteoporosis without current pathological fracture: Secondary | ICD-10-CM | POA: Diagnosis not present

## 2021-03-06 MED ORDER — ONDANSETRON HCL 4 MG PO TABS: ORAL_TABLET | ORAL | 0 refills | Status: DC

## 2021-03-06 NOTE — Addendum Note (Signed)
Addended by: Mahala Menghini on: 03/06/2021 07:37 AM   Modules accepted: Orders

## 2021-03-11 ENCOUNTER — Encounter: Payer: Self-pay | Admitting: Gastroenterology

## 2021-03-11 ENCOUNTER — Ambulatory Visit (INDEPENDENT_AMBULATORY_CARE_PROVIDER_SITE_OTHER): Payer: Medicare Other | Admitting: Gastroenterology

## 2021-03-11 ENCOUNTER — Other Ambulatory Visit: Payer: Self-pay

## 2021-03-11 VITALS — BP 157/78 | HR 66 | Temp 96.6°F | Ht 67.0 in | Wt 173.2 lb

## 2021-03-11 DIAGNOSIS — R1011 Right upper quadrant pain: Secondary | ICD-10-CM

## 2021-03-11 DIAGNOSIS — K5900 Constipation, unspecified: Secondary | ICD-10-CM | POA: Diagnosis not present

## 2021-03-11 DIAGNOSIS — Z860100 Personal history of colon polyps, unspecified: Secondary | ICD-10-CM

## 2021-03-11 DIAGNOSIS — K219 Gastro-esophageal reflux disease without esophagitis: Secondary | ICD-10-CM

## 2021-03-11 NOTE — Patient Instructions (Addendum)
Continue pantoprazole 40 mg every morning.  You may continue additional dose before evening meal if needed to control heartburn and abdominal pain. Continue Linzess total of 290 mcg daily for constipation. Colonoscopy to be scheduled.  See separate instructions. If you have persisting abdominal pain or persisting lump in your upper abdomen as discussed today, let me know we will we will schedule you for a CT scan.

## 2021-03-11 NOTE — Progress Notes (Signed)
Primary Care Physician: Susy Frizzle, MD  Primary Gastroenterologist:  Garfield Cornea, MD   Chief Complaint  Patient presents with   Heartburn    Burning sensation in abd too    HPI: [name] is a 65 y.o. female here for follow-up.  Patient was last seen in February.  She has a history of GERD, nausea, constipation, dysphagia, epigastric pain.  EGD in 2019 with mild Schatzki ring status post dilation and mild erosive gastropathy, no specimens collected.  H. pylori breath test - April 2020.  Delayed gastric emptying documented in 2004 but normal GES in April 2021.  Right upper quadrant ultrasound in April 2021 with no significant findings, history of cholecystectomy.  Colonoscopy March 2017 with 6 mm ascending colon tubular adenoma, multiple medical diverticula in the sigmoid colon, due for surveillance August 06, 2020 or after.  She was switched from pantoprazole to Nexium 40 mg twice daily at last office visit.  Linzess was increased from 145 mcg to 290 mcg daily.  Plan for follow-up visit to schedule colonoscopy.  Patient called again in April stating Nexium was not helping and she was switched to omeprazole 40 mg twice daily at that time.  Today: Knee surgery in July seemed to flare her stomach when started pain medication. More burning and belching. Some improvement since off pain medication.  Denies NSAIDs.Still having some heartburn/burning in the belly, but only twice per month. If misses a dose of pantoprazole may feel more night time heartburn or early morning heartburn. No dysphagia. No vomiting.  Couple of times a week she feels like there is a knot in the right upper quadrant, tender in this area.  She can massage and it seems to go away.  Size of the end of her finger.  Calcium affecting bowels. Linzess 278mcg daily, currently taking 2 of her 145 mcg doses until she completes. BM once per day. No straining. No melena, brbpr.      Current Outpatient Medications   Medication Sig Dispense Refill   acetaminophen (TYLENOL) 500 MG tablet Take 500 mg by mouth every 6 (six) hours as needed for moderate pain or headache.     ALPRAZolam (XANAX) 0.5 MG tablet TAKE 1 TABLET(0.5 MG) BY MOUTH TWICE DAILY AS NEEDED FOR ANXIETY 60 tablet 0   amLODipine (NORVASC) 10 MG tablet TAKE 1 TABLET(10 MG) BY MOUTH AT BEDTIME 90 tablet 3   aspirin EC 81 MG tablet Take 81 mg by mouth at bedtime.      cholecalciferol (VITAMIN D3) 25 MCG (1000 UT) tablet Take 2,000 Units by mouth daily.     Cyanocobalamin (B-12) 2500 MCG TABS Take 2,500 mcg by mouth daily.     EPINEPHrine 0.3 mg/0.3 mL IJ SOAJ injection Inject 0.3 mg into the muscle as needed for anaphylaxis.      gabapentin (NEURONTIN) 100 MG capsule TAKE 1 CAPSULE(100 MG) BY MOUTH THREE TIMES DAILY 90 capsule 0   hydrochlorothiazide (HYDRODIURIL) 25 MG tablet TAKE 1 TABLET(25 MG) BY MOUTH DAILY 30 tablet 11   ipratropium (ATROVENT) 0.03 % nasal spray INSTILL 2 SPRAYS IN EACH NOSTRIL EVERY 12 HOURS 30 mL 12   levocetirizine (XYZAL) 5 MG tablet Take 5 mg by mouth every evening.     LINZESS 145 MCG CAPS capsule TAKE 1 CAPSULE(145 MCG) BY MOUTH DAILY BEFORE BREAKFAST (Patient taking differently: Take 145 mcg by mouth daily before breakfast.) 30 capsule 3   loratadine (CLARITIN) 10 MG tablet Take 10 mg by  mouth daily as needed for allergies.     MAGNESIUM PO Take 330 mg by mouth daily.     metoprolol succinate (TOPROL-XL) 100 MG 24 hr tablet Take 1 tablet (100 mg total) by mouth daily. 90 tablet 3   mometasone (ELOCON) 0.1 % cream Apply 1 application topically daily as needed (for irritation).      montelukast (SINGULAIR) 10 MG tablet TAKE 1 TABLET(10 MG) BY MOUTH AT BEDTIME (Patient taking differently: Take 10 mg by mouth at bedtime.) 30 tablet 3   ondansetron (ZOFRAN) 4 MG tablet TAKE 1 TABLET(4 MG) BY MOUTH EVERY 8 HOURS AS NEEDED FOR NAUSEA OR VOMITING 30 tablet 0   potassium chloride SA (KLOR-CON) 20 MEQ tablet TAKE 1 TABLET(20  MEQ) BY MOUTH DAILY 30 tablet 5   rosuvastatin (CRESTOR) 5 MG tablet TAKE 1 TABLET(5 MG) BY MOUTH DAILY 90 tablet 3   sodium chloride (OCEAN) 0.65 % SOLN nasal spray Place 1 spray into both nostrils as needed for congestion.     tacrolimus (PROTOPIC) 0.1 % ointment Apply 1 application topically daily as needed (for irritation).      triamcinolone cream (KENALOG) 0.1 % APPLY EXTERNALLY TO THE AFFECTED AREA TWICE DAILY (Patient taking differently: Apply 1 application topically 2 (two) times daily as needed (Rash).) 45 g 0   valsartan (DIOVAN) 320 MG tablet TAKE 1 TABLET BY MOUTH EVERY DAY 90 tablet 1   vitamin C (ASCORBIC ACID) 250 MG tablet Take 500 mg by mouth daily.     vitamin E 400 UNIT capsule Take 400 Units by mouth daily.     zinc gluconate 50 MG tablet Take 50 mg by mouth daily.     No current facility-administered medications for this visit.    Allergies as of 03/11/2021 - Review Complete 03/11/2021  Allergen Reaction Noted   Ciprofloxacin Shortness Of Breath 12/27/2011   Latex Other (See Comments) 10/17/2019   Past Medical History:  Diagnosis Date   Abdominal aortic aneurysm (AAA) 3.0 cm to 5.0 cm in diameter in female    None seen on prior imaging?    Anxiety    Cataract    Chronic left shoulder pain    Headache    Hiatal hernia    small   Hyperlipidemia    Hypertension    Pre-diabetes    Schatzki's ring    Seasonal allergies    Tremor    Past Surgical History:  Procedure Laterality Date   CARDIAC CATHETERIZATION  2005   "[name] has essentially normal coronary arteries and normal left ventricular function.  She will be treated empirically with anti-reflux measures"   CATARACT EXTRACTION, BILATERAL     CHOLECYSTECTOMY     COLONOSCOPY    06/16/2004   [id number] rectum/Diminutive polyps of the splenic flexure and the cecum/The remainder of the colonic mucosa appeared normal pathology (hyperplastic polyps).   COLONOSCOPY N/A 08/07/2015   Dr. Gala Romney: 6 mm  descending colon tubular adenoma, multiple medium-mouthed diverticula in sigmoid colon. surveillance 2022   ESOPHAGOGASTRODUODENOSCOPY  09/11/2001   [id number] ring otherwise normal esophagus/Normal stomach/Normal D1 and D2 status post passage of a 56 French Maloney dilator   ESOPHAGOGASTRODUODENOSCOPY N/A 11/11/2017   Procedure: ESOPHAGOGASTRODUODENOSCOPY (EGD);  Surgeon: Daneil Dolin, MD; mild Schatzki ring s/p dilation and mild erosive gastropathy.   ESOPHAGOGASTRODUODENOSCOPY (EGD) WITH ESOPHAGEAL DILATION N/A 01/25/2013   Dr. Gala Romney- schatzki's ring- 57 F dilation. small hiatal hernia   HYSTEROSCOPY WITH D & C N/A 03/06/2019   Procedure: DILATATION AND CURETTAGE /  HYSTEROSCOPY;  Surgeon: Jonnie Kind, MD;  Location: [facility];  Service: Gynecology;  Laterality: N/A;   MALONEY DILATION N/A 11/11/2017   Procedure: [name] DILATION;  Surgeon: Daneil Dolin, MD;  Location: [facility];  Service: Endoscopy;  Laterality: N/A;   POLYPECTOMY N/A 03/06/2019   Procedure: POLYPECTOMY ( REMOVAL OF ENDOMETRIAL POLYP);  Surgeon: Jonnie Kind, MD;  Location: [facility];  Service: Gynecology;  Laterality: N/A;   TUBAL LIGATION     Family History  Problem Relation Age of Onset   Diabetes Other    Diabetes Mother    Heart disease Mother    Heart disease Father    Diabetes Sister    Diabetes Brother    Hypertension Brother    Diabetes Daughter    Hypertension Maternal Grandmother    Stroke Maternal Grandfather    Early death Paternal Grandfather    Colon cancer Neg Hx    Liver disease Neg Hx    Social History   Tobacco Use   Smoking status: Former    Packs/day: 0.25    Years: 35.00    Pack years: 8.75    Types: Cigarettes    Quit date: 12/31/2012    Years since quitting: 8.1   Smokeless tobacco: Never  Vaping Use   Vaping Use: Never used  Substance Use Topics   Alcohol use: Not Currently   Drug use: No    ROS:  General: Negative for anorexia, weight loss, fever, chills,  fatigue, weakness. ENT: Negative for hoarseness, difficulty swallowing , nasal congestion. CV: Negative for chest pain, angina, palpitations, dyspnea on exertion, peripheral edema.  Respiratory: Negative for dyspnea at rest, dyspnea on exertion, cough, sputum, wheezing.  GI: See history of present illness. GU:  Negative for dysuria, hematuria, urinary incontinence, urinary frequency, nocturnal urination.  Endo: Negative for unusual weight change.    Physical Examination:   BP (!) 157/78   Pulse 66   Temp (!) 96.6 F (35.9 C) (Temporal)   Ht 5\' 7"  (1.702 m)   Wt 173 lb 3.2 oz (78.6 kg)   BMI 27.13 kg/m   General: Well-nourished, well-developed in no acute distress.  Eyes: No icterus. Mouth:masked Lungs: Clear to auscultation bilaterally.  Heart: Regular rate and rhythm, no murmurs rubs or gallops.  Abdomen: Bowel sounds are normal, nontender, nondistended, no hepatosplenomegaly or masses, no abdominal bruits or hernia , no rebound or guarding.   Extremities: No lower extremity edema. No clubbing or deformities. Neuro: Alert and oriented x 4   Skin: Warm and dry, no jaundice.   Psych: Alert and cooperative, normal mood and affect.  Labs:  Lab Results  Component Value Date   CREATININE 0.79 01/20/2021   BUN 11 01/20/2021   NA 141 01/20/2021   K 4.4 01/20/2021   CL 104 01/20/2021   CO2 29 01/20/2021   Lab Results  Component Value Date   WBC 6.2 01/20/2021   HGB 13.0 01/20/2021   HCT 40.1 01/20/2021   MCV 91.6 01/20/2021   PLT 252 01/20/2021   Lab Results  Component Value Date   ALT 12 01/20/2021   AST 17 01/20/2021   ALKPHOS 87 03/02/2019   BILITOT 0.7 01/20/2021     Lab Results  Component Value Date   VITAMINB12 >2,000 (H) 01/20/2021   Lab Results  Component Value Date   HGBA1C 5.9 (H) 01/20/2021     Assessment:  Constipation: Doing better on Linzess 290 mcg daily.  GERD: Overall doing much better.  Nexium and omeprazole were not as effective as  pantoprazole.  She is back on twice daily dosing.  Breakthrough heartburn twice per month.  No significant nausea. Goal of using least effective dose.   Occasional right upper quadrant discomfort: Intermittent fingertip sized lump tender to palpation, massages away.  Does not occur all the time.  None noted today.  Continue to monitor.  If persistent abdominal pain or persisting "lump" could consider further imaging via CT.  History adenomatous colon polyp 2017: due for surveillance at this time.   Plan: Colonoscopy with Dr. Gala Romney with conscious sedation.  ASA 2.  I have discussed the risks, alternatives, benefits with regards to but not limited to the risk of reaction to medication, bleeding, infection, perforation and the patient is agreeable to proceed. Written consent to be obtained. Continue pantoprazole 40 mg once to twice daily, use lowest effective dose. Continue Linzess 290 mcg daily for constipation.   Monitor for persisting abdominal pain or persisting "lump" in the right upper quadrant.  If notes, could consider further imaging via CT scan.

## 2021-03-12 ENCOUNTER — Telehealth: Payer: Self-pay | Admitting: *Deleted

## 2021-03-12 MED ORDER — PEG 3350-KCL-NA BICARB-NACL 420 G PO SOLR: ORAL | 0 refills | Status: DC

## 2021-03-12 NOTE — Telephone Encounter (Signed)
Called pt. She has been scheduled for TCS with Dr. Gala Romney, conscious sedation on 12/7 at 9:30am. Aware will mail prep instructions and send prep Rx to pharmacy. Confirmed address. Confirmed pharmacy.   PA approved via [facility]. Auth#  [id number], DOS Apr 15, 2021 - May 09, 2021

## 2021-03-13 ENCOUNTER — Other Ambulatory Visit: Payer: Self-pay | Admitting: Family Medicine

## 2021-03-13 NOTE — Telephone Encounter (Signed)
Refused topamax refill.   Notes from 11/19/2020 report that patient stopped meds due to numbness face, arms, hands and feet that stopped after med D/C.   Medication was changed to Gabapentin.

## 2021-03-16 ENCOUNTER — Telehealth: Payer: Self-pay

## 2021-03-16 MED ORDER — LINACLOTIDE 290 MCG PO CAPS: 290.0000 ug | ORAL_CAPSULE | Freq: Every day | ORAL | 3 refills | Status: DC

## 2021-03-16 NOTE — Addendum Note (Signed)
Addended by: Mahala Menghini on: 03/16/2021 09:58 AM   Modules accepted: Orders

## 2021-03-16 NOTE — Telephone Encounter (Signed)
Pt called stating that Linzess 290 mcg was supposed to be sent in to her pharmacy, but that the pharmacy has not received the rx.

## 2021-03-16 NOTE — Telephone Encounter (Signed)
Pt was made aware and verbalized understanding.  

## 2021-03-16 NOTE — Telephone Encounter (Signed)
At ov last week it was my understanding that she already had RX at pharmacy for the 241mcg that she was waiting to pick up. I have not tried to send one but I can.   I have sent RX to the last pharmacy checked in [place], [place].

## 2021-03-19 ENCOUNTER — Encounter: Payer: Self-pay | Admitting: Family Medicine

## 2021-03-19 ENCOUNTER — Ambulatory Visit (HOSPITAL_COMMUNITY)
Admission: RE | Admit: 2021-03-19 | Discharge: 2021-03-19 | Disposition: A | Discharge status: Home / Self Care | Payer: Medicare Other | Source: Ambulatory Visit | Attending: Family Medicine | Admitting: Family Medicine

## 2021-03-19 ENCOUNTER — Other Ambulatory Visit: Payer: Self-pay

## 2021-03-19 ENCOUNTER — Ambulatory Visit (INDEPENDENT_AMBULATORY_CARE_PROVIDER_SITE_OTHER): Payer: Medicare Other | Admitting: Family Medicine

## 2021-03-19 VITALS — BP 138/72 | HR 54 | Temp 97.7°F | Resp 18 | Ht 67.0 in | Wt 171.0 lb

## 2021-03-19 DIAGNOSIS — R0789 Other chest pain: Secondary | ICD-10-CM | POA: Insufficient documentation

## 2021-03-19 NOTE — Progress Notes (Signed)
Subjective:    Patient ID: [name], female    DOB: Oct 05, 1955, 65 y.o.   MRN: [id number] Patient reports right chest wall pain.  The pain is located below and around the right breast.  It is worse with lifting or straining.  It is worse when she lays on that side to try to sleep.  The pain comes and goes but certainly palpation and movement seems to exacerbate it.  She denies any cough or pleurisy or hemoptysis or shortness of breath.  She denies any nausea vomiting diarrhea or melena.  She denies any injuries although she does admit that her grandson recently ran and jumped into her arms and she may have strained something trying to pick him up.  She had a normal mammogram in August.  She has a history of cholecystectomy Past Medical History:  Diagnosis Date   Abdominal aortic aneurysm (AAA) 3.0 cm to 5.0 cm in diameter in female    None seen on prior imaging?    Anxiety    Cataract    Chronic left shoulder pain    Headache    Hiatal hernia    small   Hyperlipidemia    Hypertension    Pre-diabetes    Schatzki's ring    Seasonal allergies    Tremor    Past Surgical History:  Procedure Laterality Date   CARDIAC CATHETERIZATION  2005   "Ms. Guisinger has essentially normal coronary arteries and normal left ventricular function.  She will be treated empirically with anti-reflux measures"   CATARACT EXTRACTION, BILATERAL     CHOLECYSTECTOMY     COLONOSCOPY    06/16/2004   [id number] rectum/Diminutive polyps of the splenic flexure and the cecum/The remainder of the colonic mucosa appeared normal pathology (hyperplastic polyps).   COLONOSCOPY N/A 08/07/2015   Dr. Gala Romney: 6 mm descending colon tubular adenoma, multiple medium-mouthed diverticula in sigmoid colon. surveillance 2022   ESOPHAGOGASTRODUODENOSCOPY  09/11/2001   [id number] ring otherwise normal esophagus/Normal stomach/Normal D1 and D2 status post passage of a 56 French Maloney dilator   ESOPHAGOGASTRODUODENOSCOPY  N/A 11/11/2017   Procedure: ESOPHAGOGASTRODUODENOSCOPY (EGD);  Surgeon: Daneil Dolin, MD; mild Schatzki ring s/p dilation and mild erosive gastropathy.   ESOPHAGOGASTRODUODENOSCOPY (EGD) WITH ESOPHAGEAL DILATION N/A 01/25/2013   Dr. Gala Romney- schatzki's ring- 34 F dilation. small hiatal hernia   HYSTEROSCOPY WITH D & C N/A 03/06/2019   Procedure: DILATATION AND CURETTAGE /HYSTEROSCOPY;  Surgeon: Jonnie Kind, MD;  Location: [facility];  Service: Gynecology;  Laterality: N/A;   MALONEY DILATION N/A 11/11/2017   Procedure: [name] DILATION;  Surgeon: Daneil Dolin, MD;  [facility];  Service: Endoscopy;  Laterality: N/A;   POLYPECTOMY N/A 03/06/2019   Procedure: POLYPECTOMY ( REMOVAL OF ENDOMETRIAL POLYP);  Surgeon: Jonnie Kind, MD;  Location: [facility];  Service: Gynecology;  Laterality: N/A;   TUBAL LIGATION     Current Outpatient Medications on File Prior to Visit  Medication Sig Dispense Refill   acetaminophen (TYLENOL) 500 MG tablet Take 500 mg by mouth every 6 (six) hours as needed for moderate pain or headache.     ALPRAZolam (XANAX) 0.5 MG tablet TAKE 1 TABLET(0.5 MG) BY MOUTH TWICE DAILY AS NEEDED FOR ANXIETY 60 tablet 0   amLODipine (NORVASC) 10 MG tablet TAKE 1 TABLET(10 MG) BY MOUTH AT BEDTIME 90 tablet 3   aspirin EC 81 MG tablet Take 81 mg by mouth at bedtime.      cholecalciferol (VITAMIN D3) 25 MCG (  1000 UT) tablet Take 2,000 Units by mouth daily.     Cyanocobalamin (B-12) 2500 MCG TABS Take 2,500 mcg by mouth daily.     desoximetasone (TOPICORT) 0.25 % cream Apply topically.     doxazosin (CARDURA) 2 MG tablet Take 2 mg by mouth daily as needed.     EPINEPHrine 0.3 mg/0.3 mL IJ SOAJ injection Inject 0.3 mg into the muscle as needed for anaphylaxis.      gabapentin (NEURONTIN) 100 MG capsule TAKE 1 CAPSULE(100 MG) BY MOUTH THREE TIMES DAILY 90 capsule 0   hydrochlorothiazide (HYDRODIURIL) 25 MG tablet TAKE 1 TABLET(25 MG) BY MOUTH DAILY 30 tablet 11   hydroquinone 4  % cream Apply topically 2 (two) times daily as needed.     ipratropium (ATROVENT) 0.03 % nasal spray INSTILL 2 SPRAYS IN EACH NOSTRIL EVERY 12 HOURS 30 mL 12   ketoconazole (NIZORAL) 2 % cream Apply topically.     levocetirizine (XYZAL) 5 MG tablet Take 5 mg by mouth every evening.     linaclotide (LINZESS) 290 MCG CAPS capsule Take 1 capsule (290 mcg total) by mouth daily before breakfast. 90 capsule 3   loratadine (CLARITIN) 10 MG tablet Take 10 mg by mouth daily as needed for allergies.     MAGNESIUM PO Take 330 mg by mouth daily.     metoprolol succinate (TOPROL-XL) 100 MG 24 hr tablet Take 1 tablet (100 mg total) by mouth daily. 90 tablet 3   mometasone (ELOCON) 0.1 % cream Apply 1 application topically daily as needed (for irritation).      ondansetron (ZOFRAN) 4 MG tablet TAKE 1 TABLET(4 MG) BY MOUTH EVERY 8 HOURS AS NEEDED FOR NAUSEA OR VOMITING 30 tablet 0   pantoprazole (PROTONIX) 40 MG tablet Take 40 mg by mouth 2 (two) times daily before a meal.     polyethylene glycol-electrolytes (NULYTELY) 420 g solution As directed 4000 mL 0   potassium chloride SA (KLOR-CON) 20 MEQ tablet TAKE 1 TABLET(20 MEQ) BY MOUTH DAILY 30 tablet 5   rosuvastatin (CRESTOR) 5 MG tablet TAKE 1 TABLET(5 MG) BY MOUTH DAILY 90 tablet 3   sodium chloride (OCEAN) 0.65 % SOLN nasal spray Place 1 spray into both nostrils as needed for congestion.     tacrolimus (PROTOPIC) 0.1 % ointment Apply 1 application topically daily as needed (for irritation).      topiramate (TOPAMAX) 25 MG tablet Take by mouth 2 (two) times daily.     triamcinolone cream (KENALOG) 0.1 % APPLY EXTERNALLY TO THE AFFECTED AREA TWICE DAILY (Patient taking differently: Apply 1 application topically 2 (two) times daily as needed (Rash).) 45 g 0   valsartan (DIOVAN) 320 MG tablet TAKE 1 TABLET BY MOUTH EVERY DAY 90 tablet 1   vitamin C (ASCORBIC ACID) 250 MG tablet Take 500 mg by mouth daily.     vitamin E 400 UNIT capsule Take 400 Units by mouth  daily.     zinc gluconate 50 MG tablet Take 50 mg by mouth daily.     montelukast (SINGULAIR) 10 MG tablet TAKE 1 TABLET(10 MG) BY MOUTH AT BEDTIME (Patient not taking: Reported on 03/19/2021) 30 tablet 3   No current facility-administered medications on file prior to visit.   Allergies  Allergen Reactions   Ciprofloxacin Shortness Of Breath   Latex Other (See Comments)    Burns skin   Social History   Socioeconomic History   Marital status: Single    Spouse name: Not on file  Number of children: 3   Years of education: Not on file   Highest education level: Not on file  Occupational History   Occupation: English as a second language teacher: [employer]  Tobacco Use   Smoking status: Former    Packs/day: 0.25    Years: 35.00    Pack years: 8.75    Types: Cigarettes    Quit date: 12/31/2012    Years since quitting: 8.2   Smokeless tobacco: Never  Vaping Use   Vaping Use: Never used  Substance and Sexual Activity   Alcohol use: Not Currently   Drug use: No   Sexual activity: Not Currently    Partners: Male    Birth control/protection: Post-menopausal, Surgical    Comment: tubal  Other Topics Concern   Not on file  Social History Narrative   Eats all food groups.    Wears seatbelt.    Has 3 children.   Prior smoker.   Attends church.    Used to work in [employer].    Drives.   Enjoys going out with family.    Social Determinants of Health   Financial Resource Strain: Not on file  Food Insecurity: Not on file  Transportation Needs: Not on file  Physical Activity: Not on file  Stress: Not on file  Social Connections: Not on file  Intimate Partner Violence: Not on file   Family History  Problem Relation Age of Onset   Diabetes Other    Diabetes Mother    Heart disease Mother    Heart disease Father    Diabetes Sister    Diabetes Brother    Hypertension Brother    Diabetes Daughter    Hypertension Maternal Grandmother    Stroke Maternal Grandfather     Early death Paternal Grandfather    Colon cancer Neg Hx    Liver disease Neg Hx       Review of Systems  Neurological:  Positive for headaches.  All other systems reviewed and are negative.     Objective:   Physical Exam Vitals reviewed.  Constitutional:      Appearance: She is well-developed.  Eyes:     General: No visual field deficit.    Extraocular Movements:     Right eye: Normal extraocular motion and no nystagmus.     Left eye: Normal extraocular motion and no nystagmus.     Pupils:     Right eye: Pupil is round and reactive.     Left eye: Pupil is round and reactive.  Cardiovascular:     Rate and Rhythm: Normal rate and regular rhythm.     Heart sounds: Normal heart sounds. No murmur heard.   No friction rub. No gallop.  Pulmonary:     Effort: Pulmonary effort is normal. No respiratory distress.     Breath sounds: Normal breath sounds. No stridor. No wheezing or rales.  Chest:     Chest wall: No mass, deformity, swelling, tenderness or crepitus.    Abdominal:     General: Abdomen is flat. Bowel sounds are normal. There is no distension.     Palpations: Abdomen is soft.     Tenderness: There is no abdominal tenderness. There is no guarding.  Musculoskeletal:     Right hand: Normal. No swelling, deformity, tenderness or bony tenderness. Normal range of motion.     Left hand: Normal. No swelling, deformity, tenderness or bony tenderness. Normal range of motion.     Cervical back: Normal range of motion.  No rigidity.  Neurological:     Mental Status: She is alert and oriented to person, place, and time. Mental status is at baseline.     Cranial Nerves: No cranial nerve deficit, dysarthria or facial asymmetry.     Sensory: No sensory deficit.     Motor: Tremor present. No weakness or abnormal muscle tone.     Coordination: Romberg sign negative. Coordination normal.     Gait: Gait normal.          Assessment & Plan:  Chest wall pain - Plan: DG Chest 2  View, CBC with Differential/Platelet, COMPLETE METABOLIC PANEL WITH GFR I believe this is musculoskeletal chest wall pain.  Obtain an x-ray to rule out any underlying pulmonary pathology.  Palpation does not elicit pain today on exam so I do not feel that she is cracked a rib.  There is no relationship of the pain to food so I do not feel that this is an ulcer.  Her gallbladder has been removed.  I will obtain some baseline labs as well as a chest x-ray and then monitor the pain for the next 2 to 3 weeks.  If the pain goes away no further work-up is necessary assuming that chest x-ray and the labs are normal

## 2021-03-20 LAB — COMPLETE METABOLIC PANEL WITH GFR
AG Ratio: 1.7 (calc) (ref 1.0–2.5)
ALT: 12 U/L (ref 6–29)
AST: 13 U/L (ref 10–35)
Albumin: 4.3 g/dL (ref 3.6–5.1)
Alkaline phosphatase (APISO): 79 U/L (ref 37–153)
BUN: 9 mg/dL (ref 7–25)
CO2: 29 mmol/L (ref 20–32)
Calcium: 9.3 mg/dL (ref 8.6–10.4)
Chloride: 103 mmol/L (ref 98–110)
Creat: 0.84 mg/dL (ref 0.50–1.05)
Globulin: 2.5 g/dL (calc) (ref 1.9–3.7)
Glucose, Bld: 99 mg/dL (ref 65–99)
Potassium: 3.7 mmol/L (ref 3.5–5.3)
Sodium: 138 mmol/L (ref 135–146)
Total Bilirubin: 0.7 mg/dL (ref 0.2–1.2)
Total Protein: 6.8 g/dL (ref 6.1–8.1)
eGFR: 76 mL/min/{1.73_m2} (ref >=60)

## 2021-03-20 LAB — CBC WITH DIFFERENTIAL/PLATELET
Absolute Monocytes: 422 cells/uL (ref 200–950)
Basophils Absolute: 20 cells/uL (ref 0–200)
Basophils Relative: 0.3 %
Eosinophils Absolute: 161 cells/uL (ref 15–500)
Eosinophils Relative: 2.4 %
HCT: 39 % (ref 35.0–45.0)
Hemoglobin: 12.8 g/dL (ref 11.7–15.5)
Lymphs Abs: 3564 cells/uL (ref 850–3900)
MCH: 29.6 pg (ref 27.0–33.0)
MCHC: 32.8 g/dL (ref 32.0–36.0)
MCV: 90.3 fL (ref 80.0–100.0)
MPV: 10.1 fL (ref 7.5–12.5)
Monocytes Relative: 6.3 %
Neutro Abs: 2533 cells/uL (ref 1500–7800)
Neutrophils Relative %: 37.8 %
Platelets: 261 10*3/uL (ref 140–400)
RBC: 4.32 10*6/uL (ref 3.80–5.10)
RDW: 12.4 % (ref 11.0–15.0)
Total Lymphocyte: 53.2 %
WBC: 6.7 10*3/uL (ref 3.8–10.8)

## 2021-03-21 ENCOUNTER — Encounter (HOSPITAL_COMMUNITY): Payer: Self-pay | Admitting: Emergency Medicine

## 2021-03-21 ENCOUNTER — Emergency Department (HOSPITAL_COMMUNITY)
Admission: EM | Admit: 2021-03-21 | Discharge: 2021-03-21 | Disposition: A | Discharge status: Home / Self Care | Payer: Medicare Other | Attending: Emergency Medicine | Admitting: Emergency Medicine

## 2021-03-21 ENCOUNTER — Other Ambulatory Visit: Payer: Self-pay

## 2021-03-21 ENCOUNTER — Emergency Department (HOSPITAL_COMMUNITY): Payer: Medicare Other

## 2021-03-21 DIAGNOSIS — Z87891 Personal history of nicotine dependence: Secondary | ICD-10-CM | POA: Diagnosis not present

## 2021-03-21 DIAGNOSIS — Z79899 Other long term (current) drug therapy: Secondary | ICD-10-CM | POA: Insufficient documentation

## 2021-03-21 DIAGNOSIS — Z91040 Latex allergy status: Secondary | ICD-10-CM | POA: Diagnosis not present

## 2021-03-21 DIAGNOSIS — X58XXXA Exposure to other specified factors, initial encounter: Secondary | ICD-10-CM | POA: Insufficient documentation

## 2021-03-21 DIAGNOSIS — M545 Low back pain, unspecified: Secondary | ICD-10-CM | POA: Diagnosis not present

## 2021-03-21 DIAGNOSIS — S3992XA Unspecified injury of lower back, initial encounter: Secondary | ICD-10-CM | POA: Diagnosis present

## 2021-03-21 DIAGNOSIS — I10 Essential (primary) hypertension: Secondary | ICD-10-CM | POA: Diagnosis not present

## 2021-03-21 DIAGNOSIS — S39012A Strain of muscle, fascia and tendon of lower back, initial encounter: Secondary | ICD-10-CM | POA: Diagnosis not present

## 2021-03-21 DIAGNOSIS — Z7982 Long term (current) use of aspirin: Secondary | ICD-10-CM | POA: Insufficient documentation

## 2021-03-21 MED ORDER — METHOCARBAMOL 500 MG PO TABS: 500.0000 mg | ORAL_TABLET | Freq: Three times a day (TID) | ORAL | 0 refills | Status: DC | PRN

## 2021-03-21 MED ORDER — KETOROLAC TROMETHAMINE 60 MG/2ML IM SOLN
30.0000 mg | Freq: Once | INTRAMUSCULAR | Status: AC
  Administered 2021-03-21: 30 mg via INTRAMUSCULAR
  Filled 2021-03-21: qty 2

## 2021-03-21 NOTE — ED Provider Notes (Signed)
[facility] EMERGENCY DEPARTMENT Provider Note   CSN: [id number] Arrival date & time: 03/21/21  0003     History Chief Complaint  Patient presents with   Back Pain    [name] is a 65 y.o. female.  Lower back upper pelvis pain.  On and off again for the last day or 2 but woke up with a pretty bad today.  Worse with certain movements and touching.  No trauma, urinary symptoms, rash or previous history of the same.  No abdominal pain.  No radiation of pain.  No other associated symptoms.   Back Pain     Past Medical History:  Diagnosis Date   Abdominal aortic aneurysm (AAA) 3.0 cm to 5.0 cm in diameter in female    None seen on prior imaging?    Anxiety    Cataract    Chronic left shoulder pain    Headache    Hiatal hernia    small   Hyperlipidemia    Hypertension    Pre-diabetes    Schatzki's ring    Seasonal allergies    Tremor     Patient Active Problem List   Diagnosis Date Noted   Hyperlipidemia    Hypertension    Abdominal aortic aneurysm (AAA) 3.0 cm to 5.0 cm in diameter in female    Nausea without vomiting 12/06/2018   Loss of weight 12/06/2018   Controlled diabetes mellitus type 2 with complications ([place]) 88/50/2774   Essential hypertension 04/25/2017   Hyperlipidemia LDL goal <70 04/25/2017   Carotid artery stenosis without cerebral infarction, bilateral 04/25/2017   AAA (abdominal aortic aneurysm) without rupture 04/25/2017   History of colonic polyps    Diverticulosis of colon without hemorrhage    RUQ pain 02/21/2013   Abdominal pain, epigastric 01/05/2013   Abdominal bruit 01/05/2013   Esophageal dysphagia 01/05/2013   Constipation 01/05/2013   GERD (gastroesophageal reflux disease) 01/05/2013    Past Surgical History:  Procedure Laterality Date   CARDIAC CATHETERIZATION  2005   "Ms. Mobley has essentially normal coronary arteries and normal left ventricular function.  She will be treated empirically with anti-reflux  measures"   CATARACT EXTRACTION, BILATERAL     CHOLECYSTECTOMY     COLONOSCOPY    06/16/2004   JOI:NOMVEH rectum/Diminutive polyps of the splenic flexure and the cecum/The remainder of the colonic mucosa appeared normal pathology (hyperplastic polyps).   COLONOSCOPY N/A 08/07/2015   Dr. Gala Romney: 6 mm descending colon tubular adenoma, multiple medium-mouthed diverticula in sigmoid colon. surveillance 2022   ESOPHAGOGASTRODUODENOSCOPY  09/11/2001   MCN:OBSJGGEZM ring otherwise normal esophagus/Normal stomach/Normal D1 and D2 status post passage of a 56 French Maloney dilator   ESOPHAGOGASTRODUODENOSCOPY N/A 11/11/2017   Procedure: ESOPHAGOGASTRODUODENOSCOPY (EGD);  Surgeon: Daneil Dolin, MD; mild Schatzki ring s/p dilation and mild erosive gastropathy.   ESOPHAGOGASTRODUODENOSCOPY (EGD) WITH ESOPHAGEAL DILATION N/A 01/25/2013   Dr. Gala Romney- schatzki's ring- 22 F dilation. small hiatal hernia   HYSTEROSCOPY WITH D & C N/A 03/06/2019   Procedure: DILATATION AND CURETTAGE /HYSTEROSCOPY;  Surgeon: Jonnie Kind, MD;  Location: [facility]S;  Service: Gynecology;  Laterality: N/A;   MALONEY DILATION N/A 11/11/2017   Procedure: [name] DILATION;  Surgeon: Daneil Dolin, MD;  Location: [facility] ENDO SUITE;  Service: Endoscopy;  Laterality: N/A;   POLYPECTOMY N/A 03/06/2019   Procedure: POLYPECTOMY ( REMOVAL OF ENDOMETRIAL POLYP);  Surgeon: Jonnie Kind, MD;  Location: [facility] ORS;  Service: Gynecology;  Laterality: N/A;   TUBAL LIGATION  OB History     Gravida  4   Para  3   Term  3   Preterm      AB  1   Living  3      SAB  1   IAB      Ectopic      Multiple      Live Births              Family History  Problem Relation Age of Onset   Diabetes Other    Diabetes Mother    Heart disease Mother    Heart disease Father    Diabetes Sister    Diabetes Brother    Hypertension Brother    Diabetes Daughter    Hypertension Maternal Grandmother    Stroke Maternal Grandfather     Early death Paternal Grandfather    Colon cancer Neg Hx    Liver disease Neg Hx     Social History   Tobacco Use   Smoking status: Former    Packs/day: 0.25    Years: 35.00    Pack years: 8.75    Types: Cigarettes    Quit date: 12/31/2012    Years since quitting: 8.2   Smokeless tobacco: Never  Vaping Use   Vaping Use: Never used  Substance Use Topics   Alcohol use: Not Currently   Drug use: No    Home Medications Prior to Admission medications   Medication Sig Start Date End Date Taking? Authorizing Provider  methocarbamol (ROBAXIN) 500 MG tablet Take 1 tablet (500 mg total) by mouth every 8 (eight) hours as needed for muscle spasms. 03/21/21  Yes Vail Basista, Corene Cornea, MD  acetaminophen (TYLENOL) 500 MG tablet Take 500 mg by mouth every 6 (six) hours as needed for moderate pain or headache.    [provider]  ALPRAZolam (XANAX) 0.5 MG tablet TAKE 1 TABLET(0.5 MG) BY MOUTH TWICE DAILY AS NEEDED FOR ANXIETY 12/15/20   Susy Frizzle, MD  amLODipine (NORVASC) 10 MG tablet TAKE 1 TABLET(10 MG) BY MOUTH AT BEDTIME 09/17/20   Susy Frizzle, MD  aspirin EC 81 MG tablet Take 81 mg by mouth at bedtime.     [provider]  cholecalciferol (VITAMIN D3) 25 MCG (1000 UT) tablet Take 2,000 Units by mouth daily.    [provider]  Cyanocobalamin (B-12) 2500 MCG TABS Take 2,500 mcg by mouth daily.    [provider]  desoximetasone (TOPICORT) 0.25 % cream Apply topically. 01/10/21   [provider]  doxazosin (CARDURA) 2 MG tablet Take 2 mg by mouth daily as needed. 01/20/21   [provider]  EPINEPHrine 0.3 mg/0.3 mL IJ SOAJ injection Inject 0.3 mg into the muscle as needed for anaphylaxis.  01/05/19   [provider]  gabapentin (NEURONTIN) 100 MG capsule TAKE 1 CAPSULE(100 MG) BY MOUTH THREE TIMES DAILY 12/18/20   Susy Frizzle, MD  hydrochlorothiazide (HYDRODIURIL) 25 MG tablet TAKE 1 TABLET(25 MG) BY MOUTH DAILY 10/08/20    Susy Frizzle, MD  hydroquinone 4 % cream Apply topically 2 (two) times daily as needed. 01/05/21   [provider]  ipratropium (ATROVENT) 0.03 % nasal spray INSTILL 2 SPRAYS IN EACH NOSTRIL EVERY 12 HOURS 03/07/20   Susy Frizzle, MD  ketoconazole (NIZORAL) 2 % cream Apply topically. 01/05/21   [provider]  levocetirizine (XYZAL) 5 MG tablet Take 5 mg by mouth every evening. 12/20/19   [provider]  linaclotide (  LINZESS) 290 MCG CAPS capsule Take 1 capsule (290 mcg total) by mouth daily before breakfast. 03/16/21   Mahala Menghini, PA-C  loratadine (CLARITIN) 10 MG tablet Take 10 mg by mouth daily as needed for allergies.    [provider]  MAGNESIUM PO Take 330 mg by mouth daily.    [provider]  metoprolol succinate (TOPROL-XL) 100 MG 24 hr tablet Take 1 tablet (100 mg total) by mouth daily. 05/29/20   Susy Frizzle, MD  mometasone (ELOCON) 0.1 % cream Apply 1 application topically daily as needed (for irritation).  01/05/19   [provider]  montelukast (SINGULAIR) 10 MG tablet TAKE 1 TABLET(10 MG) BY MOUTH AT BEDTIME Patient not taking: Reported on 03/19/2021 08/13/19   Susy Frizzle, MD  ondansetron (ZOFRAN) 4 MG tablet TAKE 1 TABLET(4 MG) BY MOUTH EVERY 8 HOURS AS NEEDED FOR NAUSEA OR VOMITING 03/06/21   Mahala Menghini, PA-C  pantoprazole (PROTONIX) 40 MG tablet Take 40 mg by mouth 2 (two) times daily before a meal.    [provider]  polyethylene glycol-electrolytes (NULYTELY) 420 g solution As directed 03/12/21   [name], Cristopher Estimable, MD  potassium chloride SA (KLOR-CON) 20 MEQ tablet TAKE 1 TABLET(20 MEQ) BY MOUTH DAILY 12/18/20   Susy Frizzle, MD  rosuvastatin (CRESTOR) 5 MG tablet TAKE 1 TABLET(5 MG) BY MOUTH DAILY 09/17/20   Susy Frizzle, MD  sodium chloride (OCEAN) 0.65 % SOLN nasal spray Place 1 spray into both nostrils as needed for congestion.    [provider]  tacrolimus (PROTOPIC)  0.1 % ointment Apply 1 application topically daily as needed (for irritation).  01/08/19   [provider]  topiramate (TOPAMAX) 25 MG tablet Take by mouth 2 (two) times daily. 02/01/21   [provider]  triamcinolone cream (KENALOG) 0.1 % APPLY EXTERNALLY TO THE AFFECTED AREA TWICE DAILY Patient taking differently: Apply 1 application topically 2 (two) times daily as needed (Rash). 12/28/18   Susy Frizzle, MD  valsartan (DIOVAN) 320 MG tablet TAKE 1 TABLET BY MOUTH EVERY DAY 11/05/20   Susy Frizzle, MD  vitamin C (ASCORBIC ACID) 250 MG tablet Take 500 mg by mouth daily.    [provider]  vitamin E 400 UNIT capsule Take 400 Units by mouth daily.    [provider]  zinc gluconate 50 MG tablet Take 50 mg by mouth daily.    [provider]    Allergies    Ciprofloxacin and Latex  Review of Systems   Review of Systems  Musculoskeletal:  Positive for back pain.  All other systems reviewed and are negative.  Physical Exam Updated Vital Signs BP (!) 122/57   Pulse (!) 58   Temp 98.5 F (36.9 C) (Oral)   Resp 17   Ht 5\' 7"  (1.702 m)   Wt 78 kg   SpO2 99%   BMI 26.93 kg/m   Physical Exam Vitals and nursing note reviewed.  Constitutional:      Appearance: She is well-developed.  HENT:     Head: Normocephalic and atraumatic.     Mouth/Throat:     Mouth: Mucous membranes are moist.     Pharynx: Oropharynx is clear.  Eyes:     Pupils: Pupils are equal, round, and reactive to light.  Cardiovascular:     Rate and Rhythm: Normal rate and regular rhythm.  Pulmonary:     Effort: No respiratory distress.     Breath sounds:  No stridor.  Abdominal:     General: Abdomen is flat. There is no distension.  Musculoskeletal:     Cervical back: Normal range of motion.  Skin:    General: Skin is warm and dry.  Neurological:     General: No focal deficit present.     Mental Status: She is alert.    ED Results / Procedures / Treatments    Labs (all labs ordered are listed, but only abnormal results are displayed) Labs Reviewed - No data to display  EKG None  Radiology DG Chest 2 View  Result Date: 03/20/2021 CLINICAL DATA:  Right anterior chest wall pain; technologist note states pain under breast for 1 month on and off. No known injury. EXAM: CHEST - 2 VIEW COMPARISON:  X-ray chest 08/18/2020. FINDINGS: The heart size and mediastinal contours are within normal limits. Both lungs are clear. No pleural effusion or pneumothorax. The visualized skeletal structures are unremarkable. IMPRESSION: No acute process in the chest. Electronically Signed   By: Macy Mis M.D.   On: 03/20/2021 08:15   DG Pelvis 1-2 Views  Result Date: 03/21/2021 CLINICAL DATA:  Low back pain EXAM: PELVIS - 1-2 VIEW COMPARISON:  None. FINDINGS: There is no evidence of pelvic fracture or diastasis. No pelvic bone lesions are seen. Vascular calcifications are seen within the pelvis. IMPRESSION: Negative. Electronically Signed   By: Fidela Salisbury M.D.   On: 03/21/2021 01:39    Procedures Procedures   Medications Ordered in ED Medications  ketorolac (TORADOL) injection 30 mg (30 mg Intramuscular Given 03/21/21 0130)    ED Course  I have reviewed the triage vital signs and the nursing notes.  Pertinent labs & imaging results that were available during my care of the patient were reviewed by me and considered in my medical decision making (see chart for details).    MDM Rules/Calculators/A&P                         Patient's back pain seems very muscular.  Improved with medications here.  Her chart says she has a history of a AAA however CT scan done a year ago did not show any aneurysmal abnormalities and she has no evidence for AAA, dissection or other aortic pathology at this time.  Her blood pressure improved as her pain improved.  Stable for discharge with PCP follow-up as needed.  Final Clinical Impression(s) / ED Diagnoses Final  diagnoses:  Strain of lumbar region, initial encounter    Rx / DC Orders ED Discharge Orders          Ordered    methocarbamol (ROBAXIN) 500 MG tablet  Every 8 hours PRN        03/21/21 0207             Jaylend Reiland, Corene Cornea, MD 03/21/21 [phone]

## 2021-03-21 NOTE — ED Triage Notes (Addendum)
Pt with c/o lower back pain. States pain is intermittent and had gone away but then she woke up and was "achy all over" and checked her BP and noticed that it was elevated (208/80).

## 2021-04-15 ENCOUNTER — Encounter (HOSPITAL_COMMUNITY)
Admission: RE | Disposition: A | Discharge status: Home / Self Care | Payer: Self-pay | Source: Home / Self Care | Attending: Internal Medicine

## 2021-04-15 ENCOUNTER — Other Ambulatory Visit: Payer: Self-pay

## 2021-04-15 ENCOUNTER — Encounter (HOSPITAL_COMMUNITY): Payer: Self-pay | Admitting: Internal Medicine

## 2021-04-15 ENCOUNTER — Ambulatory Visit (HOSPITAL_COMMUNITY)
Admission: RE | Admit: 2021-04-15 | Discharge: 2021-04-15 | Disposition: A | Discharge status: Home / Self Care | Payer: Medicare Other | Attending: Internal Medicine | Admitting: Internal Medicine

## 2021-04-15 DIAGNOSIS — D126 Benign neoplasm of colon, unspecified: Secondary | ICD-10-CM | POA: Diagnosis not present

## 2021-04-15 DIAGNOSIS — Z86010 Personal history of colonic polyps: Secondary | ICD-10-CM

## 2021-04-15 DIAGNOSIS — D122 Benign neoplasm of ascending colon: Secondary | ICD-10-CM | POA: Diagnosis not present

## 2021-04-15 DIAGNOSIS — Z79899 Other long term (current) drug therapy: Secondary | ICD-10-CM | POA: Diagnosis not present

## 2021-04-15 DIAGNOSIS — K5909 Other constipation: Secondary | ICD-10-CM | POA: Insufficient documentation

## 2021-04-15 DIAGNOSIS — Z87891 Personal history of nicotine dependence: Secondary | ICD-10-CM | POA: Insufficient documentation

## 2021-04-15 DIAGNOSIS — Z1211 Encounter for screening for malignant neoplasm of colon: Secondary | ICD-10-CM | POA: Insufficient documentation

## 2021-04-15 DIAGNOSIS — K5730 Diverticulosis of large intestine without perforation or abscess without bleeding: Secondary | ICD-10-CM | POA: Diagnosis not present

## 2021-04-15 HISTORY — PX: POLYPECTOMY: SHX5525

## 2021-04-15 HISTORY — PX: COLONOSCOPY: SHX5424

## 2021-04-15 SURGERY — COLONOSCOPY
Anesthesia: Moderate Sedation

## 2021-04-15 MED ORDER — MIDAZOLAM HCL 5 MG/5ML IJ SOLN
INTRAMUSCULAR | Status: DC | PRN
  Administered 2021-04-15: 2 mg via INTRAVENOUS
  Administered 2021-04-15 (×2): 1 mg via INTRAVENOUS
  Administered 2021-04-15: 2 mg via INTRAVENOUS

## 2021-04-15 MED ORDER — ONDANSETRON HCL 4 MG/2ML IJ SOLN
INTRAMUSCULAR | Status: DC | PRN
  Administered 2021-04-15: 4 mg via INTRAVENOUS

## 2021-04-15 MED ORDER — SODIUM CHLORIDE 0.9 % IV SOLN
INTRAVENOUS | Status: DC
  Administered 2021-04-15: 1000 mL via INTRAVENOUS

## 2021-04-15 MED ORDER — MEPERIDINE HCL 50 MG/ML IJ SOLN
INTRAMUSCULAR | Status: AC
  Filled 2021-04-15: qty 1

## 2021-04-15 MED ORDER — MIDAZOLAM HCL 5 MG/5ML IJ SOLN
INTRAMUSCULAR | Status: AC
  Filled 2021-04-15: qty 10

## 2021-04-15 MED ORDER — MEPERIDINE HCL 100 MG/ML IJ SOLN
INTRAMUSCULAR | Status: DC | PRN
  Administered 2021-04-15: 25 mg via INTRAVENOUS

## 2021-04-15 MED ORDER — ONDANSETRON HCL 4 MG/2ML IJ SOLN
INTRAMUSCULAR | Status: AC
  Filled 2021-04-15: qty 2

## 2021-04-15 NOTE — Op Note (Signed)
[facility] Patient Name: [name] Procedure Date: 04/15/2021 8:27 AM MRN: [id number] Date of Birth: 1955/08/03 Attending MD: Norvel Richards , MD CSN: [id number] Age: 65 Admit Type: Outpatient Procedure:                Colonoscopy Indications:              High risk colon cancer surveillance: Personal                            history of colonic polyps Providers:                Norvel Richards, MD, Caprice Kluver, Randa Spike, Technician Referring MD:              Medicines:                Midazolam 6 mg IV, Meperidine 25 mg IV Complications:            No immediate complications. Estimated Blood Loss:     Estimated blood loss was minimal. Procedure:                Pre-Anesthesia Assessment:                           - Prior to the procedure, a History and Physical                            was performed, and patient medications and                            allergies were reviewed. The patient's tolerance of                            previous anesthesia was also reviewed. The risks                            and benefits of the procedure and the sedation                            options and risks were discussed with the patient.                            All questions were answered, and informed consent                            was obtained. Prior Anticoagulants: The patient has                            taken no previous anticoagulant or antiplatelet                            agents. ASA Grade Assessment: II - A patient with  mild systemic disease. After reviewing the risks                            and benefits, the patient was deemed in                            satisfactory condition to undergo the procedure.                           After obtaining informed consent, the colonoscope                            was passed under direct vision. Throughout the                             procedure, the patient's blood pressure, pulse, and                            oxygen saturations were monitored continuously. The                            [id number]) scope was introduced through the                            anus and advanced to the the cecum, identified by                            appendiceal orifice and ileocecal valve. The                            colonoscopy was performed without difficulty. The                            patient tolerated the procedure well. The quality                            of the bowel preparation was adequate. Scope In: 9:01:20 AM Scope Out: 9:24:24 AM Scope Withdrawal Time: 0 hours 14 minutes 57 seconds  Total Procedure Duration: 0 hours 23 minutes 4 seconds  Findings:      The perianal and digital rectal examinations were normal.      Scattered small and large-mouthed diverticula were found in the sigmoid       colon.      Four semi-pedunculated polyps were found in the sigmoid colon and       ascending colon. The polyps were 4 to 6 mm in size. These polyps were       removed with a cold snare. Resection and retrieval were complete.       Estimated blood loss was minimal.      The exam was otherwise without abnormality on direct and retroflexion       views. Impression:               - Diverticulosis in the sigmoid colon.                           -  Four 4 to 6 mm polyps in the sigmoid colon and in                            the ascending colon, removed with a cold snare.                            Resected and retrieved.                           - The examination was otherwise normal on direct                            and retroflexion views. Moderate Sedation:      Moderate (conscious) sedation was administered by the endoscopy nurse       and supervised by the endoscopist. The following parameters were       monitored: oxygen saturation, heart rate, blood pressure, respiratory       rate, EKG, adequacy of pulmonary  ventilation, and response to care.       Total physician intraservice time was 28 minutes. Recommendation:           - Patient has a contact number available for                            emergencies. The signs and symptoms of potential                            delayed complications were discussed with the                            patient. Return to normal activities tomorrow.                            Written discharge instructions were provided to the                            patient.                           - Resume previous diet.                           - Continue present medications.                           - Repeat colonoscopy date to be determined after                            pending pathology results are reviewed for                            surveillance.                           - Return to GI office (date not yet determined). Procedure Code(s):        --- Professional ---  [phone], Colonoscopy, flexible; with removal of                            tumor(s), polyp(s), or other lesion(s) by snare                            technique                           99153, Moderate sedation; each additional 15                            minutes intraservice time                           G0500, Moderate sedation services provided by the                            same physician or other qualified health care                            professional performing a gastrointestinal                            endoscopic service that sedation supports,                            requiring the presence of an independent trained                            observer to assist in the monitoring of the                            patient's level of consciousness and physiological                            status; initial 15 minutes of intra-service time;                            patient age 55 years or older (additional time may                             be reported with [phone], as appropriate) Diagnosis Code(s):        --- Professional ---                           Z86.010, Personal history of colonic polyps                           K63.5, Polyp of colon                           K57.30, Diverticulosis of large intestine without                            perforation  or abscess without bleeding CPT copyright 2019 [employer]. All rights reserved. The codes documented in this report are preliminary and upon coder review may  be revised to meet current compliance requirements. Cristopher Estimable. Simra Fiebig, MD Norvel Richards, MD 04/15/2021 9:37:52 AM This report has been signed electronically. Number of Addenda: 0

## 2021-04-15 NOTE — Discharge Instructions (Signed)
  Colonoscopy Discharge Instructions  Read the instructions outlined below and refer to this sheet in the next few weeks. These discharge instructions provide you with general information on caring for yourself after you leave the hospital. Your doctor may also give you specific instructions. While your treatment has been planned according to the most current medical practices available, unavoidable complications occasionally occur. If you have any problems or questions after discharge, call Dr. Gala Romney at [phone]. ACTIVITY You may resume your regular activity, but move at a slower pace for the next 24 hours.  Take frequent rest periods for the next 24 hours.  Walking will help get rid of the air and reduce the bloated feeling in your belly (abdomen).  No driving for 24 hours (because of the medicine (anesthesia) used during the test).   Do not sign any important legal documents or operate any machinery for 24 hours (because of the anesthesia used during the test).  NUTRITION Drink plenty of fluids.  You may resume your normal diet as instructed by your doctor.  Begin with a light meal and progress to your normal diet. Heavy or fried foods are harder to digest and may make you feel sick to your stomach (nauseated).  Avoid alcoholic beverages for 24 hours or as instructed.  MEDICATIONS You may resume your normal medications unless your doctor tells you otherwise.  WHAT YOU CAN EXPECT TODAY Some feelings of bloating in the abdomen.  Passage of more gas than usual.  Spotting of blood in your stool or on the toilet paper.  IF YOU HAD POLYPS REMOVED DURING THE COLONOSCOPY: No aspirin products for 7 days or as instructed.  No alcohol for 7 days or as instructed.  Eat a soft diet for the next 24 hours.  FINDING OUT THE RESULTS OF YOUR TEST Not all test results are available during your visit. If your test results are not back during the visit, make an appointment with your caregiver to find out the  results. Do not assume everything is normal if you have not heard from your caregiver or the medical facility. It is important for you to follow up on all of your test results.  SEEK IMMEDIATE MEDICAL ATTENTION IF: You have more than a spotting of blood in your stool.  Your belly is swollen (abdominal distention).  You are nauseated or vomiting.  You have a temperature over 101.  You have abdominal pain or discomfort that is severe or gets worse throughout the day.     3 polyps removed in your colon  Polyp and diverticulosis information provided  Further recommendations to follow pending review of pathology  At patient request, I called Katrina at [phone]-reviewed findings and recommendations

## 2021-04-15 NOTE — H&P (Signed)
@LOGO @   Primary Care Physician:  Susy Frizzle, MD Primary Gastroenterologist:  Dr. Gala Romney  Pre-Procedure History & Physical: HPI:  [name] is a 65 y.o. female here for surveillance colonoscopy.  History of colonic adenoma removed 5 years ago.  Here for follow-up examination. Chronic constipation managed well with Linzess. Past Medical History:  Diagnosis Date   Abdominal aortic aneurysm (AAA) 3.0 cm to 5.0 cm in diameter in female    None seen on prior imaging?    Anxiety    Cataract    Chronic left shoulder pain    Headache    Hiatal hernia    small   Hyperlipidemia    Hypertension    Pre-diabetes    Schatzki's ring    Seasonal allergies    Tremor     Past Surgical History:  Procedure Laterality Date   CARDIAC CATHETERIZATION  2005   "[name] has essentially normal coronary arteries and normal left ventricular function.  She will be treated empirically with anti-reflux measures"   CATARACT EXTRACTION, BILATERAL     CHOLECYSTECTOMY     COLONOSCOPY    06/16/2004   ERX:[id number] rectum/Diminutive polyps of the splenic flexure and the cecum/The remainder of the colonic mucosa appeared normal pathology (hyperplastic polyps).   COLONOSCOPY N/A 08/07/2015   Dr. Gala Romney: 6 mm descending colon tubular adenoma, multiple medium-mouthed diverticula in sigmoid colon. surveillance 2022   ESOPHAGOGASTRODUODENOSCOPY  09/11/2001   PYP:[id number] ring otherwise normal esophagus/Normal stomach/Normal D1 and D2 status post passage of a 56 French Maloney dilator   ESOPHAGOGASTRODUODENOSCOPY N/A 11/11/2017   Procedure: ESOPHAGOGASTRODUODENOSCOPY (EGD);  Surgeon: Daneil Dolin, MD; mild Schatzki ring s/p dilation and mild erosive gastropathy.   ESOPHAGOGASTRODUODENOSCOPY (EGD) WITH ESOPHAGEAL DILATION N/A 01/25/2013   Dr. Gala Romney- schatzki's ring- 51 F dilation. small hiatal hernia   HYSTEROSCOPY WITH D & C N/A 03/06/2019   Procedure: DILATATION AND CURETTAGE /HYSTEROSCOPY;   Surgeon: Jonnie Kind, MD;  Location: [facility];  Service: Gynecology;  Laterality: N/A;   MALONEY DILATION N/A 11/11/2017   Procedure: [name] DILATION;  Surgeon: Daneil Dolin, MD;  Location: [facility];  Service: Endoscopy;  Laterality: N/A;   POLYPECTOMY N/A 03/06/2019   Procedure: POLYPECTOMY ( REMOVAL OF ENDOMETRIAL POLYP);  Surgeon: Jonnie Kind, MD;  Location: [facility];  Service: Gynecology;  Laterality: N/A;   TUBAL LIGATION      Prior to Admission medications   Medication Sig Start Date End Date Taking? Authorizing Provider  acetaminophen (TYLENOL) 500 MG tablet Take 1,000 mg by mouth every 6 (six) hours as needed for moderate pain or headache.   Yes [provider]  ALPRAZolam (XANAX) 0.5 MG tablet TAKE 1 TABLET(0.5 MG) BY MOUTH TWICE DAILY AS NEEDED FOR ANXIETY 12/15/20  Yes Susy Frizzle, MD  amLODipine (NORVASC) 10 MG tablet TAKE 1 TABLET(10 MG) BY MOUTH AT BEDTIME 09/17/20  Yes Susy Frizzle, MD  aspirin EC 81 MG tablet Take 81 mg by mouth at bedtime.    Yes [provider]  Calcium 500 MG tablet Take 500 mg by mouth in the morning and at bedtime.   Yes [provider]  cholecalciferol (VITAMIN D3) 25 MCG (1000 UT) tablet Take 2,000 Units by mouth daily.   Yes [provider]  cloNIDine (CATAPRES) 0.1 MG tablet Take 0.1 mg by mouth daily as needed (High Blood Pressure).   Yes [provider]  Coenzyme Q10 (COQ10) 100 MG CAPS Take 100 mg by mouth daily.  Yes [provider]  CRANBERRY PO Take 1 each by mouth 2 (two) times a week. Chewables   Yes [provider]  Cyanocobalamin (B-12) 2500 MCG TABS Take 2,500 mcg by mouth daily.   Yes [provider]  desoximetasone (TOPICORT) 0.25 % cream Apply 1 application topically daily as needed (irritation). 01/10/21  Yes [provider]  EPINEPHrine 0.3 mg/0.3 mL IJ SOAJ injection Inject 0.3 mg into the muscle as needed for anaphylaxis.  01/05/19  Yes  [provider]  fluticasone 0.05%-ketoconazole 2% 1:1 cream mixture Apply 1 application topically daily as needed for irritation.   Yes [provider]  gentamicin ointment (GARAMYCIN) 0.1 % Apply 1 application topically daily as needed (nasal bleeds).   Yes [provider]  hydrochlorothiazide (HYDRODIURIL) 25 MG tablet TAKE 1 TABLET(25 MG) BY MOUTH DAILY Patient taking differently: Take 12.5-25 mg by mouth daily as needed (Swelling/fluid). 10/08/20  Yes Susy Frizzle, MD  hydroquinone 4 % cream Apply 1 application topically 2 (two) times daily as needed (dark spots). 01/05/21  Yes [provider]  linaclotide Rolan Lipa) 290 MCG CAPS capsule Take 1 capsule (290 mcg total) by mouth daily before breakfast. 03/16/21  Yes Mahala Menghini, PA-C  loratadine (CLARITIN) 10 MG tablet Take 10 mg by mouth daily as needed for allergies.   Yes [provider]  MAGNESIUM PO Take 330 mg by mouth daily.   Yes [provider]  methocarbamol (ROBAXIN) 500 MG tablet Take 1 tablet (500 mg total) by mouth every 8 (eight) hours as needed for muscle spasms. 03/21/21  Yes Mesner, Corene Cornea, MD  metoprolol succinate (TOPROL-XL) 100 MG 24 hr tablet Take 1 tablet (100 mg total) by mouth daily. 05/29/20  Yes Susy Frizzle, MD  Omega-3 Fatty Acids (FISH OIL) 1000 MG CAPS Take 1,000 mg by mouth 3 (three) times a week.   Yes [provider]  pantoprazole (PROTONIX) 40 MG tablet Take 40 mg by mouth 2 (two) times daily before a meal.   Yes [provider]  polyethylene glycol-electrolytes (NULYTELY) 420 g solution As directed 03/12/21  Yes Aston Lawhorn, Cristopher Estimable, MD  potassium chloride SA (KLOR-CON) 20 MEQ tablet TAKE 1 TABLET(20 MEQ) BY MOUTH DAILY 12/18/20  Yes Susy Frizzle, MD  rosuvastatin (CRESTOR) 5 MG tablet TAKE 1 TABLET(5 MG) BY MOUTH DAILY 09/17/20  Yes Susy Frizzle, MD  sodium chloride (OCEAN) 0.65 % SOLN nasal spray Place 1 spray into both nostrils  as needed for congestion.   Yes [provider]  triamcinolone (NASACORT) 55 MCG/ACT AERO nasal inhaler Place 1 spray into the nose daily as needed (allergies).   Yes [provider]  valsartan (DIOVAN) 320 MG tablet TAKE 1 TABLET BY MOUTH EVERY DAY 11/05/20  Yes Susy Frizzle, MD  vitamin E 400 UNIT capsule Take 400 Units by mouth 3 (three) times a week.   Yes [provider]  zinc gluconate 50 MG tablet Take 50 mg by mouth daily.   Yes [provider]  Carboxymethylcellul-Glycerin (CLEAR EYES FOR DRY EYES) 1-0.25 % SOLN Place 1 drop into both eyes daily as needed (dry eyes).    [provider]  levocetirizine (XYZAL) 5 MG tablet Take 5 mg by mouth every evening. 12/20/19   [provider]  ondansetron (ZOFRAN) 4 MG tablet TAKE 1 TABLET(4 MG) BY MOUTH EVERY 8 HOURS AS NEEDED FOR NAUSEA OR VOMITING 03/06/21   Mahala Menghini, PA-C    Allergies as of 03/12/2021 -  Review Complete 03/11/2021  Allergen Reaction Noted   Ciprofloxacin Shortness Of Breath 12/27/2011   Latex Other (See Comments) 10/17/2019    Family History  Problem Relation Age of Onset   Diabetes Other    Diabetes Mother    Heart disease Mother    Heart disease Father    Diabetes Sister    Diabetes Brother    Hypertension Brother    Diabetes Daughter    Hypertension Maternal Grandmother    Stroke Maternal Grandfather    Early death Paternal Grandfather    Colon cancer Neg Hx    Liver disease Neg Hx     Social History   Socioeconomic History   Marital status: Single    Spouse name: Not on file   Number of children: 3   Years of education: Not on file   Highest education level: Not on file  Occupational History   Occupation: English as a second language teacher: [employer]  Tobacco Use   Smoking status: Former    Packs/day: 0.25    Years: 35.00    Pack years: 8.75    Types: Cigarettes    Quit date: 12/31/2012    Years since quitting: 8.2   Smokeless  tobacco: Never  Vaping Use   Vaping Use: Never used  Substance and Sexual Activity   Alcohol use: Not Currently   Drug use: No   Sexual activity: Not Currently    Partners: Male    Birth control/protection: Post-menopausal, Surgical    Comment: tubal  Other Topics Concern   Not on file  Social History Narrative   Eats all food groups.    Wears seatbelt.    Has 3 children.   Prior smoker.   Attends church.    Used to work in [employer].    Drives.   Enjoys going out with family.    Social Determinants of Health   Financial Resource Strain: Not on file  Food Insecurity: Not on file  Transportation Needs: Not on file  Physical Activity: Not on file  Stress: Not on file  Social Connections: Not on file  Intimate Partner Violence: Not on file    Review of Systems: See HPI, otherwise negative ROS  Physical Exam: There were no vitals taken for this visit. General:   Alert,  Well-developed, well-nourished, pleasant and cooperative in NAD Mouth:  No deformity or lesions. Neck:  Supple; no masses or thyromegaly. No significant cervical adenopathy. Lungs:  Clear throughout to auscultation.   No wheezes, crackles, or rhonchi. No acute distress. Heart:  Regular rate and rhythm; no murmurs, clicks, rubs,  or gallops. Abdomen: Non-distended, normal bowel sounds.  Soft and nontender without appreciable mass or hepatosplenomegaly.  Pulses:  Normal pulses noted. Extremities:  Without clubbing or edema.  Impression/Plan: 65 year old lady with a history of colonic adenoma.  Here for surveillance examination and symptoms of constipation well managed with Linzess.  I have offered this nice lady a surveillance colonoscopy today per plan. The risks, benefits, limitations, alternatives and imponderables have been reviewed with the patient. Questions have been answered. All parties are agreeable.       Notice: This dictation was prepared with Dragon dictation along with smaller phrase  technology. Any transcriptional errors that result from this process are unintentional and may not be corrected upon review.

## 2021-04-17 ENCOUNTER — Other Ambulatory Visit: Payer: Self-pay | Admitting: Family Medicine

## 2021-04-17 ENCOUNTER — Encounter: Payer: Self-pay | Admitting: Internal Medicine

## 2021-04-17 ENCOUNTER — Encounter (HOSPITAL_COMMUNITY): Payer: Self-pay | Admitting: Internal Medicine

## 2021-04-17 DIAGNOSIS — J329 Chronic sinusitis, unspecified: Secondary | ICD-10-CM

## 2021-04-17 LAB — SURGICAL PATHOLOGY

## 2021-04-17 NOTE — Progress Notes (Signed)
done

## 2021-04-21 ENCOUNTER — Telehealth: Payer: Self-pay | Admitting: Neurology

## 2021-04-21 NOTE — Progress Notes (Deleted)
Assessment/Plan:    1.  Essential Tremor  -Patient did not tolerate primidone. Already on metoprolol (and a host of other antihypertensives). At this point in time, tremor not that bothersome to patient and I really would not recommend other medicines given risk: Benefit ratio. Patient agreed.  2.  HTN  -Being managed by cardiology and primary care.  On multiple antihypertensives.  Nocturnal headache felt to be a result of this. She was a little frustrated about this and asked several questions. She wanted me to manage headache, but I feel strongly that morning headache is a result of her blood pressure. She admits she is waking up with systolics in the 497W. We talked about raising the head of the bed and sleeping on a wedge pillow, but ultimately she needs blood pressure control, which is far out of my area of expertise. Discussed that while our office does primary headache management, we do not do secondary headache management (headache caused by a blood pressure issue). The treatment of this is controlling the blood pressure. She states that the emergency room told her to follow-up with me. I apologized for this, but did tell her that based on primary care notes, I feel that he certainly recognize that her morning headache was a result of her blood pressure and has been working hard to try to control it.  3.  Diffuse pain  -Primary care working up for autoimmune etiology.  Nothing focal or lateralizing on examination to suggest neurologic etiology.  Subjective:   [name] was seen today in follow up for essential tremor.  My previous records were reviewed prior to todays visit.  I last saw the patient in February, at which time we both agreed that it was not beneficial to start her on more medication for tremor.  She did not tolerate primidone.  She was already on metoprolol, along with a host of other antihypertensive medications.  Her tremor was really not really that bothersome.   She returns today stating that tremor has become more bothersome.  She asked last visit about morning headache, but I really felt that that was because of her blood pressure.  She was waking up with systolics in the 263Z.  She was in the emergency room recently on November 12 with back pain.  Patient does have a history of diffuse pain, chronic.  Emergency room indicates that they thought her back pain was muscular in nature.  Also noted that her blood pressure was again elevated.  She was given Robaxin in the emergency room and told to follow-up with her primary care physician.  Current prescribed movement disorder medications:   Prior medications: Primidone (did not tolerate)  ALLERGIES:   Allergies  Allergen Reactions   Ciprofloxacin Shortness Of Breath   Latex Other (See Comments)    Burns skin   Vitamin C [Bioflavonoid Products] Itching    CURRENT MEDICATIONS:  Outpatient Encounter Medications as of 04/27/2021  Medication Sig   acetaminophen (TYLENOL) 500 MG tablet Take 1,000 mg by mouth every 6 (six) hours as needed for moderate pain or headache.   ALPRAZolam (XANAX) 0.5 MG tablet TAKE 1 TABLET(0.5 MG) BY MOUTH TWICE DAILY AS NEEDED FOR ANXIETY   amLODipine (NORVASC) 10 MG tablet TAKE 1 TABLET(10 MG) BY MOUTH AT BEDTIME   aspirin EC 81 MG tablet Take 81 mg by mouth at bedtime.    Calcium 500 MG tablet Take 500 mg by mouth in the morning and at bedtime.  Carboxymethylcellul-Glycerin (CLEAR EYES FOR DRY EYES) 1-0.25 % SOLN Place 1 drop into both eyes daily as needed (dry eyes).   cholecalciferol (VITAMIN D3) 25 MCG (1000 UT) tablet Take 2,000 Units by mouth daily.   cloNIDine (CATAPRES) 0.1 MG tablet Take 0.1 mg by mouth daily as needed (High Blood Pressure).   Coenzyme Q10 (COQ10) 100 MG CAPS Take 100 mg by mouth daily.   CRANBERRY PO Take 1 each by mouth 2 (two) times a week. Chewables   Cyanocobalamin (B-12) 2500 MCG TABS Take 2,500 mcg by mouth daily.   desoximetasone  (TOPICORT) 0.25 % cream Apply 1 application topically daily as needed (irritation).   EPINEPHrine 0.3 mg/0.3 mL IJ SOAJ injection Inject 0.3 mg into the muscle as needed for anaphylaxis.    fluticasone 0.05%-ketoconazole 2% 1:1 cream mixture Apply 1 application topically daily as needed for irritation.   gentamicin ointment (GARAMYCIN) 0.1 % Apply 1 application topically daily as needed (nasal bleeds).   hydrochlorothiazide (HYDRODIURIL) 25 MG tablet TAKE 1 TABLET(25 MG) BY MOUTH DAILY (Patient taking differently: Take 12.5-25 mg by mouth daily as needed (Swelling/fluid).)   hydroquinone 4 % cream Apply 1 application topically 2 (two) times daily as needed (dark spots).   ipratropium (ATROVENT) 0.03 % nasal spray INSTILL 2 SPRAYS IN EACH NOSTRIL EVERY 12 HOURS   levocetirizine (XYZAL) 5 MG tablet Take 5 mg by mouth every evening.   linaclotide (LINZESS) 290 MCG CAPS capsule Take 1 capsule (290 mcg total) by mouth daily before breakfast.   loratadine (CLARITIN) 10 MG tablet Take 10 mg by mouth daily as needed for allergies.   MAGNESIUM PO Take 330 mg by mouth daily.   methocarbamol (ROBAXIN) 500 MG tablet Take 1 tablet (500 mg total) by mouth every 8 (eight) hours as needed for muscle spasms.   metoprolol succinate (TOPROL-XL) 100 MG 24 hr tablet Take 1 tablet (100 mg total) by mouth daily.   Omega-3 Fatty Acids (FISH OIL) 1000 MG CAPS Take 1,000 mg by mouth 3 (three) times a week.   ondansetron (ZOFRAN) 4 MG tablet TAKE 1 TABLET(4 MG) BY MOUTH EVERY 8 HOURS AS NEEDED FOR NAUSEA OR VOMITING   pantoprazole (PROTONIX) 40 MG tablet Take 40 mg by mouth 2 (two) times daily before a meal.   polyethylene glycol-electrolytes (NULYTELY) 420 g solution As directed   potassium chloride SA (KLOR-CON) 20 MEQ tablet TAKE 1 TABLET(20 MEQ) BY MOUTH DAILY   rosuvastatin (CRESTOR) 5 MG tablet TAKE 1 TABLET(5 MG) BY MOUTH DAILY   sodium chloride (OCEAN) 0.65 % SOLN nasal spray Place 1 spray into both nostrils as  needed for congestion.   triamcinolone (NASACORT) 55 MCG/ACT AERO nasal inhaler Place 1 spray into the nose daily as needed (allergies).   valsartan (DIOVAN) 320 MG tablet TAKE 1 TABLET BY MOUTH EVERY DAY   vitamin E 400 UNIT capsule Take 400 Units by mouth 3 (three) times a week.   zinc gluconate 50 MG tablet Take 50 mg by mouth daily.   No facility-administered encounter medications on file as of 04/27/2021.     Objective:    PHYSICAL EXAMINATION:    VITALS:   There were no vitals filed for this visit.   GEN:  The patient appears stated age and is in NAD. HEENT:  Normocephalic, atraumatic.  The mucous membranes are moist. The superficial temporal arteries are without ropiness or tenderness. CV:  RRR Lungs:  CTAB Neck/HEME:  There are no carotid bruits bilaterally.  Neurological examination:  Orientation:  The patient is alert and oriented x3. Cranial nerves: There is good facial symmetry. The speech is fluent and clear. Soft palate rises symmetrically and there is no tongue deviation. Hearing is intact to conversational tone. Sensation: Sensation is intact to light touch throughout Motor: Strength is at least antigravity x4.  Movement examination: Tone: There is normal tone in the UE/LE Abnormal movements: There is no rest tremor. There is mild postural tremor. There is mild to moderate intention tremor on the right greater than left. Coordination:  There is no decremation with RAM's  I have reviewed and interpreted the following labs independently   Chemistry      Component Value Date/Time   NA 138 03/19/2021 0926   NA 141 11/16/2012 0000   K 3.7 03/19/2021 0926   K 4.1 11/16/2012 0000   CL 103 03/19/2021 0926   CO2 29 03/19/2021 0926   BUN 9 03/19/2021 0926   BUN 11 11/16/2012 0000   CREATININE 0.84 03/19/2021 0926      Component Value Date/Time   CALCIUM 9.3 03/19/2021 0926   CALCIUM 9.7 11/16/2012 0000   ALKPHOS 87 03/02/2019 1257   ALKPHOS 77 11/16/2012  0000   AST 13 03/19/2021 0926   AST 18 11/16/2012 0000   ALT 12 03/19/2021 0926   BILITOT 0.7 03/19/2021 0926   BILITOT 0.8 11/16/2012 0000      Lab Results  Component Value Date   WBC 6.7 03/19/2021   HGB 12.8 03/19/2021   HCT 39.0 03/19/2021   MCV 90.3 03/19/2021   PLT 261 03/19/2021   Lab Results  Component Value Date   TSH 1.84 10/29/2019     Chemistry      Component Value Date/Time   NA 138 03/19/2021 0926   NA 141 11/16/2012 0000   K 3.7 03/19/2021 0926   K 4.1 11/16/2012 0000   CL 103 03/19/2021 0926   CO2 29 03/19/2021 0926   BUN 9 03/19/2021 0926   BUN 11 11/16/2012 0000   CREATININE 0.84 03/19/2021 0926      Component Value Date/Time   CALCIUM 9.3 03/19/2021 0926   CALCIUM 9.7 11/16/2012 0000   ALKPHOS 87 03/02/2019 1257   ALKPHOS 77 11/16/2012 0000   AST 13 03/19/2021 0926   AST 18 11/16/2012 0000   ALT 12 03/19/2021 0926   BILITOT 0.7 03/19/2021 0926   BILITOT 0.8 11/16/2012 0000      Lab Results  Component Value Date   ESRSEDRATE 14 06/10/2020   Lab Results  Component Value Date   CKTOTAL 78 06/10/2020   TROPONINI <0.03 12/28/2017      Total time spent on today's visit was *** minutes, including both face-to-face time and nonface-to-face time.  Time included that spent on review of records (prior notes available to me/labs/imaging if pertinent), discussing treatment and goals, answering patient's questions and coordinating care.  Cc:  Susy Frizzle, MD

## 2021-04-21 NOTE — Telephone Encounter (Signed)
Please call patient.  Noting that she has a follow-up appointment on Monday.  Want to make sure that we can be a resource for her.  When I saw her last in February, she came with morning headache, and she was sent from the emergency room, but the issue was that she was having headaches from her blood pressure and her primary care was already trying to lower her blood pressure.  She was very frustrated that we would not treat her blood pressure/morning headache.  This time, I see that she also just had a emergency room visit, this time for back pain.  They did tell her to follow-up with her primary care physician, but I saw that she made an appointment here.  If her appointment on Monday is for back pain, I need her to know that we do not treat pain here.  She does have a history of chronic pain.  I do not want her to get here and find that she is frustrated again.  Please remind her that I am a movement disorder physician, and she was originally sent here for tremor.

## 2021-04-21 NOTE — Telephone Encounter (Signed)
Called patient and patient stated that she is coming in to see Dr. Carles Collet for tremors in her hands. Patient is aware that Dr. Carles Collet does not treat pain and is a movement disorder physician and we were just making sure that patient is following up with the correct physician. Patient verbalized understanding.

## 2021-04-22 ENCOUNTER — Other Ambulatory Visit: Payer: Self-pay | Admitting: Family Medicine

## 2021-04-27 ENCOUNTER — Ambulatory Visit: Payer: Medicare Other | Admitting: Neurology

## 2021-05-08 ENCOUNTER — Other Ambulatory Visit: Payer: Self-pay | Admitting: Family Medicine

## 2021-05-08 DIAGNOSIS — I10 Essential (primary) hypertension: Secondary | ICD-10-CM

## 2021-05-14 DIAGNOSIS — H43812 Vitreous degeneration, left eye: Secondary | ICD-10-CM | POA: Diagnosis not present

## 2021-05-18 ENCOUNTER — Other Ambulatory Visit: Payer: Self-pay

## 2021-05-18 ENCOUNTER — Ambulatory Visit (INDEPENDENT_AMBULATORY_CARE_PROVIDER_SITE_OTHER): Payer: Medicare Other | Admitting: Family Medicine

## 2021-05-18 ENCOUNTER — Encounter: Payer: Self-pay | Admitting: Family Medicine

## 2021-05-18 VITALS — BP 162/78 | HR 89 | Temp 97.0°F | Resp 18 | Ht 66.0 in | Wt 171.0 lb

## 2021-05-18 DIAGNOSIS — R3989 Other symptoms and signs involving the genitourinary system: Secondary | ICD-10-CM

## 2021-05-18 DIAGNOSIS — I10 Essential (primary) hypertension: Secondary | ICD-10-CM

## 2021-05-18 MED ORDER — TRIAMTERENE-HCTZ 75-50 MG PO TABS: 1.0000 | ORAL_TABLET | Freq: Every day | ORAL | 3 refills | Status: DC

## 2021-05-18 NOTE — Progress Notes (Signed)
Subjective:    Patient ID: [name], female    DOB: 27-Feb-1956, 66 y.o.   MRN: [id number]  Patient presents today with pain in her lower back.  She states that she does fine throughout the day but whenever she lays down at night she will feel a twinge of pain in her lower back roughly around the level of L5.  The pain is located laterally to the spinous processes.  Is more in the muscles.  It hurts when she gets out of bed in the morning.  Is a sharp pain whenever she rolls to get out of bed.  After walking for a little while, her back limbers up and then the pain is better.  Sounds more muscular.  She is not doing any stretches or physical therapy for this.  During the day, while she is up and moving, she has very little pain.  She is also noticed a green tint to her urine.  She denies any frequency urgency hesitancy or dysuria.  Her blood pressure is also been elevated.  She has not been taking hydrochlorothiazide because medication gives her cramps in her hands. Past Medical History:  Diagnosis Date   Abdominal aortic aneurysm (AAA) 3.0 cm to 5.0 cm in diameter in female    None seen on prior imaging?    Anxiety    Cataract    Chronic left shoulder pain    Headache    Hiatal hernia    small   Hyperlipidemia    Hypertension    Pre-diabetes    Schatzki's ring    Seasonal allergies    Tremor    Past Surgical History:  Procedure Laterality Date   CARDIAC CATHETERIZATION  2005   "Ms. Trefry has essentially normal coronary arteries and normal left ventricular function.  She will be treated empirically with anti-reflux measures"   CATARACT EXTRACTION, BILATERAL     CHOLECYSTECTOMY     COLONOSCOPY    06/16/2004   [id number] rectum/Diminutive polyps of the splenic flexure and the cecum/The remainder of the colonic mucosa appeared normal pathology (hyperplastic polyps).   COLONOSCOPY N/A 08/07/2015   Dr. Gala Romney: 6 mm descending colon tubular adenoma, multiple medium-mouthed  diverticula in sigmoid colon. surveillance 2022   COLONOSCOPY N/A 04/15/2021   Procedure: COLONOSCOPY;  Surgeon: Daneil Dolin, MD;  [facility]: [facility];  Service: Endoscopy;  Laterality: N/A;  9:30am   ESOPHAGOGASTRODUODENOSCOPY  09/11/2001   YPP:[id number] ring otherwise normal esophagus/Normal stomach/Normal D1 and D2 status post passage of a 56 French Maloney dilator   ESOPHAGOGASTRODUODENOSCOPY N/A 11/11/2017   Procedure: ESOPHAGOGASTRODUODENOSCOPY (EGD);  Surgeon: Daneil Dolin, MD; mild Schatzki ring s/p dilation and mild erosive gastropathy.   ESOPHAGOGASTRODUODENOSCOPY (EGD) WITH ESOPHAGEAL DILATION N/A 01/25/2013   Dr. Gala Romney- schatzki's ring- 25 F dilation. small hiatal hernia   HYSTEROSCOPY WITH D & C N/A 03/06/2019   Procedure: DILATATION AND CURETTAGE /HYSTEROSCOPY;  Surgeon: Jonnie Kind, MD;  Location: [facility];  Service: Gynecology;  Laterality: N/A;   MALONEY DILATION N/A 11/11/2017   Procedure: [name] DILATION;  Surgeon: Daneil Dolin, MD;  Location: [facility];  Service: Endoscopy;  Laterality: N/A;   POLYPECTOMY N/A 03/06/2019   Procedure: POLYPECTOMY ( REMOVAL OF ENDOMETRIAL POLYP);  Surgeon: Jonnie Kind, MD;  Location: [facility];  Service: Gynecology;  Laterality: N/A;   POLYPECTOMY  04/15/2021   Procedure: POLYPECTOMY;  Surgeon: Daneil Dolin, MD;  Location: [facility];  Service: Endoscopy;;   TUBAL LIGATION  Current Outpatient Medications on File Prior to Visit  Medication Sig Dispense Refill   acetaminophen (TYLENOL) 500 MG tablet Take 1,000 mg by mouth every 6 (six) hours as needed for moderate pain or headache.     ALPRAZolam (XANAX) 0.5 MG tablet TAKE 1 TABLET(0.5 MG) BY MOUTH TWICE DAILY AS NEEDED FOR ANXIETY 60 tablet 0   amLODipine (NORVASC) 10 MG tablet TAKE 1 TABLET(10 MG) BY MOUTH AT BEDTIME 90 tablet 3   aspirin EC 81 MG tablet Take 81 mg by mouth at bedtime.      Calcium 500 MG tablet Take 500 mg by mouth in the morning and at bedtime.      Carboxymethylcellul-Glycerin (CLEAR EYES FOR DRY EYES) 1-0.25 % SOLN Place 1 drop into both eyes daily as needed (dry eyes).     cholecalciferol (VITAMIN D3) 25 MCG (1000 UT) tablet Take 2,000 Units by mouth daily.     cloNIDine (CATAPRES) 0.1 MG tablet Take 0.1 mg by mouth daily as needed (High Blood Pressure).     Coenzyme Q10 (COQ10) 100 MG CAPS Take 100 mg by mouth daily.     CRANBERRY PO Take 1 each by mouth 2 (two) times a week. Chewables     Cyanocobalamin (B-12) 2500 MCG TABS Take 2,500 mcg by mouth daily.     desoximetasone (TOPICORT) 0.25 % cream Apply 1 application topically daily as needed (irritation).     EPINEPHrine 0.3 mg/0.3 mL IJ SOAJ injection Inject 0.3 mg into the muscle as needed for anaphylaxis.      fluticasone 0.05%-ketoconazole 2% 1:1 cream mixture Apply 1 application topically daily as needed for irritation.     gentamicin ointment (GARAMYCIN) 0.1 % Apply 1 application topically daily as needed (nasal bleeds).     hydrochlorothiazide (HYDRODIURIL) 25 MG tablet TAKE 1 TABLET(25 MG) BY MOUTH DAILY (Patient taking differently: Take 12.5-25 mg by mouth daily as needed (Swelling/fluid).) 30 tablet 11   hydroquinone 4 % cream Apply 1 application topically 2 (two) times daily as needed (dark spots).     ipratropium (ATROVENT) 0.03 % nasal spray INSTILL 2 SPRAYS IN EACH NOSTRIL EVERY 12 HOURS 30 mL 12   levocetirizine (XYZAL) 5 MG tablet Take 5 mg by mouth every evening.     linaclotide (LINZESS) 290 MCG CAPS capsule Take 1 capsule (290 mcg total) by mouth daily before breakfast. 90 capsule 3   loratadine (CLARITIN) 10 MG tablet Take 10 mg by mouth daily as needed for allergies.     MAGNESIUM PO Take 330 mg by mouth daily.     metoprolol succinate (TOPROL-XL) 100 MG 24 hr tablet TAKE 1 TABLET(100 MG) BY MOUTH DAILY 90 tablet 3   Omega-3 Fatty Acids (FISH OIL) 1000 MG CAPS Take 1,000 mg by mouth 3 (three) times a week.     ondansetron (ZOFRAN) 4 MG tablet TAKE 1 TABLET(4 MG)  BY MOUTH EVERY 8 HOURS AS NEEDED FOR NAUSEA OR VOMITING 30 tablet 0   pantoprazole (PROTONIX) 40 MG tablet Take 40 mg by mouth 2 (two) times daily before a meal.     polyethylene glycol-electrolytes (NULYTELY) 420 g solution As directed 4000 mL 0   potassium chloride SA (KLOR-CON) 20 MEQ tablet TAKE 1 TABLET(20 MEQ) BY MOUTH DAILY 30 tablet 5   rosuvastatin (CRESTOR) 5 MG tablet TAKE 1 TABLET(5 MG) BY MOUTH DAILY 90 tablet 3   sodium chloride (OCEAN) 0.65 % SOLN nasal spray Place 1 spray into both nostrils as needed for congestion.  triamcinolone (NASACORT) 55 MCG/ACT AERO nasal inhaler Place 1 spray into the nose daily as needed (allergies).     valsartan (DIOVAN) 320 MG tablet TAKE 1 TABLET BY MOUTH EVERY DAY 90 tablet 1   vitamin E 400 UNIT capsule Take 400 Units by mouth 3 (three) times a week.     zinc gluconate 50 MG tablet Take 50 mg by mouth daily.     No current facility-administered medications on file prior to visit.   Allergies  Allergen Reactions   Ciprofloxacin Shortness Of Breath   Latex Other (See Comments)    Burns skin   Vitamin C [Bioflavonoid Products] Itching   Social History   Socioeconomic History   Marital status: Single    Spouse name: Not on file   Number of children: 3   Years of education: Not on file   Highest education level: Not on file  Occupational History   Occupation: English as a second language teacher: [employer]  Tobacco Use   Smoking status: Former    Packs/day: 0.25    Years: 35.00    Pack years: 8.75    Types: Cigarettes    Quit date: 12/31/2012    Years since quitting: 8.3   Smokeless tobacco: Never  Vaping Use   Vaping Use: Never used  Substance and Sexual Activity   Alcohol use: Not Currently   Drug use: No   Sexual activity: Not Currently    Partners: Male    Birth control/protection: Post-menopausal, Surgical    Comment: tubal  Other Topics Concern   Not on file  Social History Narrative   Eats all food groups.    Wears  seatbelt.    Has 3 children.   Prior smoker.   Attends church.    Used to work in [employer].    Drives.   Enjoys going out with family.    Social Determinants of Health   Financial Resource Strain: Not on file  Food Insecurity: Not on file  Transportation Needs: Not on file  Physical Activity: Not on file  Stress: Not on file  Social Connections: Not on file  Intimate Partner Violence: Not on file   Family History  Problem Relation Age of Onset   Diabetes Other    Diabetes Mother    Heart disease Mother    Heart disease Father    Diabetes Sister    Diabetes Brother    Hypertension Brother    Diabetes Daughter    Hypertension Maternal Grandmother    Stroke Maternal Grandfather    Early death Paternal Grandfather    Colon cancer Neg Hx    Liver disease Neg Hx       Review of Systems  All other systems reviewed and are negative.     Objective:   Physical Exam Vitals reviewed.  Constitutional:      Appearance: She is well-developed.  Eyes:     General: No visual field deficit.    Extraocular Movements:     Right eye: Normal extraocular motion and no nystagmus.     Left eye: Normal extraocular motion and no nystagmus.     Pupils:     Right eye: Pupil is round and reactive.     Left eye: Pupil is round and reactive.  Cardiovascular:     Rate and Rhythm: Normal rate and regular rhythm.     Heart sounds: Normal heart sounds. No murmur heard.   No friction rub. No gallop.  Pulmonary:  Effort: Pulmonary effort is normal. No respiratory distress.     Breath sounds: Normal breath sounds. No stridor. No wheezing or rales.  Abdominal:     General: Abdomen is flat. Bowel sounds are normal. There is no distension.     Palpations: Abdomen is soft.     Tenderness: There is no abdominal tenderness. There is no guarding.  Musculoskeletal:     Right hand: Normal. No swelling, deformity, tenderness or bony tenderness. Normal range of motion.     Left hand: Normal. No  swelling, deformity, tenderness or bony tenderness. Normal range of motion.     Cervical back: Normal range of motion. No rigidity.  Neurological:     Mental Status: She is alert and oriented to person, place, and time. Mental status is at baseline.     Cranial Nerves: No cranial nerve deficit, dysarthria or facial asymmetry.     Sensory: No sensory deficit.     Motor: Tremor present. No weakness or abnormal muscle tone.     Coordination: Romberg sign negative. Coordination normal.     Gait: Gait normal.          Assessment & Plan:  Abnormal urine color - Plan: Urinalysis, Routine w reflex microscopic, COMPLETE METABOLIC PANEL WITH GFR  Essential hypertension I will check a urinalysis to rule out any infection or blood or bilirubin in the urine.  I suspect it may be due to a dietary supplement.  I believe the low back pain is likely muscular.  I recommended trying stretches or yoga or Pilates to help address.  Her blood pressure is elevated.  We will discontinue hydrochlorothiazide and start the patient on Maxide 37.5/25 1 p.o. daily and recheck blood pressure and potassium in 2 weeks.  She will return for a BMP in 2 weeks.

## 2021-05-19 DIAGNOSIS — J343 Hypertrophy of nasal turbinates: Secondary | ICD-10-CM | POA: Diagnosis not present

## 2021-05-19 DIAGNOSIS — J3089 Other allergic rhinitis: Secondary | ICD-10-CM | POA: Diagnosis not present

## 2021-05-19 DIAGNOSIS — H93A2 Pulsatile tinnitus, left ear: Secondary | ICD-10-CM | POA: Insufficient documentation

## 2021-05-19 DIAGNOSIS — H6121 Impacted cerumen, right ear: Secondary | ICD-10-CM | POA: Diagnosis not present

## 2021-05-19 LAB — URINALYSIS, ROUTINE W REFLEX MICROSCOPIC
Bilirubin Urine: NEGATIVE
Glucose, UA: NEGATIVE
Hgb urine dipstick: NEGATIVE
Ketones, ur: NEGATIVE
Leukocytes,Ua: NEGATIVE
Nitrite: NEGATIVE
Protein, ur: NEGATIVE
Specific Gravity, Urine: 1.003 (ref 1.001–1.035)
pH: 6.5 (ref 5.0–8.0)

## 2021-05-19 LAB — COMPLETE METABOLIC PANEL WITH GFR
AG Ratio: 1.6 (calc) (ref 1.0–2.5)
ALT: 14 U/L (ref 6–29)
AST: 18 U/L (ref 10–35)
Albumin: 4.3 g/dL (ref 3.6–5.1)
Alkaline phosphatase (APISO): 88 U/L (ref 37–153)
BUN: 15 mg/dL (ref 7–25)
CO2: 27 mmol/L (ref 20–32)
Calcium: 9.8 mg/dL (ref 8.6–10.4)
Chloride: 105 mmol/L (ref 98–110)
Creat: 0.82 mg/dL (ref 0.50–1.05)
Globulin: 2.7 g/dL (calc) (ref 1.9–3.7)
Glucose, Bld: 77 mg/dL (ref 65–99)
Potassium: 4.4 mmol/L (ref 3.5–5.3)
Sodium: 140 mmol/L (ref 135–146)
Total Bilirubin: 0.8 mg/dL (ref 0.2–1.2)
Total Protein: 7 g/dL (ref 6.1–8.1)
eGFR: 78 mL/min/{1.73_m2} (ref >=60)

## 2021-05-25 IMAGING — DX DG CHEST 2V
2 series · 2 of 2 positions shown · non-contrast
Comparison: 12/28/2017

CLINICAL DATA: Hypertension

EXAM:
CHEST - 2 VIEW

[chest lat]
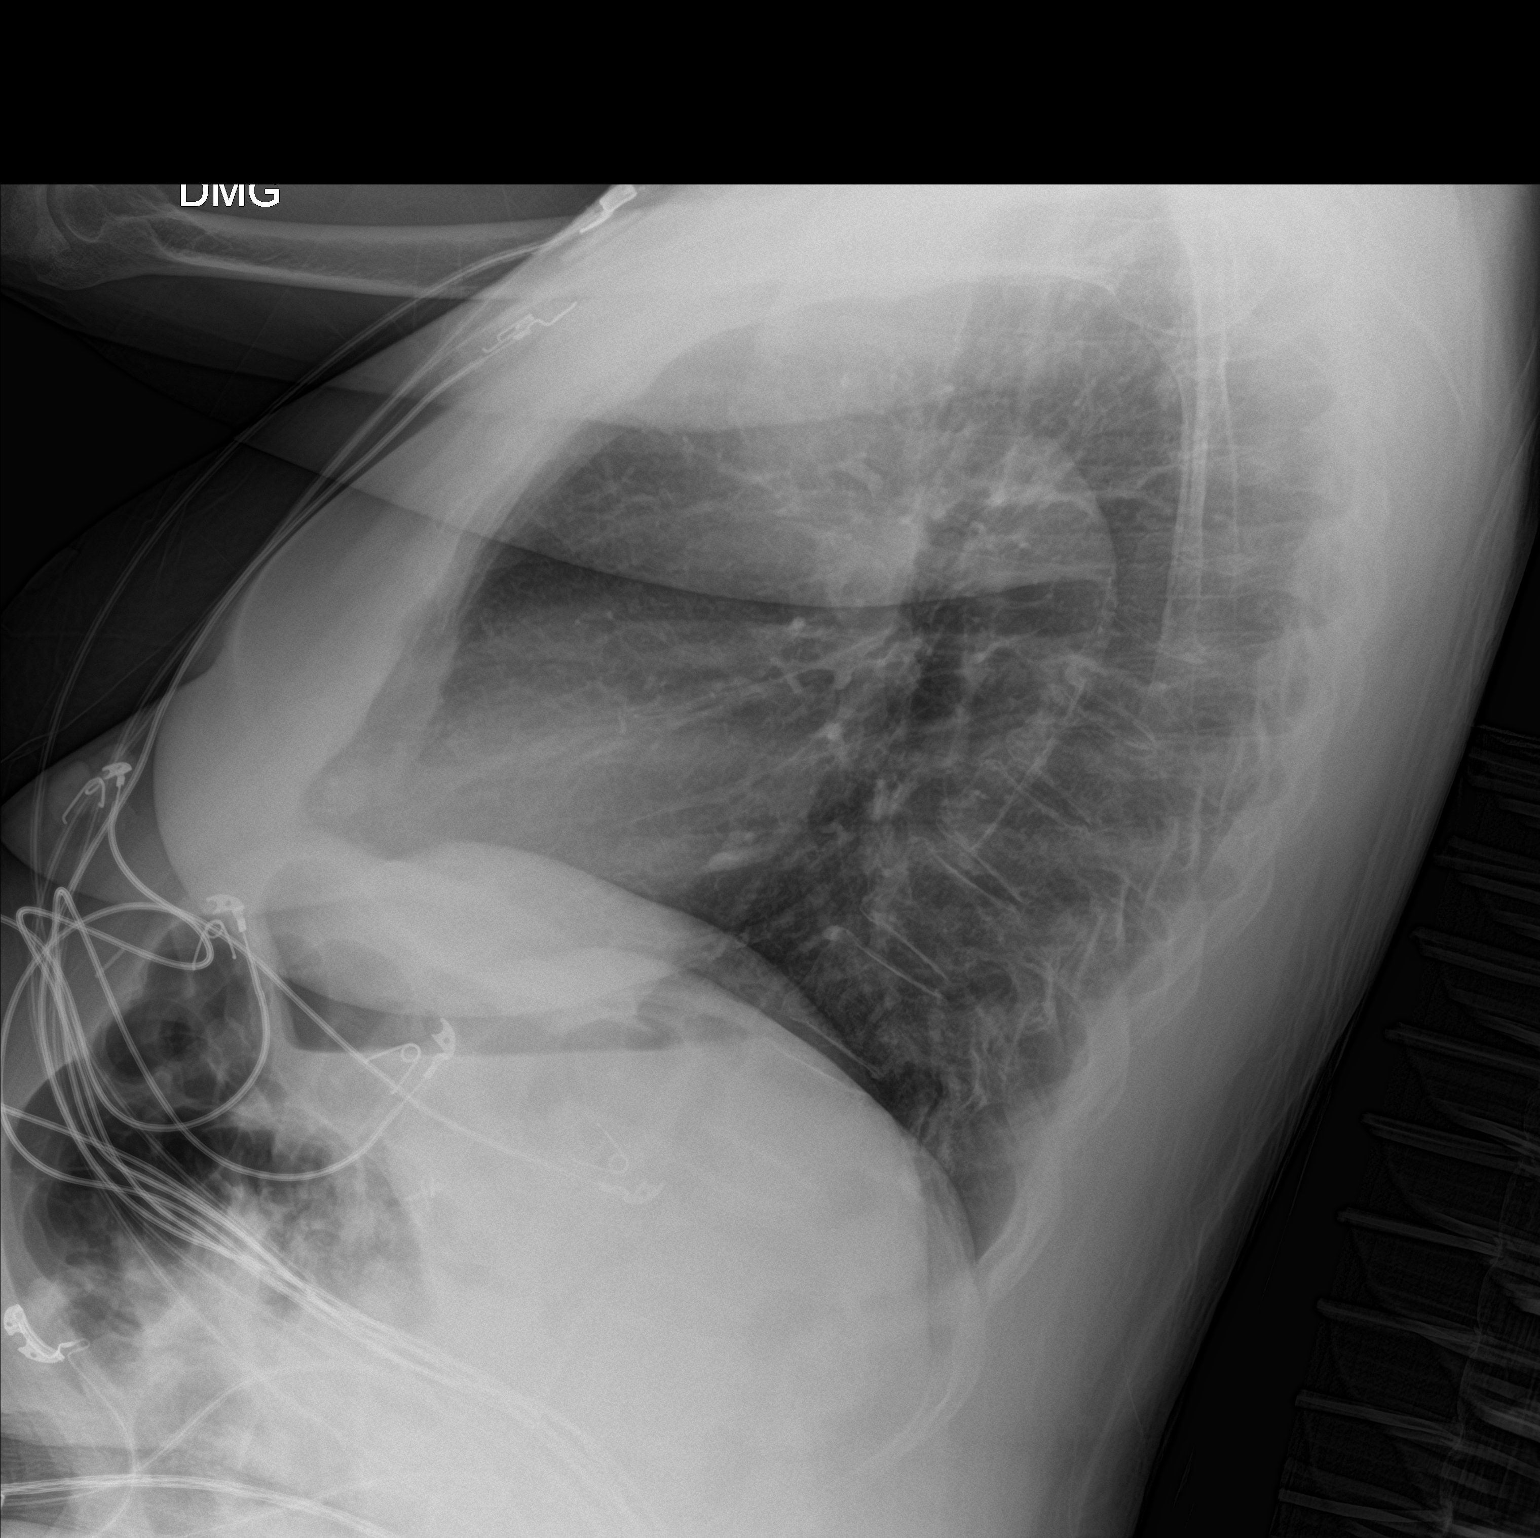

[chest ap]
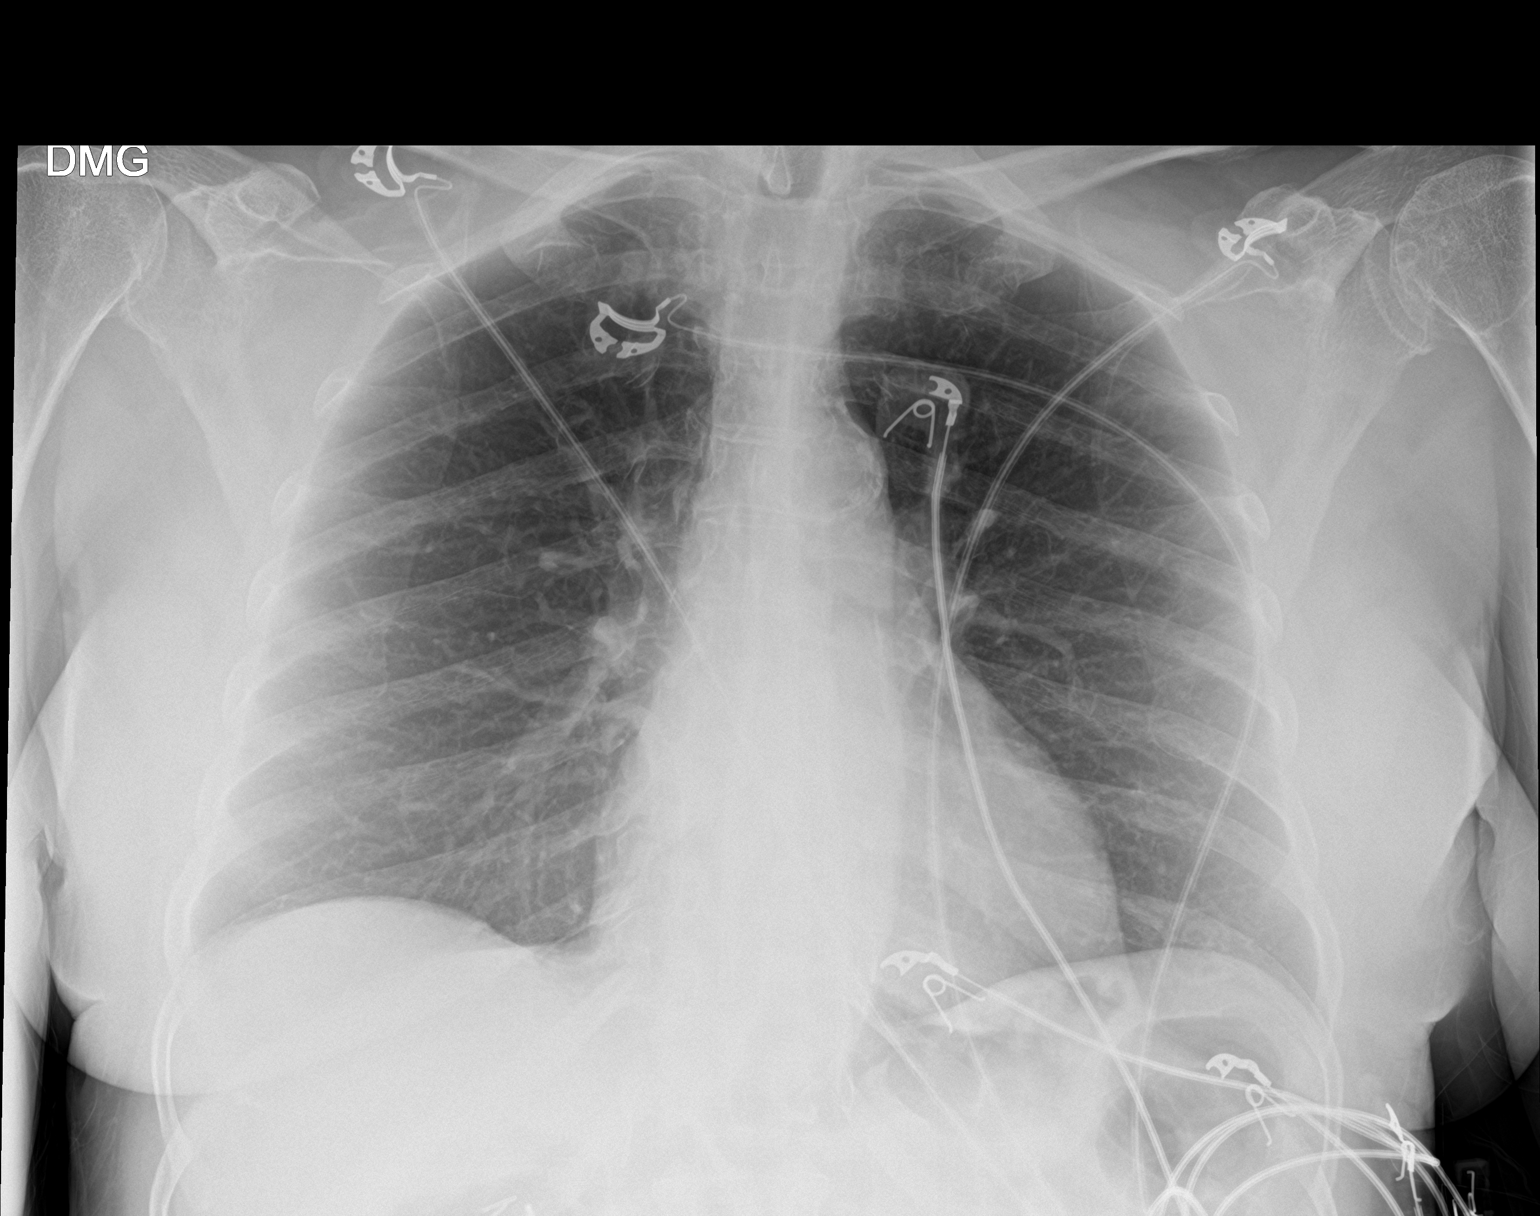

[2 of 2 positions shown; findings below may reference images not displayed]

FINDINGS: Heart is normal size. Aortic atherosclerosis. Lungs clear. No
effusions. No acute bony abnormality.
IMPRESSION: No active cardiopulmonary disease.

## 2021-05-25 IMAGING — CT CT HEAD W/O CM
3 series · 16 of 47 positions shown, 19 images · non-contrast
Comparison: CT head dated December 29, 2017.

CLINICAL DATA: Headache. Intermittent hypertension with systolic
pressures in the 200s.

EXAM:
CT HEAD WITHOUT CONTRAST
TECHNIQUE: Contiguous axial images were obtained from the base of the skull
through the vertex without intravenous contrast.

[Series 2: head w o · axial · 0.46mm/px · z∈[+1178,+1313]mm · 10 of 33 slices shown, 13 images]
[im 3/33  brain]
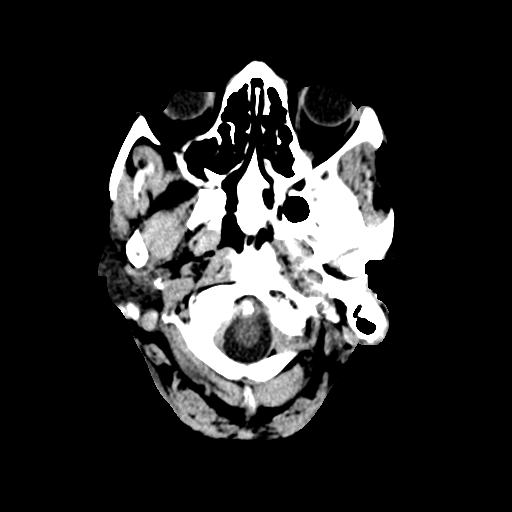
[im 3/33  bone]
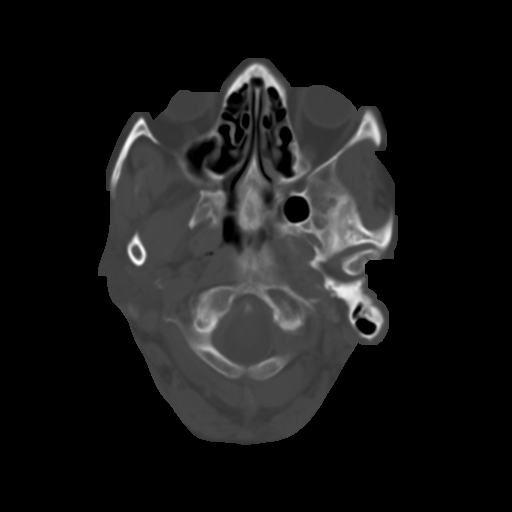
[im 6/33  brain]
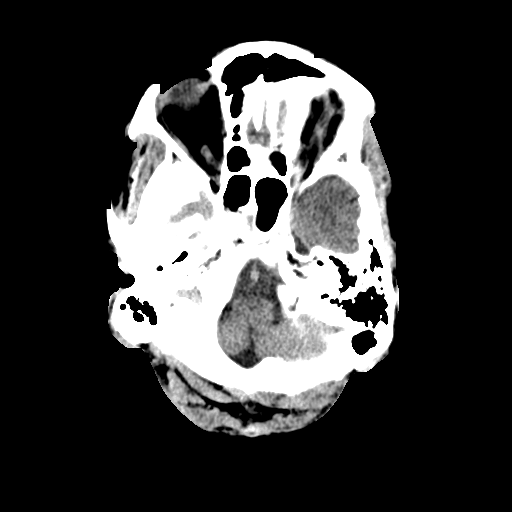
[im 9/33  brain]
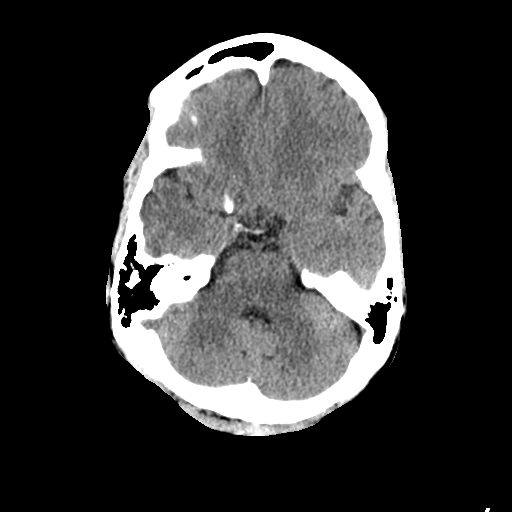
[im 12/33  brain]
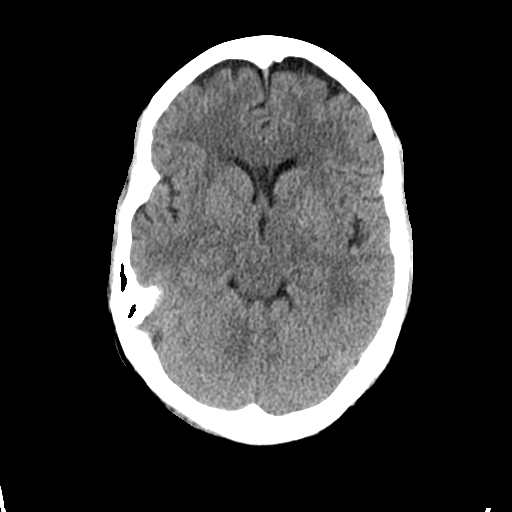
[im 15/33  brain]
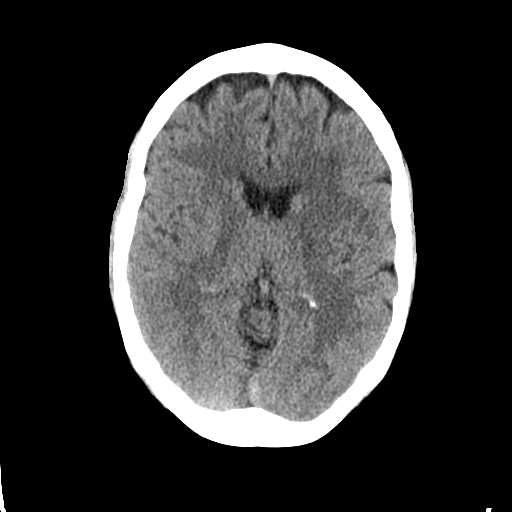
[im 15/33  bone]
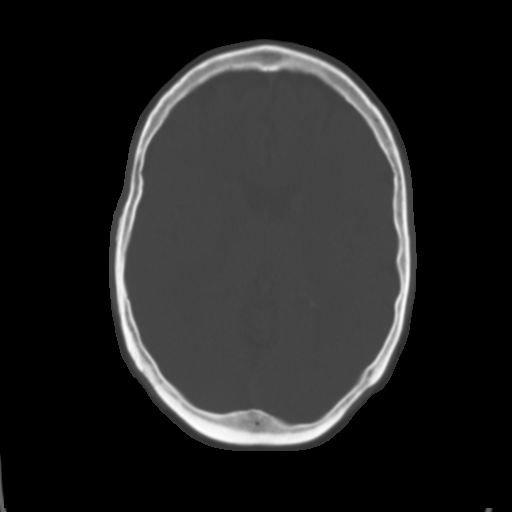
[im 18/33  brain]
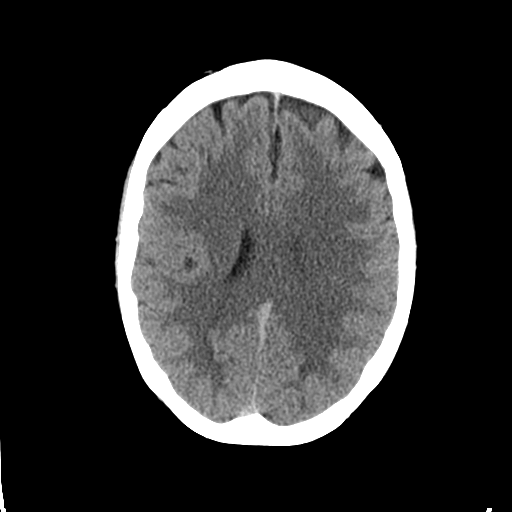
[im 21/33  brain]
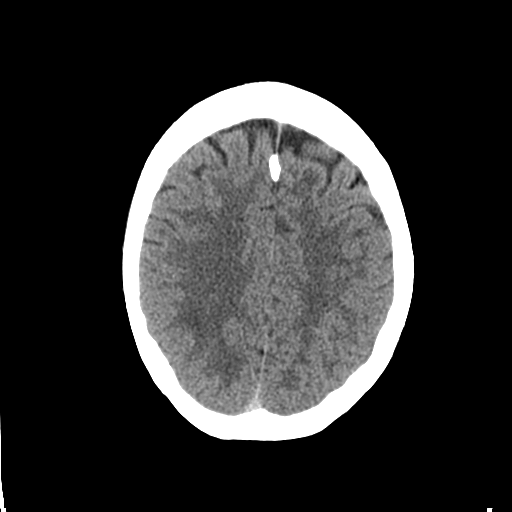
[im 25/33  brain]
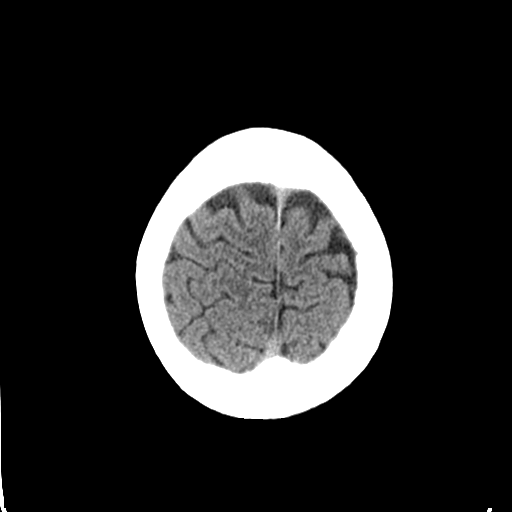
[im 27/33  brain]
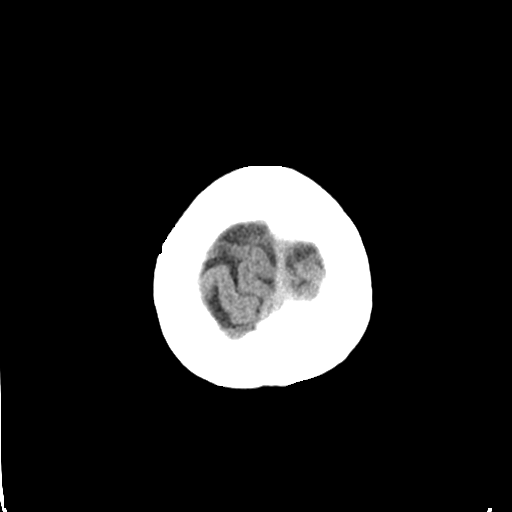
[im 27/33  bone]
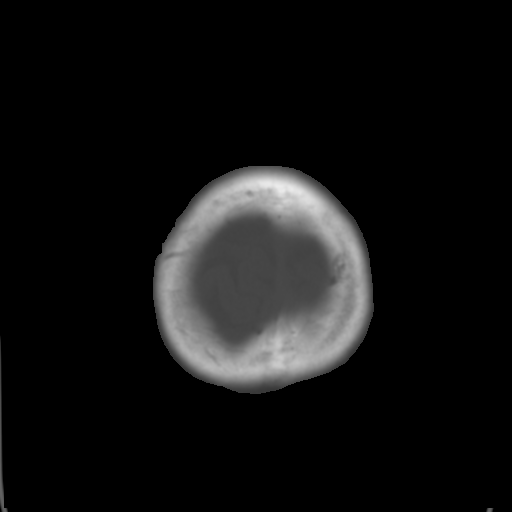
[im 30/33  brain]
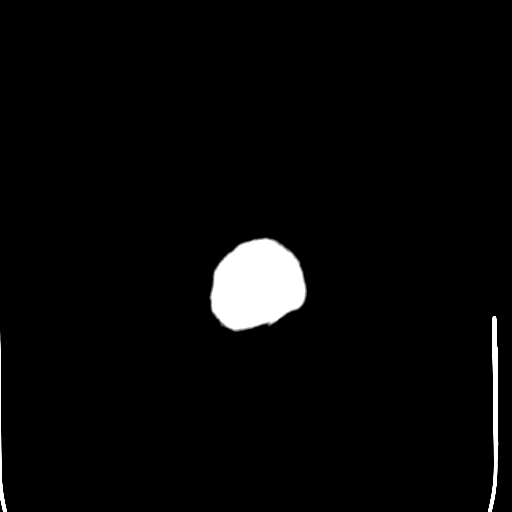

[Series 4: coronal soft · coronal · 0.33mm/px · 3 of 70 slices shown]
[im 24/70  brain]
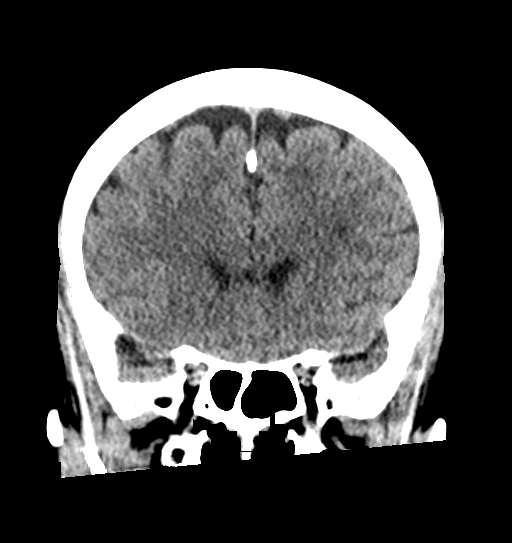
[im 31/70  brain]
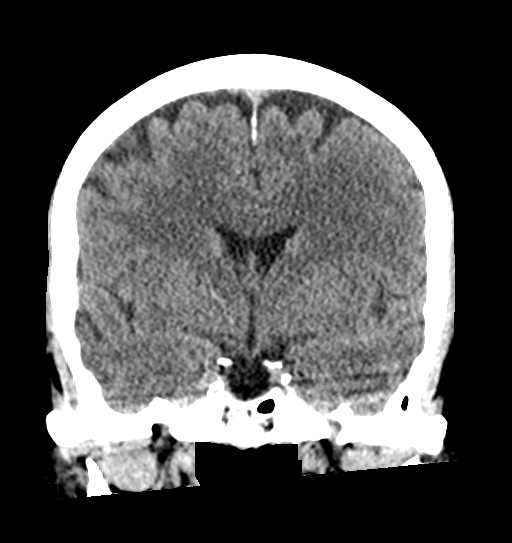
[im 39/70  brain]
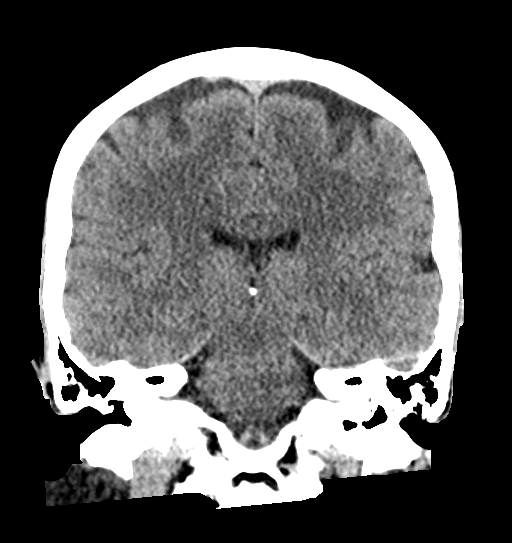

[Series 5: sagittal soft · sagittal · 0.34mm/px · 3 of 56 slices shown]
[im 19/56  brain]
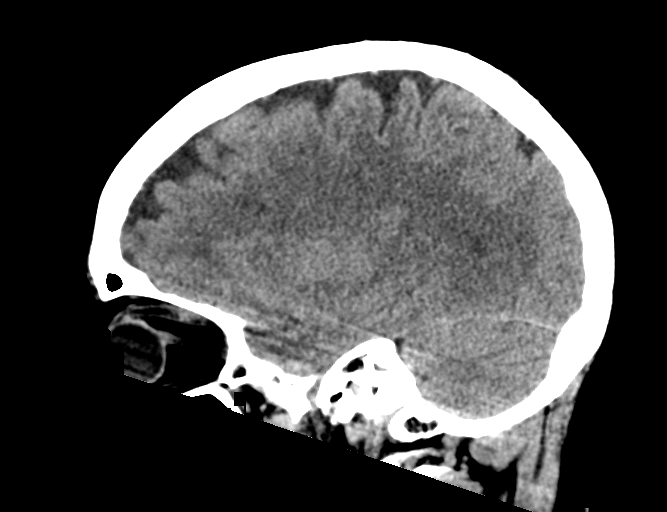
[im 28/56  brain]
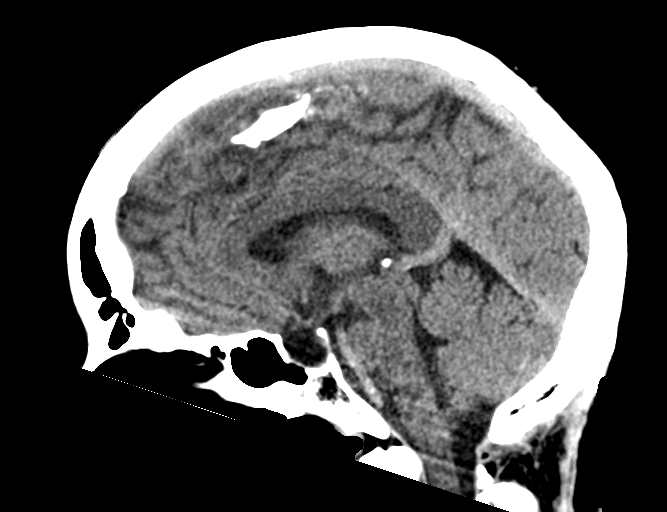
[im 37/56  brain]
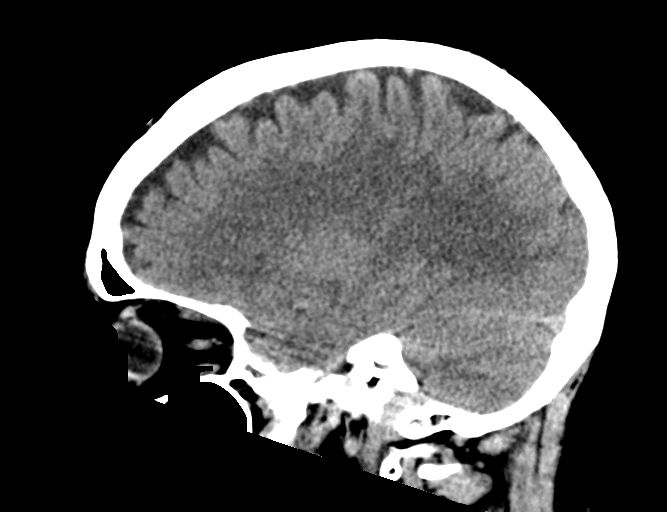

[16 of 47 positions shown; findings below may reference images not displayed]

FINDINGS: Brain: No evidence of acute infarction, hemorrhage, hydrocephalus,
extra-axial collection or mass lesion/mass effect. Unchanged empty
sella.

Vascular: Atherosclerotic vascular calcification of the carotid
siphons. No hyperdense vessel.

Skull: Normal. Negative for fracture or focal lesion.

Sinuses/Orbits: No acute finding.

Other: None.
IMPRESSION: 1.  No acute intracranial abnormality.

## 2021-05-27 ENCOUNTER — Telehealth: Payer: Self-pay | Admitting: Pharmacist

## 2021-05-27 NOTE — Progress Notes (Signed)
Chronic Care Management Pharmacy Assistant   Name: [name]  MRN: [id number] DOB: Dec 17, 1955   Reason for Encounter: Disease State - Diabetes Call     Recent office visits:  05/18/21 [name], MD - Family Medicine - Abnormal urine color -  Labs were ordered. triamterene-hydrochlorothiazide (MAXZIDE) 75-50 MG tablet prescribed. Disconitnued HCTZ. Follow up in 2 weeks for BP check and BMP.  03/19/21 [name], MD - Family Medicine - Chest wall Pain - Labs were ordered. Chest xray ordered. I believe this is musculoskeletal chest wall pain.  Obtain an x-ray to rule out any underlying pulmonary pathology.  Palpation does not elicit pain today on exam so I do not feel that she is cracked a rib.  There is no relationship of the pain to food so I do not feel that this is an ulcer.  Her gallbladder has been removed.  I will obtain some baseline labs as well as a chest x-ray and then monitor the pain for the next 2 to 3 weeks.  If the pain goes away no further work-up is necessary assuming that chest x-ray and the labs are normal.   Recent consult visits:  03/11/21 [name], PA-C - Gastroenterology - History of Colonic polyps - Continue pantoprazole 40 mg once to twice daily, use lowest effective dose. Continue Linzess 290 mcg daily for constipation. Colonoscopy to be scheduled.   Hospital visits: 04/15/21 Medication Reconciliation was completed by comparing discharge summary, patients EMR and Pharmacy list, and upon discussion with patient.  Admitted to the hospital on 04/15/21 due to Colonoscopy. Discharge date was 04/15/21. Discharged from [facility]?Medications Started at [facility] Discharge:?? None noted.  Medication Changes at Hospital Discharge: None noted.   Medications Discontinued at Hospital Discharge: None noted.  Medications that remain the same after Hospital Discharge:??  All other medications will remain the same.    Hospital visits:  03/21/21 Medication Reconciliation was completed by comparing discharge summary, patients EMR and Pharmacy list, and upon discussion with patient.  Admitted to the hospital on 03/21/21 due to Strain of Lumbar region. Discharge date was 03/21/21. Discharged from [place]?Medications Started at [facility] Discharge:?? methocarbamol (ROBAXIN) 500 MG tablet  Medication Changes at Hospital Discharge: None noted.   Medications Discontinued at Hospital Discharge: None noted.   Medications that remain the same after Hospital Discharge:??  All other medications will remain the same.   Medications: Outpatient Encounter Medications as of 05/27/2021  Medication Sig   acetaminophen (TYLENOL) 500 MG tablet Take 1,000 mg by mouth every 6 (six) hours as needed for moderate pain or headache.   ALPRAZolam (XANAX) 0.5 MG tablet TAKE 1 TABLET(0.5 MG) BY MOUTH TWICE DAILY AS NEEDED FOR ANXIETY   amLODipine (NORVASC) 10 MG tablet TAKE 1 TABLET(10 MG) BY MOUTH AT BEDTIME   aspirin EC 81 MG tablet Take 81 mg by mouth at bedtime.    Calcium 500 MG tablet Take 500 mg by mouth in the morning and at bedtime.   Carboxymethylcellul-Glycerin (CLEAR EYES FOR DRY EYES) 1-0.25 % SOLN Place 1 drop into both eyes daily as needed (dry eyes).   cholecalciferol (VITAMIN D3) 25 MCG (1000 UT) tablet Take 2,000 Units by mouth daily.   cloNIDine (CATAPRES) 0.1 MG tablet Take 0.1 mg by mouth daily as needed (High Blood Pressure).   Coenzyme Q10 (COQ10) 100 MG CAPS Take 100 mg by mouth daily.   CRANBERRY PO Take 1 each by  mouth 2 (two) times a week. Chewables   Cyanocobalamin (B-12) 2500 MCG TABS Take 2,500 mcg by mouth daily.   desoximetasone (TOPICORT) 0.25 % cream Apply 1 application topically daily as needed (irritation).   EPINEPHrine 0.3 mg/0.3 mL IJ SOAJ injection Inject 0.3 mg into the muscle as needed for anaphylaxis.    fluticasone 0.05%-ketoconazole 2% 1:1 cream mixture Apply 1 application topically  daily as needed for irritation.   gentamicin ointment (GARAMYCIN) 0.1 % Apply 1 application topically daily as needed (nasal bleeds).   hydroquinone 4 % cream Apply 1 application topically 2 (two) times daily as needed (dark spots).   ipratropium (ATROVENT) 0.03 % nasal spray INSTILL 2 SPRAYS IN EACH NOSTRIL EVERY 12 HOURS   levocetirizine (XYZAL) 5 MG tablet Take 5 mg by mouth every evening.   linaclotide (LINZESS) 290 MCG CAPS capsule Take 1 capsule (290 mcg total) by mouth daily before breakfast.   loratadine (CLARITIN) 10 MG tablet Take 10 mg by mouth daily as needed for allergies.   MAGNESIUM PO Take 330 mg by mouth daily.   metoprolol succinate (TOPROL-XL) 100 MG 24 hr tablet TAKE 1 TABLET(100 MG) BY MOUTH DAILY   Omega-3 Fatty Acids (FISH OIL) 1000 MG CAPS Take 1,000 mg by mouth 3 (three) times a week.   ondansetron (ZOFRAN) 4 MG tablet TAKE 1 TABLET(4 MG) BY MOUTH EVERY 8 HOURS AS NEEDED FOR NAUSEA OR VOMITING   pantoprazole (PROTONIX) 40 MG tablet Take 40 mg by mouth 2 (two) times daily before a meal.   polyethylene glycol-electrolytes (NULYTELY) 420 g solution As directed   potassium chloride SA (KLOR-CON) 20 MEQ tablet TAKE 1 TABLET(20 MEQ) BY MOUTH DAILY   rosuvastatin (CRESTOR) 5 MG tablet TAKE 1 TABLET(5 MG) BY MOUTH DAILY   sodium chloride (OCEAN) 0.65 % SOLN nasal spray Place 1 spray into both nostrils as needed for congestion.   triamcinolone (NASACORT) 55 MCG/ACT AERO nasal inhaler Place 1 spray into the nose daily as needed (allergies).   triamterene-hydrochlorothiazide (MAXZIDE) 75-50 MG tablet Take 1 tablet by mouth daily.   valsartan (DIOVAN) 320 MG tablet TAKE 1 TABLET BY MOUTH EVERY DAY   vitamin E 400 UNIT capsule Take 400 Units by mouth 3 (three) times a week.   zinc gluconate 50 MG tablet Take 50 mg by mouth daily.   No facility-administered encounter medications on file as of 05/27/2021.    Current antihyperglycemic regimen:  None   What recent  interventions/DTPs have been made to improve glycemic control:    Have there been any recent hospitalizations or ED visits since last visit with [facility]?  Patient had coloscopy on 04/15/21 and ED visit on 03/21/21 for Lumbar pain.   Patient hypoglycemic symptoms, including    Patient hyperglycemic symptoms, including    How often are you checking your blood sugar?    What are your blood sugars ranging?  Fasting:  Before meals:  After meals:  Bedtime:   During the week, how often does your blood glucose drop below 70?   Are you checking your feet daily/regularly?     Adherence Review: Is the patient currently on a STATIN medication? Yes Is the patient currently on ACE/ARB medication? Yes Does the patient have >5 day gap between last estimated fill dates? No   Care Gaps  AWV: done 02/05/21 Colonoscopy: done 04/15/21 DM Eye Exam: due 12/11/20 DM Foot Exam: due 12/10/18 Microalbumin: done 01/20/21  HbgAIC: done 01/20/21 (5.9) DEXA: done 03/05/21 Mammogram: done 12/23/20  Star Rating  Drugs: valsartan (DIOVAN) 320 MG tablet - last filled 04/22/21 90 days  rosuvastatin (CRESTOR) 5 MG tablet - last filled 03/14/21 90 days    No future appointments.  Jobe Gibbon, [facility] Pharmacist Assistant  [phone]   Multiple attempts were made to contact patient. Attempts were unsuccessful. / ls,CMA

## 2021-05-28 ENCOUNTER — Other Ambulatory Visit: Payer: Medicare Other

## 2021-05-28 ENCOUNTER — Other Ambulatory Visit: Payer: Self-pay

## 2021-05-28 DIAGNOSIS — I10 Essential (primary) hypertension: Secondary | ICD-10-CM

## 2021-05-28 LAB — BASIC METABOLIC PANEL
BUN: 19 mg/dL (ref 7–25)
CO2: 30 mmol/L (ref 20–32)
Calcium: 10.1 mg/dL (ref 8.6–10.4)
Chloride: 102 mmol/L (ref 98–110)
Creat: 1.02 mg/dL (ref 0.50–1.05)
Glucose, Bld: 95 mg/dL (ref 65–99)
Potassium: 4.4 mmol/L (ref 3.5–5.3)
Sodium: 137 mmol/L (ref 135–146)

## 2021-06-05 ENCOUNTER — Other Ambulatory Visit: Payer: Self-pay | Admitting: Family Medicine

## 2021-06-05 ENCOUNTER — Other Ambulatory Visit: Payer: Self-pay | Admitting: Nurse Practitioner

## 2021-06-05 DIAGNOSIS — F419 Anxiety disorder, unspecified: Secondary | ICD-10-CM

## 2021-06-05 NOTE — Telephone Encounter (Signed)
Xanax refill request.  Last seen 05/18/2021, last filled 12/15/2020.

## 2021-06-10 DIAGNOSIS — H903 Sensorineural hearing loss, bilateral: Secondary | ICD-10-CM | POA: Diagnosis not present

## 2021-06-12 DIAGNOSIS — H43813 Vitreous degeneration, bilateral: Secondary | ICD-10-CM | POA: Diagnosis not present

## 2021-06-12 DIAGNOSIS — H33312 Horseshoe tear of retina without detachment, left eye: Secondary | ICD-10-CM | POA: Diagnosis not present

## 2021-06-17 ENCOUNTER — Telehealth: Payer: Self-pay

## 2021-06-17 DIAGNOSIS — G250 Essential tremor: Secondary | ICD-10-CM

## 2021-06-17 NOTE — Telephone Encounter (Signed)
Patient called requesting to be re-referred to [facility] instead of [facility].  Order placed.

## 2021-06-26 ENCOUNTER — Other Ambulatory Visit: Payer: Self-pay

## 2021-06-26 ENCOUNTER — Ambulatory Visit (INDEPENDENT_AMBULATORY_CARE_PROVIDER_SITE_OTHER): Payer: Medicare Other | Admitting: Family Medicine

## 2021-06-26 ENCOUNTER — Encounter: Payer: Self-pay | Admitting: Family Medicine

## 2021-06-26 ENCOUNTER — Telehealth: Payer: Self-pay | Admitting: Pharmacist

## 2021-06-26 VITALS — BP 142/68 | HR 73 | Temp 98.1°F | Resp 18 | Ht 66.0 in | Wt 171.0 lb

## 2021-06-26 DIAGNOSIS — J069 Acute upper respiratory infection, unspecified: Secondary | ICD-10-CM

## 2021-06-26 MED ORDER — HYDROCODONE BIT-HOMATROP MBR 5-1.5 MG/5ML PO SOLN: 5.0000 mL | Freq: Three times a day (TID) | ORAL | 0 refills | Status: DC | PRN

## 2021-06-26 NOTE — Progress Notes (Signed)
Chronic Care Management Pharmacy Assistant   Name: [name]  MRN: [id number] DOB: [birth year]   Reason for Encounter: Disease State - General Adherence Call      Recent office visits:  05/18/21 [name], MD - Family Medicine - Abnormal urine color -  Labs were ordered. triamterene-hydrochlorothiazide (MAXZIDE) 75-50 MG tablet prescribed. Disconitnued HCTZ. Follow up in 2 weeks for BP check and BMP.   03/19/21 [name], MD - Family Medicine - Chest wall Pain - Labs were ordered. Chest xray ordered. I believe this is musculoskeletal chest wall pain.  Obtain an x-ray to rule out any underlying pulmonary pathology.  Palpation does not elicit pain today on exam so I do not feel that she is cracked a rib.  There is no relationship of the pain to food so I do not feel that this is an ulcer.  Her gallbladder has been removed.  I will obtain some baseline labs as well as a chest x-ray and then monitor the pain for the next 2 to 3 weeks.  If the pain goes away no further work-up is necessary assuming that chest x-ray and the labs are normal.     Recent consult visits:  03/11/21 [name], PA-C - Gastroenterology - History of Colonic polyps - Continue pantoprazole 40 mg once to twice daily, use lowest effective dose. Continue Linzess 290 mcg daily for constipation. Colonoscopy to be scheduled.    Hospital visits: 04/15/21 Medication Reconciliation was completed by comparing discharge summary, patients EMR and Pharmacy list, and upon discussion with patient.   Admitted to the hospital on 04/15/21 due to Colonoscopy. Discharge date was 04/15/21. Discharged from [facility]?Medications Started at [facility] Discharge:?? None noted.   Medication Changes at Hospital Discharge: None noted.    Medications Discontinued at Hospital Discharge: None noted.   Medications that remain the same after Hospital Discharge:??  All other medications will remain the same.      Hospital visits: 03/21/21 Medication Reconciliation was completed by comparing discharge summary, patients EMR and Pharmacy list, and upon discussion with patient.   Admitted to the hospital on 03/21/21 due to Strain of Lumbar region. Discharge date was 03/21/21. Discharged from [place]?Medications Started at [facility] Discharge:?? methocarbamol (ROBAXIN) 500 MG tablet   Medication Changes at Hospital Discharge: None noted.    Medications Discontinued at Hospital Discharge: None noted.    Medications that remain the same after Hospital Discharge:??  All other medications will remain the same.    Medications: Outpatient Encounter Medications as of 06/26/2021  Medication Sig   acetaminophen (TYLENOL) 500 MG tablet Take 1,000 mg by mouth every 6 (six) hours as needed for moderate pain or headache.   ALPRAZolam (XANAX) 0.5 MG tablet TAKE 1 TABLET(0.5 MG) BY MOUTH TWICE DAILY AS NEEDED FOR ANXIETY   amLODipine (NORVASC) 10 MG tablet TAKE 1 TABLET(10 MG) BY MOUTH AT BEDTIME   aspirin EC 81 MG tablet Take 81 mg by mouth at bedtime.    Calcium 500 MG tablet Take 500 mg by mouth in the morning and at bedtime.   Carboxymethylcellul-Glycerin (CLEAR EYES FOR DRY EYES) 1-0.25 % SOLN Place 1 drop into both eyes daily as needed (dry eyes).   cholecalciferol (VITAMIN D3) 25 MCG (1000 UT) tablet Take 2,000 Units by mouth daily.   cloNIDine (CATAPRES) 0.1 MG tablet Take 0.1 mg by mouth daily as needed (High Blood Pressure).   Coenzyme Q10 (  COQ10) 100 MG CAPS Take 100 mg by mouth daily.   CRANBERRY PO Take 1 each by mouth 2 (two) times a week. Chewables   Cyanocobalamin (B-12) 2500 MCG TABS Take 2,500 mcg by mouth daily.   desoximetasone (TOPICORT) 0.25 % cream Apply 1 application topically daily as needed (irritation).   EPINEPHrine 0.3 mg/0.3 mL IJ SOAJ injection Inject 0.3 mg into the muscle as needed for anaphylaxis.    fluticasone 0.05%-ketoconazole 2% 1:1 cream mixture Apply  1 application topically daily as needed for irritation.   gentamicin ointment (GARAMYCIN) 0.1 % Apply 1 application topically daily as needed (nasal bleeds).   hydroquinone 4 % cream Apply 1 application topically 2 (two) times daily as needed (dark spots).   ipratropium (ATROVENT) 0.03 % nasal spray INSTILL 2 SPRAYS IN EACH NOSTRIL EVERY 12 HOURS   levocetirizine (XYZAL) 5 MG tablet Take 5 mg by mouth every evening.   linaclotide (LINZESS) 290 MCG CAPS capsule Take 1 capsule (290 mcg total) by mouth daily before breakfast.   loratadine (CLARITIN) 10 MG tablet Take 10 mg by mouth daily as needed for allergies.   MAGNESIUM PO Take 330 mg by mouth daily.   metoprolol succinate (TOPROL-XL) 100 MG 24 hr tablet TAKE 1 TABLET(100 MG) BY MOUTH DAILY   Omega-3 Fatty Acids (FISH OIL) 1000 MG CAPS Take 1,000 mg by mouth 3 (three) times a week.   ondansetron (ZOFRAN) 4 MG tablet TAKE 1 TABLET(4 MG) BY MOUTH EVERY 8 HOURS AS NEEDED FOR NAUSEA OR VOMITING   pantoprazole (PROTONIX) 40 MG tablet TAKE 1 TABLET BY MOUTH TWICE DAILY 30 MINUTES BEFORE MEALS   polyethylene glycol-electrolytes (NULYTELY) 420 g solution As directed   potassium chloride SA (KLOR-CON) 20 MEQ tablet TAKE 1 TABLET(20 MEQ) BY MOUTH DAILY   rosuvastatin (CRESTOR) 5 MG tablet TAKE 1 TABLET(5 MG) BY MOUTH DAILY   sodium chloride (OCEAN) 0.65 % SOLN nasal spray Place 1 spray into both nostrils as needed for congestion.   triamcinolone (NASACORT) 55 MCG/ACT AERO nasal inhaler Place 1 spray into the nose daily as needed (allergies).   triamterene-hydrochlorothiazide (MAXZIDE) 75-50 MG tablet Take 1 tablet by mouth daily.   valsartan (DIOVAN) 320 MG tablet TAKE 1 TABLET BY MOUTH EVERY DAY   vitamin E 400 UNIT capsule Take 400 Units by mouth 3 (three) times a week.   zinc gluconate 50 MG tablet Take 50 mg by mouth daily.   No facility-administered encounter medications on file as of 06/26/2021.    Have you had any problems recently with your  health? Patient denied having any issues or problems with her health. She stated she is on her way to a doctor appointment this morning and is doing well.   Have you had any problems with your pharmacy? Patient denied having any problems with her pharmacy and reported she is doing well with all her medications.   What issues or side effects are you having with your medications? Patient denied having and issues or side effects with her current medications.   What would you like me to pass along to Leata Mouse, CPP for them to help you with?  Patient did not have anything to pass along to CPP at this time. She reported she is fine.   What can we do to take care of you better? Patient did not have any recommendations at this time. She is satisfied with her current level of care and thanked me for the call.    Care Gaps  AWV: done 02/05/21 Colonoscopy: done 04/15/21 DM Eye Exam: due 12/11/20 DM Foot Exam: due 12/10/18 Microalbumin: done 01/20/21  HbgAIC: done 01/20/21 (5.9) DEXA: done 03/05/21 Mammogram: done 12/23/20   Star Rating Drugs: valsartan (DIOVAN) 320 MG tablet - last filled 04/22/21 90 days  rosuvastatin (CRESTOR) 5 MG tablet - last filled 03/14/21 90 days   Future Appointments  Date Time Provider [place]  06/26/2021 11:30 AM Susy Frizzle, MD [facility] None  07/13/2021 10:45 AM Josue Hector, MD [facility] [place], [place] Pharmacist Assistant  [phone]

## 2021-06-26 NOTE — Progress Notes (Signed)
Subjective:    Patient ID: [name], female    DOB: Jan 19, 1956, 66 y.o.   MRN: [id number]  Patient reports a 5-day history of runny nose, head congestion, postnasal drip.  She also reports a cough that is worse at night when she lays down.  The cough is productive of yellow sputum.  She reports pressure in her sinuses but no pain.  She has been taking Coricidin with minimal benefit.  She denies any fever or chills or shortness of breath or chest pain.  She denies sinus pain or headache.  She denies any nausea or vomiting she denies any myalgias or malaise.  The cough sounds more like an irritant at night that is keeping her awake.  Past Medical History:  Diagnosis Date   Abdominal aortic aneurysm (AAA) 3.0 cm to 5.0 cm in diameter in female    None seen on prior imaging?    Anxiety    Cataract    Chronic left shoulder pain    Headache    Hiatal hernia    small   Hyperlipidemia    Hypertension    Pre-diabetes    Schatzki's ring    Seasonal allergies    Tremor      Past Surgical History:  Procedure Laterality Date   CARDIAC CATHETERIZATION  2005   "Ms. Idleman has essentially normal coronary arteries and normal left ventricular function.  She will be treated empirically with anti-reflux measures"   CATARACT EXTRACTION, BILATERAL     CHOLECYSTECTOMY     COLONOSCOPY    06/16/2004   FUX:NATFTD rectum/Diminutive polyps of the splenic flexure and the cecum/The remainder of the colonic mucosa appeared normal pathology (hyperplastic polyps).   COLONOSCOPY N/A 08/07/2015   Dr. Gala Romney: 6 mm descending colon tubular adenoma, multiple medium-mouthed diverticula in sigmoid colon. surveillance 2022   COLONOSCOPY N/A 04/15/2021   Procedure: COLONOSCOPY;  Surgeon: Daneil Dolin, MD;  [facility]: [facility];  Service: Endoscopy;  Laterality: N/A;  9:30am   ESOPHAGOGASTRODUODENOSCOPY  09/11/2001   DUK:GURKYHCWC ring otherwise normal esophagus/Normal stomach/Normal  D1 and D2 status post passage of a 56 French Maloney dilator   ESOPHAGOGASTRODUODENOSCOPY N/A 11/11/2017   Procedure: ESOPHAGOGASTRODUODENOSCOPY (EGD);  Surgeon: Daneil Dolin, MD; mild Schatzki ring s/p dilation and mild erosive gastropathy.   ESOPHAGOGASTRODUODENOSCOPY (EGD) WITH ESOPHAGEAL DILATION N/A 01/25/2013   Dr. Gala Romney- schatzki's ring- 77 F dilation. small hiatal hernia   HYSTEROSCOPY WITH D & C N/A 03/06/2019   Procedure: DILATATION AND CURETTAGE /HYSTEROSCOPY;  Surgeon: Jonnie Kind, MD;  Location: [facility];  Service: Gynecology;  Laterality: N/A;   MALONEY DILATION N/A 11/11/2017   Procedure: [name] DILATION;  Surgeon: Daneil Dolin, MD;  Location: [facility];  Service: Endoscopy;  Laterality: N/A;   POLYPECTOMY N/A 03/06/2019   Procedure: POLYPECTOMY ( REMOVAL OF ENDOMETRIAL POLYP);  Surgeon: Jonnie Kind, MD;  Location: [facility];  Service: Gynecology;  Laterality: N/A;   POLYPECTOMY  04/15/2021   Procedure: POLYPECTOMY;  Surgeon: Daneil Dolin, MD;  Location: [facility];  Service: Endoscopy;;   TUBAL LIGATION     Current Outpatient Medications on File Prior to Visit  Medication Sig Dispense Refill   acetaminophen (TYLENOL) 500 MG tablet Take 1,000 mg by mouth every 6 (six) hours as needed for moderate pain or headache.     ALPRAZolam (XANAX) 0.5 MG tablet TAKE 1 TABLET(0.5 MG) BY MOUTH TWICE DAILY AS NEEDED FOR ANXIETY 60 tablet 0   amLODipine (NORVASC) 10  MG tablet TAKE 1 TABLET(10 MG) BY MOUTH AT BEDTIME 90 tablet 3   aspirin EC 81 MG tablet Take 81 mg by mouth at bedtime.      Calcium 500 MG tablet Take 500 mg by mouth in the morning and at bedtime.     Carboxymethylcellul-Glycerin (CLEAR EYES FOR DRY EYES) 1-0.25 % SOLN Place 1 drop into both eyes daily as needed (dry eyes).     cholecalciferol (VITAMIN D3) 25 MCG (1000 UT) tablet Take 2,000 Units by mouth daily.     cloNIDine (CATAPRES) 0.1 MG tablet Take 0.1 mg by mouth daily as needed (High  Blood Pressure).     Coenzyme Q10 (COQ10) 100 MG CAPS Take 100 mg by mouth daily.     CRANBERRY PO Take 1 each by mouth 2 (two) times a week. Chewables     Cyanocobalamin (B-12) 2500 MCG TABS Take 2,500 mcg by mouth daily.     desoximetasone (TOPICORT) 0.25 % cream Apply 1 application topically daily as needed (irritation).     EPINEPHrine 0.3 mg/0.3 mL IJ SOAJ injection Inject 0.3 mg into the muscle as needed for anaphylaxis.      fluticasone 0.05%-ketoconazole 2% 1:1 cream mixture Apply 1 application topically daily as needed for irritation.     gentamicin ointment (GARAMYCIN) 0.1 % Apply 1 application topically daily as needed (nasal bleeds).     hydroquinone 4 % cream Apply 1 application topically 2 (two) times daily as needed (dark spots).     ipratropium (ATROVENT) 0.03 % nasal spray INSTILL 2 SPRAYS IN EACH NOSTRIL EVERY 12 HOURS 30 mL 12   levocetirizine (XYZAL) 5 MG tablet Take 5 mg by mouth every evening.     linaclotide (LINZESS) 290 MCG CAPS capsule Take 1 capsule (290 mcg total) by mouth daily before breakfast. 90 capsule 3   loratadine (CLARITIN) 10 MG tablet Take 10 mg by mouth daily as needed for allergies.     MAGNESIUM PO Take 330 mg by mouth daily.     metoprolol succinate (TOPROL-XL) 100 MG 24 hr tablet TAKE 1 TABLET(100 MG) BY MOUTH DAILY 90 tablet 3   Omega-3 Fatty Acids (FISH OIL) 1000 MG CAPS Take 1,000 mg by mouth 3 (three) times a week.     ondansetron (ZOFRAN) 4 MG tablet TAKE 1 TABLET(4 MG) BY MOUTH EVERY 8 HOURS AS NEEDED FOR NAUSEA OR VOMITING 30 tablet 0   pantoprazole (PROTONIX) 40 MG tablet TAKE 1 TABLET BY MOUTH TWICE DAILY 30 MINUTES BEFORE MEALS 60 tablet 5   polyethylene glycol-electrolytes (NULYTELY) 420 g solution As directed 4000 mL 0   potassium chloride SA (KLOR-CON) 20 MEQ tablet TAKE 1 TABLET(20 MEQ) BY MOUTH DAILY 30 tablet 5   rosuvastatin (CRESTOR) 5 MG tablet TAKE 1 TABLET(5 MG) BY MOUTH DAILY 90 tablet 3   sodium chloride  (OCEAN) 0.65 % SOLN nasal spray Place 1 spray into both nostrils as needed for congestion.     triamcinolone (NASACORT) 55 MCG/ACT AERO nasal inhaler Place 1 spray into the nose daily as needed (allergies).     triamterene-hydrochlorothiazide (MAXZIDE) 75-50 MG tablet Take 1 tablet by mouth daily. 90 tablet 3   valsartan (DIOVAN) 320 MG tablet TAKE 1 TABLET BY MOUTH EVERY DAY 90 tablet 1   vitamin E 400 UNIT capsule Take 400 Units by mouth 3 (three) times a week.     zinc gluconate 50 MG tablet Take 50 mg by mouth daily.     No current facility-administered medications on  file prior to visit.   Allergies  Allergen Reactions   Ciprofloxacin Shortness Of Breath   Latex Other (See Comments)    Burns skin   Vitamin C [Bioflavonoid Products] Itching   Social History   Socioeconomic History   Marital status: Single    Spouse name: Not on file   Number of children: 3   Years of education: Not on file   Highest education level: Not on file  Occupational History   Occupation: English as a second language teacher: [employer]  Tobacco Use   Smoking status: Former    Packs/day: 0.25    Years: 35.00    Pack years: 8.75    Types: Cigarettes    Quit date: 12/31/2012    Years since quitting: 8.4   Smokeless tobacco: Never  Vaping Use   Vaping Use: Never used  Substance and Sexual Activity   Alcohol use: Not Currently   Drug use: No   Sexual activity: Not Currently    Partners: Male    Birth control/protection: Post-menopausal, Surgical    Comment: tubal  Other Topics Concern   Not on file  Social History Narrative   Eats all food groups.    Wears seatbelt.    Has 3 children.   Prior smoker.   Attends church.    Used to work in [employer].    Drives.   Enjoys going out with family.    Social Determinants of Health   Financial Resource Strain: Not on file  Food Insecurity: Not on file  Transportation Needs: Not on file  Physical Activity: Not on file  Stress:  Not on file  Social Connections: Not on file  Intimate Partner Violence: Not on file   Family History  Problem Relation Age of Onset   Diabetes Other    Diabetes Mother    Heart disease Mother    Heart disease Father    Diabetes Sister    Diabetes Brother    Hypertension Brother    Diabetes Daughter    Hypertension Maternal Grandmother    Stroke Maternal Grandfather    Early death Paternal Grandfather    Colon cancer Neg Hx    Liver disease Neg Hx       Review of Systems  Neurological:  Positive for headaches.  All other systems reviewed and are negative.     Objective:   Physical Exam Vitals reviewed.  Constitutional:      Appearance: She is well-developed.  HENT:     Right Ear: Tympanic membrane and ear canal normal.     Left Ear: Tympanic membrane and ear canal normal.     Nose: Congestion present.     Right Sinus: No maxillary sinus tenderness or frontal sinus tenderness.     Left Sinus: No maxillary sinus tenderness or frontal sinus tenderness.  Eyes:     General: No visual field deficit.    Extraocular Movements:     Right eye: Normal extraocular motion and no nystagmus.     Left eye: Normal extraocular motion and no nystagmus.     Conjunctiva/sclera: Conjunctivae normal.     Pupils:     Right eye: Pupil is round and reactive.     Left eye: Pupil is round and reactive.  Cardiovascular:     Rate and Rhythm: Normal rate and regular rhythm.     Heart sounds: Normal heart sounds. No murmur heard.   No friction rub. No gallop.  Pulmonary:     Effort: Pulmonary effort  is normal. No respiratory distress.     Breath sounds: Normal breath sounds. No stridor. No wheezing or rales.  Abdominal:     General: Abdomen is flat. Bowel sounds are normal. There is no distension.     Palpations: Abdomen is soft.     Tenderness: There is no abdominal tenderness. There is no guarding.  Musculoskeletal:     Right hand: Normal. No swelling, deformity, tenderness  or bony tenderness. Normal range of motion.     Left hand: Normal. No swelling, deformity, tenderness or bony tenderness. Normal range of motion.     Cervical back: Normal range of motion. No rigidity.  Neurological:     Mental Status: She is alert and oriented to person, place, and time. Mental status is at baseline.     Cranial Nerves: No cranial nerve deficit, dysarthria or facial asymmetry.     Sensory: No sensory deficit.     Motor: Tremor present. No weakness or abnormal muscle tone.     Coordination: Romberg sign negative. Coordination normal.     Gait: Gait normal.          Assessment & Plan:  Viral upper respiratory tract infection I believe the patient has a viral upper respiratory infection.  Recommended that she continue Coricidin HBP for head congestion and cough.  We can supplement with Hycodan 1 teaspoon every 6 hours as needed for cough.  Recommended tincture of time.  Anticipate gradual resolution of symptoms over the next 3 to 4 days.  Recheck immediately if she develops shortness of breath or chest pain or fever or purulent sputum.

## 2021-07-07 NOTE — Progress Notes (Signed)
CARDIOLOGY CONSULT NOTE       Patient ID: [name] MRN: [id number] DOB/AGE: 12-06-1955 66 y.o.  Admit date: (Not on file) Referring Physician: Pickard Primary Physician: Susy Frizzle, MD Primary Cardiologist: Johnsie Cancel  Reason for Consultation: HTN, elevated troponin     HPI:  66 y.o. with labile HTN, HLD, history of esophageal web with dilatation and GERD Seen in [facility] ER October 2020  For hypertensive urgency BP 081 systolic. Dr Dennard Schaumann had started her on clonidine patch and adjusted her ARB Complained of fluttering in chest for 4 days Compliant with meds Labs ok except K 3.3 Troponin 18->19 no trend Not sure why checked as she had no chest pain. CXR NAD CT head No acute findings Recommended admission But left AMA ECG non acute with LAE non LVH no acute ST changes 02/02/19 Previous smoker quit in 2014  Myovue normal 03/2020   She follows with Dr Donzetta Matters [facility] She has left bruit with duplex 02/10/21 showing 50-69% left and > 70% RICA stenosis progressive Carries Diagnosis of AAA but duplex 11/22/18 showed largest diameter only 2.6 cm Mild claudication with ABI's 12/12/19 O.63 on right and 0.76 on left   She has no documented history of CAD  Had normal cath 2014 with pain ascribed to GI cause  Non ischemic myovue November 2021  and 08/22/20 EF 62% BP seems sub optimally controled She is on statin for HLD  Seen in ED 05/11/20 with transient increase in BP chronic headache and numbness in face Head CT negative Chronic abdominal pain CT abdomen only diverticulosis 02/17/20   3 children. [name] is a traveling nurse  She has some acute/chronic left knee pain. Had arthroscopy since I last saw her   She has a long standing tremor which is stable with no signs Parkinson's     ROS All other systems reviewed and negative except as noted above  Past Medical History:  Diagnosis Date   Abdominal aortic aneurysm (AAA) 3.0 cm to 5.0 cm in diameter in female    None seen on prior  imaging?    Anxiety    Cataract    Chronic left shoulder pain    Headache    Hiatal hernia    small   Hyperlipidemia    Hypertension    Pre-diabetes    Schatzki's ring    Seasonal allergies    Tremor     Family History  Problem Relation Age of Onset   Diabetes Other    Diabetes Mother    Heart disease Mother    Heart disease Father    Diabetes Sister    Diabetes Brother    Hypertension Brother    Diabetes Daughter    Hypertension Maternal Grandmother    Stroke Maternal Grandfather    Early death Paternal Grandfather    Colon cancer Neg Hx    Liver disease Neg Hx     Social History   Socioeconomic History   Marital status: Single    Spouse name: Not on file   Number of children: 3   Years of education: Not on file   Highest education level: Not on file  Occupational History   Occupation: English as a second language teacher: [employer]  Tobacco Use   Smoking status: Former    Packs/day: 0.25    Years: 35.00    Pack years: 8.75    Types: Cigarettes    Quit date: 12/31/2012    Years since quitting: 8.5   Smokeless tobacco:  Never  Vaping Use   Vaping Use: Never used  Substance and Sexual Activity   Alcohol use: Not Currently   Drug use: No   Sexual activity: Not Currently    Partners: Male    Birth control/protection: Post-menopausal, Surgical    Comment: tubal  Other Topics Concern   Not on file  Social History Narrative   Eats all food groups.    Wears seatbelt.    Has 3 children.   Prior smoker.   Attends church.    Used to work in [employer] fund[employer].    Drives.   Enjoys going out with family.    Social Determinants of Health   Financial Resource Strain: Not on file  Food Insecurity: Not on file  Transportation Needs: Not on file  Physical Activity: Not on file  Stress: Not on file  Social Connections: Not on file  Intimate Partner Violence: Not on file    Past Surgical History:  Procedure Laterality Date   CARDIAC CATHETERIZATION  2005   "[name] has essentially normal coronary arteries and normal left ventricular function.  She will be treated empirically with anti-reflux measures"   CATARACT EXTRACTION, BILATERAL     CHOLECYSTECTOMY     COLONOSCOPY    06/16/2004   IOE:VOJJKK rectum/Diminutive polyps of the splenic flexure and the cecum/The remainder of the colonic mucosa appeared normal pathology (hyperplastic polyps).   COLONOSCOPY N/A 08/07/2015   Dr. Gala Romney: 6 mm descending colon tubular adenoma, multiple medium-mouthed diverticula in sigmoid colon. surveillance 2022   COLONOSCOPY N/A 04/15/2021   Procedure: COLONOSCOPY;  Surgeon: Daneil Dolin, MD;  Location: [facility];  Service: Endoscopy;  Laterality: N/A;  9:30am   ESOPHAGOGASTRODUODENOSCOPY  09/11/2001   XFG:[id number] ring otherwise normal esophagus/Normal stomach/Normal D1 and D2 status post passage of a 56 French Maloney dilator   ESOPHAGOGASTRODUODENOSCOPY N/A 11/11/2017   Procedure: ESOPHAGOGASTRODUODENOSCOPY (EGD);  Surgeon: Daneil Dolin, MD; mild Schatzki ring s/p dilation and mild erosive gastropathy.   ESOPHAGOGASTRODUODENOSCOPY (EGD) WITH ESOPHAGEAL DILATION N/A 01/25/2013   Dr. Gala Romney- schatzki's ring- 64 F dilation. small hiatal hernia   HYSTEROSCOPY WITH D & C N/A 03/06/2019   Procedure: DILATATION AND CURETTAGE /HYSTEROSCOPY;  Surgeon: Jonnie Kind, MD;  Location: [facility];  Service: Gynecology;  Laterality: N/A;   MALONEY DILATION N/A 11/11/2017   Procedure: [name] DILATION;  Surgeon: Daneil Dolin, MD;  Location: [facility];  Service: Endoscopy;  Laterality: N/A;   POLYPECTOMY N/A 03/06/2019   Procedure: POLYPECTOMY ( REMOVAL OF ENDOMETRIAL POLYP);  Surgeon: Jonnie Kind, MD;  Location: [facility];  Service: Gynecology;  Laterality: N/A;   POLYPECTOMY  04/15/2021   Procedure: POLYPECTOMY;  Surgeon: Daneil Dolin, MD;  Location: [facility];  Service: Endoscopy;;   TUBAL LIGATION          Physical Exam: Blood pressure (!) 146/58,  pulse 70, height 5\' 6"  (1.676 m), weight 169 lb (76.7 kg), SpO2 98 %.    Affect appropriate Healthy:  appears stated age 66: normal Neck supple with no adenopathy JVP normal bilateral  bruits no thyromegaly Lungs clear with no wheezing and good diaphragmatic motion Heart:  S1/S2 short 2/6 SEM murmur, no rub, gallop or click PMI normal Abdomen: benighn, BS positve, no tenderness, no AAA no bruit.  No HSM or HJR Distal pulses intact with no bruits No edema Neuro non-focal UE tremors  Skin warm and dry Post arthroscopy left knee  Decreased pedal pulses    Labs:  Lab Results  Component Value Date   WBC 8.0 07/08/2021   HGB 13.4 07/08/2021   HCT 40.9 07/08/2021   MCV 90.1 07/08/2021   PLT 335 07/08/2021    Recent Labs  Lab 07/08/21 1539  NA 135  K 3.7  CL 100  CO2 25  BUN 10  CREATININE 0.77  CALCIUM 9.5  GLUCOSE 92   Lab Results  Component Value Date   CKTOTAL 78 06/10/2020   TROPONINI <0.03 12/28/2017    Lab Results  Component Value Date   CHOL 163 01/20/2021   CHOL 157 06/27/2018   CHOL 181 12/09/2017   Lab Results  Component Value Date   HDL 60 01/20/2021   HDL 66 06/27/2018   HDL 51 12/09/2017   Lab Results  Component Value Date   LDLCALC 89 01/20/2021   LDLCALC 77 06/27/2018   LDLCALC 111 (H) 12/09/2017   Lab Results  Component Value Date   TRIG 62 01/20/2021   TRIG 49 06/27/2018   TRIG 89 12/09/2017   Lab Results  Component Value Date   CHOLHDL 2.7 01/20/2021   CHOLHDL 2.4 06/27/2018   CHOLHDL 3.5 12/09/2017   No results found for: LDLDIRECT    Radiology: DG Chest Portable 1 View  Result Date: 07/08/2021 CLINICAL DATA:  Left-sided chest pain EXAM: PORTABLE CHEST 1 VIEW COMPARISON:  03/19/2021 FINDINGS: The heart size and mediastinal contours are within normal limits. Both lungs are clear. The visualized skeletal structures are unremarkable. IMPRESSION: No active disease. Electronically Signed   By: Kathreen Devoid M.D.   On:  07/08/2021 15:28    EKG: See HPI   ASSESSMENT AND PLAN:   1. Elevated Troponin:  In absence of chest pain. Multiple risk factors and known carotid vascular disease Normal myovue 03/28/19 and more recently 08/22/20  2. HTN:  Labile f/u Dr Dennard Schaumann currently on Diovan 320 mg Toprol 100 mg  Hydrodiuril 25 mg Cardura 2 mg and norvasc 10 mg  3.  HLD:  On statin target LDL 70 or less with carotid disease  4. Bruit:  F/U [facility] Dr Donzetta Matters Her diastolic velocity on duplex 08/22/20 was < 1cm2 so not clear that progressive dx is surgical have sent note to Dr Donzetta Matters for f/u   5. GI: history of GERD and esophageal dilatation seen by GI recently and repeat EGD deferred Continue Linzess and protonix  6. PVD:  Mild decrease in ABI's 02/18/21 moderate range  no limiting claudication f/u Dr Donzetta Matters palpable pedal pulses bilaterally  7. Ortho: left knee arthroscopy improved   F/U in a year  F/U [facility]  Duplex April carotids   Signed: Jenkins Rouge 07/13/2021, 10:39 AM

## 2021-07-08 ENCOUNTER — Emergency Department (HOSPITAL_COMMUNITY): Payer: Medicare Other

## 2021-07-08 ENCOUNTER — Other Ambulatory Visit: Payer: Self-pay

## 2021-07-08 ENCOUNTER — Emergency Department (HOSPITAL_COMMUNITY)
Admission: EM | Admit: 2021-07-08 | Discharge: 2021-07-08 | Disposition: A | Discharge status: Home / Self Care | Payer: Medicare Other | Attending: Emergency Medicine | Admitting: Emergency Medicine

## 2021-07-08 DIAGNOSIS — R0789 Other chest pain: Secondary | ICD-10-CM | POA: Diagnosis not present

## 2021-07-08 DIAGNOSIS — R079 Chest pain, unspecified: Secondary | ICD-10-CM

## 2021-07-08 DIAGNOSIS — Z91040 Latex allergy status: Secondary | ICD-10-CM | POA: Diagnosis not present

## 2021-07-08 DIAGNOSIS — R072 Precordial pain: Secondary | ICD-10-CM | POA: Diagnosis not present

## 2021-07-08 DIAGNOSIS — M546 Pain in thoracic spine: Secondary | ICD-10-CM | POA: Insufficient documentation

## 2021-07-08 DIAGNOSIS — I10 Essential (primary) hypertension: Secondary | ICD-10-CM | POA: Insufficient documentation

## 2021-07-08 DIAGNOSIS — Z7982 Long term (current) use of aspirin: Secondary | ICD-10-CM | POA: Diagnosis not present

## 2021-07-08 DIAGNOSIS — Z79899 Other long term (current) drug therapy: Secondary | ICD-10-CM | POA: Insufficient documentation

## 2021-07-08 LAB — BASIC METABOLIC PANEL
Anion gap: 10 (ref 5–15)
BUN: 10 mg/dL (ref 8–23)
CO2: 25 mmol/L (ref 22–32)
Calcium: 9.5 mg/dL (ref 8.9–10.3)
Chloride: 100 mmol/L (ref 98–111)
Creatinine, Ser: 0.77 mg/dL (ref 0.44–1.00)
GFR, Estimated: >60 mL/min (ref >60)
Glucose, Bld: 92 mg/dL (ref 70–99)
Potassium: 3.7 mmol/L (ref 3.5–5.1)
Sodium: 135 mmol/L (ref 135–145)

## 2021-07-08 LAB — CBC
HCT: 40.9 % (ref 36.0–46.0)
Hemoglobin: 13.4 g/dL (ref 12.0–15.0)
MCH: 29.5 pg (ref 26.0–34.0)
MCHC: 32.8 g/dL (ref 30.0–36.0)
MCV: 90.1 fL (ref 80.0–100.0)
Platelets: 335 10*3/uL (ref 150–400)
RBC: 4.54 MIL/uL (ref 3.87–5.11)
RDW: 12.6 % (ref 11.5–15.5)
WBC: 8 10*3/uL (ref 4.0–10.5)
nRBC: 0 % (ref 0.0–0.2)

## 2021-07-08 LAB — TROPONIN I (HIGH SENSITIVITY)
Troponin I (High Sensitivity): 8 ng/L (ref <18)
Troponin I (High Sensitivity): 9 ng/L (ref <18)

## 2021-07-08 MED ORDER — HYDRALAZINE HCL 20 MG/ML IJ SOLN: 10.0000 mg | Freq: Once | INTRAMUSCULAR | Status: DC

## 2021-07-08 NOTE — Discharge Instructions (Addendum)
Your blood pressure has improved and your lab test, EKG and chest x-ray are reassuring today.  Plan to keep your appointment with Dr. Johnsie Cancel next week. ?

## 2021-07-08 NOTE — ED Provider Notes (Signed)
[facility] EMERGENCY DEPARTMENT Provider Note   CSN: [id number] Arrival date & time: 07/08/21  1449     History  Chief Complaint  Patient presents with   Chest Pain   Hypertension    [name] is a 66 y.o. female with a history significant for hypertension, prediabetes, hyperlipidemia, patient was also told she has an abdominal aortic aneurysm, however imaging as of 02/17/2020 was negative for chest abdomen or pelvis aneurysm or dissection presenting for evaluation of elevated blood pressure in association with headache, upper back pain and episodic chest pain.  She endorses having several episodes of stabbing fleeting midsternal chest pain yesterday but otherwise felt okay, then woke around 4 AM today with a headache with pain radiating into her upper back.  She checked her blood pressure and found it to be 101/98.  She states similar symptoms with other episodes of uncontrolled hypertension.  She also reports midsternal chest pressure today which has been intermittent lasting for 20 minutes then resolving throughout the morning.  This symptom resolved prior to arrival.  She denies missing any doses of her blood pressure medications, she is currently on Norvasc metoprolol and Maxide and Diovan.  She also has clonidine 0.1 mg tablets which she has been told to take as needed for spikes in her blood pressure.  She took one half of a tablet prior to arrival.  She also chewed an adult aspirin 325 mg.  She denies nausea or vomiting, palpitations, shortness of breath.  She also denies abdominal pain, fevers or chills, significant cough, peripheral edema.   The history is provided by the patient.      Home Medications Prior to Admission medications   Medication Sig Start Date End Date Taking? Authorizing Provider  acetaminophen (TYLENOL) 500 MG tablet Take 500 mg by mouth every 6 (six) hours as needed for moderate pain or headache.   Yes [provider]  ALPRAZolam (XANAX) 0.5  MG tablet TAKE 1 TABLET(0.5 MG) BY MOUTH TWICE DAILY AS NEEDED FOR ANXIETY Patient taking differently: Take 0.5 mg by mouth 2 (two) times daily as needed for anxiety. 06/05/21  Yes Susy Frizzle, MD  amLODipine (NORVASC) 10 MG tablet TAKE 1 TABLET(10 MG) BY MOUTH AT BEDTIME Patient taking differently: Take 10 mg by mouth at bedtime. 09/17/20  Yes Susy Frizzle, MD  aspirin EC 81 MG tablet Take 81 mg by mouth at bedtime.    Yes [provider]  Calcium 500 MG tablet Take 500 mg by mouth in the morning and at bedtime.   Yes [provider]  Carboxymethylcellul-Glycerin (CLEAR EYES FOR DRY EYES) 1-0.25 % SOLN Place 1 drop into both eyes daily as needed (dry eyes).   Yes [provider]  cloNIDine (CATAPRES) 0.1 MG tablet Take 0.1 mg by mouth See admin instructions. Take 1/2 tablet daily as needed for blood pressure   Yes [provider]  Coenzyme Q10 (COQ10) 100 MG CAPS Take 100 mg by mouth daily.   Yes [provider]  CRANBERRY PO Take 1 each by mouth 2 (two) times a week. Chewables   Yes [provider]  Cyanocobalamin (B-12) 2500 MCG TABS Take 2,500 mcg by mouth daily.   Yes [provider]  EPINEPHrine 0.3 mg/0.3 mL IJ SOAJ injection Inject 0.3 mg into the muscle as needed for anaphylaxis.  01/05/19  Yes [provider]  gentamicin ointment (GARAMYCIN) 0.1 % Apply 1 application topically daily as needed (nasal bleeds).   Yes  [provider]  hydroquinone 4 % cream Apply 1 application topically 2 (two) times daily as needed (dark spots). 01/05/21  Yes [provider]  linaclotide Rolan Lipa) 290 MCG CAPS capsule Take 1 capsule (290 mcg total) by mouth daily before breakfast. 03/16/21  Yes Mahala Menghini, PA-C  loratadine (CLARITIN) 10 MG tablet Take 10 mg by mouth daily as needed for allergies.   Yes [provider]  MAGNESIUM PO Take 330 mg by mouth daily.   Yes [provider]  metoprolol  succinate (TOPROL-XL) 100 MG 24 hr tablet TAKE 1 TABLET(100 MG) BY MOUTH DAILY Patient taking differently: Take 100 mg by mouth daily. 05/12/21  Yes Susy Frizzle, MD  Omega-3 Fatty Acids (FISH OIL) 1000 MG CAPS Take 1,000 mg by mouth 3 (three) times a week.   Yes [provider]  ondansetron (ZOFRAN) 4 MG tablet TAKE 1 TABLET(4 MG) BY MOUTH EVERY 8 HOURS AS NEEDED FOR NAUSEA OR VOMITING 03/06/21  Yes Mahala Menghini, PA-C  pantoprazole (PROTONIX) 40 MG tablet TAKE 1 TABLET BY MOUTH TWICE DAILY 30 MINUTES BEFORE MEALS Patient taking differently: Take 40 mg by mouth 2 (two) times daily. 06/05/21  Yes Jodi Mourning, Kristen S, PA-C  potassium chloride SA (KLOR-CON) 20 MEQ tablet TAKE 1 TABLET(20 MEQ) BY MOUTH DAILY 12/18/20  Yes Susy Frizzle, MD  rosuvastatin (CRESTOR) 5 MG tablet TAKE 1 TABLET(5 MG) BY MOUTH DAILY Patient taking differently: Take 5 mg by mouth daily. 09/17/20  Yes Susy Frizzle, MD  sodium chloride (OCEAN) 0.65 % SOLN nasal spray Place 1 spray into both nostrils as needed for congestion.   Yes [provider]  triamcinolone (NASACORT) 55 MCG/ACT AERO nasal inhaler Place 1 spray into the nose daily as needed (allergies).   Yes [provider]  triamterene-hydrochlorothiazide (MAXZIDE) 75-50 MG tablet Take 1 tablet by mouth daily. 05/18/21  Yes Susy Frizzle, MD  valsartan (DIOVAN) 320 MG tablet TAKE 1 TABLET BY MOUTH EVERY DAY 04/22/21  Yes Susy Frizzle, MD  vitamin E 400 UNIT capsule Take 400 Units by mouth 3 (three) times a week.   Yes [provider]  zinc gluconate 50 MG tablet Take 50 mg by mouth daily.   Yes [provider]  cholecalciferol (VITAMIN D3) 25 MCG (1000 UT) tablet Take 2,000 Units by mouth daily.    [provider]  HYDROcodone bit-homatropine (HYCODAN) 5-1.5 MG/5ML syrup Take 5 mLs by mouth every 8 (eight) hours as needed for cough. Patient not taking: Reported on 07/08/2021 06/26/21   Susy Frizzle, MD   ipratropium (ATROVENT) 0.03 % nasal spray INSTILL 2 SPRAYS IN [facility] NOSTRIL EVERY 12 HOURS Patient not taking: Reported on 07/08/2021 04/17/21   Susy Frizzle, MD  polyethylene glycol-electrolytes (NULYTELY) 420 g solution As directed Patient not taking: Reported on 07/08/2021 03/12/21   Daneil Dolin, MD      Allergies    Ciprofloxacin, Latex, and Vitamin c [bioflavonoid products]    Review of Systems   Review of Systems  Constitutional:  Negative for diaphoresis and fever.  HENT: Negative.    Eyes: Negative.   Respiratory:  Negative for chest tightness and shortness of breath.   Cardiovascular:  Positive for chest pain. Negative for palpitations and leg swelling.  Gastrointestinal:  Negative for abdominal pain, nausea and vomiting.  Genitourinary: Negative.   Musculoskeletal:  Positive for back pain. Negative for arthralgias, joint swelling and neck pain.  Skin: Negative.  Negative for rash  and wound.  Neurological:  Positive for headaches. Negative for dizziness, weakness, light-headedness and numbness.  Psychiatric/Behavioral: Negative.     Physical Exam Updated Vital Signs BP (!) 138/59    Pulse 61    Temp 97.9 F (36.6 C) (Oral)    Resp 20    Wt 78 kg    SpO2 99%    BMI 27.76 kg/m  Physical Exam Vitals and nursing note reviewed.  Constitutional:      Appearance: She is well-developed.  HENT:     Head: Normocephalic and atraumatic.  Eyes:     Conjunctiva/sclera: Conjunctivae normal.  Cardiovascular:     Rate and Rhythm: Normal rate and regular rhythm.     Heart sounds: Normal heart sounds.  Pulmonary:     Effort: Pulmonary effort is normal.     Breath sounds: Normal breath sounds. No wheezing.  Abdominal:     General: Bowel sounds are normal.     Palpations: Abdomen is soft.     Tenderness: There is no abdominal tenderness.  Musculoskeletal:        General: Normal range of motion.     Cervical back: Normal range of motion.     Right lower leg: No tenderness. No  edema.     Left lower leg: No tenderness. No edema.  Skin:    General: Skin is warm and dry.  Neurological:     General: No focal deficit present.     Mental Status: She is alert.    ED Results / Procedures / Treatments   Labs (all labs ordered are listed, but only abnormal results are displayed) Labs Reviewed  BASIC METABOLIC PANEL  CBC  TROPONIN I (HIGH SENSITIVITY)  TROPONIN I (HIGH SENSITIVITY)    EKG EKG Interpretation  Date/Time:  Wednesday July 08 2021 15:04:40 EST Ventricular Rate:  72 PR Interval:  166 QRS Duration: 82 QT Interval:  390 QTC Calculation: 427 R Axis:   29 Text Interpretation: Normal sinus rhythm Normal ECG When compared with ECG of 18-Aug-2020 18:20, No significant change since last tracing Confirmed by Aletta Edouard [phone]) on 07/08/2021 3:08:01 PM  Radiology DG Chest Portable 1 View  Result Date: 07/08/2021 CLINICAL DATA:  Left-sided chest pain EXAM: PORTABLE CHEST 1 VIEW COMPARISON:  03/19/2021 FINDINGS: The heart size and mediastinal contours are within normal limits. Both lungs are clear. The visualized skeletal structures are unremarkable. IMPRESSION: No active disease. Electronically Signed   By: Kathreen Devoid M.D.   On: 07/08/2021 15:28    Procedures Procedures    Medications Ordered in ED Medications  hydrALAZINE (APRESOLINE) injection 10 mg (0 mg Intravenous Hold 07/08/21 1654)    ED Course/ Medical Decision Making/ A&P                           Medical Decision Making Patient presented initially with significant hypertension with a BP of 201 /77.  She had associated headache and mid chest pressure and upper back pain which had been intermittent throughout today.  Differential diagnosis including hypertensive emergency versus urgency, unstable angina, pneumonia, arrhythmia.  Reported history of AAA, however recent imaging denies this diagnosis.  Her blood pressure spontaneously improved so was not given the hydralazine that had initially  been ordered.  Amount and/or Complexity of Data Reviewed Labs: ordered.    Details: Labs including a delta troponin have been normal range.  Be met in CBC are normal. Radiology: ordered.    Details: Chest x-ray  negative for pulmonary infection or effusion. ECG/medicine tests: ordered and independent interpretation performed.    Details: Normal sinus rhythm.  Risk Decision regarding hospitalization. Risk Details: No indication for hospitalization today.  She actually does have regularly follow-up with her cardiologist Dr. Johnsie Cancel in 5 days.  She is encouraged to keep this appointment.           Final Clinical Impression(s) / ED Diagnoses Final diagnoses:  Primary hypertension  Nonspecific chest pain    Rx / DC Orders ED Discharge Orders     None         Landis Martins 07/08/21 1834    Hayden Rasmussen, MD 07/09/21 [phone]

## 2021-07-08 NOTE — ED Triage Notes (Signed)
Transient chest pain yesterday, chest heaviness today with nausea and shortness of breath and lightheaded and weak. ?

## 2021-07-08 NOTE — ED Triage Notes (Signed)
Pt reports chest pain radiates through to her back. Pt denies any tearing or ripping sensations in chest. Pt has hx of AAA without repair. ?

## 2021-07-13 ENCOUNTER — Other Ambulatory Visit: Payer: Self-pay

## 2021-07-13 ENCOUNTER — Ambulatory Visit (INDEPENDENT_AMBULATORY_CARE_PROVIDER_SITE_OTHER): Payer: Medicare Other | Admitting: Cardiovascular Disease

## 2021-07-13 ENCOUNTER — Encounter: Payer: Self-pay | Admitting: Cardiovascular Disease

## 2021-07-13 VITALS — BP 146/58 | HR 70 | Ht 66.0 in | Wt 169.0 lb

## 2021-07-13 DIAGNOSIS — I6523 Occlusion and stenosis of bilateral carotid arteries: Secondary | ICD-10-CM

## 2021-07-13 DIAGNOSIS — I10 Essential (primary) hypertension: Secondary | ICD-10-CM

## 2021-07-13 DIAGNOSIS — R079 Chest pain, unspecified: Secondary | ICD-10-CM | POA: Diagnosis not present

## 2021-07-13 DIAGNOSIS — I739 Peripheral vascular disease, unspecified: Secondary | ICD-10-CM | POA: Diagnosis not present

## 2021-07-13 DIAGNOSIS — E782 Mixed hyperlipidemia: Secondary | ICD-10-CM | POA: Diagnosis not present

## 2021-07-13 NOTE — Patient Instructions (Addendum)
Follow-Up: ?Follow up with Dr. Donzetta Matters- [facility] ?Follow up with Dr. Johnsie Cancel in 1 year. ?Any Other Special Instructions Will Be Listed Below (If Applicable). ? ? ? ? ?If you need a refill on your cardiac medications before your next appointment, please call your pharmacy. ? ?

## 2021-07-17 DIAGNOSIS — H33312 Horseshoe tear of retina without detachment, left eye: Secondary | ICD-10-CM | POA: Diagnosis not present

## 2021-07-17 DIAGNOSIS — H43813 Vitreous degeneration, bilateral: Secondary | ICD-10-CM | POA: Diagnosis not present

## 2021-07-27 ENCOUNTER — Telehealth: Payer: Self-pay | Admitting: Pharmacist

## 2021-07-27 NOTE — Progress Notes (Signed)
? ? ?Chronic Care Management ?Pharmacy Assistant  ? ? ?Name: [name]  MRN: [id number] DOB: 1956/02/10 ? ? ? ?Reason for Encounter: Disease State - General Adherence Call  ?  ? ? ?Recent office visits:  ?06/26/21 [name], MD - Family Medicine - Viral URI - HYDROcodone bit-homatropine (HYCODAN) 5-1.5 MG/5ML syrup prescribed. Follow up if needed. ? ?Recent consult visits:  ?07/13/21 Jenkins Rouge MD - Cardiology - PAD - No medication changes. Follow up in 1 year.  ? ?Hospital visits: 07/08/21 - 07/09/21 ?Medication Reconciliation was completed by comparing discharge summary, patient?s EMR and Pharmacy list, and upon discussion with patient. ? ?Admitted to the hospital on 07/08/21 due to Hypertension. Discharge date was 07/09/21. Discharged from [facility]   ? ?New?Medications Started at [facility] Discharge:?? ?None noted.  ? ?Medication Changes at Hospital Discharge: ?None noted. ? ?Medications Discontinued at Hospital Discharge: ?None noted.  ? ?Medications that remain the same after Hospital Discharge:??  ?All other medications will remain the same.   ? ? ?Medications: ?Outpatient Encounter Medications as of 07/27/2021  ?Medication Sig  ? acetaminophen (TYLENOL) 500 MG tablet Take 500 mg by mouth every 6 (six) hours as needed for moderate pain or headache.  ? ALPRAZolam (XANAX) 0.5 MG tablet TAKE 1 TABLET(0.5 MG) BY MOUTH TWICE DAILY AS NEEDED FOR ANXIETY (Patient taking differently: Take 0.5 mg by mouth 2 (two) times daily as needed for anxiety.)  ? amLODipine (NORVASC) 10 MG tablet TAKE 1 TABLET(10 MG) BY MOUTH AT BEDTIME (Patient taking differently: Take 10 mg by mouth at bedtime.)  ? aspirin EC 81 MG tablet Take 81 mg by mouth at bedtime.   ? Calcium 500 MG tablet Take 500 mg by mouth in the morning and at bedtime.  ? Carboxymethylcellul-Glycerin (CLEAR EYES FOR DRY EYES) 1-0.25 % SOLN Place 1 drop into both eyes daily as needed (dry eyes).  ? cholecalciferol (VITAMIN D3) 25 MCG (1000 UT) tablet  Take 2,000 Units by mouth daily.  ? cloNIDine (CATAPRES) 0.1 MG tablet Take 0.1 mg by mouth See admin instructions. Take 1/2 tablet daily as needed for blood pressure  ? Coenzyme Q10 (COQ10) 100 MG CAPS Take 100 mg by mouth daily.  ? CRANBERRY PO Take 1 each by mouth 2 (two) times a week. Chewables  ? Cyanocobalamin (B-12) 2500 MCG TABS Take 2,500 mcg by mouth daily.  ? EPINEPHrine 0.3 mg/0.3 mL IJ SOAJ injection Inject 0.3 mg into the muscle as needed for anaphylaxis.   ? gentamicin ointment (GARAMYCIN) 0.1 % Apply 1 application topically daily as needed (nasal bleeds).  ? HYDROcodone bit-homatropine (HYCODAN) 5-1.5 MG/5ML syrup Take 5 mLs by mouth every 8 (eight) hours as needed for cough. (Patient not taking: Reported on 07/08/2021)  ? hydroquinone 4 % cream Apply 1 application topically 2 (two) times daily as needed (dark spots).  ? ipratropium (ATROVENT) 0.03 % nasal spray INSTILL 2 SPRAYS IN EACH NOSTRIL EVERY 12 HOURS (Patient not taking: Reported on 07/08/2021)  ? linaclotide (LINZESS) 290 MCG CAPS capsule Take 1 capsule (290 mcg total) by mouth daily before breakfast.  ? loratadine (CLARITIN) 10 MG tablet Take 10 mg by mouth daily as needed for allergies.  ? MAGNESIUM PO Take 330 mg by mouth daily.  ? metoprolol succinate (TOPROL-XL) 100 MG 24 hr tablet TAKE 1 TABLET(100 MG) BY MOUTH DAILY (Patient taking differently: Take 100 mg by mouth daily.)  ? Omega-3 Fatty Acids (FISH OIL) 1000 MG CAPS Take 1,000 mg by mouth 3 (  three) times a week.  ? ondansetron (ZOFRAN) 4 MG tablet TAKE 1 TABLET(4 MG) BY MOUTH EVERY 8 HOURS AS NEEDED FOR NAUSEA OR VOMITING  ? pantoprazole (PROTONIX) 40 MG tablet TAKE 1 TABLET BY MOUTH TWICE DAILY 30 MINUTES BEFORE MEALS (Patient taking differently: Take 40 mg by mouth 2 (two) times daily.)  ? polyethylene glycol-electrolytes (NULYTELY) 420 g solution As directed (Patient not taking: Reported on 07/08/2021)  ? potassium chloride SA (KLOR-CON) 20 MEQ tablet TAKE 1 TABLET(20 MEQ) BY MOUTH  DAILY  ? rosuvastatin (CRESTOR) 5 MG tablet TAKE 1 TABLET(5 MG) BY MOUTH DAILY (Patient taking differently: Take 5 mg by mouth daily.)  ? sodium chloride (OCEAN) 0.65 % SOLN nasal spray Place 1 spray into both nostrils as needed for congestion.  ? triamcinolone (NASACORT) 55 MCG/ACT AERO nasal inhaler Place 1 spray into the nose daily as needed (allergies).  ? triamterene-hydrochlorothiazide (MAXZIDE) 75-50 MG tablet Take 1 tablet by mouth daily.  ? valsartan (DIOVAN) 320 MG tablet TAKE 1 TABLET BY MOUTH EVERY DAY  ? vitamin E 400 UNIT capsule Take 400 Units by mouth 3 (three) times a week.  ? zinc gluconate 50 MG tablet Take 50 mg by mouth daily.  ? ?No facility-administered encounter medications on file as of 07/27/2021.  ? ? ? ?Have you had any problems recently with your health? ?Patient reported she is doing well and her blood pressures have been doing great.  ?  ?Have you had any problems with your pharmacy? ?Patient denied any problems with her current pharmacy.  ?  ?What issues or side effects are you having with your medications? ?Patient denied any issues or side effects from her current medications.  ?  ?What would you like me to pass along to Leata Mouse, CPP for them to help you with?  ?Patient did not have anything to pass along to CPP at this time.  ?  ?What can we do to take care of you better? ?Patient did not have any recommendations at this time. She reported she is satisfied with her current level of care.  ?  ?  ?Care Gaps ?  ?AWV: done 02/05/21 ?Colonoscopy: done 04/15/21 ?DM Eye Exam: due 12/11/20 ?DM Foot Exam: due 12/10/18 ?Microalbumin: done 01/20/21  ?HbgAIC: done 01/20/21 (5.9) ?DEXA: done 03/05/21 ?Mammogram: done 12/23/20 ?  ?  ?Star Rating Drugs: ?valsartan (DIOVAN) 320 MG tablet - last filled 04/22/21 90 days  ?rosuvastatin (CRESTOR) 5 MG tablet - last filled 06/12/21 90 days  ? ? ?No future appointments. ? ? ?Liza Showfety, CCMA ?Clinical Pharmacist Assistant  ?[phone] ? ?

## 2021-07-29 DIAGNOSIS — H43812 Vitreous degeneration, left eye: Secondary | ICD-10-CM | POA: Diagnosis not present

## 2021-07-29 LAB — HM DIABETES EYE EXAM

## 2021-08-16 ENCOUNTER — Other Ambulatory Visit: Payer: Self-pay | Admitting: Family Medicine

## 2021-08-16 DIAGNOSIS — F419 Anxiety disorder, unspecified: Secondary | ICD-10-CM

## 2021-08-17 NOTE — Telephone Encounter (Signed)
LOV 06/26/21 ?Last refill 06/05/21, #60, 0 refills ? ?Please review, thanks! ? ?

## 2021-08-25 ENCOUNTER — Telehealth: Payer: Self-pay

## 2021-08-25 NOTE — Telephone Encounter (Signed)
Pt called saying she is having pulse sensations in her head and wants to be seen in the office.  Per [place], [place], Bring pt in with a Carotid Duplex.  Pt to be scheduled. ?

## 2021-09-08 ENCOUNTER — Ambulatory Visit (INDEPENDENT_AMBULATORY_CARE_PROVIDER_SITE_OTHER): Payer: Medicare Other | Admitting: Family Medicine

## 2021-09-08 VITALS — BP 140/62 | HR 71 | Temp 98.4°F | Ht 66.0 in | Wt 169.6 lb

## 2021-09-08 DIAGNOSIS — M5416 Radiculopathy, lumbar region: Secondary | ICD-10-CM | POA: Diagnosis not present

## 2021-09-08 MED ORDER — CELECOXIB 200 MG PO CAPS: 200.0000 mg | ORAL_CAPSULE | Freq: Every day | ORAL | 0 refills | Status: DC

## 2021-09-08 NOTE — Progress Notes (Signed)
? ?Subjective:  ? ? Patient ID: [name], female    DOB: 07-19-1955, 66 y.o.   MRN: [id number] ? ?Patient presents today complaining of low back pain.  She been hurting in her lower back ever since went to the emergency room.  She states that she is having pain radiating from her lower back down her right posterior hip all the way down her right leg into her right foot.  It aches and throbs at night.  She reports burning and stinging pain in the right foot particular on the dorsum of the foot.  She states that when she stands and walks, she feels like her right leg is getting give way underneath her and not be able to support her body weight.  She has been trying Tylenol with no relief.  She cannot tolerate ibuprofen or Aleve due to a history of esophageal strictures that she believes the medication caused.  She has not tried any NSAIDs.  She denies any bowel or bladder incontinence. ?Past Medical History:  ?Diagnosis Date  ? Abdominal aortic aneurysm (AAA) 3.0 cm to 5.0 cm in diameter in female   ? None seen on prior imaging?   ? Anxiety   ? Cataract   ? Chronic left shoulder pain   ? Headache   ? Hiatal hernia   ? small  ? Hyperlipidemia   ? Hypertension   ? Pre-diabetes   ? Schatzki's ring   ? Seasonal allergies   ? Tremor   ? ?Past Surgical History:  ?Procedure Laterality Date  ? CARDIAC CATHETERIZATION  2005  ? "Ms. Merida has essentially normal coronary arteries and normal left ventricular function.  She will be treated empirically with anti-reflux measures"  ? CATARACT EXTRACTION, BILATERAL    ? CHOLECYSTECTOMY    ? COLONOSCOPY    06/16/2004  ? CVE:LFYBOF rectum/Diminutive polyps of the splenic flexure and the cecum/The remainder of the colonic mucosa appeared normal pathology (hyperplastic polyps).  ? COLONOSCOPY N/A 08/07/2015  ? Dr. Gala Romney: 6 mm descending colon tubular adenoma, multiple medium-mouthed diverticula in sigmoid colon. surveillance 2022  ? COLONOSCOPY N/A 04/15/2021  ? Procedure:  COLONOSCOPY;  Surgeon: Daneil Dolin, MD;  Location: [facility];  Service: Endoscopy;  Laterality: N/A;  9:30am  ? ESOPHAGOGASTRODUODENOSCOPY  09/11/2001  ? BPZ:WCHENIDPO ring otherwise normal esophagus/Normal stomach/Normal D1 and D2 status post passage of a 56 [place] dilator  ? ESOPHAGOGASTRODUODENOSCOPY N/A 11/11/2017  ? Procedure: ESOPHAGOGASTRODUODENOSCOPY (EGD);  Surgeon: Daneil Dolin, MD; mild Schatzki ring s/p dilation and mild erosive gastropathy.  ? ESOPHAGOGASTRODUODENOSCOPY (EGD) WITH ESOPHAGEAL DILATION N/A 01/25/2013  ? Dr. Gala Romney- schatzki's ring- 62 F dilation. small hiatal hernia  ? HYSTEROSCOPY WITH D & C N/A 03/06/2019  ? Procedure: DILATATION AND CURETTAGE /HYSTEROSCOPY;  Surgeon: Jonnie Kind, MD;  Location: [facility];  Service: Gynecology;  Laterality: N/A;  ? MALONEY DILATION N/A 11/11/2017  ? Procedure: MALONEY DILATION;  Surgeon: Daneil Dolin, MD;  Location: [facility];  Service: Endoscopy;  Laterality: N/A;  ? POLYPECTOMY N/A 03/06/2019  ? Procedure: POLYPECTOMY ( REMOVAL OF ENDOMETRIAL POLYP);  Surgeon: Jonnie Kind, MD;  Location: [facility];  Service: Gynecology;  Laterality: N/A;  ? POLYPECTOMY  04/15/2021  ? Procedure: POLYPECTOMY;  Surgeon: Daneil Dolin, MD;  Location: [facility];  Service: Endoscopy;;  ? TUBAL LIGATION    ? ?Current Outpatient Medications on File Prior to Visit  ?Medication Sig Dispense Refill  ? acetaminophen (TYLENOL) 500 MG tablet  Take 500 mg by mouth every 6 (six) hours as needed for moderate pain or headache.    ? ALPRAZolam (XANAX) 0.5 MG tablet TAKE 1 TABLET(0.5 MG) BY MOUTH TWICE DAILY AS NEEDED FOR ANXIETY 60 tablet 2  ? amLODipine (NORVASC) 10 MG tablet TAKE 1 TABLET(10 MG) BY MOUTH AT BEDTIME (Patient taking differently: Take 10 mg by mouth at bedtime.) 90 tablet 3  ? aspirin EC 81 MG tablet Take 81 mg by mouth at bedtime.     ? Calcium 500 MG tablet Take 500 mg by mouth in the morning and at bedtime.    ? Carboxymethylcellul-Glycerin  (CLEAR EYES FOR DRY EYES) 1-0.25 % SOLN Place 1 drop into both eyes daily as needed (dry eyes).    ? cholecalciferol (VITAMIN D3) 25 MCG (1000 UT) tablet Take 2,000 Units by mouth daily.    ? cloNIDine (CATAPRES) 0.1 MG tablet Take 0.1 mg by mouth See admin instructions. Take 1/2 tablet daily as needed for blood pressure    ? Coenzyme Q10 (COQ10) 100 MG CAPS Take 100 mg by mouth daily.    ? CRANBERRY PO Take 1 each by mouth 2 (two) times a week. Chewables    ? Cyanocobalamin (B-12) 2500 MCG TABS Take 2,500 mcg by mouth daily.    ? EPINEPHrine 0.3 mg/0.3 mL IJ SOAJ injection Inject 0.3 mg into the muscle as needed for anaphylaxis.     ? gentamicin ointment (GARAMYCIN) 0.1 % Apply 1 application topically daily as needed (nasal bleeds).    ? hydroquinone 4 % cream Apply 1 application topically 2 (two) times daily as needed (dark spots).    ? ipratropium (ATROVENT) 0.03 % nasal spray INSTILL 2 SPRAYS IN EACH NOSTRIL EVERY 12 HOURS 30 mL 12  ? linaclotide (LINZESS) 290 MCG CAPS capsule Take 1 capsule (290 mcg total) by mouth daily before breakfast. 90 capsule 3  ? loratadine (CLARITIN) 10 MG tablet Take 10 mg by mouth daily as needed for allergies.    ? MAGNESIUM PO Take 330 mg by mouth daily.    ? metoprolol succinate (TOPROL-XL) 100 MG 24 hr tablet TAKE 1 TABLET(100 MG) BY MOUTH DAILY (Patient taking differently: Take 100 mg by mouth daily.) 90 tablet 3  ? Omega-3 Fatty Acids (FISH OIL) 1000 MG CAPS Take 1,000 mg by mouth 3 (three) times a week.    ? ondansetron (ZOFRAN) 4 MG tablet TAKE 1 TABLET(4 MG) BY MOUTH EVERY 8 HOURS AS NEEDED FOR NAUSEA OR VOMITING 30 tablet 0  ? pantoprazole (PROTONIX) 40 MG tablet TAKE 1 TABLET BY MOUTH TWICE DAILY 30 MINUTES BEFORE MEALS (Patient taking differently: Take 40 mg by mouth 2 (two) times daily.) 60 tablet 5  ? potassium chloride SA (KLOR-CON) 20 MEQ tablet TAKE 1 TABLET(20 MEQ) BY MOUTH DAILY 30 tablet 5  ? rosuvastatin (CRESTOR) 5 MG tablet TAKE 1 TABLET(5 MG) BY MOUTH DAILY  (Patient taking differently: Take 5 mg by mouth daily.) 90 tablet 3  ? sodium chloride (OCEAN) 0.65 % SOLN nasal spray Place 1 spray into both nostrils as needed for congestion.    ? triamcinolone (NASACORT) 55 MCG/ACT AERO nasal inhaler Place 1 spray into the nose daily as needed (allergies).    ? triamterene-hydrochlorothiazide (MAXZIDE) 75-50 MG tablet Take 1 tablet by mouth daily. 90 tablet 3  ? valsartan (DIOVAN) 320 MG tablet TAKE 1 TABLET BY MOUTH EVERY DAY 90 tablet 1  ? vitamin E 400 UNIT capsule Take 400 Units by mouth 3 (three) times  a week.    ? zinc gluconate 50 MG tablet Take 50 mg by mouth daily.    ? ?No current facility-administered medications on file prior to visit.  ? ?Allergies  ?Allergen Reactions  ? Ciprofloxacin Shortness Of Breath  ? Latex Other (See Comments)  ?  Burns skin  ? Vitamin C [Bioflavonoid Products] Itching  ? ?Social History  ? ?Socioeconomic History  ? Marital status: Single  ?  Spouse name: Not on file  ? Number of children: 3  ? Years of education: Not on file  ? Highest education level: Not on file  ?Occupational History  ? Occupation: Charity fundraiser  ?  Employer: [employer]  ?Tobacco Use  ? Smoking status: Former  ?  Packs/day: 0.25  ?  Years: 35.00  ?  Pack years: 8.75  ?  Types: Cigarettes  ?  Quit date: 12/31/2012  ?  Years since quitting: 8.6  ? Smokeless tobacco: Never  ?Vaping Use  ? Vaping Use: Never used  ?Substance and Sexual Activity  ? Alcohol use: Not Currently  ? Drug use: No  ? Sexual activity: Not Currently  ?  Partners: Male  ?  Birth control/protection: Post-menopausal, Surgical  ?  Comment: tubal  ?Other Topics Concern  ? Not on file  ?Social History Narrative  ? Eats all food groups.   ? Wears seatbelt.   ? Has 3 children.  ? Prior smoker.  ? Attends church.   ? Used to work in Charity fundraiser.   ? Drives.  ? Enjoys going out with family.   ? ?Social Determinants of Health  ? ?Financial Resource Strain: Not on file  ?Food Insecurity: Not on file   ?Transportation Needs: Not on file  ?Physical Activity: Not on file  ?Stress: Not on file  ?Social Connections: Not on file  ?Intimate Partner Violence: Not on file  ? ?Family History  ?Problem Relation Age of Onset

## 2021-09-09 ENCOUNTER — Emergency Department (HOSPITAL_COMMUNITY): Payer: Medicare Other

## 2021-09-09 ENCOUNTER — Encounter (HOSPITAL_COMMUNITY): Payer: Self-pay

## 2021-09-09 ENCOUNTER — Emergency Department (HOSPITAL_COMMUNITY)
Admission: EM | Admit: 2021-09-09 | Discharge: 2021-09-09 | Disposition: A | Discharge status: Home / Self Care | Payer: Medicare Other | Attending: Emergency Medicine | Admitting: Emergency Medicine

## 2021-09-09 DIAGNOSIS — I771 Stricture of artery: Secondary | ICD-10-CM | POA: Diagnosis not present

## 2021-09-09 DIAGNOSIS — I6521 Occlusion and stenosis of right carotid artery: Secondary | ICD-10-CM | POA: Diagnosis not present

## 2021-09-09 DIAGNOSIS — I10 Essential (primary) hypertension: Secondary | ICD-10-CM | POA: Diagnosis not present

## 2021-09-09 DIAGNOSIS — Z91040 Latex allergy status: Secondary | ICD-10-CM | POA: Insufficient documentation

## 2021-09-09 DIAGNOSIS — I6523 Occlusion and stenosis of bilateral carotid arteries: Secondary | ICD-10-CM | POA: Diagnosis not present

## 2021-09-09 DIAGNOSIS — Z79899 Other long term (current) drug therapy: Secondary | ICD-10-CM | POA: Insufficient documentation

## 2021-09-09 DIAGNOSIS — I6503 Occlusion and stenosis of bilateral vertebral arteries: Secondary | ICD-10-CM | POA: Diagnosis not present

## 2021-09-09 DIAGNOSIS — R519 Headache, unspecified: Secondary | ICD-10-CM

## 2021-09-09 DIAGNOSIS — Z7982 Long term (current) use of aspirin: Secondary | ICD-10-CM | POA: Diagnosis not present

## 2021-09-09 DIAGNOSIS — I6621 Occlusion and stenosis of right posterior cerebral artery: Secondary | ICD-10-CM | POA: Diagnosis not present

## 2021-09-09 LAB — BASIC METABOLIC PANEL
Anion gap: 8 (ref 5–15)
BUN: 10 mg/dL (ref 8–23)
CO2: 26 mmol/L (ref 22–32)
Calcium: 9.5 mg/dL (ref 8.9–10.3)
Chloride: 106 mmol/L (ref 98–111)
Creatinine, Ser: 0.9 mg/dL (ref 0.44–1.00)
GFR, Estimated: >60 mL/min (ref >60)
Glucose, Bld: 117 mg/dL — ABNORMAL HIGH (ref 70–99)
Potassium: 4.2 mmol/L (ref 3.5–5.1)
Sodium: 140 mmol/L (ref 135–145)

## 2021-09-09 LAB — CBC WITH DIFFERENTIAL/PLATELET
Abs Immature Granulocytes: 0.01 10*3/uL (ref 0.00–0.07)
Basophils Absolute: 0 10*3/uL (ref 0.0–0.1)
Basophils Relative: 0 %
Eosinophils Absolute: 0.2 10*3/uL (ref 0.0–0.5)
Eosinophils Relative: 3 %
HCT: 38.9 % (ref 36.0–46.0)
Hemoglobin: 12.6 g/dL (ref 12.0–15.0)
Immature Granulocytes: 0 %
Lymphocytes Relative: 48 %
Lymphs Abs: 3.8 10*3/uL (ref 0.7–4.0)
MCH: 29.9 pg (ref 26.0–34.0)
MCHC: 32.4 g/dL (ref 30.0–36.0)
MCV: 92.2 fL (ref 80.0–100.0)
Monocytes Absolute: 0.5 10*3/uL (ref 0.1–1.0)
Monocytes Relative: 7 %
Neutro Abs: 3.4 10*3/uL (ref 1.7–7.7)
Neutrophils Relative %: 42 %
Platelets: 270 10*3/uL (ref 150–400)
RBC: 4.22 MIL/uL (ref 3.87–5.11)
RDW: 12.6 % (ref 11.5–15.5)
WBC: 8 10*3/uL (ref 4.0–10.5)
nRBC: 0 % (ref 0.0–0.2)

## 2021-09-09 MED ORDER — IOHEXOL 350 MG/ML SOLN
75.0000 mL | Freq: Once | INTRAVENOUS | Status: AC | PRN
  Administered 2021-09-09: 75 mL via INTRAVENOUS

## 2021-09-09 MED ORDER — SODIUM CHLORIDE 0.9 % IV BOLUS
1000.0000 mL | Freq: Once | INTRAVENOUS | Status: AC
  Administered 2021-09-09: 1000 mL via INTRAVENOUS

## 2021-09-09 MED ORDER — MELOXICAM 15 MG PO TABS: 15.0000 mg | ORAL_TABLET | Freq: Every day | ORAL | 0 refills | Status: DC | PRN

## 2021-09-09 NOTE — ED Provider Notes (Signed)
?[facility] ?Provider Note ? ? ?CSN: [id number] ?Arrival date & time: 09/09/21  0250 ? ?  ? ?History ? ?Chief Complaint  ?Patient presents with  ? Headache  ? ? ?[name] is a 66 y.o. female. ? ?Pt is a 66 yo female with a pmhx significant for htn, hyperlipidemia, and headaches.  Pt said she's been having intermittent headaches for several months.  She takes tylenol and they go away.  Pt did see ENT and was told headaches were not due to sinuses.  She said she feels pulsatile sensations on both sides of her face when she has the headache.  She called her vascular doctors on 4/18 for the same.  Per epic notes, a carotid US was ordered, but it has not yet been scheduled.  Pt said no one called her back about it to schedule.  Pt denies any headache now.  No weakness or trouble speaking or swallowing. ? ? ?  ? ?Home Medications ?Prior to Admission medications   ?Medication Sig Start Date End Date Taking? Authorizing Provider  ?meloxicam (MOBIC) 15 MG tablet Take 1 tablet (15 mg total) by mouth daily as needed for pain (headache). 09/09/21  Yes Isla Pence, MD  ?acetaminophen (TYLENOL) 500 MG tablet Take 500 mg by mouth every 6 (six) hours as needed for moderate pain or headache.    [provider]  ?ALPRAZolam (XANAX) 0.5 MG tablet TAKE 1 TABLET(0.5 MG) BY MOUTH TWICE DAILY AS NEEDED FOR ANXIETY 08/18/21   Susy Frizzle, MD  ?amLODipine (NORVASC) 10 MG tablet TAKE 1 TABLET(10 MG) BY MOUTH AT BEDTIME ?Patient taking differently: Take 10 mg by mouth at bedtime. 09/17/20   Susy Frizzle, MD  ?aspirin EC 81 MG tablet Take 81 mg by mouth at bedtime.     [provider]  ?Calcium 500 MG tablet Take 500 mg by mouth in the morning and at bedtime.    [provider]  ?Carboxymethylcellul-Glycerin (CLEAR EYES FOR DRY EYES) 1-0.25 % SOLN Place 1 drop into both eyes daily as needed (dry eyes).    [provider]  ?celecoxib (CELEBREX) 200 MG  capsule Take 1 capsule (200 mg total) by mouth daily. 09/08/21   Susy Frizzle, MD  ?cholecalciferol (VITAMIN D3) 25 MCG (1000 UT) tablet Take 2,000 Units by mouth daily.    [provider]  ?cloNIDine (CATAPRES) 0.1 MG tablet Take 0.1 mg by mouth See admin instructions. Take 1/2 tablet daily as needed for blood pressure    [provider]  ?Coenzyme Q10 (COQ10) 100 MG CAPS Take 100 mg by mouth daily.    [provider]  ?CRANBERRY PO Take 1 each by mouth 2 (two) times a week. Chewables    [provider]  ?Cyanocobalamin (B-12) 2500 MCG TABS Take 2,500 mcg by mouth daily.    [provider]  ?EPINEPHrine 0.3 mg/0.3 mL IJ SOAJ injection Inject 0.3 mg into the muscle as needed for anaphylaxis.  01/05/19   [provider]  ?gentamicin ointment (GARAMYCIN) 0.1 % Apply 1 application topically daily as needed (nasal bleeds).    [provider]  ?hydroquinone 4 % cream Apply 1 application topically 2 (two) times daily as needed (dark spots). 01/05/21   [provider]  ?ipratropium (ATROVENT) 0.03 % nasal spray INSTILL 2 SPRAYS IN EACH NOSTRIL EVERY 12 HOURS 04/17/21   Susy Frizzle, MD  ?linaclotide Rolan Lipa) 290 MCG CAPS capsule Take 1 capsule (290 mcg  total) by mouth daily before breakfast. 03/16/21   Mahala Menghini, PA-C  ?loratadine (CLARITIN) 10 MG tablet Take 10 mg by mouth daily as needed for allergies.    [provider]  ?MAGNESIUM PO Take 330 mg by mouth daily.    [provider]  ?metoprolol succinate (TOPROL-XL) 100 MG 24 hr tablet TAKE 1 TABLET(100 MG) BY MOUTH DAILY ?Patient taking differently: Take 100 mg by mouth daily. 05/12/21   Susy Frizzle, MD  ?Omega-3 Fatty Acids (FISH OIL) 1000 MG CAPS Take 1,000 mg by mouth 3 (three) times a week.    [provider]  ?ondansetron (ZOFRAN) 4 MG tablet TAKE 1 TABLET(4 MG) BY MOUTH EVERY 8 HOURS AS NEEDED FOR NAUSEA OR VOMITING 03/06/21   Mahala Menghini, PA-C   ?pantoprazole (PROTONIX) 40 MG tablet TAKE 1 TABLET BY MOUTH TWICE DAILY 30 MINUTES BEFORE MEALS ?Patient taking differently: Take 40 mg by mouth 2 (two) times daily. 06/05/21   Erenest Rasher, PA-C  ?potassium chloride SA (KLOR-CON) 20 MEQ tablet TAKE 1 TABLET(20 MEQ) BY MOUTH DAILY 12/18/20   Susy Frizzle, MD  ?rosuvastatin (CRESTOR) 5 MG tablet TAKE 1 TABLET(5 MG) BY MOUTH DAILY ?Patient taking differently: Take 5 mg by mouth daily. 09/17/20   Susy Frizzle, MD  ?sodium chloride (OCEAN) 0.65 % SOLN nasal spray Place 1 spray into both nostrils as needed for congestion.    [provider]  ?triamcinolone (NASACORT) 55 MCG/ACT AERO nasal inhaler Place 1 spray into the nose daily as needed (allergies).    [provider]  ?triamterene-hydrochlorothiazide (MAXZIDE) 75-50 MG tablet Take 1 tablet by mouth daily. 05/18/21   Susy Frizzle, MD  ?valsartan (DIOVAN) 320 MG tablet TAKE 1 TABLET BY MOUTH EVERY DAY 04/22/21   Susy Frizzle, MD  ?vitamin E 400 UNIT capsule Take 400 Units by mouth 3 (three) times a week.    [provider]  ?zinc gluconate 50 MG tablet Take 50 mg by mouth daily.    [provider]  ?   ? ?Allergies    ?Ciprofloxacin, Latex, and Vitamin c [bioflavonoid products]   ? ?Review of Systems   ?Review of Systems  ?Neurological:  Positive for headaches.  ?All other systems reviewed and are negative. ? ?Physical Exam ?Updated Vital Signs ?BP 122/71   Pulse (!) 51   Temp 98.2 ?F (36.8 ?C) (Oral)   Resp 14   Ht '5\' 6"'$  (1.676 m)   Wt 76.7 kg   SpO2 98%   BMI 27.28 kg/m?  ?Physical Exam ?Vitals and nursing note reviewed.  ?Constitutional:   ?   Appearance: She is well-developed.  ?HENT:  ?   Head: Normocephalic and atraumatic.  ?   Mouth/Throat:  ?   Mouth: Mucous membranes are moist.  ?   Pharynx: Oropharynx is clear.  ?Eyes:  ?   Extraocular Movements: Extraocular movements intact.  ?   Pupils: Pupils are equal, round, and reactive to light.   ?Cardiovascular:  ?   Rate and Rhythm: Normal rate and regular rhythm.  ?   Heart sounds: Normal heart sounds.  ?Pulmonary:  ?   Effort: Pulmonary effort is normal.  ?   Breath sounds: Normal breath sounds.  ?Abdominal:  ?   General: Bowel sounds are normal.  ?   Palpations: Abdomen is soft.  ?Musculoskeletal:     ?   General: Normal range of motion.  ?   Cervical back: Normal range of motion  and neck supple.  ?Skin: ?   General: Skin is warm and dry.  ?Neurological:  ?   Mental Status: She is alert and oriented to person, place, and time.  ?Psychiatric:     ?   Mood and Affect: Mood normal.     ?   Speech: Speech normal.     ?   Behavior: Behavior normal.  ? ? ?ED Results / Procedures / Treatments   ?Labs ?(all labs ordered are listed, but only abnormal results are displayed) ?Labs Reviewed  ?BASIC METABOLIC PANEL - Abnormal; Notable for the following components:  ?    Result Value  ? Glucose, Bld 117 (*)   ? All other components within normal limits  ?CBC WITH DIFFERENTIAL/PLATELET  ? ? ?EKG ?None ? ?Radiology ?CT ANGIO HEAD NECK W WO CM ? ?Result Date: 09/09/2021 ?CLINICAL DATA:  Headache, sudden and severe EXAM: CT ANGIOGRAPHY HEAD AND NECK TECHNIQUE: Multidetector CT imaging of the head and neck was performed using the standard protocol during bolus administration of intravenous contrast. Multiplanar CT image reconstructions and MIPs were obtained to evaluate the vascular anatomy. Carotid stenosis measurements (when applicable) are obtained utilizing NASCET criteria, using the distal internal carotid diameter as the denominator. RADIATION DOSE REDUCTION: This exam was performed according to the departmental dose-optimization program which includes automated exposure control, adjustment of the mA and/or kV according to patient size and/or use of iterative reconstruction technique. CONTRAST:  56m OMNIPAQUE IOHEXOL 350 MG/ML SOLN COMPARISON:  Head CT from earlier the same day FINDINGS: CTA NECK FINDINGS Aortic  arch: Atheromatous plaque. Limited coverage showing no acute finding. Two vessel arch branching pattern. Right carotid system: Atheromatous disease with bulky plaque at the bifurcation and proximal ICA. Distal common carot

## 2021-09-09 NOTE — ED Provider Triage Note (Signed)
Emergency Medicine Provider Triage Evaluation Note ? ?[name] , a 66 y.o. female  was evaluated in triage.  Pt complains of headache.  This has been ongoing for about 6 months intermittently.  This is not associated with visual change, weakness, facial droop, or other strokelike symptoms.  She states it does wake her up from sleep.  It woke her up from sleep tonight.  She states she saw an ENT regarding her symptoms and was told it was not related to her sinuses.  She was planning to follow-up with her vascular specialist regarding this.  But has not followed up yet.  She typically takes Tylenol for the headache. ? ?Review of Systems  ?Positive: As above ?Negative: As above ? ?Physical Exam  ?BP (!) 162/80 (BP Location: Left Arm)   Pulse 67   Temp 98.2 ?F (36.8 ?C) (Oral)   Resp 16   Ht '5\' 6"'$  (1.676 m)   Wt 76.7 kg   SpO2 99%   BMI 27.28 kg/m?  ?Gen:   Awake, no distress   ?Resp:  Normal effort  ?MSK:   Moves extremities without difficulty  ?Other:  Sensation intact.  Pupils equal round and reactive to light.  Strength 5/5 in extensor and flexor muscle groups of upper and lower extremities.  Cranial nerves III through XII intact. ? ?Medical Decision Making  ?Medically screening exam initiated at 3:10 AM.  Appropriate orders placed.  [name] was informed that the remainder of the evaluation will be completed by another provider, this initial triage assessment does not replace that evaluation, and the importance of remaining in the ED until their evaluation is complete. ? ? ?  ?Evlyn Courier, PA-C ?09/09/21 7510 ? ?

## 2021-09-09 NOTE — ED Triage Notes (Signed)
Pt endorses ongoing headache "on and off" for about six months. Pt states pain wakes her from her sleep 2-3 times per week. Endorses feeling like she sees occasional "lightning strike" in L eye. Pt states she took tylenol last at 1600 yesterday without relief.  ?

## 2021-09-10 ENCOUNTER — Other Ambulatory Visit: Payer: Self-pay | Admitting: Gastroenterology

## 2021-09-10 ENCOUNTER — Other Ambulatory Visit: Payer: Self-pay | Admitting: Family Medicine

## 2021-09-10 NOTE — Telephone Encounter (Signed)
Requested Prescriptions  ?Pending Prescriptions Disp Refills  ?? potassium chloride SA (KLOR-CON M) 20 MEQ tablet [Pharmacy Med Name: POTASSIUM CL 20MEQ ER TABLETS] 30 tablet 5  ?  Sig: TAKE 1 TABLET(20 MEQ) BY MOUTH DAILY.  ?  ? Endocrinology:  Minerals - Potassium Supplementation Passed - 09/10/2021  8:04 AM  ?  ?  Passed - K in normal range and within 360 days  ?  Potassium  ?Date Value Ref Range Status  ?09/09/2021 4.2 3.5 - 5.1 mmol/L Final  ?11/16/2012 4.1 mmol/L Final  ?   ?  ?  Passed - Cr in normal range and within 360 days  ?  Creat  ?Date Value Ref Range Status  ?05/28/2021 1.02 0.50 - 1.05 mg/dL Final  ? ?Creatinine, Ser  ?Date Value Ref Range Status  ?09/09/2021 0.90 0.44 - 1.00 mg/dL Final  ? ?Creatinine, Urine  ?Date Value Ref Range Status  ?01/20/2021 47 20 - 275 mg/dL Final  ?   ?  ?  Passed - Valid encounter within last 12 months  ?  Recent Outpatient Visits   ?      ? 2 days ago Lumbar radiculopathy, chronic  ? [facility] Pickard, Cammie Mcgee, MD  ? 2 months ago Viral upper respiratory tract infection  ? [facility] Pickard, Cammie Mcgee, MD  ? 3 months ago Abnormal urine color  ? [facility] Pickard, Cammie Mcgee, MD  ? 5 months ago Chest wall pain  ? [place] Pickard, Cammie Mcgee, MD  ? 7 months ago General medical exam  ? [facility] Susy Frizzle, MD  ?  ?  ? ?  ?  ?  ? ?

## 2021-09-15 ENCOUNTER — Telehealth: Payer: Self-pay

## 2021-09-15 NOTE — Telephone Encounter (Signed)
Spoke with patient and she also reports Celebrex caused her GI upset and heartburn. She would like to know if there is another medication you could prescribe. ? ?Please advise, thanks! ?

## 2021-09-15 NOTE — Telephone Encounter (Signed)
Patient called to report she feels the Celebrex is causing her BP to be elevated. She has been getting readings of ~160/80 at home. She is concerned this is due to the recent addition of this medication.  ? ?Please advise, thanks! ?

## 2021-09-17 NOTE — Telephone Encounter (Signed)
Spoke with patient and advised. She has MRI scheduled tomorrow. Advised her we will call once Dr. Dennard Schaumann has reviewed results. Nothing further needed at this time.  ? ?

## 2021-09-18 ENCOUNTER — Ambulatory Visit
Admission: RE | Admit: 2021-09-18 | Discharge: 2021-09-18 | Disposition: A | Discharge status: Home / Self Care | Payer: Medicare Other | Source: Ambulatory Visit | Attending: Family Medicine | Admitting: Family Medicine

## 2021-09-18 DIAGNOSIS — M545 Low back pain, unspecified: Secondary | ICD-10-CM | POA: Diagnosis not present

## 2021-09-18 DIAGNOSIS — M5416 Radiculopathy, lumbar region: Secondary | ICD-10-CM

## 2021-09-22 ENCOUNTER — Telehealth: Payer: Self-pay

## 2021-09-22 ENCOUNTER — Other Ambulatory Visit: Payer: Self-pay | Admitting: Family Medicine

## 2021-09-22 DIAGNOSIS — I10 Essential (primary) hypertension: Secondary | ICD-10-CM

## 2021-09-22 NOTE — Telephone Encounter (Signed)
Left message for pt to return call.

## 2021-09-22 NOTE — Telephone Encounter (Signed)
Pt called in to office to ask if she could get a referral to a headache specialist.Pt recently had a ov with pcp this month and stated that she briefly discussed getting a referral. Please advise. ? ?Cb#: [phone] ?

## 2021-09-23 ENCOUNTER — Other Ambulatory Visit: Payer: Self-pay | Admitting: Family Medicine

## 2021-09-23 NOTE — Telephone Encounter (Signed)
Requested medication (s) are due for refill today - expired Rx ? ?Requested medication (s) are on the active medication list -yes ? ?Future visit scheduled -no ? ?Last refill: 09/17/20 #90 3RF ? ?Notes to clinic: Expired Rx ? ?Requested Prescriptions  ?Pending Prescriptions Disp Refills  ? amLODipine (NORVASC) 10 MG tablet [Pharmacy Med Name: AMLODIPINE BESYLATE '10MG'$  TABLETS] 90 tablet 3  ?  Sig: TAKE 1 TABLET(10 MG) BY MOUTH AT BEDTIME  ?  ? Cardiovascular: Calcium Channel Blockers 2 Failed - 09/22/2021 10:05 AM  ?  ?  Failed - Last BP in normal range  ?  BP Readings from Last 1 Encounters:  ?09/09/21 (!) 149/64  ?   ?  ?  Passed - Last Heart Rate in normal range  ?  Pulse Readings from Last 1 Encounters:  ?09/09/21 (!) 50  ?   ?  ?  Passed - Valid encounter within last 6 months  ?  Recent Outpatient Visits   ? ?      ? 2 weeks ago Lumbar radiculopathy, chronic  ? [facility] Pickard, Cammie Mcgee, MD  ? 2 months ago Viral upper respiratory tract infection  ? [facility] Pickard, Cammie Mcgee, MD  ? 4 months ago Abnormal urine color  ? [facility] Pickard, Cammie Mcgee, MD  ? 6 months ago Chest wall pain  ? [place] Pickard, Cammie Mcgee, MD  ? 7 months ago General medical exam  ? [facility] Susy Frizzle, MD  ? ?  ?  ? ? ?  ?  ?  ? ? ? ?Requested Prescriptions  ?Pending Prescriptions Disp Refills  ? amLODipine (NORVASC) 10 MG tablet [Pharmacy Med Name: AMLODIPINE BESYLATE '10MG'$  TABLETS] 90 tablet 3  ?  Sig: TAKE 1 TABLET(10 MG) BY MOUTH AT BEDTIME  ?  ? Cardiovascular: Calcium Channel Blockers 2 Failed - 09/22/2021 10:05 AM  ?  ?  Failed - Last BP in normal range  ?  BP Readings from Last 1 Encounters:  ?09/09/21 (!) 149/64  ?   ?  ?  Passed - Last Heart Rate in normal range  ?  Pulse Readings from Last 1 Encounters:  ?09/09/21 (!) 50  ?   ?  ?  Passed - Valid encounter within last 6 months  ?  Recent Outpatient Visits   ? ?      ? 2 weeks  ago Lumbar radiculopathy, chronic  ? [facility] Pickard, Cammie Mcgee, MD  ? 2 months ago Viral upper respiratory tract infection  ? [facility] Pickard, Cammie Mcgee, MD  ? 4 months ago Abnormal urine color  ? [facility] Pickard, Cammie Mcgee, MD  ? 6 months ago Chest wall pain  ? [place] Pickard, Cammie Mcgee, MD  ? 7 months ago General medical exam  ? [facility] Susy Frizzle, MD  ? ?  ?  ? ? ?  ?  ?  ? ? ? ?

## 2021-09-23 NOTE — Telephone Encounter (Signed)
Requested medication (s) are due for refill today: Yes ? ?Requested medication (s) are on the active medication list: Yes ? ?Last refill:  09/17/20 ? ?Future visit scheduled: No ? ?Notes to clinic:  Prescription expired. ? ? ? ?Requested Prescriptions  ?Pending Prescriptions Disp Refills  ? rosuvastatin (CRESTOR) 5 MG tablet [Pharmacy Med Name: ROSUVASTATIN '5MG'$  TABLETS] 90 tablet 3  ?  Sig: TAKE 1 TABLET(5 MG) BY MOUTH DAILY  ?  ? Cardiovascular:  Antilipid - Statins 2 Failed - 09/23/2021  8:04 AM  ?  ?  Failed - Lipid Panel in normal range within the last 12 months  ?  Cholesterol  ?Date Value Ref Range Status  ?01/20/2021 163 <200 mg/dL Final  ? ?LDL Cholesterol (Calc)  ?Date Value Ref Range Status  ?01/20/2021 89 mg/dL (calc) Final  ?  Comment:  ?  Reference range: <100 ?Marland Kitchen ?Desirable range <100 mg/dL for primary prevention;   ?<70 mg/dL for patients with CHD or diabetic patients  ?with > or = 2 CHD risk factors. ?. ?LDL-C is now calculated using the Martin-Hopkins  ?calculation, which is a validated novel method providing  ?better accuracy than the Friedewald equation in the  ?estimation of LDL-C.  ?Cresenciano Genre et al. Annamaria Helling. 6160;737(10): 2061-2068  ?(http://education.[employer].com/faq/FAQ164) ?  ? ?HDL  ?Date Value Ref Range Status  ?01/20/2021 60 > OR = 50 mg/dL Final  ? ?Triglycerides  ?Date Value Ref Range Status  ?01/20/2021 62 <150 mg/dL Final  ? ?  ?  ?  Passed - Cr in normal range and within 360 days  ?  Creat  ?Date Value Ref Range Status  ?05/28/2021 1.02 0.50 - 1.05 mg/dL Final  ? ?Creatinine, Ser  ?Date Value Ref Range Status  ?09/09/2021 0.90 0.44 - 1.00 mg/dL Final  ? ?Creatinine, Urine  ?Date Value Ref Range Status  ?01/20/2021 47 20 - 275 mg/dL Final  ?   ?  ?  Passed - Patient is not pregnant  ?  ?  Passed - Valid encounter within last 12 months  ?  Recent Outpatient Visits   ? ?      ? 2 weeks ago Lumbar radiculopathy, chronic  ? [facility] Pickard, Cammie Mcgee, MD  ? 2 months  ago Viral upper respiratory tract infection  ? [facility] Pickard, Cammie Mcgee, MD  ? 4 months ago Abnormal urine color  ? [facility] Pickard, Cammie Mcgee, MD  ? 6 months ago Chest wall pain  ? [place] Pickard, Cammie Mcgee, MD  ? 7 months ago General medical exam  ? [facility] Susy Frizzle, MD  ? ?  ?  ? ? ?  ?  ?  ? ?

## 2021-09-29 ENCOUNTER — Ambulatory Visit (INDEPENDENT_AMBULATORY_CARE_PROVIDER_SITE_OTHER): Payer: Medicare Other | Admitting: Family Medicine

## 2021-09-29 VITALS — BP 170/82 | HR 60 | Temp 98.1°F | Ht 66.0 in | Wt 167.2 lb

## 2021-09-29 DIAGNOSIS — R519 Headache, unspecified: Secondary | ICD-10-CM

## 2021-09-29 MED ORDER — GABAPENTIN 300 MG PO CAPS: 300.0000 mg | ORAL_CAPSULE | Freq: Two times a day (BID) | ORAL | 3 refills | Status: DC

## 2021-09-29 NOTE — Progress Notes (Signed)
Subjective:    Patient ID: [name], female    DOB: [birth year], 66 y.o.   MRN: [id number]  Patient has 2 concerns today.  First she recently pulled a tick off of her anterior right shoulder.  She now has a 6 mm hive on her anterior right shoulder.  It is a slightly pink papule that looks consistent with urticarial papule.  It itches.  There is no evidence of secondary cellulitis.  There is no erythema migrans.  She denies any fever chills or body aches or neck stiffness or rash.  Second she reports daily episodic headaches for the last year.  Twice she had to go to the emergency room.  Typically they are unilateral.  Mainly over the left side of her face.  She will describe tightness in her forehead and a sharp pain in her cheeks and behind her left eye.  They sound neuralgiform.  She is requesting a referral to a neurologist.  She states that recently the headache has been daily for the last 2 weeks.  She has not tried any preventative medication Past Medical History:  Diagnosis Date   Abdominal aortic aneurysm (AAA) 3.0 cm to 5.0 cm in diameter in female [facility])    None seen on prior imaging?    Anxiety    Cataract    Chronic left shoulder pain    Headache    Hiatal hernia    small   Hyperlipidemia    Hypertension    Pre-diabetes    Schatzki's ring    Seasonal allergies    Tremor      Past Surgical History:  Procedure Laterality Date   CARDIAC CATHETERIZATION  2005   "Ms. Konkle has essentially normal coronary arteries and normal left ventricular function.  She will be treated empirically with anti-reflux measures"   CATARACT EXTRACTION, BILATERAL     CHOLECYSTECTOMY     COLONOSCOPY    06/16/2004   OFB:PZWCHE rectum/Diminutive polyps of the splenic flexure and the cecum/The remainder of the colonic mucosa appeared normal pathology (hyperplastic polyps).   COLONOSCOPY N/A 08/07/2015   Dr. Gala Romney: 6 mm descending colon tubular adenoma, multiple medium-mouthed diverticula  in sigmoid colon. surveillance 2022   COLONOSCOPY N/A 04/15/2021   Procedure: COLONOSCOPY;  Surgeon: Daneil Dolin, MD;  Location: [facility];  Service: Endoscopy;  Laterality: N/A;  9:30am   ESOPHAGOGASTRODUODENOSCOPY  09/11/2001   NID:[id number] ring otherwise normal esophagus/Normal stomach/Normal D1 and D2 status post passage of a 56 French Maloney dilator   ESOPHAGOGASTRODUODENOSCOPY N/A 11/11/2017   Procedure: ESOPHAGOGASTRODUODENOSCOPY (EGD);  Surgeon: Daneil Dolin, MD; mild Schatzki ring s/p dilation and mild erosive gastropathy.   ESOPHAGOGASTRODUODENOSCOPY (EGD) WITH ESOPHAGEAL DILATION N/A 01/25/2013   Dr. Gala Romney- schatzki's ring- 38 F dilation. small hiatal hernia   HYSTEROSCOPY WITH D & C N/A 03/06/2019   Procedure: DILATATION AND CURETTAGE /HYSTEROSCOPY;  Surgeon: Jonnie Kind, MD;  Location: [facility];  Service: Gynecology;  Laterality: N/A;   MALONEY DILATION N/A 11/11/2017   Procedure: [name] DILATION;  Surgeon: Daneil Dolin, MD;  Location: [facility];  Service: Endoscopy;  Laterality: N/A;   POLYPECTOMY N/A 03/06/2019   Procedure: POLYPECTOMY ( REMOVAL OF ENDOMETRIAL POLYP);  Surgeon: Jonnie Kind, MD;  Location: [facility];  Service: Gynecology;  Laterality: N/A;   POLYPECTOMY  04/15/2021   Procedure: POLYPECTOMY;  Surgeon: Daneil Dolin, MD;  Location: [facility];  Service: Endoscopy;;   TUBAL LIGATION     Current Outpatient  Medications on File Prior to Visit  Medication Sig Dispense Refill   acetaminophen (TYLENOL) 500 MG tablet Take 500 mg by mouth every 6 (six) hours as needed for moderate pain or headache.     ALPRAZolam (XANAX) 0.5 MG tablet TAKE 1 TABLET(0.5 MG) BY MOUTH TWICE DAILY AS NEEDED FOR ANXIETY 60 tablet 2   amLODipine (NORVASC) 10 MG tablet TAKE 1 TABLET(10 MG) BY MOUTH AT BEDTIME 90 tablet 3   aspirin EC 81 MG tablet Take 81 mg by mouth at bedtime.      Calcium 500 MG tablet Take 500 mg by mouth in the morning and at bedtime.      Carboxymethylcellul-Glycerin (CLEAR EYES FOR DRY EYES) 1-0.25 % SOLN Place 1 drop into both eyes daily as needed (dry eyes).     celecoxib (CELEBREX) 200 MG capsule Take 1 capsule (200 mg total) by mouth daily. 30 capsule 0   cholecalciferol (VITAMIN D3) 25 MCG (1000 UT) tablet Take 2,000 Units by mouth daily.     cloNIDine (CATAPRES) 0.1 MG tablet Take 0.1 mg by mouth See admin instructions. Take 1/2 tablet daily as needed for blood pressure     Coenzyme Q10 (COQ10) 100 MG CAPS Take 100 mg by mouth daily.     CRANBERRY PO Take 1 each by mouth 2 (two) times a week. Chewables     Cyanocobalamin (B-12) 2500 MCG TABS Take 2,500 mcg by mouth daily.     EPINEPHrine 0.3 mg/0.3 mL IJ SOAJ injection Inject 0.3 mg into the muscle as needed for anaphylaxis.      gentamicin ointment (GARAMYCIN) 0.1 % Apply 1 application topically daily as needed (nasal bleeds).     hydroquinone 4 % cream Apply 1 application topically 2 (two) times daily as needed (dark spots).     ipratropium (ATROVENT) 0.03 % nasal spray INSTILL 2 SPRAYS IN EACH NOSTRIL EVERY 12 HOURS 30 mL 12   linaclotide (LINZESS) 290 MCG CAPS capsule Take 1 capsule (290 mcg total) by mouth daily before breakfast. 90 capsule 3   loratadine (CLARITIN) 10 MG tablet Take 10 mg by mouth daily as needed for allergies.     MAGNESIUM PO Take 330 mg by mouth daily.     meloxicam (MOBIC) 15 MG tablet Take 1 tablet (15 mg total) by mouth daily as needed for pain (headache). 30 tablet 0   metoprolol succinate (TOPROL-XL) 100 MG 24 hr tablet TAKE 1 TABLET(100 MG) BY MOUTH DAILY (Patient taking differently: Take 100 mg by mouth daily.) 90 tablet 3   Omega-3 Fatty Acids (FISH OIL) 1000 MG CAPS Take 1,000 mg by mouth 3 (three) times a week.     ondansetron (ZOFRAN) 4 MG tablet TAKE 1 TABLET(4 MG) BY MOUTH EVERY 8 HOURS AS NEEDED FOR NAUSEA OR VOMITING 30 tablet 0   pantoprazole (PROTONIX) 40 MG tablet Take '40mg'$  30 minutes before breakfast and can take additional dose  before evening meal if needed. 60 tablet 5   potassium chloride SA (KLOR-CON M) 20 MEQ tablet TAKE 1 TABLET(20 MEQ) BY MOUTH DAILY. 30 tablet 5   rosuvastatin (CRESTOR) 5 MG tablet TAKE 1 TABLET(5 MG) BY MOUTH DAILY 90 tablet 3   sodium chloride (OCEAN) 0.65 % SOLN nasal spray Place 1 spray into both nostrils as needed for congestion.     triamcinolone (NASACORT) 55 MCG/ACT AERO nasal inhaler Place 1 spray into the nose daily as needed (allergies).     triamterene-hydrochlorothiazide (MAXZIDE) 75-50 MG tablet Take 1 tablet by mouth  daily. 90 tablet 3   valsartan (DIOVAN) 320 MG tablet TAKE 1 TABLET BY MOUTH EVERY DAY 90 tablet 1   vitamin E 400 UNIT capsule Take 400 Units by mouth 3 (three) times a week.     zinc gluconate 50 MG tablet Take 50 mg by mouth daily.     No current facility-administered medications on file prior to visit.   Allergies  Allergen Reactions   Ciprofloxacin Shortness Of Breath   Latex Other (See Comments)    Burns skin   Vitamin C [Bioflavonoid Products] Itching   Social History   Socioeconomic History   Marital status: Single    Spouse name: Not on file   Number of children: 3   Years of education: Not on file   Highest education level: Not on file  Occupational History   Occupation: English as a second language teacher: [employer]  Tobacco Use   Smoking status: Former    Packs/day: 0.25    Years: 35.00    Pack years: 8.75    Types: Cigarettes    Quit date: 12/31/2012    Years since quitting: 8.7   Smokeless tobacco: Never  Vaping Use   Vaping Use: Never used  Substance and Sexual Activity   Alcohol use: Not Currently   Drug use: No   Sexual activity: Not Currently    Partners: Male    Birth control/protection: Post-menopausal, Surgical    Comment: tubal  Other Topics Concern   Not on file  Social History Narrative   Eats all food groups.    Wears seatbelt.    Has 3 children.   Prior smoker.   Attends church.    Used to work in [employer].     Drives.   Enjoys going out with family.    Social Determinants of Health   Financial Resource Strain: Not on file  Food Insecurity: Not on file  Transportation Needs: Not on file  Physical Activity: Not on file  Stress: Not on file  Social Connections: Not on file  Intimate Partner Violence: Not on file   Family History  Problem Relation Age of Onset   Diabetes Other    Diabetes Mother    Heart disease Mother    Heart disease Father    Diabetes Sister    Diabetes Brother    Hypertension Brother    Diabetes Daughter    Hypertension Maternal Grandmother    Stroke Maternal Grandfather    Early death Paternal Grandfather    Colon cancer Neg Hx    Liver disease Neg Hx       Review of Systems  Neurological:  Positive for headaches.  All other systems reviewed and are negative.     Objective:   Physical Exam Vitals reviewed.  Constitutional:      Appearance: She is well-developed.  HENT:     Right Ear: Tympanic membrane and ear canal normal.     Left Ear: Tympanic membrane and ear canal normal.     Nose:     Right Sinus: No maxillary sinus tenderness or frontal sinus tenderness.     Left Sinus: No maxillary sinus tenderness or frontal sinus tenderness.  Eyes:     General: No visual field deficit.    Extraocular Movements:     Right eye: Normal extraocular motion and no nystagmus.     Left eye: Normal extraocular motion and no nystagmus.     Conjunctiva/sclera: Conjunctivae normal.     Pupils:  Right eye: Pupil is round and reactive.     Left eye: Pupil is round and reactive.  Cardiovascular:     Rate and Rhythm: Normal rate and regular rhythm.     Heart sounds: Normal heart sounds. No murmur heard.   No friction rub. No gallop.  Pulmonary:     Effort: Pulmonary effort is normal. No respiratory distress.     Breath sounds: Normal breath sounds. No stridor. No wheezing or rales.  Abdominal:     General: Abdomen is flat. Bowel sounds are normal. There is no  distension.     Palpations: Abdomen is soft.     Tenderness: There is no abdominal tenderness. There is no guarding.  Musculoskeletal:     Right hand: Normal. No swelling, deformity, tenderness or bony tenderness. Normal range of motion.     Left hand: Normal. No swelling, deformity, tenderness or bony tenderness. Normal range of motion.     Cervical back: Normal range of motion. No rigidity.  Neurological:     Mental Status: She is alert and oriented to person, place, and time. Mental status is at baseline.     Cranial Nerves: No cranial nerve deficit, dysarthria or facial asymmetry.     Sensory: No sensory deficit.     Motor: Tremor present. No weakness or abnormal muscle tone.     Coordination: Romberg sign negative. Coordination normal.     Gait: Gait normal.          Assessment & Plan:  Unilateral headache I reassured the patient that the tick bite looks like a local allergic reaction similar to a mosquito bite.  No treatment is necessary.  Monitor for any flulike symptoms or erythema migrans.  She has unilateral headaches that are neuralgiform in nature.  This could either be tension headaches or migraine type headaches.  She denies any conjunctival tearing so I do not feel that this is SUNCT.  She has an appointment to see neurology but I would like to try her on gabapentin 300 mg twice daily as a preventative.  Picture is upside down but demonstrates her anterior left shoulder.

## 2021-10-02 ENCOUNTER — Encounter (HOSPITAL_COMMUNITY): Payer: Self-pay | Admitting: Emergency Medicine

## 2021-10-02 ENCOUNTER — Other Ambulatory Visit: Payer: Self-pay

## 2021-10-02 ENCOUNTER — Emergency Department (HOSPITAL_COMMUNITY)
Admission: EM | Admit: 2021-10-02 | Discharge: 2021-10-03 | Disposition: A | Discharge status: Home / Self Care | Payer: Medicare Other | Attending: Emergency Medicine | Admitting: Emergency Medicine

## 2021-10-02 ENCOUNTER — Emergency Department (HOSPITAL_COMMUNITY): Payer: Medicare Other

## 2021-10-02 DIAGNOSIS — I10 Essential (primary) hypertension: Secondary | ICD-10-CM

## 2021-10-02 DIAGNOSIS — R0602 Shortness of breath: Secondary | ICD-10-CM | POA: Diagnosis not present

## 2021-10-02 DIAGNOSIS — Z79899 Other long term (current) drug therapy: Secondary | ICD-10-CM | POA: Insufficient documentation

## 2021-10-02 DIAGNOSIS — G459 Transient cerebral ischemic attack, unspecified: Secondary | ICD-10-CM

## 2021-10-02 DIAGNOSIS — Z7982 Long term (current) use of aspirin: Secondary | ICD-10-CM | POA: Diagnosis not present

## 2021-10-02 DIAGNOSIS — Z91040 Latex allergy status: Secondary | ICD-10-CM | POA: Diagnosis not present

## 2021-10-02 DIAGNOSIS — R531 Weakness: Secondary | ICD-10-CM | POA: Diagnosis not present

## 2021-10-02 DIAGNOSIS — R0789 Other chest pain: Secondary | ICD-10-CM | POA: Diagnosis not present

## 2021-10-02 LAB — CBC
HCT: 40.1 % (ref 36.0–46.0)
Hemoglobin: 13.2 g/dL (ref 12.0–15.0)
MCH: 29.8 pg (ref 26.0–34.0)
MCHC: 32.9 g/dL (ref 30.0–36.0)
MCV: 90.5 fL (ref 80.0–100.0)
Platelets: 247 10*3/uL (ref 150–400)
RBC: 4.43 MIL/uL (ref 3.87–5.11)
RDW: 12.4 % (ref 11.5–15.5)
WBC: 7.5 10*3/uL (ref 4.0–10.5)
nRBC: 0 % (ref 0.0–0.2)

## 2021-10-02 LAB — COMPREHENSIVE METABOLIC PANEL
ALT: 18 U/L (ref 0–44)
AST: 18 U/L (ref 15–41)
Albumin: 4.2 g/dL (ref 3.5–5.0)
Alkaline Phosphatase: 72 U/L (ref 38–126)
Anion gap: 9 (ref 5–15)
BUN: 11 mg/dL (ref 8–23)
CO2: 26 mmol/L (ref 22–32)
Calcium: 9.8 mg/dL (ref 8.9–10.3)
Chloride: 100 mmol/L (ref 98–111)
Creatinine, Ser: 0.88 mg/dL (ref 0.44–1.00)
GFR, Estimated: >60 mL/min (ref >60)
Glucose, Bld: 105 mg/dL — ABNORMAL HIGH (ref 70–99)
Potassium: 4.1 mmol/L (ref 3.5–5.1)
Sodium: 135 mmol/L (ref 135–145)
Total Bilirubin: 0.8 mg/dL (ref 0.3–1.2)
Total Protein: 7.2 g/dL (ref 6.5–8.1)

## 2021-10-02 LAB — TROPONIN I (HIGH SENSITIVITY)
Troponin I (High Sensitivity): 9 ng/L (ref <18)
Troponin I (High Sensitivity): 10 ng/L (ref <18)

## 2021-10-02 MED ORDER — ACETAMINOPHEN 500 MG PO TABS
1000.0000 mg | ORAL_TABLET | Freq: Once | ORAL | Status: AC
  Administered 2021-10-02: 1000 mg via ORAL
  Filled 2021-10-02: qty 2

## 2021-10-02 NOTE — Consult Note (Signed)
Neurology Consultation Reason for Consult: Headache and left hand numbness and weakness Requesting Physician: Sherwood Gambler  CC: Headache  History is obtained from: Patient and chart review  HPI: [name] is a 66 y.o. female with a past medical history significant for essential tremor on primidone, hypertension, hyperlipidemia, anxiety, abdominal aortic aneurysm, prediabetes.  She reports that when her blood pressure is very elevated she has headaches, her words get "jumbled", she will have chest pain, and shortness of breath, and will often feel left hand heaviness at these times.  Her blood pressure has been frequently uncontrolled in the last month.  She has some floaters in her right eye but otherwise denies any visual complaints.  She has chronic tingling in her bilateral feet but otherwise denies any new numbness.  She has stable 3 pillow orthopnea denies any other cardiac complaints outside of when she is in hypertensive emergency.  She reports the headache is not positional, is not associated with nausea or vomiting, light or sound sensitivity.  She will sometimes get a headache when she is not having high blood pressure.  The headache is not positional.  She reports she never has left hand heaviness or numbness outside of when her blood pressure is out of control  ROS: All other review of systems was negative except as noted in the HPI.  Past Medical History:  Diagnosis Date   Abdominal aortic aneurysm (AAA) 3.0 cm to 5.0 cm in diameter in female [facility])    None seen on prior imaging?    Anxiety    Cataract    Chronic left shoulder pain    Headache    Hiatal hernia    small   Hyperlipidemia    Hypertension    Pre-diabetes    Schatzki's ring    Seasonal allergies    Tremor    Past Surgical History:  Procedure Laterality Date   CARDIAC CATHETERIZATION  2005   "Ms. Civello has essentially normal coronary arteries and normal left ventricular function.  She will be  treated empirically with anti-reflux measures"   CATARACT EXTRACTION, BILATERAL     CHOLECYSTECTOMY     COLONOSCOPY    06/16/2004   TOI:ZTIWPY rectum/Diminutive polyps of the splenic flexure and the cecum/The remainder of the colonic mucosa appeared normal pathology (hyperplastic polyps).   COLONOSCOPY N/A 08/07/2015   Dr. Gala Romney: 6 mm descending colon tubular adenoma, multiple medium-mouthed diverticula in sigmoid colon. surveillance 2022   COLONOSCOPY N/A 04/15/2021   Procedure: COLONOSCOPY;  Surgeon: Daneil Dolin, MD;  Location: [facility];  Service: Endoscopy;  Laterality: N/A;  9:30am   ESOPHAGOGASTRODUODENOSCOPY  09/11/2001   KDX:[id number] ring otherwise normal esophagus/Normal stomach/Normal D1 and D2 status post passage of a 56 French Maloney dilator   ESOPHAGOGASTRODUODENOSCOPY N/A 11/11/2017   Procedure: ESOPHAGOGASTRODUODENOSCOPY (EGD);  Surgeon: Daneil Dolin, MD; mild Schatzki ring s/p dilation and mild erosive gastropathy.   ESOPHAGOGASTRODUODENOSCOPY (EGD) WITH ESOPHAGEAL DILATION N/A 01/25/2013   Dr. Gala Romney- schatzki's ring- 50 F dilation. small hiatal hernia   HYSTEROSCOPY WITH D & C N/A 03/06/2019   Procedure: DILATATION AND CURETTAGE /HYSTEROSCOPY;  Surgeon: Jonnie Kind, MD;  Location: [facility] OR[facility];  Service: Gynecology;  Laterality: N/A;   MALONEY DILATION N/A 11/11/2017   Procedure: [name] DILATION;  Surgeon: Daneil Dolin, MD;  Location: [facility];  Service: Endoscopy;  Laterality: N/A;   POLYPECTOMY N/A 03/06/2019   Procedure: POLYPECTOMY ( REMOVAL OF ENDOMETRIAL POLYP);  Surgeon: Jonnie Kind, MD;  Location:  [facility];  Service: Gynecology;  Laterality: N/A;   POLYPECTOMY  04/15/2021   Procedure: POLYPECTOMY;  Surgeon: Daneil Dolin, MD;  Location: [facility];  Service: Endoscopy;;   TUBAL LIGATION     Current Outpatient Medications  Medication Instructions   acetaminophen (TYLENOL) 500 mg, Oral, Every 6 hours PRN   ALPRAZolam (XANAX) 0.5 MG tablet TAKE 1  TABLET(0.5 MG) BY MOUTH TWICE DAILY AS NEEDED FOR ANXIETY   amLODipine (NORVASC) 10 MG tablet TAKE 1 TABLET(10 MG) BY MOUTH AT BEDTIME   aspirin EC 81 mg, Oral, Daily at bedtime   B-12 2,500 mcg, Oral, Daily   Calcium 500 mg, Oral, 2 times daily   Carboxymethylcellul-Glycerin (CLEAR EYES FOR DRY EYES) 1-0.25 % SOLN 1 drop, Both Eyes, Daily PRN   celecoxib (CELEBREX) 200 mg, Oral, Daily   cholecalciferol (VITAMIN D3) 2,000 Units, Oral, Daily   cloNIDine (CATAPRES) 0.1 mg, Oral, See admin instructions, Take 1/2 tablet daily as needed for blood pressure   CoQ10 100 mg, Oral, Daily   CRANBERRY PO 1 each, Oral, 2 times weekly, Chewables   EPINEPHrine (EPI-PEN) 0.3 mg, Intramuscular, As needed   Fish Oil 1,000 mg, Oral, 3 times weekly   gabapentin (NEURONTIN) 300 mg, Oral, 2 times daily   gentamicin ointment (GARAMYCIN) 0.1 % 1 application., Topical, Daily PRN   hydroquinone 4 % cream 1 application., Topical, 2 times daily PRN   ipratropium (ATROVENT) 0.03 % nasal spray INSTILL 2 SPRAYS IN EACH NOSTRIL EVERY 12 HOURS   linaclotide (LINZESS) 290 mcg, Oral, Daily before breakfast   loratadine (CLARITIN) 10 mg, Oral, Daily PRN   MAGNESIUM PO 330 mg, Oral, Daily   meloxicam (MOBIC) 15 mg, Oral, Daily PRN   metoprolol succinate (TOPROL-XL) 100 MG 24 hr tablet TAKE 1 TABLET(100 MG) BY MOUTH DAILY   ondansetron (ZOFRAN) 4 MG tablet TAKE 1 TABLET(4 MG) BY MOUTH EVERY 8 HOURS AS NEEDED FOR NAUSEA OR VOMITING   pantoprazole (PROTONIX) 40 MG tablet Take '40mg'$  30 minutes before breakfast and can take additional dose before evening meal if needed.   potassium chloride SA (KLOR-CON M) 20 MEQ tablet TAKE 1 TABLET(20 MEQ) BY MOUTH DAILY.   rosuvastatin (CRESTOR) 5 MG tablet TAKE 1 TABLET(5 MG) BY MOUTH DAILY   sodium chloride (OCEAN) 0.65 % SOLN nasal spray 1 spray, Each Nare, As needed   triamcinolone (NASACORT) 55 MCG/ACT AERO nasal inhaler 1 spray, Nasal, Daily PRN   triamterene-hydrochlorothiazide (MAXZIDE)  75-50 MG tablet 1 tablet, Oral, Daily   valsartan (DIOVAN) 320 MG tablet TAKE 1 TABLET BY MOUTH EVERY DAY   vitamin E 400 Units, Oral, 3 times weekly   zinc gluconate 50 mg, Oral, Daily     Family History  Problem Relation Age of Onset   Diabetes Other    Diabetes Mother    Heart disease Mother    Heart disease Father    Diabetes Sister    Diabetes Brother    Hypertension Brother    Diabetes Daughter    Hypertension Maternal Grandmother    Stroke Maternal Grandfather    Early death Paternal Grandfather    Colon cancer Neg Hx    Liver disease Neg Hx    Social History:  reports that she quit smoking about 8 years ago. Her smoking use included cigarettes. She has a 8.75 pack-year smoking history. She has never used smokeless tobacco. She reports that she does not currently use alcohol. She reports that she does not use drugs.  Exam: Current vital signs: BP (!) 160/69   Pulse (!) 56   Temp 99 F (37.2 C) (Oral)   Resp 11   Ht '5\' 6"'$  (1.676 m)   Wt 76.2 kg   SpO2 99%   BMI 27.12 kg/m  Vital signs in last 24 hours: Temp:  [99 F (37.2 C)] 99 F (37.2 C) (05/26 1704) Pulse Rate:  [56-74] 56 (05/26 2015) Resp:  [11-17] 11 (05/26 2015) BP: (160-182)/(67-75) 160/69 (05/26 2015) SpO2:  [94 %-100 %] 99 % (05/26 2015) Weight:  [76.2 kg] 76.2 kg (05/26 1707)   Physical Exam  Constitutional: Appears well-developed and well-nourished.  Psych: Affect appropriate to situation, calm and cooperative Eyes: No scleral injection HENT: No oropharyngeal obstruction.  MSK: no joint deformities.  Cardiovascular: Normal rate and regular rhythm.  Perfusing extremities well Respiratory: Effort normal, non-labored breathing GI: Soft.  No distension. There is no tenderness.  Skin: Warm dry and intact visible skin  Neuro: Mental Status: Patient is awake, alert, oriented to person, place, month, year, and situation. Patient is able to give a clear and coherent history. No signs of  aphasia or neglect Cranial Nerves: II: Visual Fields are full. Pupils are equal, round, and reactive to light.  Funduscopic exam reveals normal optic discs bilaterally III,IV, VI: EOMI without ptosis or diploplia.  V: Facial sensation is symmetric to temperature VII: Facial movement is symmetric.  VIII: hearing is intact to voice X: Uvula elevates symmetrically XI: Shoulder shrug is symmetric. XII: tongue is midline without atrophy or fasciculations.  Motor: Tone is normal. Bulk is normal. 5/5 strength was present in all four extremities.  Sensory: Sensation is symmetric to light touch and temperature in the arms and legs. Deep Tendon Reflexes: 2+ and symmetric in the brachioradialis and patellae.  Plantars: Toes are downgoing bilaterally.  Cerebellar: FNF and HKS are intact bilaterally Gait:  Deferred in acute setting     I have reviewed the following data pertinent to this consultation:  Lab Results  Component Value Date   CREATININE 0.88 10/02/2021     Lab Results  Component Value Date   HGBA1C 5.9 (H) 01/20/2021   Lab Results  Component Value Date   CHOL 163 01/20/2021   HDL 60 01/20/2021   LDLCALC 89 01/20/2021   TRIG 62 01/20/2021   CHOLHDL 2.7 01/20/2021    CTA 09/09/2021 personally reviewed, agree with radiology: 1. No emergent finding. 2. At least 70% atheromatous narrowing of the right common carotid bifurcation and right cavernous ICA. 50% stenosis of the paraclinoid right ICA. 3. 45% narrowing at the right vertebral origin. 4. Mild intracranial branch atherosclerosis.  Impression: Most likely her symptoms represent hypertensive emergency, which is already resolved by the time of my evaluation.  However given her significant carotid stenosis on the right side, an MRI brain to confirm no acute stroke is appropriate, as an acute stroke could change medical management  Recommendations: - MRI brain w/o contrast to exclude stroke - If this is negative,  patient is appropriate for close outpatient follow-up with her primary care physician for blood pressure control  Lesleigh Noe MD-PhD [facility] [phone] Available 7 PM to 7 AM, outside of these hours please call Neurologist on call as listed on Amion.

## 2021-10-02 NOTE — ED Provider Triage Note (Signed)
Emergency Medicine Provider Triage Evaluation Note  [name] , a 66 y.o. female  was evaluated in triage.  Pt complains of high blood pressure that is not responsive to 4 different medications, chest tightness, shortness of breath with exertion, some left arm pain/weakness with exertion, patient also reports that she has a tick bite on the right shoulder from 4 days ago with a new rash that just started today.  She reports that she has been having some on and off headaches as well as nausea.  She has been seen and evaluated for similar problems by her PCP and was prescribed some medication to help the headaches but it caused her to have burning in the stomach so she stopped taking it and was prescribed gabapentin instead.  Patient denies any chest pain at rest at this time.  Review of Systems  Positive: Chest tightness, tick bite, rash, shob, HTN Negative: Vomiting, diarrhea, fever, chills  Physical Exam  BP (!) 182/67 (BP Location: Right Arm)   Pulse 74   Temp 99 F (37.2 C) (Oral)   Resp 16   Ht '5\' 6"'$  (1.676 m)   Wt 76.2 kg   SpO2 94%   BMI 27.12 kg/m  Gen:   Awake, no distress   Resp:  Normal effort  MSK:   Moves extremities without difficulty  Other:  Tick bite without retained body parts noted right shoulder with localized maculopapular rash around it  Medical Decision Making  Medically screening exam initiated at 5:30 PM.  Appropriate orders placed.  [name] was informed that the remainder of the evaluation will be completed by another provider, this initial triage assessment does not replace that evaluation, and the importance of remaining in the ED until their evaluation is complete.  Workup initiated   [name], [name], [place] 10/02/21 1732

## 2021-10-02 NOTE — ED Notes (Signed)
Received verbal report from Brittany O RN at this time 

## 2021-10-02 NOTE — ED Triage Notes (Signed)
Patient with high blood pressure for about 3 weeks. Patient requesting further evaluation because while checking at home her systolic has been elevated. Patient not currently having headaches but was seen here 3 weeks ago for intermittent headaches. Chest tightness and SHOB with exertion. Pt Aox4.

## 2021-10-02 NOTE — ED Provider Notes (Signed)
Care assumed form Dr. Regenia Skeeter, patient with intermittent left arm weakness, getting MRI. She has had adequate workup for TIA in the recent past, except needs echocardiogram. Will need neurology consult after MRI scan.  MRI scan shows no evidence of stroke.  I have independently viewed the images, and agree with the radiologist's interpretation.  I have discussed blood pressure management with the patient.  She has been taking clonidine 0.05 mg twice a day because her blood pressure has been consistently elevated.  She will need much better blood pressure control.  For now, she is advised to increase her clonidine to 0.1 mg twice a day and to follow-up with her primary care provider.  At that point, we will need to discuss whether echocardiogram is necessary.  She is already taking aspirin daily.  Case has been discussed with Dr. Curly Shores of neurology service who agrees with the above recommendations, no need for hospital admission as TIA evaluation has been almost completely done in the recent past.  Results for orders placed or performed during the hospital encounter of 10/02/21  CBC  Result Value Ref Range   WBC 7.5 4.0 - 10.5 K/uL   RBC 4.43 3.87 - 5.11 MIL/uL   Hemoglobin 13.2 12.0 - 15.0 g/dL   HCT 40.1 36.0 - 46.0 %   MCV 90.5 80.0 - 100.0 fL   MCH 29.8 26.0 - 34.0 pg   MCHC 32.9 30.0 - 36.0 g/dL   RDW 12.4 11.5 - 15.5 %   Platelets 247 150 - 400 K/uL   nRBC 0.0 0.0 - 0.2 %  Comprehensive metabolic panel  Result Value Ref Range   Sodium 135 135 - 145 mmol/L   Potassium 4.1 3.5 - 5.1 mmol/L   Chloride 100 98 - 111 mmol/L   CO2 26 22 - 32 mmol/L   Glucose, Bld 105 (H) 70 - 99 mg/dL   BUN 11 8 - 23 mg/dL   Creatinine, Ser 0.88 0.44 - 1.00 mg/dL   Calcium 9.8 8.9 - 10.3 mg/dL   Total Protein 7.2 6.5 - 8.1 g/dL   Albumin 4.2 3.5 - 5.0 g/dL   AST 18 15 - 41 U/L   ALT 18 0 - 44 U/L   Alkaline Phosphatase 72 38 - 126 U/L   Total Bilirubin 0.8 0.3 - 1.2 mg/dL   GFR, Estimated >60 >60  mL/min   Anion gap 9 5 - 15  Troponin I (High Sensitivity)  Result Value Ref Range   Troponin I (High Sensitivity) 9 <18 ng/L  Troponin I (High Sensitivity)  Result Value Ref Range   Troponin I (High Sensitivity) 10 <18 ng/L   CT ANGIO HEAD NECK W WO CM  Result Date: 09/09/2021 CLINICAL DATA:  Headache, sudden and severe EXAM: CT ANGIOGRAPHY HEAD AND NECK TECHNIQUE: Multidetector CT imaging of the head and neck was performed using the standard protocol during bolus administration of intravenous contrast. Multiplanar CT image reconstructions and MIPs were obtained to evaluate the vascular anatomy. Carotid stenosis measurements (when applicable) are obtained utilizing NASCET criteria, using the distal internal carotid diameter as the denominator. RADIATION DOSE REDUCTION: This exam was performed according to the departmental dose-optimization program which includes automated exposure control, adjustment of the mA and/or kV according to patient size and/or use of iterative reconstruction technique. CONTRAST:  64m OMNIPAQUE IOHEXOL 350 MG/ML SOLN COMPARISON:  Head CT from earlier the same day FINDINGS: CTA NECK FINDINGS Aortic arch: Atheromatous plaque. Limited coverage showing no acute finding. Two vessel arch branching  pattern. Right carotid system: Atheromatous disease with bulky plaque at the bifurcation and proximal ICA. Distal common carotid stenosis measures at least 70%, even when compared to the proximal ICA diameter. Moderate narrowing at the ECA origin. Left carotid system: Atheromatous plaque primarily at the common carotid origin and bifurcation. No flow limiting stenosis or ulceration. Vertebral arteries: Proximal subclavian atherosclerosis without flow reducing stenosis. Proximal left subclavian narrowing from low-density plaque measures 30% on coronal reformats. Atheromatous plaque at the right vertebral origin causing 45% stenosis. No evidence of vertebral dissection or beading. Skeleton:  Ordinary cervical spine degeneration. Other neck: No acute finding. Small gas containing tracheal diverticulum at the right tracheoesophageal groove. Upper chest: Suspect minimal centrilobular emphysema. No acute finding. Review of the MIP images confirms the above findings CTA HEAD FINDINGS Anterior circulation: Atheromatous plaque at the carotid siphons. Right ICA stenosis at the posterior genu measures 70% on sagittal reformats. There is an additional right paraclinoid stenosis of 50% based on axial images. Mild right M1 segment narrowing, atheromatous appearing. Beyond the right cavernous ICA stenosis is poststenotic dilatation which appears bulbous on axial images but no true aneurysm on reformats. No major branch occlusion. Posterior circulation: The vertebral and basilar arteries are smoothly contoured and diffusely patent. Symmetric bilateral PCA flow with no branch occlusion, beading, or aneurysm. Mild right P3 segment stenosis. Venous sinuses: Diffusely patent Anatomic variants: None significant Review of the MIP images confirms the above findings IMPRESSION: 1. No emergent finding. 2. At least 70% atheromatous narrowing of the right common carotid bifurcation and right cavernous ICA. 50% stenosis of the paraclinoid right ICA. 3. 45% narrowing at the right vertebral origin. 4. Mild intracranial branch atherosclerosis. Electronically Signed   By: Jorje Guild M.D.   On: 09/09/2021 13:18   DG Chest 2 View  Result Date: 10/02/2021 CLINICAL DATA:  Chest tightness.  Shortness of breath. EXAM: CHEST - 2 VIEW COMPARISON:  AP chest 07/08/2021 FINDINGS: Cardiac silhouette and mediastinal contours are within normal limits. Moderate calcification within the aortic arch. The lungs are clear. No pleural effusion or pneumothorax. Diffuse decreased bone mineralization. Mild multilevel degenerative disc changes of the thoracic spine. Cholecystectomy clips. IMPRESSION: No active cardiopulmonary disease.  Electronically Signed   By: Yvonne Kendall M.D.   On: 10/02/2021 17:56   CT Head Wo Contrast  Result Date: 09/09/2021 CLINICAL DATA:  Ongoing headaches for 6 months. EXAM: CT HEAD WITHOUT CONTRAST TECHNIQUE: Contiguous axial images were obtained from the base of the skull through the vertex without intravenous contrast. RADIATION DOSE REDUCTION: This exam was performed according to the departmental dose-optimization program which includes automated exposure control, adjustment of the mA and/or kV according to patient size and/or use of iterative reconstruction technique. COMPARISON:  Head CT 07/31/2020 FINDINGS: Brain: No evidence of acute infarction, hemorrhage, hydrocephalus, extra-axial collection or mass lesion/mass effect. There is early cerebral cortical atrophy and mild small-vessel disease of the cerebral white matter. A mildly expanded and empty sella is again noted. There is benign dural calcification in the frontal falx. Vascular: There are patchy calcifications in the carotid siphons, scattered calcification in the distal vertebral arteries. No hyperdense central vessel is seen. Skull: Normal. Negative for fracture or focal lesion. Sinuses/Orbits: There is mild membrane disease in the ethmoid air cells. Other visible sinuses are clear. Fluid is again noted in the inferior left mastoids. The right mastoids and remainder of the left mastoid air cells are clear. Visualized orbital contents are unremarkable. Lens replacements were noted on the prior study  but are not included in the current exam. Other: None. IMPRESSION: 1. No acute intracranial CT findings or interval changes. 2. Early cortical atrophy, with mild small-vessel disease. Vascular calcifications. 3. Stable appearance of expanded and empty sella. Correlate clinically for idiopathic intracranial hypertension. Older CTs have also shown this finding as well, dating back as far as 2016. 4. 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  Electronically Signed   By: Telford Nab M.D.   On: 09/09/2021 04:20   MR BRAIN WO CONTRAST  Result Date: 10/02/2021 CLINICAL DATA:  Left arm pain and weakness, hypertension, stroke suspected EXAM: MRI HEAD WITHOUT CONTRAST TECHNIQUE: Multiplanar, multiecho pulse sequences of the brain and surrounding structures were obtained without intravenous contrast. COMPARISON:  No prior MRI, correlation is made with CT head 09/09/2021 FINDINGS: Brain: No restricted diffusion to suggest acute or subacute infarct. No acute hemorrhage, mass, mass effect, or midline shift. No hemosiderin deposition to suggest remote hemorrhage. No hydrocephalus or extra-axial collection. T2 hyperintense signal in the periventricular white matter, likely the sequela of mild-to-moderate chronic small vessel ischemic disease. Vascular: Normal arterial flow voids. Skull and upper cervical spine: Normal marrow signal. Sinuses/Orbits: No acute finding. Status post bilateral lens replacements. Other: Trace fluid in the left mastoid air cells. IMPRESSION: No acute intracranial process. No evidence of acute or subacute infarct. Electronically Signed   By: Merilyn Baba M.D.   On: 10/02/2021 23:56   MR Lumbar Spine Wo Contrast  Result Date: 09/20/2021 CLINICAL DATA:  Low back pain, right leg and ankle pain EXAM: MRI LUMBAR SPINE WITHOUT CONTRAST TECHNIQUE: Multiplanar, multisequence MR imaging of the lumbar spine was performed. No intravenous contrast was administered. COMPARISON:  None Available. FINDINGS: Segmentation:  Standard. Alignment:  Mild dextrocurvature.  No significant listhesis. Vertebrae: No acute fracture or suspicious osseous lesion. Degenerative changes about the L5-S1 endplates. Conus medullaris and cauda equina: Conus extends to the L1 level. Conus and cauda equina appear normal. Paraspinal and other soft tissues: Large right renal cyst. Disc levels: T12-L1: No significant disc bulge. No spinal canal stenosis or neural  foraminal narrowing. L1-L2: No significant disc bulge. No spinal canal stenosis or neural foraminal narrowing. L2-L3: Mild disc bulge. Mild facet arthropathy. No spinal canal stenosis or neural foraminal narrowing. L3-L4: No significant disc bulge. Mild facet arthropathy. No spinal canal stenosis or neural foraminal narrowing. L4-L5: Mild disc bulge. Mild facet arthropathy. No spinal canal stenosis. Mild narrowing of the lateral recesses. No neural foraminal narrowing. L5-S1: Disc height loss with mild disc bulge with central disc extrusion with 4 mm of caudal migration, somewhat eccentric to the left, which appears to contact the descending S1 nerve roots. No spinal canal stenosis or neural foraminal narrowing. IMPRESSION: 1. L5-S1 disc extrusion with caudal migration, which appears to contact the descending S1 nerve roots. 2. Narrowing of the lateral recesses at L4-L5, which could affect the descending L5 nerve roots. 3. No spinal canal stenosis or neural foraminal narrowing. Electronically Signed   By: Merilyn Baba M.D.   On: 09/81/1914 78:29       Delora Fuel, MD 56/21/30 [phone]

## 2021-10-02 NOTE — ED Provider Notes (Signed)
[facility] Provider Note   CSN: [id number] Arrival date & time: 10/02/21  1649     History  No chief complaint on file.   [name] is a 66 y.o. female.  HPI 66 year old female presents with a chief complaint of hypertension.  She has been dealing with this for at least 3 weeks.  She was seen here on 5/3 for a similar issue.  She has been battling high blood pressure for a long time.  She has been on numerous medications and had multiple medications adjusted.  No recent change according to her.  She can tell when her blood pressure goes up because she gets a headache, usually worse on the left side.  Gets lightheaded/dizzy.  On and off visual complaints.  She is also been noticing some left hand weakness.  This seems to last several minutes and comes 2 or 3 times a week.  Does not happen every time with the headache.  No current headache.  She also gets nauseated when this happens.  She has been having some chest pressure, as recently as today though this is also gone currently.  She is concerned because the lowest blood pressure over the last 3 weeks has been 758 systolic and has been as high as 220.  She reports compliance with all of her meds.  Home Medications Prior to Admission medications   Medication Sig Start Date End Date Taking? Authorizing Provider  acetaminophen (TYLENOL) 500 MG tablet Take 500 mg by mouth every 6 (six) hours as needed for moderate pain or headache.    [provider]  ALPRAZolam Duanne Moron) 0.5 MG tablet TAKE 1 TABLET(0.5 MG) BY MOUTH TWICE DAILY AS NEEDED FOR ANXIETY 08/18/21   Susy Frizzle, MD  amLODipine (NORVASC) 10 MG tablet TAKE 1 TABLET(10 MG) BY MOUTH AT BEDTIME 09/23/21   Susy Frizzle, MD  aspirin EC 81 MG tablet Take 81 mg by mouth at bedtime.     [provider]  Calcium 500 MG tablet Take 500 mg by mouth in the morning and at bedtime.    [provider]   Carboxymethylcellul-Glycerin (CLEAR EYES FOR DRY EYES) 1-0.25 % SOLN Place 1 drop into both eyes daily as needed (dry eyes).    [provider]  celecoxib (CELEBREX) 200 MG capsule Take 1 capsule (200 mg total) by mouth daily. 09/08/21   Susy Frizzle, MD  cholecalciferol (VITAMIN D3) 25 MCG (1000 UT) tablet Take 2,000 Units by mouth daily.    [provider]  cloNIDine (CATAPRES) 0.1 MG tablet Take 0.1 mg by mouth See admin instructions. Take 1/2 tablet daily as needed for blood pressure    [provider]  Coenzyme Q10 (COQ10) 100 MG CAPS Take 100 mg by mouth daily.    [provider]  CRANBERRY PO Take 1 each by mouth 2 (two) times a week. Chewables    [provider]  Cyanocobalamin (B-12) 2500 MCG TABS Take 2,500 mcg by mouth daily.    [provider]  EPINEPHrine 0.3 mg/0.3 mL IJ SOAJ injection Inject 0.3 mg into the muscle as needed for anaphylaxis.  01/05/19   [provider]  gabapentin (NEURONTIN) 300 MG capsule Take 1 capsule (300 mg total) by mouth 2 (two) times daily. 09/29/21   Susy Frizzle, MD  gentamicin ointment (GARAMYCIN) 0.1 % Apply 1 application topically daily as needed (nasal bleeds).    [provider]  hydroquinone 4 % cream  Apply 1 application topically 2 (two) times daily as needed (dark spots). 01/05/21   [provider]  ipratropium (ATROVENT) 0.03 % nasal spray INSTILL 2 SPRAYS IN EACH NOSTRIL EVERY 12 HOURS 04/17/21   Susy Frizzle, MD  linaclotide [facility]) 290 MCG CAPS capsule Take 1 capsule (290 mcg total) by mouth daily before breakfast. 03/16/21   Mahala Menghini, PA-C  loratadine (CLARITIN) 10 MG tablet Take 10 mg by mouth daily as needed for allergies.    [provider]  MAGNESIUM PO Take 330 mg by mouth daily.    [provider]  meloxicam (MOBIC) 15 MG tablet Take 1 tablet (15 mg total) by mouth daily as needed for pain (headache). 09/09/21   Isla Pence, MD  metoprolol succinate (TOPROL-XL) 100 MG 24 hr tablet TAKE 1 TABLET(100 MG) BY MOUTH DAILY Patient taking differently: Take 100 mg by mouth daily. 05/12/21   Susy Frizzle, MD  Omega-3 Fatty Acids (FISH OIL) 1000 MG CAPS Take 1,000 mg by mouth 3 (three) times a week.    [provider]  ondansetron (ZOFRAN) 4 MG tablet TAKE 1 TABLET(4 MG) BY MOUTH EVERY 8 HOURS AS NEEDED FOR NAUSEA OR VOMITING 03/06/21   Mahala Menghini, PA-C  pantoprazole (PROTONIX) 40 MG tablet Take '40mg'$  30 minutes before breakfast and can take additional dose before evening meal if needed. 09/11/21   Mahala Menghini, PA-C  potassium chloride SA (KLOR-CON M) 20 MEQ tablet TAKE 1 TABLET(20 MEQ) BY MOUTH DAILY. 09/10/21   Susy Frizzle, MD  rosuvastatin (CRESTOR) 5 MG tablet TAKE 1 TABLET(5 MG) BY MOUTH DAILY 09/24/21   Susy Frizzle, MD  sodium chloride (OCEAN) 0.65 % SOLN nasal spray Place 1 spray into both nostrils as needed for congestion.    [provider]  triamcinolone (NASACORT) 55 MCG/ACT AERO nasal inhaler Place 1 spray into the nose daily as needed (allergies).    [provider]  triamterene-hydrochlorothiazide (MAXZIDE) 75-50 MG tablet Take 1 tablet by mouth daily. 05/18/21   Susy Frizzle, MD  valsartan (DIOVAN) 320 MG tablet TAKE 1 TABLET BY MOUTH EVERY DAY 04/22/21   Susy Frizzle, MD  vitamin E 400 UNIT capsule Take 400 Units by mouth 3 (three) times a week.    [provider]  zinc gluconate 50 MG tablet Take 50 mg by mouth daily.    [provider]      Allergies    Ciprofloxacin, Latex, and Vitamin c [bioflavonoid products]    Review of Systems   Review of Systems  Constitutional:  Negative for fever.  Eyes:  Positive for visual disturbance.  Respiratory:  Positive for shortness of breath.   Cardiovascular:  Positive for chest pain.  Neurological:  Positive for dizziness, weakness, light-headedness, numbness (tingling in left fingers) and  headaches.   Physical Exam Updated Vital Signs BP (!) 153/74   Pulse (!) 50   Temp 99 F (37.2 C) (Oral)   Resp 11   Ht '5\' 6"'$  (1.676 m)   Wt 76.2 kg   SpO2 100%   BMI 27.12 kg/m  Physical Exam Vitals and nursing note reviewed.  Constitutional:      General: She is not in acute distress.    Appearance: She is well-developed. She is not ill-appearing or diaphoretic.  HENT:     Head: Normocephalic and atraumatic.  Eyes:     Extraocular Movements: Extraocular movements intact.     Pupils: Pupils are equal, round,  and reactive to light.  Cardiovascular:     Rate and Rhythm: Normal rate and regular rhythm.     Heart sounds: Normal heart sounds.  Pulmonary:     Effort: Pulmonary effort is normal.     Breath sounds: Normal breath sounds.  Abdominal:     Palpations: Abdomen is soft.     Tenderness: There is no abdominal tenderness.  Skin:    General: Skin is warm and dry.  Neurological:     Mental Status: She is alert.     Comments: CN 3-12 grossly intact. 5/5 strength in all 4 extremities. Grossly normal sensation. Normal finger to nose.     ED Results / Procedures / Treatments   Labs (all labs ordered are listed, but only abnormal results are displayed) Labs Reviewed  COMPREHENSIVE METABOLIC PANEL - Abnormal; Notable for the following components:      Result Value   Glucose, Bld 105 (*)    All other components within normal limits  CBC  TROPONIN I (HIGH SENSITIVITY)  TROPONIN I (HIGH SENSITIVITY)    EKG EKG Interpretation  Date/Time:  Friday Oct 02 2021 17:09:18 EDT Ventricular Rate:  68 PR Interval:  162 QRS Duration: 80 QT Interval:  422 QTC Calculation: 448 R Axis:   38 Text Interpretation: Normal sinus rhythm Cannot rule out Anterior infarct , age undetermined nonspecific T waves similar to Mar 2023 Confirmed by Sherwood Gambler [phone]) on 10/02/2021 6:53:25 PM  Radiology DG Chest 2 View  Result Date: 10/02/2021 CLINICAL DATA:  Chest tightness.   Shortness of breath. EXAM: CHEST - 2 VIEW COMPARISON:  AP chest 07/08/2021 FINDINGS: Cardiac silhouette and mediastinal contours are within normal limits. Moderate calcification within the aortic arch. The lungs are clear. No pleural effusion or pneumothorax. Diffuse decreased bone mineralization. Mild multilevel degenerative disc changes of the thoracic spine. Cholecystectomy clips. IMPRESSION: No active cardiopulmonary disease. Electronically Signed   By: Yvonne Kendall M.D.   On: 10/02/2021 17:56    Procedures Procedures    Medications Ordered in ED Medications  acetaminophen (TYLENOL) tablet 1,000 mg (1,000 mg Oral Given 10/02/21 2030)    ED Course/ Medical Decision Making/ A&P                           Medical Decision Making Amount and/or Complexity of Data Reviewed Independent Historian:     Details: daughter External Data Reviewed: radiology and notes. Labs: ordered. Radiology: ordered and independent interpretation performed. ECG/medicine tests: ordered and independent interpretation performed.  Risk OTC drugs.   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  This would correlate with some potential left hand symptoms.  ECG today interpreted by myself and does not show any ischemia.  Chest x-ray images viewed by myself and there is no pneumonia or edema.  Labs are otherwise unremarkable including 2 negative troponins.  I discussed with neurology, Dr.Bhagat, who recommends MRI for potential TIA work-up.  Otherwise she will consult on the patient and discussed diet if she needs treatment for TIA and/or admission. Care transferred to Dr. Roxanne Mins.         Final Clinical Impression(s) / ED Diagnoses Final diagnoses:  None    Rx / DC Orders ED Discharge Orders     None         Sherwood Gambler, MD 10/02/21 2329

## 2021-10-02 NOTE — ED Notes (Signed)
Pt returned from MRI °

## 2021-10-03 NOTE — Discharge Instructions (Signed)
Please continue to monitor your blood pressure at home.  Start taking clonidine 0.1 mg twice a day, every day.  When you see your primary care provider, you may adjust your medications again.  Continue taking aspirin 81 mg every day.  Talk with your primary care provider regarding possibly having an echocardiogram (ultrasound study of the heart) since this is the only test that may be needed to complete the evaluation of a transient ischemic attack.

## 2021-10-06 ENCOUNTER — Encounter: Payer: Self-pay | Admitting: Physician Assistant

## 2021-10-12 ENCOUNTER — Ambulatory Visit: Payer: Medicare Other | Admitting: Physician Assistant

## 2021-10-12 ENCOUNTER — Telehealth: Payer: Self-pay | Admitting: Physician Assistant

## 2021-10-12 ENCOUNTER — Encounter: Payer: Self-pay | Admitting: Physician Assistant

## 2021-10-12 VITALS — BP 139/83 | HR 64 | Resp 18 | Ht 66.0 in | Wt 166.0 lb

## 2021-10-12 DIAGNOSIS — S0990XS Unspecified injury of head, sequela: Secondary | ICD-10-CM | POA: Diagnosis not present

## 2021-10-12 DIAGNOSIS — G8929 Other chronic pain: Secondary | ICD-10-CM | POA: Insufficient documentation

## 2021-10-12 DIAGNOSIS — G44309 Post-traumatic headache, unspecified, not intractable: Secondary | ICD-10-CM

## 2021-10-12 DIAGNOSIS — R519 Headache, unspecified: Secondary | ICD-10-CM

## 2021-10-12 DIAGNOSIS — I10 Essential (primary) hypertension: Secondary | ICD-10-CM

## 2021-10-12 DIAGNOSIS — G250 Essential tremor: Secondary | ICD-10-CM | POA: Diagnosis not present

## 2021-10-12 MED ORDER — TOPIRAMATE 50 MG PO TABS: 50.0000 mg | ORAL_TABLET | Freq: Every day | ORAL | 3 refills | Status: DC

## 2021-10-12 NOTE — Addendum Note (Signed)
Addended by: Sharene Butters E on: 10/12/2021 12:52 PM   Modules accepted: Level of Service

## 2021-10-12 NOTE — Telephone Encounter (Signed)
Patient called and stated she was in the office today and was prescribed Topamax '50mg'$ .  She stated that she has taken 25 mg before and did not like the medication.  She wanted to know if there is something else she can try.

## 2021-10-12 NOTE — Patient Instructions (Addendum)
  Start  Topamax  50 mg at night Contact us in 4 weeks with update and we can increase dose if needed. Follow up with [facility] regarding the narrowing in the arteries Follow up in 3 months  Can discontinue gabapentin  Limit use of pain relievers to no more than 2 days out of the week.  These medications include acetaminophen, NSAIDs (ibuprofen/Advil/Motrin, naproxen/Aleve, triptans (Imitrex/sumatriptan), Excedrin, and narcotics.  This will help reduce risk of rebound headaches. Be aware of common food triggers:  - Caffeine:  coffee, black tea, cola, Mt. Dew  - Chocolate  - Dairy:  aged cheeses (brie, blue, cheddar, gouda, [place], provolone, [place], Swiss, etc), chocolate milk, buttermilk, sour cream, limit eggs and yogurt  - Nuts, peanut butter  - Alcohol  - Cereals/grains:  FRESH breads (fresh bagels, sourdough, doughnuts), yeast productions  - Processed/canned/aged/cured meats (pre-packaged deli meats, hotdogs)  - MSG/glutamate:  soy sauce, flavor enhancer, pickled/preserved/marinated foods  - Sweeteners:  aspartame (Equal, Nutrasweet).  Sugar and Splenda are okay  - Vegetables:  legumes (lima beans, lentils, snow peas, fava beans, pinto peans, peas, garbanzo beans), sauerkraut, onions, olives, pickles  - Fruit:  avocados, bananas, citrus fruit (orange, lemon, grapefruit), mango  - Other:  Frozen meals, macaroni and cheese Routine exercise Stay adequately hydrated (aim for 64 oz water daily) Keep headache diary Maintain proper stress management Maintain proper sleep hygiene Do not skip meals Consider supplements:  magnesium citrate '400mg'$  daily, riboflavin '400mg'$  daily, coenzyme Q10 '100mg'$  three times daily. Pepin

## 2021-10-12 NOTE — Progress Notes (Signed)
[facility] Clinic Note - Initial Visit   Date: 10/12/21  [name] MRN: [id number] DOB: 11-02-1955   Dear Dr Dennard Schaumann, Cammie Mcgee, MD:  Thank you for your kind referral of [name] for consultation of headaches.  Patient is a very pleasant 66 year old woman f initially seen at [facility] neurology for evaluation of essential tremor, currently on primidone.  Patient has a history of hypertension, hyperlipidemia, anxiety, and a history of abdominal aortic aneurysm and prediabetes.  She recently had been seen at the ED on 10/02/2021 with complaints of significant headaches, "jumbled words ", and slight "left hand heaviness with the headaches ".  This last month, her blood pressure has been significantly uncontrolled reaching up to 200s.  She has some floaters in her right eye, otherwise she denies any visual complaints.  She has chronic bilateral foot tingling, but there is no new areas of numbness.  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  Also seen, 45% narrowing of the right vertebral origin.  Pending on these findings, an MRI of the brain without contrast was performed, negative for stroke or other abnormality is.  It was suspected that these symptoms were due to hypertensive emergency, which had resolved at the time of neurological evaluation.  She was discharged on gabapentin 300 mg twice daily, but the patient reduced it to 300 mg at night, because of increased sleepiness.  Patient is on statin, metoprolol, amlodipine, and clonidine as needed as well. Denies any history of TIA. Denies vertigo dizziness or vision changes. Denies dysphagia. No confusion Denies any fever or chills, or night sweats. No tobacco. No new meds or hormonal supplements. Does take a regular  ASA a day, with no other antiplatelets or anticoagulants.Denies any recent long distance trips or recent surgeries. No sick contacts. No new stressors present in personal life.  Patient is compliant with his medications.Patient is active. She is here in follow-up.  She is alone during this visit.  History of Present Illness:  Onset: At night when sleeps, for at least 8 months "if not longer than that ". Quality: throbbing , feels tight and pulsating  Intensity: 8-9/10  Location: B temporal across forehead and sometimes in the occipital area  Duration: 20-40 mins  Frequency: every day  Associated symptoms: nausea, no vomiting, blurred vision see  Aura: wiggly lines   [facility], yoga and machine, during that time she does not report any headaches. Aggravating factors: sounds, lights, dreams when waking up  Relieving factors: trying to calm herself down and tylenol (which she is taking every day)  Current abortive medications: tylenol q d , gabapentin 300 qhs  Current prophylactic medications: none   Past abortive medications:  none  Past prophylactic medications: none    Family history: GF stroke, no family or personal history of headaches or seizures Smoker: no  Alcohol: no  Caffeine: 2 coffees a day Sleep:  not to well, 9-mn, then 3 am tll 7    CTA 09/09/2021 personally reviewed, agree with radiology: 1. No emergent finding. 2. At least 70% atheromatous narrowing of the right common carotid bifurcation and right cavernous ICA. 50% stenosis of the paraclinoid right ICA. 3. 45% narrowing at the right vertebral origin. 4. Mild intracranial branch atherosclerosis.  MRI of the brain 10/02/2021 no acute intracranial process. No evidence of acute or subacute infarct.   Out-side paper records, electronic medical record, and images have been reviewed where available and summarized as:  Lab Results  Component Value Date   HGBA1C 5.9 (H) 01/20/2021   Lab  Results  Component Value Date   VITAMINB12 >2,000 (H) 01/20/2021   Lab Results  Component Value Date   TSH 1.84 10/29/2019   Lab Results  Component Value Date   ESRSEDRATE 14 06/10/2020    Past Medical History:  Diagnosis Date   Abdominal aortic aneurysm (AAA) 3.0 cm to 5.0 cm in diameter in female [facility])    None seen on prior imaging?    Anxiety    Cataract    Chronic left shoulder pain    Headache    Hiatal hernia    small   Hyperlipidemia    Hypertension    Pre-diabetes    Schatzki's ring    Seasonal allergies    Tremor     Past Surgical History:  Procedure Laterality Date   CARDIAC CATHETERIZATION  2005   "Ms. Newhard has essentially normal coronary arteries and normal left ventricular function.  She will be treated empirically with anti-reflux measures"   CATARACT EXTRACTION, BILATERAL     CHOLECYSTECTOMY     COLONOSCOPY    06/16/2004   SHF:WYOVZC rectum/Diminutive polyps of the splenic flexure and the cecum/The remainder of the colonic mucosa appeared normal pathology (hyperplastic polyps).   COLONOSCOPY N/A 08/07/2015   Dr. Gala Romney: 6 mm descending colon tubular adenoma, multiple medium-mouthed diverticula in sigmoid colon. surveillance 2022   COLONOSCOPY N/A 04/15/2021   Procedure: COLONOSCOPY;  Surgeon: Daneil Dolin, MD;  Location: [facility];  Service: Endoscopy;  Laterality: N/A;  9:30am   ESOPHAGOGASTRODUODENOSCOPY  09/11/2001   HYI:FOYDXAJOI ring otherwise normal esophagus/Normal stomach/Normal D1 and D2 status post passage of a 56 French Maloney dilator   ESOPHAGOGASTRODUODENOSCOPY N/A 11/11/2017   Procedure: ESOPHAGOGASTRODUODENOSCOPY (EGD);  Surgeon: Daneil Dolin, MD; mild Schatzki ring s/p dilation and mild erosive gastropathy.   ESOPHAGOGASTRODUODENOSCOPY (EGD) WITH ESOPHAGEAL DILATION N/A 01/25/2013   Dr. Gala Romney- schatzki's ring- 16 F dilation. small hiatal hernia   HYSTEROSCOPY WITH D & C N/A 03/06/2019   Procedure: DILATATION AND CURETTAGE  /HYSTEROSCOPY;  Surgeon: Jonnie Kind, MD;  Location: [facility] ORS;  Service: Gynecology;  Laterality: N/A;   MALONEY DILATION N/A 11/11/2017   Procedure: [name] DILATION;  Surgeon: Daneil Dolin, MD;  Location: [facility];  Service: Endoscopy;  Laterality: N/A;   POLYPECTOMY N/A 03/06/2019   Procedure: POLYPECTOMY ( REMOVAL OF ENDOMETRIAL POLYP);  Surgeon: Jonnie Kind, MD;  Location: [facility];  Service: Gynecology;  Laterality: N/A;   POLYPECTOMY  04/15/2021   Procedure: POLYPECTOMY;  Surgeon: Daneil Dolin, MD;  Location: [facility];  Service: Endoscopy;;   TUBAL LIGATION       Medications:  Outpatient Encounter Medications as of 10/12/2021  Medication Sig   acetaminophen (TYLENOL) 500 MG tablet Take 500 mg by mouth every 6 (six) hours as needed for moderate pain or headache.   ALPRAZolam (XANAX) 0.5 MG tablet TAKE 1 TABLET(0.5 MG) BY MOUTH TWICE DAILY AS NEEDED FOR ANXIETY   amLODipine (NORVASC) 10 MG tablet TAKE 1 TABLET(10 MG) BY MOUTH AT BEDTIME   aspirin EC 81 MG tablet Take 81 mg by mouth at bedtime.    Calcium 500 MG tablet Take 500 mg by mouth in the morning and at  bedtime.   Carboxymethylcellul-Glycerin (CLEAR EYES FOR DRY EYES) 1-0.25 % SOLN Place 1 drop into both eyes daily as needed (dry eyes).   celecoxib (CELEBREX) 200 MG capsule Take 1 capsule (200 mg total) by mouth daily.   cholecalciferol (VITAMIN D3) 25 MCG (1000 UT) tablet Take 2,000 Units by mouth daily.   cloNIDine (CATAPRES) 0.1 MG tablet Take 0.1 mg by mouth See admin instructions. Take 1/2 tablet daily as needed for blood pressure   Coenzyme Q10 (COQ10) 100 MG CAPS Take 100 mg by mouth daily.   CRANBERRY PO Take 1 each by mouth 2 (two) times a week. Chewables   Cyanocobalamin (B-12) 2500 MCG TABS Take 2,500 mcg by mouth daily.   EPINEPHrine 0.3 mg/0.3 mL IJ SOAJ injection Inject 0.3 mg into the muscle as needed for anaphylaxis.    gabapentin (NEURONTIN) 300 MG capsule Take 1 capsule (300 mg total) by mouth  2 (two) times daily.   gentamicin ointment (GARAMYCIN) 0.1 % Apply 1 application topically daily as needed (nasal bleeds).   hydroquinone 4 % cream Apply 1 application topically 2 (two) times daily as needed (dark spots).   ipratropium (ATROVENT) 0.03 % nasal spray INSTILL 2 SPRAYS IN EACH NOSTRIL EVERY 12 HOURS   linaclotide (LINZESS) 290 MCG CAPS capsule Take 1 capsule (290 mcg total) by mouth daily before breakfast.   loratadine (CLARITIN) 10 MG tablet Take 10 mg by mouth daily as needed for allergies.   MAGNESIUM PO Take 330 mg by mouth daily.   meloxicam (MOBIC) 15 MG tablet Take 1 tablet (15 mg total) by mouth daily as needed for pain (headache).   metoprolol succinate (TOPROL-XL) 100 MG 24 hr tablet TAKE 1 TABLET(100 MG) BY MOUTH DAILY (Patient taking differently: Take 100 mg by mouth daily.)   Omega-3 Fatty Acids (FISH OIL) 1000 MG CAPS Take 1,000 mg by mouth 3 (three) times a week.   ondansetron (ZOFRAN) 4 MG tablet TAKE 1 TABLET(4 MG) BY MOUTH EVERY 8 HOURS AS NEEDED FOR NAUSEA OR VOMITING   pantoprazole (PROTONIX) 40 MG tablet Take '40mg'$  30 minutes before breakfast and can take additional dose before evening meal if needed.   potassium chloride SA (KLOR-CON M) 20 MEQ tablet TAKE 1 TABLET(20 MEQ) BY MOUTH DAILY.   rosuvastatin (CRESTOR) 5 MG tablet TAKE 1 TABLET(5 MG) BY MOUTH DAILY   sodium chloride (OCEAN) 0.65 % SOLN nasal spray Place 1 spray into both nostrils as needed for congestion.   topiramate (TOPAMAX) 50 MG tablet Take 1 tablet (50 mg total) by mouth at bedtime.   triamcinolone (NASACORT) 55 MCG/ACT AERO nasal inhaler Place 1 spray into the nose daily as needed (allergies).   triamterene-hydrochlorothiazide (MAXZIDE) 75-50 MG tablet Take 1 tablet by mouth daily.   valsartan (DIOVAN) 320 MG tablet TAKE 1 TABLET BY MOUTH EVERY DAY   vitamin E 400 UNIT capsule Take 400 Units by mouth 3 (three) times a week.   zinc gluconate 50 MG tablet Take 50 mg by mouth daily.   No  facility-administered encounter medications on file as of 10/12/2021.    Allergies:  Allergies  Allergen Reactions   Ciprofloxacin Shortness Of Breath   Latex Other (See Comments)    Burns skin   Vitamin C [Bioflavonoid Products] Itching    Family History: Family History  Problem Relation Age of Onset   Diabetes Other    Diabetes Mother    Heart disease Mother    Heart disease Father    Diabetes Sister  Diabetes Brother    Hypertension Brother    Diabetes Daughter    Hypertension Maternal Grandmother    Stroke Maternal Grandfather    Early death Paternal Grandfather    Colon cancer Neg Hx    Liver disease Neg Hx     Social History: Social History   Tobacco Use   Smoking status: Former    Packs/day: 0.25    Years: 35.00    Pack years: 8.75    Types: Cigarettes    Quit date: 12/31/2012    Years since quitting: 8.7   Smokeless tobacco: Never  Vaping Use   Vaping Use: Never used  Substance Use Topics   Alcohol use: Not Currently   Drug use: No   Social History   Social History Narrative   Eats all food groups.    Wears seatbelt.    Has 3 children.   Prior smoker.   Attends church.    Used to work in [employer].    Drives.   Enjoys going out with family.    Right handed   One story home    Vital Signs:  BP 139/83   Pulse 64   Resp 18   Ht '5\' 6"'$  (1.676 m)   Wt 166 lb (75.3 kg)   SpO2 99%   BMI 26.79 kg/m    General Medical Exam:   General:  Well appearing, comfortable.   Eyes/ENT: see cranial nerve examination.   Neck:   No carotid bruits. Respiratory:  Clear to auscultation, good air entry bilaterally.   Cardiac:  Regular rate and rhythm, no murmur.   Extremities:  No deformities, edema, or skin discoloration.  Skin:  No rashes or lesions.  Neurological Exam: MENTAL STATUS including orientation to time, place, person, recent and remote memory, attention span and concentration, language, and fund of knowledge is normal.  Speech is not  dysarthric.  CRANIAL NERVES: II:  No visual field defects.  Unremarkable fundi.   III-IV-VI: Pupils equal round and reactive to light.  Normal conjugate, extra-ocular eye movements in all directions of gaze.  No nystagmus.  No ptosis.   V:  Normal facial sensation.    VII:  Normal facial symmetry and movements.   VIII:  Normal hearing and vestibular function.   IX-X:  Normal palatal movement.   XI:  Normal shoulder shrug and head rotation.   XII:  Normal tongue strength and range of motion, no deviation or fasciculation.  MOTOR:  No atrophy, fasciculations. R hand >L tremor, worse on intention, no head tremor.  No pronator drift.    SENSORY:  Normal and symmetric perception of light touch, pinprick, vibration, and proprioception.  Romberg's sign absent.   COORDINATION/GAIT: Normal finger-to- nose-finger and heel-to-shin.  Intact rapid alternating movements bilaterally.  Able to rise from a chair without using arms.  Gait narrow based and stable. Tandem and stressed gait intact.    IMPRESSION/PLAN:  Headaches, likely due to uncontrollable hypertension. 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  Also seen, 45% narrowing of the right vertebral origin.  Pending on these findings, an MRI of the brain without contrast was performed, negative for stroke or other abnormality is.  She is on gabapentin 300 mg twice daily, but the patient reduced it to 300 mg at night, because of increased sleepiness.  For abortive therapy, Topamax 50 mg qhs  Can d/c Gabapentin  Limit use of pain relievers to no more than 2 days out of week to prevent risk of rebound or medication-overuse headache. Follow up with [facility]  for B CAS  Keep headache diary  Exercise, hydration, caffeine cessation, sleep hygiene, monitor for and  avoid triggers  Consider:  magnesium citrate '400mg'$  daily, riboflavin '400mg'$  daily, and coenzyme Q10 '100mg'$  three times daily Strong monitor of Blood pressure with current med regimen  as per  Cards and PCP Follow up in 3 months    Essential Tremors No significant changes from prior visit  Continue metoprolol    Total time spent:  61 min   Thank you for allowing me to participate in patient's care.  If I can answer any additional questions, I would be pleased to do so.    Sincerely,   Sharene Butters, PA-C

## 2021-10-13 ENCOUNTER — Telehealth: Payer: Self-pay | Admitting: Family Medicine

## 2021-10-13 NOTE — Telephone Encounter (Signed)
Can try low dose Zonisamide '50mg'$  qhs and see if she tolerates this better. Side effects can be drowsiness (so take at bedtime), numbness or tingling in hands and feet (this usually goes away as body gets used to medication), soda/carbonated drinks can taste different. If she is agreeable, pls cancel the Topiramate and send Rx for Zonisamide '50mg'$  1 cap qhs, thanks

## 2021-10-13 NOTE — Telephone Encounter (Signed)
She went ahead and picked up Rx for Toprimate  and will not be taken Zonisamide at this time, Rx was not sent in. Routing to you. FYI only, thanks

## 2021-10-13 NOTE — Telephone Encounter (Signed)
Patient left vm stating that she seen her neurologist today and they wanted to know if patient could take something else besides xanax. I have left vm asking pt to return my call. She will need to be seen to discuss medication change.  CB# [phone]

## 2021-10-23 ENCOUNTER — Other Ambulatory Visit: Payer: Self-pay | Admitting: Family Medicine

## 2021-10-29 ENCOUNTER — Ambulatory Visit (INDEPENDENT_AMBULATORY_CARE_PROVIDER_SITE_OTHER): Payer: Medicare Other | Admitting: Family Medicine

## 2021-10-29 VITALS — BP 164/70 | HR 68 | Temp 98.8°F | Resp 18 | Wt 168.0 lb

## 2021-10-29 DIAGNOSIS — F419 Anxiety disorder, unspecified: Secondary | ICD-10-CM | POA: Diagnosis not present

## 2021-10-29 MED ORDER — HYDROXYZINE PAMOATE 25 MG PO CAPS: 25.0000 mg | ORAL_CAPSULE | Freq: Three times a day (TID) | ORAL | 0 refills | Status: DC | PRN

## 2021-10-29 MED ORDER — TRIAMCINOLONE ACETONIDE 0.1 % EX CREA: 1.0000 | TOPICAL_CREAM | Freq: Two times a day (BID) | CUTANEOUS | 0 refills | Status: DC

## 2021-10-29 NOTE — Progress Notes (Signed)
Subjective:    Patient ID: [name], female    DOB: October 11, 1955, 66 y.o.   MRN: [id number] Patient presents today to discuss options for her anxiety.  She is been taking alprazolam 1-2 times a week for quite some time.  She recently saw a neurologist who recommended that she discontinue this medicine and talk with me about options to replace it.  She denies depression.  She denies daily anxiety.  Instead she uses the alprazolam sparingly however she does not feel that the medication is working as well for her.  She feels that she is "gotten used to it".  She denies any suicidal ideation or depression. Past Medical History:  Diagnosis Date   Abdominal aortic aneurysm (AAA) 3.0 cm to 5.0 cm in diameter in female [facility])    None seen on prior imaging?    Anxiety    Cataract    Chronic left shoulder pain    Headache    Hiatal hernia    small   Hyperlipidemia    Hypertension    Pre-diabetes    Schatzki's ring    Seasonal allergies    Tremor      Past Surgical History:  Procedure Laterality Date   CARDIAC CATHETERIZATION  2005   "[name] has essentially normal coronary arteries and normal left ventricular function.  She will be treated empirically with anti-reflux measures"   CATARACT EXTRACTION, BILATERAL     CHOLECYSTECTOMY     COLONOSCOPY    06/16/2004   BPZ:WCHENI rectum/Diminutive polyps of the splenic flexure and the cecum/The remainder of the colonic mucosa appeared normal pathology (hyperplastic polyps).   COLONOSCOPY N/A 08/07/2015   Dr. Gala Romney: 6 mm descending colon tubular adenoma, multiple medium-mouthed diverticula in sigmoid colon. surveillance 2022   COLONOSCOPY N/A 04/15/2021   Procedure: COLONOSCOPY;  Surgeon: Daneil Dolin, MD;  Location: [facility];  Service: Endoscopy;  Laterality: N/A;  9:30am   ESOPHAGOGASTRODUODENOSCOPY  09/11/2001   DPO:EUMPNTIRW ring otherwise normal esophagus/Normal stomach/Normal D1 and D2 status post passage of a 56 French  Maloney dilator   ESOPHAGOGASTRODUODENOSCOPY N/A 11/11/2017   Procedure: ESOPHAGOGASTRODUODENOSCOPY (EGD);  Surgeon: Daneil Dolin, MD; mild Schatzki ring s/p dilation and mild erosive gastropathy.   ESOPHAGOGASTRODUODENOSCOPY (EGD) WITH ESOPHAGEAL DILATION N/A 01/25/2013   Dr. Gala Romney- schatzki's ring- 16 F dilation. small hiatal hernia   HYSTEROSCOPY WITH D & C N/A 03/06/2019   Procedure: DILATATION AND CURETTAGE /HYSTEROSCOPY;  Surgeon: Jonnie Kind, MD;  Location: [facility];  Service: Gynecology;  Laterality: N/A;   MALONEY DILATION N/A 11/11/2017   Procedure: [name] DILATION;  Surgeon: Daneil Dolin, MD;  Location: [facility];  Service: Endoscopy;  Laterality: N/A;   POLYPECTOMY N/A 03/06/2019   Procedure: POLYPECTOMY ( REMOVAL OF ENDOMETRIAL POLYP);  Surgeon: Jonnie Kind, MD;  Location: [facility];  Service: Gynecology;  Laterality: N/A;   POLYPECTOMY  04/15/2021   Procedure: POLYPECTOMY;  Surgeon: Daneil Dolin, MD;  Location: [facility];  Service: Endoscopy;;   TUBAL LIGATION     Current Outpatient Medications on File Prior to Visit  Medication Sig Dispense Refill   ALPRAZolam (XANAX) 0.5 MG tablet TAKE 1 TABLET(0.5 MG) BY MOUTH TWICE DAILY AS NEEDED FOR ANXIETY 60 tablet 2   amLODipine (NORVASC) 10 MG tablet TAKE 1 TABLET(10 MG) BY MOUTH AT BEDTIME 90 tablet 3   aspirin EC 81 MG tablet Take 81 mg by mouth at bedtime.      Calcium 500 MG tablet  Take 500 mg by mouth in the morning and at bedtime.     cholecalciferol (VITAMIN D3) 25 MCG (1000 UT) tablet Take 2,000 Units by mouth daily.     cloNIDine (CATAPRES) 0.1 MG tablet Take 0.1 mg by mouth See admin instructions. Take 1/2 tablet daily as needed for blood pressure     linaclotide (LINZESS) 290 MCG CAPS capsule Take 1 capsule (290 mcg total) by mouth daily before breakfast. 90 capsule 3   loratadine (CLARITIN) 10 MG tablet Take 10 mg by mouth daily as needed for allergies.     MAGNESIUM PO Take 330 mg by mouth daily.      metoprolol succinate (TOPROL-XL) 100 MG 24 hr tablet TAKE 1 TABLET(100 MG) BY MOUTH DAILY (Patient taking differently: Take 100 mg by mouth daily.) 90 tablet 3   Omega-3 Fatty Acids (FISH OIL) 1000 MG CAPS Take 1,000 mg by mouth 3 (three) times a week.     ondansetron (ZOFRAN) 4 MG tablet TAKE 1 TABLET(4 MG) BY MOUTH EVERY 8 HOURS AS NEEDED FOR NAUSEA OR VOMITING 30 tablet 0   pantoprazole (PROTONIX) 40 MG tablet Take '40mg'$  30 minutes before breakfast and can take additional dose before evening meal if needed. 60 tablet 5   potassium chloride SA (KLOR-CON M) 20 MEQ tablet TAKE 1 TABLET(20 MEQ) BY MOUTH DAILY. 30 tablet 5   rosuvastatin (CRESTOR) 5 MG tablet TAKE 1 TABLET(5 MG) BY MOUTH DAILY 90 tablet 3   topiramate (TOPAMAX) 50 MG tablet Take 1 tablet (50 mg total) by mouth at bedtime. 30 tablet 3   triamcinolone (NASACORT) 55 MCG/ACT AERO nasal inhaler Place 1 spray into the nose daily as needed (allergies).     triamterene-hydrochlorothiazide (MAXZIDE) 75-50 MG tablet Take 1 tablet by mouth daily. 90 tablet 3   valsartan (DIOVAN) 320 MG tablet TAKE 1 TABLET BY MOUTH EVERY DAY 90 tablet 0   acetaminophen (TYLENOL) 500 MG tablet Take 500 mg by mouth every 6 (six) hours as needed for moderate pain or headache.     Carboxymethylcellul-Glycerin (CLEAR EYES FOR DRY EYES) 1-0.25 % SOLN Place 1 drop into both eyes daily as needed (dry eyes).     Coenzyme Q10 (COQ10) 100 MG CAPS Take 100 mg by mouth daily.     CRANBERRY PO Take 1 each by mouth 2 (two) times a week. Chewables     Cyanocobalamin (B-12) 2500 MCG TABS Take 2,500 mcg by mouth daily.     EPINEPHrine 0.3 mg/0.3 mL IJ SOAJ injection Inject 0.3 mg into the muscle as needed for anaphylaxis.      hydroquinone 4 % cream Apply 1 application topically 2 (two) times daily as needed (dark spots).     ipratropium (ATROVENT) 0.03 % nasal spray INSTILL 2 SPRAYS IN EACH NOSTRIL EVERY 12 HOURS 30 mL 12   sodium chloride (OCEAN) 0.65 % SOLN nasal spray Place 1  spray into both nostrils as needed for congestion.     vitamin E 400 UNIT capsule Take 400 Units by mouth 3 (three) times a week.     zinc gluconate 50 MG tablet Take 50 mg by mouth daily.     No current facility-administered medications on file prior to visit.   Allergies  Allergen Reactions   Ciprofloxacin Shortness Of Breath   Latex Other (See Comments)    Burns skin   Vitamin C [Bioflavonoid Products] Itching   Social History   Socioeconomic History   Marital status: Single    Spouse name: Not  on file   Number of children: 3   Years of education: Not on file   Highest education level: Not on file  Occupational History   Occupation: English as a second language teacher: [employer]  Tobacco Use   Smoking status: Former    Packs/day: 0.25    Years: 35.00    Total pack years: 8.75    Types: Cigarettes    Quit date: 12/31/2012    Years since quitting: 8.8   Smokeless tobacco: Never  Vaping Use   Vaping Use: Never used  Substance and Sexual Activity   Alcohol use: Not Currently   Drug use: No   Sexual activity: Not Currently    Partners: Male    Birth control/protection: Post-menopausal, Surgical    Comment: tubal  Other Topics Concern   Not on file  Social History Narrative   Eats all food groups.    Wears seatbelt.    Has 3 children.   Prior smoker.   Attends church.    Used to work in [employer].    Drives.   Enjoys going out with family.    Right handed   One story home   Social Determinants of Health   Financial Resource Strain: Low Risk  (09/21/2019)   Overall Financial Resource Strain (CARDIA)    Difficulty of Paying Living Expenses: Not very hard  Food Insecurity: Not on file  Transportation Needs: Not on file  Physical Activity: Not on file  Stress: Not on file  Social Connections: Not on file  Intimate Partner Violence: Not on file   Family History  Problem Relation Age of Onset   Diabetes Other    Diabetes Mother    Heart disease Mother     Heart disease Father    Diabetes Sister    Diabetes Brother    Hypertension Brother    Diabetes Daughter    Hypertension Maternal Grandmother    Stroke Maternal Grandfather    Early death Paternal Grandfather    Colon cancer Neg Hx    Liver disease Neg Hx       Review of Systems  Neurological:  Positive for headaches.  All other systems reviewed and are negative.      Objective:   Physical Exam Vitals reviewed.  Constitutional:      Appearance: She is well-developed.  HENT:     Right Ear: Tympanic membrane and ear canal normal.     Left Ear: Tympanic membrane and ear canal normal.     Nose:     Right Sinus: No maxillary sinus tenderness or frontal sinus tenderness.     Left Sinus: No maxillary sinus tenderness or frontal sinus tenderness.  Eyes:     General: No visual field deficit.    Extraocular Movements:     Right eye: Normal extraocular motion and no nystagmus.     Left eye: Normal extraocular motion and no nystagmus.     Conjunctiva/sclera: Conjunctivae normal.     Pupils:     Right eye: Pupil is round and reactive.     Left eye: Pupil is round and reactive.  Cardiovascular:     Rate and Rhythm: Normal rate and regular rhythm.     Heart sounds: Normal heart sounds. No murmur heard.    No friction rub. No gallop.  Pulmonary:     Effort: Pulmonary effort is normal. No respiratory distress.     Breath sounds: Normal breath sounds. No stridor. No wheezing or rales.  Abdominal:  General: Abdomen is flat. Bowel sounds are normal. There is no distension.     Palpations: Abdomen is soft.     Tenderness: There is no abdominal tenderness. There is no guarding.  Musculoskeletal:     Right hand: Normal. No swelling, deformity, tenderness or bony tenderness. Normal range of motion.     Left hand: Normal. No swelling, deformity, tenderness or bony tenderness. Normal range of motion.     Cervical back: Normal range of motion. No rigidity.  Neurological:     Mental  Status: She is alert and oriented to person, place, and time. Mental status is at baseline.     Cranial Nerves: No cranial nerve deficit, dysarthria or facial asymmetry.     Sensory: No sensory deficit.     Motor: Tremor present. No weakness or abnormal muscle tone.     Coordination: Romberg sign negative. Coordination normal.     Gait: Gait normal.           Assessment & Plan:  Anxiety Patient has occasional episodic anxiety.  At the present time I do not feel that she would benefit from a daily SSRI.  She is only using the benzodiazepine 2 times a week.  I do not feel that this is dangerous for her at this point however I will be willing to switch.  She is interested in something not habit-forming so we will try hydroxyzine 25 mg every 8 hours as needed for anxiety.  Blood pressure is elevated today however she is checking her blood pressure at home daily and is typically 120/80 in that range.  She has whitecoat syndrome.

## 2021-11-05 ENCOUNTER — Other Ambulatory Visit: Payer: Medicare Other

## 2021-11-05 DIAGNOSIS — Z794 Long term (current) use of insulin: Secondary | ICD-10-CM | POA: Diagnosis not present

## 2021-11-05 DIAGNOSIS — E118 Type 2 diabetes mellitus with unspecified complications: Secondary | ICD-10-CM | POA: Diagnosis not present

## 2021-11-06 LAB — HEMOGLOBIN A1C
Hgb A1c MFr Bld: 6.1 % of total Hgb — ABNORMAL HIGH (ref <5.7)
Mean Plasma Glucose: 128 mg/dL
eAG (mmol/L): 7.1 mmol/L

## 2021-11-19 ENCOUNTER — Telehealth: Payer: Self-pay | Admitting: Pharmacist

## 2021-11-19 NOTE — Progress Notes (Signed)
Chronic Care Management Pharmacy Assistant   Name: [name]  MRN: [id number] DOB: 11-06-1955   Reason for Encounter: Disease State - Diabetes Call     Recent office visits:  10/29/21 [name], MD - Family Medicine - Anxiety - hydrOXYzine (VISTARIL) 25 MG capsule, triamcinolone (NASACORT) 55 MCG/ACT AERO nasal inhaler, triamcinolone cream (KENALOG) 0.1 % prescribed. Follow up as scheduled.   09/29/21 [name], MD - Family Medicine - Unilateral headache - gabapentin (NEURONTIN) 300 MG capsule prescribed. Follow up as scheduled.   09/08/21 [name], MD - Family Medicine - Lumbar radiculopathy - MRI of Lumbar spine ordered. celecoxib (CELEBREX) 200 MG capsule prescribed. Referred for epidural injections. Follow up as scheduled.    Recent consult visits:  10/12/21 Sharene Butters, PA-C - Neurology - Essential tremor - topiramate (TOPAMAX) 50 MG tablet prescribed. Discontinue Gabapentin. Follow up in 3 months.    Hospital visits: 10/02/21 - 10/03/21 Medication Reconciliation was completed by comparing discharge summary, patient's EMR and Pharmacy list, and upon discussion with patient.   Admitted to the hospital on 10/02/21 due to TIA. Discharge date was 10/03/21. Discharged from [place]?Medications Started at [facility] Discharge:?? None noted.    Medication Changes at Hospital Discharge: None noted.   Medications Discontinued at Hospital Discharge: None noted.    Medications that remain the same after Hospital Discharge:??  All other medications will remain the same.   Hospital visits: 09/09/21 Medication Reconciliation was completed by comparing discharge summary, patient's EMR and Pharmacy list, and upon discussion with patient.   Admitted to the hospital on 09/09/21 due to Headache. Discharge date was 09/09/21. Discharged from [place]?Medications Started at [facility] Discharge:?? meloxicam (MOBIC) 15 MG tablet    Medication Changes at Hospital Discharge: None noted.   Medications Discontinued at Hospital Discharge: None noted.    Medications that remain the same after Hospital Discharge:??  All other medications will remain the same.   Hospital visits: 07/08/21 - 07/09/21 Medication Reconciliation was completed by comparing discharge summary, patient's EMR and Pharmacy list, and upon discussion with patient.   Admitted to the hospital on 07/08/21 due to Hypertension. Discharge date was 07/09/21. Discharged from [place]?Medications Started at [facility] Discharge:?? None noted.    Medication Changes at Hospital Discharge: None noted.   Medications Discontinued at Hospital Discharge: None noted.    Medications that remain the same after Hospital Discharge:??  All other medications will remain the same.   Medications: Outpatient Encounter Medications as of 11/19/2021  Medication Sig   acetaminophen (TYLENOL) 500 MG tablet Take 500 mg by mouth every 6 (six) hours as needed for moderate pain or headache.   ALPRAZolam (XANAX) 0.5 MG tablet TAKE 1 TABLET(0.5 MG) BY MOUTH TWICE DAILY AS NEEDED FOR ANXIETY   amLODipine (NORVASC) 10 MG tablet TAKE 1 TABLET(10 MG) BY MOUTH AT BEDTIME   aspirin EC 81 MG tablet Take 81 mg by mouth at bedtime.    Calcium 500 MG tablet Take 500 mg by mouth in the morning and at bedtime.   Carboxymethylcellul-Glycerin (CLEAR EYES FOR DRY EYES) 1-0.25 % SOLN Place 1 drop into both eyes daily as needed (dry eyes).   cholecalciferol (VITAMIN D3) 25 MCG (1000 UT) tablet Take 2,000 Units by mouth daily.   cloNIDine (CATAPRES) 0.1 MG tablet Take 0.1 mg by mouth See admin instructions. Take 1/2 tablet daily as needed  for blood pressure   Coenzyme Q10 (COQ10) 100 MG CAPS Take 100 mg by mouth daily.   CRANBERRY PO Take 1 each by mouth 2 (two) times a week. Chewables   Cyanocobalamin (B-12) 2500 MCG TABS Take 2,500 mcg by mouth daily.   EPINEPHrine 0.3 mg/0.3 mL  IJ SOAJ injection Inject 0.3 mg into the muscle as needed for anaphylaxis.    hydroquinone 4 % cream Apply 1 application topically 2 (two) times daily as needed (dark spots).   hydrOXYzine (VISTARIL) 25 MG capsule Take 1 capsule (25 mg total) by mouth every 8 (eight) hours as needed.   ipratropium (ATROVENT) 0.03 % nasal spray INSTILL 2 SPRAYS IN EACH NOSTRIL EVERY 12 HOURS   linaclotide (LINZESS) 290 MCG CAPS capsule Take 1 capsule (290 mcg total) by mouth daily before breakfast.   loratadine (CLARITIN) 10 MG tablet Take 10 mg by mouth daily as needed for allergies.   MAGNESIUM PO Take 330 mg by mouth daily.   metoprolol succinate (TOPROL-XL) 100 MG 24 hr tablet TAKE 1 TABLET(100 MG) BY MOUTH DAILY (Patient taking differently: Take 100 mg by mouth daily.)   Omega-3 Fatty Acids (FISH OIL) 1000 MG CAPS Take 1,000 mg by mouth 3 (three) times a week.   ondansetron (ZOFRAN) 4 MG tablet TAKE 1 TABLET(4 MG) BY MOUTH EVERY 8 HOURS AS NEEDED FOR NAUSEA OR VOMITING   pantoprazole (PROTONIX) 40 MG tablet Take '40mg'$  30 minutes before breakfast and can take additional dose before evening meal if needed.   potassium chloride SA (KLOR-CON M) 20 MEQ tablet TAKE 1 TABLET(20 MEQ) BY MOUTH DAILY.   rosuvastatin (CRESTOR) 5 MG tablet TAKE 1 TABLET(5 MG) BY MOUTH DAILY   sodium chloride (OCEAN) 0.65 % SOLN nasal spray Place 1 spray into both nostrils as needed for congestion.   topiramate (TOPAMAX) 50 MG tablet Take 1 tablet (50 mg total) by mouth at bedtime.   triamcinolone (NASACORT) 55 MCG/ACT AERO nasal inhaler Place 1 spray into the nose daily as needed (allergies).   triamcinolone cream (KENALOG) 0.1 % Apply 1 Application topically 2 (two) times daily.   triamterene-hydrochlorothiazide (MAXZIDE) 75-50 MG tablet Take 1 tablet by mouth daily.   valsartan (DIOVAN) 320 MG tablet TAKE 1 TABLET BY MOUTH EVERY DAY   vitamin E 400 UNIT capsule Take 400 Units by mouth 3 (three) times a week.   zinc gluconate 50 MG  tablet Take 50 mg by mouth daily.   No facility-administered encounter medications on file as of 11/19/2021.   Current antihyperglycemic regimen:  None. Diet controlled currently   What recent interventions/DTPs have been made to improve glycemic control:  Patient denied any changes in current therapy since last visit with CPP.   Have there been any recent hospitalizations or ED visits since last visit with CPP?  Patient had hospitalization and ED visits as mentioned above.  Patient denies hypoglycemic symptoms, including Pale, Sweaty, Shaky, Hungry, Nervous/irritable, and Vision changes   Patient denies hyperglycemic symptoms, including blurry vision, excessive thirst, fatigue, polyuria, and weakness   How often are you checking your blood sugar? Patient reported checking blood sugars several times a week   What are your blood sugars ranging?  Fasting: 110 Before meals:  After meals:  Bedtime:   During the week, how often does your blood glucose drop below 70?  Patient denied any readings lower than 70.  Are you checking your feet daily/regularly? Patient denied any concerns with her feet and reports checking on them regularly.  Adherence Review: Is the patient currently on a STATIN medication? Yes Is the patient currently on ACE/ARB medication? Yes Does the patient have >5 day gap between last estimated fill dates? No    Care Gaps   AWV: done 02/05/21 Colonoscopy: done 04/15/21 DM Eye Exam: due 12/11/20 DM Foot Exam: due 12/10/18 Microalbumin: done 01/20/21  HbgAIC: done 11/05/21 (6.1) DEXA: done 03/05/21 Mammogram: done 12/23/20     Star Rating Drugs: Valsartan (DIOVAN) 320 MG tablet - last filled 10/23/21 90 days  Rosuvastatin (CRESTOR) 5 MG tablet - last filled 09/16/21 90 days    Future Appointments  Date Time Provider [place]  12/24/2021  8:00 AM [facility] PHARMACIST [facility]  01/14/2022  9:30 AM Rondel Jumbo, PA-C [facility] None       Jobe Gibbon, [facility] Clinical Pharmacist Assistant  [phone]

## 2021-12-10 ENCOUNTER — Ambulatory Visit (INDEPENDENT_AMBULATORY_CARE_PROVIDER_SITE_OTHER): Payer: Medicare Other | Admitting: Family Medicine

## 2021-12-10 VITALS — BP 160/66 | HR 64 | Temp 98.3°F | Ht 66.0 in | Wt 169.0 lb

## 2021-12-10 DIAGNOSIS — I10 Essential (primary) hypertension: Secondary | ICD-10-CM

## 2021-12-10 MED ORDER — AZELASTINE-FLUTICASONE 137-50 MCG/ACT NA SUSP
2.0000 | Freq: Every day | NASAL | 11 refills | Status: DC
Start: 2021-12-10 — End: 2023-02-16

## 2021-12-10 NOTE — Progress Notes (Signed)
Subjective:    Patient ID: [name], female    DOB: [birth year], 66 y.o.   MRN: [id number]  Since I last saw the patient, she stopped alprazolam and started hydroxyzine instead.  Her blood pressure today is elevated however she has been checking it multiple times a day over the last month at home and her blood pressure is typically between 110 and 130/60-80.  She will have a random blood pressure that is extremely high but this is exceedingly rare.  I believe that anxiety likely causes her blood pressure to fluctuate wildly.  More than 90% of her blood pressures are well controlled at home and she has checked over 50 values in the last month.  She reports both ears stopping up.  She reports postnasal drip and a scratchy sensation in her throat despite taking Nasacort intermittently and Claritin on a daily basis.  On examination today both auditory canals are clear.  There is no drainage or swelling.  There is no middle ear effusion.  Sinus passages appear clear although turbinates are swollen.  There is no erythema or exudate in the posterior oropharynx. Past Medical History:  Diagnosis Date   Abdominal aortic aneurysm (AAA) 3.0 cm to 5.0 cm in diameter in female [facility])    None seen on prior imaging?    Anxiety    Cataract    Chronic left shoulder pain    Headache    Hiatal hernia    small   Hyperlipidemia    Hypertension    Pre-diabetes    Schatzki's ring    Seasonal allergies    Tremor      Past Surgical History:  Procedure Laterality Date   CARDIAC CATHETERIZATION  2005   "Ms. Sayer has essentially normal coronary arteries and normal left ventricular function.  She will be treated empirically with anti-reflux measures"   CATARACT EXTRACTION, BILATERAL     CHOLECYSTECTOMY     COLONOSCOPY    06/16/2004   XNT:[id number] rectum/Diminutive polyps of the splenic flexure and the cecum/The remainder of the colonic mucosa appeared normal pathology (hyperplastic polyps).    COLONOSCOPY N/A 08/07/2015   Dr. Gala Romney: 6 mm descending colon tubular adenoma, multiple medium-mouthed diverticula in sigmoid colon. surveillance 2022   COLONOSCOPY N/A 04/15/2021   Procedure: COLONOSCOPY;  Surgeon: Daneil Dolin, MD;  Location: [facility];  Service: Endoscopy;  Laterality: N/A;  9:30am   ESOPHAGOGASTRODUODENOSCOPY  09/11/2001   BSW:[id number] ring otherwise normal esophagus/Normal stomach/Normal D1 and D2 status post passage of a 56 French Maloney dilator   ESOPHAGOGASTRODUODENOSCOPY N/A 11/11/2017   Procedure: ESOPHAGOGASTRODUODENOSCOPY (EGD);  Surgeon: Daneil Dolin, MD; mild Schatzki ring s/p dilation and mild erosive gastropathy.   ESOPHAGOGASTRODUODENOSCOPY (EGD) WITH ESOPHAGEAL DILATION N/A 01/25/2013   Dr. Gala Romney- schatzki's ring- 42 F dilation. small hiatal hernia   HYSTEROSCOPY WITH D & C N/A 03/06/2019   Procedure: DILATATION AND CURETTAGE /HYSTEROSCOPY;  Surgeon: Jonnie Kind, MD;  Location: [facility];  Service: Gynecology;  Laterality: N/A;   MALONEY DILATION N/A 11/11/2017   Procedure: [name] DILATION;  Surgeon: Daneil Dolin, MD;  Location: [facility];  Service: Endoscopy;  Laterality: N/A;   POLYPECTOMY N/A 03/06/2019   Procedure: POLYPECTOMY ( REMOVAL OF ENDOMETRIAL POLYP);  Surgeon: Jonnie Kind, MD;  Location: [facility];  Service: Gynecology;  Laterality: N/A;   POLYPECTOMY  04/15/2021   Procedure: POLYPECTOMY;  Surgeon: Daneil Dolin, MD;  Location: [facility];  Service: Endoscopy;;   TUBAL LIGATION  Current Outpatient Medications on File Prior to Visit  Medication Sig Dispense Refill   acetaminophen (TYLENOL) 500 MG tablet Take 500 mg by mouth every 6 (six) hours as needed for moderate pain or headache.     amLODipine (NORVASC) 10 MG tablet TAKE 1 TABLET(10 MG) BY MOUTH AT BEDTIME 90 tablet 3   aspirin EC 81 MG tablet Take 81 mg by mouth at bedtime.      Calcium 500 MG tablet Take 500 mg by mouth in the morning and at bedtime.      Carboxymethylcellul-Glycerin (CLEAR EYES FOR DRY EYES) 1-0.25 % SOLN Place 1 drop into both eyes daily as needed (dry eyes).     cholecalciferol (VITAMIN D3) 25 MCG (1000 UT) tablet Take 2,000 Units by mouth daily.     Coenzyme Q10 (COQ10) 100 MG CAPS Take 100 mg by mouth daily.     CRANBERRY PO Take 1 each by mouth 2 (two) times a week. Chewables     Cyanocobalamin (B-12) 2500 MCG TABS Take 2,500 mcg by mouth daily.     EPINEPHrine 0.3 mg/0.3 mL IJ SOAJ injection Inject 0.3 mg into the muscle as needed for anaphylaxis.      hydroquinone 4 % cream Apply 1 application topically 2 (two) times daily as needed (dark spots).     hydrOXYzine (VISTARIL) 25 MG capsule Take 1 capsule (25 mg total) by mouth every 8 (eight) hours as needed. 30 capsule 0   ipratropium (ATROVENT) 0.03 % nasal spray INSTILL 2 SPRAYS IN EACH NOSTRIL EVERY 12 HOURS 30 mL 12   linaclotide (LINZESS) 290 MCG CAPS capsule Take 1 capsule (290 mcg total) by mouth daily before breakfast. 90 capsule 3   loratadine (CLARITIN) 10 MG tablet Take 10 mg by mouth daily as needed for allergies.     MAGNESIUM PO Take 330 mg by mouth daily.     metoprolol succinate (TOPROL-XL) 100 MG 24 hr tablet TAKE 1 TABLET(100 MG) BY MOUTH DAILY (Patient taking differently: Take 100 mg by mouth daily.) 90 tablet 3   Omega-3 Fatty Acids (FISH OIL) 1000 MG CAPS Take 1,000 mg by mouth 3 (three) times a week.     ondansetron (ZOFRAN) 4 MG tablet TAKE 1 TABLET(4 MG) BY MOUTH EVERY 8 HOURS AS NEEDED FOR NAUSEA OR VOMITING 30 tablet 0   pantoprazole (PROTONIX) 40 MG tablet Take '40mg'$  30 minutes before breakfast and can take additional dose before evening meal if needed. 60 tablet 5   potassium chloride SA (KLOR-CON M) 20 MEQ tablet TAKE 1 TABLET(20 MEQ) BY MOUTH DAILY. 30 tablet 5   rosuvastatin (CRESTOR) 5 MG tablet TAKE 1 TABLET(5 MG) BY MOUTH DAILY 90 tablet 3   sodium chloride (OCEAN) 0.65 % SOLN nasal spray Place 1 spray into both nostrils as needed for  congestion.     triamcinolone (NASACORT) 55 MCG/ACT AERO nasal inhaler Place 1 spray into the nose daily as needed (allergies).     triamcinolone cream (KENALOG) 0.1 % Apply 1 Application topically 2 (two) times daily. 30 g 0   triamterene-hydrochlorothiazide (MAXZIDE) 75-50 MG tablet Take 1 tablet by mouth daily. 90 tablet 3   valsartan (DIOVAN) 320 MG tablet TAKE 1 TABLET BY MOUTH EVERY DAY 90 tablet 0   vitamin E 400 UNIT capsule Take 400 Units by mouth 3 (three) times a week.     zinc gluconate 50 MG tablet Take 50 mg by mouth daily.     ALPRAZolam (XANAX) 0.5 MG tablet TAKE 1 TABLET(0.5  MG) BY MOUTH TWICE DAILY AS NEEDED FOR ANXIETY (Patient not taking: Reported on 12/10/2021) 60 tablet 2   cloNIDine (CATAPRES) 0.1 MG tablet Take 0.1 mg by mouth See admin instructions. Take 1/2 tablet daily as needed for blood pressure (Patient not taking: Reported on 12/10/2021)     No current facility-administered medications on file prior to visit.     Allergies  Allergen Reactions   Ciprofloxacin Shortness Of Breath   Latex Other (See Comments)    Burns skin   Vitamin C [Bioflavonoid Products] Itching   Social History   Socioeconomic History   Marital status: Single    Spouse name: Not on file   Number of children: 3   Years of education: Not on file   Highest education level: Not on file  Occupational History   Occupation: English as a second language teacher: [employer]  Tobacco Use   Smoking status: Former    Packs/day: 0.25    Years: 35.00    Total pack years: 8.75    Types: Cigarettes    Quit date: 12/31/2012    Years since quitting: 8.9   Smokeless tobacco: Never  Vaping Use   Vaping Use: Never used  Substance and Sexual Activity   Alcohol use: Not Currently   Drug use: No   Sexual activity: Not Currently    Partners: Male    Birth control/protection: Post-menopausal, Surgical    Comment: tubal  Other Topics Concern   Not on file  Social History Narrative   Eats all food  groups.    Wears seatbelt.    Has 3 children.   Prior smoker.   Attends church.    Used to work in [employer].    Drives.   Enjoys going out with family.    Right handed   One story home   Social Determinants of Health   Financial Resource Strain: Low Risk  (09/21/2019)   Overall Financial Resource Strain (CARDIA)    Difficulty of Paying Living Expenses: Not very hard  Food Insecurity: Not on file  Transportation Needs: Not on file  Physical Activity: Not on file  Stress: Not on file  Social Connections: Not on file  Intimate Partner Violence: Not on file   Family History  Problem Relation Age of Onset   Diabetes Other    Diabetes Mother    Heart disease Mother    Heart disease Father    Diabetes Sister    Diabetes Brother    Hypertension Brother    Diabetes Daughter    Hypertension Maternal Grandmother    Stroke Maternal Grandfather    Early death Paternal Grandfather    Colon cancer Neg Hx    Liver disease Neg Hx       Review of Systems  Neurological:  Positive for headaches.  All other systems reviewed and are negative.      Objective:   Physical Exam Vitals reviewed.  Constitutional:      Appearance: She is well-developed.  HENT:     Right Ear: Tympanic membrane and ear canal normal.     Left Ear: Tympanic membrane and ear canal normal.     Nose:     Right Sinus: No maxillary sinus tenderness or frontal sinus tenderness.     Left Sinus: No maxillary sinus tenderness or frontal sinus tenderness.  Eyes:     General: No visual field deficit.    Extraocular Movements:     Right eye: Normal extraocular motion and no nystagmus.  Left eye: Normal extraocular motion and no nystagmus.     Conjunctiva/sclera: Conjunctivae normal.     Pupils:     Right eye: Pupil is round and reactive.     Left eye: Pupil is round and reactive.  Cardiovascular:     Rate and Rhythm: Normal rate and regular rhythm.     Heart sounds: Normal heart sounds. No murmur heard.     No friction rub. No gallop.  Pulmonary:     Effort: Pulmonary effort is normal. No respiratory distress.     Breath sounds: Normal breath sounds. No stridor. No wheezing or rales.  Abdominal:     General: Abdomen is flat. Bowel sounds are normal. There is no distension.     Palpations: Abdomen is soft.     Tenderness: There is no abdominal tenderness. There is no guarding.  Musculoskeletal:     Right hand: Normal. No swelling, deformity, tenderness or bony tenderness. Normal range of motion.     Left hand: Normal. No swelling, deformity, tenderness or bony tenderness. Normal range of motion.     Cervical back: Normal range of motion. No rigidity.  Neurological:     Mental Status: She is alert and oriented to person, place, and time. Mental status is at baseline.     Cranial Nerves: No cranial nerve deficit, dysarthria or facial asymmetry.     Sensory: No sensory deficit.     Motor: Tremor present. No weakness or abnormal muscle tone.     Coordination: Romberg sign negative. Coordination normal.     Gait: Gait normal.           Assessment & Plan:  Essential hypertension Blood pressures at home are well controlled.  Therefore I will make no changes in her antihypertensive.  Patient states that she does not feel like the hydroxyzine is working as well for her anxiety.  Therefore I will give the option to her whether she wants to resume alprazolam however at the present time she is uncertain.  I do not see an explanation for the symptoms that sound like eustachian tube dysfunction and postnasal drip.  Her exam today is relatively normal.  I recommended discontinuation of Nasacort and trying Dymista 2 sprays each nostril daily.  Continue Claritin.  For the time being we will continue hydroxyzine and asked the patient to continue to hold alprazolam

## 2021-12-14 NOTE — Telephone Encounter (Signed)
Patient was seen on 12/10/21

## 2021-12-17 NOTE — Progress Notes (Signed)
Chronic Care Management Pharmacy Note  12/31/2021 Name:  [name] MRN:  [id number] DOB:  August 09, 1955  Summary: PharmD FU.  Patient has stopped her amlodipine due to unwanted side effects.  BP has been controlled at home per her reports.  She was having leg pain and tiredness.  Only has stopped taking x 3 days.  Recommendations/Changes made from today's visit: Monitor BP well could resume at lower dose if patient willing and BP uncontrolled Also considering comorbid conditions - increase statin to Crestor 58m ASCVD risk 29.4%.  Plan: CMA to FU in 2 weeks PharmD FU in 30 days   Subjective: Kimberly KUBAis an 66y.o. year old female who is a primary patient of Pickard, WCammie Mcgee MD.  The CCM team was consulted for assistance with disease management and care coordination needs.    Engaged with patient by telephone for follow up visit in response to provider referral for pharmacy case management and/or care coordination services.   Consent to Services:  The patient was given the following information about Chronic Care Management services today, agreed to services, and gave verbal consent: 1. CCM service includes personalized support from designated clinical staff supervised by the primary care provider, including individualized plan of care and coordination with other care providers 2. 24/7 contact phone numbers for assistance for urgent and routine care needs. 3. Service will only be billed when office clinical staff spend 20 minutes or more in a month to coordinate care. 4. Only one practitioner may furnish and bill the service in a calendar month. 5.The patient may stop CCM services at any time (effective at the end of the month) by phone call to the office staff. 6. The patient will be responsible for cost sharing (co-pay) of up to 20% of the service fee (after annual deductible is met). Patient agreed to services and consent obtained.  Patient Care Team: PSusy Frizzle MD as PCP - General (Family Medicine) NJosue Hector MD as PCP - Cardiology (Cardiology) RGala RomneyRCristopher Estimable MD as Consulting Physician (Gastroenterology) DEdythe Clarity RSout[facility] LLCas Pharmacist (Pharmacist)  Recent office visits:  10/29/21 WJenna Luo MD - Family Medicine - Anxiety - hydrOXYzine (VISTARIL) 25 MG capsule, triamcinolone (NASACORT) 55 MCG/ACT AERO nasal inhaler, triamcinolone cream (KENALOG) 0.1 % prescribed. Follow up as scheduled.    09/29/21 WJenna Luo MD - Family Medicine - Unilateral headache - gabapentin (NEURONTIN) 300 MG capsule prescribed. Follow up as scheduled.    09/08/21 WJenna Luo MD - Family Medicine - Lumbar radiculopathy - MRI of Lumbar spine ordered. celecoxib (CELEBREX) 200 MG capsule prescribed. Referred for epidural injections. Follow up as scheduled.      Recent consult visits:  10/12/21 SSharene Butters PA-C - Neurology - Essential tremor - topiramate (TOPAMAX) 50 MG tablet prescribed. Discontinue Gabapentin. Follow up in 3 months.      Hospital visits: 10/02/21 - 10/03/21 Medication Reconciliation was completed by comparing discharge summary, patient's EMR and Pharmacy list, and upon discussion with patient.   Admitted to the hospital on 10/02/21 due to TIA. Discharge date was 10/03/21[facility]    Medication Changes at Hospital Discharge: None noted.   Medications Discontinued at Hospital Discharge: None noted.    Medications that remain the same after Hospital Discharge:??  All other medications will remain the same.    Hospital visits: 09/09/21 Medication Reconciliation was completed by comparing discharge summary,  patient's EMR and Pharmacy list, and upon discussion with patient.   Admitted to the hospital on 09/09/21 due to Headache. Discharge date was 09/09/21. Discharged from [place]?Medications Started at [facility]  Discharge:?? meloxicam (MOBIC) 15 MG tablet   Medication Changes at Hospital Discharge: None noted.   Medications Discontinued at Hospital Discharge: None noted.    Medications that remain the same after Hospital Discharge:??  All other medications will remain the same.    Hospital visits: 07/08/21 - 07/09/21 Medication Reconciliation was completed by comparing discharge summary, patient's EMR and Pharmacy list, and upon discussion with patient.   Admitted to the hospital on 07/08/21 due to Hypertension. Discharge date was 07/09/21. Discharged from [place]?Medications Started at [facility] Discharge:?? None noted.    Medication Changes at Hospital Discharge: None noted.   Medications Discontinued at Hospital Discharge: None noted.    Medications that remain the same after Hospital Discharge:??  All other medications will remain the same.    Objective:  Lab Results  Component Value Date   CREATININE 0.88 10/02/2021   BUN 11 10/02/2021   EGFR 78 05/18/2021   GFRNONAA >60 10/02/2021   GFRAA 74 06/10/2020   NA 135 10/02/2021   K 4.1 10/02/2021   CALCIUM 9.8 10/02/2021   CO2 26 10/02/2021   GLUCOSE 105 (H) 10/02/2021    Lab Results  Component Value Date/Time   HGBA1C 6.1 (H) 11/05/2021 08:19 AM   HGBA1C 5.9 (H) 01/20/2021 10:08 AM   MICROALBUR 0.5 01/20/2021 10:08 AM   MICROALBUR 0.2 09/13/2019 02:39 PM    Last diabetic Eye exam:  Lab Results  Component Value Date/Time   HMDIABEYEEXA No Retinopathy 12/12/2019 04:42 PM    Last diabetic Foot exam: No results found for: "HMDIABFOOTEX"   Lab Results  Component Value Date   CHOL 163 01/20/2021   HDL 60 01/20/2021   LDLCALC 89 01/20/2021   TRIG 62 01/20/2021   CHOLHDL 2.7 01/20/2021       Latest Ref Rng & Units 10/02/2021    5:35 PM 05/18/2021    3:46 PM 03/19/2021    9:26 AM  Hepatic Function  Total Protein 6.5 - 8.1 g/dL 7.2  7.0  6.8   Albumin 3.5 - 5.0 g/dL 4.2     AST 15 - 41 U/L _0 ALT 0 - 44 U/L _1 Alk Phosphatase 38 - 126 U/L 72     Total Bilirubin 0.3 - 1.2 mg/dL 0.8  0.8  0.7     Lab Results  Component Value Date/Time   TSH 1.84 10/29/2019 11:57 AM   TSH 2.11 06/27/2018 08:48 AM       Latest Ref Rng & Units 10/02/2021    5:35 PM 09/09/2021    3:19 AM 07/08/2021    3:39 PM  CBC  WBC 4.0 - 10.5 K/uL 7.5  8.0  8.0   Hemoglobin 12.0 - 15.0 g/dL 13.2  12.6  13.4   Hematocrit 36.0 - 46.0 % 40.1  38.9  40.9   Platelets 150 - 400 K/uL 247  270  335     Lab Results  Component Value Date/Time   VD25OH 38 01/20/2021 10:08 AM    Clinical ASCVD: Yes  The 10-year ASCVD risk score (Arnett DK, et al., 2019) is: 29.4%   Values used to calculate the score:  Age: 86 years     Sex: Female     Is Non-Hispanic African American: Yes     Diabetic: Yes     Tobacco smoker: No     Systolic Blood Pressure: 373 mmHg     Is BP treated: Yes     HDL Cholesterol: 60 mg/dL     Total Cholesterol: 163 mg/dL       02/05/2021   10:38 AM 10/28/2020   12:14 PM 10/29/2019   11:36 AM  Depression screen PHQ 2/9  Decreased Interest 0 0 0  Down, Depressed, Hopeless 0 0 0  PHQ - 2 Score 0 0 0     Social History   Tobacco Use  Smoking Status Former   Packs/day: 0.25   Years: 35.00   Total pack years: 8.75   Types: Cigarettes   Quit date: 12/31/2012   Years since quitting: 9.0  Smokeless Tobacco Never   BP Readings from Last 3 Encounters:  12/10/21 (!) 160/66  10/29/21 (!) 164/70  10/12/21 139/83   Pulse Readings from Last 3 Encounters:  12/10/21 64  10/29/21 68  10/12/21 64   Wt Readings from Last 3 Encounters:  12/10/21 169 lb (76.7 kg)  10/29/21 168 lb (76.2 kg)  10/12/21 166 lb (75.3 kg)   BMI Readings from Last 3 Encounters:  12/10/21 27.28 kg/m  10/29/21 27.12 kg/m  10/12/21 26.79 kg/m    Assessment/Interventions: Review of patient past medical history, allergies, medications, health status, including review of consultants  reports, laboratory and other test data, was performed as part of comprehensive evaluation and provision of chronic care management services.   SDOH:  (Social Determinants of Health) assessments and interventions performed: Yes  Financial Resource Strain: Low Risk  (12/31/2021)   Overall Financial Resource Strain (CARDIA)    Difficulty of Paying Living Expenses: Not very hard    SDOH Screenings   Alcohol Screen: Low Risk  (10/28/2020)   Alcohol Screen    Last Alcohol Screening Score (AUDIT): 0  Depression (PHQ2-9): Low Risk  (02/05/2021)   Depression (PHQ2-9)    PHQ-2 Score: 0  Financial Resource Strain: Low Risk  (12/31/2021)   Overall Financial Resource Strain (CARDIA)    Difficulty of Paying Living Expenses: Not very hard  Food Insecurity: Not on file  Housing: Not on file  Physical Activity: Not on file  Social Connections: Not on file  Stress: Not on file  Tobacco Use: Medium Risk (10/12/2021)   Patient History    Smoking Tobacco Use: Former    Smokeless Tobacco Use: Never    Passive Exposure: Not on file  Transportation Needs: Not on file    [place]  Allergies  Allergen Reactions   Ciprofloxacin Shortness Of Breath   Latex Other (See Comments)    Burns skin   Vitamin C [Bioflavonoid Products] Itching    Medications Reviewed Today     Reviewed by Edythe Clarity, [facility] (Pharmacist) on 12/31/21 at [phone]  Med List Status: <None>   Medication Order Taking? Sig Documenting Provider Last Dose Status Informant  acetaminophen (TYLENOL) 500 MG tablet [id number] No Take 500 mg by mouth every 6 (six) hours as needed for moderate pain or headache. [provider] Taking Active Self, Pharmacy Records  ALPRAZolam Duanne Moron) 0.5 MG tablet [id number] No TAKE 1 TABLET(0.5 MG) BY MOUTH TWICE DAILY AS NEEDED FOR ANXIETY  Patient not taking: Reported on 12/10/2021   Susy Frizzle, MD Not Taking Active   amLODipine (NORVASC) 10 MG  tablet [id number] No TAKE 1 TABLET(10 MG) BY  MOUTH AT BEDTIME Susy Frizzle, MD Taking Active   aspirin EC 81 MG tablet [id number] No Take 81 mg by mouth at bedtime.  [provider] Taking Active Self, Pharmacy Records  Azelastine-Fluticasone [facility]) 137-50 MCG/ACT SUSP [id number]  Place 2 sprays into the nose daily. Susy Frizzle, MD  Active   Calcium 500 MG tablet [id number] No Take 500 mg by mouth in the morning and at bedtime. [provider] Taking Active Self, Pharmacy Records  Carboxymethylcellul-Glycerin (CLEAR EYES FOR DRY EYES) 1-0.25 % SOLN [id number] No Place 1 drop into both eyes daily as needed (dry eyes). [provider] Taking Active Self, Pharmacy Records  cholecalciferol (VITAMIN D3) 25 MCG (1000 UT) tablet [id number] No Take 2,000 Units by mouth daily. [provider] Taking Active Self, Pharmacy Records  cloNIDine (CATAPRES) 0.1 MG tablet [id number] No Take 0.1 mg by mouth See admin instructions. Take 1/2 tablet daily as needed for blood pressure  Patient not taking: Reported on 12/10/2021   [provider] Not Taking Active Self, Pharmacy Records  Coenzyme Q10 (COQ10) 100 MG CAPS [id number] No Take 100 mg by mouth daily. [provider] Taking Active Self, Pharmacy Records  CRANBERRY PO [id number] No Take 1 each by mouth 2 (two) times a week. Chewables [provider] Taking Active Self, Pharmacy Records  Cyanocobalamin (B-12) 2500 MCG TABS [id number] No Take 2,500 mcg by mouth daily. [provider] Taking Active Self, Pharmacy Records  EPINEPHrine 0.3 mg/0.3 mL IJ SOAJ injection [id number] No Inject 0.3 mg into the muscle as needed for anaphylaxis.  [provider] Taking Active Self, Pharmacy Records  hydroquinone 4 % cream [id number] No Apply 1 application topically 2 (two) times daily as needed (dark spots). [provider] Taking Active Self, Pharmacy Records  hydrOXYzine (VISTARIL) 25 MG capsule [id number] No Take 1 capsule (25 mg  total) by mouth every 8 (eight) hours as needed. Susy Frizzle, MD Taking Active   ipratropium (ATROVENT) 0.03 % nasal spray [id number] No INSTILL 2 SPRAYS IN EACH NOSTRIL EVERY 12 HOURS Pickard, Cammie Mcgee, MD Taking Active Self, Pharmacy Records  linaclotide [facility]) 290 MCG CAPS capsule [id number] No Take 1 capsule (290 mcg total) by mouth daily before breakfast. Mahala Menghini, PA-C Taking Active Self, Pharmacy Records  loratadine (CLARITIN) 10 MG tablet [id number] No Take 10 mg by mouth daily as needed for allergies. [provider] Taking Active Self, Pharmacy Records  MAGNESIUM PO [id number] No Take 330 mg by mouth daily. [provider] Taking Active Self, Pharmacy Records  metoprolol succinate (TOPROL-XL) 100 MG 24 hr tablet [id number] No TAKE 1 TABLET(100 MG) BY MOUTH DAILY  Patient taking differently: Take 100 mg by mouth daily.   Susy Frizzle, MD Taking Active Self, Pharmacy Records  Omega-3 Fatty Acids (FISH OIL) 1000 MG CAPS [id number] No Take 1,000 mg by mouth 3 (three) times a week. [provider] Taking Active Self, Pharmacy Records  ondansetron (ZOFRAN) 4 MG tablet [id number] No TAKE 1 TABLET(4 MG) BY MOUTH EVERY 8 HOURS AS NEEDED FOR NAUSEA OR VOMITING Mahala Menghini, PA-C Taking Active Self, Pharmacy Records  pantoprazole (PROTONIX) 40 MG tablet [id number] No Take 2m 30 minutes before breakfast and can take additional dose before evening meal if needed. LMahala Menghini PA-C Taking Active   potassium chloride SA (KLOR-CON M) 20 MEQ tablet [id number] TAKE 1 TABLET(20 MEQ) BY MOUTH DAILY. PSusy Frizzle MD Taking  Active   rosuvastatin (CRESTOR) 5 MG tablet [id number] No TAKE 1 TABLET(5 MG) BY MOUTH DAILY Pickard, Cammie Mcgee, MD Taking Active   sodium chloride (OCEAN) 0.65 % SOLN nasal spray [id number] No Place 1 spray into both nostrils as needed for congestion. [provider] Taking Active Self, Pharmacy Records  triamcinolone (NASACORT) 55  MCG/ACT AERO nasal inhaler [id number] No Place 1 spray into the nose daily as needed (allergies). [provider] Taking Active Self, Pharmacy Records  triamcinolone cream (KENALOG) 0.1 % [id number] No Apply 1 Application topically 2 (two) times daily. Susy Frizzle, MD Taking Active   triamterene-hydrochlorothiazide [facility]) 75-50 MG tablet [id number] No Take 1 tablet by mouth daily. Susy Frizzle, MD Taking Active Self, Pharmacy Records  valsartan (DIOVAN) 320 MG tablet [id number] No TAKE 1 TABLET BY MOUTH EVERY DAY Susy Frizzle, MD Taking Active   vitamin E 400 UNIT capsule [id number] No Take 400 Units by mouth 3 (three) times a week. [provider] Taking Active Self, Pharmacy Records  zinc gluconate 50 MG tablet [id number] No Take 50 mg by mouth daily. [provider] Taking Active Self, Pharmacy Records            Patient Active Problem List   Diagnosis Date Noted   Frequent headaches 10/12/2021   Non-seasonal allergic rhinitis 05/19/2021   Pulsatile tinnitus of left ear 05/19/2021   Hyperlipidemia    Hypertension    Abdominal aortic aneurysm (AAA) 3.0 cm to 5.0 cm in diameter in female [facility])    Nausea without vomiting 12/06/2018   Loss of weight 12/06/2018   Controlled diabetes mellitus type 2 with complications ([place]) 94/85/4627   Essential hypertension 04/25/2017   Hyperlipidemia LDL goal <70 04/25/2017   Carotid artery stenosis without cerebral infarction, bilateral 04/25/2017   AAA (abdominal aortic aneurysm) without rupture ([place]) 04/25/2017   History of colonic polyps    Diverticulosis of colon without hemorrhage    RUQ pain 02/21/2013   Abdominal pain, epigastric 01/05/2013   Abdominal bruit 01/05/2013   Esophageal dysphagia 01/05/2013   Constipation 01/05/2013   GERD (gastroesophageal reflux disease) 01/05/2013    Immunization History  Administered Date(s) Administered   Fluad Quad(high Dose 65+) 02/14/2020, 02/05/2021    Influenza,inj,Quad PF,6+ Mos 02/05/2017, 02/23/2018, 02/20/2019   Moderna Sars-Covid-2 Vaccination 10/10/2019, 11/08/2019, 05/19/2020   Pneumococcal Conjugate-13 07/26/2017   Pneumococcal Polysaccharide-23 02/14/2020   Tdap 03/28/2017    Conditions to be addressed/monitored:  Lendell Caprice, DM  Care Plan : General Pharmacy (Adult)  Updates made by Edythe Clarity, RPH since 12/31/2021 12:00 AM     Problem: HTN, HLD   Priority: High  Onset Date: 12/31/2021     Long-Range Goal: Patient-Specific Goal   Start Date: 12/31/2021  Expected End Date: 07/03/2022  This Visit's Progress: On track  Priority: High  Note:   Current Barriers:  Unable to achieve control of BP  Suboptimal therapeutic regimen for lipids  Pharmacist Clinical Goal(s):  Patient will achieve improvement in BP and LDL as evidenced by labs/monitoring through collaboration with PharmD and provider.   Interventions: 1:1 collaboration with Susy Frizzle, MD regarding development and update of comprehensive plan of care as evidenced by provider attestation and co-signature Inter-disciplinary care team collaboration (see longitudinal plan of care) Comprehensive medication review performed; medication list updated in electronic medical record  Hypertension (BP goal <130/80) 12/31/21 -Query controlled, elevated in office - normal at home (hx of white coat syndrome) -Current treatment: Valsartan 364m Appropriate, Effective, Safe, Accessible Metoprolol  XL 163m Appropriate, Effective, Safe, Accessible Triamterene/HCTZ 75/524mdaily Appropriate, Query effective, ,  Amlodipine 1012mppropriate, Query effective, ,  Clonidine 0.1mg39mn elevated BP Appropriate, Effective, Safe, Accessible -Medications previously tried: none noted  -Current home readings: 113/65 around these averages at home -Current dietary habits: same see previous -Current exercise habits: same see previous -Denies hypotensive/hypertensive symptoms -Educated  on BP goals and benefits of medications for prevention of heart attack, stroke and kidney damage; Daily salt intake goal < 2300 mg; Exercise goal of 150 minutes per week; Importance of home blood pressure monitoring; Symptoms of hypotension and importance of maintaining adequate hydration; -Counseled to monitor BP at home a few times per week, document, and provide log at future appointments -She is not taking her amlodipine because she feels it makes her leg hurt and makes her feel tired.  She has not taken for a few days and BP remains controlled at home.  I have asked her to monitor really well for a few days and let us kKoreaw if BP spikes.  Will have CMA follow up with her in two weeks to see how her BP is doing.   No changes to BP meds at this time - BP controlled, patient with white coat syndrome.  Hyperlipidemia: (LDL goal < 70) 12/31/21 -Uncontrolled, last lipid panel LDL elevated -Current treatment: Rosuvastatin 5mg 36mropriate, Query effective, ,  -Medications previously tried: none noted  -Patient with carotid artery stenosis, previous symptoms of TIA. ASVCD risk 29.4% - would recommend increase in her statin dose.  -Educated on Cholesterol goals;  Benefits of statin for ASCVD risk reduction; Importance of limiting foods high in cholesterol; -Recommended we increase her statin to 10mg 36mll consult with PCP.   Patient Goals/Self-Care Activities Patient will:  - take medications as prescribed as evidenced by patient report and record review check blood pressure a few times per week, document, and provide at future appointments  Follow Up Plan: The care management team will reach out to the patient again over the next 30 days.        Medication Assistance: None required.  Patient affirms current coverage meets needs.  Compliance/Adherence/Medication fill history: Care Gaps: Eye exam, DEXA Scan, Foot exam  Star-Rating Drugs: Rosuvastatin 5mg 0826m/23 90ds Valsartan  320mg 0645m23 90ds  Patient's preferred pharmacy is:  [id number] - [place], [place] - [phone] [address][id number] [id number] [place] [phone] and Follow Up Patient Decision:  Patient agrees to Care Plan and Follow-up.  Plan: The care management team will reach out to the patient again over the next 30 days.  ChristianBeverly Milch CPP Clinical Pharmacist Practitioner [place] [phone]

## 2021-12-24 ENCOUNTER — Telehealth: Payer: Medicare Other

## 2021-12-24 ENCOUNTER — Other Ambulatory Visit: Payer: Self-pay | Admitting: *Deleted

## 2021-12-24 NOTE — Patient Outreach (Signed)
  Care Coordination   12/24/2021 Name: [name] MRN: [id number] DOB: 1956/02/15   Care Coordination Outreach Attempts:  An unsuccessful telephone outreach was attempted today to offer the patient information about available care coordination services as a benefit of their health plan.   Follow Up Plan:  Additional outreach attempts will be made to offer the patient care coordination information and services.   Encounter Outcome:  No Answer  Care Coordination Interventions Activated:  Yes   Care Coordination Interventions:  No, not indicated    [facility] [phone]

## 2021-12-31 ENCOUNTER — Ambulatory Visit: Payer: Medicare Other | Admitting: Pharmacist

## 2021-12-31 DIAGNOSIS — E785 Hyperlipidemia, unspecified: Secondary | ICD-10-CM

## 2021-12-31 DIAGNOSIS — I10 Essential (primary) hypertension: Secondary | ICD-10-CM

## 2021-12-31 NOTE — Patient Instructions (Addendum)
Visit Information   Goals Addressed             This Visit's Progress    Track and Manage My Blood Pressure-Hypertension       Timeframe:  Long-Range Goal Priority:  High Start Date:    12/31/21                         Expected End Date:    07/03/22                   Follow Up Date 04/02/22    - check blood pressure daily    Why is this important?   You won't feel high blood pressure, but it can still hurt your blood vessels.  High blood pressure can cause heart or kidney problems. It can also cause a stroke.  Making lifestyle changes like losing a little weight or eating less salt will help.  Checking your blood pressure at home and at different times of the day can help to control blood pressure.  If the doctor prescribes medicine remember to take it the way the doctor ordered.  Call the office if you cannot afford the medicine or if there are questions about it.     Notes:        Patient Care Plan: General Pharmacy (Adult)     Problem Identified: HTN, HLD   Priority: High  Onset Date: 12/31/2021     Long-Range Goal: Patient-Specific Goal   Start Date: 12/31/2021  Expected End Date: 07/03/2022  This Visit's Progress: On track  Priority: High  Note:   Current Barriers:  Unable to achieve control of BP  Suboptimal therapeutic regimen for lipids  Pharmacist Clinical Goal(s):  Patient will achieve improvement in BP and LDL as evidenced by labs/monitoring through collaboration with PharmD and provider.   Interventions: 1:1 collaboration with Susy Frizzle, MD regarding development and update of comprehensive plan of care as evidenced by provider attestation and co-signature Inter-disciplinary care team collaboration (see longitudinal plan of care) Comprehensive medication review performed; medication list updated in electronic medical record  Hypertension (BP goal <130/80) 12/31/21 -Query controlled, elevated in office - normal at home (hx of white coat  syndrome) -Current treatment: Valsartan '320mg'$  Appropriate, Effective, Safe, Accessible Metoprolol XL '100mg'$  Appropriate, Effective, Safe, Accessible Triamterene/HCTZ 75/'50mg'$  daily Appropriate, Query effective, ,  Amlodipine '10mg'$  Appropriate, Query effective, ,  Clonidine 0.'1mg'$  prn elevated BP Appropriate, Effective, Safe, Accessible -Medications previously tried: none noted  -Current home readings: 113/65 around these averages at home -Current dietary habits: same see previous -Current exercise habits: same see previous -Denies hypotensive/hypertensive symptoms -Educated on BP goals and benefits of medications for prevention of heart attack, stroke and kidney damage; Daily salt intake goal < 2300 mg; Exercise goal of 150 minutes per week; Importance of home blood pressure monitoring; Symptoms of hypotension and importance of maintaining adequate hydration; -Counseled to monitor BP at home a few times per week, document, and provide log at future appointments -She is not taking her amlodipine because she feels it makes her leg hurt and makes her feel tired.  She has not taken for a few days and BP remains controlled at home.  I have asked her to monitor really well for a few days and let us know if BP spikes.  Will have CMA follow up with her in two weeks to see how her BP is doing.   No changes to BP meds at this  time - BP controlled, patient with white coat syndrome.  Hyperlipidemia: (LDL goal < 70) 12/31/21 -Uncontrolled, last lipid panel LDL elevated -Current treatment: Rosuvastatin '5mg'$  Appropriate, Query effective, ,  -Medications previously tried: none noted  -Patient with carotid artery stenosis, previous symptoms of TIA. ASVCD risk 29.4% - would recommend increase in her statin dose.  -Educated on Cholesterol goals;  Benefits of statin for ASCVD risk reduction; Importance of limiting foods high in cholesterol; -Recommended we increase her statin to '10mg'$  - will consult with  PCP.   Patient Goals/Self-Care Activities Patient will:  - take medications as prescribed as evidenced by patient report and record review check blood pressure a few times per week, document, and provide at future appointments  Follow Up Plan: The care management team will reach out to the patient again over the next 30 days.        The patient verbalized understanding of instructions, educational materials, and care plan provided today and DECLINED offer to receive copy of patient instructions, educational materials, and care plan.  Telephone follow up appointment with pharmacy team member scheduled for: 2 days  MANDE AUVIL, [facility]

## 2022-01-06 ENCOUNTER — Telehealth: Payer: Self-pay | Admitting: Pharmacist

## 2022-01-06 DIAGNOSIS — E785 Hyperlipidemia, unspecified: Secondary | ICD-10-CM

## 2022-01-06 MED ORDER — ROSUVASTATIN CALCIUM 10 MG PO TABS: 10.0000 mg | ORAL_TABLET | Freq: Every day | ORAL | 3 refills | Status: DC

## 2022-01-06 NOTE — Telephone Encounter (Signed)
-----   Message from Susy Frizzle, MD sent at 12/31/2021  3:06 PM EDT ----- Sure, go ahead. ----- Message ----- From: Edythe Clarity, [facility] Sent: 12/31/2021   8:58 AM EDT To: Susy Frizzle, MD  Based on ASCVD risk, elevated LDL above goal last time, would you be ok with increasing her statin to Crestor '10mg'$  - patient was agreeable to this recommendation.

## 2022-01-06 NOTE — Chronic Care Management (AMB) (Signed)
New Rx called in for Crestor '10mg'$ .  Will shoot for goal LDL < 70. Patient to double up on her '5mg'$  tablets at home until she picks up new prescription. [name], PharmD, CPP Clinical Pharmacist Practitioner [place] [phone]

## 2022-01-14 ENCOUNTER — Ambulatory Visit: Payer: Medicare Other | Admitting: Physician Assistant

## 2022-01-14 VITALS — BP 105/75 | HR 62 | Ht 66.0 in | Wt 169.2 lb

## 2022-01-14 DIAGNOSIS — R519 Headache, unspecified: Secondary | ICD-10-CM | POA: Diagnosis not present

## 2022-01-14 DIAGNOSIS — G250 Essential tremor: Secondary | ICD-10-CM | POA: Diagnosis not present

## 2022-01-14 DIAGNOSIS — G8929 Other chronic pain: Secondary | ICD-10-CM

## 2022-01-14 DIAGNOSIS — I10 Essential (primary) hypertension: Secondary | ICD-10-CM

## 2022-01-14 MED ORDER — TOPIRAMATE 25 MG PO TABS: 25.0000 mg | ORAL_TABLET | Freq: Every evening | ORAL | 11 refills | Status: DC

## 2022-01-14 NOTE — Progress Notes (Signed)
[name] Clinic Note - Initial Visit   Date: 01/14/22  [name] MRN: [id number] DOB: 08/14/1955   History of Present Illness:  Onset: At night when sleeps, for at least 8 months "if not longer than that ".  Quality: throbbing , feels tight and pulsating  Intensity: 7 /10  Location: B temporal across forehead and sometimes in the occipital area  Duration: 20 mins  Frequency: 2x a week  Associated symptoms: nausea  Aura: wiggly lines   [facility] during that time she does not report any headaches. Aggravating factors: sounds, dreams when waking up  Relieving factors: trying to calm herself down, breathe, and tylenol and xanax   Current abortive medications: tylenol every 2-3 weeks  Current prophylactic medications: "magnesium, B6, CoQ "  Past abortive medications:  Past prophylactic medications: Topamax "tired and no energy" , dc 5 months ago    Family history: GF stroke, no family or personal history of headaches or seizures Smoker: no  Alcohol: no  Caffeine: none Sleep:  not to well, 9-mn, then 3 am till 7    CTA 09/09/2021 personally reviewed, agree with radiology: 1. No emergent finding. 2. At least 70% atheromatous narrowing of the right common carotid bifurcation and right cavernous ICA. 50% stenosis of the paraclinoid right ICA. 3. 45% narrowing at the right vertebral origin. 4. Mild intracranial branch atherosclerosis.  MRI of the brain 10/02/2021 no acute intracranial process. No evidence of acute or subacute infarct.    Initial visit 09/2021   Patient is a very pleasant 66 year old woman f initially seen at [facility] neurology for evaluation of essential tremor, currently on metoprolol,  history of hypertension, hyperlipidemia, anxiety, and a history of abdominal aortic aneurysm and prediabetes.  She recently had been seen at the ED on 10/02/2021 with complaints of significant headaches,  "jumbled words ", and slight "left hand heaviness with the headaches ".  This last month, her blood pressure has been significantly uncontrolled reaching up to 200s.  She has some floaters in her right eye, otherwise she denies any visual complaints.  She has chronic bilateral foot tingling, but there is no new areas of numbness.  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  Also seen, 45% narrowing of the right vertebral origin.  Pending on these findings, an MRI of the brain without contrast was performed, negative for stroke or other abnormality is.  It was suspected that these symptoms were due to hypertensive emergency, which had resolved at the time of neurological evaluation.  She was discharged on gabapentin 300 mg twice daily, but the patient reduced it to 300 mg at night, because of increased sleepiness.  Patient is on statin, metoprolol, amlodipine, and clonidine as needed as well. Denies any history of TIA. Denies vertigo dizziness or vision changes. Denies dysphagia. No confusion Denies any fever or chills, or night sweats. No tobacco. No new meds or hormonal supplements. Does take a regular ASA a day, with no other antiplatelets or anticoagulants.Denies any recent long distance trips or recent surgeries. No sick contacts. No new stressors present in personal life.  Patient is compliant with his medications.Patient is active. She is here in follow-up.  She is alone during this visit.  Out-side paper records, electronic medical  record, and images have been reviewed where available and summarized as:  Lab Results  Component Value Date   HGBA1C 6.1 (H) 11/05/2021   Lab Results  Component Value Date   VITAMINB12 >2,000 (H) 01/20/2021   Lab Results  Component Value Date   TSH 1.84 10/29/2019   Lab  Results  Component Value Date   ESRSEDRATE 14 06/10/2020    Past Medical History:  Diagnosis Date   Abdominal aortic aneurysm (AAA) 3.0 cm to 5.0 cm in diameter in female [facility])    None seen on prior imaging?    Anxiety    Cataract    Chronic left shoulder pain    Headache    Hiatal hernia    small   Hyperlipidemia    Hypertension    Pre-diabetes    Schatzki's ring    Seasonal allergies    Tremor     Past Surgical History:  Procedure Laterality Date   CARDIAC CATHETERIZATION  2005   "[name] has essentially normal coronary arteries and normal left ventricular function.  She will be treated empirically with anti-reflux measures"   CATARACT EXTRACTION, BILATERAL     CHOLECYSTECTOMY     COLONOSCOPY    06/16/2004   HBZ:JIRCVE rectum/Diminutive polyps of the splenic flexure and the cecum/The remainder of the colonic mucosa appeared normal pathology (hyperplastic polyps).   COLONOSCOPY N/A 08/07/2015   Dr. Gala Romney: 6 mm descending colon tubular adenoma, multiple medium-mouthed diverticula in sigmoid colon. surveillance 2022   COLONOSCOPY N/A 04/15/2021   Procedure: COLONOSCOPY;  Surgeon: Daneil Dolin, MD;  Location: [facility];  Service: Endoscopy;  Laterality: N/A;  9:30am   ESOPHAGOGASTRODUODENOSCOPY  09/11/2001   LFY:BOFBPZWCH ring otherwise normal esophagus/Normal stomach/Normal D1 and D2 status post passage of a 56 French Maloney dilator   ESOPHAGOGASTRODUODENOSCOPY N/A 11/11/2017   Procedure: ESOPHAGOGASTRODUODENOSCOPY (EGD);  Surgeon: Daneil Dolin, MD; mild Schatzki ring s/p dilation and mild erosive gastropathy.   ESOPHAGOGASTRODUODENOSCOPY (EGD) WITH ESOPHAGEAL DILATION N/A 01/25/2013   Dr. Gala Romney- schatzki's ring- 62 F dilation. small hiatal hernia   HYSTEROSCOPY WITH D & C N/A 03/06/2019   Procedure: DILATATION AND CURETTAGE /HYSTEROSCOPY;  Surgeon: Jonnie Kind, MD;  Location: [facility] ORS;  Service: Gynecology;  Laterality: N/A;   MALONEY DILATION N/A 11/11/2017    Procedure: [name] DILATION;  Surgeon: Daneil Dolin, MD;  Location: [facility];  Service: Endoscopy;  Laterality: N/A;   POLYPECTOMY N/A 03/06/2019   Procedure: POLYPECTOMY ( REMOVAL OF ENDOMETRIAL POLYP);  Surgeon: Jonnie Kind, MD;  Location: [facility] OR[facility];  Service: Gynecology;  Laterality: N/A;   POLYPECTOMY  04/15/2021   Procedure: POLYPECTOMY;  Surgeon: Daneil Dolin, MD;  Location: [facility];  Service: Endoscopy;;   TUBAL LIGATION       Medications:  Outpatient Encounter Medications as of 01/14/2022  Medication Sig   acetaminophen (TYLENOL) 500 MG tablet Take 500 mg by mouth every 6 (six) hours as needed for moderate pain or headache.   ALPRAZolam (XANAX) 0.5 MG tablet TAKE 1 TABLET(0.5 MG) BY MOUTH TWICE DAILY AS NEEDED FOR ANXIETY (Patient not taking: Reported on 12/10/2021)   amLODipine (NORVASC) 10 MG tablet TAKE 1 TABLET(10 MG) BY MOUTH AT BEDTIME   aspirin EC 81 MG tablet Take 81 mg by mouth at bedtime.    Azelastine-Fluticasone (DYMISTA) 137-50 MCG/ACT SUSP Place 2 sprays into the nose daily.   Calcium 500 MG tablet Take 500 mg by mouth in the morning and at bedtime.  Carboxymethylcellul-Glycerin (CLEAR EYES FOR DRY EYES) 1-0.25 % SOLN Place 1 drop into both eyes daily as needed (dry eyes).   cholecalciferol (VITAMIN D3) 25 MCG (1000 UT) tablet Take 2,000 Units by mouth daily.   cloNIDine (CATAPRES) 0.1 MG tablet Take 0.1 mg by mouth See admin instructions. Take 1/2 tablet daily as needed for blood pressure (Patient not taking: Reported on 12/10/2021)   Coenzyme Q10 (COQ10) 100 MG CAPS Take 100 mg by mouth daily.   CRANBERRY PO Take 1 each by mouth 2 (two) times a week. Chewables   Cyanocobalamin (B-12) 2500 MCG TABS Take 2,500 mcg by mouth daily.   EPINEPHrine 0.3 mg/0.3 mL IJ SOAJ injection Inject 0.3 mg into the muscle as needed for anaphylaxis.    hydroquinone 4 % cream Apply 1 application topically 2 (two) times daily as needed (dark spots).   hydrOXYzine (VISTARIL) 25  MG capsule Take 1 capsule (25 mg total) by mouth every 8 (eight) hours as needed.   ipratropium (ATROVENT) 0.03 % nasal spray INSTILL 2 SPRAYS IN EACH NOSTRIL EVERY 12 HOURS   linaclotide (LINZESS) 290 MCG CAPS capsule Take 1 capsule (290 mcg total) by mouth daily before breakfast.   loratadine (CLARITIN) 10 MG tablet Take 10 mg by mouth daily as needed for allergies.   MAGNESIUM PO Take 330 mg by mouth daily.   metoprolol succinate (TOPROL-XL) 100 MG 24 hr tablet TAKE 1 TABLET(100 MG) BY MOUTH DAILY (Patient taking differently: Take 100 mg by mouth daily.)   Omega-3 Fatty Acids (FISH OIL) 1000 MG CAPS Take 1,000 mg by mouth 3 (three) times a week.   ondansetron (ZOFRAN) 4 MG tablet TAKE 1 TABLET(4 MG) BY MOUTH EVERY 8 HOURS AS NEEDED FOR NAUSEA OR VOMITING   pantoprazole (PROTONIX) 40 MG tablet Take '40mg'$  30 minutes before breakfast and can take additional dose before evening meal if needed.   potassium chloride SA (KLOR-CON M) 20 MEQ tablet TAKE 1 TABLET(20 MEQ) BY MOUTH DAILY.   rosuvastatin (CRESTOR) 10 MG tablet Take 1 tablet (10 mg total) by mouth daily.   sodium chloride (OCEAN) 0.65 % SOLN nasal spray Place 1 spray into both nostrils as needed for congestion.   triamcinolone (NASACORT) 55 MCG/ACT AERO nasal inhaler Place 1 spray into the nose daily as needed (allergies).   triamcinolone cream (KENALOG) 0.1 % Apply 1 Application topically 2 (two) times daily.   triamterene-hydrochlorothiazide (MAXZIDE) 75-50 MG tablet Take 1 tablet by mouth daily.   valsartan (DIOVAN) 320 MG tablet TAKE 1 TABLET BY MOUTH EVERY DAY   vitamin E 400 UNIT capsule Take 400 Units by mouth 3 (three) times a week.   zinc gluconate 50 MG tablet Take 50 mg by mouth daily.   No facility-administered encounter medications on file as of 01/14/2022.    Allergies:  Allergies  Allergen Reactions   Ciprofloxacin Shortness Of Breath   Latex Other (See Comments)    Burns skin   Vitamin C [Bioflavonoid Products] Itching     Family History: Family History  Problem Relation Age of Onset   Diabetes Other    Diabetes Mother    Heart disease Mother    Heart disease Father    Diabetes Sister    Diabetes Brother    Hypertension Brother    Diabetes Daughter    Hypertension Maternal Grandmother    Stroke Maternal Grandfather    Early death Paternal Grandfather    Colon cancer Neg Hx    Liver disease Neg Hx  Social History: Social History   Tobacco Use   Smoking status: Former    Packs/day: 0.25    Years: 35.00    Total pack years: 8.75    Types: Cigarettes    Quit date: 12/31/2012    Years since quitting: 9.0   Smokeless tobacco: Never  Vaping Use   Vaping Use: Never used  Substance Use Topics   Alcohol use: Not Currently   Drug use: No   Social History   Social History Narrative   Eats all food groups.    Wears seatbelt.    Has 3 children.   Prior smoker.   Attends church.    Used to work in [employer].    Drives.   Enjoys going out with family.    Right handed   One story home    Vital Signs:  BP 105/75   Pulse 62   Ht '5\' 6"'$  (1.676 m)   Wt 169 lb 3.2 oz (76.7 kg)   SpO2 94%   BMI 27.31 kg/m    General Medical Exam:   General:  Well appearing, comfortable.   Eyes/ENT: see cranial nerve examination.   Neck:   No carotid bruits. Respiratory:  Clear to auscultation, good air entry bilaterally.   Cardiac:  Regular rate and rhythm, no murmur.   Extremities:  No deformities, edema, or skin discoloration.  Skin:  No rashes or lesions.  Neurological Exam: MENTAL STATUS including orientation to time, place, person, recent and remote memory, attention span and concentration, language, and fund of knowledge is normal.  Speech is not dysarthric.  CRANIAL NERVES: II:  No visual field defects.  Unremarkable fundi.   III-IV-VI: Pupils equal round and reactive to light.  Normal conjugate, extra-ocular eye movements in all directions of gaze.  No nystagmus.  No ptosis.   V:   Normal facial sensation.    VII:  Normal facial symmetry and movements.   VIII:  Normal hearing and vestibular function.   IX-X:  Normal palatal movement.   XI:  Normal shoulder shrug and head rotation.   XII:  Normal tongue strength and range of motion, no deviation or fasciculation.  MOTOR:  No atrophy, fasciculations. R hand >L tremor, worse on intention, no head tremor.  No pronator drift. No cogwheeling   SENSORY:  Normal and symmetric perception of light touch, pinprick, vibration, and proprioception.  Romberg's sign absent.   COORDINATION/GAIT: Normal finger-to- nose-finger and heel-to-shin.  Intact rapid alternating movements bilaterally.  Able to rise from a chair without using arms.  Gait narrow based and stable. Tandem and stressed gait intact.    IMPRESSION/PLAN:  Headaches, likely due to uncontrollable hypertension. 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  Also seen, 45% narrowing of the right vertebral origin.   MRI of the brain 10/02/2021 no acute intracranial process. No evidence of acute or subacute infarct.    For abortive therapy, Topamax 25 mg qhs per patient's request wants to decrease the dose   Limit use of pain relievers to no more than 2 days out of week to prevent risk of rebound or medication-overuse headache.  Keep headache diary  Exercise, hydration, caffeine cessation, sleep hygiene, monitor for and avoid triggers Continue magnesium citrate '400mg'$  daily, riboflavin '400mg'$  daily, and coenzyme Q10 '100mg'$  three times daily Strong monitor  of Blood pressure with current med regimen  as per  Cards and PCP Follow up in 6 months    Essential Tremors No significant changes from prior visit  Continue metoprolol    Total time spent: 25 min   Thank you for allowing me to  participate in patient's care.  If I can answer any additional questions, I would be pleased to do so.    Sincerely,   Sharene Butters, PA-C

## 2022-01-14 NOTE — Patient Instructions (Addendum)
  Start  Topamax 25 mg nightly Continue metoprolol for BP and for tremors  Follow up in 6 months  Continue the CoQ, B6 and Mag    Limit use of pain relievers to no more than 2 days out of the week.  These medications include acetaminophen, NSAIDs (ibuprofen/Advil/Motrin, naproxen/Aleve, triptans (Imitrex/sumatriptan), Excedrin, and narcotics.  This will help reduce risk of rebound headaches. Be aware of common food triggers:  - Caffeine:  coffee, black tea, cola, Mt. Dew  - Chocolate  - Dairy:  aged cheeses (brie, blue, cheddar, gouda, [place], provolone, [place], Swiss, etc), chocolate milk, buttermilk, sour cream, limit eggs and yogurt  - Nuts, peanut butter  - Alcohol  - Cereals/grains:  FRESH breads (fresh bagels, sourdough, doughnuts), yeast productions  - Processed/canned/aged/cured meats (pre-packaged deli meats, hotdogs)  - MSG/glutamate:  soy sauce, flavor enhancer, pickled/preserved/marinated foods  - Sweeteners:  aspartame (Equal, Nutrasweet).  Sugar and Splenda are okay  - Vegetables:  legumes (lima beans, lentils, snow peas, fava beans, pinto peans, peas, garbanzo beans), sauerkraut, onions, olives, pickles  - Fruit:  avocados, bananas, citrus fruit (orange, lemon, grapefruit), mango  - Other:  Frozen meals, macaroni and cheese Routine exercise Stay adequately hydrated (aim for 64 oz water daily) Keep headache diary Maintain proper stress management Maintain proper sleep hygiene Do not skip meals Consider supplements:  magnesium citrate '400mg'$  daily, riboflavin '400mg'$  daily, coenzyme Q10 '100mg'$  three times daily. 50 25

## 2022-01-17 ENCOUNTER — Other Ambulatory Visit: Payer: Self-pay | Admitting: Family Medicine

## 2022-01-19 ENCOUNTER — Emergency Department (HOSPITAL_COMMUNITY): Payer: Medicare Other

## 2022-01-19 ENCOUNTER — Telehealth: Payer: Self-pay | Admitting: Pharmacist

## 2022-01-19 ENCOUNTER — Encounter (HOSPITAL_COMMUNITY): Payer: Self-pay | Admitting: Emergency Medicine

## 2022-01-19 ENCOUNTER — Emergency Department (HOSPITAL_COMMUNITY)
Admission: EM | Admit: 2022-01-19 | Discharge: 2022-01-20 | Disposition: A | Discharge status: Home / Self Care | Payer: Medicare Other | Attending: Emergency Medicine | Admitting: Emergency Medicine

## 2022-01-19 ENCOUNTER — Other Ambulatory Visit: Payer: Self-pay

## 2022-01-19 DIAGNOSIS — R0602 Shortness of breath: Secondary | ICD-10-CM | POA: Insufficient documentation

## 2022-01-19 DIAGNOSIS — R0789 Other chest pain: Secondary | ICD-10-CM | POA: Insufficient documentation

## 2022-01-19 DIAGNOSIS — R079 Chest pain, unspecified: Secondary | ICD-10-CM | POA: Diagnosis not present

## 2022-01-19 LAB — BASIC METABOLIC PANEL
Anion gap: 11 (ref 5–15)
BUN: 9 mg/dL (ref 8–23)
CO2: 23 mmol/L (ref 22–32)
Calcium: 9.7 mg/dL (ref 8.9–10.3)
Chloride: 106 mmol/L (ref 98–111)
Creatinine, Ser: 0.95 mg/dL (ref 0.44–1.00)
GFR, Estimated: >60 mL/min (ref >60)
Glucose, Bld: 119 mg/dL — ABNORMAL HIGH (ref 70–99)
Potassium: 3.8 mmol/L (ref 3.5–5.1)
Sodium: 140 mmol/L (ref 135–145)

## 2022-01-19 LAB — CBC
HCT: 37.5 % (ref 36.0–46.0)
Hemoglobin: 12.8 g/dL (ref 12.0–15.0)
MCH: 30.6 pg (ref 26.0–34.0)
MCHC: 34.1 g/dL (ref 30.0–36.0)
MCV: 89.7 fL (ref 80.0–100.0)
Platelets: 265 10*3/uL (ref 150–400)
RBC: 4.18 MIL/uL (ref 3.87–5.11)
RDW: 12.6 % (ref 11.5–15.5)
WBC: 8.2 10*3/uL (ref 4.0–10.5)
nRBC: 0 % (ref 0.0–0.2)

## 2022-01-19 LAB — TROPONIN I (HIGH SENSITIVITY): Troponin I (High Sensitivity): 10 ng/L (ref <18)

## 2022-01-19 LAB — BRAIN NATRIURETIC PEPTIDE: B Natriuretic Peptide: 122.7 pg/mL — ABNORMAL HIGH (ref 0.0–100.0)

## 2022-01-19 NOTE — ED Triage Notes (Signed)
Pt c/o chest heaviness, shortness of breath and hypertension. Hx HTN, reports chest heaviness has been intermittent x 2 days.

## 2022-01-19 NOTE — Progress Notes (Signed)
Chronic Care Management Pharmacy Assistant   Name: [name]  MRN: [id number] DOB: [birth year]   Reason for Encounter: Hypertension Adherence Call    Recent office visits:  None  Recent consult visits:  01/14/2022 OV (Neurology) Rondel Jumbo, PA-C;  Topamax 25 mg qhs per patient's request wants to decrease the dose   Hospital visits:  None since last CPP visit  Medications: Outpatient Encounter Medications as of 01/19/2022  Medication Sig   acetaminophen (TYLENOL) 500 MG tablet Take 500 mg by mouth every 6 (six) hours as needed for moderate pain or headache.   ALPRAZolam (XANAX) 0.5 MG tablet TAKE 1 TABLET(0.5 MG) BY MOUTH TWICE DAILY AS NEEDED FOR ANXIETY (Patient not taking: Reported on 12/10/2021)   amLODipine (NORVASC) 10 MG tablet TAKE 1 TABLET(10 MG) BY MOUTH AT BEDTIME   aspirin EC 81 MG tablet Take 81 mg by mouth at bedtime.    Azelastine-Fluticasone (DYMISTA) 137-50 MCG/ACT SUSP Place 2 sprays into the nose daily.   Calcium 500 MG tablet Take 500 mg by mouth in the morning and at bedtime.   Carboxymethylcellul-Glycerin (CLEAR EYES FOR DRY EYES) 1-0.25 % SOLN Place 1 drop into both eyes daily as needed (dry eyes).   cholecalciferol (VITAMIN D3) 25 MCG (1000 UT) tablet Take 2,000 Units by mouth daily.   cloNIDine (CATAPRES) 0.1 MG tablet Take 0.1 mg by mouth See admin instructions. Take 1/2 tablet daily as needed for blood pressure (Patient not taking: Reported on 12/10/2021)   Coenzyme Q10 (COQ10) 100 MG CAPS Take 100 mg by mouth daily.   CRANBERRY PO Take 1 each by mouth 2 (two) times a week. Chewables   Cyanocobalamin (B-12) 2500 MCG TABS Take 2,500 mcg by mouth daily.   EPINEPHrine 0.3 mg/0.3 mL IJ SOAJ injection Inject 0.3 mg into the muscle as needed for anaphylaxis.    hydroquinone 4 % cream Apply 1 application topically 2 (two) times daily as needed (dark spots).   hydrOXYzine (VISTARIL) 25 MG capsule Take 1 capsule (25 mg total) by mouth every 8 (eight)  hours as needed.   ipratropium (ATROVENT) 0.03 % nasal spray INSTILL 2 SPRAYS IN EACH NOSTRIL EVERY 12 HOURS   linaclotide (LINZESS) 290 MCG CAPS capsule Take 1 capsule (290 mcg total) by mouth daily before breakfast.   loratadine (CLARITIN) 10 MG tablet Take 10 mg by mouth daily as needed for allergies.   MAGNESIUM PO Take 330 mg by mouth daily.   metoprolol succinate (TOPROL-XL) 100 MG 24 hr tablet TAKE 1 TABLET(100 MG) BY MOUTH DAILY (Patient taking differently: Take 100 mg by mouth daily.)   Omega-3 Fatty Acids (FISH OIL) 1000 MG CAPS Take 1,000 mg by mouth 3 (three) times a week.   ondansetron (ZOFRAN) 4 MG tablet TAKE 1 TABLET(4 MG) BY MOUTH EVERY 8 HOURS AS NEEDED FOR NAUSEA OR VOMITING   pantoprazole (PROTONIX) 40 MG tablet Take '40mg'$  30 minutes before breakfast and can take additional dose before evening meal if needed.   potassium chloride SA (KLOR-CON M) 20 MEQ tablet TAKE 1 TABLET(20 MEQ) BY MOUTH DAILY.   rosuvastatin (CRESTOR) 10 MG tablet Take 1 tablet (10 mg total) by mouth daily.   sodium chloride (OCEAN) 0.65 % SOLN nasal spray Place 1 spray into both nostrils as needed for congestion.   topiramate (TOPAMAX) 25 MG tablet Take 1 tablet (25 mg total) by mouth at bedtime.   triamcinolone (NASACORT) 55 MCG/ACT AERO nasal inhaler Place 1 spray into the nose daily as  needed (allergies).   triamcinolone cream (KENALOG) 0.1 % Apply 1 Application topically 2 (two) times daily.   triamterene-hydrochlorothiazide (MAXZIDE) 75-50 MG tablet Take 1 tablet by mouth daily.   valsartan (DIOVAN) 320 MG tablet TAKE 1 TABLET BY MOUTH EVERY DAY   vitamin E 400 UNIT capsule Take 400 Units by mouth 3 (three) times a week.   zinc gluconate 50 MG tablet Take 50 mg by mouth daily.   No facility-administered encounter medications on file as of 01/19/2022.   Reviewed chart prior to disease state call. Spoke with patient regarding BP  Recent Office Vitals: BP Readings from Last 3 Encounters:  01/14/22  105/75  12/10/21 (!) 160/66  10/29/21 (!) 164/70   Pulse Readings from Last 3 Encounters:  01/14/22 62  12/10/21 64  10/29/21 68    Wt Readings from Last 3 Encounters:  01/14/22 169 lb 3.2 oz (76.7 kg)  12/10/21 169 lb (76.7 kg)  10/29/21 168 lb (76.2 kg)     Kidney Function Lab Results  Component Value Date/Time   CREATININE 0.88 10/02/2021 05:35 PM   CREATININE 0.90 09/09/2021 03:19 AM   CREATININE 1.02 05/28/2021 02:15 PM   CREATININE 0.82 05/18/2021 03:46 PM   GFRNONAA >60 10/02/2021 05:35 PM   GFRNONAA 64 06/10/2020 03:41 PM   GFRAA 74 06/10/2020 03:41 PM       Latest Ref Rng & Units 10/02/2021    5:35 PM 09/09/2021    3:19 AM 07/08/2021    3:39 PM  BMP  Glucose 70 - 99 mg/dL 105  117  92   BUN 8 - 23 mg/dL '11  10  10   '$ Creatinine 0.44 - 1.00 mg/dL 0.88  0.90  0.77   Sodium 135 - 145 mmol/L 135  140  135   Potassium 3.5 - 5.1 mmol/L 4.1  4.2  3.7   Chloride 98 - 111 mmol/L 100  106  100   CO2 22 - 32 mmol/L '26  26  25   '$ Calcium 8.9 - 10.3 mg/dL 9.8  9.5  9.5     Current antihypertensive regimen:  Metoprolol Succinate 100 mg daily Valsartan 320 mg daily  How often are you checking your Blood Pressure? daily  Current home BP readings: 160/60's 01/18/2022 at the dentist, 108/61 01/19/2022  What recent interventions/DTPs have been made by any provider to improve Blood Pressure control since last CPP Visit: no recent interventions or DTPs.  Any recent hospitalizations or ED visits since last visit with CPP? No  What diet changes have been made to improve Blood Pressure Control?  Patient states she doesn't have much of an appetite. She eats a lot of fruits and vegetables. She does not drink any caffeine, she only drinks decaf coffee.  What exercise is being done to improve your Blood Pressure Control?  Patient states she does not exercise at the time. She has to take her grandson to school and back so she does not have time to go to drive to the gym.  Adherence  Review: Is the patient currently on ACE/ARB medication? Yes Does the patient have >5 day gap between last estimated fill dates? No  Patient states she has had some HA's. She describes it as band like and some pressure in her head. She states her nose is also stuffy so it may be sinus pressure. She is taking Topamax with minimal relief.  Patient states she has increased her dosage of Rosuvastatin to 10 mg a day. She denies any known  issues or side effects from this medication.  Care Gaps: Medicare Annual Wellness: Completed 02/05/2021 Ophthalmology Exam: Overdue since 12/11/2020 Foot Exam: Overdue since 12/10/2018 Hemoglobin A1C: 6.1% on 11/05/2021 Colonoscopy: Completed 04/15/2021 Dexa Scan: Completed 03/06/2021 Mammogram: Completed 12/23/2020  Future Appointments  Date Time Provider [place]  02/18/2022  9:30 AM [facility] 4 - SS [facility]  02/18/2022 10:30 AM [facility] HS VASC 4 - SS [facility]  02/18/2022 11:00 AM [facility] PA [facility]  03/04/2022 11:45 AM [facility] PHARMACIST [facility]  07/19/2022  9:30 AM Wertman, Coralee Pesa, PA-C [facility] None   Star Rating Drugs: Rosuvastatin 10 mg last filled 01/06/2022 90 DS Valsartan 320 mg last filled 10/23/2021 90 DS  April D Calhoun, [place] Pharmacist Assistant [phone]

## 2022-01-19 NOTE — ED Provider Notes (Signed)
[facility] Emergency Department Provider Note MRN:  [id number]  Arrival date & time: 01/20/22     Chief Complaint   Chest Pain   History of Present Illness   [name] is a 66 y.o. year-old female presents to the ED with chief complaint of chest pain and chest heaviness with some associated shortness of breath for the past couple of days.  She states that she has actually noticed some of the symptoms developing as much as a week or 2 ago.  She denies any heart or lung problems.  She states that she has had some swelling and some redness of her leg.  She reports having some blockages in her carotid arteries and takes aspirin, but is not otherwise anticoagulated.    History provided by patient.   Review of Systems  Pertinent positive and negative review of systems noted in HPI.    Physical Exam   Vitals:   01/20/22 0045 01/20/22 0100  BP: (!) 145/64 (!) 150/69  Pulse: (!) 55 (!) 55  Resp: 13 14  Temp:    SpO2: 100% 100%    CONSTITUTIONAL:  well-appearing, NAD NEURO:  Alert and oriented x 3, CN 3-12 grossly intact EYES:  eyes equal and reactive ENT/NECK:  Supple, no stridor  CARDIO:  normal rate, regular rhythm, appears well-perfused  PULM:  No respiratory distress, CTAB GI/GU:  non-distended, nontender MSK/SPINE:  No gross deformities, no edema, moves all extremities SKIN:  no rash, atraumatic   *Additional and/or pertinent findings included in MDM below  Diagnostic and Interventional Summary    EKG Interpretation  Date/Time:    Ventricular Rate:    PR Interval:    QRS Duration:   QT Interval:    QTC Calculation:   R Axis:     Text Interpretation:         Labs Reviewed  BASIC METABOLIC PANEL - Abnormal; Notable for the following components:      Result Value   Glucose, Bld 119 (*)    All other components within normal limits  BRAIN NATRIURETIC PEPTIDE - Abnormal; Notable for the following components:   B Natriuretic  Peptide 122.7 (*)    All other components within normal limits  CBC  TROPONIN I (HIGH SENSITIVITY)  TROPONIN I (HIGH SENSITIVITY)    DG Chest 2 View  Final Result      Medications - No data to display   Procedures  /  Critical Care Procedures  ED Course and Medical Decision Making  I have reviewed the triage vital signs, the nursing notes, and pertinent available records from the EMR.  Social Determinants Affecting Complexity of Care: Patient .   ED Course:    Medical Decision Making Patient here with chest tightness and heaviness for the past couple of weeks, but worsened in the last day or two.  Labs and imaging are reassuring.  I doubt ACS, doubt PE.  She is not hypoxic nor tachycardic.  She doesn't appear to be in any distress.  Last echo was 3 years ago and had normal EF, but she does have trace edema in the lower extremities.  I think she would benefit from following up with cardiology.    Amount and/or Complexity of Data Reviewed Labs: ordered.    Details: Trop is 10 x2, doubt ACS.  No leukocytosis, doubt infection.  Normal electrolytes.  BNP mildly elevated at 122.7. Radiology: ordered and independent interpretation performed.    Details: No pneumonia ECG/medicine tests: independent  interpretation performed.    Details: No acute ischemic changes     Consultants: No consultations were needed in caring for this patient.   Treatment and Plan: I considered admission due to patient's initial presentation, but after considering the examination and diagnostic results, patient will not require admission and can be discharged with outpatient follow-up.    Final Clinical Impressions(s) / ED Diagnoses     ICD-10-CM   1. Chest tightness  R07.89 Ambulatory referral to Cardiology      ED Discharge Orders          Ordered    Ambulatory referral to Cardiology       Comments: If you have not heard from the Cardiology office within the next 72 hours please  call [phone].   01/20/22 0111              Discharge Instructions Discussed with and Provided to Patient:   Discharge Instructions   None      Montine Circle, PA-C 01/20/22 0139    Orpah Greek, MD 01/21/22 [phone]

## 2022-01-20 NOTE — Progress Notes (Unsigned)
Office Visit    Patient Name: [name] Date of Encounter: 01/22/2022  Primary Care Provider:  Susy Frizzle, MD Primary Cardiologist:  Jenkins Rouge, MD Primary Electrophysiologist: None  Chief Complaint    [name] is a 66 y.o. female with PMH of HTN, HLD, GERD, PVD, who presents for follow-up of chest pain and shortness of breath.  Past Medical History    Past Medical History:  Diagnosis Date   Abdominal aortic aneurysm (AAA) 3.0 cm to 5.0 cm in diameter in female [facility])    None seen on prior imaging?    Anxiety    Cataract    Chronic left shoulder pain    Headache    Hiatal hernia    small   Hyperlipidemia    Hypertension    Pre-diabetes    Schatzki's ring    Seasonal allergies    Tremor    Past Surgical History:  Procedure Laterality Date   CARDIAC CATHETERIZATION  2005   "[name] has essentially normal coronary arteries and normal left ventricular function.  She will be treated empirically with anti-reflux measures"   CATARACT EXTRACTION, BILATERAL     CHOLECYSTECTOMY     COLONOSCOPY    06/16/2004   ATF:TDDUKG rectum/Diminutive polyps of the splenic flexure and the cecum/The remainder of the colonic mucosa appeared normal pathology (hyperplastic polyps).   COLONOSCOPY N/A 08/07/2015   Dr. Gala Romney: 6 mm descending colon tubular adenoma, multiple medium-mouthed diverticula in sigmoid colon. surveillance 2022   COLONOSCOPY N/A 04/15/2021   Procedure: COLONOSCOPY;  Surgeon: Daneil Dolin, MD;  Location: [facility];  Service: Endoscopy;  Laterality: N/A;  9:30am   ESOPHAGOGASTRODUODENOSCOPY  09/11/2001   URK:YHCWCBJSE ring otherwise normal esophagus/Normal stomach/Normal D1 and D2 status post passage of a 56 French Maloney dilator   ESOPHAGOGASTRODUODENOSCOPY N/A 11/11/2017   Procedure: ESOPHAGOGASTRODUODENOSCOPY (EGD);  Surgeon: Daneil Dolin, MD; mild Schatzki ring s/p dilation and mild erosive gastropathy.    ESOPHAGOGASTRODUODENOSCOPY (EGD) WITH ESOPHAGEAL DILATION N/A 01/25/2013   Dr. Gala Romney- schatzki's ring- 103 F dilation. small hiatal hernia   HYSTEROSCOPY WITH D & C N/A 03/06/2019   Procedure: DILATATION AND CURETTAGE /HYSTEROSCOPY;  Surgeon: Jonnie Kind, MD;  Location: [facility];  Service: Gynecology;  Laterality: N/A;   MALONEY DILATION N/A 11/11/2017   Procedure: [name] DILATION;  Surgeon: Daneil Dolin, MD;  Location: [facility];  Service: Endoscopy;  Laterality: N/A;   POLYPECTOMY N/A 03/06/2019   Procedure: POLYPECTOMY ( REMOVAL OF ENDOMETRIAL POLYP);  Surgeon: Jonnie Kind, MD;  Location: [facility];  Service: Gynecology;  Laterality: N/A;   POLYPECTOMY  04/15/2021   Procedure: POLYPECTOMY;  Surgeon: Daneil Dolin, MD;  Location: [facility];  Service: Endoscopy;;   TUBAL LIGATION      Allergies  Allergies  Allergen Reactions   Ciprofloxacin Shortness Of Breath   Latex Other (See Comments)    Burns skin   Vitamin C [Bioflavonoid Products] Itching    History of Present Illness    [name] has a PMH of is a 66 year old female with the above mention past medical history who presents today for posthospital follow-up of chest pain.  [name] was initially seen 01/2019 for complaint of hypertension.  She had systolic blood pressures in the 190s and was also complaining of fluttering in chest.  She had a normal cardiac cath completed 2014 due to chest pain that later was revealed to be GI related.  2D echo was ordered with EF of 60% and LVH noted, and nonischemic Myoview was also completed 03/2020.  She was seen in the ED on 05/2020 due to increased BP and chronic headache with numbness in the face.  She was seen in the ED on 09/18/2021 with complaint of chest pain.  She described the pain as tightness and heaviness that has worsened over the past couple weeks.  She was noted to have trace edema in lower extremities.  BNP today slightly elevated at 122.7.  Patient was  discharged following negative work-up for ACS and PE.  Patient advised to follow-up with cardiology.  [name] presents today alone for follow-up.  Since last being seen in the office patient reports that she is not had any continued chest tightness but has noticed lower extremity swelling.  She is compliant with current medication regimen and denies any sodium indiscretions.  Blood pressure today is elevated at 160/80 and was 172/84 on recheck.  She is currently doing yoga twice a week at the [facility] for exercise.  She also states that she has intermittent claudication when walking that requires frequent breaks.  She is scheduled to follow-up with Dr. Donzetta Matters with [facility] next month.  Patient denies chest pain, palpitations, dyspnea, PND, orthopnea, nausea, vomiting, dizziness, syncope, edema, weight gain, or early satiety.  Home Medications    Current Outpatient Medications  Medication Sig Dispense Refill   acetaminophen (TYLENOL) 500 MG tablet Take 500 mg by mouth every 6 (six) hours as needed for moderate pain or headache.     ALPRAZolam (XANAX) 0.5 MG tablet TAKE 1 TABLET(0.5 MG) BY MOUTH TWICE DAILY AS NEEDED FOR ANXIETY 60 tablet 2   amLODipine (NORVASC) 10 MG tablet TAKE 1 TABLET(10 MG) BY MOUTH AT BEDTIME 90 tablet 3   aspirin EC 81 MG tablet Take 81 mg by mouth at bedtime.      Azelastine-Fluticasone (DYMISTA) 137-50 MCG/ACT SUSP Place 2 sprays into the nose daily. 23 g 11   Calcium 500 MG tablet Take 500 mg by mouth in the morning and at bedtime.     Carboxymethylcellul-Glycerin (CLEAR EYES FOR DRY EYES) 1-0.25 % SOLN Place 1 drop into both eyes daily as needed (dry eyes).     cholecalciferol (VITAMIN D3) 25 MCG (1000 UT) tablet Take 2,000 Units by mouth daily.     cloNIDine (CATAPRES) 0.1 MG tablet Take 0.1 mg by mouth See admin instructions. Take 1/2 tablet daily as needed for blood pressure     Coenzyme Q10 (COQ10) 100 MG CAPS Take 100 mg by mouth daily.     CRANBERRY PO Take 1 each by  mouth 2 (two) times a week. Chewables     Cyanocobalamin (B-12) 2500 MCG TABS Take 2,500 mcg by mouth daily.     EPINEPHrine 0.3 mg/0.3 mL IJ SOAJ injection Inject 0.3 mg into the muscle as needed for anaphylaxis.      furosemide (LASIX) 20 MG tablet Take 1 tablet (20 mg total) by mouth daily as needed (Weight Gain 3lbs in 24 hrs 5lbs in 1 week). 30 tablet 1   hydroquinone 4 % cream Apply 1 application topically 2 (two) times daily as needed (dark spots).     hydrOXYzine (VISTARIL) 25 MG capsule Take 1 capsule (25 mg total) by mouth every 8 (eight) hours as needed. 30 capsule 0   ipratropium (ATROVENT) 0.03 % nasal spray INSTILL 2 SPRAYS IN EACH NOSTRIL EVERY 12 HOURS 30 mL 12   linaclotide (LINZESS) 290 MCG CAPS  capsule Take 1 capsule (290 mcg total) by mouth daily before breakfast. 90 capsule 3   loratadine (CLARITIN) 10 MG tablet Take 10 mg by mouth daily as needed for allergies.     MAGNESIUM PO Take 330 mg by mouth daily.     metoprolol succinate (TOPROL-XL) 100 MG 24 hr tablet TAKE 1 TABLET(100 MG) BY MOUTH DAILY (Patient taking differently: Take 100 mg by mouth daily.) 90 tablet 3   Omega-3 Fatty Acids (FISH OIL) 1000 MG CAPS Take 1,000 mg by mouth 3 (three) times a week.     ondansetron (ZOFRAN) 4 MG tablet TAKE 1 TABLET(4 MG) BY MOUTH EVERY 8 HOURS AS NEEDED FOR NAUSEA OR VOMITING 30 tablet 0   pantoprazole (PROTONIX) 40 MG tablet Take '40mg'$  30 minutes before breakfast and can take additional dose before evening meal if needed. 60 tablet 5   potassium chloride SA (KLOR-CON M) 20 MEQ tablet TAKE 1 TABLET(20 MEQ) BY MOUTH DAILY. 30 tablet 5   rosuvastatin (CRESTOR) 10 MG tablet Take 1 tablet (10 mg total) by mouth daily. 90 tablet 3   sodium chloride (OCEAN) 0.65 % SOLN nasal spray Place 1 spray into both nostrils as needed for congestion.     topiramate (TOPAMAX) 25 MG tablet Take 1 tablet (25 mg total) by mouth at bedtime. 30 tablet 11   triamcinolone (NASACORT) 55 MCG/ACT AERO nasal  inhaler Place 1 spray into the nose daily as needed (allergies).     triamcinolone cream (KENALOG) 0.1 % Apply 1 Application topically 2 (two) times daily. 30 g 0   triamterene-hydrochlorothiazide (MAXZIDE) 75-50 MG tablet Take 1 tablet by mouth daily. 90 tablet 3   valsartan (DIOVAN) 320 MG tablet TAKE 1 TABLET BY MOUTH EVERY DAY 90 tablet 0   vitamin E 400 UNIT capsule Take 400 Units by mouth 3 (three) times a week.     zinc gluconate 50 MG tablet Take 50 mg by mouth daily.     No current facility-administered medications for this visit.     Review of Systems  Please see the history of present illness.    (+) Lower extremity swelling (+) Occasional chest discomfort  All other systems reviewed and are otherwise negative except as noted above.  Physical Exam    Wt Readings from Last 3 Encounters:  01/22/22 168 lb 12.8 oz (76.6 kg)  01/14/22 169 lb 3.2 oz (76.7 kg)  12/10/21 169 lb (76.7 kg)   VS: Vitals:   01/22/22 0947 01/22/22 1240  BP: (!) 160/80 (!) 172/84  Pulse: 65   SpO2: 100%   ,Body mass index is 27.25 kg/m.  Constitutional:      Appearance: Healthy appearance. Not in distress.  Neck:     Vascular: JVD normal.  Pulmonary:     Effort: Pulmonary effort is normal.     Breath sounds: No wheezing. No rales. Diminished in the bases Cardiovascular:     Normal rate. Regular rhythm. Normal S1. Normal S2.      Murmurs: There is no murmur.  +1 carotid bruit Edema:    Peripheral edema absent.  Abdominal:     Palpations: Abdomen is soft non tender. There is no hepatomegaly.  Skin:    General: Skin is warm and dry.  Neurological:     General: No focal deficit present.     Mental Status: Alert and oriented to person, place and time.     Cranial Nerves: Cranial nerves are intact.  EKG/LABS/Other Studies Reviewed    ECG  personally reviewed by me today -none completed   Lab Results  Component Value Date   WBC 8.2 01/19/2022   HGB 12.8 01/19/2022   HCT 37.5  01/19/2022   MCV 89.7 01/19/2022   PLT 265 01/19/2022   Lab Results  Component Value Date   CREATININE 0.95 01/19/2022   BUN 9 01/19/2022   NA 140 01/19/2022   K 3.8 01/19/2022   CL 106 01/19/2022   CO2 23 01/19/2022   Lab Results  Component Value Date   ALT 18 10/02/2021   AST 18 10/02/2021   ALKPHOS 72 10/02/2021   BILITOT 0.8 10/02/2021   Lab Results  Component Value Date   CHOL 163 01/20/2021   HDL 60 01/20/2021   LDLCALC 89 01/20/2021   TRIG 62 01/20/2021   CHOLHDL 2.7 01/20/2021    Lab Results  Component Value Date   HGBA1C 6.1 (H) 11/05/2021    Assessment & Plan    1.  Chest pain: -Last ischemic evaluation completed 08/2020 and showed normal perfusion with no ischemia low risk study -Patient reports chest discomfort and will arrange for cardiac CTA to evaluate for possible ischemia -BMET today -Continue ASA 81 mg and Toprol 100 XL mg daily -Continue amlodipine 10 mg for angina  2.  Lower extremity edema/intermittent claudication: -BNP slightly elevated in the ED at 127 -Today patient has trace to +1 edema present -She also endorses cold extremities and claudication when walking. -She is currently followed by [facility] and will follow-up next month during regular appointment. -2D echo to evaluate lower extremity swelling -We will start Lasix 20 mg as needed -Low sodium diet, fluid restriction <2L, and daily weights encouraged. Educated to contact our office for weight gain of 2 lbs overnight or 5 lbs in one week.   3.  Labile hypertension: HYPERTENSION CONTROL Vitals:   01/22/22 0947 01/22/22 1240  BP: (!) 160/80 (!) 172/84    The patient's blood pressure is elevated above target today.  In order to address the patient's elevated BP: Blood pressure will be monitored at home to determine if medication changes need to be made.; A new medication was prescribed today.; A referral to the [facility] will be placed.      -Patient's blood  pressure today was elevated at 160/80 -Continue Toprol XL, Maxide 75-50 mg, clonidine 0.1 mg and amlodipine 10 mg  4.  Hyperlipidemia: -LDL cholesterol was 89 on 01/2021 -Continue Crestor 10 mg  5.  Carotid artery stenosis: -Patient has right carotid bruit present that is currently being followed by [facility]  Disposition: Follow-up with Jenkins Rouge, MD or [facility] in 1 months    Medication Adjustments/Labs and Tests Ordered: Current medicines are reviewed at length with the patient today.  Concerns regarding medicines are outlined above.   Signed, Mable Fill, Marissa Nestle, NP 01/22/2022, 12:45 PM [place]

## 2022-01-20 NOTE — ED Notes (Signed)
Pt ambulated to the bathroom. Pt's gait steady and is able to walk independently.

## 2022-01-22 ENCOUNTER — Other Ambulatory Visit (HOSPITAL_COMMUNITY): Payer: Self-pay | Admitting: Family Medicine

## 2022-01-22 ENCOUNTER — Encounter: Payer: Self-pay | Admitting: Nurse Practitioner

## 2022-01-22 ENCOUNTER — Ambulatory Visit: Payer: Medicare Other | Attending: Nurse Practitioner | Admitting: Nurse Practitioner

## 2022-01-22 VITALS — BP 172/84 | HR 65 | Ht 66.0 in | Wt 168.8 lb

## 2022-01-22 DIAGNOSIS — E785 Hyperlipidemia, unspecified: Secondary | ICD-10-CM | POA: Diagnosis not present

## 2022-01-22 DIAGNOSIS — R0989 Other specified symptoms and signs involving the circulatory and respiratory systems: Secondary | ICD-10-CM

## 2022-01-22 DIAGNOSIS — I6523 Occlusion and stenosis of bilateral carotid arteries: Secondary | ICD-10-CM

## 2022-01-22 DIAGNOSIS — R0789 Other chest pain: Secondary | ICD-10-CM | POA: Diagnosis not present

## 2022-01-22 DIAGNOSIS — R079 Chest pain, unspecified: Secondary | ICD-10-CM | POA: Diagnosis not present

## 2022-01-22 DIAGNOSIS — Z1231 Encounter for screening mammogram for malignant neoplasm of breast: Secondary | ICD-10-CM

## 2022-01-22 DIAGNOSIS — R0602 Shortness of breath: Secondary | ICD-10-CM

## 2022-01-22 MED ORDER — FUROSEMIDE 20 MG PO TABS: 20.0000 mg | ORAL_TABLET | Freq: Every day | ORAL | 1 refills | Status: DC | PRN

## 2022-01-22 NOTE — Patient Instructions (Addendum)
Medication Instructions:   START TAKING: FUROSEMIDE 20 MG AS NEEDED FOR WEIGHT GAIN 3 LBS IN 24 HOURS 5 LBS IN A WEEK   SPECIAL INSTRUCTIONS: ONCE PICK UP MEDICATION TAKE  20 MG TODAY AND TAKE  20 MG  TOMORROW      *If you need a refill on your cardiac medications before your next appointment, please call your pharmacy*   Lab Work: RETURN FOR BMET  A WEEK BEFORE CTA    If you have labs (blood work) drawn today and your tests are completely normal, you will receive your results only by: [place] (if you have MyChart) OR A paper copy in the mail If you have any lab test that is abnormal or we need to change your treatment, we will call you to review the results.   Testing/Procedures: Non-Cardiac CT Angiography (CTA), is a special type of CT scan that uses a computer to produce multi-dimensional views of major blood vessels throughout the body. In CT angiography, a contrast material is injected through an IV to help visualize the blood vessels   Your physician has requested that you have an echocardiogram. Echocardiography is a painless test that uses sound waves to create images of your heart. It provides your doctor with information about the size and shape of your heart and how well your heart's chambers and valves are working. This procedure takes approximately one hour. There are no restrictions for this procedure.    Follow-Up: At [facility], you and your health needs are our priority.  As part of our continuing mission to provide you with exceptional heart care, we have created designated Provider Care Teams.  These Care Teams include your primary Cardiologist (physician) and Advanced Practice Providers (APPs -  Physician Assistants and Nurse Practitioners) who all work together to provide you with the care you need, when you need it.  We recommend signing up for the patient portal called "MyChart".  Sign up information is provided on this After Visit Summary.   MyChart is used to connect with patients for Virtual Visits (Telemedicine).  Patients are able to view lab/test results, encounter notes, upcoming appointments, etc.  Non-urgent messages can be sent to your provider as well.   To learn more about what you can do with MyChart, go to [email or web address].    Your next appointment:    8 week(s)  The format for your next appointment:   In Person   Provider:  Ambrose Pancoast, NP    Other Instructions  MAKE SURE: YOU TAKE BLOOD PRESSURES EVERYDAY FOR A WEEK AND CALL OFFICE OR SEND [place]      Your cardiac CT will be scheduled at one of the below locations:   [facility] [address] [place], [place] [address] [phone]  [place], [place] [address] [phone]  [facility] [place], [place] [address] [phone]  If scheduled at [facility], please arrive at the [facility] Entrance (Entrance C2) of [facility] 30 minutes prior to test start time. You can use the FREE valet parking offered at entrance C (encouraged to control the heart rate for the test)  Proceed to the [facility] Radiology Department (first floor) to check-in and test prep.  All radiology patients and guests should use entrance C2 at [facility], accessed from [facility], even though  the hospital's physical address listed is [address].    If scheduled at [facility] or [facility], please arrive 15 mins early for check-in and test prep.   Please follow these instructions carefully (unless otherwise directed):    On the Night Before the Test: Be sure to Drink plenty of water. Do not consume any caffeinated/decaffeinated beverages or chocolate 12 hours prior to your test. Do not take  any antihistamines 12 hours prior to your test.  On the Day of the Test: Drink plenty of water until 1 hour prior to the test. You may take your regular medications prior to the test.  Take metoprolol (Lopressor) two hours prior to test. HOLD Furosemide/Hydrochlorothiazide morning of the test. FEMALES- please wear underwire-free bra if available, avoid dresses & tight clothing   After the Test: Drink plenty of water. After receiving IV contrast, you may experience a mild flushed feeling. This is normal. On occasion, you may experience a mild rash up to 24 hours after the test. This is not dangerous. If this occurs, you can take Benadryl 25 mg and increase your fluid intake. If you experience trouble breathing, this can be serious. If it is severe call 911 IMMEDIATELY. If it is mild, please call our office. If you take any of these medications: Glipizide/Metformin, Avandament, Glucavance, please do not take 48 hours after completing test unless otherwise instructed.  We will call to schedule your test 2-4 weeks out understanding that some insurance companies will need an authorization prior to the service being performed.   For non-scheduling related questions, please contact the cardiac imaging nurse navigator should you have any questions/concerns: Marchia Bond, Cardiac Imaging Nurse Navigator Gordy Clement, Cardiac Imaging Nurse Navigator [facility] Direct Office Dial: [phone]   For scheduling needs, including cancellations and rescheduling, please call [place], [phone].  Important Information About Sugar

## 2022-01-25 ENCOUNTER — Ambulatory Visit (HOSPITAL_COMMUNITY)
Admission: RE | Admit: 2022-01-25 | Discharge: 2022-01-25 | Disposition: A | Discharge status: Home / Self Care | Payer: Medicare Other | Source: Ambulatory Visit | Attending: Family Medicine | Admitting: Family Medicine

## 2022-01-25 ENCOUNTER — Encounter: Payer: Self-pay | Admitting: Family Medicine

## 2022-01-25 DIAGNOSIS — Z1231 Encounter for screening mammogram for malignant neoplasm of breast: Secondary | ICD-10-CM | POA: Diagnosis not present

## 2022-01-28 ENCOUNTER — Telehealth: Payer: Self-pay

## 2022-01-28 NOTE — Telephone Encounter (Signed)
Pt states she is currently having cramping in her hands. Pt states her cardiologist increased her Lasix dose. Pt asks if her potassium 20 meq can be increased to help with the cramping? Thank you.

## 2022-01-29 ENCOUNTER — Other Ambulatory Visit: Payer: Self-pay

## 2022-01-29 DIAGNOSIS — R252 Cramp and spasm: Secondary | ICD-10-CM

## 2022-01-29 DIAGNOSIS — I10 Essential (primary) hypertension: Secondary | ICD-10-CM

## 2022-01-29 NOTE — Progress Notes (Unsigned)
po

## 2022-01-31 ENCOUNTER — Other Ambulatory Visit: Payer: Self-pay | Admitting: Family Medicine

## 2022-01-31 DIAGNOSIS — I10 Essential (primary) hypertension: Secondary | ICD-10-CM

## 2022-02-01 ENCOUNTER — Other Ambulatory Visit: Payer: Medicare Other

## 2022-02-01 DIAGNOSIS — I10 Essential (primary) hypertension: Secondary | ICD-10-CM | POA: Diagnosis not present

## 2022-02-01 DIAGNOSIS — E785 Hyperlipidemia, unspecified: Secondary | ICD-10-CM

## 2022-02-01 DIAGNOSIS — R252 Cramp and spasm: Secondary | ICD-10-CM

## 2022-02-01 NOTE — Telephone Encounter (Signed)
Refilled 05/12/2021 #90 3 refills - 1 year supply. Requested Prescriptions  Pending Prescriptions Disp Refills  . metoprolol succinate (TOPROL-XL) 100 MG 24 hr tablet [Pharmacy Med Name: METOPROLOL ER SUCCINATE '100MG'$  TABS] 90 tablet 3    Sig: TAKE 1 TABLET(100 MG) BY MOUTH DAILY     Cardiovascular:  Beta Blockers Failed - 01/31/2022  8:05 AM      Failed - Last BP in normal range    BP Readings from Last 1 Encounters:  01/22/22 (!) 172/84         Passed - Last Heart Rate in normal range    Pulse Readings from Last 1 Encounters:  01/22/22 65         Passed - Valid encounter within last 6 months    Recent Outpatient Visits          4 months ago Unilateral headache   [place] Dennard Schaumann, Cammie Mcgee, MD   4 months ago Lumbar radiculopathy, chronic   [place] Dennard Schaumann, Cammie Mcgee, MD   7 months ago Viral upper respiratory tract infection   [place] Susy Frizzle, MD   8 months ago Abnormal urine color   [place] Dennard Schaumann, Cammie Mcgee, MD   10 months ago Chest wall pain   [place] Pickard, Cammie Mcgee, MD      Future Appointments            In 1 month Barbarann Ehlers, Junius Creamer., NP [facility] [place]. [facility], [facility]

## 2022-02-02 ENCOUNTER — Encounter (HOSPITAL_COMMUNITY): Payer: Self-pay | Admitting: *Deleted

## 2022-02-02 ENCOUNTER — Emergency Department (HOSPITAL_COMMUNITY)
Admission: EM | Admit: 2022-02-02 | Discharge: 2022-02-03 | Disposition: A | Discharge status: Home / Self Care | Payer: Medicare Other | Attending: Emergency Medicine | Admitting: Emergency Medicine

## 2022-02-02 ENCOUNTER — Other Ambulatory Visit: Payer: Self-pay

## 2022-02-02 DIAGNOSIS — T881XXA Other complications following immunization, not elsewhere classified, initial encounter: Secondary | ICD-10-CM | POA: Insufficient documentation

## 2022-02-02 DIAGNOSIS — Z91040 Latex allergy status: Secondary | ICD-10-CM | POA: Insufficient documentation

## 2022-02-02 DIAGNOSIS — M791 Myalgia, unspecified site: Secondary | ICD-10-CM | POA: Diagnosis not present

## 2022-02-02 DIAGNOSIS — Z7982 Long term (current) use of aspirin: Secondary | ICD-10-CM | POA: Insufficient documentation

## 2022-02-02 DIAGNOSIS — R509 Fever, unspecified: Secondary | ICD-10-CM | POA: Diagnosis not present

## 2022-02-02 DIAGNOSIS — R079 Chest pain, unspecified: Secondary | ICD-10-CM | POA: Diagnosis not present

## 2022-02-02 DIAGNOSIS — R5381 Other malaise: Secondary | ICD-10-CM | POA: Insufficient documentation

## 2022-02-02 DIAGNOSIS — R110 Nausea: Secondary | ICD-10-CM | POA: Insufficient documentation

## 2022-02-02 DIAGNOSIS — Z20822 Contact with and (suspected) exposure to covid-19: Secondary | ICD-10-CM | POA: Insufficient documentation

## 2022-02-02 DIAGNOSIS — R6889 Other general symptoms and signs: Secondary | ICD-10-CM

## 2022-02-02 LAB — COMPREHENSIVE METABOLIC PANEL
AG Ratio: 1.7 (calc) (ref 1.0–2.5)
ALT: 15 U/L (ref 6–29)
AST: 17 U/L (ref 10–35)
Albumin: 4.2 g/dL (ref 3.6–5.1)
Alkaline phosphatase (APISO): 58 U/L (ref 37–153)
BUN: 11 mg/dL (ref 7–25)
CO2: 30 mmol/L (ref 20–32)
Calcium: 9.5 mg/dL (ref 8.6–10.4)
Chloride: 103 mmol/L (ref 98–110)
Creat: 0.84 mg/dL (ref 0.50–1.05)
Globulin: 2.5 g/dL (calc) (ref 1.9–3.7)
Glucose, Bld: 107 mg/dL — ABNORMAL HIGH (ref 65–99)
Potassium: 4.2 mmol/L (ref 3.5–5.3)
Sodium: 141 mmol/L (ref 135–146)
Total Bilirubin: 0.5 mg/dL (ref 0.2–1.2)
Total Protein: 6.7 g/dL (ref 6.1–8.1)

## 2022-02-02 LAB — RESP PANEL BY RT-PCR (FLU A&B, COVID) ARPGX2
Influenza A by PCR: NEGATIVE
Influenza B by PCR: NEGATIVE
SARS Coronavirus 2 by RT PCR: NEGATIVE

## 2022-02-02 NOTE — ED Triage Notes (Signed)
Pt took RSV shot on Saturday, Monday started having nausea, body aches, denies fever, + chills.

## 2022-02-03 ENCOUNTER — Emergency Department (HOSPITAL_COMMUNITY): Payer: Medicare Other

## 2022-02-03 DIAGNOSIS — R079 Chest pain, unspecified: Secondary | ICD-10-CM | POA: Diagnosis not present

## 2022-02-03 DIAGNOSIS — R509 Fever, unspecified: Secondary | ICD-10-CM | POA: Diagnosis not present

## 2022-02-03 LAB — COMPREHENSIVE METABOLIC PANEL
ALT: 22 U/L (ref 0–44)
AST: 22 U/L (ref 15–41)
Albumin: 4.6 g/dL (ref 3.5–5.0)
Alkaline Phosphatase: 70 U/L (ref 38–126)
Anion gap: 10 (ref 5–15)
BUN: 11 mg/dL (ref 8–23)
CO2: 27 mmol/L (ref 22–32)
Calcium: 10.1 mg/dL (ref 8.9–10.3)
Chloride: 104 mmol/L (ref 98–111)
Creatinine, Ser: 0.86 mg/dL (ref 0.44–1.00)
GFR, Estimated: >60 mL/min (ref >60)
Glucose, Bld: 103 mg/dL — ABNORMAL HIGH (ref 70–99)
Potassium: 3.7 mmol/L (ref 3.5–5.1)
Sodium: 141 mmol/L (ref 135–145)
Total Bilirubin: 1 mg/dL (ref 0.3–1.2)
Total Protein: 8.4 g/dL — ABNORMAL HIGH (ref 6.5–8.1)

## 2022-02-03 LAB — CBC WITH DIFFERENTIAL/PLATELET
Abs Immature Granulocytes: 0.01 10*3/uL (ref 0.00–0.07)
Basophils Absolute: 0 10*3/uL (ref 0.0–0.1)
Basophils Relative: 0 %
Eosinophils Absolute: 0.4 10*3/uL (ref 0.0–0.5)
Eosinophils Relative: 5 %
HCT: 40.7 % (ref 36.0–46.0)
Hemoglobin: 13.4 g/dL (ref 12.0–15.0)
Immature Granulocytes: 0 %
Lymphocytes Relative: 43 %
Lymphs Abs: 3.2 10*3/uL (ref 0.7–4.0)
MCH: 29.7 pg (ref 26.0–34.0)
MCHC: 32.9 g/dL (ref 30.0–36.0)
MCV: 90.2 fL (ref 80.0–100.0)
Monocytes Absolute: 0.4 10*3/uL (ref 0.1–1.0)
Monocytes Relative: 6 %
Neutro Abs: 3.5 10*3/uL (ref 1.7–7.7)
Neutrophils Relative %: 46 %
Platelets: 278 10*3/uL (ref 150–400)
RBC: 4.51 MIL/uL (ref 3.87–5.11)
RDW: 12.6 % (ref 11.5–15.5)
WBC: 7.5 10*3/uL (ref 4.0–10.5)
nRBC: 0 % (ref 0.0–0.2)

## 2022-02-03 LAB — URINALYSIS, ROUTINE W REFLEX MICROSCOPIC
Bacteria, UA: NONE SEEN
Bilirubin Urine: NEGATIVE
Glucose, UA: NEGATIVE mg/dL
Ketones, ur: NEGATIVE mg/dL
Leukocytes,Ua: NEGATIVE
Nitrite: NEGATIVE
Protein, ur: NEGATIVE mg/dL
Specific Gravity, Urine: 1.003 — ABNORMAL LOW (ref 1.005–1.030)
pH: 7 (ref 5.0–8.0)

## 2022-02-03 NOTE — Discharge Instructions (Signed)
Your work-up today did not show any abnormalities.  Your symptoms are likely secondary to your immune system responding to the vaccination.  Drink fluids, take Tylenol as needed.  Schedule follow-up with your doctor.  Please come back if your symptoms worsen.

## 2022-02-03 NOTE — ED Provider Notes (Signed)
[facility] EMERGENCY DEPARTMENT Provider Note   CSN: [id number] Arrival date & time: 02/02/22  2145     History  Chief Complaint  Patient presents with   Generalized Body Aches    SOLAE NORLING is a 66 y.o. female.  Patient presents to the emergency department with nausea body aches, chills.  Patient reports that she hurts all over, has generalized malaise.  She has not noticed any fevers.  Patient reports that symptoms began approximately 1 day after getting RSV vaccination.       Home Medications Prior to Admission medications   Medication Sig Start Date End Date Taking? Authorizing Provider  acetaminophen (TYLENOL) 500 MG tablet Take 500 mg by mouth every 6 (six) hours as needed for moderate pain or headache.    [provider]  ALPRAZolam Duanne Moron) 0.5 MG tablet TAKE 1 TABLET(0.5 MG) BY MOUTH TWICE DAILY AS NEEDED FOR ANXIETY 08/18/21   Susy Frizzle, MD  amLODipine (NORVASC) 10 MG tablet TAKE 1 TABLET(10 MG) BY MOUTH AT BEDTIME 09/23/21   Susy Frizzle, MD  aspirin EC 81 MG tablet Take 81 mg by mouth at bedtime.     [provider]  Azelastine-Fluticasone (DYMISTA) 137-50 MCG/ACT SUSP Place 2 sprays into the nose daily. 12/10/21   Susy Frizzle, MD  Calcium 500 MG tablet Take 500 mg by mouth in the morning and at bedtime.    [provider]  Carboxymethylcellul-Glycerin (CLEAR EYES FOR DRY EYES) 1-0.25 % SOLN Place 1 drop into both eyes daily as needed (dry eyes).    [provider]  cholecalciferol (VITAMIN D3) 25 MCG (1000 UT) tablet Take 2,000 Units by mouth daily.    [provider]  cloNIDine (CATAPRES) 0.1 MG tablet Take 0.1 mg by mouth See admin instructions. Take 1/2 tablet daily as needed for blood pressure    [provider]  Coenzyme Q10 (COQ10) 100 MG CAPS Take 100 mg by mouth daily.    [provider]  CRANBERRY PO Take 1 each by mouth 2 (two) times a week. Chewables    [provider]  Cyanocobalamin (B-12) 2500 MCG TABS Take 2,500 mcg by mouth daily.    [provider]  EPINEPHrine 0.3 mg/0.3 mL IJ SOAJ injection Inject 0.3 mg into the muscle as needed for anaphylaxis.  01/05/19   [provider]  furosemide (LASIX) 20 MG tablet Take 1 tablet (20 mg total) by mouth daily as needed (Weight Gain 3lbs in 24 hrs 5lbs in 1 week). 01/22/22 04/22/22  Marylu Lund., NP  hydroquinone 4 % cream Apply 1 application topically 2 (two) times daily as needed (dark spots). 01/05/21   [provider]  hydrOXYzine (VISTARIL) 25 MG capsule Take 1 capsule (25 mg total) by mouth every 8 (eight) hours as needed. 10/29/21   Susy Frizzle, MD  ipratropium (ATROVENT) 0.03 % nasal spray INSTILL 2 SPRAYS IN EACH NOSTRIL EVERY 12 HOURS 04/17/21   Susy Frizzle, MD  linaclotide [facility]) 290 MCG CAPS capsule Take 1 capsule (290 mcg total) by mouth daily before breakfast. 03/16/21   Mahala Menghini, PA-C  loratadine (CLARITIN) 10 MG tablet Take 10 mg by mouth daily as needed for allergies.    [provider]  MAGNESIUM PO Take 330 mg by mouth daily.    [provider]  metoprolol succinate (TOPROL-XL) 100 MG 24 hr tablet TAKE 1 TABLET(100 MG) BY MOUTH DAILY Patient taking differently: Take 100 mg by  mouth daily. 05/12/21   Susy Frizzle, MD  Omega-3 Fatty Acids (FISH OIL) 1000 MG CAPS Take 1,000 mg by mouth 3 (three) times a week.    [provider]  ondansetron (ZOFRAN) 4 MG tablet TAKE 1 TABLET(4 MG) BY MOUTH EVERY 8 HOURS AS NEEDED FOR NAUSEA OR VOMITING 03/06/21   Mahala Menghini, PA-C  pantoprazole (PROTONIX) 40 MG tablet Take '40mg'$  30 minutes before breakfast and can take additional dose before evening meal if needed. 09/11/21   Mahala Menghini, PA-C  potassium chloride SA (KLOR-CON M) 20 MEQ tablet TAKE 1 TABLET(20 MEQ) BY MOUTH DAILY. 09/10/21   Susy Frizzle, MD  rosuvastatin (CRESTOR) 10 MG tablet Take 1 tablet (10 mg  total) by mouth daily. 01/06/22   Susy Frizzle, MD  sodium chloride (OCEAN) 0.65 % SOLN nasal spray Place 1 spray into both nostrils as needed for congestion.    [provider]  topiramate (TOPAMAX) 25 MG tablet Take 1 tablet (25 mg total) by mouth at bedtime. 01/14/22   Rondel Jumbo, PA-C  triamcinolone (NASACORT) 55 MCG/ACT AERO nasal inhaler Place 1 spray into the nose daily as needed (allergies).    [provider]  triamcinolone cream (KENALOG) 0.1 % Apply 1 Application topically 2 (two) times daily. 10/29/21   Susy Frizzle, MD  triamterene-hydrochlorothiazide (MAXZIDE) 75-50 MG tablet Take 1 tablet by mouth daily. 05/18/21   Susy Frizzle, MD  valsartan (DIOVAN) 320 MG tablet TAKE 1 TABLET BY MOUTH EVERY DAY 01/18/22   Susy Frizzle, MD  vitamin E 400 UNIT capsule Take 400 Units by mouth 3 (three) times a week.    [provider]  zinc gluconate 50 MG tablet Take 50 mg by mouth daily.    [provider]      Allergies    Ciprofloxacin, Latex, and Vitamin c [bioflavonoid products]    Review of Systems   Review of Systems  Physical Exam Updated Vital Signs BP (!) 161/60   Pulse (!) 51   Temp 98.2 F (36.8 C) (Oral)   Resp 16   Ht '5\' 6"'$  (1.676 m)   Wt 76.2 kg   SpO2 100%   BMI 27.12 kg/m  Physical Exam Vitals and nursing note reviewed.  Constitutional:      General: She is not in acute distress.    Appearance: She is well-developed.  HENT:     Head: Normocephalic and atraumatic.     Mouth/Throat:     Mouth: Mucous membranes are moist.  Eyes:     General: Vision grossly intact. Gaze aligned appropriately.     Extraocular Movements: Extraocular movements intact.     Conjunctiva/sclera: Conjunctivae normal.  Cardiovascular:     Rate and Rhythm: Normal rate and regular rhythm.     Pulses: Normal pulses.     Heart sounds: Normal heart sounds, S1 normal and S2 normal. No murmur heard.    No friction rub. No gallop.   Pulmonary:     Effort: Pulmonary effort is normal. No respiratory distress.     Breath sounds: Normal breath sounds.  Abdominal:     General: Bowel sounds are normal.     Palpations: Abdomen is soft.     Tenderness: There is no abdominal tenderness. There is no guarding or rebound.     Hernia: No hernia is present.  Musculoskeletal:        General: No swelling.     Cervical back: Full passive range  of motion without pain, normal range of motion and neck supple. No spinous process tenderness or muscular tenderness. Normal range of motion.     Right lower leg: No edema.     Left lower leg: No edema.  Skin:    General: Skin is warm and dry.     Capillary Refill: Capillary refill takes less than 2 seconds.     Findings: No ecchymosis, erythema, rash or wound.  Neurological:     General: No focal deficit present.     Mental Status: She is alert and oriented to person, place, and time.     GCS: GCS eye subscore is 4. GCS verbal subscore is 5. GCS motor subscore is 6.     Cranial Nerves: Cranial nerves 2-12 are intact.     Sensory: Sensation is intact.     Motor: Motor function is intact.     Coordination: Coordination is intact.  Psychiatric:        Attention and Perception: Attention normal.        Mood and Affect: Mood normal.        Speech: Speech normal.        Behavior: Behavior normal.     ED Results / Procedures / Treatments   Labs (all labs ordered are listed, but only abnormal results are displayed) Labs Reviewed  COMPREHENSIVE METABOLIC PANEL - Abnormal; Notable for the following components:      Result Value   Glucose, Bld 103 (*)    Total Protein 8.4 (*)    All other components within normal limits  URINALYSIS, ROUTINE W REFLEX MICROSCOPIC - Abnormal; Notable for the following components:   Color, Urine COLORLESS (*)    Specific Gravity, Urine 1.003 (*)    Hgb urine dipstick SMALL (*)    All other components within normal limits  RESP PANEL BY RT-PCR (FLU A&B,  COVID) ARPGX2  CBC WITH DIFFERENTIAL/PLATELET    EKG None  Radiology DG Chest 2 View  Result Date: 02/03/2022 CLINICAL DATA:  Chest pain and fever EXAM: CHEST - 2 VIEW COMPARISON:  Radiographs 01/19/2022 FINDINGS: No focal consolidation, pleural effusion, or pneumothorax. Normal cardiomediastinal silhouette. No acute osseous abnormality. Aortic atherosclerotic calcification. IMPRESSION: No active cardiopulmonary disease. Electronically Signed   By: Placido Sou M.D.   On: 02/03/2022 00:26    Procedures Procedures    Medications Ordered in ED Medications - No data to display  ED Course/ Medical Decision Making/ A&P                           Medical Decision Making Amount and/or Complexity of Data Reviewed Labs: ordered. Radiology: ordered.   Patient presents with malaise after vaccination.  Symptoms are mild and nonspecific.  Her examination is normal.  Patient underwent work-up which is entirely normal, likely secondary to the vaccination shot.  Recommend Tylenol and/or Motrin as needed, follow-up with primary care.        Final Clinical Impression(s) / ED Diagnoses Final diagnoses:  Flu-like symptoms  Vaccination complication, initial encounter    Rx / DC Orders ED Discharge Orders     None         Orpah Greek, MD 02/03/22 0139

## 2022-02-03 NOTE — ED Notes (Signed)
Patient transported to X-ray 

## 2022-02-04 ENCOUNTER — Ambulatory Visit (HOSPITAL_COMMUNITY): Payer: Medicare Other | Attending: Cardiology

## 2022-02-04 DIAGNOSIS — R0602 Shortness of breath: Secondary | ICD-10-CM | POA: Diagnosis not present

## 2022-02-04 LAB — ECHOCARDIOGRAM COMPLETE
Area-P 1/2: 3.6 cm2
S' Lateral: 2.4 cm

## 2022-02-08 ENCOUNTER — Other Ambulatory Visit: Payer: Self-pay | Admitting: *Deleted

## 2022-02-08 NOTE — Patient Outreach (Signed)
  Care Coordination   02/08/2022  Name: KEILANA MORLOCK MRN: [id number] DOB: 13-Jun-1955   Care Coordination Outreach Attempts:  A second unsuccessful outreach was attempted today to offer the patient with information about available care coordination services as a benefit of their health plan.   HIPAA compliant message left on voicemail, providing contact information for CSW, encouraging patient to return CSW's call at her earliest convenience.  Follow Up Plan:  Additional outreach attempts will be made to offer the patient care coordination information and services.   Encounter Outcome:  No Answer.   Care Coordination Interventions Activated:  No.    Care Coordination Interventions:  No, not indicated.    Nat Christen, BSW, MSW, LCSW  Licensed Education officer, environmental [facility]  Mailing [place] [address] [place], [place] [address] Physical Address-[address] [place], [place] [address] Toll Free Main # [phone] Fax # [phone] Cell # [phone] [email or web address]

## 2022-02-12 ENCOUNTER — Other Ambulatory Visit: Payer: Self-pay | Admitting: *Deleted

## 2022-02-12 DIAGNOSIS — I739 Peripheral vascular disease, unspecified: Secondary | ICD-10-CM

## 2022-02-12 DIAGNOSIS — I6523 Occlusion and stenosis of bilateral carotid arteries: Secondary | ICD-10-CM

## 2022-02-15 ENCOUNTER — Other Ambulatory Visit: Payer: Self-pay | Admitting: *Deleted

## 2022-02-15 NOTE — Patient Outreach (Signed)
  Care Coordination   02/15/2022  Name: [name] MRN: [id number] DOB: 1956/04/30   Care Coordination Outreach Attempts:  A third unsuccessful outreach was attempted today to offer the patient with information about available care coordination services as a benefit of their health plan. HIPAA compliant message left on voicemail, providing contact information for CSW, encouraging patient to return CSW's call at her earliest convenience.  Follow Up Plan:  No further outreach attempts will be made at this time. We have been unable to contact the patient to offer or enroll patient in care coordination services.  Encounter Outcome:  No Answer.   Care Coordination Interventions Activated:  No.    Care Coordination Interventions:  No, not indicated.    Nat Christen, BSW, MSW, LCSW  Licensed Education officer, environmental [facility]  Mailing [place] [address], [place], [place] [address] Physical Address-[address] [place], [place] [address] Toll Free Main # [phone] Fax # [phone] Cell # [phone] [email or web address]

## 2022-02-16 NOTE — Progress Notes (Signed)
HISTORY AND PHYSICAL     CC:  follow up. Requesting Provider:  Susy Frizzle, MD  HPI: This is a 66 y.o. female who is here today for follow up and has hx of carotid artery stenosis and PAD.  She was thought to have a small AAA, however, CT scan revealed that she did not have AAA.   She also has hx of varicose veins.    Pt was last seen on 02/18/2021.  At that time, she was not having any new back or abdominal pain.  She was walking without limitation.  She had palpable DP pulses bilaterally.   She did not have any stroke sx.    The pt returns today for follow up.    Pt denies any amaurosis fugax, speech difficulties, weakness, numbness, paralysis or clumsiness or facial droop.  She states that she does have floaters in both eyes and she does see her eye doctor for this.   Pt does have claudication with walking distance from our office to road and starting back and improves with rest.   She denies rest pain, or non healing wounds.  She has some pain around her right ankle at night.    The pt is on a statin for cholesterol management.    The pt is on an aspirin.    Other AC:  none The pt is on ARB, diuretic, clonidine, CCB, BB for hypertension.  The pt does not have diabetes. Tobacco hx:  former  Pt does not have family hx of AAA.    Past Medical History:  Diagnosis Date   Abdominal aortic aneurysm (AAA) 3.0 cm to 5.0 cm in diameter in female [facility])    None seen on prior imaging?    Anxiety    Cataract    Chronic left shoulder pain    Headache    Hiatal hernia    small   Hyperlipidemia    Hypertension    Pre-diabetes    Schatzki's ring    Seasonal allergies    Tremor     Past Surgical History:  Procedure Laterality Date   CARDIAC CATHETERIZATION  2005   "Ms. Rentz has essentially normal coronary arteries and normal left ventricular function.  She will be treated empirically with anti-reflux measures"   CATARACT EXTRACTION, BILATERAL     CHOLECYSTECTOMY      COLONOSCOPY    06/16/2004   ZOX:WRUEAV rectum/Diminutive polyps of the splenic flexure and the cecum/The remainder of the colonic mucosa appeared normal pathology (hyperplastic polyps).   COLONOSCOPY N/A 08/07/2015   Dr. Gala Romney: 6 mm descending colon tubular adenoma, multiple medium-mouthed diverticula in sigmoid colon. surveillance 2022   COLONOSCOPY N/A 04/15/2021   Procedure: COLONOSCOPY;  Surgeon: Daneil Dolin, MD;  Location: [facility];  Service: Endoscopy;  Laterality: N/A;  9:30am   ESOPHAGOGASTRODUODENOSCOPY  09/11/2001   WUJ:[id number] ring otherwise normal esophagus/Normal stomach/Normal D1 and D2 status post passage of a 56 French Maloney dilator   ESOPHAGOGASTRODUODENOSCOPY N/A 11/11/2017   Procedure: ESOPHAGOGASTRODUODENOSCOPY (EGD);  Surgeon: Daneil Dolin, MD; mild Schatzki ring s/p dilation and mild erosive gastropathy.   ESOPHAGOGASTRODUODENOSCOPY (EGD) WITH ESOPHAGEAL DILATION N/A 01/25/2013   Dr. Gala Romney- schatzki's ring- 49 F dilation. small hiatal hernia   HYSTEROSCOPY WITH D & C N/A 03/06/2019   Procedure: DILATATION AND CURETTAGE /HYSTEROSCOPY;  Surgeon: Jonnie Kind, MD;  Location: [facility];  Service: Gynecology;  Laterality: N/A;   MALONEY DILATION N/A 11/11/2017   Procedure: [name] DILATION;  Surgeon: Gala Romney,  Cristopher Estimable, MD;  Location: [facility];  Service: Endoscopy;  Laterality: N/A;   POLYPECTOMY N/A 03/06/2019   Procedure: POLYPECTOMY ( REMOVAL OF ENDOMETRIAL POLYP);  Surgeon: Jonnie Kind, MD;  Location: [facility];  Service: Gynecology;  Laterality: N/A;   POLYPECTOMY  04/15/2021   Procedure: POLYPECTOMY;  Surgeon: Daneil Dolin, MD;  Location: [facility];  Service: Endoscopy;;   TUBAL LIGATION      Allergies  Allergen Reactions   Ciprofloxacin Shortness Of Breath   Latex Other (See Comments)    Burns skin   Vitamin C [Bioflavonoid Products] Itching    Current Outpatient Medications  Medication Sig Dispense Refill   acetaminophen (TYLENOL) 500 MG  tablet Take 500 mg by mouth every 6 (six) hours as needed for moderate pain or headache.     ALPRAZolam (XANAX) 0.5 MG tablet TAKE 1 TABLET(0.5 MG) BY MOUTH TWICE DAILY AS NEEDED FOR ANXIETY 60 tablet 2   amLODipine (NORVASC) 10 MG tablet TAKE 1 TABLET(10 MG) BY MOUTH AT BEDTIME 90 tablet 3   aspirin EC 81 MG tablet Take 81 mg by mouth at bedtime.      Azelastine-Fluticasone (DYMISTA) 137-50 MCG/ACT SUSP Place 2 sprays into the nose daily. 23 g 11   Calcium 500 MG tablet Take 500 mg by mouth in the morning and at bedtime.     Carboxymethylcellul-Glycerin (CLEAR EYES FOR DRY EYES) 1-0.25 % SOLN Place 1 drop into both eyes daily as needed (dry eyes).     cholecalciferol (VITAMIN D3) 25 MCG (1000 UT) tablet Take 2,000 Units by mouth daily.     cloNIDine (CATAPRES) 0.1 MG tablet Take 0.1 mg by mouth See admin instructions. Take 1/2 tablet daily as needed for blood pressure     Coenzyme Q10 (COQ10) 100 MG CAPS Take 100 mg by mouth daily.     CRANBERRY PO Take 1 each by mouth 2 (two) times a week. Chewables     Cyanocobalamin (B-12) 2500 MCG TABS Take 2,500 mcg by mouth daily.     EPINEPHrine 0.3 mg/0.3 mL IJ SOAJ injection Inject 0.3 mg into the muscle as needed for anaphylaxis.      furosemide (LASIX) 20 MG tablet Take 1 tablet (20 mg total) by mouth daily as needed (Weight Gain 3lbs in 24 hrs 5lbs in 1 week). 30 tablet 1   hydroquinone 4 % cream Apply 1 application topically 2 (two) times daily as needed (dark spots).     hydrOXYzine (VISTARIL) 25 MG capsule Take 1 capsule (25 mg total) by mouth every 8 (eight) hours as needed. 30 capsule 0   ipratropium (ATROVENT) 0.03 % nasal spray INSTILL 2 SPRAYS IN EACH NOSTRIL EVERY 12 HOURS 30 mL 12   linaclotide (LINZESS) 290 MCG CAPS capsule Take 1 capsule (290 mcg total) by mouth daily before breakfast. 90 capsule 3   loratadine (CLARITIN) 10 MG tablet Take 10 mg by mouth daily as needed for allergies.     MAGNESIUM PO Take 330 mg by mouth daily.      metoprolol succinate (TOPROL-XL) 100 MG 24 hr tablet TAKE 1 TABLET(100 MG) BY MOUTH DAILY (Patient taking differently: Take 100 mg by mouth daily.) 90 tablet 3   Omega-3 Fatty Acids (FISH OIL) 1000 MG CAPS Take 1,000 mg by mouth 3 (three) times a week.     ondansetron (ZOFRAN) 4 MG tablet TAKE 1 TABLET(4 MG) BY MOUTH EVERY 8 HOURS AS NEEDED FOR NAUSEA OR VOMITING 30 tablet 0   pantoprazole (  PROTONIX) 40 MG tablet Take '40mg'$  30 minutes before breakfast and can take additional dose before evening meal if needed. 60 tablet 5   potassium chloride SA (KLOR-CON M) 20 MEQ tablet TAKE 1 TABLET(20 MEQ) BY MOUTH DAILY. 30 tablet 5   rosuvastatin (CRESTOR) 10 MG tablet Take 1 tablet (10 mg total) by mouth daily. 90 tablet 3   sodium chloride (OCEAN) 0.65 % SOLN nasal spray Place 1 spray into both nostrils as needed for congestion.     topiramate (TOPAMAX) 25 MG tablet Take 1 tablet (25 mg total) by mouth at bedtime. 30 tablet 11   triamcinolone (NASACORT) 55 MCG/ACT AERO nasal inhaler Place 1 spray into the nose daily as needed (allergies).     triamcinolone cream (KENALOG) 0.1 % Apply 1 Application topically 2 (two) times daily. 30 g 0   triamterene-hydrochlorothiazide (MAXZIDE) 75-50 MG tablet Take 1 tablet by mouth daily. 90 tablet 3   valsartan (DIOVAN) 320 MG tablet TAKE 1 TABLET BY MOUTH EVERY DAY 90 tablet 0   vitamin E 400 UNIT capsule Take 400 Units by mouth 3 (three) times a week.     zinc gluconate 50 MG tablet Take 50 mg by mouth daily.     No current facility-administered medications for this visit.    Family History  Problem Relation Age of Onset   Diabetes Other    Diabetes Mother    Heart disease Mother    Heart disease Father    Diabetes Sister    Diabetes Brother    Hypertension Brother    Diabetes Daughter    Hypertension Maternal Grandmother    Stroke Maternal Grandfather    Early death Paternal Grandfather    Colon cancer Neg Hx    Liver disease Neg Hx     Social History    Socioeconomic History   Marital status: Single    Spouse name: Not on file   Number of children: 3   Years of education: Not on file   Highest education level: Not on file  Occupational History   Occupation: English as a second language teacher: [employer]  Tobacco Use   Smoking status: Former    Packs/day: 0.25    Years: 35.00    Total pack years: 8.75    Types: Cigarettes    Quit date: 12/31/2012    Years since quitting: 9.1   Smokeless tobacco: Never  Vaping Use   Vaping Use: Never used  Substance and Sexual Activity   Alcohol use: Not Currently   Drug use: No   Sexual activity: Not Currently    Partners: Male    Birth control/protection: Post-menopausal, Surgical    Comment: tubal  Other Topics Concern   Not on file  Social History Narrative   Eats all food groups.    Wears seatbelt.    Has 3 children.   Prior smoker.   Attends church.    Used to work in [employer].    Drives.   Enjoys going out with family.    Right handed   One story home   Social Determinants of Health   Financial Resource Strain: Low Risk  (12/31/2021)   Overall Financial Resource Strain (CARDIA)    Difficulty of Paying Living Expenses: Not very hard  Food Insecurity: Not on file  Transportation Needs: Not on file  Physical Activity: Not on file  Stress: Not on file  Social Connections: Not on file  Intimate Partner Violence: Not on file     REVIEW  OF SYSTEMS:   '[X]'$  denotes positive finding, '[ ]'$  denotes negative finding Cardiac  Comments:  Chest pain or chest pressure:    Shortness of breath upon exertion:    Short of breath when lying flat:    Irregular heart rhythm:        Vascular    Pain in calf, thigh, or hip brought on by ambulation: x   Pain in feet at night that wakes you up from your sleep:     Blood clot in your veins:    Leg swelling:         Pulmonary    Oxygen at home:    Wheezing:         Neurologic    Sudden weakness in arms or legs:     Sudden numbness in  arms or legs:     Sudden onset of difficulty speaking or understanding others    Temporary loss of vision in one eye:     Problems with dizziness:         Gastrointestinal    Blood in stool:     Vomited blood:         Genitourinary    Burning when urinating:     Blood in urine:        Psychiatric    Major depression:         Hematologic    Bleeding problems:    Problems with blood clotting too easily:        Skin    Rashes or ulcers:        Constitutional    Fever or chills:      PHYSICAL EXAMINATION:  Today's Vitals   02/18/22 1044  BP: (!) 149/70  Pulse: (!) 54  Resp: 16  Temp: (!) 97.2 F (36.2 C)  TempSrc: Temporal  SpO2: 100%  Weight: 168 lb 4.8 oz (76.3 kg)  Height: '5\' 6"'$  (1.676 m)   Body mass index is 27.16 kg/m.   General:  WDWN in NAD; vital signs documented above Gait: Not observed HENT: WNL, normocephalic Pulmonary: normal non-labored breathing  Cardiac: regular HR;  with carotid bruits bilaterally with right > left Abdomen: soft, NT, aortic pulse is not palpable Skin: without rashes Vascular Exam/Pulses:  Right Left  Radial 2+ (normal) 2+ (normal)  Femoral 1+ (weak) 2+ (normal)  Popliteal Unable to palpate Unable to palpate  DP Brisk monophasic Brisk monophasic  PT monophasic monophasic  Peroneal monophasic absent   Extremities: there are no non healing wounds present; her skin is smooth without dryness or cracking.  Musculoskeletal: no muscle wasting or atrophy  Neurologic: A&O X 3;  speech is fluent/normal; moving all extremities equally  Psychiatric:  The pt has Normal affect.   Non-Invasive Vascular Imaging:   ABI's/TBI's on 02/18/2022: Right:  0.55/0.44- great toe pressure:  67 Left:  0.80/0.46 - great toe pressure:  70  Non-Invasive Vascular Imaging:   Carotid Duplex on 02/18/2022: Right:  60-79% ICA stenosis Left:  40-59% ICA stenosis  Previous ABI's/TBI's on 02/18/221: Right:  0.63/0.25 - great toe pressure:  44 Left:   0.76/0.40 - great toe pressure:  70  Previous Carotid duplex on 02/10/2021: Right: greater than 70% ICA stenosis Left:   50-69% ICA stenosis Antegrade flow in the bilateral vertebral arteries.    ASSESSMENT/PLAN:: 66 y.o. female here for follow up for PAD and carotid stenosis   PAD -pt right TBI and toe pressure on the right are somewhat improved from last visit and  the left is stable.   -pt does not have rest pain or non healing wounds but does endorse claudication in the right calf -encouraged her to continue graduated walking program. -pt will f/u in one year with ABI/TBI.  She knows to call sooner if her claudication becomes unbearable or if she develops any non healing wounds.  She expressed understanding.   Carotid stenosis -duplex today reveals her duplex remains stable with the right ICA having 60-79% stenosis and the left having 40-59% stenosis.  She remains asymptomatic. Discussed with her that we will continue to see her every 6 months and if her carotid stenosis gets above 80%, then we would discuss intervention.  We would consider intervention before that if she becomes symptomatic.   -discussed s/s of stroke with pt and she understands should she develop any of these sx, she will go to the nearest ER or call 911. -pt will f/u in 6 months with carotid duplex.   -continue statin/asa, which she has been compliant with.   Leontine Locket, [facility] [phone]  Clinic MD:   Scot Dock

## 2022-02-18 ENCOUNTER — Ambulatory Visit: Payer: Medicare Other | Admitting: Physician Assistant

## 2022-02-18 ENCOUNTER — Ambulatory Visit (INDEPENDENT_AMBULATORY_CARE_PROVIDER_SITE_OTHER)
Admission: RE | Admit: 2022-02-18 | Discharge: 2022-02-18 | Disposition: A | Discharge status: Home / Self Care | Payer: Medicare Other | Source: Ambulatory Visit | Attending: Vascular Surgery | Admitting: Vascular Surgery

## 2022-02-18 ENCOUNTER — Ambulatory Visit (HOSPITAL_COMMUNITY)
Admission: RE | Admit: 2022-02-18 | Discharge: 2022-02-18 | Disposition: A | Discharge status: Home / Self Care | Payer: Medicare Other | Source: Ambulatory Visit | Attending: Vascular Surgery | Admitting: Vascular Surgery

## 2022-02-18 VITALS — BP 149/70 | HR 54 | Temp 97.2°F | Resp 16 | Ht 66.0 in | Wt 168.3 lb

## 2022-02-18 DIAGNOSIS — I739 Peripheral vascular disease, unspecified: Secondary | ICD-10-CM

## 2022-02-18 DIAGNOSIS — I6523 Occlusion and stenosis of bilateral carotid arteries: Secondary | ICD-10-CM | POA: Insufficient documentation

## 2022-02-18 NOTE — Progress Notes (Signed)
Chronic Care Management Pharmacy Note  03/04/2022 Name:  [name] MRN:  [id number] DOB:  05/08/1956  Summary: PharmD FU.  Patient BP meds are unclear.  She reports BP has been controlled at home - it appears so is taking Valsartan occasionally, not taking amlodipine and taking doxazosin at night and metoprolol midday.  Reports headaches have improved.  Recommendations/Changes made from today's visit: Consider reduction of amlodipine and valsartan doses she she is not afraid to drop her BP too low.  Would prefer daily regiment vs. Guessing based on home BP.  Plan: CMA to FU in 2 weeks PharmD FU in 90 days   Subjective: [name] is an 66 y.o. year old female who is a primary patient of Pickard, Cammie Mcgee, MD.  The CCM team was consulted for assistance with disease management and care coordination needs.    Engaged with patient by telephone for follow up visit in response to provider referral for pharmacy case management and/or care coordination services.   Consent to Services:  The patient was given the following information about Chronic Care Management services today, agreed to services, and gave verbal consent: 1. CCM service includes personalized support from designated clinical staff supervised by the primary care provider, including individualized plan of care and coordination with other care providers 2. 24/7 contact phone numbers for assistance for urgent and routine care needs. 3. Service will only be billed when office clinical staff spend 20 minutes or more in a month to coordinate care. 4. Only one practitioner may furnish and bill the service in a calendar month. 5.The patient may stop CCM services at any time (effective at the end of the month) by phone call to the office staff. 6. The patient will be responsible for cost sharing (co-pay) of up to 20% of the service fee (after annual deductible is met). Patient agreed to services and consent  obtained.  Patient Care Team: Susy Frizzle, MD as PCP - General (Family Medicine) Josue Hector, MD as PCP - Cardiology (Cardiology) Gala Romney Cristopher Estimable, MD as Consulting Physician (Gastroenterology) Edythe Clarity, [facility] as Pharmacist (Pharmacist)  Recent office visits:  10/29/21 [name], MD - Family Medicine - Anxiety - hydrOXYzine (VISTARIL) 25 MG capsule, triamcinolone (NASACORT) 55 MCG/ACT AERO nasal inhaler, triamcinolone cream (KENALOG) 0.1 % prescribed. Follow up as scheduled.    09/29/21 [name], MD - Family Medicine - Unilateral headache - gabapentin (NEURONTIN) 300 MG capsule prescribed. Follow up as scheduled.    09/08/21 [name], MD - Family Medicine - Lumbar radiculopathy - MRI of Lumbar spine ordered. celecoxib (CELEBREX) 200 MG capsule prescribed. Referred for epidural injections. Follow up as scheduled.      Recent consult visits:  10/12/21 Sharene Butters, PA-C - Neurology - Essential tremor - topiramate (TOPAMAX) 50 MG tablet prescribed. Discontinue Gabapentin. Follow up in 3 months.      Hospital visits: 10/02/21 - 10/03/21 Medication Reconciliation was completed by comparing discharge summary, patient's EMR and Pharmacy list, and upon discussion with patient.   Admitted to the hospital on 10/02/21 due to TIA. Discharge date was 10/03/21. Discharged from [place]?Medications Started at [facility] Discharge:?? None noted.    Medication Changes at Hospital Discharge: None noted.   Medications Discontinued at Hospital Discharge: None noted.    Medications that remain the same after Hospital Discharge:??  All other medications will remain the same.    Hospital visits: 09/09/21 Medication Reconciliation was completed  by comparing discharge summary, patient's EMR and Pharmacy list, and upon discussion with patient.   Admitted to the hospital on 09/09/21 due to Headache. Discharge date was 09/09/21. Discharged from [place]?Medications Started at [facility] Discharge:?? meloxicam (MOBIC) 15 MG tablet   Medication Changes at Hospital Discharge: None noted.   Medications Discontinued at Hospital Discharge: None noted.    Medications that remain the same after Hospital Discharge:??  All other medications will remain the same.    Hospital visits: 07/08/21 - 07/09/21 Medication Reconciliation was completed by comparing discharge summary, patient's EMR and Pharmacy list, and upon discussion with patient.   Admitted to the hospital on 07/08/21 due to Hypertension. Discharge date was 07/09/21. Discharged from [place]?Medications Started at [facility] Discharge:?? None noted.    Medication Changes at Hospital Discharge: None noted.   Medications Discontinued at Hospital Discharge: None noted.    Medications that remain the same after Hospital Discharge:??  All other medications will remain the same.    Objective:  Lab Results  Component Value Date   CREATININE 0.86 02/03/2022   BUN 11 02/03/2022   EGFR 78 05/18/2021   GFRNONAA >60 02/03/2022   GFRAA 74 06/10/2020   NA 141 02/03/2022   K 3.7 02/03/2022   CALCIUM 10.1 02/03/2022   CO2 27 02/03/2022   GLUCOSE 103 (H) 02/03/2022    Lab Results  Component Value Date/Time   HGBA1C 6.1 (H) 11/05/2021 08:19 AM   HGBA1C 5.9 (H) 01/20/2021 10:08 AM   MICROALBUR 0.5 01/20/2021 10:08 AM   MICROALBUR 0.2 09/13/2019 02:39 PM    Last diabetic Eye exam:  Lab Results  Component Value Date/Time   HMDIABEYEEXA No Retinopathy 12/12/2019 04:42 PM    Last diabetic Foot exam: No results found for: "HMDIABFOOTEX"   Lab Results  Component Value Date   CHOL 163 01/20/2021   HDL 60 01/20/2021   LDLCALC 89 01/20/2021   TRIG 62 01/20/2021   CHOLHDL 2.7 01/20/2021       Latest Ref Rng & Units 02/03/2022   12:29 AM 02/01/2022    9:01 AM 10/02/2021    5:35 PM  Hepatic Function  Total Protein 6.5 - 8.1 g/dL 8.4  6.7  7.2    Albumin 3.5 - 5.0 g/dL 4.6   4.2   AST 15 - 41 U/L _0 ALT 0 - 44 U/L _1 Alk Phosphatase 38 - 126 U/L 70   72   Total Bilirubin 0.3 - 1.2 mg/dL 1.0  0.5  0.8     Lab Results  Component Value Date/Time   TSH 1.84 10/29/2019 11:57 AM   TSH 2.11 06/27/2018 08:48 AM       Latest Ref Rng & Units 02/03/2022   12:29 AM 01/19/2022   10:55 PM 10/02/2021    5:35 PM  CBC  WBC 4.0 - 10.5 K/uL 7.5  8.2  7.5   Hemoglobin 12.0 - 15.0 g/dL 13.4  12.8  13.2   Hematocrit 36.0 - 46.0 % 40.7  37.5  40.1   Platelets 150 - 400 K/uL 278  265  247     Lab Results  Component Value Date/Time   VD25OH 38 01/20/2021 10:08 AM    Clinical ASCVD: Yes  The 10-year ASCVD risk score (Arnett DK, et al., 2019) is: 24.5%   Values used to calculate the score:  Age: 89 years     Sex: Female     Is Non-Hispanic African American: Yes     Diabetic: Yes     Tobacco smoker: No     Systolic Blood Pressure: 836 mmHg     Is BP treated: Yes     HDL Cholesterol: 60 mg/dL     Total Cholesterol: 163 mg/dL       02/05/2021   10:38 AM 10/28/2020   12:14 PM 10/29/2019   11:36 AM  Depression screen PHQ 2/9  Decreased Interest 0 0 0  Down, Depressed, Hopeless 0 0 0  PHQ - 2 Score 0 0 0     Social History   Tobacco Use  Smoking Status Former   Packs/day: 0.25   Years: 35.00   Total pack years: 8.75   Types: Cigarettes   Quit date: 12/31/2012   Years since quitting: 9.1  Smokeless Tobacco Never   BP Readings from Last 3 Encounters:  02/22/22 (!) 142/86  02/18/22 (!) 149/70  02/03/22 (!) 146/64   Pulse Readings from Last 3 Encounters:  02/22/22 (!) 59  02/18/22 (!) 54  02/03/22 (!) 49   Wt Readings from Last 3 Encounters:  02/22/22 166 lb (75.3 kg)  02/18/22 168 lb 4.8 oz (76.3 kg)  02/02/22 168 lb (76.2 kg)   BMI Readings from Last 3 Encounters:  02/22/22 26.79 kg/m  02/18/22 27.16 kg/m  02/02/22 27.12 kg/m    Assessment/Interventions: Review of patient past medical  history, allergies, medications, health status, including review of consultants reports, laboratory and other test data, was performed as part of comprehensive evaluation and provision of chronic care management services.   SDOH:  (Social Determinants of Health) assessments and interventions performed: No, done within year  Financial Resource Strain: Low Risk  (12/31/2021)   Overall Financial Resource Strain (CARDIA)    Difficulty of Paying Living Expenses: Not very hard    SDOH Screenings   Alcohol Screen: Low Risk  (10/28/2020)  Depression (PHQ2-9): Low Risk  (02/05/2021)  Financial Resource Strain: Low Risk  (12/31/2021)  Tobacco Use: Medium Risk (02/18/2022)    CCM Care Plan  Allergies  Allergen Reactions   Ciprofloxacin Shortness Of Breath   Latex Other (See Comments)    Burns skin   Vitamin C [Bioflavonoid Products] Itching    Medications Reviewed Today     Reviewed by Edythe Clarity, [facility] (Pharmacist) on 03/04/22 at 1222  Med List Status: <None>   Medication Order Taking? Sig Documenting Provider Last Dose Status Informant  acetaminophen (TYLENOL) 500 MG tablet [id number] Yes Take 500 mg by mouth every 6 (six) hours as needed for moderate pain or headache. [provider] Taking Active Self, Pharmacy Records  ALPRAZolam Duanne Moron) 0.5 MG tablet [id number] Yes TAKE 1 TABLET(0.5 MG) BY MOUTH TWICE DAILY AS NEEDED FOR ANXIETY Susy Frizzle, MD Taking Active   amLODipine (NORVASC) 10 MG tablet [id number] Yes TAKE 1 TABLET(10 MG) BY MOUTH AT BEDTIME Susy Frizzle, MD Taking Active   aspirin EC 81 MG tablet [id number] Yes Take 81 mg by mouth at bedtime.  [provider] Taking Active Self, Pharmacy Records  Azelastine-Fluticasone [facility]) 137-50 MCG/ACT SUSP [id number] Yes Place 2 sprays into the nose daily. Susy Frizzle, MD Taking Active   Calcium 500 MG tablet [id number] Yes Take 500 mg by mouth in the morning and at bedtime. [provider]  Taking Active Self, Pharmacy Records  Carboxymethylcellul-Glycerin ([place]) 1-0.25 % SOLN [id number]  Yes Place 1 drop into both eyes daily as needed (dry eyes). [provider] Taking Active Self, Pharmacy Records  cholecalciferol (VITAMIN D3) 25 MCG (1000 UT) tablet [id number] Yes Take 2,000 Units by mouth daily. [provider] Taking Active Self, Pharmacy Records  cloNIDine (CATAPRES) 0.1 MG tablet [id number] Yes Take 0.1 mg by mouth See admin instructions. Take 1/2 tablet daily as needed for blood pressure [provider] Taking Active Self, Pharmacy Records  Coenzyme Q10 (COQ10) 100 MG CAPS [id number] Yes Take 100 mg by mouth daily. [provider] Taking Active Self, Pharmacy Records  CRANBERRY PO [id number] Yes Take 1 each by mouth 2 (two) times a week. Chewables [provider] Taking Active Self, Pharmacy Records  Cyanocobalamin (B-12) 2500 MCG TABS [id number] Yes Take 2,500 mcg by mouth daily. [provider] Taking Active Self, Pharmacy Records  doxazosin (CARDURA) 4 MG tablet [id number] Yes Take 1 tablet (4 mg total) by mouth daily. Susy Frizzle, MD Taking Active   EPINEPHrine 0.3 mg/0.3 mL IJ SOAJ injection [id number] Yes Inject 0.3 mg into the muscle as needed for anaphylaxis.  [provider] Taking Active Self, Pharmacy Records  furosemide (LASIX) 20 MG tablet [id number] Yes Take 1 tablet (20 mg total) by mouth daily as needed (Weight Gain 3lbs in 24 hrs 5lbs in 1 week). Marylu Lund., NP Taking Active   hydroquinone 4 % cream [id number] Yes Apply 1 application topically 2 (two) times daily as needed (dark spots). [provider] Taking Active Self, Pharmacy Records  hydrOXYzine (VISTARIL) 25 MG capsule [id number] Yes Take 1 capsule (25 mg total) by mouth every 8 (eight) hours as needed. Susy Frizzle, MD Taking Active   ipratropium (ATROVENT) 0.03 % nasal spray [id number] Yes INSTILL 2 SPRAYS IN  [facility] NOSTRIL EVERY 12 HOURS Pickard, Cammie Mcgee, MD Taking Active Self, Pharmacy Records  linaclotide [facility]) 290 MCG CAPS capsule [id number] Yes Take 1 capsule (290 mcg total) by mouth daily before breakfast. Mahala Menghini, PA-C Taking Active Self, Pharmacy Records  loratadine (CLARITIN) 10 MG tablet [id number] Yes Take 10 mg by mouth daily as needed for allergies. [provider] Taking Active Self, Pharmacy Records  MAGNESIUM PO [id number] Yes Take 330 mg by mouth daily. [provider] Taking Active Self, Pharmacy Records  metoprolol succinate (TOPROL-XL) 100 MG 24 hr tablet [id number] Yes TAKE 1 TABLET(100 MG) BY MOUTH DAILY  Patient taking differently: Take 100 mg by mouth daily.   Susy Frizzle, MD Taking Active Self, Pharmacy Records  Omega-3 Fatty Acids (FISH OIL) 1000 MG CAPS [id number] Yes Take 1,000 mg by mouth 3 (three) times a week. [provider] Taking Active Self, Pharmacy Records  ondansetron (ZOFRAN) 4 MG tablet [id number] Yes TAKE 1 TABLET(4 MG) BY MOUTH EVERY 8 HOURS AS NEEDED FOR NAUSEA OR VOMITING Mahala Menghini, PA-C Taking Active Self, Pharmacy Records  pantoprazole (PROTONIX) 40 MG tablet [id number] Yes Take 67m 30 minutes before breakfast and can take additional dose before evening meal if needed. LMahala Menghini PA-C Taking Active   potassium chloride SA (KLOR-CON M) 20 MEQ tablet [id number] TAKE 1 TABLET(20 MEQ) BY MOUTH DAILY. PSusy Frizzle MD Taking Active   rosuvastatin (CRESTOR) 10 MG tablet [id number] Take 1 tablet (10 mg total) by mouth daily. PSusy Frizzle MD Taking Active   sodium chloride (OCEAN) 0.65 % SOLN nasal spray [id number] Place 1 spray into both nostrils as needed for congestion. [provider] Taking  Active Self, Pharmacy Records  topiramate (TOPAMAX) 25 MG tablet [id number] Yes Take 1 tablet (25 mg total) by mouth at bedtime. Rondel Jumbo, PA-C Taking Active   triamcinolone (NASACORT) 55  MCG/ACT AERO nasal inhaler [id number] Yes Place 1 spray into the nose daily as needed (allergies). [provider] Taking Active Self, Pharmacy Records  triamcinolone cream (KENALOG) 0.1 % [id number] Yes Apply 1 Application topically 2 (two) times daily. Susy Frizzle, MD Taking Active   triamterene-hydrochlorothiazide [facility]) 75-50 MG tablet [id number] Yes Take 1 tablet by mouth daily. Susy Frizzle, MD Taking Active Self, Pharmacy Records  valsartan (DIOVAN) 320 MG tablet [id number] Yes TAKE 1 TABLET BY MOUTH EVERY DAY Susy Frizzle, MD Taking Active   vitamin E 400 UNIT capsule [id number] Yes Take 400 Units by mouth 3 (three) times a week. [provider] Taking Active Self, Pharmacy Records  zinc gluconate 50 MG tablet [id number] Yes Take 50 mg by mouth daily. [provider] Taking Active Self, Pharmacy Records            Patient Active Problem List   Diagnosis Date Noted   Frequent headaches 10/12/2021   Non-seasonal allergic rhinitis 05/19/2021   Pulsatile tinnitus of left ear 05/19/2021   Hyperlipidemia    Hypertension    Abdominal aortic aneurysm (AAA) 3.0 cm to 5.0 cm in diameter in female [facility])    Nausea without vomiting 12/06/2018   Loss of weight 12/06/2018   Controlled diabetes mellitus type 2 with complications ([place]) 74/25/9563   Essential hypertension 04/25/2017   Hyperlipidemia LDL goal <70 04/25/2017   Carotid artery stenosis without cerebral infarction, bilateral 04/25/2017   AAA (abdominal aortic aneurysm) without rupture ([place]) 04/25/2017   History of colonic polyps    Diverticulosis of colon without hemorrhage    RUQ pain 02/21/2013   Abdominal pain, epigastric 01/05/2013   Abdominal bruit 01/05/2013   Esophageal dysphagia 01/05/2013   Constipation 01/05/2013   GERD (gastroesophageal reflux disease) 01/05/2013    Immunization History  Administered Date(s) Administered   Fluad Quad(high Dose 65+) 02/14/2020, 02/05/2021    Influenza,inj,Quad PF,6+ Mos 02/05/2017, 02/23/2018, 02/20/2019   Moderna Sars-Covid-2 Vaccination 10/10/2019, 11/08/2019, 05/19/2020   Pneumococcal Conjugate-13 07/26/2017   Pneumococcal Polysaccharide-23 02/14/2020   Tdap 03/28/2017    Conditions to be addressed/monitored:  Lendell Caprice, DM  Care Plan : General Pharmacy (Adult)  Updates made by Edythe Clarity, RPH since 03/04/2022 12:00 AM     Problem: HTN, HLD   Priority: High  Onset Date: 12/31/2021     Long-Range Goal: Patient-Specific Goal   Start Date: 12/31/2021  Expected End Date: 07/03/2022  Recent Progress: On track  Priority: High  Note:   Current Barriers:  Unable to achieve control of BP  Suboptimal therapeutic regimen for lipids  Pharmacist Clinical Goal(s):  Patient will achieve improvement in BP and LDL as evidenced by labs/monitoring through collaboration with PharmD and provider.   Interventions: 1:1 collaboration with Susy Frizzle, MD regarding development and update of comprehensive plan of care as evidenced by provider attestation and co-signature Inter-disciplinary care team collaboration (see longitudinal plan of care) Comprehensive medication review performed; medication list updated in electronic medical record  Hypertension (BP goal <130/80) 03/04/22 -Query controlled, elevated in office - normal at home (hx of white coat syndrome) -Current treatment: Valsartan 354m Appropriate, Effective, Safe, Accessible Metoprolol XL 1040mAppropriate, Effective, Safe, Accessible Amlodipine 1078mppropriate, Query effective, ,  Doxazosin 2mg48m - Appropriate, Query effective,,  -Medications previously tried: none  noted  -Current home readings: 116-120/50-60s -Current dietary habits: same see previous -Current exercise habits: same see previous -Denies hypotensive/hypertensive symptoms -She reports she is not waking up with HA as much as she was before adding doxazosin.  She is not taking amlodipine, does  not sound like she is taking valsartan daily.  She takes her BP meds based on what her current blood pressure is. I would like to see her on regimen where she was not guessing. She seems to like the addition of doxazosin, does mention some dizziness but states she is taking it at night. Hard to determine what she really needs without consistent medication.  We could certainly lower the dose of valsartan if that is dropping her BP too low.  We could also reduce the dose of almodipine to 24m. I would consider valsartan 1637mand amlodipine 56m91mnd then adjust medications based on BP response.  Will consult with PCP.   Hyperlipidemia: (LDL goal < 70) 03/04/22 -Uncontrolled, last lipid panel LDL elevated -Current treatment: Rosuvastatin 56mg66mpropriate, Query effective, ,  -Medications previously tried: none noted  -Patient with carotid artery stenosis, previous symptoms of TIA. -She is now taking the increased dose of Crestor 10mg78mly - reports no adverse effects with medication. -Checked fill history and was filled 01/06/22 for 90ds. -Discussed importance of adherence and risk reduction for statins. -She is due for a lipid panel - would recommend to check at next OV/physical.  12/31/21 ASVCD risk 29.4% - would recommend increase in her statin dose. Dose increased to Crestor 10mg 27my  Patient Goals/Self-Care Activities Patient will:  - take medications as prescribed as evidenced by patient report and record review check blood pressure a few times per week, document, and provide at future appointments  Follow Up Plan: The care management team will reach out to the patient again over the next 90 days.            Medication Assistance: None required.  Patient affirms current coverage meets needs.  Compliance/Adherence/Medication fill history: Care Gaps: Eye exam, DEXA Scan, Foot exam  Star-Rating Drugs: Rosuvastatin 10mg 048m/23 90ds Valsartan 320mg 0951m23 90ds  Patient's  preferred pharmacy is:  [id number] - [place], [place] - [phone] #[id number] - [phone] and Follow Up Patient Decision:  Patient agrees to Care Plan and Follow-up.  Plan: The care management team will reach out to the patient again over the next 90 days.  ChristianBeverly Milch CPP Clinical Pharmacist Practitioner [place] [phone]

## 2022-02-19 ENCOUNTER — Other Ambulatory Visit: Payer: Self-pay

## 2022-02-19 DIAGNOSIS — I6523 Occlusion and stenosis of bilateral carotid arteries: Secondary | ICD-10-CM

## 2022-02-22 ENCOUNTER — Other Ambulatory Visit: Payer: Self-pay | Admitting: Family Medicine

## 2022-02-22 ENCOUNTER — Ambulatory Visit: Payer: Medicare Other | Admitting: Family Medicine

## 2022-02-22 VITALS — BP 142/86 | HR 59 | Wt 166.0 lb

## 2022-02-22 DIAGNOSIS — I10 Essential (primary) hypertension: Secondary | ICD-10-CM

## 2022-02-22 MED ORDER — DOXAZOSIN MESYLATE 4 MG PO TABS: 4.0000 mg | ORAL_TABLET | Freq: Every day | ORAL | 1 refills | Status: DC

## 2022-02-22 NOTE — Progress Notes (Signed)
Subjective:    Patient ID: [name], female    DOB: 01-Jun-1955, 66 y.o.   MRN: [id number] Patient is here today for elevated blood pressures.  She states that when she wakes up in the morning her systolic blood pressure can be between 160 and 200.  She also reports a throbbing headache when that happens.  This been consistently going on now for several weeks.  She has had a CT scan of her adrenal glands in 2020 which showed no adrenal adenoma.  She had a CT angiogram of the abdomen and pelvis in 2021 that showed no evidence of renal artery stenosis.  She had a normal sleep study in the last 4 years and there has not been any significant change in her body habitus.  Therefore we have evaluated her for secondary causes of hypertension found on.  She takes Maxide and valsartan in the morning, metoprolol at 1:00, and amlodipine at bedtime.  Despite that she tends to have high blood pressures in the morning Past Medical History:  Diagnosis Date   Abdominal aortic aneurysm (AAA) 3.0 cm to 5.0 cm in diameter in female [facility])    None seen on prior imaging?    Anxiety    Cataract    Chronic left shoulder pain    Headache    Hiatal hernia    small   Hyperlipidemia    Hypertension    Pre-diabetes    Schatzki's ring    Seasonal allergies    Tremor      Past Surgical History:  Procedure Laterality Date   CARDIAC CATHETERIZATION  2005   "Ms. Charity has essentially normal coronary arteries and normal left ventricular function.  She will be treated empirically with anti-reflux measures"   CATARACT EXTRACTION, BILATERAL     CHOLECYSTECTOMY     COLONOSCOPY    06/16/2004   [facility]:BOFBPZ rectum/Diminutive polyps of the splenic flexure and the cecum/The remainder of the colonic mucosa appeared normal pathology (hyperplastic polyps).   COLONOSCOPY N/A 08/07/2015   Dr. Gala Romney: 6 mm descending colon tubular adenoma, multiple medium-mouthed diverticula in sigmoid colon. surveillance 2022    COLONOSCOPY N/A 04/15/2021   Procedure: COLONOSCOPY;  Surgeon: Daneil Dolin, MD;  Location: [facility];  Service: Endoscopy;  Laterality: N/A;  9:30am   ESOPHAGOGASTRODUODENOSCOPY  09/11/2001   [facility]:ENIDPOEUM ring otherwise normal esophagus/Normal stomach/Normal D1 and D2 status post passage of a 56 French Maloney dilator   ESOPHAGOGASTRODUODENOSCOPY N/A 11/11/2017   Procedure: ESOPHAGOGASTRODUODENOSCOPY (EGD);  Surgeon: Daneil Dolin, MD; mild Schatzki ring s/p dilation and mild erosive gastropathy.   ESOPHAGOGASTRODUODENOSCOPY (EGD) WITH ESOPHAGEAL DILATION N/A 01/25/2013   Dr. Gala Romney- schatzki's ring- 96 F dilation. small hiatal hernia   HYSTEROSCOPY WITH D & C N/A 03/06/2019   Procedure: DILATATION AND CURETTAGE /HYSTEROSCOPY;  Surgeon: Jonnie Kind, MD;  Location: [facility];  Service: Gynecology;  Laterality: N/A;   MALONEY DILATION N/A 11/11/2017   Procedure: [name] DILATION;  Surgeon: Daneil Dolin, MD;  Location: [facility];  Service: Endoscopy;  Laterality: N/A;   POLYPECTOMY N/A 03/06/2019   Procedure: POLYPECTOMY ( REMOVAL OF ENDOMETRIAL POLYP);  Surgeon: Jonnie Kind, MD;  Location: [facility];  Service: Gynecology;  Laterality: N/A;   POLYPECTOMY  04/15/2021   Procedure: POLYPECTOMY;  Surgeon: Daneil Dolin, MD;  Location: [facility];  Service: Endoscopy;;   TUBAL LIGATION     Current Outpatient Medications on File Prior to Visit  Medication Sig Dispense Refill  acetaminophen (TYLENOL) 500 MG tablet Take 500 mg by mouth every 6 (six) hours as needed for moderate pain or headache.     ALPRAZolam (XANAX) 0.5 MG tablet TAKE 1 TABLET(0.5 MG) BY MOUTH TWICE DAILY AS NEEDED FOR ANXIETY 60 tablet 2   amLODipine (NORVASC) 10 MG tablet TAKE 1 TABLET(10 MG) BY MOUTH AT BEDTIME 90 tablet 3   aspirin EC 81 MG tablet Take 81 mg by mouth at bedtime.      Azelastine-Fluticasone (DYMISTA) 137-50 MCG/ACT SUSP Place 2 sprays into the nose daily. 23 g 11   Calcium 500 MG tablet Take  500 mg by mouth in the morning and at bedtime.     Carboxymethylcellul-Glycerin (CLEAR EYES FOR DRY EYES) 1-0.25 % SOLN Place 1 drop into both eyes daily as needed (dry eyes).     cholecalciferol (VITAMIN D3) 25 MCG (1000 UT) tablet Take 2,000 Units by mouth daily.     cloNIDine (CATAPRES) 0.1 MG tablet Take 0.1 mg by mouth See admin instructions. Take 1/2 tablet daily as needed for blood pressure     Coenzyme Q10 (COQ10) 100 MG CAPS Take 100 mg by mouth daily.     CRANBERRY PO Take 1 each by mouth 2 (two) times a week. Chewables     Cyanocobalamin (B-12) 2500 MCG TABS Take 2,500 mcg by mouth daily.     EPINEPHrine 0.3 mg/0.3 mL IJ SOAJ injection Inject 0.3 mg into the muscle as needed for anaphylaxis.      furosemide (LASIX) 20 MG tablet Take 1 tablet (20 mg total) by mouth daily as needed (Weight Gain 3lbs in 24 hrs 5lbs in 1 week). 30 tablet 1   hydroquinone 4 % cream Apply 1 application topically 2 (two) times daily as needed (dark spots).     hydrOXYzine (VISTARIL) 25 MG capsule Take 1 capsule (25 mg total) by mouth every 8 (eight) hours as needed. 30 capsule 0   ipratropium (ATROVENT) 0.03 % nasal spray INSTILL 2 SPRAYS IN EACH NOSTRIL EVERY 12 HOURS 30 mL 12   linaclotide (LINZESS) 290 MCG CAPS capsule Take 1 capsule (290 mcg total) by mouth daily before breakfast. 90 capsule 3   loratadine (CLARITIN) 10 MG tablet Take 10 mg by mouth daily as needed for allergies.     MAGNESIUM PO Take 330 mg by mouth daily.     metoprolol succinate (TOPROL-XL) 100 MG 24 hr tablet TAKE 1 TABLET(100 MG) BY MOUTH DAILY (Patient taking differently: Take 100 mg by mouth daily.) 90 tablet 3   Omega-3 Fatty Acids (FISH OIL) 1000 MG CAPS Take 1,000 mg by mouth 3 (three) times a week.     ondansetron (ZOFRAN) 4 MG tablet TAKE 1 TABLET(4 MG) BY MOUTH EVERY 8 HOURS AS NEEDED FOR NAUSEA OR VOMITING 30 tablet 0   pantoprazole (PROTONIX) 40 MG tablet Take '40mg'$  30 minutes before breakfast and can take additional dose  before evening meal if needed. 60 tablet 5   potassium chloride SA (KLOR-CON M) 20 MEQ tablet TAKE 1 TABLET(20 MEQ) BY MOUTH DAILY. 30 tablet 5   rosuvastatin (CRESTOR) 10 MG tablet Take 1 tablet (10 mg total) by mouth daily. 90 tablet 3   sodium chloride (OCEAN) 0.65 % SOLN nasal spray Place 1 spray into both nostrils as needed for congestion.     topiramate (TOPAMAX) 25 MG tablet Take 1 tablet (25 mg total) by mouth at bedtime. 30 tablet 11   topiramate (TOPAMAX) 50 MG tablet Take 50 mg by mouth daily.  triamcinolone (NASACORT) 55 MCG/ACT AERO nasal inhaler Place 1 spray into the nose daily as needed (allergies).     triamcinolone cream (KENALOG) 0.1 % Apply 1 Application topically 2 (two) times daily. 30 g 0   triamterene-hydrochlorothiazide (MAXZIDE) 75-50 MG tablet Take 1 tablet by mouth daily. 90 tablet 3   valsartan (DIOVAN) 320 MG tablet TAKE 1 TABLET BY MOUTH EVERY DAY 90 tablet 0   vitamin E 400 UNIT capsule Take 400 Units by mouth 3 (three) times a week.     zinc gluconate 50 MG tablet Take 50 mg by mouth daily.     No current facility-administered medications on file prior to visit.     Allergies  Allergen Reactions   Ciprofloxacin Shortness Of Breath   Latex Other (See Comments)    Burns skin   Vitamin C [Bioflavonoid Products] Itching   Social History   Socioeconomic History   Marital status: Single    Spouse name: Not on file   Number of children: 3   Years of education: Not on file   Highest education level: Not on file  Occupational History   Occupation: English as a second language teacher: [employer]  Tobacco Use   Smoking status: Former    Packs/day: 0.25    Years: 35.00    Total pack years: 8.75    Types: Cigarettes    Quit date: 12/31/2012    Years since quitting: 9.1   Smokeless tobacco: Never  Vaping Use   Vaping Use: Never used  Substance and Sexual Activity   Alcohol use: Not Currently   Drug use: No   Sexual activity: Not Currently     Partners: Male    Birth control/protection: Post-menopausal, Surgical    Comment: tubal  Other Topics Concern   Not on file  Social History Narrative   Eats all food groups.    Wears seatbelt.    Has 3 children.   Prior smoker.   Attends church.    Used to work in [employer].    Drives.   Enjoys going out with family.    Right handed   One story home   Social Determinants of Health   Financial Resource Strain: Low Risk  (12/31/2021)   Overall Financial Resource Strain (CARDIA)    Difficulty of Paying Living Expenses: Not very hard  Food Insecurity: Not on file  Transportation Needs: Not on file  Physical Activity: Not on file  Stress: Not on file  Social Connections: Not on file  Intimate Partner Violence: Not on file   Family History  Problem Relation Age of Onset   Diabetes Other    Diabetes Mother    Heart disease Mother    Heart disease Father    Diabetes Sister    Diabetes Brother    Hypertension Brother    Diabetes Daughter    Hypertension Maternal Grandmother    Stroke Maternal Grandfather    Early death Paternal Grandfather    Colon cancer Neg Hx    Liver disease Neg Hx       Review of Systems  All other systems reviewed and are negative.      Objective:   Physical Exam Vitals reviewed.  Constitutional:      Appearance: She is well-developed.  HENT:     Right Ear: Tympanic membrane and ear canal normal.     Left Ear: Tympanic membrane and ear canal normal.     Nose:     Right Sinus: No maxillary sinus  tenderness or frontal sinus tenderness.     Left Sinus: No maxillary sinus tenderness or frontal sinus tenderness.  Eyes:     General: No visual field deficit.    Extraocular Movements:     Right eye: Normal extraocular motion and no nystagmus.     Left eye: Normal extraocular motion and no nystagmus.     Conjunctiva/sclera: Conjunctivae normal.     Pupils:     Right eye: Pupil is round and reactive.     Left eye: Pupil is round and reactive.   Cardiovascular:     Rate and Rhythm: Normal rate and regular rhythm.     Heart sounds: Normal heart sounds. No murmur heard.    No friction rub. No gallop.  Pulmonary:     Effort: Pulmonary effort is normal. No respiratory distress.     Breath sounds: Normal breath sounds. No stridor. No wheezing or rales.  Abdominal:     General: Abdomen is flat. Bowel sounds are normal. There is no distension.     Palpations: Abdomen is soft.     Tenderness: There is no abdominal tenderness. There is no guarding.  Musculoskeletal:     Right hand: Normal. No swelling, deformity, tenderness or bony tenderness. Normal range of motion.     Left hand: Normal. No swelling, deformity, tenderness or bony tenderness. Normal range of motion.     Cervical back: Normal range of motion. No rigidity.  Neurological:     Mental Status: She is alert and oriented to person, place, and time. Mental status is at baseline.     Cranial Nerves: No cranial nerve deficit, dysarthria or facial asymmetry.     Sensory: No sensory deficit.     Motor: Tremor present. No weakness or abnormal muscle tone.     Coordination: Romberg sign negative. Coordination normal.     Gait: Gait normal.           Assessment & Plan:  Benign essential HTN - Plan: Home sleep test Decision between doxazosin and spironolactone.  We will try doxazosin 4 mg p.o. nightly and reassess in the next 2 weeks.  Schedule patient for home sleep study to evaluate for any potential sleep apnea given the fact this tends to happen at night while she is asleep.  I feel that we have thoroughly evaluated her for secondary causes of hypertension as outlined in the history of present illness

## 2022-02-25 ENCOUNTER — Telehealth: Payer: Self-pay | Admitting: Pharmacist

## 2022-02-25 NOTE — Progress Notes (Signed)
Erroneous Encounter

## 2022-03-04 ENCOUNTER — Ambulatory Visit: Payer: Medicare Other | Admitting: Pharmacist

## 2022-03-04 ENCOUNTER — Encounter (HOSPITAL_COMMUNITY): Payer: Self-pay

## 2022-03-04 ENCOUNTER — Other Ambulatory Visit: Payer: Self-pay | Admitting: Family Medicine

## 2022-03-04 DIAGNOSIS — E785 Hyperlipidemia, unspecified: Secondary | ICD-10-CM

## 2022-03-04 DIAGNOSIS — F419 Anxiety disorder, unspecified: Secondary | ICD-10-CM

## 2022-03-04 DIAGNOSIS — I10 Essential (primary) hypertension: Secondary | ICD-10-CM

## 2022-03-04 NOTE — Patient Instructions (Addendum)
Visit Information   Goals Addressed             This Visit's Progress    Track and Manage My Blood Pressure-Hypertension   On track    Timeframe:  Long-Range Goal Priority:  High Start Date:    12/31/21                         Expected End Date:    07/03/22                   Follow Up Date 04/02/22    - check blood pressure daily    Why is this important?   You won't feel high blood pressure, but it can still hurt your blood vessels.  High blood pressure can cause heart or kidney problems. It can also cause a stroke.  Making lifestyle changes like losing a little weight or eating less salt will help.  Checking your blood pressure at home and at different times of the day can help to control blood pressure.  If the doctor prescribes medicine remember to take it the way the doctor ordered.  Call the office if you cannot afford the medicine or if there are questions about it.     Notes:        Patient Care Plan: General Pharmacy (Adult)     Problem Identified: HTN, HLD   Priority: High  Onset Date: 12/31/2021     Long-Range Goal: Patient-Specific Goal   Start Date: 12/31/2021  Expected End Date: 07/03/2022  Recent Progress: On track  Priority: High  Note:   Current Barriers:  Unable to achieve control of BP  Suboptimal therapeutic regimen for lipids  Pharmacist Clinical Goal(s):  Patient will achieve improvement in BP and LDL as evidenced by labs/monitoring through collaboration with PharmD and provider.   Interventions: 1:1 collaboration with Susy Frizzle, MD regarding development and update of comprehensive plan of care as evidenced by provider attestation and co-signature Inter-disciplinary care team collaboration (see longitudinal plan of care) Comprehensive medication review performed; medication list updated in electronic medical record  Hypertension (BP goal <130/80) 03/04/22 -Query controlled, elevated in office - normal at home (hx of white coat  syndrome) -Current treatment: Valsartan '320mg'$  Appropriate, Effective, Safe, Accessible Metoprolol XL '100mg'$  Appropriate, Effective, Safe, Accessible Amlodipine '10mg'$  Appropriate, Query effective, ,  Doxazosin '2mg'$  hs - Appropriate, Query effective,,  -Medications previously tried: none noted  -Current home readings: 116-120/50-60s -Current dietary habits: same see previous -Current exercise habits: same see previous -Denies hypotensive/hypertensive symptoms -She reports she is not waking up with HA as much as she was before adding doxazosin.  She is not taking amlodipine, does not sound like she is taking valsartan daily.  She takes her BP meds based on what her current blood pressure is. I would like to see her on regimen where she was not guessing. She seems to like the addition of doxazosin, does mention some dizziness but states she is taking it at night. Hard to determine what she really needs without consistent medication.  We could certainly lower the dose of valsartan if that is dropping her BP too low.  We could also reduce the dose of almodipine to '5mg'$ . I would consider valsartan '160mg'$  and amlodipine '5mg'$  and then adjust medications based on BP response.  Will consult with PCP.   Hyperlipidemia: (LDL goal < 70) 03/04/22 -Uncontrolled, last lipid panel LDL elevated -Current treatment: Rosuvastatin '5mg'$  Appropriate, Query effective, ,  -  Medications previously tried: none noted  -Patient with carotid artery stenosis, previous symptoms of TIA. -She is now taking the increased dose of Crestor '10mg'$  daily - reports no adverse effects with medication. -Checked fill history and was filled 01/06/22 for 90ds. -Discussed importance of adherence and risk reduction for statins. -She is due for a lipid panel - would recommend to check at next OV/physical.  12/31/21 ASVCD risk 29.4% - would recommend increase in her statin dose. Dose increased to Crestor '10mg'$  daily  Patient Goals/Self-Care  Activities Patient will:  - take medications as prescribed as evidenced by patient report and record review check blood pressure a few times per week, document, and provide at future appointments  Follow Up Plan: The care management team will reach out to the patient again over the next 90 days.           The patient verbalized understanding of instructions, educational materials, and care plan provided today and DECLINED offer to receive copy of patient instructions, educational materials, and care plan.  Telephone follow up appointment with pharmacy team member scheduled for: 3 months  [name], [place], PharmD, [place] Clinical Pharmacist Practitioner [place] [phone]

## 2022-03-10 ENCOUNTER — Telehealth: Payer: Self-pay

## 2022-03-10 NOTE — Telephone Encounter (Signed)
BP MEDICATION UPDATE:  Pt called and states that she is having dizziness, blurred vision and headache since starting Cardura 2 mg. Does patient need to come back in? Thank you.

## 2022-03-12 ENCOUNTER — Ambulatory Visit (INDEPENDENT_AMBULATORY_CARE_PROVIDER_SITE_OTHER): Payer: Medicare Other | Admitting: Family Medicine

## 2022-03-12 VITALS — BP 158/82 | HR 61 | Ht 66.0 in | Wt 166.0 lb

## 2022-03-12 DIAGNOSIS — I10 Essential (primary) hypertension: Secondary | ICD-10-CM

## 2022-03-12 MED ORDER — SPIRONOLACTONE 25 MG PO TABS: 25.0000 mg | ORAL_TABLET | Freq: Every day | ORAL | 3 refills | Status: DC

## 2022-03-12 MED ORDER — EPINEPHRINE 0.3 MG/0.3ML IJ SOAJ: 0.3000 mg | INTRAMUSCULAR | 1 refills | Status: AC | PRN

## 2022-03-12 NOTE — Progress Notes (Signed)
Subjective:    Patient ID: [name], female    DOB: Oct 20, 1955, 66 y.o.   MRN: [id number] Please see the patient's last visit.  At that time she was reporting systolic blood pressures between 160 and 200.  We added doxazosin.  After starting doxazosin, the patient reported dizziness, she reports blurry vision.  She reported feeling extremely tired and weak.  Over the last 2 days she has stopped doxazosin.  The blurry vision is gone away.  She still feels a little dizzy however her systolic blood pressure at home today was 170 and here today in office it is 158.  She is on potassium in addition to try and tearing Past Medical History:  Diagnosis Date   Abdominal aortic aneurysm (AAA) 3.0 cm to 5.0 cm in diameter in female [facility])    None seen on prior imaging?    Anxiety    Cataract    Chronic left shoulder pain    Headache    Hiatal hernia    small   Hyperlipidemia    Hypertension    Pre-diabetes    Schatzki's ring    Seasonal allergies    Tremor      Past Surgical History:  Procedure Laterality Date   CARDIAC CATHETERIZATION  2005   "Ms. Cousar has essentially normal coronary arteries and normal left ventricular function.  She will be treated empirically with anti-reflux measures"   CATARACT EXTRACTION, BILATERAL     CHOLECYSTECTOMY     COLONOSCOPY    06/16/2004   [id number] rectum/Diminutive polyps of the splenic flexure and the cecum/The remainder of the colonic mucosa appeared normal pathology (hyperplastic polyps).   COLONOSCOPY N/A 08/07/2015   Dr. Gala Romney: 6 mm descending colon tubular adenoma, multiple medium-mouthed diverticula in sigmoid colon. surveillance 2022   COLONOSCOPY N/A 04/15/2021   Procedure: COLONOSCOPY;  Surgeon: Daneil Dolin, MD;  [facility];  Service: Endoscopy;  Laterality: N/A;  9:30am   ESOPHAGOGASTRODUODENOSCOPY  09/11/2001   [id number] ring otherwise normal esophagus/Normal stomach/Normal D1 and D2 status post passage of a  56 French Maloney dilator   ESOPHAGOGASTRODUODENOSCOPY N/A 11/11/2017   Procedure: ESOPHAGOGASTRODUODENOSCOPY (EGD);  Surgeon: Daneil Dolin, MD; mild Schatzki ring s/p dilation and mild erosive gastropathy.   ESOPHAGOGASTRODUODENOSCOPY (EGD) WITH ESOPHAGEAL DILATION N/A 01/25/2013   Dr. Gala Romney- schatzki's ring- 58 F dilation. small hiatal hernia   HYSTEROSCOPY WITH D & C N/A 03/06/2019   Procedure: DILATATION AND CURETTAGE /HYSTEROSCOPY;  Surgeon: Jonnie Kind, MD;  Location: [facility];  Service: Gynecology;  Laterality: N/A;   MALONEY DILATION N/A 11/11/2017   Procedure: [name] DILATION;  Surgeon: Daneil Dolin, MD;  [facility];  Service: Endoscopy;  Laterality: N/A;   POLYPECTOMY N/A 03/06/2019   Procedure: POLYPECTOMY ( REMOVAL OF ENDOMETRIAL POLYP);  Surgeon: Jonnie Kind, MD;  Location: [facility];  Service: Gynecology;  Laterality: N/A;   POLYPECTOMY  04/15/2021   Procedure: POLYPECTOMY;  Surgeon: Daneil Dolin, MD;  [facility];  Service: Endoscopy;;   TUBAL LIGATION     Current Outpatient Medications on File Prior to Visit  Medication Sig Dispense Refill   acetaminophen (TYLENOL) 500 MG tablet Take 500 mg by mouth every 6 (six) hours as needed for moderate pain or headache.     ALPRAZolam (XANAX) 0.5 MG tablet TAKE 1 TABLET(0.5 MG) BY MOUTH TWICE DAILY AS NEEDED FOR ANXIETY 60 tablet 2   amLODipine (NORVASC) 10 MG tablet TAKE 1 TABLET(10 MG) BY  MOUTH AT BEDTIME 90 tablet 3   aspirin EC 81 MG tablet Take 81 mg by mouth at bedtime.      Azelastine-Fluticasone (DYMISTA) 137-50 MCG/ACT SUSP Place 2 sprays into the nose daily. 23 g 11   Calcium 500 MG tablet Take 500 mg by mouth in the morning and at bedtime.     Carboxymethylcellul-Glycerin (CLEAR EYES FOR DRY EYES) 1-0.25 % SOLN Place 1 drop into both eyes daily as needed (dry eyes).     cholecalciferol (VITAMIN D3) 25 MCG (1000 UT) tablet Take 2,000 Units by mouth daily.     cloNIDine (CATAPRES) 0.1 MG tablet  Take 0.1 mg by mouth See admin instructions. Take 1/2 tablet daily as needed for blood pressure     Coenzyme Q10 (COQ10) 100 MG CAPS Take 100 mg by mouth daily.     CRANBERRY PO Take 1 each by mouth 2 (two) times a week. Chewables     Cyanocobalamin (B-12) 2500 MCG TABS Take 2,500 mcg by mouth daily.     furosemide (LASIX) 20 MG tablet Take 1 tablet (20 mg total) by mouth daily as needed (Weight Gain 3lbs in 24 hrs 5lbs in 1 week). 30 tablet 1   hydroquinone 4 % cream Apply 1 application topically 2 (two) times daily as needed (dark spots).     hydrOXYzine (VISTARIL) 25 MG capsule Take 1 capsule (25 mg total) by mouth every 8 (eight) hours as needed. 30 capsule 0   ipratropium (ATROVENT) 0.03 % nasal spray INSTILL 2 SPRAYS IN EACH NOSTRIL EVERY 12 HOURS 30 mL 12   linaclotide (LINZESS) 290 MCG CAPS capsule Take 1 capsule (290 mcg total) by mouth daily before breakfast. 90 capsule 3   loratadine (CLARITIN) 10 MG tablet Take 10 mg by mouth daily as needed for allergies.     MAGNESIUM PO Take 330 mg by mouth daily.     metoprolol succinate (TOPROL-XL) 100 MG 24 hr tablet TAKE 1 TABLET(100 MG) BY MOUTH DAILY (Patient taking differently: Take 100 mg by mouth daily.) 90 tablet 3   Omega-3 Fatty Acids (FISH OIL) 1000 MG CAPS Take 1,000 mg by mouth 3 (three) times a week.     ondansetron (ZOFRAN) 4 MG tablet TAKE 1 TABLET(4 MG) BY MOUTH EVERY 8 HOURS AS NEEDED FOR NAUSEA OR VOMITING 30 tablet 0   pantoprazole (PROTONIX) 40 MG tablet Take '40mg'$  30 minutes before breakfast and can take additional dose before evening meal if needed. 60 tablet 5   rosuvastatin (CRESTOR) 10 MG tablet Take 1 tablet (10 mg total) by mouth daily. 90 tablet 3   sodium chloride (OCEAN) 0.65 % SOLN nasal spray Place 1 spray into both nostrils as needed for congestion.     topiramate (TOPAMAX) 25 MG tablet Take 1 tablet (25 mg total) by mouth at bedtime. 30 tablet 11   triamcinolone (NASACORT) 55 MCG/ACT AERO nasal inhaler Place 1  spray into the nose daily as needed (allergies).     triamcinolone cream (KENALOG) 0.1 % Apply 1 Application topically 2 (two) times daily. 30 g 0   triamterene-hydrochlorothiazide (MAXZIDE) 75-50 MG tablet Take 1 tablet by mouth daily. 90 tablet 3   valsartan (DIOVAN) 320 MG tablet TAKE 1 TABLET BY MOUTH EVERY DAY 90 tablet 0   vitamin E 400 UNIT capsule Take 400 Units by mouth 3 (three) times a week.     zinc gluconate 50 MG tablet Take 50 mg by mouth daily.     No current facility-administered medications  on file prior to visit.     Allergies  Allergen Reactions   Ciprofloxacin Shortness Of Breath   Latex Other (See Comments)    Burns skin   Vitamin C [Bioflavonoid Products] Itching   Social History   Socioeconomic History   Marital status: Single    Spouse name: Not on file   Number of children: 3   Years of education: Not on file   Highest education level: Not on file  Occupational History   Occupation: English as a second language teacher: [employer]  Tobacco Use   Smoking status: Former    Packs/day: 0.25    Years: 35.00    Total pack years: 8.75    Types: Cigarettes    Quit date: 12/31/2012    Years since quitting: 9.2   Smokeless tobacco: Never  Vaping Use   Vaping Use: Never used  Substance and Sexual Activity   Alcohol use: Not Currently   Drug use: No   Sexual activity: Not Currently    Partners: Male    Birth control/protection: Post-menopausal, Surgical    Comment: tubal  Other Topics Concern   Not on file  Social History Narrative   Eats all food groups.    Wears seatbelt.    Has 3 children.   Prior smoker.   Attends church.    Used to work in [employer].    Drives.   Enjoys going out with family.    Right handed   One story home   Social Determinants of Health   Financial Resource Strain: Low Risk  (12/31/2021)   Overall Financial Resource Strain (CARDIA)    Difficulty of Paying Living Expenses: Not very hard  Food Insecurity: Not on file   Transportation Needs: Not on file  Physical Activity: Not on file  Stress: Not on file  Social Connections: Not on file  Intimate Partner Violence: Not on file   Family History  Problem Relation Age of Onset   Diabetes Other    Diabetes Mother    Heart disease Mother    Heart disease Father    Diabetes Sister    Diabetes Brother    Hypertension Brother    Diabetes Daughter    Hypertension Maternal Grandmother    Stroke Maternal Grandfather    Early death Paternal Grandfather    Colon cancer Neg Hx    Liver disease Neg Hx       Review of Systems  All other systems reviewed and are negative.      Objective:   Physical Exam Vitals reviewed.  Constitutional:      Appearance: She is well-developed.  HENT:     Right Ear: Tympanic membrane and ear canal normal.     Left Ear: Tympanic membrane and ear canal normal.     Nose:     Right Sinus: No maxillary sinus tenderness or frontal sinus tenderness.     Left Sinus: No maxillary sinus tenderness or frontal sinus tenderness.  Eyes:     General: No visual field deficit.    Extraocular Movements:     Right eye: Normal extraocular motion and no nystagmus.     Left eye: Normal extraocular motion and no nystagmus.     Conjunctiva/sclera: Conjunctivae normal.     Pupils:     Right eye: Pupil is round and reactive.     Left eye: Pupil is round and reactive.  Cardiovascular:     Rate and Rhythm: Normal rate and regular rhythm.  Heart sounds: Normal heart sounds. No murmur heard.    No friction rub. No gallop.  Pulmonary:     Effort: Pulmonary effort is normal. No respiratory distress.     Breath sounds: Normal breath sounds. No stridor. No wheezing or rales.  Abdominal:     General: Abdomen is flat. Bowel sounds are normal. There is no distension.     Palpations: Abdomen is soft.     Tenderness: There is no abdominal tenderness. There is no guarding.  Musculoskeletal:     Right hand: Normal. No swelling, deformity,  tenderness or bony tenderness. Normal range of motion.     Left hand: Normal. No swelling, deformity, tenderness or bony tenderness. Normal range of motion.     Cervical back: Normal range of motion. No rigidity.  Neurological:     Mental Status: She is alert and oriented to person, place, and time. Mental status is at baseline.     Cranial Nerves: No cranial nerve deficit, dysarthria or facial asymmetry.     Sensory: No sensory deficit.     Motor: Tremor present. No weakness or abnormal muscle tone.     Coordination: Romberg sign negative. Coordination normal.     Gait: Gait normal.           Assessment & Plan:  Hypertension, unspecified type Of asked the patient to stop doxazosin.  I want her to stop potassium supplement.  Start spironolactone 25 mg daily and recheck a BMP and blood pressure in 1 week.

## 2022-03-15 ENCOUNTER — Other Ambulatory Visit: Payer: Medicare Other

## 2022-03-16 ENCOUNTER — Other Ambulatory Visit: Payer: Medicare Other

## 2022-03-19 ENCOUNTER — Telehealth: Payer: Self-pay

## 2022-03-19 NOTE — Telephone Encounter (Signed)
Pt called in stating that the med spironolactone (ALDACTONE) 25 MG tablet doesn't seem to be lowering her bp after taking this med for a week. Pt just want pcp to be aware that she feels this med may not be working for her. Please advise.  Cb#: [phone]

## 2022-03-21 NOTE — Progress Notes (Unsigned)
Office Visit    Patient Name: [name] Date of Encounter: 03/22/2022  Primary Care Provider:  Susy Frizzle, MD Primary Cardiologist:  Jenkins Rouge, MD Primary Electrophysiologist: None  Chief Complaint    [name] is a 66 y.o. female with PMH of HTN, HLD, GERD, PVD, who presents for 8-week follow-up of chest pain and shortness of breath.  Past Medical History    Past Medical History:  Diagnosis Date   Abdominal aortic aneurysm (AAA) 3.0 cm to 5.0 cm in diameter in female [facility])    None seen on prior imaging?    Anxiety    Cataract    Chronic left shoulder pain    Headache    Hiatal hernia    small   Hyperlipidemia    Hypertension    Pre-diabetes    Schatzki's ring    Seasonal allergies    Tremor    Past Surgical History:  Procedure Laterality Date   CARDIAC CATHETERIZATION  2005   "[name] has essentially normal coronary arteries and normal left ventricular function.  She will be treated empirically with anti-reflux measures"   CATARACT EXTRACTION, BILATERAL     CHOLECYSTECTOMY     COLONOSCOPY    06/16/2004   ZOX:[id number] rectum/Diminutive polyps of the splenic flexure and the cecum/The remainder of the colonic mucosa appeared normal pathology (hyperplastic polyps).   COLONOSCOPY N/A 08/07/2015   Dr. Gala Romney: 6 mm descending colon tubular adenoma, multiple medium-mouthed diverticula in sigmoid colon. surveillance 2022   COLONOSCOPY N/A 04/15/2021   Procedure: COLONOSCOPY;  Surgeon: Daneil Dolin, MD;  [facility];  Service: Endoscopy;  Laterality: N/A;  9:30am   ESOPHAGOGASTRODUODENOSCOPY  09/11/2001   [id number]:[id number] ring otherwise normal esophagus/Normal stomach/Normal D1 and D2 status post passage of a 56 French Maloney dilator   ESOPHAGOGASTRODUODENOSCOPY N/A 11/11/2017   Procedure: ESOPHAGOGASTRODUODENOSCOPY (EGD);  Surgeon: Daneil Dolin, MD; mild Schatzki ring s/p dilation and mild erosive gastropathy.    ESOPHAGOGASTRODUODENOSCOPY (EGD) WITH ESOPHAGEAL DILATION N/A 01/25/2013   Dr. Gala Romney- schatzki's ring- 35 F dilation. small hiatal hernia   HYSTEROSCOPY WITH D & C N/A 03/06/2019   Procedure: DILATATION AND CURETTAGE /HYSTEROSCOPY;  Surgeon: Jonnie Kind, MD;  Location: [facility];  Service: Gynecology;  Laterality: N/A;   MALONEY DILATION N/A 11/11/2017   Procedure: [name] DILATION;  Surgeon: Daneil Dolin, MD;  [facility];  Service: Endoscopy;  Laterality: N/A;   POLYPECTOMY N/A 03/06/2019   Procedure: POLYPECTOMY ( REMOVAL OF ENDOMETRIAL POLYP);  Surgeon: Jonnie Kind, MD;  Location: [facility];  Service: Gynecology;  Laterality: N/A;   POLYPECTOMY  04/15/2021   Procedure: POLYPECTOMY;  Surgeon: Daneil Dolin, MD;  [facility];  Service: Endoscopy;;   TUBAL LIGATION      Allergies  Allergies  Allergen Reactions   Ciprofloxacin Shortness Of Breath   Latex Other (See Comments)    Burns skin   Vitamin C [Bioflavonoid Products] Itching    History of Present Illness    [name] has a PMH of is a 66 year old female with the above mention past medical history who presents today for posthospital follow-up of chest pain.  [name] was initially seen 01/2019 for complaint of hypertension.  She had systolic blood pressures in the 190s and was also complaining of fluttering in chest.  She had a normal cardiac cath completed 2014 due to chest pain that later was revealed to be GI related.  2D echo was ordered with EF of 60% and LVH noted, and nonischemic Myoview was also completed 03/2020.  She was seen in the ED on 05/2020 due to increased BP and chronic headache with numbness in the face. She was seen in the ED on 09/18/2021 with complaint of chest pain.  She described the pain as tightness and heaviness that has worsened over the past couple weeks.  She was noted to have trace edema in lower extremities.  BNP today slightly elevated at 122.7.  Patient was  discharged following negative work-up for ACS and PE.  Patient advised to follow-up with cardiology.  She was seen by me on 01/22/2022 for complaint of shortness of breath with chest tightness.  During visit patient's blood pressures were elevated at 160/80 and remained elevated on recheck at 172/84.  She was volume up on exam and was started on Lasix 20 mg as needed.  2D echo was ordered that showed no change from 2020 study.  Cardiac CT was ordered but patient never completed the study.  She was seen by her PCP and was started on doxazosin but experienced side effects and this was discontinued and she was started on spironolactone.  [name] presents today for 32-monthfollow-up alone.  Since last being seen in the office patient reports she has been experiencing labile blood pressures and BP today is elevated at 170/62 and was 158/82 on recheck.  She also endorsed chest pressure and 1 episode of sharp chest pain that occurred on Sunday prior to going to church.  She denies any palpitations or dizziness.  She had self discontinued her spironolactone and states that she felt her pressure was actually increasing after 1 week.  I encouraged her to not make changes to her blood pressure medicines without advising her PCP.  She is euvolemic on exam and states her swelling has been much improved since her previous visit.  She is compliant with her additional medications and denies any other adverse reactions..  Patient denies chest pain, palpitations, dyspnea, PND, orthopnea, nausea, vomiting, dizziness, syncope, edema, weight gain, or early satiety.  Home Medications    Current Outpatient Medications  Medication Sig Dispense Refill   acetaminophen (TYLENOL) 500 MG tablet Take 500 mg by mouth every 6 (six) hours as needed for moderate pain or headache.     ALPRAZolam (XANAX) 0.5 MG tablet TAKE 1 TABLET(0.5 MG) BY MOUTH TWICE DAILY AS NEEDED FOR ANXIETY 60 tablet 2   amLODipine (NORVASC) 10 MG tablet TAKE 1  TABLET(10 MG) BY MOUTH AT BEDTIME 90 tablet 3   aspirin EC 81 MG tablet Take 81 mg by mouth at bedtime.      Azelastine-Fluticasone (DYMISTA) 137-50 MCG/ACT SUSP Place 2 sprays into the nose daily. 23 g 11   Calcium 500 MG tablet Take 500 mg by mouth in the morning and at bedtime.     Carboxymethylcellul-Glycerin (CLEAR EYES FOR DRY EYES) 1-0.25 % SOLN Place 1 drop into both eyes daily as needed (dry eyes).     carvedilol (COREG) 3.125 MG tablet Take 1 tablet (3.125 mg total) by mouth 2 (two) times daily. 60 tablet 1   cholecalciferol (VITAMIN D3) 25 MCG (1000 UT) tablet Take 2,000 Units by mouth daily.     cloNIDine (CATAPRES) 0.1 MG tablet Take 0.1 mg by mouth See admin instructions. Take 1/2 tablet daily as needed for blood pressure     Coenzyme Q10 (COQ10) 100 MG CAPS Take 100 mg by mouth daily.  CRANBERRY PO Take 1 each by mouth 2 (two) times a week. Chewables     Cyanocobalamin (B-12) 2500 MCG TABS Take 2,500 mcg by mouth daily.     EPINEPHrine 0.3 mg/0.3 mL IJ SOAJ injection Inject 0.3 mg into the muscle as needed for anaphylaxis. 1 each 1   furosemide (LASIX) 20 MG tablet Take 1 tablet (20 mg total) by mouth daily as needed (Weight Gain 3lbs in 24 hrs 5lbs in 1 week). 30 tablet 1   hydroquinone 4 % cream Apply 1 application topically 2 (two) times daily as needed (dark spots).     hydrOXYzine (VISTARIL) 25 MG capsule Take 1 capsule (25 mg total) by mouth every 8 (eight) hours as needed. 30 capsule 0   ipratropium (ATROVENT) 0.03 % nasal spray INSTILL 2 SPRAYS IN EACH NOSTRIL EVERY 12 HOURS 30 mL 12   linaclotide (LINZESS) 290 MCG CAPS capsule Take 1 capsule (290 mcg total) by mouth daily before breakfast. 90 capsule 3   loratadine (CLARITIN) 10 MG tablet Take 10 mg by mouth daily as needed for allergies.     MAGNESIUM PO Take 330 mg by mouth daily.     Omega-3 Fatty Acids (FISH OIL) 1000 MG CAPS Take 1,000 mg by mouth 3 (three) times a week.     ondansetron (ZOFRAN) 4 MG tablet TAKE 1  TABLET(4 MG) BY MOUTH EVERY 8 HOURS AS NEEDED FOR NAUSEA OR VOMITING 30 tablet 0   pantoprazole (PROTONIX) 40 MG tablet Take '40mg'$  30 minutes before breakfast and can take additional dose before evening meal if needed. 60 tablet 5   rosuvastatin (CRESTOR) 10 MG tablet Take 1 tablet (10 mg total) by mouth daily. 90 tablet 3   sodium chloride (OCEAN) 0.65 % SOLN nasal spray Place 1 spray into both nostrils as needed for congestion.     topiramate (TOPAMAX) 25 MG tablet Take 1 tablet (25 mg total) by mouth at bedtime. 30 tablet 11   triamcinolone (NASACORT) 55 MCG/ACT AERO nasal inhaler Place 1 spray into the nose daily as needed (allergies).     triamcinolone cream (KENALOG) 0.1 % Apply 1 Application topically 2 (two) times daily. 30 g 0   valsartan (DIOVAN) 320 MG tablet TAKE 1 TABLET BY MOUTH EVERY DAY 90 tablet 0   vitamin E 400 UNIT capsule Take 400 Units by mouth 3 (three) times a week.     zinc gluconate 50 MG tablet Take 50 mg by mouth daily.     spironolactone (ALDACTONE) 25 MG tablet Take 1 tablet (25 mg total) by mouth daily. (Patient not taking: Reported on 03/22/2022) 90 tablet 3   No current facility-administered medications for this visit.     Review of Systems  Please see the history of present illness.    (+) Chest pressure (+) Trace edema in lower extremities  All other systems reviewed and are otherwise negative except as noted above.  Physical Exam    Wt Readings from Last 3 Encounters:  03/22/22 167 lb (75.8 kg)  03/12/22 166 lb (75.3 kg)  02/22/22 166 lb (75.3 kg)   VS: Vitals:   03/22/22 1026 03/22/22 1829  BP: (!) 170/62 (!) 172/84  Pulse: 70   SpO2: 95%   ,Body mass index is 26.95 kg/m.  Constitutional:      Appearance: Healthy appearance. Not in distress.  Neck:     Vascular: JVD normal.  Pulmonary:     Effort: Pulmonary effort is normal.     Breath sounds: No  wheezing. No rales. Diminished in the bases Cardiovascular:     Normal rate. Regular  rhythm. Normal S1. Normal S2.  Bruit noted bilaterally    Murmurs: There is no murmur.  Edema:    Peripheral edema absent.  Abdominal:     Palpations: Abdomen is soft non tender. There is no hepatomegaly.  Skin:    General: Skin is warm and dry.  Neurological:     General: No focal deficit present.     Mental Status: Alert and oriented to person, place and time.     Cranial Nerves: Cranial nerves are intact.  EKG/LABS/Other Studies Reviewed    ECG personally reviewed by me today -none completed today  Lab Results  Component Value Date   WBC 7.5 02/03/2022   HGB 13.4 02/03/2022   HCT 40.7 02/03/2022   MCV 90.2 02/03/2022   PLT 278 02/03/2022   Lab Results  Component Value Date   CREATININE 0.86 02/03/2022   BUN 11 02/03/2022   NA 141 02/03/2022   K 3.7 02/03/2022   CL 104 02/03/2022   CO2 27 02/03/2022   Lab Results  Component Value Date   ALT 22 02/03/2022   AST 22 02/03/2022   ALKPHOS 70 02/03/2022   BILITOT 1.0 02/03/2022   Lab Results  Component Value Date   CHOL 163 01/20/2021   HDL 60 01/20/2021   LDLCALC 89 01/20/2021   TRIG 62 01/20/2021   CHOLHDL 2.7 01/20/2021    Lab Results  Component Value Date   HGBA1C 6.1 (H) 11/05/2021    Assessment & Plan    1.  Essential hypertension: -HYPERTENSION CONTROL Vitals:   03/22/22 1026 03/22/22 1829  BP: (!) 170/62 (!) 172/84    The patient's blood pressure is elevated above target today.  In order to address the patient's elevated BP: A current anti-hypertensive medication was adjusted today.; A new medication was prescribed today.; Blood pressure will be monitored at home to determine if medication changes need to be made.      -She will resume her spironolactone 25 mg today and will discontinue Maxide -She will stop Toprol 100 mg daily and will start Coreg 3.125 mg twice daily  2.  Atypical chest pain: -Last ischemic evaluation completed 08/2020 and showed normal perfusion with no ischemia low risk  study  -Today patient reports some chest pressure and 1 episode of sharp chest pain following church.  Today she endorses no chest pain currently. -We will send for cardiac PET stress test to evaluate for possible ischemia -She was advised to seek care at the ED if chest pain becomes worse and is not relieved with rest.  3.  Lower extremity edema/intermittent claudication: -Patient was seen by VVSand encouraged to complete walking program with follow-up ABIs/TBI's in 1 year  4..  Hyperlipidemia: -LDL cholesterol was 89 on 01/2021 -Continue Crestor 10 mg    5.  Carotid artery stenosis: -right ICA having 60-79% stenosis and the left having 40-59% stenosis -patient will have follow-up carotid duplex in 6 months  Disposition: Follow-up with Jenkins Rouge, MD as scheduled Shared Decision Making/Informed Consent The risks [chest pain, shortness of breath, cardiac arrhythmias, dizziness, blood pressure fluctuations, myocardial infarction, stroke/transient ischemic attack, nausea, vomiting, allergic reaction, radiation exposure, metallic taste sensation and life-threatening complications (estimated to be 1 in 10,000)], benefits (risk stratification, diagnosing coronary artery disease, treatment guidance) and alternatives of a cardiac PET stress test were discussed in detail with [name] and she agrees to proceed.   Medication  Adjustments/Labs and Tests Ordered: Current medicines are reviewed at length with the patient today.  Concerns regarding medicines are outlined above.   Signed, Mable Fill, Marissa Nestle, NP 03/22/2022, 6:31 PM [facility]  Note:  This document was prepared using Dragon voice recognition software and may include unintentional dictation errors.

## 2022-03-22 ENCOUNTER — Other Ambulatory Visit: Payer: Self-pay | Admitting: Nurse Practitioner

## 2022-03-22 ENCOUNTER — Encounter: Payer: Self-pay | Admitting: Nurse Practitioner

## 2022-03-22 ENCOUNTER — Ambulatory Visit: Payer: Medicare Other | Attending: Nurse Practitioner | Admitting: Nurse Practitioner

## 2022-03-22 VITALS — BP 172/84 | HR 70 | Ht 66.0 in | Wt 167.0 lb

## 2022-03-22 DIAGNOSIS — I10 Essential (primary) hypertension: Secondary | ICD-10-CM | POA: Diagnosis not present

## 2022-03-22 DIAGNOSIS — R0789 Other chest pain: Secondary | ICD-10-CM

## 2022-03-22 DIAGNOSIS — I6523 Occlusion and stenosis of bilateral carotid arteries: Secondary | ICD-10-CM | POA: Diagnosis not present

## 2022-03-22 DIAGNOSIS — R0609 Other forms of dyspnea: Secondary | ICD-10-CM | POA: Diagnosis not present

## 2022-03-22 DIAGNOSIS — E785 Hyperlipidemia, unspecified: Secondary | ICD-10-CM | POA: Diagnosis not present

## 2022-03-22 MED ORDER — CARVEDILOL 3.125 MG PO TABS: 3.1250 mg | ORAL_TABLET | Freq: Two times a day (BID) | ORAL | 1 refills | Status: DC

## 2022-03-22 NOTE — Patient Instructions (Addendum)
Medication Instructions:  START Coreg 3.'125mg'$  Take 1 tablet twice a day RESTART Spironolactone  STOP Metoprolol STOP Triamterene-hydrochlorothiazide *If you need a refill on your cardiac medications before your next appointment, please call your pharmacy*   Lab Work: None Ordered If you have labs (blood work) drawn today and your tests are completely normal, you will receive your results only by: [place] (if you have MyChart) OR A paper copy in the mail If you have any lab test that is abnormal or we need to change your treatment, we will call you to review the results.   Testing/Procedures: Cardiac PET   Follow-Up: At [facility], you and your health needs are our priority.  As part of our continuing mission to provide you with exceptional heart care, we have created designated Provider Care Teams.  These Care Teams include your primary Cardiologist (physician) and Advanced Practice Providers (APPs -  Physician Assistants and Nurse Practitioners) who all work together to provide you with the care you need, when you need it.  We recommend signing up for the patient portal called "MyChart".  Sign up information is provided on this After Visit Summary.  MyChart is used to connect with patients for Virtual Visits (Telemedicine).  Patients are able to view lab/test results, encounter notes, upcoming appointments, etc.  Non-urgent messages can be sent to your provider as well.   To learn more about what you can do with MyChart, go to [email or web address].    Your next appointment:   FOLLOW UP AS SCHEDULED   The format for your next appointment:   In Person  Provider:   Jenkins Rouge, MD     Other Instructions   How to Prepare for Your Cardiac PET Test:  1. Please do not take these medications before your test:   Medications that may interfere with the cardiac pharmacological stress agent (ex. nitrates - including erectile dysfunction medications or  beta-blockers) the day of the exam. (Erectile dysfunction medication should be held for at least 72 hrs prior to test) Theophylline containing medications for 12 hours. Dipyridamole 48 hours prior to the test. Your remaining medications may be taken with water.  2. Nothing to eat or drink, except water, 3 hours prior to arrival time.   NO caffeine/decaffeinated products, or chocolate 12 hours prior to arrival.  3. NO perfume, cologne or lotion  4. Total time is 1 to 2 hours; you may want to bring reading material for the waiting time.  5. Please report to Admitting at the [facility] Main Entrance 60 minutes early for your test.  [place], [place] [address]  Diabetic Preparation:  Hold oral medications. You may take NPH and Lantus insulin. Do not take Humalog or Humulin R (Regular Insulin) the day of your test. Check blood sugars prior to leaving the house. If able to eat breakfast prior to 3 hour fasting, you may take all medications, including your insulin, Do not worry if you miss your breakfast dose of insulin - start at your next meal.  IF YOU THINK YOU MAY BE PREGNANT, OR ARE NURSING PLEASE INFORM THE TECHNOLOGIST.  In preparation for your appointment, medication and supplies will be purchased.  Appointment availability is limited, so if you need to cancel or reschedule, please call the Radiology Department at [phone]  24 hours in advance to avoid a cancellation fee of $100.00  What to Expect After you Arrive:  Once you arrive and check in for your appointment,  you will be taken to a preparation room within the Radiology Department.  A technologist or Nurse will obtain your medical history, verify that you are correctly prepped for the exam, and explain the procedure.  Afterwards,  an IV will be started in your arm and electrodes will be placed on your skin for EKG monitoring during the stress portion of the exam. Then you will be escorted to the  PET/CT scanner.  There, staff will get you positioned on the scanner and obtain a blood pressure and EKG.  During the exam, you will continue to be connected to the EKG and blood pressure machines.  A small, safe amount of a radioactive tracer will be injected in your IV to obtain a series of pictures of your heart along with an injection of a stress agent.    After your Exam:  It is recommended that you eat a meal and drink a caffeinated beverage to counter act any effects of the stress agent.  Drink plenty of fluids for the remainder of the day and urinate frequently for the first couple of hours after the exam.  Your doctor will inform you of your test results within 7-10 business days.  For questions about your test or how to prepare for your test, please call: Marchia Bond, Cardiac Imaging Nurse Navigator  Gordy Clement, Cardiac Imaging Nurse Navigator Office: [phone]   Important Information About Sugar

## 2022-03-23 ENCOUNTER — Other Ambulatory Visit: Payer: Medicare Other

## 2022-03-23 DIAGNOSIS — E876 Hypokalemia: Secondary | ICD-10-CM | POA: Diagnosis not present

## 2022-03-23 DIAGNOSIS — R252 Cramp and spasm: Secondary | ICD-10-CM

## 2022-03-23 LAB — COMPLETE METABOLIC PANEL WITH GFR
AG Ratio: 1.6 (calc) (ref 1.0–2.5)
ALT: 12 U/L (ref 6–29)
AST: 15 U/L (ref 10–35)
Albumin: 4.4 g/dL (ref 3.6–5.1)
Alkaline phosphatase (APISO): 67 U/L (ref 37–153)
BUN: 11 mg/dL (ref 7–25)
CO2: 30 mmol/L (ref 20–32)
Calcium: 9.6 mg/dL (ref 8.6–10.4)
Chloride: 106 mmol/L (ref 98–110)
Creat: 0.93 mg/dL (ref 0.50–1.05)
Globulin: 2.7 g/dL (calc) (ref 1.9–3.7)
Glucose, Bld: 98 mg/dL (ref 65–99)
Potassium: 4.1 mmol/L (ref 3.5–5.3)
Sodium: 142 mmol/L (ref 135–146)
Total Bilirubin: 0.6 mg/dL (ref 0.2–1.2)
Total Protein: 7.1 g/dL (ref 6.1–8.1)
eGFR: 67 mL/min/{1.73_m2} (ref >=60)

## 2022-03-23 NOTE — Telephone Encounter (Signed)
Pt saw Ambrose Pancoast, NP at Cardiologist office and BP medication was adjusted. I advised pt to not increase Aldactone 25 mg until I spoke with Dr. Dennard Schaumann. Pt verbalized understanding of all.    In order to address the patient's elevated BP: A current anti-hypertensive medication was adjusted today.; A new medication was prescribed today.; Blood pressure will be monitored at home to determine if medication changes need to be made.      -She will resume her spironolactone 25 mg today and will discontinue Maxide -She will stop Toprol 100 mg daily and will start Coreg 3.125 mg twice daily

## 2022-03-25 ENCOUNTER — Other Ambulatory Visit: Payer: Self-pay | Admitting: Physician Assistant

## 2022-03-25 ENCOUNTER — Encounter (HOSPITAL_COMMUNITY): Payer: Self-pay

## 2022-03-25 DIAGNOSIS — R002 Palpitations: Secondary | ICD-10-CM | POA: Insufficient documentation

## 2022-03-25 DIAGNOSIS — Z91040 Latex allergy status: Secondary | ICD-10-CM | POA: Diagnosis not present

## 2022-03-25 DIAGNOSIS — I700 Atherosclerosis of aorta: Secondary | ICD-10-CM | POA: Diagnosis not present

## 2022-03-25 DIAGNOSIS — Z79899 Other long term (current) drug therapy: Secondary | ICD-10-CM | POA: Insufficient documentation

## 2022-03-25 DIAGNOSIS — I160 Hypertensive urgency: Secondary | ICD-10-CM | POA: Diagnosis not present

## 2022-03-25 DIAGNOSIS — I10 Essential (primary) hypertension: Secondary | ICD-10-CM | POA: Diagnosis not present

## 2022-03-25 DIAGNOSIS — Z7982 Long term (current) use of aspirin: Secondary | ICD-10-CM | POA: Insufficient documentation

## 2022-03-26 ENCOUNTER — Emergency Department (HOSPITAL_COMMUNITY)
Admission: EM | Admit: 2022-03-26 | Discharge: 2022-03-26 | Disposition: A | Discharge status: Home / Self Care | Payer: Medicare Other | Attending: Emergency Medicine | Admitting: Emergency Medicine

## 2022-03-26 ENCOUNTER — Encounter (HOSPITAL_COMMUNITY): Payer: Self-pay | Admitting: Emergency Medicine

## 2022-03-26 ENCOUNTER — Other Ambulatory Visit: Payer: Self-pay

## 2022-03-26 ENCOUNTER — Emergency Department (HOSPITAL_COMMUNITY): Payer: Medicare Other

## 2022-03-26 DIAGNOSIS — R002 Palpitations: Secondary | ICD-10-CM

## 2022-03-26 DIAGNOSIS — I700 Atherosclerosis of aorta: Secondary | ICD-10-CM | POA: Diagnosis not present

## 2022-03-26 DIAGNOSIS — I160 Hypertensive urgency: Secondary | ICD-10-CM

## 2022-03-26 LAB — CBC
HCT: 37.7 % (ref 36.0–46.0)
Hemoglobin: 12.5 g/dL (ref 12.0–15.0)
MCH: 29.7 pg (ref 26.0–34.0)
MCHC: 33.2 g/dL (ref 30.0–36.0)
MCV: 89.5 fL (ref 80.0–100.0)
Platelets: 286 10*3/uL (ref 150–400)
RBC: 4.21 MIL/uL (ref 3.87–5.11)
RDW: 12.8 % (ref 11.5–15.5)
WBC: 7.9 10*3/uL (ref 4.0–10.5)
nRBC: 0 % (ref 0.0–0.2)

## 2022-03-26 LAB — BASIC METABOLIC PANEL
Anion gap: 9 (ref 5–15)
BUN: 12 mg/dL (ref 8–23)
CO2: 24 mmol/L (ref 22–32)
Calcium: 9.8 mg/dL (ref 8.9–10.3)
Chloride: 107 mmol/L (ref 98–111)
Creatinine, Ser: 0.82 mg/dL (ref 0.44–1.00)
GFR, Estimated: >60 mL/min (ref >60)
Glucose, Bld: 117 mg/dL — ABNORMAL HIGH (ref 70–99)
Potassium: 3.6 mmol/L (ref 3.5–5.1)
Sodium: 140 mmol/L (ref 135–145)

## 2022-03-26 LAB — TROPONIN I (HIGH SENSITIVITY)
Troponin I (High Sensitivity): 12 ng/L (ref <18)
Troponin I (High Sensitivity): 23 ng/L — ABNORMAL HIGH (ref <18)

## 2022-03-26 NOTE — ED Triage Notes (Signed)
Pt arrives POV c/o hypertension and palpitations for the past 2 days. Pt also c/o headache for the past 2 days.

## 2022-03-26 NOTE — Discharge Instructions (Signed)
If your blood pressures are running in the 180 range, take 50 mg of your metoprolol and see if this helps.  Deep a record of your blood pressures over the next several days, then follow-up with your primary doctor to go over the results.

## 2022-03-26 NOTE — ED Provider Notes (Signed)
[facility] EMERGENCY DEPARTMENT Provider Note   CSN: [id number] Arrival date & time: 03/25/22  2339     History  Chief Complaint  Patient presents with   Hypertension    [name] is a 66 y.o. female.  Patient is a 66 year old female with past medical history of hypertension, hyperlipidemia, aortic aneurysm.  Patient presenting today with complaints of palpitations, elevated blood pressure, and headache occurring intermittently for the past 2 days.  Her blood pressure at home has been in the 180s.  She reports a recent change in her blood pressure medications from metoprolol to Coreg.  This was done about 1 week ago by her cardiologist.  She denies to me she is having any chest pains or difficulty breathing.  There are no aggravating or alleviating factors.  The history is provided by the patient.       Home Medications Prior to Admission medications   Medication Sig Start Date End Date Taking? Authorizing Provider  acetaminophen (TYLENOL) 500 MG tablet Take 500 mg by mouth every 6 (six) hours as needed for moderate pain or headache.    [provider]  ALPRAZolam Duanne Moron) 0.5 MG tablet TAKE 1 TABLET(0.5 MG) BY MOUTH TWICE DAILY AS NEEDED FOR ANXIETY 03/04/22   Susy Frizzle, MD  amLODipine (NORVASC) 10 MG tablet TAKE 1 TABLET(10 MG) BY MOUTH AT BEDTIME 09/23/21   Susy Frizzle, MD  aspirin EC 81 MG tablet Take 81 mg by mouth at bedtime.     [provider]  Azelastine-Fluticasone (DYMISTA) 137-50 MCG/ACT SUSP Place 2 sprays into the nose daily. 12/10/21   Susy Frizzle, MD  Calcium 500 MG tablet Take 500 mg by mouth in the morning and at bedtime.    [provider]  Carboxymethylcellul-Glycerin (CLEAR EYES FOR DRY EYES) 1-0.25 % SOLN Place 1 drop into both eyes daily as needed (dry eyes).    [provider]  carvedilol (COREG) 3.125 MG tablet TAKE 1 TABLET(3.125 MG) BY MOUTH TWICE DAILY 03/23/22   Marylu Lund., NP   cholecalciferol (VITAMIN D3) 25 MCG (1000 UT) tablet Take 2,000 Units by mouth daily.    [provider]  cloNIDine (CATAPRES) 0.1 MG tablet Take 0.1 mg by mouth See admin instructions. Take 1/2 tablet daily as needed for blood pressure    [provider]  Coenzyme Q10 (COQ10) 100 MG CAPS Take 100 mg by mouth daily.    [provider]  CRANBERRY PO Take 1 each by mouth 2 (two) times a week. Chewables    [provider]  Cyanocobalamin (B-12) 2500 MCG TABS Take 2,500 mcg by mouth daily.    [provider]  EPINEPHrine 0.3 mg/0.3 mL IJ SOAJ injection Inject 0.3 mg into the muscle as needed for anaphylaxis. 03/12/22   Susy Frizzle, MD  furosemide (LASIX) 20 MG tablet Take 1 tablet (20 mg total) by mouth daily as needed (Weight Gain 3lbs in 24 hrs 5lbs in 1 week). 01/22/22 04/22/22  Marylu Lund., NP  hydroquinone 4 % cream Apply 1 application topically 2 (two) times daily as needed (dark spots). 01/05/21   [provider]  hydrOXYzine (VISTARIL) 25 MG capsule Take 1 capsule (25 mg total) by mouth every 8 (eight) hours as needed. 10/29/21   Susy Frizzle, MD  ipratropium (ATROVENT) 0.03 % nasal spray INSTILL 2 SPRAYS IN EACH NOSTRIL EVERY 12 HOURS 04/17/21   Susy Frizzle, MD  linaclotide Rolan Lipa) [place]  capsule Take 1 capsule (290 mcg total) by mouth daily before breakfast. 03/16/21   Mahala Menghini, PA-C  loratadine (CLARITIN) 10 MG tablet Take 10 mg by mouth daily as needed for allergies.    [provider]  MAGNESIUM PO Take 330 mg by mouth daily.    [provider]  Omega-3 Fatty Acids (FISH OIL) 1000 MG CAPS Take 1,000 mg by mouth 3 (three) times a week.    [provider]  ondansetron (ZOFRAN) 4 MG tablet TAKE 1 TABLET(4 MG) BY MOUTH EVERY 8 HOURS AS NEEDED FOR NAUSEA OR VOMITING 03/06/21   Mahala Menghini, PA-C  pantoprazole (PROTONIX) 40 MG tablet Take '40mg'$  30 minutes before breakfast and can  take additional dose before evening meal if needed. 09/11/21   Mahala Menghini, PA-C  rosuvastatin (CRESTOR) 10 MG tablet Take 1 tablet (10 mg total) by mouth daily. 01/06/22   Susy Frizzle, MD  sodium chloride (OCEAN) 0.65 % SOLN nasal spray Place 1 spray into both nostrils as needed for congestion.    [provider]  spironolactone (ALDACTONE) 25 MG tablet Take 1 tablet (25 mg total) by mouth daily. Patient not taking: Reported on 03/22/2022 03/12/22   Susy Frizzle, MD  topiramate (TOPAMAX) 25 MG tablet Take 1 tablet (25 mg total) by mouth at bedtime. 01/14/22   Rondel Jumbo, PA-C  triamcinolone (NASACORT) 55 MCG/ACT AERO nasal inhaler Place 1 spray into the nose daily as needed (allergies).    [provider]  triamcinolone cream (KENALOG) 0.1 % Apply 1 Application topically 2 (two) times daily. 10/29/21   Susy Frizzle, MD  valsartan (DIOVAN) 320 MG tablet TAKE 1 TABLET BY MOUTH EVERY DAY 01/18/22   Susy Frizzle, MD  vitamin E 400 UNIT capsule Take 400 Units by mouth 3 (three) times a week.    [provider]  zinc gluconate 50 MG tablet Take 50 mg by mouth daily.    [provider]      Allergies    Ciprofloxacin, Latex, and Vitamin c [bioflavonoid products]    Review of Systems   Review of Systems  All other systems reviewed and are negative.   Physical Exam Updated Vital Signs BP (!) 153/64   Pulse 68   Temp 98 F (36.7 C) (Oral)   Resp 18   Ht '5\' 6"'$  (1.676 m)   Wt 76.2 kg   SpO2 99%   BMI 27.12 kg/m  Physical Exam Vitals and nursing note reviewed.  Constitutional:      General: She is not in acute distress.    Appearance: She is well-developed. She is not diaphoretic.  HENT:     Head: Normocephalic and atraumatic.  Cardiovascular:     Rate and Rhythm: Normal rate and regular rhythm.     Heart sounds: No murmur heard.    No friction rub. No gallop.  Pulmonary:     Effort: Pulmonary effort is normal. No respiratory  distress.     Breath sounds: Normal breath sounds. No wheezing.  Abdominal:     General: Bowel sounds are normal. There is no distension.     Palpations: Abdomen is soft.     Tenderness: There is no abdominal tenderness.  Musculoskeletal:        General: Normal range of motion.     Cervical back: Normal range of motion and neck supple.  Skin:    General: Skin is warm and dry.  Neurological:  General: No focal deficit present.     Mental Status: She is alert and oriented to person, place, and time.     ED Results / Procedures / Treatments   Labs (all labs ordered are listed, but only abnormal results are displayed) Labs Reviewed  BASIC METABOLIC PANEL - Abnormal; Notable for the following components:      Result Value   Glucose, Bld 117 (*)    All other components within normal limits  TROPONIN I (HIGH SENSITIVITY) - Abnormal; Notable for the following components:   Troponin I (High Sensitivity) 23 (*)    All other components within normal limits  CBC  TROPONIN I (HIGH SENSITIVITY)    EKG EKG Interpretation  Date/Time:  Friday March 26 2022 00:14:42 EST Ventricular Rate:  85 PR Interval:  160 QRS Duration: 82 QT Interval:  374 QTC Calculation: 445 R Axis:   56 Text Interpretation: Normal sinus rhythm Cannot rule out Anterior infarct , age undetermined Abnormal ECG Confirmed by Veryl Speak [id number]) on 03/26/2022 12:19:34 AM  Radiology DG Chest 2 View  Result Date: 03/26/2022 CLINICAL DATA:  Palpitation. EXAM: CHEST - 2 VIEW COMPARISON:  Chest radiograph dated 02/03/2022. FINDINGS: No focal consolidation, pleural effusion, pneumothorax. The cardiac silhouette is within limits. Atherosclerotic calcification of the aorta. No acute osseous pathology. IMPRESSION: No active cardiopulmonary disease. Electronically Signed   By: Anner Crete M.D.   On: 03/26/2022 00:56    Procedures Procedures    Medications Ordered in ED Medications - No data to display  ED  Course/ Medical Decision Making/ A&P  Patient presenting with palpitations, headache, and elevated blood pressure as described in the HPI.  She arrives here with stable vital signs, no hypoxia, and no fever.  Physical examination is unremarkable.  Work-up initiated including CBC, basic metabolic panel, and troponin x2.  CBC and metabolic panel unremarkable.  Initial troponin negative, but second troponin is 23.  As this is a trivial increase, I doubt an acute coronary syndrome.  She did have elevated blood pressure upon presentation and I feel as though that was the cause.  At this point, I feel as though patient can safely be discharged.  I will advise her to take an extra dose of her metoprolol if she experiences additional palpitations and hypertension.  Final Clinical Impression(s) / ED Diagnoses Final diagnoses:  None    Rx / DC Orders ED Discharge Orders     None         Veryl Speak, MD 03/26/22 [phone]

## 2022-03-30 DIAGNOSIS — L258 Unspecified contact dermatitis due to other agents: Secondary | ICD-10-CM | POA: Diagnosis not present

## 2022-04-07 ENCOUNTER — Telehealth: Payer: Self-pay | Admitting: Family Medicine

## 2022-04-07 NOTE — Telephone Encounter (Signed)
Left message for patient to call back and schedule Medicare Annual Wellness Visit (AWV) in office.   If not able to come in office, please offer to do virtually or by telephone.   Last AWV:02/05/2021   Please schedule at anytime with [facility]  If any questions, please contact me at [phone].  Thank you ,  Colletta [place]

## 2022-04-09 ENCOUNTER — Telehealth: Payer: Self-pay | Admitting: Pharmacist

## 2022-04-09 NOTE — Progress Notes (Signed)
Chronic Care Management Pharmacy Assistant   Name: [name]  MRN: [id number] DOB: 1955-07-23   Reason for Encounter: Hypertension Adherence Call   Recent office visits:  03/12/2022 OV (PCP) Susy Frizzle, MD; Of asked the patient to stop doxazosin. I want her to stop potassium supplement. Start spironolactone 25 mg daily and recheck a BMP and blood pressure in 1 week.   Recent consult visits:  03/22/2022 OV (Cardiology) Marylu Lund., NP; -She will resume her spironolactone 25 mg today and will discontinue Maxide -She will stop Toprol 100 mg daily and will start Coreg 3.125 mg twice daily  Hospital visits:  03/26/2022 ED visit for Hypertensive urgency I will advise her to take an extra dose of her metoprolol if she experiences additional palpitations and hypertension   Medications: Outpatient Encounter Medications as of 04/09/2022  Medication Sig   acetaminophen (TYLENOL) 500 MG tablet Take 500 mg by mouth every 6 (six) hours as needed for moderate pain or headache.   ALPRAZolam (XANAX) 0.5 MG tablet TAKE 1 TABLET(0.5 MG) BY MOUTH TWICE DAILY AS NEEDED FOR ANXIETY   amLODipine (NORVASC) 10 MG tablet TAKE 1 TABLET(10 MG) BY MOUTH AT BEDTIME   aspirin EC 81 MG tablet Take 81 mg by mouth at bedtime.    Azelastine-Fluticasone (DYMISTA) 137-50 MCG/ACT SUSP Place 2 sprays into the nose daily.   Calcium 500 MG tablet Take 500 mg by mouth in the morning and at bedtime.   Carboxymethylcellul-Glycerin (CLEAR EYES FOR DRY EYES) 1-0.25 % SOLN Place 1 drop into both eyes daily as needed (dry eyes).   carvedilol (COREG) 3.125 MG tablet TAKE 1 TABLET(3.125 MG) BY MOUTH TWICE DAILY   cholecalciferol (VITAMIN D3) 25 MCG (1000 UT) tablet Take 2,000 Units by mouth daily.   cloNIDine (CATAPRES) 0.1 MG tablet Take 0.1 mg by mouth See admin instructions. Take 1/2 tablet daily as needed for blood pressure   Coenzyme Q10 (COQ10) 100 MG CAPS Take 100 mg by mouth daily.   CRANBERRY PO  Take 1 each by mouth 2 (two) times a week. Chewables   Cyanocobalamin (B-12) 2500 MCG TABS Take 2,500 mcg by mouth daily.   EPINEPHrine 0.3 mg/0.3 mL IJ SOAJ injection Inject 0.3 mg into the muscle as needed for anaphylaxis.   furosemide (LASIX) 20 MG tablet Take 1 tablet (20 mg total) by mouth daily as needed (Weight Gain 3lbs in 24 hrs 5lbs in 1 week).   hydroquinone 4 % cream Apply 1 application topically 2 (two) times daily as needed (dark spots).   hydrOXYzine (VISTARIL) 25 MG capsule Take 1 capsule (25 mg total) by mouth every 8 (eight) hours as needed.   ipratropium (ATROVENT) 0.03 % nasal spray INSTILL 2 SPRAYS IN EACH NOSTRIL EVERY 12 HOURS   linaclotide (LINZESS) 290 MCG CAPS capsule Take 1 capsule (290 mcg total) by mouth daily before breakfast.   loratadine (CLARITIN) 10 MG tablet Take 10 mg by mouth daily as needed for allergies.   MAGNESIUM PO Take 330 mg by mouth daily.   Omega-3 Fatty Acids (FISH OIL) 1000 MG CAPS Take 1,000 mg by mouth 3 (three) times a week.   ondansetron (ZOFRAN) 4 MG tablet TAKE 1 TABLET(4 MG) BY MOUTH EVERY 8 HOURS AS NEEDED FOR NAUSEA OR VOMITING   pantoprazole (PROTONIX) 40 MG tablet Take '40mg'$  30 minutes before breakfast and can take additional dose before evening meal if needed.   rosuvastatin (CRESTOR) 10 MG tablet Take 1 tablet (10 mg total)  by mouth daily.   sodium chloride (OCEAN) 0.65 % SOLN nasal spray Place 1 spray into both nostrils as needed for congestion.   spironolactone (ALDACTONE) 25 MG tablet Take 1 tablet (25 mg total) by mouth daily. (Patient not taking: Reported on 03/22/2022)   topiramate (TOPAMAX) 25 MG tablet Take 1 tablet (25 mg total) by mouth at bedtime.   triamcinolone (NASACORT) 55 MCG/ACT AERO nasal inhaler Place 1 spray into the nose daily as needed (allergies).   triamcinolone cream (KENALOG) 0.1 % Apply 1 Application topically 2 (two) times daily.   valsartan (DIOVAN) 320 MG tablet TAKE 1 TABLET BY MOUTH EVERY DAY   vitamin E  400 UNIT capsule Take 400 Units by mouth 3 (three) times a week.   zinc gluconate 50 MG tablet Take 50 mg by mouth daily.   No facility-administered encounter medications on file as of 04/09/2022.   Reviewed chart prior to disease state call. Spoke with patient regarding BP  Recent Office Vitals: BP Readings from Last 3 Encounters:  03/26/22 (!) 153/64  03/22/22 (!) 172/84  03/12/22 (!) 158/82   Pulse Readings from Last 3 Encounters:  03/26/22 68  03/22/22 70  03/12/22 61    Wt Readings from Last 3 Encounters:  03/26/22 168 lb (76.2 kg)  03/22/22 167 lb (75.8 kg)  03/12/22 166 lb (75.3 kg)     Kidney Function Lab Results  Component Value Date/Time   CREATININE 0.82 03/26/2022 12:24 AM   CREATININE 0.93 03/23/2022 09:18 AM   CREATININE 0.86 02/03/2022 12:29 AM   CREATININE 0.84 02/01/2022 09:01 AM   GFRNONAA >60 03/26/2022 12:24 AM   GFRNONAA 64 06/10/2020 03:41 PM   GFRAA 74 06/10/2020 03:41 PM       Latest Ref Rng & Units 03/26/2022   12:24 AM 03/23/2022    9:18 AM 02/03/2022   12:29 AM  BMP  Glucose 70 - 99 mg/dL 117  98  103   BUN 8 - 23 mg/dL '12  11  11   '$ Creatinine 0.44 - 1.00 mg/dL 0.82  0.93  0.86   BUN/Creat Ratio 6 - 22 (calc)  SEE NOTE:    Sodium 135 - 145 mmol/L 140  142  141   Potassium 3.5 - 5.1 mmol/L 3.6  4.1  3.7   Chloride 98 - 111 mmol/L 107  106  104   CO2 22 - 32 mmol/L '24  30  27   '$ Calcium 8.9 - 10.3 mg/dL 9.8  9.6  10.1     Current antihypertensive regimen:  Amlodipine 10 mg daily Carvedilol 3.125 mg twice daily Clonidine 0.1 mg 1/2 tablet daily Furosemide 20 mg as needed for weight gain Spironolactone 25 mg daily Valsartan 320 mg daily  How often are you checking your Blood Pressure? daily  Current home BP readings: 122/68  What recent interventions/DTPs have been made by any provider to improve Blood Pressure control since last CPP Visit: Patient discontinued Doxazosin, Maxide and Toprol. She started taking Spirinolactone and  Coreg  Any recent hospitalizations or ED visits since last visit with CPP? No  What diet changes have been made to improve Blood Pressure Control?  Doesn't have much of an appetite.  What exercise is being done to improve your Blood Pressure Control?  Hasn't been exercising much lately.  Adherence Review: Is the patient currently on ACE/ARB medication? Yes Does the patient have >5 day gap between last estimated fill dates? No  -Patient states her swelling is improving with Furosemide.  -  Patient states her urine is dark in color and she is having some back and abdominal pain. She wants to know if any of her medications could be causing these symptoms.  Care Gaps: Medicare Annual Wellness: Overdue since 02/05/2022 Ophthalmology Exam: Overdue since 12/11/2020 Foot Exam: Overdue since 12/10/2018 Hemoglobin A1C: 6.1% on 11/05/2021 Colonoscopy: Completed 04/15/2021 Dexa Scan: Completed 03/05/2021 Mammogram: Completed 01/25/2022  Future Appointments  Date Time Provider [place]  04/27/2022 12:40 PM [facility] PET CT 1 [facility] [place]  06/10/2022 12:00 PM [facility] PHARMACIST [facility]  07/01/2022 10:15 AM Josue Hector, MD [facility] [place] [facility]  07/19/2022  9:30 AM Rondel Jumbo, PA-C [facility] None   Star Rating Drugs: Rosuvastatin 5 mg last filled 03/09/2022 90 DS Valsartan 320 mg last filled 01/18/2022 90 DS  April D Calhoun, [place] Pharmacist Assistant [phone]'

## 2022-04-09 NOTE — Chronic Care Management (AMB) (Signed)
I called and spoke with the patient, she is aware and verbally understands. Patient states she is going to try to schedule an appt with her PCP on Monday 04/12/2022.  April D Calhoun, [place] Pharmacist Assistant [phone]

## 2022-04-09 NOTE — Chronic Care Management (AMB) (Signed)
Patient could possibly have UTI if she has discolored urine paired with back pain.  Especially if she has symptoms when she urinates.  I would advise getting an appointment at PCP or even urgent care ASAP.  An untreated UTI can be problematic.  [name], PharmD, CPP Clinical Pharmacist Practitioner [place] [phone]

## 2022-04-13 ENCOUNTER — Encounter: Payer: Self-pay | Admitting: Family Medicine

## 2022-04-13 ENCOUNTER — Ambulatory Visit (INDEPENDENT_AMBULATORY_CARE_PROVIDER_SITE_OTHER): Payer: Medicare Other | Admitting: Family Medicine

## 2022-04-13 VITALS — BP 156/78 | HR 68 | Ht 66.0 in | Wt 164.0 lb

## 2022-04-13 DIAGNOSIS — R1030 Lower abdominal pain, unspecified: Secondary | ICD-10-CM

## 2022-04-13 LAB — URINALYSIS, ROUTINE W REFLEX MICROSCOPIC
Bacteria, UA: NONE SEEN /HPF
Bilirubin Urine: NEGATIVE
Glucose, UA: NEGATIVE
Hyaline Cast: NONE SEEN /LPF
Ketones, ur: NEGATIVE
Leukocytes,Ua: NEGATIVE
Nitrite: NEGATIVE
Protein, ur: NEGATIVE
Specific Gravity, Urine: 1.01 (ref 1.001–1.035)
WBC, UA: NONE SEEN /HPF (ref 0–5)
pH: 7 (ref 5.0–8.0)

## 2022-04-13 LAB — MICROSCOPIC MESSAGE

## 2022-04-13 NOTE — Progress Notes (Signed)
Subjective:    Patient ID: [name], female    DOB: [birth year], 66 y.o.   MRN: [id number]  Wt Readings from Last 3 Encounters:  04/13/22 164 lb (74.4 kg)  03/26/22 168 lb (76.2 kg)  03/22/22 167 lb (75.8 kg)   Patient reports several days of lower abdominal pain.  The pain is located bandlike fashion above her pelvis.  It radiates into her back.  The pain comes and goes and lays.  It is a dull pain.  She also has some nausea and decreased appetite but no vomiting.  She denies any melena or hematochezia.  She does report constipation.  Is been more than 2 or 3 days since she had.  Patient has been she denies any diarrhea or hematuria.  Urinalysis is significant only for trace amount of blood.  She denies any vaginal bleeding or vaginal pain Past Medical History:  Diagnosis Date   Anxiety    Cataract    Chronic left shoulder pain    Headache    Hiatal hernia    small   Hyperlipidemia    Hypertension    Pre-diabetes    Schatzki's ring    Seasonal allergies    Tremor      Past Surgical History:  Procedure Laterality Date   CARDIAC CATHETERIZATION  2005   "[name] has essentially normal coronary arteries and normal left ventricular function.  She will be treated empirically with anti-reflux measures"   CATARACT EXTRACTION, BILATERAL     CHOLECYSTECTOMY     COLONOSCOPY    06/16/2004   JQB:HALPFX rectum/Diminutive polyps of the splenic flexure and the cecum/The remainder of the colonic mucosa appeared normal pathology (hyperplastic polyps).   COLONOSCOPY N/A 08/07/2015   Dr. Gala Williams: 6 mm descending colon tubular adenoma, multiple medium-mouthed diverticula in sigmoid colon. surveillance 2022   COLONOSCOPY N/A 04/15/2021   Procedure: COLONOSCOPY;  Surgeon: Kimberly Dolin, MD;  [facility];  Service: Endoscopy;  Laterality: N/A;  9:30am   ESOPHAGOGASTRODUODENOSCOPY  09/11/2001   TKW:IOXBDZHGD ring otherwise normal esophagus/Normal stomach/Normal D1 and D2  status post passage of a 56 French Maloney dilator   ESOPHAGOGASTRODUODENOSCOPY N/A 11/11/2017   Procedure: ESOPHAGOGASTRODUODENOSCOPY (EGD);  Surgeon: Kimberly Dolin, MD; mild Schatzki ring s/p dilation and mild erosive gastropathy.   ESOPHAGOGASTRODUODENOSCOPY (EGD) WITH ESOPHAGEAL DILATION N/A 01/25/2013   Dr. Gala Williams- schatzki's ring- 61 F dilation. small hiatal hernia   HYSTEROSCOPY WITH D & C N/A 03/06/2019   Procedure: DILATATION AND CURETTAGE /HYSTEROSCOPY;  Surgeon: Kimberly Kind, MD;  Location: [facility];  Service: Gynecology;  Laterality: N/A;   MALONEY DILATION N/A 11/11/2017   Procedure: [name] DILATION;  Surgeon: Kimberly Dolin, MD;  [facility];  Service: Endoscopy;  Laterality: N/A;   POLYPECTOMY N/A 03/06/2019   Procedure: POLYPECTOMY ( REMOVAL OF ENDOMETRIAL POLYP);  Surgeon: Kimberly Kind, MD;  Location: [facility];  Service: Gynecology;  Laterality: N/A;   POLYPECTOMY  04/15/2021   Procedure: POLYPECTOMY;  Surgeon: Kimberly Dolin, MD;  [facility];  Service: Endoscopy;;   TUBAL LIGATION         Allergies  Allergen Reactions   Ciprofloxacin Shortness Of Breath   Latex Other (See Comments)    Burns skin   Vitamin C [Bioflavonoid Products] Itching   Social History   Socioeconomic History   Marital status: Single    Spouse name: Not on file   Number of children: 3   Years of education:  Not on file   Highest education level: Not on file  Occupational History   Occupation: English as a second language teacher: [employer]  Tobacco Use   Smoking status: Former    Packs/day: 0.25    Years: 35.00    Total pack years: 8.75    Types: Cigarettes    Quit date: 12/31/2012    Years since quitting: 9.2   Smokeless tobacco: Never  Vaping Use   Vaping Use: Never used  Substance and Sexual Activity   Alcohol use: Not Currently   Drug use: No   Sexual activity: Not Currently    Partners: Male    Birth control/protection: Post-menopausal, Surgical     Comment: tubal  Other Topics Concern   Not on file  Social History Narrative   Eats all food groups.    Wears seatbelt.    Has 3 children.   Prior smoker.   Attends church.    Used to work in [employer].    Drives.   Enjoys going out with family.    Right handed   One story home   Social Determinants of Health   Financial Resource Strain: Low Risk  (12/31/2021)   Overall Financial Resource Strain (CARDIA)    Difficulty of Paying Living Expenses: Not very hard  Food Insecurity: Not on file  Transportation Needs: Not on file  Physical Activity: Not on file  Stress: Not on file  Social Connections: Not on file  Intimate Partner Violence: Not on file   Family History  Problem Relation Age of Onset   Diabetes Other    Diabetes Mother    Heart disease Mother    Heart disease Father    Diabetes Sister    Diabetes Brother    Hypertension Brother    Diabetes Daughter    Hypertension Maternal Grandmother    Stroke Maternal Grandfather    Early death Paternal Grandfather    Colon cancer Neg Hx    Liver disease Neg Hx       Review of Systems  All other systems reviewed and are negative.      Objective:   Physical Exam Vitals reviewed.  Constitutional:      Appearance: She is well-developed.  HENT:     Right Ear: Tympanic membrane and ear canal normal.     Left Ear: Tympanic membrane and ear canal normal.     Nose:     Right Sinus: No maxillary sinus tenderness or frontal sinus tenderness.     Left Sinus: No maxillary sinus tenderness or frontal sinus tenderness.  Eyes:     General: No visual field deficit.    Extraocular Movements:     Right eye: Normal extraocular motion and no nystagmus.     Left eye: Normal extraocular motion and no nystagmus.     Conjunctiva/sclera: Conjunctivae normal.     Pupils:     Right eye: Pupil is round and reactive.     Left eye: Pupil is round and reactive.  Cardiovascular:     Rate and Rhythm: Normal rate and regular rhythm.      Heart sounds: Normal heart sounds. No murmur heard.    No friction rub. No gallop.  Pulmonary:     Effort: Pulmonary effort is normal. No respiratory distress.     Breath sounds: Normal breath sounds. No stridor. No wheezing or rales.  Abdominal:     General: Abdomen is flat. Bowel sounds are normal. There is no distension.  Palpations: Abdomen is soft.     Tenderness: There is no abdominal tenderness. There is no guarding.  Musculoskeletal:     Right hand: Normal. No swelling, deformity, tenderness or bony tenderness. Normal range of motion.     Left hand: Normal. No swelling, deformity, tenderness or bony tenderness. Normal range of motion.     Cervical back: Normal range of motion. No rigidity.  Neurological:     Mental Status: She is alert and oriented to person, place, and time. Mental status is at baseline.     Cranial Nerves: No cranial nerve deficit, dysarthria or facial asymmetry.     Sensory: No sensory deficit.     Motor: Tremor present. No weakness or abnormal muscle tone.     Coordination: Romberg sign negative. Coordination normal.     Gait: Gait normal.   Her abdomen is soft nondistended and nontender with no guarding and no rebound.        Assessment & Plan:  Lower abdominal pain - Plan: Urinalysis, Routine w reflex microscopic I believe her lower abdominal pain could be intestinal spasms due to constipation.  I recommended using MiraLAX and Senokot to achieve a bowel movement.  If she cannot tolerate MiraLAX she can try milk of magnesia.  If the pain is not improving after a bowel movement I would recommend imaging of the abdomen and pelvis.  Patient is concerned about weight loss.  Her partner has been taken out last month and 6 pounds since last year.  She states that at times she has a poor appetite.  I recommended that we work this up additionally if the pain does not improve

## 2022-04-23 ENCOUNTER — Telehealth (HOSPITAL_COMMUNITY): Payer: Self-pay | Admitting: *Deleted

## 2022-04-23 ENCOUNTER — Encounter: Payer: Self-pay | Admitting: Cardiovascular Disease

## 2022-04-23 NOTE — Telephone Encounter (Signed)
Reaching out to patient to offer assistance regarding upcoming cardiac imaging study; pt verbalizes understanding of appt date/time, parking situation and where to check in, pre-test NPO status, and verified current allergies; name and call back number provided for further questions should they arise  Brittnae Aschenbrenner RN Navigator Cardiac Imaging [facility] [phone] office [phone] cell  Patient aware to avoid caffeine 12 hours prior to her cardiac PET scan. 

## 2022-04-26 ENCOUNTER — Other Ambulatory Visit: Payer: Self-pay | Admitting: Family Medicine

## 2022-04-26 NOTE — Telephone Encounter (Signed)
PT NEED CPE APPT W/PCP FOR FUTURE REFILLS  

## 2022-04-27 ENCOUNTER — Encounter (HOSPITAL_COMMUNITY)
Admission: RE | Admit: 2022-04-27 | Discharge: 2022-04-27 | Disposition: A | Discharge status: Home / Self Care | Payer: Medicare Other | Source: Ambulatory Visit | Attending: Nurse Practitioner | Admitting: Nurse Practitioner

## 2022-04-27 DIAGNOSIS — I10 Essential (primary) hypertension: Secondary | ICD-10-CM | POA: Insufficient documentation

## 2022-04-27 DIAGNOSIS — R0789 Other chest pain: Secondary | ICD-10-CM | POA: Insufficient documentation

## 2022-04-27 DIAGNOSIS — R0609 Other forms of dyspnea: Secondary | ICD-10-CM | POA: Diagnosis not present

## 2022-04-27 MED ORDER — REGADENOSON 0.4 MG/5ML IV SOLN
INTRAVENOUS | Status: AC
  Administered 2022-04-27: 0.4 mg via INTRAVENOUS
  Filled 2022-04-27: qty 5

## 2022-04-27 MED ORDER — REGADENOSON 0.4 MG/5ML IV SOLN: 0.4000 mg | Freq: Once | INTRAVENOUS | Status: AC

## 2022-04-27 MED ORDER — RUBIDIUM RB82 GENERATOR (RUBYFILL)
19.4900 | PACK | Freq: Once | INTRAVENOUS | Status: AC
  Administered 2022-04-27: 19.49 via INTRAVENOUS

## 2022-04-27 MED ORDER — RUBIDIUM RB82 GENERATOR (RUBYFILL)
19.4100 | PACK | Freq: Once | INTRAVENOUS | Status: AC
  Administered 2022-04-27: 19.41 via INTRAVENOUS

## 2022-04-28 LAB — NM PET CT CARDIAC PERFUSION MULTI W/ABSOLUTE BLOODFLOW
LV dias vol: 79 mL (ref 46–106)
LV sys vol: 35 mL
MBFR: 2.01
Nuc Rest EF: 56 %
Nuc Stress EF: 66 %
Peak HR: 93 {beats}/min
Rest HR: 71 {beats}/min
Rest MBF: 0.94 ml/g/min
Rest Nuclear Isotope Dose: 19.4 mCi
ST Depression (mm): 0 mm
Stress MBF: 1.89 ml/g/min
Stress Nuclear Isotope Dose: 19.5 mCi
TID: 1

## 2022-04-29 ENCOUNTER — Telehealth: Payer: Self-pay

## 2022-04-29 NOTE — Telephone Encounter (Signed)
Spoke with patient to review PET scan results along with recommendations from Ambrose Pancoast, NP. Patient verbalized understanding had no questions.

## 2022-05-01 ENCOUNTER — Encounter (HOSPITAL_COMMUNITY): Payer: Self-pay

## 2022-05-07 NOTE — Telephone Encounter (Signed)
Left message to return call 

## 2022-05-13 ENCOUNTER — Telehealth: Payer: Self-pay

## 2022-05-13 NOTE — Telephone Encounter (Signed)
Pt called stated that since she has been on the Carvedilol her bp has been going up and down but more so after her ED visit 03/27/23 noticed her bp has been going up/down anywhere between 668'D -594 systolic when she takes   BP readings per pt 12/29 152/122 per pt took 1/2 clonidine 05/10/22  181/84 '@1120am'$  pt took 1/2 clonidine  154/71 '@12'$ :20pm  133/67 '@345pm'$  05/11/22 140/71 @ 8am   05/12/22 140/71 '@8am'$   135/77 evening  05/13/22 132/70 '@745'$   Per pt checked at 4:26pm due to a pulsation feeling in the front of her neck and a nausea feeling to her stomach. Per pt she usually feels this way whenever her B/P goes up.   Pls advice

## 2022-05-14 ENCOUNTER — Telehealth: Payer: Self-pay | Admitting: Cardiovascular Disease

## 2022-05-14 ENCOUNTER — Other Ambulatory Visit: Payer: Self-pay | Admitting: Nurse Practitioner

## 2022-05-14 NOTE — Telephone Encounter (Signed)
Pt c/o BP issue: STAT if pt c/o blurred vision, one-sided weakness or slurred speech  1. What are your last 5 BP readings?  162/71; 181/84; 154/71; 133/67; 150/70; 206/98  2. Are you having any other symptoms (ex. Dizziness, headache, blurred vision, passed out)? Palpitation in the neck  3. What is your BP issue? Continue to increase

## 2022-05-14 NOTE — Telephone Encounter (Signed)
Reviewed recommendations with patient who verbalized understanding.  "Please advise her to continue to monitor and also verify that she is using an arm cuff and not a wrist cuff. I would also advise her to check b/p two hours after medications and to make sure that she is seated with legs uncrossed and arm resting at heart level. Please let me know if you have any further questions. "

## 2022-05-14 NOTE — Telephone Encounter (Signed)
Spoke to patient and clarified that BP readings she provided in initial call are from these dates:  05/07/22: 162/71 05/10/22:  181/84, 154/71, 133/67 05/12/22: 150/70 05/13/22 @ 4:30 PM: 206/98. Patient took half tab of PRN clonidine at that time and at 8 pm BP was 169/79.   This morning patient reports she has taken her morning meds (valsartan and spironolactone) and her BP is 112/60. She denies palpitations, blurry vision or headache at this time. I noted that patient had called into her PCP office 05/13/22 to report BP's. I asked if PCP was managing her BP meds and she stated he was.    She reports her normal schedule for BP meds is this: Morning: Valsartan and Spironolactone 1 pm: Carvedilol Evening: Amlodipine and Carvedilol   Patient states her concern is that the clonidine makes her sleepy and does not last long. She is wondering if there are any other options for managing her blood pressure.

## 2022-05-14 NOTE — Telephone Encounter (Signed)
Tried to call pt, no answer. LVM. Re: bp's

## 2022-05-14 NOTE — Telephone Encounter (Signed)
Spoke w/pt regarding bp and pcp's recommendations. Pt voiced understanding.

## 2022-05-18 ENCOUNTER — Ambulatory Visit: Payer: Medicare Other | Admitting: Internal Medicine

## 2022-05-18 ENCOUNTER — Encounter: Payer: Self-pay | Admitting: Internal Medicine

## 2022-05-18 VITALS — BP 153/71 | HR 71 | Temp 98.1°F | Ht 66.0 in | Wt 164.2 lb

## 2022-05-18 DIAGNOSIS — K5900 Constipation, unspecified: Secondary | ICD-10-CM | POA: Diagnosis not present

## 2022-05-18 DIAGNOSIS — K219 Gastro-esophageal reflux disease without esophagitis: Secondary | ICD-10-CM

## 2022-05-18 DIAGNOSIS — R110 Nausea: Secondary | ICD-10-CM

## 2022-05-18 DIAGNOSIS — Z86010 Personal history of colonic polyps: Secondary | ICD-10-CM | POA: Diagnosis not present

## 2022-05-18 MED ORDER — DEXLANSOPRAZOLE 60 MG PO CPDR: 60.0000 mg | DELAYED_RELEASE_CAPSULE | Freq: Every day | ORAL | 11 refills | Status: DC

## 2022-05-18 MED ORDER — TRULANCE 3 MG PO TABS: 3.0000 mg | ORAL_TABLET | Freq: Every day | ORAL | 11 refills | Status: DC

## 2022-05-18 NOTE — Patient Instructions (Addendum)
It was good to see you again today!  As discussed, we will stop pantoprazole and Linzess for now  New prescription for Dexilant 60 mg tablet take 130 minutes before breakfast daily (dispense 30 with 11 refills)  Begin Trulance 3 mg once daily for constipation (dispense 30 with 11 refills)  As discussed, if your weight loss continues and your symptoms have not improved, further evaluation may be warranted  To expedite your evaluation call in and 6 weeks and let us know how you are doing in terms of constipation, acid reflux and appetite  Further management recommendations will be based largely on how you are doing when you provide [place] a progress report in 6 weeks.

## 2022-05-18 NOTE — Progress Notes (Unsigned)
Primary Care Physician:  Susy Frizzle, MD Primary Gastroenterologist:  Dr.   Pre-Procedure History & Physical: HPI:  [name] is a 67 y.o. female here for Follow-up of GERD, anorexia, nausea, early satiety.    States Linzess 290 no longer works producing productive bowel movements may have a bowel movement every other day which she states is not satisfactory.  PCP tried MiraLAX.  She did not like the liquid form of laxative.   Has frequent episodes of anorexia lack of appetite.  States she gets 4 times after eating only a small amount of food.  Does not have dysphagia since she underwent dilation of a Schatzki's ring.  Breakthrough reflux symptoms on pantoprazole 40 mg twice daily.  Uses antiacids on a regular basis.   She was last seen here in November 2022 at which time she weighed 173 pounds  multiple simple colonic adenomas removed 2022; due for surveillance colonoscopy 2025.    Takes Zofran for occasional nausea.  States she sleeps fairly well.  Does not feel like she is under an undue amount of stress.  TSH was normal last assay 2020.   Coronary calcium scoring look good recently.  Has not had any recent cross-sectional imaging of her abdomen. Past Medical History:  Diagnosis Date   Anxiety    Cataract    Chronic left shoulder pain    Headache    Hiatal hernia    small   Hyperlipidemia    Hypertension    Pre-diabetes    Schatzki's ring    Seasonal allergies    Tremor     Past Surgical History:  Procedure Laterality Date   CARDIAC CATHETERIZATION  2005   "Ms. Doane has essentially normal coronary arteries and normal left ventricular function.  She will be treated empirically with anti-reflux measures"   CATARACT EXTRACTION, BILATERAL     CHOLECYSTECTOMY     COLONOSCOPY    06/16/2004   LHT:DSKAJG rectum/Diminutive polyps of the splenic flexure and the cecum/The remainder of the colonic mucosa appeared normal pathology (hyperplastic polyps).   COLONOSCOPY  N/A 08/07/2015   Dr. Gala Romney: 6 mm descending colon tubular adenoma, multiple medium-mouthed diverticula in sigmoid colon. surveillance 2022   COLONOSCOPY N/A 04/15/2021   Procedure: COLONOSCOPY;  Surgeon: Daneil Dolin, MD;  [facility];  Service: Endoscopy;  Laterality: N/A;  9:30am   ESOPHAGOGASTRODUODENOSCOPY  09/11/2001   OTL:XBWIOMBTD ring otherwise normal esophagus/Normal stomach/Normal D1 and D2 status post passage of a 56 French Maloney dilator   ESOPHAGOGASTRODUODENOSCOPY N/A 11/11/2017   Procedure: ESOPHAGOGASTRODUODENOSCOPY (EGD);  Surgeon: Daneil Dolin, MD; mild Schatzki ring s/p dilation and mild erosive gastropathy.   ESOPHAGOGASTRODUODENOSCOPY (EGD) WITH ESOPHAGEAL DILATION N/A 01/25/2013   Dr. Gala Romney- schatzki's ring- 62 F dilation. small hiatal hernia   HYSTEROSCOPY WITH D & C N/A 03/06/2019   Procedure: DILATATION AND CURETTAGE /HYSTEROSCOPY;  Surgeon: Jonnie Kind, MD;  Location: [facility];  Service: Gynecology;  Laterality: N/A;   MALONEY DILATION N/A 11/11/2017   Procedure: [name] DILATION;  Surgeon: Daneil Dolin, MD;  [facility];  Service: Endoscopy;  Laterality: N/A;   POLYPECTOMY N/A 03/06/2019   Procedure: POLYPECTOMY ( REMOVAL OF ENDOMETRIAL POLYP);  Surgeon: Jonnie Kind, MD;  Location: [facility];  Service: Gynecology;  Laterality: N/A;   POLYPECTOMY  04/15/2021   Procedure: POLYPECTOMY;  Surgeon: Daneil Dolin, MD;  [facility];  Service: Endoscopy;;   TUBAL LIGATION  Prior to Admission medications   Medication Sig Start Date End Date Taking? Authorizing Provider  acetaminophen (TYLENOL) 500 MG tablet Take 500 mg by mouth every 6 (six) hours as needed for moderate pain or headache.   Yes [provider]  ALPRAZolam (XANAX) 0.5 MG tablet TAKE 1 TABLET(0.5 MG) BY MOUTH TWICE DAILY AS NEEDED FOR ANXIETY 03/04/22  Yes Susy Frizzle, MD  amLODipine (NORVASC) 10 MG tablet TAKE 1 TABLET(10 MG) BY MOUTH AT BEDTIME  09/23/21  Yes Susy Frizzle, MD  aspirin EC 81 MG tablet Take 81 mg by mouth at bedtime.    Yes [provider]  Azelastine-Fluticasone (DYMISTA) 137-50 MCG/ACT SUSP Place 2 sprays into the nose daily. 12/10/21  Yes Susy Frizzle, MD  Calcium 500 MG tablet Take 500 mg by mouth in the morning and at bedtime.   Yes [provider]  carvedilol (COREG) 3.125 MG tablet TAKE 1 TABLET(3.125 MG) BY MOUTH TWICE DAILY 05/14/22  Yes Marylu Lund., NP  cholecalciferol (VITAMIN D3) 25 MCG (1000 UT) tablet Take 2,000 Units by mouth daily.   Yes [provider]  cloNIDine (CATAPRES) 0.1 MG tablet Take 0.1 mg by mouth See admin instructions. Take 1/2 tablet daily as needed for blood pressure   Yes [provider]  Coenzyme Q10 (COQ10) 100 MG CAPS Take 100 mg by mouth daily.   Yes [provider]  CRANBERRY PO Take 1 each by mouth 2 (two) times a week. Chewables   Yes [provider]  Cyanocobalamin (B-12) 2500 MCG TABS Take 2,500 mcg by mouth daily.   Yes [provider]  EPINEPHrine 0.3 mg/0.3 mL IJ SOAJ injection Inject 0.3 mg into the muscle as needed for anaphylaxis. 03/12/22  Yes Susy Frizzle, MD  furosemide (LASIX) 20 MG tablet Take 1 tablet (20 mg total) by mouth daily as needed (Weight Gain 3lbs in 24 hrs 5lbs in 1 week). 01/22/22 05/18/22 Yes Marylu Lund., NP  hydroquinone 4 % cream Apply 1 application topically 2 (two) times daily as needed (dark spots). 01/05/21  Yes [provider]  hydrOXYzine (VISTARIL) 25 MG capsule Take 1 capsule (25 mg total) by mouth every 8 (eight) hours as needed. 10/29/21  Yes Susy Frizzle, MD  ipratropium (ATROVENT) 0.03 % nasal spray INSTILL 2 SPRAYS IN EACH NOSTRIL EVERY 12 HOURS 04/17/21  Yes Susy Frizzle, MD  linaclotide [facility]) 290 MCG CAPS capsule Take 1 capsule (290 mcg total) by mouth daily before breakfast. 03/16/21  Yes Mahala Menghini, PA-C  loratadine (CLARITIN) 10 MG  tablet Take 10 mg by mouth daily as needed for allergies.   Yes [provider]  MAGNESIUM PO Take 330 mg by mouth daily.   Yes [provider]  Omega-3 Fatty Acids (FISH OIL) 1000 MG CAPS Take 1,000 mg by mouth 3 (three) times a week.   Yes [provider]  ondansetron (ZOFRAN) 4 MG tablet TAKE 1 TABLET(4 MG) BY MOUTH EVERY 8 HOURS AS NEEDED FOR NAUSEA OR VOMITING 03/06/21  Yes Mahala Menghini, PA-C  pantoprazole (PROTONIX) 40 MG tablet Take '40mg'$  30 minutes before breakfast and can take additional dose before evening meal if needed. 09/11/21  Yes Mahala Menghini, PA-C  rosuvastatin (CRESTOR) 10 MG tablet Take 1 tablet (10 mg total) by mouth daily. 01/06/22  Yes Susy Frizzle, MD  sodium chloride (OCEAN) 0.65 % SOLN nasal spray Place 1 spray into both nostrils as needed for congestion.  Yes [provider]  spironolactone (ALDACTONE) 25 MG tablet Take 1 tablet (25 mg total) by mouth daily. 03/12/22  Yes Susy Frizzle, MD  topiramate (TOPAMAX) 25 MG tablet Take 1 tablet (25 mg total) by mouth at bedtime. 01/14/22  Yes Rondel Jumbo, PA-C  triamcinolone cream (KENALOG) 0.1 % Apply 1 Application topically 2 (two) times daily. 10/29/21  Yes Susy Frizzle, MD  valsartan (DIOVAN) 320 MG tablet TAKE 1 TABLET BY MOUTH EVERY DAY 04/26/22  Yes Susy Frizzle, MD  vitamin E 400 UNIT capsule Take 400 Units by mouth 3 (three) times a week.   Yes [provider]  zinc gluconate 50 MG tablet Take 50 mg by mouth daily.   Yes [provider]    Allergies as of 05/18/2022 - Review Complete 05/18/2022  Allergen Reaction Noted   Ciprofloxacin Shortness Of Breath 12/27/2011   Latex Other (See Comments) 10/17/2019   Vitamin c [bioflavonoid products] Itching 04/09/2021    Family History  Problem Relation Age of Onset   Diabetes Other    Diabetes Mother    Heart disease Mother    Heart disease Father    Diabetes Sister    Diabetes Brother     Hypertension Brother    Diabetes Daughter    Hypertension Maternal Grandmother    Stroke Maternal Grandfather    Early death Paternal Grandfather    Colon cancer Neg Hx    Liver disease Neg Hx     Social History   Socioeconomic History   Marital status: Single    Spouse name: Not on file   Number of children: 3   Years of education: Not on file   Highest education level: Not on file  Occupational History   Occupation: English as a second language teacher: [employer]  Tobacco Use   Smoking status: Former    Packs/day: 0.25    Years: 35.00    Total pack years: 8.75    Types: Cigarettes    Quit date: 12/31/2012    Years since quitting: 9.3   Smokeless tobacco: Never  Vaping Use   Vaping Use: Never used  Substance and Sexual Activity   Alcohol use: Not Currently   Drug use: No   Sexual activity: Not Currently    Partners: Male    Birth control/protection: Post-menopausal, Surgical    Comment: tubal  Other Topics Concern   Not on file  Social History Narrative   Eats all food groups.    Wears seatbelt.    Has 3 children.   Prior smoker.   Attends church.    Used to work in [employer] fund[employer].    Drives.   Enjoys going out with family.    Right handed   One story home   Social Determinants of Health   Financial Resource Strain: Low Risk  (12/31/2021)   Overall Financial Resource Strain (CARDIA)    Difficulty of Paying Living Expenses: Not very hard  Food Insecurity: Not on file  Transportation Needs: Not on file  Physical Activity: Not on file  Stress: Not on file  Social Connections: Not on file  Intimate Partner Violence: Not on file    Review of Systems: See HPI, otherwise negative ROS  Physical Exam: BP (!) 149/74 (BP Location: Right Arm, Patient Position: Sitting, Cuff Size: Large)   Pulse 76   Temp 98.1 F (36.7 C) (Oral)   Ht '5\' 6"'$  (1.676 m)   Wt 164 lb 3.2 oz (74.5 kg)  SpO2 98%   BMI 26.50 kg/m  General:   Alert,  Well-developed, well-nourished,  pleasant and cooperative in NAD SNeck:  Supple; no masses or thyromegaly. No significant cervical adenopathy. Lungs:  Clear throughout to auscultation.   No wheezes, crackles, or rhonchi. No acute distress. Heart:  Regular rate and rhythm; no murmurs, clicks, rubs,  or gallops. Abdomen: Non-distended, normal bowel sounds.  Soft and nontender without appreciable mass or hepatosplenomegaly.  Pulses:  Normal pulses noted. Extremities:  Without clubbing or edema.  Impression/Plan:    67 year old lady with reported poorly controlled GERD and constipation in spite of being on a substantial acid suppression regimen as wellas pharmacotherapy for constipation.  Not mentioned above her gallbladder is out.  Breakthrough reflux symptoms on twice daily pantoprazole.  No dysphagia.  Early satiety and some anorexia.  Really not having any abdominal pain.  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  It is notable she has lost 11 pounds by our scales in the past 13 months.    I take note of that weight loss in the context of her ongoing symptoms.  Blood pressure elevated today.  It will be rechecked.       Recommendations:  As discussed, we will stop pantoprazole and Linzess for now  New prescription for Dexilant 60 mg tablet take 130 minutes before breakfast daily (dispense 30 with 11 refills)  Begin Trulance 3 mg once daily for constipation (dispense 30 with 11 refills)  As discussed, if weight loss continues and symptoms have not improved, further evaluation may be warranted  To expedite  evaluation patient to call in 6 weeks and let us know how she is doing in terms of constipation, acid reflux and appetite  Further management recommendations will be based largely on how she is doing when she reports in 6 weeks.            Notice: This dictation was prepared with Dragon dictation along with smaller phrase technology. Any transcriptional errors that  result from this process are unintentional and may not be corrected upon review.

## 2022-05-20 ENCOUNTER — Other Ambulatory Visit: Payer: Self-pay | Admitting: Family Medicine

## 2022-05-20 NOTE — Telephone Encounter (Signed)
Requested Prescriptions  Pending Prescriptions Disp Refills   valsartan (DIOVAN) 320 MG tablet [Pharmacy Med Name: VALSARTAN 320 MG TABLET] 90 tablet 0    Sig: TAKE 1 TABLET BY MOUTH EVERY DAY     Cardiovascular:  Angiotensin Receptor Blockers Failed - 05/20/2022  1:34 PM      Failed - Last BP in normal range    BP Readings from Last 1 Encounters:  05/18/22 (!) 153/71         Failed - Valid encounter within last 6 months    Recent Outpatient Visits           7 months ago Unilateral headache   [place] Susy Frizzle, MD   8 months ago Lumbar radiculopathy, chronic   [place] Dennard Schaumann, Cammie Mcgee, MD   10 months ago Viral upper respiratory tract infection   [place] Dennard Schaumann Cammie Mcgee, MD   1 year ago Abnormal urine color   [place] Dennard Schaumann, Cammie Mcgee, MD   1 year ago Chest wall pain   [place] Pickard, Cammie Mcgee, MD       Future Appointments             In 3 weeks Pickard, Cammie Mcgee, MD [place]   In 1 month Johnsie Cancel, Wallis Bamberg, MD [place] [facility] [place]. [facility] [place] [facility]            Passed - Cr in normal range and within 180 days    Creat  Date Value Ref Range Status  03/23/2022 0.93 0.50 - 1.05 mg/dL Final   Creatinine, Ser  Date Value Ref Range Status  03/26/2022 0.82 0.44 - 1.00 mg/dL Final   Creatinine, Urine  Date Value Ref Range Status  01/20/2021 47 20 - 275 mg/dL Final         Passed - K in normal range and within 180 days    Potassium  Date Value Ref Range Status  03/26/2022 3.6 3.5 - 5.1 mmol/L Final  11/16/2012 4.1 mmol/L Final         Passed - Patient is not pregnant

## 2022-05-31 NOTE — Progress Notes (Signed)
Care Management & Coordination Services Pharmacy Note  06/10/2022 Name:  [name] MRN:  [id number] DOB:  11-09-1955  Summary: PharmD FU visit for BP and lipids.  She has been better controlled with BP lately after ED visit last Nov.  Occasional headaches and one high systolic reading in 353I but other than that most < 140.  Started '10mg'$  Crestor but has not had lipid panel since starting.  She is high risk ASCVD patient.  Recommendations/Changes made from today's visit: Consider '20mg'$  Crestor pending lipids. Due for A1c and UACR  Follow up plan: FU 6 months   Subjective: [name] is an 67 y.o. year old female who is a primary patient of [name].  The care coordination team was consulted for assistance with disease management and care coordination needs.    Engaged with patient by telephone for follow up visit.  Recent office visits:  03/12/2022 OV (PCP) Susy Frizzle, Williams; Of asked the patient to stop doxazosin. I want her to stop potassium supplement. Start spironolactone 25 mg daily and recheck a BMP and blood pressure in 1 week.    Recent consult visits:  03/22/2022 OV (Cardiology) Marylu Lund., NP; -She will resume her spironolactone 25 mg today and will discontinue Maxide -She will stop Toprol 100 mg daily and will start Coreg 3.125 mg twice daily   Hospital visits:  03/26/2022 ED visit for Hypertensive urgency I will advise her to take an extra dose of her metoprolol if she experiences additional palpitations and hypertension   Objective:  Lab Results  Component Value Date   CREATININE 0.82 03/26/2022   BUN 12 03/26/2022   EGFR 67 03/23/2022   GFRNONAA >60 03/26/2022   GFRAA 74 06/10/2020   NA 140 03/26/2022   K 3.6 03/26/2022   CALCIUM 9.8 03/26/2022   CO2 24 03/26/2022   GLUCOSE 117 (H) 03/26/2022    Lab Results  Component Value Date/Time   HGBA1C 6.1 (H) 11/05/2021 08:19 AM   HGBA1C 5.9 (H) 01/20/2021 10:08 AM    MICROALBUR 0.5 01/20/2021 10:08 AM   MICROALBUR 0.2 09/13/2019 02:39 PM    Last diabetic Eye exam:  Lab Results  Component Value Date/Time   HMDIABEYEEXA No Retinopathy 12/12/2019 04:42 PM    Last diabetic Foot exam: No results found for: "HMDIABFOOTEX"   Lab Results  Component Value Date   CHOL 163 01/20/2021   HDL 60 01/20/2021   LDLCALC 89 01/20/2021   TRIG 62 01/20/2021   CHOLHDL 2.7 01/20/2021       Latest Ref Rng & Units 03/23/2022    9:18 AM 02/03/2022   12:29 AM 02/01/2022    9:01 AM  Hepatic Function  Total Protein 6.1 - 8.1 g/dL 7.1  8.4  6.7   Albumin 3.5 - 5.0 g/dL  4.6    AST 10 - 35 U/L '15  22  17   '$ ALT 6 - 29 U/L '12  22  15   '$ Alk Phosphatase 38 - 126 U/L  70    Total Bilirubin 0.2 - 1.2 mg/dL 0.6  1.0  0.5     Lab Results  Component Value Date/Time   TSH 1.84 10/29/2019 11:57 AM   TSH 2.11 06/27/2018 08:48 AM       Latest Ref Rng & Units 03/26/2022   12:24 AM 02/03/2022   12:29 AM 01/19/2022   10:55 PM  CBC  WBC 4.0 - 10.5 K/uL 7.9  7.5  8.2  Hemoglobin 12.0 - 15.0 g/dL 12.5  13.4  12.8   Hematocrit 36.0 - 46.0 % 37.7  40.7  37.5   Platelets 150 - 400 K/uL 286  278  265     Lab Results  Component Value Date/Time   VD25OH 38 01/20/2021 10:08 AM   VITAMINB12 >2,000 (H) 01/20/2021 10:08 AM   VITAMINB12 >2,000 (H) 10/29/2019 11:57 AM    Clinical ASCVD: Yes  The ASCVD Risk score (Arnett DK, et al., 2019) failed to calculate for the following reasons:   The systolic blood pressure is missing        06/01/2022    2:25 PM 04/13/2022    9:10 AM 03/12/2022    1:57 PM  Depression screen PHQ 2/9  Decreased Interest 0 0 0  Down, Depressed, Hopeless 1 2 0  PHQ - 2 Score 1 2 0  Altered sleeping 0 0   Tired, decreased energy 1 2   Change in appetite  3   Feeling bad or failure about yourself  0 0   Trouble concentrating 1 2   Moving slowly or fidgety/restless 0 0   Suicidal thoughts 0 0   PHQ-9 Score 3 9      Social History   Tobacco Use   Smoking Status Former   Packs/day: 0.25   Years: 35.00   Total pack years: 8.75   Types: Cigarettes   Quit date: 12/31/2012   Years since quitting: 9.4  Smokeless Tobacco Never   BP Readings from Last 3 Encounters:  05/18/22 (!) 153/71  04/27/22 (!) 128/44  04/13/22 (!) 156/78   Pulse Readings from Last 3 Encounters:  05/18/22 71  04/13/22 68  03/26/22 68   Wt Readings from Last 3 Encounters:  06/01/22 164 lb (74.4 kg)  05/18/22 164 lb 3.2 oz (74.5 kg)  04/13/22 164 lb (74.4 kg)   BMI Readings from Last 3 Encounters:  06/01/22 26.47 kg/m  05/18/22 26.50 kg/m  04/13/22 26.47 kg/m    Allergies  Allergen Reactions   Ciprofloxacin Shortness Of Breath   Latex Other (See Comments)    Burns skin   Vitamin C [Bioflavonoid Products] Itching    Medications Reviewed Today     Reviewed by Bernita Raisin, LPN (Licensed Practical Nurse) on 06/01/22 at 29  Med List Status: <None>   Medication Order Taking? Sig Documenting Provider Last Dose Status Informant  acetaminophen (TYLENOL) 500 MG tablet [id number] Yes Take 500 mg by mouth every 6 (six) hours as needed for moderate pain or headache. Provider, Historical, Williams Taking Active Self, Pharmacy Records  ALPRAZolam Duanne Moron) 0.5 MG tablet [id number] Yes TAKE 1 TABLET(0.5 MG) BY MOUTH TWICE DAILY AS NEEDED FOR ANXIETY Susy Frizzle, Williams Taking Active   amLODipine (NORVASC) 10 MG tablet [id number] Yes TAKE 1 TABLET(10 MG) BY MOUTH AT BEDTIME Susy Frizzle, Williams Taking Active   aspirin EC 81 MG tablet [id number] Yes Take 81 mg by mouth at bedtime.  Provider, Historical, Williams Taking Active Self, Pharmacy Records  Azelastine-Fluticasone [facility]) 137-50 MCG/ACT SUSP [id number] Yes Place 2 sprays into the nose daily. Susy Frizzle, Williams Taking Active   Calcium 500 MG tablet [id number] Yes Take 500 mg by mouth in the morning and at bedtime. Provider, Historical, Williams Taking Active Self, Pharmacy Records  carvedilol (COREG) 3.125 MG tablet  [id number] Yes TAKE 1 TABLET(3.125 MG) BY MOUTH TWICE DAILY Barbarann Ehlers Junius Creamer., NP Taking Active   cholecalciferol (VITAMIN D3) 25 MCG (1000 UT) tablet [id number] Yes  Take 2,000 Units by mouth daily. Provider, Historical, Williams Taking Active Self, Pharmacy Records  cloNIDine (CATAPRES) 0.1 MG tablet [id number] Yes Take 0.1 mg by mouth See admin instructions. Take 1/2 tablet daily as needed for blood pressure Provider, Historical, Williams Taking Active Self, Pharmacy Records           Med Note ([place] Apr 13, 2022  9:17 AM) Pt takes as needed.   Coenzyme Q10 (COQ10) 100 MG CAPS [id number] Yes Take 100 mg by mouth daily. Provider, Historical, Williams Taking Active Self, Pharmacy Records  CRANBERRY PO [id number] Yes Take 1 each by mouth 2 (two) times a week. Chewables Provider, Historical, Williams Taking Active Self, Pharmacy Records  Cyanocobalamin (B-12) 2500 MCG TABS [id number] Yes Take 2,500 mcg by mouth daily. Provider, Historical, Williams Taking Active Self, Pharmacy Records  dexlansoprazole (DEXILANT) 60 MG capsule [id number] Yes Take 1 capsule (60 mg total) by mouth daily. Daneil Dolin, Williams Taking Active   EPINEPHrine 0.3 mg/0.3 mL IJ SOAJ injection [id number] Yes Inject 0.3 mg into the muscle as needed for anaphylaxis. Susy Frizzle, Williams Taking Active   furosemide (LASIX) 20 MG tablet [id number]  Take 1 tablet (20 mg total) by mouth daily as needed (Weight Gain 3lbs in 24 hrs 5lbs in 1 week). Marylu Lund., NP  Expired 05/18/22 2359            Med Note Langston Masker, MARY JANE K   Tue Apr 13, 2022  9:18 AM) 04/13/22-Pt states she takes as needed.   hydroquinone 4 % cream [id number] Yes Apply 1 application topically 2 (two) times daily as needed (dark spots). Provider, Historical, Williams Taking Active Self, Pharmacy Records  hydrOXYzine (VISTARIL) 25 MG capsule [id number] Yes Take 1 capsule (25 mg total) by mouth every 8 (eight) hours as needed. Susy Frizzle, Williams Taking Active   ipratropium (ATROVENT) 0.03 %  nasal spray [id number] Yes INSTILL 2 SPRAYS IN EACH NOSTRIL EVERY 12 HOURS [name] Taking Active Self, Pharmacy Records  loratadine (CLARITIN) 10 MG tablet [id number] Yes Take 10 mg by mouth daily as needed for allergies. Provider, Historical, Williams Taking Active Self, Pharmacy Records  MAGNESIUM PO [id number] Yes Take 330 mg by mouth daily. Provider, Historical, Williams Taking Active Self, Pharmacy Records  Omega-3 Fatty Acids (FISH OIL) 1000 MG CAPS [id number] Yes Take 1,000 mg by mouth 3 (three) times a week. Provider, Historical, Williams Taking Active Self, Pharmacy Records  ondansetron (ZOFRAN) 4 MG tablet [id number] Yes TAKE 1 TABLET(4 MG) BY MOUTH EVERY 8 HOURS AS NEEDED FOR NAUSEA OR VOMITING Mahala Menghini, PA-C Taking Active Self, Pharmacy Records  Plecanatide [facility]) 3 MG TABS [id number] Yes Take 3 mg by mouth daily. Daneil Dolin, Williams Taking Active   rosuvastatin (CRESTOR) 10 MG tablet [id number] Yes Take 1 tablet (10 mg total) by mouth daily. Susy Frizzle, Williams Taking Active   sodium chloride (OCEAN) 0.65 % SOLN nasal spray [id number] Yes Place 1 spray into both nostrils as needed for congestion. Provider, Historical, Williams Taking Active Self, Pharmacy Records  spironolactone (ALDACTONE) 25 MG tablet [id number] Yes Take 1 tablet (25 mg total) by mouth daily. Susy Frizzle, Williams Taking Active   topiramate (TOPAMAX) 25 MG tablet [id number] Yes Take 1 tablet (25 mg total) by mouth at bedtime. Rondel Jumbo, PA-C Taking Active   triamcinolone cream (KENALOG) 0.1 % [id number] Yes Apply 1 Application topically 2 (two) times daily. Susy Frizzle,  Williams Taking Active   valsartan (DIOVAN) 320 MG tablet [id number] Yes TAKE 1 TABLET BY MOUTH EVERY DAY Susy Frizzle, Williams Taking Active   vitamin E 400 UNIT capsule [id number] Yes Take 400 Units by mouth 3 (three) times a week. Provider, Historical, Williams Taking Active Self, Pharmacy Records  zinc gluconate 50 MG tablet [id number] Yes Take 50 mg by mouth  daily. Provider, Historical, Williams Taking Active Self, Pharmacy Records            SDOH:  (Social Determinants of Health) assessments and interventions performed: No, done within last year Financial Resource Strain: Low Risk  (06/01/2022)   Overall Financial Resource Strain (CARDIA)    Difficulty of Paying Living Expenses: Not hard at all    SDOH Interventions    [place] from 06/01/2022 in [place] Interventions   Food Insecurity Interventions Intervention Not Indicated  Housing Interventions Intervention Not Indicated  Transportation Interventions Intervention Not Indicated  Utilities Interventions Intervention Not Indicated  Alcohol Usage Interventions Intervention Not Indicated (Score <7)  Financial Strain Interventions Intervention Not Indicated  Physical Activity Interventions Intervention Not Indicated  Stress Interventions Intervention Not Indicated  Social Connections Interventions Intervention Not Indicated      SDOH Screenings   Food Insecurity: No Food Insecurity (06/01/2022)  Housing: Low Risk  (06/01/2022)  Transportation Needs: No Transportation Needs (06/01/2022)  Utilities: Not At Risk (06/01/2022)  Alcohol Screen: Low Risk  (06/01/2022)  Depression (PHQ2-9): Low Risk  (06/01/2022)  Recent Concern: Depression (PHQ2-9) - Medium Risk (04/13/2022)  Financial Resource Strain: Low Risk  (06/01/2022)  Physical Activity: Sufficiently Active (06/01/2022)  Social Connections: Moderately Integrated (06/01/2022)  Stress: No Stress Concern Present (06/01/2022)  Tobacco Use: Medium Risk (06/01/2022)    Medication Assistance: None required.  Patient affirms current coverage meets needs.  Medication Access: Within the past 30 days, how often has patient missed a dose of medication? 0 Is a pillbox or other method used to improve adherence? Yes  Factors that may affect medication adherence? no barriers identified Are meds  synced by current pharmacy? No  Are meds delivered by current pharmacy? No  Does patient experience delays in picking up medications due to transportation concerns? No   [facility] Services Reviewed: Is patient disadvantaged to use [facility]?: No  Current Rx insurance plan: [facility] Name and location of Current pharmacy:  [facility], [address] [place] [address] Phone: [phone] Fax: [phone]  [id number] [place] [id number]: [phone]: [phone] [facility] services reviewed with patient today?: No  Patient requests to transfer care to [facility]?: No  Reason patient declined to change pharmacies:  Previous [facility] who switched  Compliance/Adherence/Medication fill history: Care Gaps: UACR, A1c  Star-Rating Drugs: Rosuvastatin '10mg'$  90ds 04/11/22   Assessment/Plan     Hypertension (BP goal <130/80) 06/10/22 -Query controlled, elevated in office - normal at home (hx of white coat syndrome) -Current treatment: Valsartan '320mg'$  Appropriate, Effective, Safe, Accessible Metoprolol XL '100mg'$  Appropriate, Effective, Safe, Accessible Amlodipine '10mg'$  Appropriate, Query effective, ,  Clonidine 0.'1mg'$  prn Appropriate, Effective, Safe, Accessible Carvedilol 3.'125mg'$  Bid Appropriate, Effective, Safe, Accessible Spironolactone '25mg'$  Appropriate, Effective, Safe, Accessible -Medications previously tried: none noted  -Current home readings: 179/81 on Jan 25th, 123/62, 141/72 -Current dietary  habits: same see previous -Current exercise habits: same see previous -Denies hypotensive/hypertensive symptoms -BP has improved - still having occasional high BP and headaches.  At this time I think she is reasonably controlled - only taking clonidine prn.  Denies any chest pain or  HA.   Hyperlipidemia: (LDL goal < 70) 06/10/2022 -Uncontrolled, last lipid panel LDL elevated -Current treatment: Rosuvastatin '10mg'$  Appropriate, Query effective, ,  -Medications previously tried: none noted  -Patient with carotid artery stenosis, previous symptoms of TIA. -She is now taking the increased dose of Crestor '10mg'$  daily - reports no adverse effects with medication. -Checked fill history and was filled 01/06/22 for 90ds. -Discussed importance of adherence and risk reduction for statins. -Ideally I would like her LDL < 70. -Recently increased to '10mg'$  Crestor - has CPE tomorrow so would recommend updated lipid panel.  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  12/31/21 ASVCD risk 29.4% - would recommend increase in her statin dose. Dose increased to Crestor '10mg'$  daily  Patient Goals/Self-Care Activities Patient will:  - take medications as prescribed as evidenced by patient report and record review check blood pressure a few times per week, document, and provide at future appointments  Follow Up Plan: The care management team will reach out to the patient again over the next 90 days.           [name], PharmD, CPP Clinical Pharmacist Practitioner [place] [phone]

## 2022-06-01 ENCOUNTER — Ambulatory Visit (INDEPENDENT_AMBULATORY_CARE_PROVIDER_SITE_OTHER): Payer: Medicare Other

## 2022-06-01 VITALS — Ht 66.0 in | Wt 164.0 lb

## 2022-06-01 DIAGNOSIS — Z0000 Encounter for general adult medical examination without abnormal findings: Secondary | ICD-10-CM | POA: Diagnosis not present

## 2022-06-01 NOTE — Progress Notes (Signed)
Subjective:   [name] is a 67 y.o. female who presents for Medicare Annual (Subsequent) preventive examination.  I connected with  [name] on 06/01/22 by a audio enabled telemedicine application and verified that I am speaking with the correct person using two identifiers.  Patient Location: Home  Provider Location: Office/Clinic  I discussed the limitations of evaluation and management by telemedicine. The patient expressed understanding and agreed to proceed.  Review of Systems     Cardiac Risk Factors include: advanced age (>57mn, >>69women);diabetes mellitus;hypertension;dyslipidemia     Objective:    Today's Vitals   06/01/22 1359  Weight: 164 lb (74.4 kg)  Height: '5\' 6"'$  (1.676 m)   Body mass index is 26.47 kg/m.     06/01/2022    2:00 PM 03/26/2022   12:09 AM 01/14/2022    9:18 AM 10/12/2021    9:53 AM 10/02/2021    7:53 PM 09/09/2021    3:08 AM 07/08/2021    2:59 PM  Advanced Directives  Does Patient Have a Medical Advance Directive? No No No No No No No  Would patient like information on creating a medical advance directive? No - Patient declined No - Patient declined   No - Patient declined  No - Patient declined    Current Medications (verified) Outpatient Encounter Medications as of 06/01/2022  Medication Sig   acetaminophen (TYLENOL) 500 MG tablet Take 500 mg by mouth every 6 (six) hours as needed for moderate pain or headache.   ALPRAZolam (XANAX) 0.5 MG tablet TAKE 1 TABLET(0.5 MG) BY MOUTH TWICE DAILY AS NEEDED FOR ANXIETY   amLODipine (NORVASC) 10 MG tablet TAKE 1 TABLET(10 MG) BY MOUTH AT BEDTIME   aspirin EC 81 MG tablet Take 81 mg by mouth at bedtime.    Azelastine-Fluticasone (DYMISTA) 137-50 MCG/ACT SUSP Place 2 sprays into the nose daily.   Calcium 500 MG tablet Take 500 mg by mouth in the morning and at bedtime.   carvedilol (COREG) 3.125 MG tablet TAKE 1 TABLET(3.125 MG) BY MOUTH TWICE DAILY   cholecalciferol (VITAMIN D3) 25  MCG (1000 UT) tablet Take 2,000 Units by mouth daily.   cloNIDine (CATAPRES) 0.1 MG tablet Take 0.1 mg by mouth See admin instructions. Take 1/2 tablet daily as needed for blood pressure   Coenzyme Q10 (COQ10) 100 MG CAPS Take 100 mg by mouth daily.   CRANBERRY PO Take 1 each by mouth 2 (two) times a week. Chewables   Cyanocobalamin (B-12) 2500 MCG TABS Take 2,500 mcg by mouth daily.   dexlansoprazole (DEXILANT) 60 MG capsule Take 1 capsule (60 mg total) by mouth daily.   EPINEPHrine 0.3 mg/0.3 mL IJ SOAJ injection Inject 0.3 mg into the muscle as needed for anaphylaxis.   hydroquinone 4 % cream Apply 1 application topically 2 (two) times daily as needed (dark spots).   hydrOXYzine (VISTARIL) 25 MG capsule Take 1 capsule (25 mg total) by mouth every 8 (eight) hours as needed.   ipratropium (ATROVENT) 0.03 % nasal spray INSTILL 2 SPRAYS IN EACH NOSTRIL EVERY 12 HOURS   loratadine (CLARITIN) 10 MG tablet Take 10 mg by mouth daily as needed for allergies.   MAGNESIUM PO Take 330 mg by mouth daily.   Omega-3 Fatty Acids (FISH OIL) 1000 MG CAPS Take 1,000 mg by mouth 3 (three) times a week.   ondansetron (ZOFRAN) 4 MG tablet TAKE 1 TABLET(4 MG) BY MOUTH EVERY 8 HOURS AS NEEDED FOR NAUSEA OR VOMITING   Plecanatide (  TRULANCE) 3 MG TABS Take 3 mg by mouth daily.   rosuvastatin (CRESTOR) 10 MG tablet Take 1 tablet (10 mg total) by mouth daily.   sodium chloride (OCEAN) 0.65 % SOLN nasal spray Place 1 spray into both nostrils as needed for congestion.   spironolactone (ALDACTONE) 25 MG tablet Take 1 tablet (25 mg total) by mouth daily.   topiramate (TOPAMAX) 25 MG tablet Take 1 tablet (25 mg total) by mouth at bedtime.   triamcinolone cream (KENALOG) 0.1 % Apply 1 Application topically 2 (two) times daily.   valsartan (DIOVAN) 320 MG tablet TAKE 1 TABLET BY MOUTH EVERY DAY   vitamin E 400 UNIT capsule Take 400 Units by mouth 3 (three) times a week.   zinc gluconate 50 MG tablet Take 50 mg by mouth daily.    furosemide (LASIX) 20 MG tablet Take 1 tablet (20 mg total) by mouth daily as needed (Weight Gain 3lbs in 24 hrs 5lbs in 1 week).   No facility-administered encounter medications on file as of 06/01/2022.    Allergies (verified) Ciprofloxacin, Latex, and Vitamin c [bioflavonoid products]   History: Past Medical History:  Diagnosis Date   Anxiety    Cataract    Chronic left shoulder pain    Headache    Hiatal hernia    small   Hyperlipidemia    Hypertension    Pre-diabetes    Schatzki's ring    Seasonal allergies    Tremor    Past Surgical History:  Procedure Laterality Date   CARDIAC CATHETERIZATION  2005   "Ms. Hundertmark has essentially normal coronary arteries and normal left ventricular function.  She will be treated empirically with anti-reflux measures"   CATARACT EXTRACTION, BILATERAL     CHOLECYSTECTOMY     COLONOSCOPY    06/16/2004   JIR:CVELFY rectum/Diminutive polyps of the splenic flexure and the cecum/The remainder of the colonic mucosa appeared normal pathology (hyperplastic polyps).   COLONOSCOPY N/A 08/07/2015   Dr. Gala Romney: 6 mm descending colon tubular adenoma, multiple medium-mouthed diverticula in sigmoid colon. surveillance 2022   COLONOSCOPY N/A 04/15/2021   Procedure: COLONOSCOPY;  Surgeon: Daneil Dolin, MD;  Location: [facility];  Service: Endoscopy;  Laterality: N/A;  9:30am   ESOPHAGOGASTRODUODENOSCOPY  09/11/2001   BOF:BPZWCHENI ring otherwise normal esophagus/Normal stomach/Normal D1 and D2 status post passage of a 56 French Maloney dilator   ESOPHAGOGASTRODUODENOSCOPY N/A 11/11/2017   Procedure: ESOPHAGOGASTRODUODENOSCOPY (EGD);  Surgeon: Daneil Dolin, MD; mild Schatzki ring s/p dilation and mild erosive gastropathy.   ESOPHAGOGASTRODUODENOSCOPY (EGD) WITH ESOPHAGEAL DILATION N/A 01/25/2013   Dr. Gala Romney- schatzki's ring- 80 F dilation. small hiatal hernia   HYSTEROSCOPY WITH D & C N/A 03/06/2019   Procedure: DILATATION AND CURETTAGE  /HYSTEROSCOPY;  Surgeon: Jonnie Kind, MD;  Location: [facility];  Service: Gynecology;  Laterality: N/A;   MALONEY DILATION N/A 11/11/2017   Procedure: [name] DILATION;  Surgeon: Daneil Dolin, MD;  Location: [facility];  Service: Endoscopy;  Laterality: N/A;   POLYPECTOMY N/A 03/06/2019   Procedure: POLYPECTOMY ( REMOVAL OF ENDOMETRIAL POLYP);  Surgeon: Jonnie Kind, MD;  Location: [facility];  Service: Gynecology;  Laterality: N/A;   POLYPECTOMY  04/15/2021   Procedure: POLYPECTOMY;  Surgeon: Daneil Dolin, MD;  Location: [facility];  Service: Endoscopy;;   TUBAL LIGATION     Family History  Problem Relation Age of Onset   Diabetes Other    Diabetes Mother    Heart disease Mother  Heart disease Father    Diabetes Sister    Diabetes Brother    Hypertension Brother    Diabetes Daughter    Hypertension Maternal Grandmother    Stroke Maternal Grandfather    Early death Paternal Grandfather    Colon cancer Neg Hx    Liver disease Neg Hx    Social History   Socioeconomic History   Marital status: Single    Spouse name: Not on file   Number of children: 3   Years of education: Not on file   Highest education level: Not on file  Occupational History   Occupation: English as a second language teacher: [employer]  Tobacco Use   Smoking status: Former    Packs/day: 0.25    Years: 35.00    Total pack years: 8.75    Types: Cigarettes    Quit date: 12/31/2012    Years since quitting: 9.4   Smokeless tobacco: Never  Vaping Use   Vaping Use: Never used  Substance and Sexual Activity   Alcohol use: Not Currently   Drug use: No   Sexual activity: Not Currently    Partners: Male    Birth control/protection: Post-menopausal, Surgical    Comment: tubal  Other Topics Concern   Not on file  Social History Narrative   Eats all food groups.    Wears seatbelt.    Has 3 children.   Prior smoker.   Attends church.    Used to work in [employer] fund[employer].    Drives.   Enjoys going out  with family.    Right handed   One story home   Social Determinants of Health   Financial Resource Strain: Low Risk  (06/01/2022)   Overall Financial Resource Strain (CARDIA)    Difficulty of Paying Living Expenses: Not hard at all  Food Insecurity: No Food Insecurity (06/01/2022)   Hunger Vital Sign    Worried About Running Out of Food in the Last Year: Never true    Ran Out of Food in the Last Year: Never true  Transportation Needs: No Transportation Needs (06/01/2022)   PRAPARE - Hydrologist (Medical): No    Lack of Transportation (Non-Medical): No  Physical Activity: Sufficiently Active (06/01/2022)   Exercise Vital Sign    Days of Exercise per Week: 5 days    Minutes of Exercise per Session: 30 min  Stress: No Stress Concern Present (06/01/2022)   [place]    Feeling of Stress : Not at all  Social Connections: Moderately Integrated (06/01/2022)   Social Connection and Isolation Panel [NHANES]    Frequency of Communication with Friends and Family: More than three times a week    Frequency of Social Gatherings with Friends and Family: Three times a week    Attends Religious Services: More than 4 times per year    Active Member of Clubs or Organizations: Yes    Attends Music therapist: More than 4 times per year    Marital Status: Never married    Tobacco Counseling Counseling given: Not Answered   Clinical Intake:  Pre-visit preparation completed: Yes  Pain : No/denies pain     Diabetes: Yes CBG done?: No Did pt. bring in CBG monitor from home?: No  How often do you need to have someone help you when you read instructions, pamphlets, or other written materials from your doctor or pharmacy?: 1 - Never  Diabetic?Yes  Nutrition Risk  Assessment:  Has the patient had any N/V/D within the last 2 months?  No  Does the patient have any non-healing wounds?  No   Has the patient had any unintentional weight loss or weight gain?  No   Diabetes:  Is the patient diabetic?  Yes  If diabetic, was a CBG obtained today?  No  Did the patient bring in their glucometer from home?  No  How often do you monitor your CBG's? As needed.   Financial Strains and Diabetes Management:  Are you having any financial strains with the device, your supplies or your medication? No .  Does the patient want to be seen by Chronic Care Management for management of their diabetes?   Currently receiving services  Would the patient like to be referred to a Nutritionist or for Diabetic Management?  No   Diabetic Exams:  Diabetic Eye Exam: Completed with [facility]; will request records  Diabetic Foot Exam: Completed 04/13/22    Interpreter Needed?: No  Information entered by :: Denman George LPN   Activities of Daily Living    06/01/2022    2:01 PM  In your present state of health, do you have any difficulty performing the following activities:  Hearing? 0  Vision? 0  Difficulty concentrating or making decisions? 0  Walking or climbing stairs? 0  Dressing or bathing? 0  Doing errands, shopping? 0  Preparing Food and eating ? N  Using the Toilet? N  In the past six months, have you accidently leaked urine? N  Do you have problems with loss of bowel control? N  Managing your Medications? N  Managing your Finances? N  Housekeeping or managing your Housekeeping? N    Patient Care Team: Susy Frizzle, MD as PCP - General (Family Medicine) Josue Hector, MD as PCP - Cardiology (Cardiology) Gala Romney Cristopher Estimable, MD as Consulting Physician (Gastroenterology) Edythe Clarity, [facility] as Pharmacist (Pharmacist) [facility] ([facility], [facility] (Ophthalmology)  Indicate any recent Medical Services you may have received from other than [facility] providers in the past year (date may be approximate).     Assessment:    This is a routine wellness examination for [place].  Hearing/Vision screen Hearing Screening - Comments:: Denies hearing difficulties  Vision Screening - Comments:: Wears rx glasses - up to date with routine eye exams with [facility] issues and exercise activities discussed: Current Exercise Habits: Home exercise routine, Type of exercise: walking, Time (Minutes): 30, Frequency (Times/Week): 5, Weekly Exercise (Minutes/Week): 150, Intensity: Mild   Goals Addressed   None   Depression Screen    06/01/2022    2:25 PM 04/13/2022    9:10 AM 03/12/2022    1:57 PM 02/05/2021   10:38 AM 10/28/2020   12:14 PM 10/29/2019   11:36 AM 12/18/2018    4:14 PM  PHQ 2/9 Scores  PHQ - 2 Score 1 2 0 0 0 0 0  PHQ- 9 Score 3 9         Fall Risk    06/01/2022    2:00 PM 04/13/2022    9:09 AM 03/12/2022    1:57 PM 01/14/2022    9:18 AM 10/12/2021    9:54 AM  [place] in the past year? 0 0 0 0 0  Number falls in past yr: 0 0 0 0 0  Injury with Fall? 0 0 0 0 0  Risk for fall  due to :  No Fall Risks No Fall Risks    Follow up Falls prevention discussed;Education provided;Falls evaluation completed Falls prevention discussed Falls prevention discussed      FALL RISK PREVENTION PERTAINING TO THE HOME:  Any stairs in or around the home? No  If so, are there any without handrails? No  Home free of loose throw rugs in walkways, pet beds, electrical cords, etc? Yes  Adequate lighting in your home to reduce risk of falls? Yes   ASSISTIVE DEVICES UTILIZED TO PREVENT FALLS:  Life alert? No  Use of a cane, walker or w/c? No  Grab bars in the bathroom? Yes  Shower chair or bench in shower? No  Elevated toilet seat or a handicapped toilet? Yes   TIMED UP AND GO:  Was the test performed? No . Telephonic visit   Cognitive Function:        06/01/2022    2:01 PM 02/05/2021   10:38 AM  6CIT Screen  What Year? 0 points 0 points  What month? 0 points 0 points  What time? 0  points 0 points  Count back from 20 0 points 0 points  Months in reverse 0 points 0 points  Repeat phrase 0 points 0 points  Total Score 0 points 0 points    Immunizations Immunization History  Administered Date(s) Administered   Fluad Quad(high Dose 65+) 02/14/2020, 02/05/2021   Influenza,inj,Quad PF,6+ Mos 02/05/2017, 02/23/2018, 02/20/2019   Moderna Sars-Covid-2 Vaccination 10/10/2019, 11/08/2019, 05/19/2020   Pneumococcal Conjugate-13 07/26/2017   Pneumococcal Polysaccharide-23 02/14/2020   Tdap 03/28/2017   Zoster Recombinat (Shingrix) 08/24/2021, 11/30/2021    TDAP status: Up to date  Flu Vaccine status: Up to date  Pneumococcal vaccine status: Up to date  Covid-19 vaccine status: Completed vaccines  Qualifies for Shingles Vaccine? Yes   Zostavax completed No   Shingrix Completed?: Yes  Screening Tests Health Maintenance  Topic Date Due   COVID-19 Vaccine (4 - 2023-24 season) 01/08/2022   Diabetic kidney evaluation - Urine ACR  01/20/2022   HEMOGLOBIN A1C  05/07/2022   INFLUENZA VACCINE  08/08/2022 (Originally 12/08/2021)   OPHTHALMOLOGY EXAM  10/22/2022 (Originally 12/11/2020)   DEXA SCAN  04/14/2023 (Originally 02/04/2019)   Diabetic kidney evaluation - eGFR measurement  03/27/2023   FOOT EXAM  04/14/2023   Medicare Annual Wellness (AWV)  06/02/2023   MAMMOGRAM  01/26/2024   DTaP/Tdap/Td (2 - Td or Tdap) 03/29/2027   COLONOSCOPY (Pts 45-63yr Insurance coverage will need to be confirmed)  04/16/2031   Pneumonia Vaccine 67 Years old  Completed   Hepatitis C Screening  Completed   Zoster Vaccines- Shingrix  Completed   HPV VACCINES  Aged Out    Health Maintenance  Health Maintenance Due  Topic Date Due   COVID-19 Vaccine (4 - 2023-24 season) 01/08/2022   Diabetic kidney evaluation - Urine ACR  01/20/2022   HEMOGLOBIN A1C  05/07/2022    Colorectal cancer screening: Type of screening: Colonoscopy. Completed 04/15/21. Repeat every 10 years  Mammogram  status: Completed 01/25/22. Repeat every year  Bone Density status: Patient would like to postpone at this time   Lung Cancer Screening: (Low Dose CT Chest recommended if Age 508-80years, 30 pack-year currently smoking OR have quit w/in 15years.) does not qualify.   Lung Cancer Screening Referral: n/a   Additional Screening:  Hepatitis C Screening: does qualify; Completed 03/14/17  Vision Screening: Recommended annual ophthalmology exams for early detection of glaucoma and other disorders of the eye. Is  the patient up to date with their annual eye exam?  Yes  Who is the provider or what is the name of the office in which the patient attends annual eye exams? [place]  If pt is not established with a provider, would they like to be referred to a provider to establish care? No .   Dental Screening: Recommended annual dental exams for proper oral hygiene  Community Resource Referral / Chronic Care Management: CRR required this visit?  No   CCM required this visit?  No      Plan:     I have personally reviewed and noted the following in the patient's chart:   Medical and social history Use of alcohol, tobacco or illicit drugs  Current medications and supplements including opioid prescriptions. Patient is not currently taking opioid prescriptions. Functional ability and status Nutritional status Physical activity Advanced directives List of other physicians Hospitalizations, surgeries, and ER visits in previous 12 months Vitals Screenings to include cognitive, depression, and falls Referrals and appointments  In addition, I have reviewed and discussed with patient certain preventive protocols, quality metrics, and best practice recommendations. A written personalized care plan for preventive services as well as general preventive health recommendations were provided to patient.     Vanetta Mulders, [place]   8/84/1660   Due to this being a virtual visit, the after  visit summary with patients personalized plan was offered to patient via mail or my-chart.  Patient would like to access on my-chart  Nurse Notes: No concerns

## 2022-06-01 NOTE — Patient Instructions (Addendum)
[name] , Thank you for taking time to come for your Medicare Wellness Visit. I appreciate your ongoing commitment to your health goals. Please review the following plan we discussed and let me know if I can assist you in the future.   These are the goals we discussed:  Goals      CARE PLAN:     CARE PLAN ENTRY  Current Barriers:  Chronic Disease Management support, education, and care coordination needs related to Hypertension, Hyperlipidemia, and Diabetes   Hypertension Pharmacist Clinical Goal(s): Over the next 90 days, patient will work with PharmD and providers to achieve BP goal <140/90 Current regimen:  Valsartan '320mg'$  daily, metoprolol succinate '100mg'$  daily, HCTZ '25mg'$  daily, clonidine 0.'1mg'$  as needed, and amlodipine '10mg'$  daily Interventions: Record blood pressure in a log so that we can go over at future appointments. Restart Lexapro to control anxiety to see if this stops some of the blood pressure spikes. Patient self care activities - Over the next 90 days, patient will: Check BP three times daily, document, and provide at future appointments Ensure daily salt intake < 2300 mg/day Restart Lexapro and contact PharmD or PCP if rash develops or wish to discontinue.  Hyperlipidemia Pharmacist Clinical Goal(s): Over the next 90 days, patient will work with PharmD and providers to achieve LDL goal < 70 Current regimen:  Rosuvastatin '5mg'$  daily Interventions: Continue current therapy. Due for lipid check to evaluate current therapy for modification if needed. Patient self care activities - Over the next 90 days, patient will: Continue to take medication as directed, notify PharmD or PCP with any side effects such as muscle pain. Come in for lipid recheck appointment.  Diabetes Pharmacist Clinical Goal(s): Over the next 90 days, patient will work with PharmD and providers to maintain A1c goal <7% Current regimen:  No medications currently Interventions: Continue to  control with diet/exercise. Patient self care activities - Over the next 90 days, patient will: Check blood sugar as usual, document, and provide at future appointments Contact provider with any episodes of hypoglycemia Continue to walk weekly as you currently are and make smart dietary choices.  Medication management Pharmacist Clinical Goal(s): Over the next 90 days, patient will work with PharmD and providers to maintain optimal medication adherence Current pharmacy: [facility] Interventions Comprehensive medication review performed. Utilize [facility] for medication synchronization, packaging and delivery Patient self care activities - Over the next 90 days, patient will: Focus on medication adherence by pill count and utilize [facility] packaging system once synchronized. Take medications as prescribed Report any questions or concerns to PharmD and/or provider(s)  Initial goal documentation      Track and Manage My Blood Pressure-Hypertension     Timeframe:  Long-Range Goal Priority:  High Start Date:    12/31/21                         Expected End Date:    07/03/22                   Follow Up Date 04/02/22    - check blood pressure daily    Why is this important?   You won't feel high blood pressure, but it can still hurt your blood vessels.  High blood pressure can cause heart or kidney problems. It can also cause a stroke.  Making lifestyle changes like losing a little weight or eating less salt will help.  Checking your blood pressure at home  and at different times of the day can help to control blood pressure.  If the doctor prescribes medicine remember to take it the way the doctor ordered.  Call the office if you cannot afford the medicine or if there are questions about it.     Notes:         This is a list of the screening recommended for you and due dates:  Health Maintenance  Topic Date Due   COVID-19 Vaccine (4 - 2023-24  season) 01/08/2022   Yearly kidney health urinalysis for diabetes  01/20/2022   Hemoglobin A1C  05/07/2022   Flu Shot  08/08/2022*   Eye exam for diabetics  10/22/2022*   DEXA scan (bone density measurement)  04/14/2023*   Yearly kidney function blood test for diabetes  03/27/2023   Complete foot exam   04/14/2023   Medicare Annual Wellness Visit  06/02/2023   Mammogram  01/26/2024   DTaP/Tdap/Td vaccine (2 - Td or Tdap) 03/29/2027   Colon Cancer Screening  04/16/2031   Pneumonia Vaccine  Completed   Hepatitis C Screening: USPSTF Recommendation to screen - Ages 28-79 yo.  Completed   Zoster (Shingles) Vaccine  Completed   HPV Vaccine  Aged Out  *Topic was postponed. The date shown is not the original due date.    Advanced directives: Forms are available if you choose in the future to pursue completion.  This is recommended in order to make sure that your health wishes are honored in the event that you are unable to verbalize them to the provider.    Conditions/risks identified: Aim for 30 minutes of exercise or brisk walking, 6-8 glasses of water, and 5 servings of fruits and vegetables each day.   Next appointment: Follow up in one year for your annual wellness visit    Preventive Care 65 Years and Older, Female Preventive care refers to lifestyle choices and visits with your health care provider that can promote health and wellness. What does preventive care include? A yearly physical exam. This is also called an annual well check. Dental exams once or twice a year. Routine eye exams. Ask your health care provider how often you should have your eyes checked. Personal lifestyle choices, including: Daily care of your teeth and gums. Regular physical activity. Eating a healthy diet. Avoiding tobacco and drug use. Limiting alcohol use. Practicing safe sex. Taking low-dose aspirin every day. Taking vitamin and mineral supplements as recommended by your health care provider. What  happens during an annual well check? The services and screenings done by your health care provider during your annual well check will depend on your age, overall health, lifestyle risk factors, and family history of disease. Counseling  Your health care provider may ask you questions about your: Alcohol use. Tobacco use. Drug use. Emotional well-being. Home and relationship well-being. Sexual activity. Eating habits. History of falls. Memory and ability to understand (cognition). Work and work Statistician. Reproductive health. Screening  You may have the following tests or measurements: Height, weight, and BMI. Blood pressure. Lipid and cholesterol levels. These may be checked every 5 years, or more frequently if you are over 97 years old. Skin check. Lung cancer screening. You may have this screening every year starting at age 61 if you have a 30-pack-year history of smoking and currently smoke or have quit within the past 15 years. Fecal occult blood test (FOBT) of the stool. You may have this test every year starting at age 35. Flexible sigmoidoscopy or  colonoscopy. You may have a sigmoidoscopy every 5 years or a colonoscopy every 10 years starting at age 29. Hepatitis C blood test. Hepatitis B blood test. Sexually transmitted disease (STD) testing. Diabetes screening. This is done by checking your blood sugar (glucose) after you have not eaten for a while (fasting). You may have this done every 1-3 years. Bone density scan. This is done to screen for osteoporosis. You may have this done starting at age 57. Mammogram. This may be done every 1-2 years. Talk to your health care provider about how often you should have regular mammograms. Talk with your health care provider about your test results, treatment options, and if necessary, the need for more tests. Vaccines  Your health care provider may recommend certain vaccines, such as: Influenza vaccine. This is recommended every  year. Tetanus, diphtheria, and acellular pertussis (Tdap, Td) vaccine. You may need a Td booster every 10 years. Zoster vaccine. You may need this after age 67. Pneumococcal 13-valent conjugate (PCV13) vaccine. One dose is recommended after age 11. Pneumococcal polysaccharide (PPSV23) vaccine. One dose is recommended after age 79. Talk to your health care provider about which screenings and vaccines you need and how often you need them. This information is not intended to replace advice given to you by your health care provider. Make sure you discuss any questions you have with your health care provider. Document Released: 05/23/2015 Document Revised: 01/14/2016 Document Reviewed: 02/25/2015 Elsevier Interactive Patient Education  2017 [place] Prevention in the Home Falls can cause injuries. They can happen to people of all ages. There are many things you can do to make your home safe and to help prevent falls. What can I do on the outside of my home? Regularly fix the edges of walkways and driveways and fix any cracks. Remove anything that might make you trip as you walk through a door, such as a raised step or threshold. Trim any bushes or trees on the path to your home. Use bright outdoor lighting. Clear any walking paths of anything that might make someone trip, such as rocks or tools. Regularly check to see if handrails are loose or broken. Make sure that both sides of any steps have handrails. Any raised decks and porches should have guardrails on the edges. Have any leaves, snow, or ice cleared regularly. Use sand or salt on walking paths during winter. Clean up any spills in your garage right away. This includes oil or grease spills. What can I do in the bathroom? Use night lights. Install grab bars by the toilet and in the tub and shower. Do not use towel bars as grab bars. Use non-skid mats or decals in the tub or shower. If you need to sit down in the shower, use a  plastic, non-slip stool. Keep the floor dry. Clean up any water that spills on the floor as soon as it happens. Remove soap buildup in the tub or shower regularly. Attach bath mats securely with double-sided non-slip rug tape. Do not have throw rugs and other things on the floor that can make you trip. What can I do in the bedroom? Use night lights. Make sure that you have a light by your bed that is easy to reach. Do not use any sheets or blankets that are too big for your bed. They should not hang down onto the floor. Have a firm chair that has side arms. You can use this for support while you get dressed. Do not  have throw rugs and other things on the floor that can make you trip. What can I do in the kitchen? Clean up any spills right away. Avoid walking on wet floors. Keep items that you use a lot in easy-to-reach places. If you need to reach something above you, use a strong step stool that has a grab bar. Keep electrical cords out of the way. Do not use floor polish or wax that makes floors slippery. If you must use wax, use non-skid floor wax. Do not have throw rugs and other things on the floor that can make you trip. What can I do with my stairs? Do not leave any items on the stairs. Make sure that there are handrails on both sides of the stairs and use them. Fix handrails that are broken or loose. Make sure that handrails are as long as the stairways. Check any carpeting to make sure that it is firmly attached to the stairs. Fix any carpet that is loose or worn. Avoid having throw rugs at the top or bottom of the stairs. If you do have throw rugs, attach them to the floor with carpet tape. Make sure that you have a light switch at the top of the stairs and the bottom of the stairs. If you do not have them, ask someone to add them for you. What else can I do to help prevent falls? Wear shoes that: Do not have high heels. Have rubber bottoms. Are comfortable and fit you  well. Are closed at the toe. Do not wear sandals. If you use a stepladder: Make sure that it is fully opened. Do not climb a closed stepladder. Make sure that both sides of the stepladder are locked into place. Ask someone to hold it for you, if possible. Clearly mark and make sure that you can see: Any grab bars or handrails. First and last steps. Where the edge of each step is. Use tools that help you move around (mobility aids) if they are needed. These include: Canes. Walkers. Scooters. Crutches. Turn on the lights when you go into a dark area. Replace any light bulbs as soon as they burn out. Set up your furniture so you have a clear path. Avoid moving your furniture around. If any of your floors are uneven, fix them. If there are any pets around you, be aware of where they are. Review your medicines with your doctor. Some medicines can make you feel dizzy. This can increase your chance of falling. Ask your doctor what other things that you can do to help prevent falls. This information is not intended to replace advice given to you by your health care provider. Make sure you discuss any questions you have with your health care provider. Document Released: 02/20/2009 Document Revised: 10/02/2015 Document Reviewed: 05/31/2014 Elsevier Interactive Patient Education  2017 [employer].

## 2022-06-02 DIAGNOSIS — H04123 Dry eye syndrome of bilateral lacrimal glands: Secondary | ICD-10-CM | POA: Diagnosis not present

## 2022-06-10 ENCOUNTER — Encounter: Payer: Medicare Other | Admitting: Pharmacist

## 2022-06-10 ENCOUNTER — Other Ambulatory Visit: Payer: Self-pay | Admitting: Family Medicine

## 2022-06-10 ENCOUNTER — Ambulatory Visit: Payer: Medicare Other | Admitting: Pharmacist

## 2022-06-10 NOTE — Telephone Encounter (Signed)
Unable to refill per protocol, Rx expired. Medication was discontinued 03/22/22, will refuse.  Requested Prescriptions  Pending Prescriptions Disp Refills   triamterene-hydrochlorothiazide (MAXZIDE) 75-50 MG tablet [Pharmacy Med Name: TRIAMTERENE '75MG'$ / HCTZ '50MG'$  TABLETS] 90 tablet 3    Sig: TAKE 1 TABLET BY MOUTH DAILY     Cardiovascular: Diuretic Combos Failed - 06/10/2022 11:38 AM      Failed - Last BP in normal range    BP Readings from Last 1 Encounters:  05/18/22 (!) 153/71         Failed - Valid encounter within last 6 months    Recent Outpatient Visits           8 months ago Unilateral headache   [place] Susy Frizzle, MD   9 months ago Lumbar radiculopathy, chronic   [place] [facility], Cammie Mcgee, MD   11 months ago Viral upper respiratory tract infection   [place] [facility] Schaumann, Cammie Mcgee, MD   1 year ago Abnormal urine color   [place] [facility] Scha[facility], Cammie Mcgee, MD   1 year ago Chest wall pain   [place], Cammie Mcgee, MD       Future Appointments             Tomorrow Susy Frizzle, MD [place], [place]   In 3 weeks Josue Hector, MD [place] [facility], [place] in normal range and within 180 days    Potassium  Date Value Ref Range Status  03/26/2022 3.6 3.5 - 5.1 mmol/L Final  11/16/2012 4.1 mmol/L Final         Passed - Na in normal range and within 180 days    Sodium  Date Value Ref Range Status  03/26/2022 140 135 - 145 mmol/L Final  11/16/2012 141 137 - 147 mmol/L Final         Passed - Cr in normal range and within 180 days    Creat  Date Value Ref Range Status  03/23/2022 0.93 0.50 - 1.05 mg/dL Final   Creatinine, Ser  Date Value Ref Range Status  03/26/2022 0.82 0.44 - 1.00 mg/dL Final   Creatinine, Urine  Date Value Ref Range Status  01/20/2021 47 20 - 275 mg/dL Final

## 2022-06-11 ENCOUNTER — Encounter: Payer: Self-pay | Admitting: Family Medicine

## 2022-06-11 ENCOUNTER — Ambulatory Visit (INDEPENDENT_AMBULATORY_CARE_PROVIDER_SITE_OTHER): Payer: Medicare Other | Admitting: Family Medicine

## 2022-06-11 VITALS — BP 138/76 | HR 77 | Temp 98.5°F | Ht 66.0 in | Wt 163.0 lb

## 2022-06-11 DIAGNOSIS — R519 Headache, unspecified: Secondary | ICD-10-CM

## 2022-06-11 DIAGNOSIS — I10 Essential (primary) hypertension: Secondary | ICD-10-CM

## 2022-06-11 DIAGNOSIS — M810 Age-related osteoporosis without current pathological fracture: Secondary | ICD-10-CM | POA: Diagnosis not present

## 2022-06-11 DIAGNOSIS — I6521 Occlusion and stenosis of right carotid artery: Secondary | ICD-10-CM

## 2022-06-11 DIAGNOSIS — Z0000 Encounter for general adult medical examination without abnormal findings: Secondary | ICD-10-CM

## 2022-06-11 DIAGNOSIS — Z0001 Encounter for general adult medical examination with abnormal findings: Secondary | ICD-10-CM | POA: Diagnosis not present

## 2022-06-11 MED ORDER — ALENDRONATE SODIUM 70 MG PO TABS: 70.0000 mg | ORAL_TABLET | ORAL | 11 refills | Status: DC

## 2022-06-11 NOTE — Progress Notes (Signed)
Subjective:    Patient ID: [name], female    DOB: [birth year], 67 y.o.   MRN: [id number] Patient is here today for complete physical exam.  She had a mammogram in September and this was normal.  She had a colonoscopy in 2022 that revealed several polyps.  She is due for repeat colonoscopy in 3 years.  Bone density test in 2022 that showed a T-score of -2.8.  She is currently not on bisphosphonate therapy.  She also has known carotid artery stenosis.  She has 60 to 79% on 1 side and 40 to 59% on the other.  She is currently on an aspirin and a statin.  This is being monitored annually in October.  Her immunizations are up-to-date except for the COVID booster: Immunization History  Administered Date(s) Administered   Fluad Quad(high Dose 65+) 02/14/2020, 02/05/2021   Influenza,inj,Quad PF,6+ Mos 02/05/2017, 02/23/2018, 02/20/2019   Moderna Sars-Covid-2 Vaccination 10/10/2019, 11/08/2019, 05/19/2020   Pneumococcal Conjugate-13 07/26/2017   Pneumococcal Polysaccharide-23 02/14/2020   Tdap 03/28/2017   Zoster Recombinat (Shingrix) 08/24/2021, 11/30/2021    Past Medical History:  Diagnosis Date   Anxiety    Cataract    Chronic left shoulder pain    Headache    Hiatal hernia    small   Hyperlipidemia    Hypertension    Pre-diabetes    Schatzki's ring    Seasonal allergies    Tremor      Past Surgical History:  Procedure Laterality Date   CARDIAC CATHETERIZATION  2005   "[name] has essentially normal coronary arteries and normal left ventricular function.  She will be treated empirically with anti-reflux measures"   CATARACT EXTRACTION, BILATERAL     CHOLECYSTECTOMY     COLONOSCOPY    06/16/2004   MOQ:HUTMLY rectum/Diminutive polyps of the splenic flexure and the cecum/The remainder of the colonic mucosa appeared normal pathology (hyperplastic polyps).   COLONOSCOPY N/A 08/07/2015   Dr. Gala Williams: 6 mm descending colon tubular adenoma, multiple medium-mouthed  diverticula in sigmoid colon. surveillance 2022   COLONOSCOPY N/A 04/15/2021   Procedure: COLONOSCOPY;  Surgeon: Kimberly Dolin, MD;  Location: [facility];  Service: Endoscopy;  Laterality: N/A;  9:30am   ESOPHAGOGASTRODUODENOSCOPY  09/11/2001   [id number] ring otherwise normal esophagus/Normal stomach/Normal D1 and D2 status post passage of a 56 French Maloney dilator   ESOPHAGOGASTRODUODENOSCOPY N/A 11/11/2017   Procedure: ESOPHAGOGASTRODUODENOSCOPY (EGD);  Surgeon: Kimberly Dolin, MD; mild Schatzki ring s/p dilation and mild erosive gastropathy.   ESOPHAGOGASTRODUODENOSCOPY (EGD) WITH ESOPHAGEAL DILATION N/A 01/25/2013   Dr. Gala Williams- schatzki's ring- 50 F dilation. small hiatal hernia   HYSTEROSCOPY WITH D & C N/A 03/06/2019   Procedure: DILATATION AND CURETTAGE /HYSTEROSCOPY;  Surgeon: Kimberly Kind, MD;  Location: [facility];  Service: Gynecology;  Laterality: N/A;   MALONEY DILATION N/A 11/11/2017   Procedure: [name] DILATION;  Surgeon: Kimberly Dolin, MD;  Location: [facility];  Service: Endoscopy;  Laterality: N/A;   POLYPECTOMY N/A 03/06/2019   Procedure: POLYPECTOMY ( REMOVAL OF ENDOMETRIAL POLYP);  Surgeon: Kimberly Kind, MD;  Location: [facility];  Service: Gynecology;  Laterality: N/A;   POLYPECTOMY  04/15/2021   Procedure: POLYPECTOMY;  Surgeon: Kimberly Dolin, MD;  Location: [facility];  Service: Endoscopy;;   TUBAL LIGATION         Allergies  Allergen Reactions   Ciprofloxacin Shortness Of Breath   Latex Other (See Comments)    Burns skin   Vitamin  C [Bioflavonoid Products] Itching   Social History   Socioeconomic History   Marital status: Single    Spouse name: Not on file   Number of children: 3   Years of education: Not on file   Highest education level: Not on file  Occupational History   Occupation: English as a second language teacher: [employer]  Tobacco Use   Smoking status: Former    Packs/day: 0.25    Years: 35.00    Total pack years: 8.75     Types: Cigarettes    Quit date: 12/31/2012    Years since quitting: 9.4   Smokeless tobacco: Never  Vaping Use   Vaping Use: Never used  Substance and Sexual Activity   Alcohol use: Not Currently   Drug use: No   Sexual activity: Not Currently    Partners: Male    Birth control/protection: Post-menopausal, Surgical    Comment: tubal  Other Topics Concern   Not on file  Social History Narrative   Eats all food groups.    Wears seatbelt.    Has 3 children.   Prior smoker.   Attends church.    Used to work in [employer] fund[employer].    Drives.   Enjoys going out with family.    Right handed   One story home   Social Determinants of Health   Financial Resource Strain: Low Risk  (06/01/2022)   Overall Financial Resource Strain (CARDIA)    Difficulty of Paying Living Expenses: Not hard at all  Food Insecurity: No Food Insecurity (06/01/2022)   Hunger Vital Sign    Worried About Running Out of Food in the Last Year: Never true    Ran Out of Food in the Last Year: Never true  Transportation Needs: No Transportation Needs (06/01/2022)   PRAPARE - Hydrologist (Medical): No    Lack of Transportation (Non-Medical): No  Physical Activity: Sufficiently Active (06/01/2022)   Exercise Vital Sign    Days of Exercise per Week: 5 days    Minutes of Exercise per Session: 30 min  Stress: No Stress Concern Present (06/01/2022)   [place]    Feeling of Stress : Not at all  Social Connections: Moderately Integrated (06/01/2022)   Social Connection and Isolation Panel [NHANES]    Frequency of Communication with Friends and Family: More than three times a week    Frequency of Social Gatherings with Friends and Family: Three times a week    Attends Religious Services: More than 4 times per year    Active Member of Clubs or Organizations: Yes    Attends Archivist Meetings: More than 4 times per year     Marital Status: Never married  Intimate Partner Violence: Not At Risk (06/01/2022)   Humiliation, Afraid, Rape, and Kick questionnaire    Fear of Current or Ex-Partner: No    Emotionally Abused: No    Physically Abused: No    Sexually Abused: No   Family History  Problem Relation Age of Onset   Diabetes Other    Diabetes Mother    Heart disease Mother    Heart disease Father    Diabetes Sister    Diabetes Brother    Hypertension Brother    Diabetes Daughter    Hypertension Maternal Grandmother    Stroke Maternal Grandfather    Early death Paternal Grandfather    Colon cancer Neg Hx    Liver disease Neg  Hx       Review of Systems  All other systems reviewed and are negative.      Objective:   Physical Exam Vitals reviewed.  Constitutional:      General: She is not in acute distress.    Appearance: Normal appearance. She is well-developed and normal weight. She is not ill-appearing or toxic-appearing.  HENT:     Right Ear: Tympanic membrane and ear canal normal.     Left Ear: Tympanic membrane and ear canal normal.     Nose: Nose normal.     Right Sinus: No maxillary sinus tenderness or frontal sinus tenderness.     Left Sinus: No maxillary sinus tenderness or frontal sinus tenderness.     Mouth/Throat:     Mouth: Mucous membranes are moist.     Pharynx: Oropharynx is clear. No oropharyngeal exudate or posterior oropharyngeal erythema.  Eyes:     General: No visual field deficit.    Extraocular Movements: Extraocular movements intact.     Right eye: Normal extraocular motion and no nystagmus.     Left eye: Normal extraocular motion and no nystagmus.     Conjunctiva/sclera: Conjunctivae normal.     Pupils: Pupils are equal, round, and reactive to light.     Right eye: Pupil is round and reactive.     Left eye: Pupil is round and reactive.  Neck:     Vascular: Carotid bruit present.  Cardiovascular:     Rate and Rhythm: Normal rate and regular rhythm.     Heart  sounds: Normal heart sounds. No murmur heard.    No friction rub. No gallop.  Pulmonary:     Effort: Pulmonary effort is normal. No respiratory distress.     Breath sounds: Normal breath sounds. No stridor. No wheezing or rales.  Abdominal:     General: Abdomen is flat. Bowel sounds are normal. There is no distension.     Palpations: Abdomen is soft. There is no mass.     Tenderness: There is no abdominal tenderness. There is no guarding or rebound.     Hernia: No hernia is present.  Musculoskeletal:     Right hand: Normal. No swelling, deformity, tenderness or bony tenderness. Normal range of motion.     Left hand: Normal. No swelling, deformity, tenderness or bony tenderness. Normal range of motion.     Cervical back: Normal range of motion. No rigidity.     Right lower leg: No edema.     Left lower leg: No edema.  Neurological:     Mental Status: She is alert and oriented to person, place, and time. Mental status is at baseline.     Cranial Nerves: No cranial nerve deficit, dysarthria or facial asymmetry.     Sensory: No sensory deficit.     Motor: Tremor present. No weakness or abnormal muscle tone.     Coordination: Romberg sign negative. Coordination normal.     Gait: Gait normal.  Psychiatric:        Mood and Affect: Mood normal.        Behavior: Behavior normal.        Thought Content: Thought content normal.        Judgment: Judgment normal.         Assessment & Plan:  Benign essential HTN - Plan: CBC with Differential/Platelet, COMPLETE METABOLIC PANEL WITH GFR, Lipid panel  Encounter for Medicare annual wellness exam  Carotid stenosis, asymptomatic, right  Chronic daily headache  Osteoporosis,  unspecified osteoporosis type, unspecified pathological fracture presence Blood pressure today is excellent.  I will check a fasting lipid panel.  I like her LDL cholesterol to be less than 55 given her history of carotid artery stenosis.  She is currently taking an  aspirin.  She is due to recheck her carotid arteries in October.  Monitor a CBC and a CMP as well.  Recommended a COVID booster.  The remainder of her vaccinations are up-to-date.  Mammogram and colonoscopy are up-to-date.  I recommended treating her osteoporosis with Fosamax 70 mg weekly with calcium 1200 mg daily and vitamin D 1000 units daily.

## 2022-06-12 LAB — CBC WITH DIFFERENTIAL/PLATELET
Absolute Monocytes: 400 cells/uL (ref 200–950)
Basophils Absolute: 28 cells/uL (ref 0–200)
Basophils Relative: 0.4 %
Eosinophils Absolute: 200 cells/uL (ref 15–500)
Eosinophils Relative: 2.9 %
HCT: 37.6 % (ref 35.0–45.0)
Hemoglobin: 12.7 g/dL (ref 11.7–15.5)
Lymphs Abs: 3243 cells/uL (ref 850–3900)
MCH: 29.9 pg (ref 27.0–33.0)
MCHC: 33.8 g/dL (ref 32.0–36.0)
MCV: 88.5 fL (ref 80.0–100.0)
MPV: 9.7 fL (ref 7.5–12.5)
Monocytes Relative: 5.8 %
Neutro Abs: 3029 cells/uL (ref 1500–7800)
Neutrophils Relative %: 43.9 %
Platelets: 323 10*3/uL (ref 140–400)
RBC: 4.25 10*6/uL (ref 3.80–5.10)
RDW: 12.6 % (ref 11.0–15.0)
Total Lymphocyte: 47 %
WBC: 6.9 10*3/uL (ref 3.8–10.8)

## 2022-06-12 LAB — COMPLETE METABOLIC PANEL WITH GFR
AG Ratio: 1.5 (calc) (ref 1.0–2.5)
ALT: 12 U/L (ref 6–29)
AST: 16 U/L (ref 10–35)
Albumin: 4.6 g/dL (ref 3.6–5.1)
Alkaline phosphatase (APISO): 65 U/L (ref 37–153)
BUN: 12 mg/dL (ref 7–25)
CO2: 27 mmol/L (ref 20–32)
Calcium: 10.2 mg/dL (ref 8.6–10.4)
Chloride: 104 mmol/L (ref 98–110)
Creat: 0.92 mg/dL (ref 0.50–1.05)
Globulin: 3 g/dL (calc) (ref 1.9–3.7)
Glucose, Bld: 95 mg/dL (ref 65–99)
Potassium: 4.5 mmol/L (ref 3.5–5.3)
Sodium: 141 mmol/L (ref 135–146)
Total Bilirubin: 0.7 mg/dL (ref 0.2–1.2)
Total Protein: 7.6 g/dL (ref 6.1–8.1)
eGFR: 68 mL/min/{1.73_m2} (ref >=60)

## 2022-06-12 LAB — LIPID PANEL
Cholesterol: 175 mg/dL (ref <200)
HDL: 62 mg/dL (ref >=50)
LDL Cholesterol (Calc): 94 mg/dL (calc)
Non-HDL Cholesterol (Calc): 113 mg/dL (calc) (ref <130)
Total CHOL/HDL Ratio: 2.8 (calc) (ref <5.0)
Triglycerides: 99 mg/dL (ref <150)

## 2022-06-16 ENCOUNTER — Encounter: Payer: Medicare Other | Admitting: Family Medicine

## 2022-06-16 NOTE — Progress Notes (Signed)
Virtual Visit via Video note  I connected with [name] on 06/16/22 at 1357 by video and verified that I am speaking with the correct person using two identifiers. [name] is currently located at home and no one is currently with her during visit. The provider, Rubie Maid, FNP is located in their home at time of visit.  I discussed the limitations, risks, security and privacy concerns of performing an evaluation and management service by video and the availability of in person appointments. I also discussed with the patient that there may be a patient responsible charge related to this service. The patient expressed understanding and agreed to proceed.  Subjective: PCP: Susy Frizzle, MD  No chief complaint on file.   Patient is here today complaining of a pulsation in her throat throughout the day and a quiver in her lower stomach for a while. She has been to the ED for this and was determined it was her high BP, her symptoms are unchanged since that visit over a month ago. These symptoms always occur together. UNABLE TO COMPLETE VIRTUAL VISIT DUE TO POOR CONNECTION.    ROS: Per HPI Past Medical History:  Diagnosis Date   Anxiety    Carotid stenosis, asymptomatic, bilateral    Cataract    Chronic left shoulder pain    Headache    Hiatal hernia    small   Hyperlipidemia    Hypertension    Osteoporosis    Pre-diabetes    Schatzki's ring    Seasonal allergies    Tremor    Past Surgical History:  Procedure Laterality Date   CARDIAC CATHETERIZATION  2005   "Ms. Lowdermilk has essentially normal coronary arteries and normal left ventricular function.  She will be treated empirically with anti-reflux measures"   CATARACT EXTRACTION, BILATERAL     CHOLECYSTECTOMY     COLONOSCOPY    06/16/2004   LDJ:[id number] rectum/Diminutive polyps of the splenic flexure and the cecum/The remainder of the colonic mucosa appeared normal pathology (hyperplastic polyps).    COLONOSCOPY N/A 08/07/2015   Dr. Gala Romney: 6 mm descending colon tubular adenoma, multiple medium-mouthed diverticula in sigmoid colon. surveillance 2022   COLONOSCOPY N/A 04/15/2021   Procedure: COLONOSCOPY;  Surgeon: Daneil Dolin, MD;  Location: [facility];  Service: Endoscopy;  Laterality: N/A;  9:30am   ESOPHAGOGASTRODUODENOSCOPY  09/11/2001   [id number] ring otherwise normal esophagus/Normal stomach/Normal D1 and D2 status post passage of a 56 French Maloney dilator   ESOPHAGOGASTRODUODENOSCOPY N/A 11/11/2017   Procedure: ESOPHAGOGASTRODUODENOSCOPY (EGD);  Surgeon: Daneil Dolin, MD; mild Schatzki ring s/p dilation and mild erosive gastropathy.   ESOPHAGOGASTRODUODENOSCOPY (EGD) WITH ESOPHAGEAL DILATION N/A 01/25/2013   Dr. Gala Romney- schatzki's ring- 37 F dilation. small hiatal hernia   HYSTEROSCOPY WITH D & C N/A 03/06/2019   Procedure: DILATATION AND CURETTAGE /HYSTEROSCOPY;  Surgeon: Jonnie Kind, MD;  Location: [facility];  Service: Gynecology;  Laterality: N/A;   MALONEY DILATION N/A 11/11/2017   Procedure: [name] DILATION;  Surgeon: Daneil Dolin, MD;  [facility]: [facility];  Service: Endoscopy;  Laterality: N/A;   POLYPECTOMY N/A 03/06/2019   Procedure: POLYPECTOMY ( REMOVAL OF ENDOMETRIAL POLYP);  Surgeon: Jonnie Kind, MD;  Location: [facility];  Service: Gynecology;  Laterality: N/A;   POLYPECTOMY  04/15/2021   Procedure: POLYPECTOMY;  Surgeon: Daneil Dolin, MD;  [facility]: [facility];  Service: Endoscopy;;   TUBAL LIGATION     Allergies  Allergen Reactions  Ciprofloxacin Shortness Of Breath   Latex Other (See Comments)    Burns skin   Vitamin C [Bioflavonoid Products] Itching     Current Outpatient Medications:    acetaminophen (TYLENOL) 500 MG tablet, Take 500 mg by mouth every 6 (six) hours as needed for moderate pain or headache., Disp: , Rfl:    alendronate (FOSAMAX) 70 MG tablet, Take 1 tablet (70 mg total) by mouth every 7 (seven) days. Take with a full  glass of water on an empty stomach., Disp: 4 tablet, Rfl: 11   ALPRAZolam (XANAX) 0.5 MG tablet, TAKE 1 TABLET(0.5 MG) BY MOUTH TWICE DAILY AS NEEDED FOR ANXIETY, Disp: 60 tablet, Rfl: 2   amLODipine (NORVASC) 10 MG tablet, TAKE 1 TABLET(10 MG) BY MOUTH AT BEDTIME, Disp: 90 tablet, Rfl: 3   aspirin EC 81 MG tablet, Take 81 mg by mouth at bedtime. , Disp: , Rfl:    Azelastine-Fluticasone (DYMISTA) 137-50 MCG/ACT SUSP, Place 2 sprays into the nose daily., Disp: 23 g, Rfl: 11   Calcium 500 MG tablet, Take 500 mg by mouth in the morning and at bedtime., Disp: , Rfl:    carvedilol (COREG) 3.125 MG tablet, TAKE 1 TABLET(3.125 MG) BY MOUTH TWICE DAILY, Disp: 180 tablet, Rfl: 3   cholecalciferol (VITAMIN D3) 25 MCG (1000 UT) tablet, Take 2,000 Units by mouth daily., Disp: , Rfl:    cloNIDine (CATAPRES) 0.1 MG tablet, Take 0.1 mg by mouth See admin instructions. Take 1/2 tablet daily as needed for blood pressure, Disp: , Rfl:    Coenzyme Q10 (COQ10) 100 MG CAPS, Take 100 mg by mouth daily., Disp: , Rfl:    CRANBERRY PO, Take 1 each by mouth 2 (two) times a week. Chewables, Disp: , Rfl:    Cyanocobalamin (B-12) 2500 MCG TABS, Take 2,500 mcg by mouth daily., Disp: , Rfl:    dexlansoprazole (DEXILANT) 60 MG capsule, Take 1 capsule (60 mg total) by mouth daily., Disp: 30 capsule, Rfl: 11   EPINEPHrine 0.3 mg/0.3 mL IJ SOAJ injection, Inject 0.3 mg into the muscle as needed for anaphylaxis., Disp: 1 each, Rfl: 1   furosemide (LASIX) 20 MG tablet, Take 1 tablet (20 mg total) by mouth daily as needed (Weight Gain 3lbs in 24 hrs 5lbs in 1 week)., Disp: 30 tablet, Rfl: 1   hydroquinone 4 % cream, Apply 1 application topically 2 (two) times daily as needed (dark spots)., Disp: , Rfl:    hydrOXYzine (VISTARIL) 25 MG capsule, Take 1 capsule (25 mg total) by mouth every 8 (eight) hours as needed., Disp: 30 capsule, Rfl: 0   ipratropium (ATROVENT) 0.03 % nasal spray, INSTILL 2 SPRAYS IN EACH NOSTRIL EVERY 12 HOURS, Disp:  30 mL, Rfl: 12   loratadine (CLARITIN) 10 MG tablet, Take 10 mg by mouth daily as needed for allergies., Disp: , Rfl:    MAGNESIUM PO, Take 330 mg by mouth daily., Disp: , Rfl:    Omega-3 Fatty Acids (FISH OIL) 1000 MG CAPS, Take 1,000 mg by mouth 3 (three) times a week., Disp: , Rfl:    ondansetron (ZOFRAN) 4 MG tablet, TAKE 1 TABLET(4 MG) BY MOUTH EVERY 8 HOURS AS NEEDED FOR NAUSEA OR VOMITING, Disp: 30 tablet, Rfl: 0   Plecanatide (TRULANCE) 3 MG TABS, Take 3 mg by mouth daily., Disp: 30 tablet, Rfl: 11   rosuvastatin (CRESTOR) 10 MG tablet, Take 1 tablet (10 mg total) by mouth daily., Disp: 90 tablet, Rfl: 3   sodium chloride (OCEAN) 0.65 % SOLN  nasal spray, Place 1 spray into both nostrils as needed for congestion., Disp: , Rfl:    spironolactone (ALDACTONE) 25 MG tablet, Take 1 tablet (25 mg total) by mouth daily., Disp: 90 tablet, Rfl: 3   topiramate (TOPAMAX) 25 MG tablet, Take 1 tablet (25 mg total) by mouth at bedtime., Disp: 30 tablet, Rfl: 11   triamcinolone cream (KENALOG) 0.1 %, Apply 1 Application topically 2 (two) times daily., Disp: 30 g, Rfl: 0   valsartan (DIOVAN) 320 MG tablet, TAKE 1 TABLET BY MOUTH EVERY DAY, Disp: 90 tablet, Rfl: 0   vitamin E 400 UNIT capsule, Take 400 Units by mouth 3 (three) times a week., Disp: , Rfl:    zinc gluconate 50 MG tablet, Take 50 mg by mouth daily., Disp: , Rfl:   Observations/Objective: Physical Exam Assessment and Plan: There are no diagnoses linked to this encounter.  Follow Up Instructions: No follow-ups on file.   I discussed the assessment and treatment plan with the patient. The patient was provided an opportunity to ask questions and all were answered. The patient agreed with the plan and demonstrated an understanding of the instructions.   The patient was advised to call back or seek an in-person evaluation if the symptoms worsen or if the condition fails to improve as anticipated.  The above assessment and management plan was  discussed with the patient. The patient verbalized understanding of and has agreed to the management plan. Patient is aware to call the clinic if symptoms persist or worsen. Patient is aware when to return to the clinic for a follow-up visit. Patient educated on when it is appropriate to go to the emergency department.   Time call ended: 1408 call was dropped, unable to complete visit.  I provided 11 minutes of face-to-face time during this encounter.   Mila Merry, MSN, APRN, FNP-C [place]

## 2022-06-21 ENCOUNTER — Encounter: Payer: Self-pay | Admitting: Family Medicine

## 2022-06-21 ENCOUNTER — Encounter: Payer: Self-pay | Admitting: Cardiovascular Disease

## 2022-06-21 ENCOUNTER — Ambulatory Visit (INDEPENDENT_AMBULATORY_CARE_PROVIDER_SITE_OTHER): Payer: Medicare Other | Admitting: Family Medicine

## 2022-06-21 ENCOUNTER — Telehealth: Payer: Self-pay

## 2022-06-21 VITALS — BP 142/76 | HR 76 | Temp 98.2°F | Ht 66.0 in | Wt 167.0 lb

## 2022-06-21 DIAGNOSIS — R002 Palpitations: Secondary | ICD-10-CM

## 2022-06-21 NOTE — Telephone Encounter (Signed)
Left message for patient to call back  

## 2022-06-21 NOTE — Progress Notes (Signed)
CARDIOLOGY CONSULT NOTE       Patient ID: [name] MRN: [id number] DOB/AGE: Sep 24, 1955 67 y.o.  Admit date: (Not on file) Referring Physician: [place] Primary Physician: Susy Frizzle, MD Primary Cardiologist: Johnsie Cancel    HPI:  67 y.o. with labile HTN, HLD, history of esophageal web with dilatation and GERD Seen in [facility] October 2020  For hypertensive urgency BP 99991111 systolic. Dr Dennard Schaumann had started her on clonidine patch and adjusted her ARB Complained of fluttering in chest for 4 days Compliant with meds Labs ok except K 3.3 Troponin 18->19 no trend Not sure why checked as she had no chest pain. CXR NAD CT head No acute findings Recommended admission But left AMA ECG non acute with LAE non LVH no acute ST changes 02/02/19 Previous smoker quit in 2014   She follows with Dr Donzetta Matters [facility] She has left bruit duplex [id number] with [id number] LICA and [id number] RICA.  ABi"S reviewed 02/18/22 0.55 on right and 0.8 on left at rest   She has no documented history of CAD  Had normal cath 2014 with pain ascribed to GI cause  Non ischemic myovue November 2021  and 08/22/20 EF 62%  PET/CT 04/28/22 normal with stress EF 66% normal blood flow mild 3 vessel calcium noted    She has a long standing tremor which is stable with no signs Parkinson's   BP has been labile and elevated Current regimen includes  Norvasc 10 mg  Coreg 3.125 bid Clonidine 0.1 PRN Lasix 20 mg Valsartan 320 Aldactone 25 mg  06/21/22 complained to primary of palpitations Near daily lasting minutes More skips not sustained rapid beating Feels it in her throat and makes her feel queezy  ECG in office was NSR She now feels better and is on coreg already  Some stress at home Has a brother and two grand kids living with her Right leg gets a little weak with Walking but no rest pain     ROS All other systems reviewed and negative except as noted above  Past Medical History:  Diagnosis Date   Anxiety    Carotid stenosis,  asymptomatic, bilateral    Cataract    Chronic left shoulder pain    Headache    Hiatal hernia    small   Hyperlipidemia    Hypertension    Osteoporosis    Pre-diabetes    Schatzki's ring    Seasonal allergies    Tremor     Family History  Problem Relation Age of Onset   Diabetes Other    Diabetes Mother    Heart disease Mother    Heart disease Father    Diabetes Sister    Diabetes Brother    Hypertension Brother    Diabetes Daughter    Hypertension Maternal Grandmother    Stroke Maternal Grandfather    Early death Paternal Grandfather    Colon cancer Neg Hx    Liver disease Neg Hx     Social History   Socioeconomic History   Marital status: Single    Spouse name: Not on file   Number of children: 3   Years of education: Not on file   Highest education level: Not on file  Occupational History   Occupation: English as a second language teacher: [employer]  Tobacco Use   Smoking status: Former    Packs/day: 0.25    Years: 35.00    Total pack years: 8.75    Types: Cigarettes    Quit  date: 12/31/2012    Years since quitting: 9.5   Smokeless tobacco: Never  Vaping Use   Vaping Use: Never used  Substance and Sexual Activity   Alcohol use: Not Currently   Drug use: No   Sexual activity: Not Currently    Partners: Male    Birth control/protection: Post-menopausal, Surgical    Comment: tubal  Other Topics Concern   Not on file  Social History Narrative   Eats all food groups.    Wears seatbelt.    Has 3 children.   Prior smoker.   Attends church.    Used to work in [employer] fundraiser.    Drives.   Enjoys going out with family.    Right handed   One story home   Social Determinants of Health   Financial Resource Strain: Low Risk  (06/01/2022)   Overall Financial Resource Strain (CARDIA)    Difficulty of Paying Living Expenses: Not hard at all  Food Insecurity: No Food Insecurity (06/01/2022)   Hunger Vital Sign    Worried About Running Out of Food in the Last  Year: Never true    Ran Out of Food in the Last Year: Never true  Transportation Needs: No Transportation Needs (06/01/2022)   PRAPARE - Hydrologist (Medical): No    Lack of Transportation (Non-Medical): No  Physical Activity: Sufficiently Active (06/01/2022)   Exercise Vital Sign    Days of Exercise per Week: 5 days    Minutes of Exercise per Session: 30 min  Stress: No Stress Concern Present (06/01/2022)   [place]    Feeling of Stress : Not at all  Social Connections: Moderately Integrated (06/01/2022)   Social Connection and Isolation Panel [NHANES]    Frequency of Communication with Friends and Family: More than three times a week    Frequency of Social Gatherings with Friends and Family: Three times a week    Attends Religious Services: More than 4 times per year    Active Member of Clubs or Organizations: Yes    Attends Archivist Meetings: More than 4 times per year    Marital Status: Never married  Intimate Partner Violence: Not At Risk (06/01/2022)   Humiliation, Afraid, Rape, and Kick questionnaire    Fear of Current or Ex-Partner: No    Emotionally Abused: No    Physically Abused: No    Sexually Abused: No    Past Surgical History:  Procedure Laterality Date   CARDIAC CATHETERIZATION  2005   "[name] has essentially normal coronary arteries and normal left ventricular function.  She will be treated empirically with anti-reflux measures"   CATARACT EXTRACTION, BILATERAL     CHOLECYSTECTOMY     COLONOSCOPY    06/16/2004   [id number] rectum/Diminutive polyps of the splenic flexure and the cecum/The remainder of the colonic mucosa appeared normal pathology (hyperplastic polyps).   COLONOSCOPY N/A 08/07/2015   Dr. Gala Romney: 6 mm descending colon tubular adenoma, multiple medium-mouthed diverticula in sigmoid colon. surveillance 2022   COLONOSCOPY N/A 04/15/2021    Procedure: COLONOSCOPY;  Surgeon: Daneil Dolin, MD;  Location: [facility];  Service: Endoscopy;  Laterality: N/A;  9:30am   ESOPHAGOGASTRODUODENOSCOPY  09/11/2001   [id number] ring otherwise normal esophagus/Normal stomach/Normal D1 and D2 status post passage of a 56 [place] Maloney dilator   ESOPHAGOGASTRODUODENOSCOPY N/A 11/11/2017   Procedure: ESOPHAGOGASTRODUODENOSCOPY (EGD);  Surgeon: Daneil Dolin, MD; mild Schatzki ring s/p dilation  and mild erosive gastropathy.   ESOPHAGOGASTRODUODENOSCOPY (EGD) WITH ESOPHAGEAL DILATION N/A 01/25/2013   Dr. Gala Romney- schatzki's ring- 26 F dilation. small hiatal hernia   HYSTEROSCOPY WITH D & C N/A 03/06/2019   Procedure: DILATATION AND CURETTAGE /HYSTEROSCOPY;  Surgeon: Jonnie Kind, MD;  Location: [facility];  Service: Gynecology;  Laterality: N/A;   MALONEY DILATION N/A 11/11/2017   Procedure: [name] DILATION;  Surgeon: Daneil Dolin, MD;  Location: [facility];  Service: Endoscopy;  Laterality: N/A;   POLYPECTOMY N/A 03/06/2019   Procedure: POLYPECTOMY ( REMOVAL OF ENDOMETRIAL POLYP);  Surgeon: Jonnie Kind, MD;  Location: [facility];  Service: Gynecology;  Laterality: N/A;   POLYPECTOMY  04/15/2021   Procedure: POLYPECTOMY;  Surgeon: Daneil Dolin, MD;  Location: [facility];  Service: Endoscopy;;   TUBAL LIGATION          Physical Exam: Blood pressure 138/70, pulse 71, height '5\' 6"'$  (1.676 m), weight 163 lb 12.8 oz (74.3 kg), SpO2 98 %.    Affect appropriate Healthy:  appears stated age 55: normal Neck supple with no adenopathy JVP left carotid bruit Lungs clear with no wheezing and good diaphragmatic motion Heart:  S1/S2 short 2/6 SEM murmur, no rub, gallop or click PMI normal Abdomen: benighn, BS positve, no tenderness, no AAA no bruit.  No HSM or HJR Decreased pedal pulses on right Varicosities in legs  Neuro non-focal UE tremors  Skin warm and dry Post arthroscopy left knee  Decreased pedal pulses    Labs:    Lab Results  Component Value Date   WBC 6.9 06/11/2022   HGB 12.7 06/11/2022   HCT 37.6 06/11/2022   MCV 88.5 06/11/2022   PLT 323 06/11/2022    No results for input(s): "NA", "K", "CL", "CO2", "BUN", "CREATININE", "CALCIUM", "PROT", "BILITOT", "ALKPHOS", "ALT", "AST", "GLUCOSE" in the last 168 hours.  Invalid input(s): "LABALBU"  Lab Results  Component Value Date   CKTOTAL 78 06/10/2020   TROPONINI <0.03 12/28/2017    Lab Results  Component Value Date   CHOL 175 06/11/2022   CHOL 163 01/20/2021   CHOL 157 06/27/2018   Lab Results  Component Value Date   HDL 62 06/11/2022   HDL 60 01/20/2021   HDL 66 06/27/2018   Lab Results  Component Value Date   LDLCALC 94 06/11/2022   LDLCALC 89 01/20/2021   LDLCALC 77 06/27/2018   Lab Results  Component Value Date   TRIG 99 06/11/2022   TRIG 62 01/20/2021   TRIG 49 06/27/2018   Lab Results  Component Value Date   CHOLHDL 2.8 06/11/2022   CHOLHDL 2.7 01/20/2021   CHOLHDL 2.4 06/27/2018   No results found for: "LDLDIRECT"    Radiology: No results found.  EKG: 03/26/22 SR rate 85 nonspecific ST changes poor R wave progression    ASSESSMENT AND PLAN:   1. CAD:  multiple normal stress tests most recent PET/CT 04/28/22 mild 3 vessel calcium noted with normal perfusion , stress EF and blood flow  2. HTN:  well controlled on multi drug regimen  3.  HLD:  On statin labs with primary  4. Carotid Dx:  f/u Dr Donzetta Matters right ICA 60-79% f/u duplex 02/2023  5. GI: history of GERD and esophageal dilatation seen by GI recently and repeat EGD deferred Continue Linzess and protonix  6. PVD:  severe decrease in ABI on right  no limiting claudication f/u Dr Donzetta Matters   7. Ortho: left knee arthroscopy improved  8.  Palpitations: on beta blocker with low pulse Likely PACls improved no need for monitor  9. Varicosities:  known bilateral reflux Diuretics f/u [facility]   F/U in a year  F/U [facility] Donzetta Matters   Duplex October carotids  ABI"s October  Above  to be ordered by [facility]    Signed: Jenkins Rouge 07/05/2022, 10:33 AM

## 2022-06-21 NOTE — Telephone Encounter (Signed)
Called patient back to let her know Dr. Kyla Balzarine recommendations. Patient verbalized understanding.

## 2022-06-21 NOTE — Telephone Encounter (Signed)
Error

## 2022-06-21 NOTE — Telephone Encounter (Signed)
-----   Message from Josue Hector, MD sent at 06/21/2022  2:58 PM EST ----- Needs 30 day monitor for palpitations

## 2022-06-21 NOTE — Telephone Encounter (Signed)
Patient is returning call.  °

## 2022-06-21 NOTE — Progress Notes (Signed)
Subjective:    Patient ID: [name], female    DOB: 18-Sep-1955, 67 y.o.   MRN: [id number]  Patient is a very pleasant 67 year old African-American female who states that for the last few months, she can "feel her heart beating" in her throat.  She states that it was heartbeat rapid irregularly.  She states that will start to quiver.  Sometimes it feels like her heart stops and pulses.  When she has these episodes of palpitation, it makes her feel queasy in her stomach and she can feel the abnormal heartbeats transmitted up into her neck.  She denies syncope or near syncope.  She denies chest pain or shortness of breath.  She states that the abnormal heartbeats will continue for 3 or 4 minutes at a time.  Today she is in normal sinus rhythm.  The area on her leg is a varicose vein.  There is no palpable abnormality other than the varicose vein.  There is no mass or cyst or lesion Past Medical History:  Diagnosis Date  . Anxiety   . Carotid stenosis, asymptomatic, bilateral   . Cataract   . Chronic left shoulder pain   . Headache   . Hiatal hernia    small  . Hyperlipidemia   . Hypertension   . Osteoporosis   . Pre-diabetes   . Schatzki's ring   . Seasonal allergies   . Tremor      Past Surgical History:  Procedure Laterality Date  . CARDIAC CATHETERIZATION  2005   "Ms. Flager has essentially normal coronary arteries and normal left ventricular function.  She will be treated empirically with anti-reflux measures"  . CATARACT EXTRACTION, BILATERAL    . CHOLECYSTECTOMY    . COLONOSCOPY    06/16/2004   [id number] rectum/Diminutive polyps of the splenic flexure and the cecum/The remainder of the colonic mucosa appeared normal pathology (hyperplastic polyps).  . COLONOSCOPY N/A 08/07/2015   Dr. Gala Romney: 6 mm descending colon tubular adenoma, multiple medium-mouthed diverticula in sigmoid colon. surveillance 2022  . COLONOSCOPY N/A 04/15/2021   Procedure: COLONOSCOPY;  Surgeon:  Daneil Dolin, MD;  [facility]: [facility];  Service: Endoscopy;  Laterality: N/A;  9:30am  . ESOPHAGOGASTRODUODENOSCOPY  09/11/2001   [id number] ring otherwise normal esophagus/Normal stomach/Normal D1 and D2 status post passage of a 56 [place] Maloney dilator  . ESOPHAGOGASTRODUODENOSCOPY N/A 11/11/2017   Procedure: ESOPHAGOGASTRODUODENOSCOPY (EGD);  Surgeon: Daneil Dolin, MD; mild Schatzki ring s/p dilation and mild erosive gastropathy.  . ESOPHAGOGASTRODUODENOSCOPY (EGD) WITH ESOPHAGEAL DILATION N/A 01/25/2013   Dr. Gala Romney- schatzki's ring- 53 F dilation. small hiatal hernia  . HYSTEROSCOPY WITH D & C N/A 03/06/2019   Procedure: DILATATION AND CURETTAGE /HYSTEROSCOPY;  Surgeon: Jonnie Kind, MD;  Location: [facility];  Service: Gynecology;  Laterality: N/A;  . [name] DILATION N/A 11/11/2017   Procedure: [name] DILATION;  Surgeon: Daneil Dolin, MD;  [facility];  Service: Endoscopy;  Laterality: N/A;  . POLYPECTOMY N/A 03/06/2019   Procedure: POLYPECTOMY ( REMOVAL OF ENDOMETRIAL POLYP);  Surgeon: Jonnie Kind, MD;  Location: [facility];  Service: Gynecology;  Laterality: N/A;  . POLYPECTOMY  04/15/2021   Procedure: POLYPECTOMY;  Surgeon: Daneil Dolin, MD;  [facility];  Service: Endoscopy;;  . TUBAL LIGATION         Allergies  Allergen Reactions  . Ciprofloxacin Shortness Of Breath  . Latex Other (See Comments)    Burns skin  . Vitamin C [Bioflavonoid Products]  Itching   Social History   Socioeconomic History  . Marital status: Single    Spouse name: Not on file  . Number of children: 3  . Years of education: Not on file  . Highest education level: Not on file  Occupational History  . Occupation: English as a second language teacher: [employer]  Tobacco Use  . Smoking status: Former    Packs/day: 0.25    Years: 35.00    Total pack years: 8.75    Types: Cigarettes    Quit date: 12/31/2012    Years since quitting: 9.4  . Smokeless tobacco:  Never  Vaping Use  . Vaping Use: Never used  Substance and Sexual Activity  . Alcohol use: Not Currently  . Drug use: No  . Sexual activity: Not Currently    Partners: Male    Birth control/protection: Post-menopausal, Surgical    Comment: tubal  Other Topics Concern  . Not on file  Social History Narrative   Eats all food groups.    Wears seatbelt.    Has 3 children.   Prior smoker.   Attends church.    Used to work in [employer] fundraiser.    Drives.   Enjoys going out with family.    Right handed   One story home   Social Determinants of Health   Financial Resource Strain: Low Risk  (06/01/2022)   Overall Financial Resource Strain (CARDIA)   . Difficulty of Paying Living Expenses: Not hard at all  Food Insecurity: No Food Insecurity (06/01/2022)   Hunger Vital Sign   . Worried About Charity fundraiser in the Last Year: Never true   . Ran Out of Food in the Last Year: Never true  Transportation Needs: No Transportation Needs (06/01/2022)   PRAPARE - Transportation   . Lack of Transportation (Medical): No   . Lack of Transportation (Non-Medical): No  Physical Activity: Sufficiently Active (06/01/2022)   Exercise Vital Sign   . Days of Exercise per Week: 5 days   . Minutes of Exercise per Session: 30 min  Stress: No Stress Concern Present (06/01/2022)   [place]   . Feeling of Stress : Not at all  Social Connections: Moderately Integrated (06/01/2022)   Social Connection and Isolation Panel [NHANES]   . Frequency of Communication with Friends and Family: More than three times a week   . Frequency of Social Gatherings with Friends and Family: Three times a week   . Attends Religious Services: More than 4 times per year   . Active Member of Clubs or Organizations: Yes   . Attends Archivist Meetings: More than 4 times per year   . Marital Status: Never married  Intimate Partner Violence: Not At Risk  (06/01/2022)   Humiliation, Afraid, Rape, and Kick questionnaire   . Fear of Current or Ex-Partner: No   . Emotionally Abused: No   . Physically Abused: No   . Sexually Abused: No   Family History  Problem Relation Age of Onset  . Diabetes Other   . Diabetes Mother   . Heart disease Mother   . Heart disease Father   . Diabetes Sister   . Diabetes Brother   . Hypertension Brother   . Diabetes Daughter   . Hypertension Maternal Grandmother   . Stroke Maternal Grandfather   . Early death Paternal Grandfather   . Colon cancer Neg Hx   . Liver disease Neg Hx  Review of Systems  All other systems reviewed and are negative.      Objective:   Physical Exam Vitals reviewed.  Constitutional:      Appearance: She is well-developed.  HENT:     Nose:     Right Sinus: No maxillary sinus tenderness or frontal sinus tenderness.     Left Sinus: No maxillary sinus tenderness or frontal sinus tenderness.  Eyes:     General: No visual field deficit.    Extraocular Movements:     Right eye: Normal extraocular motion and no nystagmus.     Left eye: Normal extraocular motion and no nystagmus.     Conjunctiva/sclera: Conjunctivae normal.     Pupils:     Right eye: Pupil is round and reactive.     Left eye: Pupil is round and reactive.  Neck:     Vascular: No carotid bruit.  Cardiovascular:     Rate and Rhythm: Normal rate and regular rhythm.     Heart sounds: Normal heart sounds. No murmur heard.    No friction rub. No gallop.  Pulmonary:     Effort: Pulmonary effort is normal. No respiratory distress.     Breath sounds: Normal breath sounds. No stridor. No wheezing or rales.  Abdominal:     General: Abdomen is flat. Bowel sounds are normal. There is no distension.     Palpations: Abdomen is soft.     Tenderness: There is no abdominal tenderness. There is no guarding.  Musculoskeletal:     Right hand: Normal. No swelling, deformity, tenderness or bony tenderness. Normal  range of motion.     Left hand: Normal. No swelling, deformity, tenderness or bony tenderness. Normal range of motion.     Cervical back: Normal range of motion. No rigidity.  Lymphadenopathy:     Cervical: No cervical adenopathy.  Neurological:     Mental Status: She is alert and oriented to person, place, and time. Mental status is at baseline.     Cranial Nerves: No cranial nerve deficit, dysarthria or facial asymmetry.     Sensory: No sensory deficit.     Motor: Tremor present. No weakness or abnormal muscle tone.     Coordination: Romberg sign negative. Coordination normal.     Gait: Gait normal.        Assessment & Plan:   Palpitations - Plan: Ambulatory referral to Cardiology Patient is having palpitations.  I suspect PVCs/PACs or even SVT.  Consult her cardiologist for cardiac monitor to evaluate further to rule out more dangerous arrhythmias.  Today her exam is completely benign

## 2022-06-26 ENCOUNTER — Ambulatory Visit: Payer: Medicare Other | Attending: Cardiovascular Disease

## 2022-06-26 DIAGNOSIS — R002 Palpitations: Secondary | ICD-10-CM

## 2022-07-01 ENCOUNTER — Ambulatory Visit: Payer: Medicare Other | Admitting: Cardiovascular Disease

## 2022-07-05 ENCOUNTER — Encounter: Payer: Self-pay | Admitting: Cardiovascular Disease

## 2022-07-05 ENCOUNTER — Ambulatory Visit: Payer: Medicare Other | Attending: Cardiovascular Disease | Admitting: Cardiovascular Disease

## 2022-07-05 ENCOUNTER — Telehealth: Payer: Self-pay

## 2022-07-05 VITALS — BP 138/70 | HR 71 | Ht 66.0 in | Wt 163.8 lb

## 2022-07-05 DIAGNOSIS — I10 Essential (primary) hypertension: Secondary | ICD-10-CM

## 2022-07-05 DIAGNOSIS — I6523 Occlusion and stenosis of bilateral carotid arteries: Secondary | ICD-10-CM | POA: Diagnosis not present

## 2022-07-05 DIAGNOSIS — R002 Palpitations: Secondary | ICD-10-CM | POA: Diagnosis not present

## 2022-07-05 DIAGNOSIS — I739 Peripheral vascular disease, unspecified: Secondary | ICD-10-CM | POA: Diagnosis not present

## 2022-07-05 DIAGNOSIS — E785 Hyperlipidemia, unspecified: Secondary | ICD-10-CM

## 2022-07-05 NOTE — Telephone Encounter (Signed)
Pt called with an update as requested. Pt states that the Gerrard works sometimes. Pt also states that the Trulance does not work that she went back to taking the Linzess so that she would at least have somewhat of a bm. There has been no change in the patient's weight. Pt is aware that you are out of the office until tomorrow.

## 2022-07-05 NOTE — Patient Instructions (Signed)
Medication Instructions:  Your physician recommends that you continue on your current medications as directed. Please refer to the Current Medication list given to you today.  *If you need a refill on your cardiac medications before your next appointment, please call your pharmacy*   Lab Work: NONE   If you have labs (blood work) drawn today and your tests are completely normal, you will receive your results only by: [facility] [place] (if you have MyChart) OR A paper copy in the mail If you have any lab test that is abnormal or we need to change your treatment, we will call you to review the results.   Testing/Procedures: NONE    Follow-Up: At [facility], you and your health needs are our priority.  As part of our continuing mission to provide you with exceptional heart care, we have created designated Provider Care Teams.  These Care Teams include your primary Cardiologist (physician) and Advanced Practice Providers (APPs -  Physician Assistants and Nurse Practitioners) who all work together to provide you with the care you need, when you need it.  We recommend signing up for the patient portal called "MyChart".  Sign up information is provided on this After Visit Summary.  MyChart is used to connect with patients for Virtual Visits (Telemedicine).  Patients are able to view lab/test results, encounter notes, upcoming appointments, etc.  Non-urgent messages can be sent to your provider as well.   To learn more about what you can do with MyChart, go to [email or web address].    Your next appointment:   1 year(s)  Provider:   You may see Jenkins Rouge, MD or one of the following Advanced Practice Providers on your designated Care Team:   Bernerd Pho, PA-C  Ermalinda Barrios, PA-C     Other Instructions Thank you for choosing [facility]!

## 2022-07-08 ENCOUNTER — Encounter: Payer: Self-pay | Admitting: Radiology

## 2022-07-19 ENCOUNTER — Encounter: Payer: Self-pay | Admitting: Physician Assistant

## 2022-07-19 ENCOUNTER — Encounter: Payer: Self-pay | Admitting: Family Medicine

## 2022-07-19 ENCOUNTER — Ambulatory Visit: Payer: Medicare Other | Admitting: Physician Assistant

## 2022-07-19 VITALS — BP 149/78 | HR 81 | Resp 18 | Ht 66.0 in | Wt 165.0 lb

## 2022-07-19 DIAGNOSIS — R519 Headache, unspecified: Secondary | ICD-10-CM

## 2022-07-19 DIAGNOSIS — G250 Essential tremor: Secondary | ICD-10-CM | POA: Diagnosis not present

## 2022-07-19 DIAGNOSIS — G8929 Other chronic pain: Secondary | ICD-10-CM

## 2022-07-19 NOTE — Patient Instructions (Addendum)
  Continue metoprolol for BP and for tremors  Follow up in 6 months  Continue the CoQ, B6 and Mag  Recommend good stress management    Limit use of pain relievers to no more than 2 days out of the week.  These medications include acetaminophen, NSAIDs (ibuprofen/Advil/Motrin, naproxen/Aleve, triptans (Imitrex/sumatriptan), Excedrin, and narcotics.  This will help reduce risk of rebound headaches. Be aware of common food triggers:  - Caffeine:  coffee, black tea, cola, Mt. Dew  - Chocolate  - Dairy:  aged cheeses (brie, blue, cheddar, gouda, [place], provolone, [place], Swiss, etc), chocolate milk, buttermilk, sour cream, limit eggs and yogurt  - Nuts, peanut butter  - Alcohol  - Cereals/grains:  FRESH breads (fresh bagels, sourdough, doughnuts), yeast productions  - Processed/canned/aged/cured meats (pre-packaged deli meats, hotdogs)  - MSG/glutamate:  soy sauce, flavor enhancer, pickled/preserved/marinated foods  - Sweeteners:  aspartame (Equal, Nutrasweet).  Sugar and Splenda are okay  - Vegetables:  legumes (lima beans, lentils, snow peas, fava beans, pinto peans, peas, garbanzo beans), sauerkraut, onions, olives, pickles  - Fruit:  avocados, bananas, citrus fruit (orange, lemon, grapefruit), mango  - Other:  Frozen meals, macaroni and cheese Routine exercise Stay adequately hydrated (aim for 64 oz water daily) Keep headache diary Maintain proper stress management Maintain proper sleep hygiene Do not skip meals Consider supplements:  magnesium citrate 400mg  daily, riboflavin 400mg  daily, coenzyme Q10 100mg  three times daily. 50 25

## 2022-07-19 NOTE — Progress Notes (Signed)
[facility] Clinic Note - Progress Visit   Date: 07/19/22  [name] MRN: [id number] DOB: 05/16/1955   History of Present Illness:  Onset:  Night headaches stopped after beginning to take Singulair having better BP control "now I am taking just Claritin "    Quality:  throbbing , feels tight and pulsating  Intensity: 5/10 (improved from prior) Location: B temporal across forehead and sometimes in the occipital area  Duration: 15 mins (improved from prior) Frequency: 3 times a week lasting 4-5 h worse when stressed. Associated symptoms: nausea  Aura: wiggly lines   Activity Does home week yoga during that time she does not report any headaches. Aggravating factors: Sounds. Dreams have gotten better.   Relieving factors: trying to calm herself down, breathe, and takes tylenol and xanax   Current abortive medications: tylenol every 2-3 weeks  Current prophylactic medications: "magnesium, B6, CoQ "  Past abortive medications:  Past prophylactic medications: Topamax "tired and no energy"    Family history: GF stroke, no family or personal history of headaches or seizures Smoker: no  Alcohol: no  Caffeine: none Sleep: Better  Stress: Her brother and 2 grandkids, lives with her parents and stress to her life   CTA 09/09/2021 personally reviewed, agree with radiology: 1. No emergent finding. 2. At least 70% atheromatous narrowing of the right common carotid bifurcation and right cavernous ICA. 50% stenosis of the paraclinoid right ICA. 3. 45% narrowing at the right vertebral origin. 4. Mild intracranial branch atherosclerosis.  MRI of the brain 10/02/2021 no acute intracranial process. No evidence of acute or subacute infarct.    Initial visit 09/2021   Patient is a very pleasant 67 year old woman f initially seen at [facility] neurology for evaluation of essential tremor, currently on metoprolol,  history of hypertension, hyperlipidemia,  anxiety, and a history of abdominal aortic aneurysm and prediabetes.  She recently had been seen at the ED on 10/02/2021 with complaints of significant headaches, "jumbled words ", and slight "left hand heaviness with the headaches ".  This last month, her blood pressure has been significantly uncontrolled reaching up to 200s.  She has some floaters in her right eye, otherwise she denies any visual complaints.  She has chronic bilateral foot tingling, but there is no new areas of numbness.  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  Also seen, 45% narrowing of the right vertebral origin.  Pending on these findings, an MRI of the brain without contrast was performed, negative for stroke or other abnormality is.  It was suspected that these symptoms were due to hypertensive emergency, which had resolved at the time of neurological evaluation.  She was discharged on gabapentin 300 mg twice daily, but the patient reduced it to 300 mg at night, because of increased sleepiness.  Patient is on statin, metoprolol, amlodipine, and clonidine as needed as well. Denies any history of TIA. Denies vertigo dizziness or vision changes. Denies dysphagia. No confusion Denies any fever or chills, or night sweats. No tobacco. No new meds or hormonal supplements. Does take a regular ASA a day, with no other antiplatelets or anticoagulants.Denies any recent long distance trips or recent surgeries. No sick contacts. No new stressors present in personal life.  Patient is compliant with his medications.Patient is active. She is here  in follow-up.  She is alone during this visit.  Out-side paper records, electronic medical record, and images have been reviewed where available and summarized as:  Lab Results  Component Value Date   HGBA1C 6.1 (H)  11/05/2021   Lab Results  Component Value Date   VITAMINB12 >2,000 (H) 01/20/2021   Lab Results  Component Value Date   TSH 1.84 10/29/2019   Lab Results  Component Value Date   ESRSEDRATE 14 06/10/2020    Past Medical History:  Diagnosis Date   Anxiety    Carotid stenosis, asymptomatic, bilateral    Cataract    Chronic left shoulder pain    Headache    Hiatal hernia    small   Hyperlipidemia    Hypertension    Osteoporosis    Pre-diabetes    Schatzki's ring    Seasonal allergies    Tremor     Past Surgical History:  Procedure Laterality Date   CARDIAC CATHETERIZATION  2005   "[name] has essentially normal coronary arteries and normal left ventricular function.  She will be treated empirically with anti-reflux measures"   CATARACT EXTRACTION, BILATERAL     CHOLECYSTECTOMY     COLONOSCOPY    06/16/2004   [id number] rectum/Diminutive polyps of the splenic flexure and the cecum/The remainder of the colonic mucosa appeared normal pathology (hyperplastic polyps).   COLONOSCOPY N/A 08/07/2015   Dr. Gala Romney: 6 mm descending colon tubular adenoma, multiple medium-mouthed diverticula in sigmoid colon. surveillance 2022   COLONOSCOPY N/A 04/15/2021   Procedure: COLONOSCOPY;  Surgeon: Daneil Dolin, MD;  Location: [facility];  Service: Endoscopy;  Laterality: N/A;  9:30am   ESOPHAGOGASTRODUODENOSCOPY  09/11/2001   [id number] ring otherwise normal esophagus/Normal stomach/Normal D1 and D2 status post passage of a 56 French Maloney dilator   ESOPHAGOGASTRODUODENOSCOPY N/A 11/11/2017   Procedure: ESOPHAGOGASTRODUODENOSCOPY (EGD);  Surgeon: Daneil Dolin, MD; mild Schatzki ring s/p dilation and mild erosive gastropathy.   ESOPHAGOGASTRODUODENOSCOPY (EGD) WITH ESOPHAGEAL DILATION N/A 01/25/2013   Dr. Gala Romney- schatzki's ring- 29 F dilation. small hiatal hernia   HYSTEROSCOPY WITH D & C N/A 03/06/2019   Procedure: DILATATION AND CURETTAGE /HYSTEROSCOPY;  Surgeon: Jonnie Kind, MD;  Location: [facility];  Service: Gynecology;  Laterality: N/A;   MALONEY DILATION N/A 11/11/2017   Procedure: [name] DILATION;  Surgeon: Daneil Dolin, MD;  Location: [facility];  Service: Endoscopy;  Laterality: N/A;   POLYPECTOMY N/A 03/06/2019   Procedure: POLYPECTOMY ( REMOVAL OF ENDOMETRIAL POLYP);  Surgeon: Jonnie Kind, MD;  Location: [facility];  Service: Gynecology;  Laterality: N/A;   POLYPECTOMY  04/15/2021   Procedure: POLYPECTOMY;  Surgeon: Daneil Dolin, MD;  Location: [facility];  Service: Endoscopy;;   TUBAL LIGATION       Medications:  Outpatient Encounter Medications as of 07/19/2022  Medication Sig Note   acetaminophen (TYLENOL) 500 MG tablet Take 500 mg by mouth every 6 (six) hours as needed for moderate pain or headache.    alendronate (FOSAMAX) 70 MG tablet Take 1 tablet (70 mg total) by mouth every 7 (seven) days. Take with a full glass of water on an empty stomach.    ALPRAZolam (XANAX) 0.5 MG tablet TAKE 1 TABLET(0.5 MG) BY MOUTH TWICE DAILY AS NEEDED FOR ANXIETY    amLODipine (NORVASC) 10 MG tablet TAKE 1 TABLET(10 MG) BY MOUTH AT BEDTIME    aspirin EC 81 MG tablet Take 81 mg by mouth at bedtime.  Azelastine-Fluticasone (DYMISTA) 137-50 MCG/ACT SUSP Place 2 sprays into the nose daily.    Calcium 500 MG tablet Take 500 mg by mouth in the morning and at bedtime.    carvedilol (COREG) 3.125 MG tablet TAKE 1 TABLET(3.125 MG) BY MOUTH TWICE DAILY    cholecalciferol (VITAMIN D3) 25 MCG (1000 UT) tablet Take 2,000 Units by mouth daily.    cloNIDine (CATAPRES) 0.1 MG tablet Take 0.1 mg by mouth See admin instructions. Take 1/2 tablet daily as needed for blood pressure 04/13/2022: Pt takes as needed.    Coenzyme Q10 (COQ10) 100 MG CAPS Take 100 mg by mouth daily.    CRANBERRY PO Take 1 each by mouth 2 (two) times a week. Chewables    Cyanocobalamin (B-12) 2500 MCG TABS Take 2,500 mcg by mouth daily.    dexlansoprazole (DEXILANT) 60 MG capsule Take 1  capsule (60 mg total) by mouth daily.    EPINEPHrine 0.3 mg/0.3 mL IJ SOAJ injection Inject 0.3 mg into the muscle as needed for anaphylaxis.    hydroquinone 4 % cream Apply 1 application topically 2 (two) times daily as needed (dark spots).    hydrOXYzine (VISTARIL) 25 MG capsule Take 1 capsule (25 mg total) by mouth every 8 (eight) hours as needed.    ipratropium (ATROVENT) 0.03 % nasal spray INSTILL 2 SPRAYS IN EACH NOSTRIL EVERY 12 HOURS    loratadine (CLARITIN) 10 MG tablet Take 10 mg by mouth daily as needed for allergies.    MAGNESIUM PO Take 330 mg by mouth daily.    Omega-3 Fatty Acids (FISH OIL) 1000 MG CAPS Take 1,000 mg by mouth 3 (three) times a week.    ondansetron (ZOFRAN) 4 MG tablet TAKE 1 TABLET(4 MG) BY MOUTH EVERY 8 HOURS AS NEEDED FOR NAUSEA OR VOMITING    Plecanatide (TRULANCE) 3 MG TABS Take 3 mg by mouth daily.    rosuvastatin (CRESTOR) 10 MG tablet Take 1 tablet (10 mg total) by mouth daily.    sodium chloride (OCEAN) 0.65 % SOLN nasal spray Place 1 spray into both nostrils as needed for congestion.    spironolactone (ALDACTONE) 25 MG tablet Take 1 tablet (25 mg total) by mouth daily.    topiramate (TOPAMAX) 25 MG tablet Take 1 tablet (25 mg total) by mouth at bedtime.    triamcinolone cream (KENALOG) 0.1 % Apply 1 Application topically 2 (two) times daily.    valsartan (DIOVAN) 320 MG tablet TAKE 1 TABLET BY MOUTH EVERY DAY    vitamin E 400 UNIT capsule Take 400 Units by mouth 3 (three) times a week.    zinc gluconate 50 MG tablet Take 50 mg by mouth daily.    furosemide (LASIX) 20 MG tablet Take 1 tablet (20 mg total) by mouth daily as needed (Weight Gain 3lbs in 24 hrs 5lbs in 1 week). 04/13/2022: 04/13/22-Pt states she takes as needed.    No facility-administered encounter medications on file as of 07/19/2022.    Allergies:  Allergies  Allergen Reactions   Ciprofloxacin Shortness Of Breath   Latex Other (See Comments)    Burns skin   Vitamin C [Bioflavonoid  Products] Itching    Family History: Family History  Problem Relation Age of Onset   Diabetes Other    Diabetes Mother    Heart disease Mother    Heart disease Father    Diabetes Sister    Diabetes Brother    Hypertension Brother    Diabetes Daughter    Hypertension Maternal Grandmother  Stroke Maternal Grandfather    Early death Paternal Grandfather    Colon cancer Neg Hx    Liver disease Neg Hx     Social History: Social History   Tobacco Use   Smoking status: Former    Packs/day: 0.25    Years: 35.00    Total pack years: 8.75    Types: Cigarettes    Quit date: 12/31/2012    Years since quitting: 9.5   Smokeless tobacco: Never  Vaping Use   Vaping Use: Never used  Substance Use Topics   Alcohol use: Not Currently   Drug use: No   Social History   Social History Narrative   Eats all food groups.    Wears seatbelt.    Has 3 children.   Prior smoker.   Attends church.    Used to work in Char[employer] fundraiser.    Drives.   Enjoys going out with family.    Right handed   One story home    Vital Signs:  BP (!) 149/78   Pulse 81   Resp 18   Ht '5\' 6"'$  (1.676 m)   Wt 165 lb (74.8 kg)   SpO2 98%   BMI 26.63 kg/m    General Medical Exam:   General:  Well appearing, comfortable.   Eyes/ENT: see cranial nerve examination.   Neck:   No carotid bruits. Respiratory:  Clear to auscultation, good air entry bilaterally.   Cardiac:  Regular rate and rhythm, no murmur.   Extremities:  No deformities, edema, or skin discoloration.  Skin:  No rashes or lesions.  Neurological Exam: MENTAL STATUS including orientation to time, place, person, recent and remote memory, attention span and concentration, language, and fund of knowledge is normal.  Speech is not dysarthric.  CRANIAL NERVES: II:  No visual field defects.  Unremarkable fundi.   III-IV-VI: Pupils equal round and reactive to light.  Normal conjugate, extra-ocular eye movements in all directions of gaze.  No  nystagmus.  No ptosis.   V:  Normal facial sensation.    VII:  Normal facial symmetry and movements.   VIII:  Normal hearing and vestibular function.   IX-X:  Normal palatal movement.   XI:  Normal shoulder shrug and head rotation.   XII:  Normal tongue strength and range of motion, no deviation or fasciculation.  MOTOR:  No atrophy, fasciculations.  Left greater than right very mild tremor worse on intention, no head tremor.  No pronator drift. No cogwheeling   SENSORY:  Normal and symmetric perception of light touch, pinprick, vibration, and proprioception.  Romberg's sign absent.   COORDINATION/GAIT: Normal finger-to- nose-finger and heel-to-shin.  Intact rapid alternating movements bilaterally.  Able to rise from a chair without using arms.  Gait narrow based and stable. Tandem and stressed gait intact.    IMPRESSION/PLAN:  Headaches, likely due to uncontrollable hypertension. 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  Also seen, 45% narrowing of the right vertebral origin.   MRI of the brain 10/02/2021 no acute intracranial process. No evidence of acute or subacute infarct.    For abortive therapy, continue Tylenol  Limit use of pain relievers to no more than 2 days out of week to prevent risk of rebound or medication-overuse headache.  Keep headache diary  Exercise, hydration, caffeine cessation,  sleep hygiene, monitor for and avoid triggers Continue magnesium citrate '400mg'$  daily, riboflavin '400mg'$  daily, and coenzyme Q10 '100mg'$  three times daily Strong monitor of Blood pressure with current med regimen  as per  Cards and PCP Follow up in 6 months    Essential Tremors No significant changes from prior visit  Continue metoprolol    Total time spent: 25 min   Thank you for allowing me  to participate in patient's care.  If I can answer any additional questions, I would be pleased to do so.    Sincerely,   Sharene Butters, PA-C

## 2022-07-26 ENCOUNTER — Other Ambulatory Visit: Payer: Self-pay | Admitting: Gastroenterology

## 2022-07-26 ENCOUNTER — Other Ambulatory Visit: Payer: Self-pay | Admitting: Family Medicine

## 2022-07-26 DIAGNOSIS — F419 Anxiety disorder, unspecified: Secondary | ICD-10-CM

## 2022-08-03 ENCOUNTER — Telehealth: Payer: Self-pay

## 2022-08-03 NOTE — Telephone Encounter (Signed)
Patient notified and verbalized understanding. Patient had no questions or concerns at this time.  

## 2022-08-03 NOTE — Telephone Encounter (Signed)
-----   Message from Josue Hector, MD sent at 08/03/2022  1:35 PM EDT ----- Isolated PVCls continue coreg EF normal no obstructive CAD

## 2022-08-17 DIAGNOSIS — J329 Chronic sinusitis, unspecified: Secondary | ICD-10-CM | POA: Diagnosis not present

## 2022-08-17 DIAGNOSIS — J309 Allergic rhinitis, unspecified: Secondary | ICD-10-CM | POA: Diagnosis not present

## 2022-08-17 DIAGNOSIS — R0981 Nasal congestion: Secondary | ICD-10-CM | POA: Diagnosis not present

## 2022-08-18 DIAGNOSIS — J301 Allergic rhinitis due to pollen: Secondary | ICD-10-CM | POA: Diagnosis not present

## 2022-08-20 ENCOUNTER — Emergency Department (HOSPITAL_COMMUNITY)
Admission: EM | Admit: 2022-08-20 | Discharge: 2022-08-20 | Disposition: A | Discharge status: Home / Self Care | Payer: Medicare Other | Attending: Emergency Medicine | Admitting: Emergency Medicine

## 2022-08-20 ENCOUNTER — Other Ambulatory Visit: Payer: Self-pay

## 2022-08-20 ENCOUNTER — Other Ambulatory Visit: Payer: Self-pay | Admitting: Family Medicine

## 2022-08-20 ENCOUNTER — Encounter (HOSPITAL_COMMUNITY): Payer: Self-pay

## 2022-08-20 DIAGNOSIS — I959 Hypotension, unspecified: Secondary | ICD-10-CM | POA: Insufficient documentation

## 2022-08-20 DIAGNOSIS — Z91040 Latex allergy status: Secondary | ICD-10-CM | POA: Diagnosis not present

## 2022-08-20 DIAGNOSIS — Z7982 Long term (current) use of aspirin: Secondary | ICD-10-CM | POA: Insufficient documentation

## 2022-08-20 DIAGNOSIS — I10 Essential (primary) hypertension: Secondary | ICD-10-CM | POA: Diagnosis not present

## 2022-08-20 DIAGNOSIS — Z79899 Other long term (current) drug therapy: Secondary | ICD-10-CM | POA: Diagnosis not present

## 2022-08-20 LAB — CBC
HCT: 37.2 % (ref 36.0–46.0)
Hemoglobin: 11.9 g/dL — ABNORMAL LOW (ref 12.0–15.0)
MCH: 29.5 pg (ref 26.0–34.0)
MCHC: 32 g/dL (ref 30.0–36.0)
MCV: 92.3 fL (ref 80.0–100.0)
Platelets: 263 10*3/uL (ref 150–400)
RBC: 4.03 MIL/uL (ref 3.87–5.11)
RDW: 12.9 % (ref 11.5–15.5)
WBC: 6.8 10*3/uL (ref 4.0–10.5)
nRBC: 0 % (ref 0.0–0.2)

## 2022-08-20 LAB — BASIC METABOLIC PANEL
Anion gap: 7 (ref 5–15)
BUN: 16 mg/dL (ref 8–23)
CO2: 25 mmol/L (ref 22–32)
Calcium: 8.7 mg/dL — ABNORMAL LOW (ref 8.9–10.3)
Chloride: 103 mmol/L (ref 98–111)
Creatinine, Ser: 0.89 mg/dL (ref 0.44–1.00)
GFR, Estimated: >60 mL/min (ref >60)
Glucose, Bld: 108 mg/dL — ABNORMAL HIGH (ref 70–99)
Potassium: 4 mmol/L (ref 3.5–5.1)
Sodium: 135 mmol/L (ref 135–145)

## 2022-08-20 NOTE — Discharge Instructions (Signed)
Return to the ED with any new or worsening signs or symptoms As we discussed, please continue monitoring your blood pressure at home.  Please take your blood pressure once in the morning prior to taking her medicine and then about 30 minutes after taking her medication.  Please also take your blood pressure at night before bed.  Please utilize blood pressure record sheet attached to this form. Please continue taking potassium as prescribed by your PCP. Please read attached guide concerning hypotension

## 2022-08-20 NOTE — ED Provider Notes (Signed)
[place] EMERGENCY DEPARTMENT AT [facility] Provider Note   CSN: [id number] Arrival date & time: 08/20/22  1221     History  Chief Complaint  Patient presents with   Hypotension    [name] is a 67 y.o. female with medical history of anxiety, headache, hypertension.  Patient presents to ED for evaluation of low blood pressure. Patient reports she has been feeling "off" for the last couple of days.  Patient unable to expand on exactly what she means, just states that she feels as if she is not "100%".  Patient reports that she took her blood pressure this morning and it was 80 systolic. Patient reports history of hypertension, states she is compliant on medications, denies taking any extra dosages.  Patient denies syncope, lightheadedness.  Patient states that she drink Gatorade, salt water and then came in for evaluation after talking to her daughter who is an Charity fundraiser.  Denies any recent fevers, nausea or vomiting.  Patient reports that sometimes her potassium will be low however she has been put on oral potassium replacement by her PCP and is compliant.  Patient denies chest pain or shortness of breath.  Patient denies dysuria, flank pain, fevers.  HPI     Home Medications Prior to Admission medications   Medication Sig Start Date End Date Taking? Authorizing Provider  acetaminophen (TYLENOL) 500 MG tablet Take 500 mg by mouth every 6 (six) hours as needed for moderate pain or headache.    [provider]  alendronate (FOSAMAX) 70 MG tablet Take 1 tablet (70 mg total) by mouth every 7 (seven) days. Take with a full glass of water on an empty stomach. 06/11/22   [name], MD  ALPRAZolam Prudy Feeler) 0.5 MG tablet TAKE 1 TABLET(0.5 MG) BY MOUTH TWICE DAILY AS NEEDED FOR ANXIETY 07/26/22   [name], MD  amLODipine (NORVASC) 10 MG tablet TAKE 1 TABLET(10 MG) BY MOUTH AT BEDTIME 09/23/21   [name], MD  aspirin EC 81 MG tablet Take 81 mg by mouth  at bedtime.     [provider]  Azelastine-Fluticasone (DYMISTA) 137-50 MCG/ACT SUSP Place 2 sprays into the nose daily. 12/10/21   [name], MD  Calcium 500 MG tablet Take 500 mg by mouth in the morning and at bedtime.    [provider]  carvedilol (COREG) 3.125 MG tablet TAKE 1 TABLET(3.125 MG) BY MOUTH TWICE DAILY 05/14/22   Gaston Islam., NP  cholecalciferol (VITAMIN D3) 25 MCG (1000 UT) tablet Take 2,000 Units by mouth daily.    [provider]  cloNIDine (CATAPRES) 0.1 MG tablet Take 0.1 mg by mouth See admin instructions. Take 1/2 tablet daily as needed for blood pressure    [provider]  Coenzyme Q10 (COQ10) 100 MG CAPS Take 100 mg by mouth daily.    [provider]  CRANBERRY PO Take 1 each by mouth 2 (two) times a week. Chewables    [provider]  Cyanocobalamin (B-12) 2500 MCG TABS Take 2,500 mcg by mouth daily.    [provider]  dexlansoprazole (DEXILANT) 60 MG capsule Take 1 capsule (60 mg total) by mouth daily. 05/18/22   [name], [name] Friends, MD  EPINEPHrine 0.3 mg/0.3 mL IJ SOAJ injection Inject 0.3 mg into the muscle as needed for anaphylaxis. 03/12/22   [name], MD  furosemide (LASIX) 20 MG tablet Take 1 tablet (20 mg total) by mouth daily as needed (Weight Gain  3lbs in 24 hrs 5lbs in 1 week). 01/22/22 07/05/22  Gaston Islam., NP  hydroquinone 4 % cream Apply 1 application topically 2 (two) times daily as needed (dark spots). 01/05/21   [provider]  hydrOXYzine (VISTARIL) 25 MG capsule Take 1 capsule (25 mg total) by mouth every 8 (eight) hours as needed. 10/29/21   [name], MD  ipratropium (ATROVENT) 0.03 % nasal spray INSTILL 2 SPRAYS IN EACH NOSTRIL EVERY 12 HOURS 04/17/21   [name], MD  loratadine (CLARITIN) 10 MG tablet Take 10 mg by mouth daily as needed for allergies.    [provider]  MAGNESIUM PO Take 330 mg by mouth daily.    [provider]  Omega-3 Fatty Acids (FISH OIL) 1000 MG CAPS Take 1,000 mg by mouth 3 (three) times a week.    [provider]  ondansetron (ZOFRAN) 4 MG tablet TAKE 1 TABLET(4 MG) BY MOUTH EVERY 8 HOURS AS NEEDED FOR NAUSEA OR VOMITING 03/06/21   [name], PA-C  Plecanatide (TRULANCE) 3 MG TABS Take 3 mg by mouth daily. 05/18/22   [name], [name] Friends, MD  rosuvastatin (CRESTOR) 10 MG tablet Take 1 tablet (10 mg total) by mouth daily. 01/06/22   [name], MD  sodium chloride (OCEAN) 0.65 % SOLN nasal spray Place 1 spray into both nostrils as needed for congestion.    [provider]  spironolactone (ALDACTONE) 25 MG tablet Take 1 tablet (25 mg total) by mouth daily. 03/12/22   [name], MD  topiramate (TOPAMAX) 25 MG tablet Take 1 tablet (25 mg total) by mouth at bedtime. 01/14/22   Marcos Eke, PA-C  triamcinolone cream (KENALOG) 0.1 % Apply 1 Application topically 2 (two) times daily. 10/29/21   [name], MD  valsartan (DIOVAN) 320 MG tablet TAKE 1 TABLET BY MOUTH EVERY DAY 08/20/22   [name], MD  vitamin E 400 UNIT capsule Take 400 Units by mouth 3 (three) times a week.    [provider]  zinc gluconate 50 MG tablet Take 50 mg by mouth daily.    [provider]      Allergies    Ciprofloxacin, Latex, and Vitamin c [bioflavonoid products]    Review of Systems   Review of Systems  All other systems reviewed and are negative.   Physical Exam Updated Vital Signs BP 130/62   Pulse 61   Temp 98 F (36.7 C) (Oral)   Resp 16   Ht  (1.676 m)   Wt 73.5 kg   SpO2 99%   BMI 26.15 kg/m  Physical Exam Vitals and nursing note reviewed.  Constitutional:      General: She is not in acute distress.    Appearance: Normal appearance. She is not ill-appearing, toxic-appearing or diaphoretic.  HENT:     Head: Normocephalic and atraumatic.     Nose: Nose normal.     Mouth/Throat:     Mouth: Mucous membranes are moist.      Pharynx: Oropharynx is clear.  Eyes:     Extraocular Movements: Extraocular movements intact.     Conjunctiva/sclera: Conjunctivae normal.     Pupils: Pupils are equal, round, and reactive to light.  Cardiovascular:     Rate and Rhythm: Normal rate and regular rhythm.  Pulmonary:     Effort: Pulmonary effort is normal.     Breath sounds: Normal breath sounds. No wheezing.  Abdominal:  General: Abdomen is flat. Bowel sounds are normal.     Palpations: Abdomen is soft.     Tenderness: There is no abdominal tenderness.  Musculoskeletal:     Cervical back: Normal range of motion and neck supple. No tenderness.  Skin:    General: Skin is warm and dry.     Capillary Refill: Capillary refill takes less than 2 seconds.  Neurological:     General: No focal deficit present.     Mental Status: She is alert and oriented to person, place, and time.     GCS: GCS eye subscore is 4. GCS verbal subscore is 5. GCS motor subscore is 6.     Cranial Nerves: Cranial nerves 2-12 are intact. No cranial nerve deficit.     Sensory: Sensation is intact. No sensory deficit.     Motor: Motor function is intact. No weakness.     Coordination: Coordination is intact. Heel to [facility] Test normal.     ED Results / Procedures / Treatments   Labs (all labs ordered are listed, but only abnormal results are displayed) Labs Reviewed  CBC - Abnormal; Notable for the following components:      Result Value   Hemoglobin 11.9 (*)    All other components within normal limits  BASIC METABOLIC PANEL - Abnormal; Notable for the following components:   Glucose, Bld 108 (*)    Calcium 8.7 (*)    All other components within normal limits    EKG None  Radiology No results found.  Procedures Procedures   Medications Ordered in ED Medications - No data to display  ED Course/ Medical Decision Making/ A&P  Medical Decision Making Amount and/or Complexity of Data Reviewed Labs: ordered.   67 year old female  presents to ED for evaluation.  Please see HPI for further details.  On examination the patient is afebrile and nontachycardic.  Her lung sounds are clear bilaterally, she is not hypoxic.  Her abdomen is soft and compressible throughout.  Her neurological examination is without focal neurodeficits.  The patient is nontoxic in appearance.  Patient lab work collected and is unremarkable.  No leukocytosis, anemia.  BMP without electrolyte derangement.  Patient blood pressure here 130/62.  Patient reports that she feels better at this time.  Patient encouraged to continue monitoring blood pressure at home.  The patient was encouraged to begin trending her blood pressure measurements in the morning and at night.  The patient was encouraged to take these measurements to her PCP to evaluate her blood pressure medication dosages.  Patient encouraged to continue with current regimen.  Patient provided return precautions and she voiced understanding.  Patient had all her questions answered to her satisfaction.  Patient is stable for discharge.   Final Clinical Impression(s) / ED Diagnoses Final diagnoses:  Hypotension, unspecified hypotension type    Rx / DC Orders ED Discharge Orders     None         Clent Ridges 08/20/22 1552    Jacalyn Lefevre, MD 08/21/22 [phone]

## 2022-08-20 NOTE — Telephone Encounter (Signed)
Due to a computer glitch the system does not pick up the correct office visits.   Pt had a visit on 06/11/2022 and 06/21/2022 which is not being picked up by the protocol. Labs in date and has a valid 6 month encounter.  Requested Prescriptions  Pending Prescriptions Disp Refills   valsartan (DIOVAN) 320 MG tablet [Pharmacy Med Name: VALSARTAN 320 MG TABLET] 90 tablet 0    Sig: TAKE 1 TABLET BY MOUTH EVERY DAY     Cardiovascular:  Angiotensin Receptor Blockers Failed - 08/20/2022  2:19 AM      Failed - Last BP in normal range    BP Readings from Last 1 Encounters:  08/20/22 (!) 154/64         Failed - Valid encounter within last 6 months    Recent Outpatient Visits           10 months ago Unilateral headache   [facility] Tanya Nones, Priscille Heidelberg, MD   11 months ago Lumbar radiculopathy, chronic   [facility] Pickard, Priscille Heidelberg, MD   1 year ago Viral upper respiratory tract infection   [facility] Tanya Nones, Priscille Heidelberg, MD   1 year ago Abnormal urine color   [facility] [name], MD   1 year ago Chest wall pain   [facility] Tanya Nones, Priscille Heidelberg, MD              Passed - Cr in normal range and within 180 days    Creat  Date Value Ref Range Status  06/11/2022 0.92 0.50 - 1.05 mg/dL Final   Creatinine, Urine  Date Value Ref Range Status  01/20/2021 47 20 - 275 mg/dL Final         Passed - K in normal range and within 180 days    Potassium  Date Value Ref Range Status  06/11/2022 4.5 3.5 - 5.3 mmol/L Final  11/16/2012 4.1 mmol/L Final         Passed - Patient is not pregnant

## 2022-08-20 NOTE — ED Provider Triage Note (Signed)
Emergency Medicine Provider Triage Evaluation Note  [name] , a 67 y.o. female  was evaluated in triage.  Pt complains of low blood pressure.  Patient reports she has been feeling "off" for the last couple of days.  Patient reports that she took her blood pressure this morning and it was 80 systolic.  Patient denies history of hypertension.  Patient reports history of hypertension, states she is compliant on medications, denies take any extra dosages.  Patient denies syncope, lightheadedness.  Patient states that she drink Gatorade, salt water and then came in for evaluation.  Denies any recent fevers, nausea or vomiting.  Patient reports that sometimes her potassium will be low however she has been put on oral potassium replacement by her PCP.  Review of Systems  Positive:  Negative:   Physical Exam  BP (!) 154/64 (BP Location: Right Arm)   Pulse 82   Temp 98 F (36.7 C) (Oral)   Resp 16   Ht 5\' 6"  (1.676 m)   Wt 73.5 kg   SpO2 97%   BMI 26.15 kg/m  Gen:   Awake, no distress   Resp:  Normal effort  MSK:   Moves extremities without difficulty  Other:  Normal neuro examination.  Clear lung sounds.  Medical Decision Making  Medically screening exam initiated at 2:02 PM.  Appropriate orders placed.  [name] was informed that the remainder of the evaluation will be completed by another provider, this initial triage assessment does not replace that evaluation, and the importance of remaining in the ED until their evaluation is complete.     Al Decant, PA-C 08/20/22 1403

## 2022-08-20 NOTE — ED Triage Notes (Addendum)
Pt reports she was feeling a little bad today and she checked her BP and it was 88/48.  Pt reports she drank salt water and gatorade to bring up her BP and decided to come to the ER to get checked out.  Pt is not hypotensive in triage.

## 2022-08-21 ENCOUNTER — Other Ambulatory Visit: Payer: Self-pay | Admitting: Family Medicine

## 2022-08-23 DIAGNOSIS — J301 Allergic rhinitis due to pollen: Secondary | ICD-10-CM | POA: Diagnosis not present

## 2022-08-23 NOTE — Telephone Encounter (Signed)
Requested Prescriptions  Pending Prescriptions Disp Refills   hydrOXYzine (VISTARIL) 25 MG capsule [Pharmacy Med Name: HYDROXYZINE PAMOATE 25MG  CAPSULES] 90 capsule 0    Sig: TAKE 1 CAPSULE(25 MG) BY MOUTH EVERY 8 HOURS AS NEEDED     Ear, Nose, and Throat:  Antihistamines 2 Passed - 08/21/2022  7:47 AM      Passed - Cr in normal range and within 360 days    Creat  Date Value Ref Range Status  06/11/2022 0.92 0.50 - 1.05 mg/dL Final   Creatinine, Ser  Date Value Ref Range Status  08/20/2022 0.89 0.44 - 1.00 mg/dL Final   Creatinine, Urine  Date Value Ref Range Status  01/20/2021 47 20 - 275 mg/dL Final         Passed - Valid encounter within last 12 months    Recent Outpatient Visits           10 months ago Unilateral headache   [facility] [name], MD   11 months ago Lumbar radiculopathy, chronic   [facility] Medicine Pickard, Priscille Heidelberg, MD   1 year ago Viral upper respiratory tract infection   [facility] Medicine Tanya Nones Priscille Heidelberg, MD   1 year ago Abnormal urine color   [facility] Pickard, Priscille Heidelberg, MD   1 year ago Chest wall pain   [facility] Pickard, Priscille Heidelberg, MD

## 2022-08-30 DIAGNOSIS — J301 Allergic rhinitis due to pollen: Secondary | ICD-10-CM | POA: Diagnosis not present

## 2022-08-31 DIAGNOSIS — J301 Allergic rhinitis due to pollen: Secondary | ICD-10-CM | POA: Diagnosis not present

## 2022-09-01 ENCOUNTER — Ambulatory Visit (HOSPITAL_COMMUNITY)
Admission: RE | Admit: 2022-09-01 | Discharge: 2022-09-01 | Disposition: A | Discharge status: Home / Self Care | Payer: Medicare Other | Source: Ambulatory Visit | Attending: Vascular Surgery | Admitting: Vascular Surgery

## 2022-09-01 ENCOUNTER — Ambulatory Visit (INDEPENDENT_AMBULATORY_CARE_PROVIDER_SITE_OTHER): Payer: Medicare Other | Admitting: Physician Assistant

## 2022-09-01 VITALS — BP 150/80 | HR 73 | Temp 97.9°F | Wt 165.0 lb

## 2022-09-01 DIAGNOSIS — I739 Peripheral vascular disease, unspecified: Secondary | ICD-10-CM

## 2022-09-01 DIAGNOSIS — I6523 Occlusion and stenosis of bilateral carotid arteries: Secondary | ICD-10-CM | POA: Insufficient documentation

## 2022-09-01 NOTE — Progress Notes (Unsigned)
Office Note   History of Present Illness   [name] is a 67 y.o. (1955-09-05) female who presents for surveillance of carotid artery stenosis.  She has right ICA stenosis measured 60 to 79% and left ICA stenosis measured 1 to 39%.  She has no prior history of stroke or TIA.  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  It is typically felt that her migraines are due to episodes of uncontrolled hypertension.  She also has chronic bilateral foot numbness.  They have also been previous times where she has had hypertensive episodes with associated "word jumbling".   She returns today for follow up. She denies any recent diagnosis of CVA or TIA. She denies any sudden visual changes or facial droop.  She still having a difficult time with her blood pressure control, with intermittent episodes of hypotension or hypertension.  She still gets "pulsating" bitemporal headaches usually accompanied with left arm weakness.  Her left arm does not get weak when she does not have a headache.  She still has unchanged bilateral foot numbness.   She does express concern for more frequent episodes of "jumbling her words" 4 times and where she suddenly does not know where she is/what she is doing and then her memory comes back to her.  She states that these episodes have happened for "a while now" but have seemed to increase and occur somewhere between 2-3 times a week over the past few months.  Current Outpatient Medications  Medication Sig Dispense Refill   acetaminophen (TYLENOL) 500 MG tablet Take 500 mg by mouth every 6 (six) hours as needed for moderate pain or headache.     alendronate (FOSAMAX) 70 MG tablet Take 1 tablet (70 mg total) by mouth every 7 (seven) days. Take with a full glass of water on an empty stomach. 4 tablet 11   ALPRAZolam (XANAX) 0.5 MG tablet TAKE 1 TABLET(0.5 MG) BY MOUTH TWICE DAILY AS NEEDED FOR  ANXIETY 180 tablet 0   amLODipine (NORVASC) 10 MG tablet TAKE 1 TABLET(10 MG) BY MOUTH AT BEDTIME 90 tablet 3   aspirin EC 81 MG tablet Take 81 mg by mouth at bedtime.      Azelastine-Fluticasone (DYMISTA) 137-50 MCG/ACT SUSP Place 2 sprays into the nose daily. 23 g 11   Calcium 500 MG tablet Take 500 mg by mouth in the morning and at bedtime.     carvedilol (COREG) 3.125 MG tablet TAKE 1 TABLET(3.125 MG) BY MOUTH TWICE DAILY 180 tablet 3   cholecalciferol (VITAMIN D3) 25 MCG (1000 UT) tablet Take 2,000 Units by mouth daily.     cloNIDine (CATAPRES) 0.1 MG tablet Take 0.1 mg by mouth See admin instructions. Take 1/2 tablet daily as needed for blood pressure     Coenzyme Q10 (COQ10) 100 MG CAPS Take 100 mg by mouth daily.     CRANBERRY PO Take 1 each by mouth 2 (two) times a week. Chewables     Cyanocobalamin (B-12) 2500 MCG TABS Take 2,500 mcg by mouth daily.     dexlansoprazole (DEXILANT) 60 MG capsule Take 1 capsule (60 mg total) by mouth daily. 30 capsule 11   EPINEPHrine 0.3 mg/0.3 mL IJ SOAJ injection Inject 0.3 mg into the muscle as needed for anaphylaxis. 1 each 1   hydroquinone 4 % cream Apply 1 application topically 2 (two) times daily as needed (dark spots).     hydrOXYzine (VISTARIL) 25 MG capsule TAKE  1 CAPSULE(25 MG) BY MOUTH EVERY 8 HOURS AS NEEDED 90 capsule 0   ipratropium (ATROVENT) 0.03 % nasal spray INSTILL 2 SPRAYS IN EACH NOSTRIL EVERY 12 HOURS 30 mL 12   loratadine (CLARITIN) 10 MG tablet Take 10 mg by mouth daily as needed for allergies.     MAGNESIUM PO Take 330 mg by mouth daily.     Omega-3 Fatty Acids (FISH OIL) 1000 MG CAPS Take 1,000 mg by mouth 3 (three) times a week.     ondansetron (ZOFRAN) 4 MG tablet TAKE 1 TABLET(4 MG) BY MOUTH EVERY 8 HOURS AS NEEDED FOR NAUSEA OR VOMITING 30 tablet 0   Plecanatide (TRULANCE) 3 MG TABS Take 3 mg by mouth daily. 30 tablet 11   rosuvastatin (CRESTOR) 10 MG tablet Take 1 tablet (10 mg total) by mouth daily. 90 tablet 3   sodium  chloride (OCEAN) 0.65 % SOLN nasal spray Place 1 spray into both nostrils as needed for congestion.     spironolactone (ALDACTONE) 25 MG tablet Take 1 tablet (25 mg total) by mouth daily. 90 tablet 3   topiramate (TOPAMAX) 25 MG tablet Take 1 tablet (25 mg total) by mouth at bedtime. 30 tablet 11   triamcinolone cream (KENALOG) 0.1 % Apply 1 Application topically 2 (two) times daily. 30 g 0   valsartan (DIOVAN) 320 MG tablet TAKE 1 TABLET BY MOUTH EVERY DAY 90 tablet 0   vitamin E 400 UNIT capsule Take 400 Units by mouth 3 (three) times a week.     zinc gluconate 50 MG tablet Take 50 mg by mouth daily.     furosemide (LASIX) 20 MG tablet Take 1 tablet (20 mg total) by mouth daily as needed (Weight Gain 3lbs in 24 hrs 5lbs in 1 week). 30 tablet 1   No current facility-administered medications for this visit.    REVIEW OF SYSTEMS (negative unless checked):   Cardiac:   Chest pain or chest pressure?  Shortness of breath upon activity?  Shortness of breath when lying flat?  Irregular heart rhythm?  Vascular:   Pain in calf, thigh, or hip brought on by walking?  Pain in feet at night that wakes you up from your sleep?  Blood clot in your veins?  Leg swelling?  Pulmonary:   Oxygen at home?  Productive cough?  Wheezing?  Neurologic:   Sudden weakness in arms or legs?  Sudden numbness in arms or legs?  Sudden onset of difficult speaking or slurred speech?  Temporary loss of vision in one eye?  Problems with dizziness?  Gastrointestinal:   Blood in stool?  Vomited blood?  Genitourinary:   Burning when urinating?  Blood in urine?  Psychiatric:   Major depression  Hematologic:   Bleeding problems?  Problems with blood clotting?  Dermatologic:   Rashes or ulcers?  Constitutional:   Fever or chills?  Ear/Nose/Throat:   Change in hearing?  Nose bleeds?  Sore throat?  Musculoskeletal:   Back pain?  Joint  pain?  Muscle pain?   Physical Examination   Vitals:   09/01/22 1353 09/01/22 1355  BP: (!) 148/74 (!) 150/80  Pulse: 73   Temp: 97.9 F (36.6 C)   TempSrc: Temporal   SpO2: 100%   Weight: 165 lb (74.8 kg)    Body mass index is 26.63 kg/m.  General:  WDWN in NAD; vital signs documented above Gait: Not observed HENT: WNL, normocephalic Pulmonary: normal non-labored breathing  Cardiac: regular, with right carotid bruit Abdomen: soft, NT,  no masses Skin: without rashes Vascular Exam/Pulses: palpable radial pulses  Extremities: without ischemic changes, without gangrene , without cellulitis; without open wounds;  Musculoskeletal: no muscle wasting or atrophy  Neurologic: A&O X 3;  No focal weakness or paresthesias are detected Psychiatric:  The pt has Normal affect.  Non-Invasive Vascular Imaging   B Carotid Duplex (09/01/2022):  R ICA stenosis:  60-79% R VA:  patent and antegrade L ICA stenosis:  1-39% L VA:  patent and antegrade   Medical Decision Making   JAHNESSA VANDUYN is a 67 y.o. female who presents for surveillance of carotid artery stenosis  Based on the patient's vascular studies, her carotid artery stenosis is unchanged.  She has right ICA stenosis 60 to 79% and left ICA stenosis 1 to 39% She denies any episodes of sudden visual changes or facial droop.  She does have a history of bitemporal headaches accompanied with left arm weakness/heaviness, which is being followed by her neurologist.  She expresses concern for more frequent episodes of "word jumbling" and sudden confusion of where she is/what she is doing. She believes these episodes have happened for years but over the past few months they have become more frequent, possibly 2-3 weekly. I have discussed with the patient I do not think her symptoms are due to her carotid disease. There is a possibility she is having seizures or some other neurological event. I have encouraged her to discuss these issues  with her neurologist. She is otherwise neurologically intact. She can follow up with our office in 6 months with carotid duplex   Loel Dubonnet PA-C [facility] Office: [phone]  Clinic MD: Randie Heinz

## 2022-09-06 DIAGNOSIS — J301 Allergic rhinitis due to pollen: Secondary | ICD-10-CM | POA: Diagnosis not present

## 2022-09-08 ENCOUNTER — Other Ambulatory Visit: Payer: Self-pay

## 2022-09-08 DIAGNOSIS — I6523 Occlusion and stenosis of bilateral carotid arteries: Secondary | ICD-10-CM

## 2022-09-13 DIAGNOSIS — J301 Allergic rhinitis due to pollen: Secondary | ICD-10-CM | POA: Diagnosis not present

## 2022-09-15 ENCOUNTER — Other Ambulatory Visit: Payer: Self-pay | Admitting: Family Medicine

## 2022-09-15 DIAGNOSIS — I10 Essential (primary) hypertension: Secondary | ICD-10-CM

## 2022-09-15 NOTE — Telephone Encounter (Signed)
Requested Prescriptions  Pending Prescriptions Disp Refills   amLODipine (NORVASC) 10 MG tablet [Pharmacy Med Name: AMLODIPINE BESYLATE 10MG  TABLETS] 90 tablet 0    Sig: TAKE 1 TABLET(10 MG) BY MOUTH AT BEDTIME     Cardiovascular: Calcium Channel Blockers 2 Failed - 09/15/2022  8:05 AM      Failed - Last BP in normal range    BP Readings from Last 1 Encounters:  09/01/22 (!) 150/80         Failed - Valid encounter within last 6 months    Recent Outpatient Visits           11 months ago Unilateral headache   [facility] Medicine [name], MD   1 year ago Lumbar radiculopathy, chronic   [facility] Medicine Tanya Nones, Priscille Heidelberg, MD   1 year ago Viral upper respiratory tract infection   [facility] Tanya Nones Priscille Heidelberg, MD   1 year ago Abnormal urine color   [facility] Tanya Nones, Priscille Heidelberg, MD   1 year ago Chest wall pain   [facility] Medicine [name], MD              Passed - Last Heart Rate in normal range    Pulse Readings from Last 1 Encounters:  09/01/22 73

## 2022-09-20 DIAGNOSIS — J301 Allergic rhinitis due to pollen: Secondary | ICD-10-CM | POA: Diagnosis not present

## 2022-09-21 ENCOUNTER — Other Ambulatory Visit: Payer: Self-pay | Admitting: Family Medicine

## 2022-09-21 NOTE — Telephone Encounter (Signed)
Requested Prescriptions  Pending Prescriptions Disp Refills   hydrOXYzine (VISTARIL) 25 MG capsule [Pharmacy Med Name: HYDROXYZINE PAMOATE 25MG  CAPSULES] 90 capsule 2    Sig: TAKE 1 CAPSULE(25 MG) BY MOUTH EVERY 8 HOURS AS NEEDED     Ear, Nose, and Throat:  Antihistamines 2 Failed - 09/21/2022  7:23 AM      Failed - Valid encounter within last 12 months    Recent Outpatient Visits           11 months ago Unilateral headache   [facility] Pickard, Priscille Heidelberg, MD   1 year ago Lumbar radiculopathy, chronic   [facility] Pickard, Priscille Heidelberg, MD   1 year ago Viral upper respiratory tract infection   [facility] Tanya Nones, Priscille Heidelberg, MD   1 year ago Abnormal urine color   [facility] Tanya Nones, Priscille Heidelberg, MD   1 year ago Chest wall pain   [facility] Medicine Tanya Nones, Priscille Heidelberg, MD              Passed - Cr in normal range and within 360 days    Creat  Date Value Ref Range Status  06/11/2022 0.92 0.50 - 1.05 mg/dL Final   Creatinine, Ser  Date Value Ref Range Status  08/20/2022 0.89 0.44 - 1.00 mg/dL Final   Creatinine, Urine  Date Value Ref Range Status  01/20/2021 47 20 - 275 mg/dL Final

## 2022-09-27 DIAGNOSIS — J301 Allergic rhinitis due to pollen: Secondary | ICD-10-CM | POA: Diagnosis not present

## 2022-10-11 DIAGNOSIS — J301 Allergic rhinitis due to pollen: Secondary | ICD-10-CM | POA: Diagnosis not present

## 2022-10-18 DIAGNOSIS — J301 Allergic rhinitis due to pollen: Secondary | ICD-10-CM | POA: Diagnosis not present

## 2022-10-25 DIAGNOSIS — J301 Allergic rhinitis due to pollen: Secondary | ICD-10-CM | POA: Diagnosis not present

## 2022-11-01 DIAGNOSIS — J301 Allergic rhinitis due to pollen: Secondary | ICD-10-CM | POA: Diagnosis not present

## 2022-11-08 DIAGNOSIS — J301 Allergic rhinitis due to pollen: Secondary | ICD-10-CM | POA: Diagnosis not present

## 2022-11-09 ENCOUNTER — Ambulatory Visit: Payer: Self-pay

## 2022-11-09 ENCOUNTER — Emergency Department (HOSPITAL_COMMUNITY): Payer: Medicare Other

## 2022-11-09 ENCOUNTER — Emergency Department (HOSPITAL_COMMUNITY)
Admission: EM | Admit: 2022-11-09 | Discharge: 2022-11-09 | Disposition: A | Discharge status: Home / Self Care | Payer: Medicare Other | Attending: Emergency Medicine | Admitting: Emergency Medicine

## 2022-11-09 ENCOUNTER — Other Ambulatory Visit: Payer: Self-pay

## 2022-11-09 ENCOUNTER — Telehealth: Payer: Self-pay

## 2022-11-09 DIAGNOSIS — R42 Dizziness and giddiness: Secondary | ICD-10-CM | POA: Diagnosis not present

## 2022-11-09 DIAGNOSIS — I672 Cerebral atherosclerosis: Secondary | ICD-10-CM | POA: Diagnosis not present

## 2022-11-09 DIAGNOSIS — Z91040 Latex allergy status: Secondary | ICD-10-CM | POA: Insufficient documentation

## 2022-11-09 DIAGNOSIS — Z79899 Other long term (current) drug therapy: Secondary | ICD-10-CM | POA: Diagnosis not present

## 2022-11-09 DIAGNOSIS — R519 Headache, unspecified: Secondary | ICD-10-CM | POA: Diagnosis not present

## 2022-11-09 DIAGNOSIS — Z7982 Long term (current) use of aspirin: Secondary | ICD-10-CM | POA: Diagnosis not present

## 2022-11-09 DIAGNOSIS — I10 Essential (primary) hypertension: Secondary | ICD-10-CM | POA: Diagnosis not present

## 2022-11-09 LAB — CBC WITH DIFFERENTIAL/PLATELET
Abs Immature Granulocytes: 0.01 10*3/uL (ref 0.00–0.07)
Basophils Absolute: 0 10*3/uL (ref 0.0–0.1)
Basophils Relative: 0 %
Eosinophils Absolute: 0.3 10*3/uL (ref 0.0–0.5)
Eosinophils Relative: 4 %
HCT: 39.1 % (ref 36.0–46.0)
Hemoglobin: 12.9 g/dL (ref 12.0–15.0)
Immature Granulocytes: 0 %
Lymphocytes Relative: 37 %
Lymphs Abs: 3 10*3/uL (ref 0.7–4.0)
MCH: 30.1 pg (ref 26.0–34.0)
MCHC: 33 g/dL (ref 30.0–36.0)
MCV: 91.1 fL (ref 80.0–100.0)
Monocytes Absolute: 0.5 10*3/uL (ref 0.1–1.0)
Monocytes Relative: 6 %
Neutro Abs: 4.3 10*3/uL (ref 1.7–7.7)
Neutrophils Relative %: 53 %
Platelets: 270 10*3/uL (ref 150–400)
RBC: 4.29 MIL/uL (ref 3.87–5.11)
RDW: 12.8 % (ref 11.5–15.5)
WBC: 8.1 10*3/uL (ref 4.0–10.5)
nRBC: 0 % (ref 0.0–0.2)

## 2022-11-09 LAB — BASIC METABOLIC PANEL
Anion gap: 9 (ref 5–15)
BUN: 10 mg/dL (ref 8–23)
CO2: 28 mmol/L (ref 22–32)
Calcium: 9.9 mg/dL (ref 8.9–10.3)
Chloride: 105 mmol/L (ref 98–111)
Creatinine, Ser: 0.95 mg/dL (ref 0.44–1.00)
GFR, Estimated: >60 mL/min (ref >60)
Glucose, Bld: 115 mg/dL — ABNORMAL HIGH (ref 70–99)
Potassium: 4.2 mmol/L (ref 3.5–5.1)
Sodium: 142 mmol/L (ref 135–145)

## 2022-11-09 NOTE — Transitions of Care (Post Inpatient/ED Visit) (Signed)
   11/09/2022  Name: PATTSY SMEATON MRN: [id number] DOB: 1955/12/22  Today's TOC FU Call Status: Today's TOC FU Call Status:: Unsuccessul Call (1st Attempt) Unsuccessful Call (1st Attempt) Date: 11/09/22  Attempted to reach the patient regarding the most recent Inpatient/ED visit.  Follow Up Plan: Additional outreach attempts will be made to reach the patient to complete the Transitions of Care (Post Inpatient/ED visit) call.   Signature  Abby Aamina Skiff, CMA  [facility] AWV Team Direct Dial: [phone]

## 2022-11-09 NOTE — Discharge Instructions (Signed)
You are seen today for high blood pressure and headache.  Your workup today is reassuring including CT imaging.  Continue your medications as prescribed.  Follow-up with your primary doctor for adjustments of medication.  Note that if you are taking clonidine, you need to take it regularly as you can get rebound high blood pressure with this medication.

## 2022-11-09 NOTE — ED Provider Notes (Signed)
[place] EMERGENCY DEPARTMENT AT [facility] Provider Note   CSN: [id number] Arrival date & time: 11/09/22  0114     History  Chief Complaint  Patient presents with   Headache    [name] is a 67 y.o. female.  HPI     This is a 67 year old female who presents with headache and concern for high blood pressure.  Patient reports that her blood pressures have been trending upwards.  Just prior to arrival she had a blood pressure of 197/115.  She reports that she gets a pulsating in her face and her head and tingling in her hands.  She states that this normally indicates that her blood pressure is elevated.  Patient took her blood pressure medication as directed yesterday.  She has clonidine prescribed to her; however has not taken it in several weeks.  She denies any nausea or vomiting.  Denies any weakness, numbness, tingling, focal deficits.  Denies chest pain or shortness of breath.  Home Medications Prior to Admission medications   Medication Sig Start Date End Date Taking? Authorizing Provider  acetaminophen (TYLENOL) 500 MG tablet Take 500 mg by mouth every 6 (six) hours as needed for moderate pain or headache.    [provider]  alendronate (FOSAMAX) 70 MG tablet Take 1 tablet (70 mg total) by mouth every 7 (seven) days. Take with a full glass of water on an empty stomach. 06/11/22   [name], MD  ALPRAZolam Prudy Feeler) 0.5 MG tablet TAKE 1 TABLET(0.5 MG) BY MOUTH TWICE DAILY AS NEEDED FOR ANXIETY 07/26/22   [name], MD  amLODipine (NORVASC) 10 MG tablet TAKE 1 TABLET(10 MG) BY MOUTH AT BEDTIME 09/15/22   [name], MD  aspirin EC 81 MG tablet Take 81 mg by mouth at bedtime.     [provider]  Azelastine-Fluticasone (DYMISTA) 137-50 MCG/ACT SUSP Place 2 sprays into the nose daily. 12/10/21   [name], MD  Calcium 500 MG tablet Take 500 mg by mouth in the morning and at bedtime.    [provider]   carvedilol (COREG) 3.125 MG tablet TAKE 1 TABLET(3.125 MG) BY MOUTH TWICE DAILY 05/14/22   Gaston Islam., NP  cholecalciferol (VITAMIN D3) 25 MCG (1000 UT) tablet Take 2,000 Units by mouth daily.    [provider]  cloNIDine (CATAPRES) 0.1 MG tablet Take 0.1 mg by mouth See admin instructions. Take 1/2 tablet daily as needed for blood pressure    [provider]  Coenzyme Q10 (COQ10) 100 MG CAPS Take 100 mg by mouth daily.    [provider]  CRANBERRY PO Take 1 each by mouth 2 (two) times a week. Chewables    [provider]  Cyanocobalamin (B-12) 2500 MCG TABS Take 2,500 mcg by mouth daily.    [provider]  dexlansoprazole (DEXILANT) 60 MG capsule Take 1 capsule (60 mg total) by mouth daily. 05/18/22   [name], [name] Friends, MD  EPINEPHrine 0.3 mg/0.3 mL IJ SOAJ injection Inject 0.3 mg into the muscle as needed for anaphylaxis. 03/12/22   [name], MD  furosemide (LASIX) 20 MG tablet Take 1 tablet (20 mg total) by mouth daily as needed (Weight Gain 3lbs in 24 hrs 5lbs in 1 week). 01/22/22 07/05/22  Gaston Islam., NP  hydroquinone 4 % cream Apply 1 application topically 2 (two) times daily as needed (dark spots). 01/05/21   [provider]  hydrOXYzine (VISTARIL) 25  MG capsule TAKE 1 CAPSULE(25 MG) BY MOUTH EVERY 8 HOURS AS NEEDED 09/21/22   [name], MD  ipratropium (ATROVENT) 0.03 % nasal spray INSTILL 2 SPRAYS IN EACH NOSTRIL EVERY 12 HOURS 04/17/21   [name], MD  loratadine (CLARITIN) 10 MG tablet Take 10 mg by mouth daily as needed for allergies.    [provider]  MAGNESIUM PO Take 330 mg by mouth daily.    [provider]  Omega-3 Fatty Acids (FISH OIL) 1000 MG CAPS Take 1,000 mg by mouth 3 (three) times a week.    [provider]  ondansetron (ZOFRAN) 4 MG tablet TAKE 1 TABLET(4 MG) BY MOUTH EVERY 8 HOURS AS NEEDED FOR NAUSEA OR VOMITING 03/06/21   [name], PA-C  Plecanatide  (TRULANCE) 3 MG TABS Take 3 mg by mouth daily. 05/18/22   [name], [name] Friends, MD  rosuvastatin (CRESTOR) 10 MG tablet Take 1 tablet (10 mg total) by mouth daily. 01/06/22   [name], MD  sodium chloride (OCEAN) 0.65 % SOLN nasal spray Place 1 spray into both nostrils as needed for congestion.    [provider]  spironolactone (ALDACTONE) 25 MG tablet Take 1 tablet (25 mg total) by mouth daily. 03/12/22   [name], MD  topiramate (TOPAMAX) 25 MG tablet Take 1 tablet (25 mg total) by mouth at bedtime. 01/14/22   Marcos Eke, PA-C  triamcinolone cream (KENALOG) 0.1 % Apply 1 Application topically 2 (two) times daily. 10/29/21   [name], MD  valsartan (DIOVAN) 320 MG tablet TAKE 1 TABLET BY MOUTH EVERY DAY 08/20/22   [name], MD  vitamin E 400 UNIT capsule Take 400 Units by mouth 3 (three) times a week.    [provider]  zinc gluconate 50 MG tablet Take 50 mg by mouth daily.    [provider]      Allergies    Ciprofloxacin, Latex, and Vitamin c [bioflavonoid products]    Review of Systems   Review of Systems  Constitutional:  Negative for fever.  Neurological:  Positive for headaches.  All other systems reviewed and are negative.   Physical Exam Updated Vital Signs BP (!) 154/62   Pulse 67   Temp 97.9 F (36.6 C) (Oral)   Resp 16   Wt 75 kg   SpO2 100%   BMI 26.69 kg/m  Physical Exam Vitals and nursing note reviewed.  Constitutional:      Appearance: She is well-developed. She is not ill-appearing.  HENT:     Head: Normocephalic and atraumatic.  Eyes:     Pupils: Pupils are equal, round, and reactive to light.  Cardiovascular:     Rate and Rhythm: Normal rate and regular rhythm.     Heart sounds: Normal heart sounds.  Pulmonary:     Effort: Pulmonary effort is normal. No respiratory distress.     Breath sounds: Normal breath sounds. No wheezing.  Abdominal:     General: Bowel sounds are normal.     Palpations:  Abdomen is soft.  Musculoskeletal:     Cervical back: Neck supple.  Skin:    General: Skin is warm and dry.  Neurological:     Mental Status: She is alert and oriented to person, place, and time.     Comments: Cranial nerves II through XII intact, 5 out of 5 strength in all 4 extremities, no dysmetria to finger-nose-finger  Psychiatric:  Mood and Affect: Mood normal.     ED Results / Procedures / Treatments   Labs (all labs ordered are listed, but only abnormal results are displayed) Labs Reviewed  BASIC METABOLIC PANEL - Abnormal; Notable for the following components:      Result Value   Glucose, Bld 115 (*)    All other components within normal limits  CBC WITH DIFFERENTIAL/PLATELET    EKG None  Radiology CT Head Wo Contrast  Result Date: 11/09/2022 CLINICAL DATA:  Headache, dizziness EXAM: CT HEAD WITHOUT CONTRAST TECHNIQUE: Contiguous axial images were obtained from the base of the skull through the vertex without intravenous contrast. RADIATION DOSE REDUCTION: This exam was performed according to the departmental dose-optimization program which includes automated exposure control, adjustment of the mA and/or kV according to patient size and/or use of iterative reconstruction technique. COMPARISON:  MRI brain dated 10/02/2021 FINDINGS: Brain: No evidence of acute infarction, hemorrhage, hydrocephalus, extra-axial collection or mass lesion/mass effect. Vascular: Intracranial atherosclerosis. Skull: Normal. Negative for fracture or focal lesion. Sinuses/Orbits: The visualized paranasal sinuses are essentially clear. The mastoid air cells are unopacified. Other: None. IMPRESSION: Normal head CT. Electronically Signed   By: Charline Bills M.D.   On: 11/09/2022 03:52    Procedures Procedures    Medications Ordered in ED Medications - No data to display  ED Course/ Medical Decision Making/ A&P                             Medical Decision Making Amount and/or  Complexity of Data Reviewed Labs: ordered. Radiology: ordered.   This patient presents to the ED for concern of high blood pressure, this involves an extensive number of treatment options, and is a complaint that carries with it a high risk of complications and morbidity.  I considered the following differential and admission for this acute, potentially life threatening condition.  The differential diagnosis includes central hypertension, hypertensive urgency, hypertensive emergency, subarachnoid hemorrhage, tension headache  MDM:    This is a 68 year old female who presents with headache.  She states that her blood pressures have been high at home.  Blood pressures initially elevated with systolics at 186 here but this downtrended without intervention into the 150s over 60s.  Patient was essentially asymptomatic by the time I saw her.  She does not have any signs or symptoms of hypertensive urgency or emergency.  CT head is negative for acute bleed, basic lab work including CBC and BMP is reassuring.  I discussed this with the patient.  She remains asymptomatic.  Will have her follow-up with her primary physician for discussion of titration of medications.  Encouraged her to avoid intermittent doses of clonidine as this can result in rebound hypertension.  (Labs, imaging, consults)  Labs: I Ordered, and personally interpreted labs.  The pertinent results include: CBC, BMP  Imaging Studies ordered: I ordered imaging studies including CT head I independently visualized and interpreted imaging. I agree with the radiologist interpretation  Additional history obtained from chart review.  External records from outside source obtained and reviewed including prior evaluations  Cardiac Monitoring: The patient was maintained on a cardiac monitor.  If on the cardiac monitor, I personally viewed and interpreted the cardiac monitored which showed an underlying rhythm of: Sinus  rhythm  Reevaluation: After the interventions noted above, I reevaluated the patient and found that they have :resolved  Social Determinants of Health:  lives independently  Disposition: Discharge  Co  morbidities that complicate the patient evaluation  Past Medical History:  Diagnosis Date   Anxiety    Carotid stenosis, asymptomatic, bilateral    Cataract    Chronic left shoulder pain    Headache    Hiatal hernia    small   Hyperlipidemia    Hypertension    Osteoporosis    Pre-diabetes    Schatzki's ring    Seasonal allergies    Tremor      Medicines No orders of the defined types were placed in this encounter.   I have reviewed the patients home medicines and have made adjustments as needed  Problem List / ED Course: Problem List Items Addressed This Visit       Cardiovascular and Mediastinum   Essential hypertension   Other Visit Diagnoses     Acute nonintractable headache, unspecified headache type    -  Primary                   Final Clinical Impression(s) / ED Diagnoses Final diagnoses:  Acute nonintractable headache, unspecified headache type  Essential hypertension    Rx / DC Orders ED Discharge Orders     None         Shon Baton, MD 11/09/22 [phone]

## 2022-11-09 NOTE — ED Triage Notes (Signed)
Pt c/o headache x1 week, states she has been feeling dizzy. Hx of hypertension, reports it has been higher than normal the last few days but has been compliant with medication.

## 2022-11-09 NOTE — Chronic Care Management (AMB) (Signed)
   11/09/2022  [name] 12-20-1955 [id number]   Reason for Encounter: Patient is currently not enrolled in the CCM program. CCM enrollment status changed to "Previously enrolled"   Katha Cabal [facility] Health/Chronic Care Management [phone]

## 2022-11-10 NOTE — Transitions of Care (Post Inpatient/ED Visit) (Signed)
   11/10/2022  Name: [name] MRN: [id number] DOB: [birth year]  Today's TOC FU Call Status: Today's TOC FU Call Status:: Unsuccessful Call (2nd Attempt) Unsuccessful Call (1st Attempt) Date: 11/09/22 Unsuccessful Call (2nd Attempt) Date: 11/10/22  Attempted to reach the patient regarding the most recent Inpatient/ED visit.  Follow Up Plan: Additional outreach attempts will be made to reach the patient to complete the Transitions of Care (Post Inpatient/ED visit) call.   Signature  Abby Mariel Lukins, CMA  [facility] AWV Team Direct Dial: [phone]

## 2022-11-15 ENCOUNTER — Ambulatory Visit: Payer: Medicare Other | Admitting: Family Medicine

## 2022-11-15 DIAGNOSIS — J301 Allergic rhinitis due to pollen: Secondary | ICD-10-CM | POA: Diagnosis not present

## 2022-11-15 NOTE — Transitions of Care (Post Inpatient/ED Visit) (Signed)
   11/15/2022  Name: [name] MRN: [id number] DOB: 10-13-1955  Today's TOC FU Call Status: Today's TOC FU Call Status:: Unsuccessful Call (3rd Attempt) Unsuccessful Call (1st Attempt) Date: 11/09/22 Unsuccessful Call (2nd Attempt) Date: 11/10/22 Unsuccessful Call (3rd Attempt) Date: 11/15/22  Attempted to reach the patient regarding the most recent Inpatient/ED visit.  Follow Up Plan: No further outreach attempts will be made at this time. We have been unable to contact the patient.  Signature  Abby Shawntia Mangal, CMA  [facility] AWV Team Direct Dial: [phone]

## 2022-11-17 ENCOUNTER — Other Ambulatory Visit: Payer: Self-pay | Admitting: Family Medicine

## 2022-11-17 NOTE — Telephone Encounter (Signed)
Requested Prescriptions  Pending Prescriptions Disp Refills   valsartan (DIOVAN) 320 MG tablet [Pharmacy Med Name: VALSARTAN 320 MG TABLET] 90 tablet 0    Sig: TAKE 1 TABLET BY MOUTH EVERY DAY     Cardiovascular:  Angiotensin Receptor Blockers Failed - 11/17/2022  2:33 AM      Failed - Last BP in normal range    BP Readings from Last 1 Encounters:  11/09/22 (!) 154/62         Failed - Valid encounter within last 6 months    Recent Outpatient Visits           1 year ago Unilateral headache   [facility] Tanya Nones, Priscille Heidelberg, MD   1 year ago Lumbar radiculopathy, chronic   [facility] Pickard, Priscille Heidelberg, MD   1 year ago Viral upper respiratory tract infection   [facility] Tanya Nones, Priscille Heidelberg, MD   1 year ago Abnormal urine color   [facility] Tanya Nones, Priscille Heidelberg, MD   1 year ago Chest wall pain   [facility] Tanya Nones, Priscille Heidelberg, MD              Passed - Cr in normal range and within 180 days    Creat  Date Value Ref Range Status  06/11/2022 0.92 0.50 - 1.05 mg/dL Final   Creatinine, Ser  Date Value Ref Range Status  11/09/2022 0.95 0.44 - 1.00 mg/dL Final   Creatinine, Urine  Date Value Ref Range Status  01/20/2021 47 20 - 275 mg/dL Final         Passed - K in normal range and within 180 days    Potassium  Date Value Ref Range Status  11/09/2022 4.2 3.5 - 5.1 mmol/L Final  11/16/2012 4.1 mmol/L Final         Passed - Patient is not pregnant

## 2022-11-18 DIAGNOSIS — J309 Allergic rhinitis, unspecified: Secondary | ICD-10-CM | POA: Diagnosis not present

## 2022-11-24 ENCOUNTER — Ambulatory Visit (INDEPENDENT_AMBULATORY_CARE_PROVIDER_SITE_OTHER): Payer: Medicare Other | Admitting: Family Medicine

## 2022-11-24 ENCOUNTER — Encounter: Payer: Self-pay | Admitting: Family Medicine

## 2022-11-24 VITALS — BP 130/67 | HR 88 | Temp 98.9°F | Ht 66.0 in | Wt 167.0 lb

## 2022-11-24 DIAGNOSIS — Z794 Long term (current) use of insulin: Secondary | ICD-10-CM

## 2022-11-24 DIAGNOSIS — B9689 Other specified bacterial agents as the cause of diseases classified elsewhere: Secondary | ICD-10-CM | POA: Diagnosis not present

## 2022-11-24 DIAGNOSIS — J019 Acute sinusitis, unspecified: Secondary | ICD-10-CM | POA: Diagnosis not present

## 2022-11-24 DIAGNOSIS — E118 Type 2 diabetes mellitus with unspecified complications: Secondary | ICD-10-CM

## 2022-11-24 DIAGNOSIS — J069 Acute upper respiratory infection, unspecified: Secondary | ICD-10-CM | POA: Insufficient documentation

## 2022-11-24 MED ORDER — AMOXICILLIN-POT CLAVULANATE 875-125 MG PO TABS: 1.0000 | ORAL_TABLET | Freq: Two times a day (BID) | ORAL | 0 refills | Status: DC

## 2022-11-24 NOTE — Addendum Note (Signed)
Addended by: Arta Silence on: 11/24/2022 12:29 PM   Modules accepted: Orders

## 2022-11-24 NOTE — Assessment & Plan Note (Signed)
Patient symptoms consistent with viral URI that is progressing to bacterial sinusitis with fever and right sided sinus congestion. Will order Augmentin 875-125 BID for 7 days. Continue supportive care. Increase fluid intake with water or electrolyte solution like pedialyte. Encouraged acetaminophen as needed for fever/pain. Encouraged salt water gargling, chloraseptic spray and throat lozenges. Encouraged OTC guaifenesin. Encouraged saline sinus flushes and/or neti with humidified air.

## 2022-11-24 NOTE — Progress Notes (Signed)
Subjective:  HPI: [name] is a 67 y.o. female presenting on 11/24/2022 for No chief complaint on file.   HPI Patient is in today for 8 days of sore throat, fever, body aches, and mucopurulent cough. With sinus pressure on the right side and nasal congestion. Denies shortness of breath, wheezing, chest pain, N/V/D, rhinorrhea. No known sick exposures or home covid test. Has tried Tylenol.  Review of Systems  All other systems reviewed and are negative.   Relevant past medical history reviewed and updated as indicated.   Past Medical History:  Diagnosis Date   Anxiety    Carotid stenosis, asymptomatic, bilateral    Cataract    Chronic left shoulder pain    Headache    Hiatal hernia    small   Hyperlipidemia    Hypertension    Osteoporosis    Pre-diabetes    Schatzki's ring    Seasonal allergies    Tremor      Past Surgical History:  Procedure Laterality Date   CARDIAC CATHETERIZATION  2005   "Ms. Salada has essentially normal coronary arteries and normal left ventricular function.  She will be treated empirically with anti-reflux measures"   CATARACT EXTRACTION, BILATERAL     CHOLECYSTECTOMY     COLONOSCOPY    06/16/2004   IHK:[id number] rectum/Diminutive polyps of the splenic flexure and the cecum/The remainder of the colonic mucosa appeared normal pathology (hyperplastic polyps).   COLONOSCOPY N/A 08/07/2015   Dr. Jena Gauss: 6 mm descending colon tubular adenoma, multiple medium-mouthed diverticula in sigmoid colon. surveillance 2022   COLONOSCOPY N/A 04/15/2021   Procedure: COLONOSCOPY;  Surgeon: Corbin Ade, MD;  Location: [facility];  Service: Endoscopy;  Laterality: N/A;  9:30am   ESOPHAGOGASTRODUODENOSCOPY  09/11/2001   GLO:[id number] ring otherwise normal esophagus/Normal stomach/Normal D1 and D2 status post passage of a 56 French Maloney dilator   ESOPHAGOGASTRODUODENOSCOPY N/A 11/11/2017   Procedure: ESOPHAGOGASTRODUODENOSCOPY (EGD);  Surgeon:  Corbin Ade, MD; mild Schatzki ring s/p dilation and mild erosive gastropathy.   ESOPHAGOGASTRODUODENOSCOPY (EGD) WITH ESOPHAGEAL DILATION N/A 01/25/2013   Dr. Jena Gauss- schatzki's ring- 54 F dilation. small hiatal hernia   HYSTEROSCOPY WITH D & C N/A 03/06/2019   Procedure: DILATATION AND CURETTAGE /HYSTEROSCOPY;  Surgeon: Tilda Burrow, MD;  Location: [facility] ORS;  Service: Gynecology;  Laterality: N/A;   MALONEY DILATION N/A 11/11/2017   Procedure: Elease Hashimoto DILATION;  Surgeon: Corbin Ade, MD;  Location: [facility];  Service: Endoscopy;  Laterality: N/A;   POLYPECTOMY N/A 03/06/2019   Procedure: POLYPECTOMY ( REMOVAL OF ENDOMETRIAL POLYP);  Surgeon: Tilda Burrow, MD;  Location: [facility] OR[facility];  Service: Gynecology;  Laterality: N/A;   POLYPECTOMY  04/15/2021   Procedure: POLYPECTOMY;  Surgeon: Corbin Ade, MD;  Location: [facility];  Service: Endoscopy;;   TUBAL LIGATION      Allergies and medications reviewed and updated.   Current Outpatient Medications:    acetaminophen (TYLENOL) 500 MG tablet, Take 500 mg by mouth every 6 (six) hours as needed for moderate pain or headache., Disp: , Rfl:    alendronate (FOSAMAX) 70 MG tablet, Take 1 tablet (70 mg total) by mouth every 7 (seven) days. Take with a full glass of water on an empty stomach., Disp: 4 tablet, Rfl: 11   ALPRAZolam (XANAX) 0.5 MG tablet, TAKE 1 TABLET(0.5 MG) BY MOUTH TWICE DAILY AS NEEDED FOR ANXIETY, Disp: 180 tablet, Rfl: 0   amLODipine (NORVASC) 10 MG tablet, TAKE 1 TABLET(10 MG)  BY MOUTH AT BEDTIME, Disp: 90 tablet, Rfl: 0   amoxicillin-clavulanate (AUGMENTIN) 875-125 MG tablet, Take 1 tablet by mouth 2 (two) times daily., Disp: 14 tablet, Rfl: 0   aspirin EC 81 MG tablet, Take 81 mg by mouth at bedtime. , Disp: , Rfl:    Azelastine-Fluticasone (DYMISTA) 137-50 MCG/ACT SUSP, Place 2 sprays into the nose daily., Disp: 23 g, Rfl: 11   Calcium 500 MG tablet, Take 500 mg by mouth in the morning and at bedtime., Disp: ,  Rfl:    carvedilol (COREG) 3.125 MG tablet, TAKE 1 TABLET(3.125 MG) BY MOUTH TWICE DAILY, Disp: 180 tablet, Rfl: 3   cholecalciferol (VITAMIN D3) 25 MCG (1000 UT) tablet, Take 2,000 Units by mouth daily., Disp: , Rfl:    cloNIDine (CATAPRES) 0.1 MG tablet, Take 0.1 mg by mouth See admin instructions. Take 1/2 tablet daily as needed for blood pressure, Disp: , Rfl:    Coenzyme Q10 (COQ10) 100 MG CAPS, Take 100 mg by mouth daily., Disp: , Rfl:    CRANBERRY PO, Take 1 each by mouth 2 (two) times a week. Chewables, Disp: , Rfl:    Cyanocobalamin (B-12) 2500 MCG TABS, Take 2,500 mcg by mouth daily., Disp: , Rfl:    dexlansoprazole (DEXILANT) 60 MG capsule, Take 1 capsule (60 mg total) by mouth daily., Disp: 30 capsule, Rfl: 11   EPINEPHrine 0.3 mg/0.3 mL IJ SOAJ injection, Inject 0.3 mg into the muscle as needed for anaphylaxis., Disp: 1 each, Rfl: 1   hydroquinone 4 % cream, Apply 1 application topically 2 (two) times daily as needed (dark spots)., Disp: , Rfl:    hydrOXYzine (VISTARIL) 25 MG capsule, TAKE 1 CAPSULE(25 MG) BY MOUTH EVERY 8 HOURS AS NEEDED, Disp: 90 capsule, Rfl: 2   ipratropium (ATROVENT) 0.03 % nasal spray, INSTILL 2 SPRAYS IN EACH NOSTRIL EVERY 12 HOURS, Disp: 30 mL, Rfl: 12   loratadine (CLARITIN) 10 MG tablet, Take 10 mg by mouth daily as needed for allergies., Disp: , Rfl:    MAGNESIUM PO, Take 330 mg by mouth daily., Disp: , Rfl:    Omega-3 Fatty Acids (FISH OIL) 1000 MG CAPS, Take 1,000 mg by mouth 3 (three) times a week., Disp: , Rfl:    ondansetron (ZOFRAN) 4 MG tablet, TAKE 1 TABLET(4 MG) BY MOUTH EVERY 8 HOURS AS NEEDED FOR NAUSEA OR VOMITING, Disp: 30 tablet, Rfl: 0   Plecanatide (TRULANCE) 3 MG TABS, Take 3 mg by mouth daily., Disp: 30 tablet, Rfl: 11   rosuvastatin (CRESTOR) 10 MG tablet, Take 1 tablet (10 mg total) by mouth daily., Disp: 90 tablet, Rfl: 3   sodium chloride (OCEAN) 0.65 % SOLN nasal spray, Place 1 spray into both nostrils as needed for congestion., Disp:  , Rfl:    spironolactone (ALDACTONE) 25 MG tablet, Take 1 tablet (25 mg total) by mouth daily., Disp: 90 tablet, Rfl: 3   topiramate (TOPAMAX) 25 MG tablet, Take 1 tablet (25 mg total) by mouth at bedtime., Disp: 30 tablet, Rfl: 11   triamcinolone cream (KENALOG) 0.1 %, Apply 1 Application topically 2 (two) times daily., Disp: 30 g, Rfl: 0   valsartan (DIOVAN) 320 MG tablet, TAKE 1 TABLET BY MOUTH EVERY DAY, Disp: 90 tablet, Rfl: 0   vitamin E 400 UNIT capsule, Take 400 Units by mouth 3 (three) times a week., Disp: , Rfl:    zinc gluconate 50 MG tablet, Take 50 mg by mouth daily., Disp: , Rfl:    furosemide (LASIX) 20 MG  tablet, Take 1 tablet (20 mg total) by mouth daily as needed (Weight Gain 3lbs in 24 hrs 5lbs in 1 week)., Disp: 30 tablet, Rfl: 1  Allergies  Allergen Reactions   Ciprofloxacin Shortness Of Breath   Latex Other (See Comments)    Burns skin   Vitamin C [Bioflavonoid Products] Itching    Objective:   BP 130/67   Pulse 88   Temp 98.9 F (37.2 C) (Oral)   Ht 5\' 6"  (1.676 m)   Wt 167 lb (75.8 kg)   SpO2 97%   BMI 26.95 kg/m      11/24/2022   10:10 AM 11/09/2022    4:00 AM 11/09/2022    3:00 AM  Vitals with BMI  Height 5\' 6"     Weight 167 lbs    BMI 26.97    Systolic 130 154 161  Diastolic 67 62 59  Pulse 88 67 71     Physical Exam Vitals and nursing note reviewed.  Constitutional:      Appearance: Normal appearance. She is normal weight.  HENT:     Head: Normocephalic and atraumatic.     Nose: Congestion present.     Right Sinus: Maxillary sinus tenderness and frontal sinus tenderness present.     Mouth/Throat:     Mouth: Mucous membranes are moist.     Pharynx: Oropharynx is clear.  Eyes:     Conjunctiva/sclera: Conjunctivae normal.  Cardiovascular:     Rate and Rhythm: Normal rate and regular rhythm.     Pulses: Normal pulses.     Heart sounds: Normal heart sounds.  Pulmonary:     Effort: Pulmonary effort is normal.     Breath sounds: Normal  breath sounds.  Musculoskeletal:     Cervical back: Tenderness present.  Lymphadenopathy:     Cervical: No cervical adenopathy.  Skin:    General: Skin is warm and dry.  Neurological:     General: No focal deficit present.     Mental Status: She is alert and oriented to person, place, and time. Mental status is at baseline.  Psychiatric:        Mood and Affect: Mood normal.        Behavior: Behavior normal.        Thought Content: Thought content normal.        Judgment: Judgment normal.     Assessment & Plan:  Acute bacterial rhinosinusitis Assessment & Plan: Patient symptoms consistent with viral URI that is progressing to bacterial sinusitis with fever and right sided sinus congestion. Will order Augmentin 875-125 BID for 7 days. Continue supportive care. Increase fluid intake with water or electrolyte solution like pedialyte. Encouraged acetaminophen as needed for fever/pain. Encouraged salt water gargling, chloraseptic spray and throat lozenges. Encouraged OTC guaifenesin. Encouraged saline sinus flushes and/or neti with humidified air.    Other orders -     Amoxicillin-Pot Clavulanate; Take 1 tablet by mouth 2 (two) times daily.  Dispense: 14 tablet; Refill: 0     Follow up plan: Return if symptoms worsen or fail to improve.  Park Meo, FNP

## 2022-11-26 ENCOUNTER — Encounter: Payer: Self-pay | Admitting: Pharmacist

## 2022-11-26 NOTE — Progress Notes (Signed)
Patient previously followed by [facility] pharmacist. Per clinical review, no pharmacist appointment needed at this time.

## 2022-12-09 ENCOUNTER — Encounter: Payer: Medicare Other | Admitting: Pharmacist

## 2022-12-15 ENCOUNTER — Other Ambulatory Visit: Payer: Medicare Other

## 2022-12-15 ENCOUNTER — Ambulatory Visit: Payer: Medicare Other

## 2022-12-16 ENCOUNTER — Other Ambulatory Visit: Payer: Self-pay | Admitting: Family Medicine

## 2022-12-16 DIAGNOSIS — F419 Anxiety disorder, unspecified: Secondary | ICD-10-CM

## 2022-12-18 ENCOUNTER — Other Ambulatory Visit: Payer: Self-pay | Admitting: Family Medicine

## 2022-12-18 DIAGNOSIS — I10 Essential (primary) hypertension: Secondary | ICD-10-CM

## 2022-12-20 NOTE — Telephone Encounter (Signed)
Requested Prescriptions  Pending Prescriptions Disp Refills   amLODipine (NORVASC) 10 MG tablet [Pharmacy Med Name: AMLODIPINE BESYLATE 10MG  TABLETS] 90 tablet 0    Sig: TAKE 1 TABLET(10 MG) BY MOUTH AT BEDTIME     Cardiovascular: Calcium Channel Blockers 2 Failed - 12/18/2022  8:05 AM      Failed - Valid encounter within last 6 months    Recent Outpatient Visits           1 year ago Unilateral headache   [facility] [name], MD   1 year ago Lumbar radiculopathy, chronic   [facility] Pickard, Priscille Heidelberg, MD   1 year ago Viral upper respiratory tract infection   [facility] Tanya Nones, Priscille Heidelberg, MD   1 year ago Abnormal urine color   [facility] Tanya Nones, Priscille Heidelberg, MD   1 year ago Chest wall pain   [facility] Pickard, Priscille Heidelberg, MD              Passed - Last BP in normal range    BP Readings from Last 1 Encounters:  11/24/22 130/67         Passed - Last Heart Rate in normal range    Pulse Readings from Last 1 Encounters:  11/24/22 88

## 2022-12-22 ENCOUNTER — Other Ambulatory Visit: Payer: Self-pay

## 2022-12-22 ENCOUNTER — Other Ambulatory Visit: Payer: Medicare Other

## 2022-12-22 ENCOUNTER — Ambulatory Visit (INDEPENDENT_AMBULATORY_CARE_PROVIDER_SITE_OTHER): Payer: Medicare Other

## 2022-12-22 VITALS — Ht 66.0 in | Wt 167.0 lb

## 2022-12-22 DIAGNOSIS — Z7984 Long term (current) use of oral hypoglycemic drugs: Secondary | ICD-10-CM | POA: Diagnosis not present

## 2022-12-22 DIAGNOSIS — E118 Type 2 diabetes mellitus with unspecified complications: Secondary | ICD-10-CM

## 2022-12-22 DIAGNOSIS — Z794 Long term (current) use of insulin: Secondary | ICD-10-CM

## 2022-12-22 LAB — HM DIABETES EYE EXAM

## 2022-12-22 NOTE — Progress Notes (Signed)
[name] arrived 12/22/2022 and has given verbal consent to obtain images and complete their overdue diabetic retinal screening.  The images have been sent to an ophthalmologist or optometrist for review and interpretation.  Results will be sent back to [name], MD for review.  Patient has been informed they will be contacted when we receive the results via telephone or MyChart Pt verbalized understanding of all. Mjp,lpn

## 2022-12-23 LAB — BASIC METABOLIC PANEL
BUN/Creatinine Ratio: 11 (calc) (ref 6–22)
BUN: 12 mg/dL (ref 7–25)
CO2: 26 mmol/L (ref 20–32)
Calcium: 10.2 mg/dL (ref 8.6–10.4)
Chloride: 103 mmol/L (ref 98–110)
Creat: 1.09 mg/dL — ABNORMAL HIGH (ref 0.50–1.05)
Glucose, Bld: 108 mg/dL — ABNORMAL HIGH (ref 65–99)
Potassium: 4.6 mmol/L (ref 3.5–5.3)
Sodium: 137 mmol/L (ref 135–146)

## 2022-12-23 LAB — MICROALBUMIN / CREATININE URINE RATIO
Creatinine, Urine: 114 mg/dL (ref 20–275)
Microalb Creat Ratio: 4 mg/g{creat} (ref <30)
Microalb, Ur: 0.4 mg/dL

## 2022-12-23 LAB — HEMOGLOBIN A1C
Hgb A1c MFr Bld: 6.2 %{Hb} — ABNORMAL HIGH (ref <5.7)
Mean Plasma Glucose: 131 mg/dL
eAG (mmol/L): 7.3 mmol/L

## 2022-12-27 ENCOUNTER — Other Ambulatory Visit (HOSPITAL_COMMUNITY): Payer: Self-pay | Admitting: Family Medicine

## 2022-12-27 DIAGNOSIS — Z1231 Encounter for screening mammogram for malignant neoplasm of breast: Secondary | ICD-10-CM

## 2022-12-31 NOTE — Progress Notes (Signed)
Received results no diabetic retinopathy detected in either eye. Results have been routed to provider for review.

## 2023-01-04 ENCOUNTER — Ambulatory Visit (INDEPENDENT_AMBULATORY_CARE_PROVIDER_SITE_OTHER): Payer: Medicare Other | Admitting: Family Medicine

## 2023-01-04 VITALS — BP 138/78 | HR 80 | Temp 98.5°F | Ht 66.0 in | Wt 164.4 lb

## 2023-01-04 DIAGNOSIS — M792 Neuralgia and neuritis, unspecified: Secondary | ICD-10-CM

## 2023-01-04 MED ORDER — GABAPENTIN 300 MG PO CAPS
300.0000 mg | ORAL_CAPSULE | Freq: Every day | ORAL | 3 refills | Status: DC
Start: 2023-01-04 — End: 2023-06-29

## 2023-01-04 NOTE — Progress Notes (Signed)
Subjective:    Patient ID: [name], female    DOB: 1956-03-25, 67 y.o.   MRN: [id number]  Patient states that for the last several months, she has been getting an unusual pain in her right leg.  The pain is not as common in her left leg but also occurs there.  It is a throbbing pain.  It occurs primarily at night when she lies still.  If she gets up and moves around the pain gets better.  She also complains of pain in her shoulders and in her neck however the majority the pain is a throbbing radiating pain in her right leg.  The pain radiates from her knee down into her foot.  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  She also reports tingling and burning pain in her right leg.  The pain was worse recently when she was in a pool.  Water simply touching the leg make the pain more intense almost like a hyperesthesia.  Today on exam, there is no redness or swelling in the right leg.  There is no palpable abnormality.  She has normal range of motion in her right knee and in her right ankle. Past Medical History:  Diagnosis Date   Anxiety    Carotid stenosis, asymptomatic, bilateral    Cataract    Chronic left shoulder pain    Headache    Hiatal hernia    small   Hyperlipidemia    Hypertension    Osteoporosis    Pre-diabetes    Schatzki's ring    Seasonal allergies    Tremor      Past Surgical History:  Procedure Laterality Date   CARDIAC CATHETERIZATION  2005   "[name] has essentially normal coronary arteries and normal left ventricular function.  She will be treated empirically with anti-reflux measures"   CATARACT EXTRACTION, BILATERAL     CHOLECYSTECTOMY     COLONOSCOPY    06/16/2004   WGN:FAOZHY rectum/Diminutive polyps of the splenic flexure and the cecum/The remainder of the colonic mucosa appeared normal pathology (hyperplastic polyps).   COLONOSCOPY N/A 08/07/2015   Dr. Jena Gauss: 6 mm descending colon tubular adenoma,  multiple medium-mouthed diverticula in sigmoid colon. surveillance 2022   COLONOSCOPY N/A 04/15/2021   Procedure: COLONOSCOPY;  Surgeon: Corbin Ade, MD;  Location: [facility];  Service: Endoscopy;  Laterality: N/A;  9:30am   ESOPHAGOGASTRODUODENOSCOPY  09/11/2001   QMV:HQIONGEXB ring otherwise normal esophagus/Normal stomach/Normal D1 and D2 status post passage of a 56 French Maloney dilator   ESOPHAGOGASTRODUODENOSCOPY N/A 11/11/2017   Procedure: ESOPHAGOGASTRODUODENOSCOPY (EGD);  Surgeon: Corbin Ade, MD; mild Schatzki ring s/p dilation and mild erosive gastropathy.   ESOPHAGOGASTRODUODENOSCOPY (EGD) WITH ESOPHAGEAL DILATION N/A 01/25/2013   Dr. Jena Gauss- schatzki's ring- 54 F dilation. small hiatal hernia   HYSTEROSCOPY WITH D & C N/A 03/06/2019   Procedure: DILATATION AND CURETTAGE /HYSTEROSCOPY;  Surgeon: Tilda Burrow, MD;  Location: [facility];  Service: Gynecology;  Laterality: N/A;   MALONEY DILATION N/A 11/11/2017   Procedure: Elease Hashimoto DILATION;  Surgeon: Corbin Ade, MD;  Location: [facility];  Service: Endoscopy;  Laterality: N/A;   POLYPECTOMY N/A 03/06/2019   Procedure: POLYPECTOMY ( REMOVAL OF ENDOMETRIAL POLYP);  Surgeon: Tilda Burrow, MD;  Location: [facility];  Service: Gynecology;  Laterality: N/A;   POLYPECTOMY  04/15/2021   Procedure: POLYPECTOMY;  Surgeon: Corbin Ade, MD;  Location: [facility];  Service: Endoscopy;;   TUBAL LIGATION         Allergies  Allergen Reactions   Ciprofloxacin Shortness Of Breath   Latex Other (See Comments)    Burns skin   Vitamin C [Bioflavonoid Products] Itching   Social History   Socioeconomic History   Marital status: Single    Spouse name: Not on file   Number of children: 3   Years of education: Not on file   Highest education level: Not on file  Occupational History   Occupation: Public affairs consultant: [employer]  Tobacco Use   Smoking status: Former    Current packs/day: 0.00    Average  packs/day: 0.3 packs/day for 35.0 years (8.8 ttl pk-yrs)    Types: Cigarettes    Start date: 12/31/1977    Quit date: 12/31/2012    Years since quitting: 10.0   Smokeless tobacco: Never  Vaping Use   Vaping status: Never Used  Substance and Sexual Activity   Alcohol use: Not Currently   Drug use: No   Sexual activity: Not Currently    Partners: Male    Birth control/protection: Post-menopausal, Surgical    Comment: tubal  Other Topics Concern   Not on file  Social History Narrative   Eats all food groups.    Wears seatbelt.    Has 3 children.   Prior smoker.   Attends church.    Used to work in Designer, fashion/clothing.    Drives.   Enjoys going out with family.    Right handed   One story home   Social Determinants of Health   Financial Resource Strain: Low Risk  (06/01/2022)   Overall Financial Resource Strain (CARDIA)    Difficulty of Paying Living Expenses: Not hard at all  Food Insecurity: No Food Insecurity (06/01/2022)   Hunger Vital Sign    Worried About Running Out of Food in the Last Year: Never true    Ran Out of Food in the Last Year: Never true  Transportation Needs: No Transportation Needs (06/01/2022)   PRAPARE - Administrator, Civil Service (Medical): No    Lack of Transportation (Non-Medical): No  Physical Activity: Sufficiently Active (06/01/2022)   Exercise Vital Sign    Days of Exercise per Week: 5 days    Minutes of Exercise per Session: 30 min  Stress: No Stress Concern Present (06/01/2022)   [employer] of Occupational Health - Occupational Stress Questionnaire    Feeling of Stress : Not at all  Social Connections: Moderately Integrated (06/01/2022)   Social Connection and Isolation Panel [NHANES]    Frequency of Communication with Friends and Family: More than three times a week    Frequency of Social Gatherings with Friends and Family: Three times a week    Attends Religious Services: More than 4 times per year    Active Member of Clubs or  Organizations: Yes    Attends Banker Meetings: More than 4 times per year    Marital Status: Never married  Intimate Partner Violence: Not At Risk (06/01/2022)   Humiliation, Afraid, Rape, and Kick questionnaire    Fear of Current or Ex-Partner: No    Emotionally Abused: No    Physically Abused: No    Sexually Abused: No   Family History  Problem Relation Age of Onset   Diabetes Other    Diabetes Mother    Heart disease Mother    Heart disease Father    Diabetes  Sister    Diabetes Brother    Hypertension Brother    Diabetes Daughter    Hypertension Maternal Grandmother    Stroke Maternal Grandfather    Early death Paternal Grandfather    Colon cancer Neg Hx    Liver disease Neg Hx       Review of Systems  All other systems reviewed and are negative.      Objective:   Physical Exam Vitals reviewed.  Constitutional:      Appearance: She is well-developed.  HENT:     Nose:     Right Sinus: No maxillary sinus tenderness or frontal sinus tenderness.     Left Sinus: No maxillary sinus tenderness or frontal sinus tenderness.  Eyes:     General: No visual field deficit.    Extraocular Movements:     Right eye: Normal extraocular motion and no nystagmus.     Left eye: Normal extraocular motion and no nystagmus.     Conjunctiva/sclera: Conjunctivae normal.     Pupils:     Right eye: Pupil is round and reactive.     Left eye: Pupil is round and reactive.  Neck:     Vascular: No carotid bruit.  Cardiovascular:     Rate and Rhythm: Normal rate and regular rhythm.     Heart sounds: Normal heart sounds. No murmur heard.    No friction rub. No gallop.  Pulmonary:     Effort: Pulmonary effort is normal. No respiratory distress.     Breath sounds: Normal breath sounds. No stridor. No wheezing or rales.  Abdominal:     General: Abdomen is flat. Bowel sounds are normal. There is no distension.     Palpations: Abdomen is soft.     Tenderness: There is no  abdominal tenderness. There is no guarding.  Musculoskeletal:     Right hand: Normal. No swelling, deformity, tenderness or bony tenderness. Normal range of motion.     Left hand: Normal. No swelling, deformity, tenderness or bony tenderness. Normal range of motion.     Cervical back: Normal range of motion. No rigidity.     Right knee: Normal. No swelling, deformity, effusion or erythema.     Right lower leg: Normal. No swelling, deformity, tenderness or bony tenderness. No edema.     Left lower leg: No deformity, tenderness or bony tenderness.     Right ankle: Normal. No swelling.     Left ankle: Normal. No swelling.       Legs:  Lymphadenopathy:     Cervical: No cervical adenopathy.  Neurological:     Mental Status: She is alert and oriented to person, place, and time. Mental status is at baseline.     Cranial Nerves: No cranial nerve deficit, dysarthria or facial asymmetry.     Sensory: No sensory deficit.     Motor: Tremor present. No weakness or abnormal muscle tone.     Coordination: Romberg sign negative. Coordination normal.     Gait: Gait normal.         Assessment & Plan:  Neuropathic pain \\Pain  sounds neuropathic in nature possibly even restless leg.  Discontinue Topamax.  Replace with gabapentin 300 mg p.o. nightly.  Increase if beneficial and needed.  Also on the differential diagnosis would be statin induced myopathy however the pain seems to be limited only to the legs and I hate to discontinue Crestor given her carotid artery disease.  Varicose veins are also a possibility however she does not have  any significant swelling at the present time.  If the pain persist may need consult with vein and vascular Center.  Pain is not localized to the knee or ankle.  Therefore I do not feel that his arthritis.  The fact is bilateral ruled out malignant skeletal lesions as I believe this would be unlikely

## 2023-01-16 ENCOUNTER — Other Ambulatory Visit: Payer: Self-pay | Admitting: Family Medicine

## 2023-01-16 DIAGNOSIS — E785 Hyperlipidemia, unspecified: Secondary | ICD-10-CM

## 2023-01-18 NOTE — Telephone Encounter (Signed)
Requested Prescriptions  Pending Prescriptions Disp Refills   rosuvastatin (CRESTOR) 10 MG tablet [Pharmacy Med Name: ROSUVASTATIN 10MG  TABLETS] 90 tablet 0    Sig: TAKE 1 TABLET(10 MG) BY MOUTH DAILY     Cardiovascular:  Antilipid - Statins 2 Failed - 01/16/2023  6:01 PM      Failed - Cr in normal range and within 360 days    Creat  Date Value Ref Range Status  12/22/2022 1.09 (H) 0.50 - 1.05 mg/dL Final   Creatinine, Urine  Date Value Ref Range Status  12/22/2022 114 20 - 275 mg/dL Final         Failed - Valid encounter within last 12 months    Recent Outpatient Visits           1 year ago Unilateral headache   [facility] [name], MD   1 year ago Lumbar radiculopathy, chronic   [facility] Pickard, Priscille Heidelberg, MD   1 year ago Viral upper respiratory tract infection   [facility] Tanya Nones, Priscille Heidelberg, MD   1 year ago Abnormal urine color   [facility] Tanya Nones, Priscille Heidelberg, MD   1 year ago Chest wall pain   [facility] [name], MD              Failed - Lipid Panel in normal range within the last 12 months    Cholesterol  Date Value Ref Range Status  06/11/2022 175 <200 mg/dL Final   LDL Cholesterol (Calc)  Date Value Ref Range Status  06/11/2022 94 mg/dL (calc) Final    Comment:    Reference range: <100 . Desirable range <100 mg/dL for primary prevention;   <70 mg/dL for patients with CHD or diabetic patients  with > or = 2 CHD risk factors. Marland Kitchen LDL-C is now calculated using the Martin-Hopkins  calculation, which is a validated novel method providing  better accuracy than the Friedewald equation in the  estimation of LDL-C.  Horald Pollen et al. Lenox Ahr. 1610;960(45): 2061-2068  (http://education.[facility].com/faq/FAQ164)    HDL  Date Value Ref Range Status  06/11/2022 62 > OR = 50 mg/dL Final   Triglycerides  Date Value Ref Range Status  06/11/2022 99 <150  mg/dL Final         Passed - Patient is not pregnant

## 2023-01-20 ENCOUNTER — Ambulatory Visit: Payer: Medicare Other | Admitting: Physician Assistant

## 2023-01-20 ENCOUNTER — Encounter: Payer: Self-pay | Admitting: Physician Assistant

## 2023-01-20 VITALS — BP 128/59 | HR 78 | Resp 20 | Ht 66.0 in | Wt 166.0 lb

## 2023-01-20 DIAGNOSIS — G250 Essential tremor: Secondary | ICD-10-CM

## 2023-01-20 DIAGNOSIS — R519 Headache, unspecified: Secondary | ICD-10-CM | POA: Diagnosis not present

## 2023-01-20 DIAGNOSIS — G8929 Other chronic pain: Secondary | ICD-10-CM

## 2023-01-20 NOTE — Patient Instructions (Addendum)
  Continue metoprolol for BP and for tremors  Follow up in 1 year  Continue the CoQ, B6 and Mag and tylenol  Continue gabapentin as directed by your doctor.  Recommend good stress management  Follow with cardiology for murmurs, carotid bruits, blood pressure.   Limit use of pain relievers to no more than 2 days out of the week.  These medications include acetaminophen, NSAIDs (ibuprofen/Advil/Motrin, naproxen/Aleve, triptans (Imitrex/sumatriptan), Excedrin, and narcotics.  This will help reduce risk of rebound headaches. Be aware of common food triggers:  - Caffeine:  coffee, black tea, cola, Mt. Dew  - Chocolate  - Dairy:  aged cheeses (brie, blue, cheddar, gouda, [place], provolone, [place], Swiss, etc), chocolate milk, buttermilk, sour cream, limit eggs and yogurt  - Nuts, peanut butter  - Alcohol  - Cereals/grains:  FRESH breads (fresh bagels, sourdough, doughnuts), yeast productions  - Processed/canned/aged/cured meats (pre-packaged deli meats, hotdogs)  - MSG/glutamate:  soy sauce, flavor enhancer, pickled/preserved/marinated foods  - Sweeteners:  aspartame (Equal, Nutrasweet).  Sugar and Splenda are okay  - Vegetables:  legumes (lima beans, lentils, snow peas, fava beans, pinto peans, peas, garbanzo beans), sauerkraut, onions, olives, pickles  - Fruit:  avocados, bananas, citrus fruit (orange, lemon, grapefruit), mango  - Other:  Frozen meals, macaroni and cheese Routine exercise Stay adequately hydrated (aim for 64 oz water daily) Keep headache diary Maintain proper stress management Maintain proper sleep hygiene Do not skip meals Consider supplements:  magnesium citrate 400mg  daily, riboflavin 400mg  daily, coenzyme Q10 100mg  three times daily. 50 25

## 2023-01-20 NOTE — Progress Notes (Signed)
[facility] Neurology Division Clinic Note - Progress Visit   Date: 01/20/23  [name] MRN: [id number] DOB: 17-Apr-1956  Reason for visit: Evaluation of chronic headaches.  History of Present Illness:  Onset:  Night headaches stopped after beginning to take Singulair having better BP control.  "Headaches may appear in the early evening ". Quality:  throbbing , feels tight and pulsating  Intensity: 3/10 (improved from prior) Location: B temporal across forehead and sometimes in the occipital area. Sometimes a "heartbeat in the face"  Duration: 20 mins   Recently (July 2024) patient presented to the ED with an episode of headaches believed to be hypertensive (197/115) described as pulsating around her face and head with "tingling in the hands ".  CT of the head was negative.  Blood pressure was treated, her symptoms relieved. Frequency: about 3 times a week, worse when stressed."Less than before" Associated symptoms: nausea  Aura: wiggly lines, but only twice over the last 6 months    Activity Does home weekly yoga, not recently  Aggravating factors: Sounds. Dreams have gotten better.   Relieving factors: trying to calm herself down, breathe, and takes Tylenol and Xanax   Current abortive medications: Tylenol every 2-3 weeks  Current prophylactic medications: "magnesium, B6, CoQ "  Past abortive medications:  Past prophylactic medications: Topamax "tired and no energy"    Family history: GF stroke, no family or personal history of headaches or seizures Smoker: no  Alcohol: no  Caffeine: none Sleep: Better  Stress: Her brother and 2 grandkids, lives with her parents and stress to her life   CTA 09/09/2021 personally reviewed, agree with radiology: 1. No emergent finding. 2. At least 70% atheromatous narrowing of the right common carotid bifurcation and right cavernous ICA. 50% stenosis of the paraclinoid right ICA. 3. 45% narrowing at the right vertebral  origin. 4. Mild intracranial branch atherosclerosis.  MRI of the brain 10/02/2021 no acute intracranial process. No evidence of acute or subacute infarct.    Initial visit 09/2021   Patient is a very pleasant 67 year old woman f initially seen at [facility] neurology for evaluation of essential tremor, currently on metoprolol,  history of hypertension, hyperlipidemia, anxiety, and a history of abdominal aortic aneurysm and prediabetes.  She recently had been seen at the ED on 10/02/2021 with complaints of significant headaches, "jumbled words ", and slight "left hand heaviness with the headaches ".  This last month, her blood pressure has been significantly uncontrolled reaching up to 200s.  She has some floaters in her right eye, otherwise she denies any visual complaints.  She has chronic bilateral foot tingling, but there is no new areas of numbness.  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  Also seen, 45% narrowing of the right vertebral origin.  Pending on these findings, an MRI of the brain without contrast was performed, negative for stroke or other abnormality is.  It was suspected that these symptoms were due to hypertensive emergency, which had resolved at the time of neurological evaluation.  She was discharged on gabapentin 300 mg twice daily, but the patient reduced it to 300 mg at night, because of increased sleepiness.  Patient is on statin, metoprolol, amlodipine, and clonidine as needed as well. Denies any history of TIA. Denies vertigo dizziness or vision changes. Denies dysphagia.  No confusion Denies any fever or chills, or night sweats. No tobacco. No new meds or hormonal supplements. Does take a regular ASA a day, with no other antiplatelets or anticoagulants.Denies any recent long distance trips or  recent surgeries. No sick contacts. No new stressors present in personal life.  Patient is compliant with his medications.Patient is active. She is here in follow-up.  She is alone during this visit.  Out-side paper records, electronic medical record, and images have been reviewed where available and summarized as:  Lab Results  Component Value Date   HGBA1C 6.2 (H) 12/22/2022   Lab Results  Component Value Date   VITAMINB12 >2,000 (H) 01/20/2021   Lab Results  Component Value Date   TSH 1.84 10/29/2019   Lab Results  Component Value Date   ESRSEDRATE 14 06/10/2020    Past Medical History:  Diagnosis Date   Anxiety    Carotid stenosis, asymptomatic, bilateral    Cataract    Chronic left shoulder pain    Headache    Hiatal hernia    small   Hyperlipidemia    Hypertension    Osteoporosis    Pre-diabetes    Schatzki's ring    Seasonal allergies    Tremor     Past Surgical History:  Procedure Laterality Date   CARDIAC CATHETERIZATION  2005   "[name] has essentially normal coronary arteries and normal left ventricular function.  She will be treated empirically with anti-reflux measures"   CATARACT EXTRACTION, BILATERAL     CHOLECYSTECTOMY     COLONOSCOPY    06/16/2004   [id number] rectum/Diminutive polyps of the splenic flexure and the cecum/The remainder of the colonic mucosa appeared normal pathology (hyperplastic polyps).   COLONOSCOPY N/A 08/07/2015   Dr. Jena Gauss: 6 mm descending colon tubular adenoma, multiple medium-mouthed diverticula in sigmoid colon. surveillance 2022   COLONOSCOPY N/A 04/15/2021   Procedure: COLONOSCOPY;  Surgeon: Corbin Ade, MD;  Location: [facility];  Service: Endoscopy;  Laterality: N/A;  9:30am   ESOPHAGOGASTRODUODENOSCOPY  09/11/2001   [id number] ring otherwise normal esophagus/Normal stomach/Normal D1 and D2 status post passage of a 56 French Maloney dilator   ESOPHAGOGASTRODUODENOSCOPY N/A 11/11/2017   Procedure:  ESOPHAGOGASTRODUODENOSCOPY (EGD);  Surgeon: Corbin Ade, MD; mild Schatzki ring s/p dilation and mild erosive gastropathy.   ESOPHAGOGASTRODUODENOSCOPY (EGD) WITH ESOPHAGEAL DILATION N/A 01/25/2013   Dr. Jena Gauss- schatzki's ring- 54 F dilation. small hiatal hernia   HYSTEROSCOPY WITH D & C N/A 03/06/2019   Procedure: DILATATION AND CURETTAGE /HYSTEROSCOPY;  Surgeon: Tilda Burrow, MD;  Location: [facility];  Service: Gynecology;  Laterality: N/A;   MALONEY DILATION N/A 11/11/2017   Procedure: Elease Hashimoto DILATION;  Surgeon: Corbin Ade, MD;  Location: [facility];  Service: Endoscopy;  Laterality: N/A;   POLYPECTOMY N/A 03/06/2019   Procedure: POLYPECTOMY ( REMOVAL OF ENDOMETRIAL POLYP);  Surgeon: Tilda Burrow, MD;  Location: [facility];  Service: Gynecology;  Laterality: N/A;   POLYPECTOMY  04/15/2021   Procedure: POLYPECTOMY;  Surgeon: Corbin Ade, MD;  Location: [facility];  Service: Endoscopy;;   TUBAL LIGATION       Medications:  Outpatient Encounter Medications as of 01/20/2023  Medication Sig Note   acetaminophen (TYLENOL) 500 MG tablet Take 500 mg by mouth every 6 (six) hours as needed for moderate pain or headache.    alendronate (FOSAMAX) 70 MG tablet Take 1 tablet (70 mg total) by mouth every 7 (seven) days. Take with a full glass  of water on an empty stomach.    ALPRAZolam (XANAX) 0.5 MG tablet TAKE 1 TABLET(0.5 MG) BY MOUTH TWICE DAILY AS NEEDED FOR ANXIETY    amLODipine (NORVASC) 10 MG tablet TAKE 1 TABLET(10 MG) BY MOUTH AT BEDTIME    aspirin EC 81 MG tablet Take 81 mg by mouth at bedtime.     Azelastine-Fluticasone (DYMISTA) 137-50 MCG/ACT SUSP Place 2 sprays into the nose daily.    Calcium 500 MG tablet Take 500 mg by mouth in the morning and at bedtime.    carvedilol (COREG) 3.125 MG tablet TAKE 1 TABLET(3.125 MG) BY MOUTH TWICE DAILY    celecoxib (CELEBREX) 200 MG capsule TAKE 1 CAPSULE(200 MG) BY MOUTH DAILY    cholecalciferol (VITAMIN D3) 25 MCG (1000 UT) tablet  Take 2,000 Units by mouth daily.    cloNIDine (CATAPRES) 0.1 MG tablet Take 0.1 mg by mouth See admin instructions. Take 1/2 tablet daily as needed for blood pressure 04/13/2022: Pt takes as needed.    Coenzyme Q10 (COQ10) 100 MG CAPS Take 100 mg by mouth daily.    CRANBERRY PO Take 1 each by mouth 2 (two) times a week. Chewables    Cyanocobalamin (B-12) 2500 MCG TABS Take 2,500 mcg by mouth daily.    dexlansoprazole (DEXILANT) 60 MG capsule Take 1 capsule (60 mg total) by mouth daily.    doxazosin (CARDURA) 4 MG tablet TAKE 1 TABLET(4 MG) BY MOUTH DAILY    EPINEPHrine 0.3 mg/0.3 mL IJ SOAJ injection Inject 0.3 mg into the muscle as needed for anaphylaxis.    gabapentin (NEURONTIN) 300 MG capsule Take 1 capsule (300 mg total) by mouth at bedtime.    hydroquinone 4 % cream Apply 1 application topically 2 (two) times daily as needed (dark spots).    hydrOXYzine (VISTARIL) 25 MG capsule TAKE 1 CAPSULE(25 MG) BY MOUTH EVERY 8 HOURS AS NEEDED    ipratropium (ATROVENT) 0.03 % nasal spray INSTILL 2 SPRAYS IN EACH NOSTRIL EVERY 12 HOURS    loratadine (CLARITIN) 10 MG tablet Take 10 mg by mouth daily as needed for allergies.    MAGNESIUM PO Take 330 mg by mouth daily.    Omega-3 Fatty Acids (FISH OIL) 1000 MG CAPS Take 1,000 mg by mouth 3 (three) times a week.    ondansetron (ZOFRAN) 4 MG tablet TAKE 1 TABLET(4 MG) BY MOUTH EVERY 8 HOURS AS NEEDED FOR NAUSEA OR VOMITING    Plecanatide (TRULANCE) 3 MG TABS Take 3 mg by mouth daily.    rosuvastatin (CRESTOR) 10 MG tablet TAKE 1 TABLET(10 MG) BY MOUTH DAILY    sodium chloride (OCEAN) 0.65 % SOLN nasal spray Place 1 spray into both nostrils as needed for congestion.    spironolactone (ALDACTONE) 25 MG tablet Take 1 tablet (25 mg total) by mouth daily.    topiramate (TOPAMAX) 25 MG tablet Take 1 tablet (25 mg total) by mouth at bedtime.    triamcinolone cream (KENALOG) 0.1 % Apply 1 Application topically 2 (two) times daily.    valsartan (DIOVAN) 320 MG  tablet TAKE 1 TABLET BY MOUTH EVERY DAY    vitamin E 400 UNIT capsule Take 400 Units by mouth 3 (three) times a week.    zinc gluconate 50 MG tablet Take 50 mg by mouth daily.    furosemide (LASIX) 20 MG tablet Take 1 tablet (20 mg total) by mouth daily as needed (Weight Gain 3lbs in 24 hrs 5lbs in 1 week). 04/13/2022: 04/13/22-Pt states she takes as needed.  No facility-administered encounter medications on file as of 01/20/2023.    Allergies:  Allergies  Allergen Reactions   Ciprofloxacin Shortness Of Breath   Latex Other (See Comments)    Burns skin   Vitamin C [Bioflavonoid Products] Itching    Family History: Family History  Problem Relation Age of Onset   Diabetes Other    Diabetes Mother    Heart disease Mother    Heart disease Father    Diabetes Sister    Diabetes Brother    Hypertension Brother    Diabetes Daughter    Hypertension Maternal Grandmother    Stroke Maternal Grandfather    Early death Paternal Grandfather    Colon cancer Neg Hx    Liver disease Neg Hx     Social History: Social History   Tobacco Use   Smoking status: Former    Current packs/day: 0.00    Average packs/day: 0.3 packs/day for 35.0 years (8.8 ttl pk-yrs)    Types: Cigarettes    Start date: 12/31/1977    Quit date: 12/31/2012    Years since quitting: 10.0   Smokeless tobacco: Never  Vaping Use   Vaping status: Never Used  Substance Use Topics   Alcohol use: Not Currently   Drug use: No   Social History   Social History Narrative   Eats all food groups.    Wears seatbelt.    Has 3 children.   Prior smoker.   Attends church.    Used to work in Designer, fashion/clothing.    Drives.   Enjoys going out with family.    Right handed   One story home    Vital Signs:  BP (!) 128/59   Pulse 78   Resp 20   Ht 5\' 6"  (1.676 m)   Wt 166 lb (75.3 kg)   SpO2 98%   BMI 26.79 kg/m    General Medical Exam:   General:  Well appearing, comfortable.   Eyes/ENT: see cranial nerve examination.    Neck: Bilateral soft carotid bruits. Respiratory:  Clear to auscultation, good air entry bilaterally.   Cardiac:  Regular rate and rhythm, soft murmur.   Extremities:  No deformities, edema, or skin discoloration.  Skin:  No rashes or lesions.  Neurological Exam: MENTAL STATUS including orientation to time, place, person, recent and remote memory, attention span and concentration, language, and fund of knowledge is normal.  Speech is not dysarthric.  CRANIAL NERVES: II:  No visual field defects.  Fundi not visualized III-IV-VI: Pupils equal round and reactive to light.  Normal conjugate, extra-ocular eye movements in all directions of gaze.  No nystagmus.  No ptosis.   V:  Normal facial sensation.    VII:  Normal facial symmetry and movements.   VIII:  Normal hearing and vestibular function.   IX-X:  Normal palatal movement.   XI:  Normal shoulder shrug and head rotation.   XII:  Normal tongue strength and range of motion, no deviation or fasciculation.  MOTOR:  No atrophy, fasciculations.  Left greater than right very mild tremor worse on intention, no head tremor.  No pronator drift. No cogwheeling   SENSORY:  Normal and symmetric perception of light touch, pinprick, vibration, and proprioception.  Romberg's sign absent.   COORDINATION/GAIT: Normal finger-to- nose-finger and heel-to-shin.  Intact rapid alternating movements bilaterally.  Able to rise from a chair without using arms.  Gait narrow based and stable. Tandem and stressed gait intact.    IMPRESSION/PLAN:  Headaches, likely due  to uncontrollable hypertension. 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  Also seen, 45% narrowing of the right vertebral origin.    MRI of the brain 10/02/2021 no acute intracranial process. No evidence of  acute or subacute infarct.    For abortive therapy, continue Tylenol  Limit use of pain relievers to no more than 2 days out of week to prevent risk of rebound or medication-overuse headache.  Keep headache diary  Exercise, hydration, caffeine cessation, sleep hygiene, monitor for and avoid triggers Continue magnesium citrate 400mg  daily, riboflavin 400mg  daily, and coenzyme Q10 100mg  three times daily Strong monitor of Blood pressure with current med regimen  as per  Cards and PCP Follow up in 6 months    Essential Tremors No significant changes from prior visit  Continue metoprolol    Total time spent: 20 min   Thank you for allowing me to participate in patient's care.  If I can answer any additional questions, I would be pleased to do so.    Sincerely,   Marlowe Kays, PA-C

## 2023-01-28 ENCOUNTER — Encounter (HOSPITAL_COMMUNITY): Payer: Self-pay

## 2023-01-28 ENCOUNTER — Ambulatory Visit (HOSPITAL_COMMUNITY)
Admission: RE | Admit: 2023-01-28 | Discharge: 2023-01-28 | Disposition: A | Discharge status: Home / Self Care | Payer: Medicare Other | Source: Ambulatory Visit | Attending: Family Medicine | Admitting: Family Medicine

## 2023-01-28 DIAGNOSIS — Z1231 Encounter for screening mammogram for malignant neoplasm of breast: Secondary | ICD-10-CM | POA: Insufficient documentation

## 2023-02-03 ENCOUNTER — Encounter: Payer: Self-pay | Admitting: Family Medicine

## 2023-02-03 ENCOUNTER — Ambulatory Visit (INDEPENDENT_AMBULATORY_CARE_PROVIDER_SITE_OTHER): Payer: Medicare Other | Admitting: Family Medicine

## 2023-02-03 VITALS — BP 110/68 | HR 74 | Temp 98.0°F | Ht 66.0 in | Wt 163.6 lb

## 2023-02-03 DIAGNOSIS — R0789 Other chest pain: Secondary | ICD-10-CM

## 2023-02-03 NOTE — Progress Notes (Signed)
Subjective:    Patient ID: [name], female    DOB: 1955-06-19, 67 y.o.   MRN: [id number]  Over the last 3 weeks, the patient has had intermittent stabbing chest pain.  It starts on the left side of her chest and races horizontally across her chest.  There is no specific triggering event.  She denies any shortness of breath.  She denies any dyspnea on exertion.  She denies any cough.  She denies any fevers or chills.  She denies any pleurisy.  She denies any hemoptysis.  She denies any angina.  The pain is sharp, instantaneous, and lasts just a split-second and then goes away. Past Medical History:  Diagnosis Date   Anxiety    Carotid stenosis, asymptomatic, bilateral    Cataract    Chronic left shoulder pain    Headache    Hiatal hernia    small   Hyperlipidemia    Hypertension    Osteoporosis    Pre-diabetes    Schatzki's ring    Seasonal allergies    Tremor      Past Surgical History:  Procedure Laterality Date   CARDIAC CATHETERIZATION  2005   "Ms. Huskey has essentially normal coronary arteries and normal left ventricular function.  She will be treated empirically with anti-reflux measures"   CATARACT EXTRACTION, BILATERAL     CHOLECYSTECTOMY     COLONOSCOPY    06/16/2004   [id number] rectum/Diminutive polyps of the splenic flexure and the cecum/The remainder of the colonic mucosa appeared normal pathology (hyperplastic polyps).   COLONOSCOPY N/A 08/07/2015   Dr. Jena Gauss: 6 mm descending colon tubular adenoma, multiple medium-mouthed diverticula in sigmoid colon. surveillance 2022   COLONOSCOPY N/A 04/15/2021   Procedure: COLONOSCOPY;  Surgeon: Corbin Ade, MD;  [facility];  Service: Endoscopy;  Laterality: N/A;  9:30am   ESOPHAGOGASTRODUODENOSCOPY  09/11/2001   [id number] ring otherwise normal esophagus/Normal stomach/Normal D1 and D2 status post passage of a 56 French Maloney dilator   ESOPHAGOGASTRODUODENOSCOPY N/A 11/11/2017   Procedure:  ESOPHAGOGASTRODUODENOSCOPY (EGD);  Surgeon: Corbin Ade, MD; mild Schatzki ring s/p dilation and mild erosive gastropathy.   ESOPHAGOGASTRODUODENOSCOPY (EGD) WITH ESOPHAGEAL DILATION N/A 01/25/2013   Dr. Jena Gauss- schatzki's ring- 54 F dilation. small hiatal hernia   HYSTEROSCOPY WITH D & C N/A 03/06/2019   Procedure: DILATATION AND CURETTAGE /HYSTEROSCOPY;  Surgeon: Tilda Burrow, MD;  Location: [facility];  Service: Gynecology;  Laterality: N/A;   MALONEY DILATION N/A 11/11/2017   Procedure: Elease Hashimoto DILATION;  Surgeon: Corbin Ade, MD;  [facility]: [facility];  Service: Endoscopy;  Laterality: N/A;   POLYPECTOMY N/A 03/06/2019   Procedure: POLYPECTOMY ( REMOVAL OF ENDOMETRIAL POLYP);  Surgeon: Tilda Burrow, MD;  Location: [facility];  Service: Gynecology;  Laterality: N/A;   POLYPECTOMY  04/15/2021   Procedure: POLYPECTOMY;  Surgeon: Corbin Ade, MD;  [facility]: [facility];  Service: Endoscopy;;   TUBAL LIGATION         Allergies  Allergen Reactions   Ciprofloxacin Shortness Of Breath   Latex Other (See Comments)    Burns skin   Vitamin C [Bioflavonoid Products] Itching   Social History   Socioeconomic History   Marital status: Single    Spouse name: Not on file   Number of children: 3   Years of education: Not on file   Highest education level: Not on file  Occupational History   Occupation: Public affairs consultant: [employer]  Tobacco  Use   Smoking status: Former    Current packs/day: 0.00    Average packs/day: 0.3 packs/day for 35.0 years (8.8 ttl pk-yrs)    Types: Cigarettes    Start date: 12/31/1977    Quit date: 12/31/2012    Years since quitting: 10.0   Smokeless tobacco: Never  Vaping Use   Vaping status: Never Used  Substance and Sexual Activity   Alcohol use: Not Currently   Drug use: No   Sexual activity: Not Currently    Partners: Male    Birth control/protection: Post-menopausal, Surgical    Comment: tubal  Other Topics Concern   Not  on file  Social History Narrative   Eats all food groups.    Wears seatbelt.    Has 3 children.   Prior smoker.   Attends church.    Used to work in Designer, fashion/clothing.    Drives.   Enjoys going out with family.    Right handed   One story home   Social Determinants of Health   Financial Resource Strain: Low Risk  (06/01/2022)   Overall Financial Resource Strain (CARDIA)    Difficulty of Paying Living Expenses: Not hard at all  Food Insecurity: No Food Insecurity (06/01/2022)   Hunger Vital Sign    Worried About Running Out of Food in the Last Year: Never true    Ran Out of Food in the Last Year: Never true  Transportation Needs: No Transportation Needs (06/01/2022)   PRAPARE - Administrator, Civil Service (Medical): No    Lack of Transportation (Non-Medical): No  Physical Activity: Sufficiently Active (06/01/2022)   Exercise Vital Sign    Days of Exercise per Week: 5 days    Minutes of Exercise per Session: 30 min  Stress: No Stress Concern Present (06/01/2022)   [employer] of Occupational Health - Occupational Stress Questionnaire    Feeling of Stress : Not at all  Social Connections: Moderately Integrated (06/01/2022)   Social Connection and Isolation Panel [NHANES]    Frequency of Communication with Friends and Family: More than three times a week    Frequency of Social Gatherings with Friends and Family: Three times a week    Attends Religious Services: More than 4 times per year    Active Member of Clubs or Organizations: Yes    Attends Banker Meetings: More than 4 times per year    Marital Status: Never married  Intimate Partner Violence: Not At Risk (06/01/2022)   Humiliation, Afraid, Rape, and Kick questionnaire    Fear of Current or Ex-Partner: No    Emotionally Abused: No    Physically Abused: No    Sexually Abused: No   Family History  Problem Relation Age of Onset   Diabetes Other    Diabetes Mother    Heart disease Mother    Heart  disease Father    Diabetes Sister    Diabetes Brother    Hypertension Brother    Diabetes Daughter    Hypertension Maternal Grandmother    Stroke Maternal Grandfather    Early death Paternal Grandfather    Colon cancer Neg Hx    Liver disease Neg Hx       Review of Systems  All other systems reviewed and are negative.      Objective:   Physical Exam Vitals reviewed.  Constitutional:      Appearance: She is well-developed.  HENT:     Nose:     Right Sinus:  No maxillary sinus tenderness or frontal sinus tenderness.     Left Sinus: No maxillary sinus tenderness or frontal sinus tenderness.  Eyes:     General: No visual field deficit.    Extraocular Movements:     Right eye: Normal extraocular motion and no nystagmus.     Left eye: Normal extraocular motion and no nystagmus.     Conjunctiva/sclera: Conjunctivae normal.     Pupils:     Right eye: Pupil is round and reactive.     Left eye: Pupil is round and reactive.  Neck:     Vascular: No carotid bruit.  Cardiovascular:     Rate and Rhythm: Normal rate and regular rhythm.     Heart sounds: Normal heart sounds. No murmur heard.    No friction rub. No gallop.  Pulmonary:     Effort: Pulmonary effort is normal. No respiratory distress.     Breath sounds: Normal breath sounds. No stridor. No wheezing or rales.  Abdominal:     General: Abdomen is flat. Bowel sounds are normal. There is no distension.     Palpations: Abdomen is soft.     Tenderness: There is no abdominal tenderness. There is no guarding.  Musculoskeletal:     Right hand: Normal. No swelling, deformity, tenderness or bony tenderness. Normal range of motion.     Left hand: Normal. No swelling, deformity, tenderness or bony tenderness. Normal range of motion.     Cervical back: Normal range of motion. No rigidity.     Right lower leg: Normal. No swelling, deformity, tenderness or bony tenderness. No edema.     Left lower leg: No deformity, tenderness or  bony tenderness.     Right ankle: Normal. No swelling.     Left ankle: Normal. No swelling.  Lymphadenopathy:     Cervical: No cervical adenopathy.  Neurological:     Mental Status: She is alert and oriented to person, place, and time. Mental status is at baseline.     Cranial Nerves: No cranial nerve deficit, dysarthria or facial asymmetry.     Sensory: No sensory deficit.     Motor: Tremor present. No weakness or abnormal muscle tone.     Coordination: Romberg sign negative. Coordination normal.     Gait: Gait normal.         Assessment & Plan:  Chest wall pain Patient's pain does not sound cardiac in nature.  It is unrelated to exercise or exertion.  It only lasts a split-second and resolve spontaneously.  The pain is sharp.  She denies any shortness of breath or cough or pleurisy or hemoptysis.  I believe this is most likely musculoskeletal.  Patient states the pain began after she lifted a heavy object.  It is been gradually getting better over the last 3 weeks.  Therefore I believe this is musculoskeletal chest wall pain and I expect that the pain will subside over the next week.  If the pain changes or worsens in any way she is to contact me immediately

## 2023-02-16 ENCOUNTER — Telehealth: Payer: Self-pay | Admitting: Family Medicine

## 2023-02-16 ENCOUNTER — Encounter: Payer: Self-pay | Admitting: *Deleted

## 2023-02-16 ENCOUNTER — Ambulatory Visit: Payer: Medicare Other | Admitting: Gastroenterology

## 2023-02-16 ENCOUNTER — Encounter: Payer: Self-pay | Admitting: Gastroenterology

## 2023-02-16 VITALS — BP 176/76 | HR 80 | Temp 98.4°F | Ht 66.0 in | Wt 168.2 lb

## 2023-02-16 DIAGNOSIS — R1310 Dysphagia, unspecified: Secondary | ICD-10-CM | POA: Diagnosis not present

## 2023-02-16 DIAGNOSIS — K219 Gastro-esophageal reflux disease without esophagitis: Secondary | ICD-10-CM | POA: Diagnosis not present

## 2023-02-16 DIAGNOSIS — Z860101 Personal history of adenomatous and serrated colon polyps: Secondary | ICD-10-CM

## 2023-02-16 DIAGNOSIS — R1319 Other dysphagia: Secondary | ICD-10-CM

## 2023-02-16 DIAGNOSIS — K5900 Constipation, unspecified: Secondary | ICD-10-CM

## 2023-02-16 NOTE — Patient Instructions (Signed)
Stop Dexilant. Start pantoprazole 40mg  daily before breakfast. You can take a second dose before your evening meal if needed. RX sent to your pharmacy. Continue Trulance 3mg  daily for constipation.  Upper endoscopy to be scheduled.  Colonoscopy due 04/2024.

## 2023-02-16 NOTE — Telephone Encounter (Signed)
Patient states that she was told to call after coming off of her blood pressure medication. She states that it has started running high again and wasn't sure if you were going to start her back on medication or not. She doesn't want to go back on what she was on previously.  CB# [phone]

## 2023-02-16 NOTE — Progress Notes (Signed)
GI Office Note    Referring Provider: [name], MD Primary Care Physician:  [name], MD  Primary Gastroenterologist: Roetta Sessions, MD   Chief Complaint   Chief Complaint  Patient presents with   Dysphagia    Issues with swallowing, feels like food is getting hung.   Gastroesophageal Reflux    Has burning in her chest.     History of Present Illness   [name] is a 67 y.o. female presenting today for follow up. H/o GERD, dysphagia, constipation. Last seen in 05/2022.   After last ov she was switched from pantoprazole and Linzess to dexlansoprazole and Trulance respectively.   Initially she felt like dexlansoprazole was controlling her reflux symptoms. Now she feels like it lost efficacy. She is having frequent heartburn difficult to manage. She is having recurrent dysphagia, did very well for 4-5 years after esophageal dilation for Schatzki's ring but now having same symptoms of solid foods, especially chicken, vegetables. Food feels like it is sticking in the chest and/or throat region. She would like to go back on pantoprazole, seemed to help better and liked option of BID dosing if needed. She is having some nausea several times per week, no vomiting. Using zofran as needed.   Tulance did not seem to help constipation initially but now feels like stools well controlled. BM 1-2 times per day. No melena, brbpr.   Under the direction of her PCP, she stopped amlodipine about 2 weeks ago due to hypotension. After off medication for one week, BP creeping back up. Plans to call PCP today.  Wt Readings from Last 15 Encounters:  02/16/23 168 lb 3.2 oz (76.3 kg)  02/03/23 163 lb 9.6 oz (74.2 kg)  01/20/23 166 lb (75.3 kg)  01/04/23 164 lb 6.4 oz (74.6 kg)  12/22/22 167 lb (75.8 kg)  11/24/22 167 lb (75.8 kg)  11/09/22 165 lb 5.5 oz (75 kg)  09/01/22 165 lb (74.8 kg)  08/20/22 162 lb (73.5 kg)  07/19/22 165 lb (74.8 kg)  07/05/22 163 lb 12.8 oz  (74.3 kg)  06/21/22 167 lb (75.8 kg)  06/11/22 163 lb (73.9 kg)  06/01/22 164 lb (74.4 kg)  05/18/22 164 lb 3.2 oz (74.5 kg)    Colonoscopy 04/2021: -diverticulosis -four 4-107mm polyps sigmoid colon and asc colon removed -ascending colon polyps tubular adenoma -next colonoscopy in 3 years  EGD 11/2017: -mild Schatzki ring s/p dilation -erosive gastropathy  Medications   Current Outpatient Medications  Medication Sig Dispense Refill   acetaminophen (TYLENOL) 500 MG tablet Take 500 mg by mouth every 6 (six) hours as needed for moderate pain or headache.     alendronate (FOSAMAX) 70 MG tablet Take 1 tablet (70 mg total) by mouth every 7 (seven) days. Take with a full glass of water on an empty stomach. 4 tablet 11   ALPRAZolam (XANAX) 0.5 MG tablet TAKE 1 TABLET(0.5 MG) BY MOUTH TWICE DAILY AS NEEDED FOR ANXIETY 180 tablet 0   aspirin EC 81 MG tablet Take 81 mg by mouth at bedtime.      Calcium 500 MG tablet Take 500 mg by mouth in the morning and at bedtime.     carvedilol (COREG) 3.125 MG tablet TAKE 1 TABLET(3.125 MG) BY MOUTH TWICE DAILY 180 tablet 3   cholecalciferol (VITAMIN D3) 25 MCG (1000 UT) tablet Take 2,000 Units by mouth daily.     Coenzyme Q10 (COQ10) 100 MG CAPS Take 100 mg by mouth daily.  CRANBERRY PO Take 1 each by mouth 2 (two) times a week. Chewables     Cyanocobalamin (B-12) 2500 MCG TABS Take 2,500 mcg by mouth daily.     dexlansoprazole (DEXILANT) 60 MG capsule Take 1 capsule (60 mg total) by mouth daily. 30 capsule 11   doxazosin (CARDURA) 4 MG tablet TAKE 1 TABLET(4 MG) BY MOUTH DAILY 30 tablet 1   EPINEPHrine 0.3 mg/0.3 mL IJ SOAJ injection Inject 0.3 mg into the muscle as needed for anaphylaxis. 1 each 1   fluticasone (FLONASE) 50 MCG/ACT nasal spray Place 2 sprays into both nostrils daily.     gabapentin (NEURONTIN) 300 MG capsule Take 1 capsule (300 mg total) by mouth at bedtime. 90 capsule 3   hydroquinone 4 % cream Apply 1 application topically 2 (two)  times daily as needed (dark spots).     hydrOXYzine (VISTARIL) 25 MG capsule TAKE 1 CAPSULE(25 MG) BY MOUTH EVERY 8 HOURS AS NEEDED 90 capsule 2   ipratropium (ATROVENT) 0.03 % nasal spray INSTILL 2 SPRAYS IN EACH NOSTRIL EVERY 12 HOURS 30 mL 12   loratadine (CLARITIN) 10 MG tablet Take 10 mg by mouth daily as needed for allergies.     MAGNESIUM PO Take 330 mg by mouth daily.     Omega-3 Fatty Acids (FISH OIL) 1000 MG CAPS Take 1,000 mg by mouth 3 (three) times a week.     ondansetron (ZOFRAN) 4 MG tablet TAKE 1 TABLET(4 MG) BY MOUTH EVERY 8 HOURS AS NEEDED FOR NAUSEA OR VOMITING 30 tablet 0   Plecanatide (TRULANCE) 3 MG TABS Take 3 mg by mouth daily. 30 tablet 11   rosuvastatin (CRESTOR) 10 MG tablet TAKE 1 TABLET(10 MG) BY MOUTH DAILY 90 tablet 0   sodium chloride (OCEAN) 0.65 % SOLN nasal spray Place 1 spray into both nostrils as needed for congestion.     spironolactone (ALDACTONE) 25 MG tablet Take 1 tablet (25 mg total) by mouth daily. 90 tablet 3   triamcinolone cream (KENALOG) 0.1 % Apply 1 Application topically 2 (two) times daily. 30 g 0   valsartan (DIOVAN) 320 MG tablet TAKE 1 TABLET BY MOUTH EVERY DAY 90 tablet 0   vitamin E 400 UNIT capsule Take 400 Units by mouth 3 (three) times a week.     zinc gluconate 50 MG tablet Take 50 mg by mouth daily.     No current facility-administered medications for this visit.    Allergies   Allergies as of 02/16/2023 - Review Complete 02/16/2023  Allergen Reaction Noted   Ciprofloxacin Shortness Of Breath 12/27/2011   Latex Other (See Comments) 10/17/2019   Vitamin c [bioflavonoid products] Itching 04/09/2021     Past Medical History   Past Medical History:  Diagnosis Date   Anxiety    Carotid stenosis, asymptomatic, bilateral    Cataract    Chronic left shoulder pain    Headache    Hiatal hernia    small   Hyperlipidemia    Hypertension    Osteoporosis    Pre-diabetes    Schatzki's ring    Seasonal allergies    Tremor      Past Surgical History   Past Surgical History:  Procedure Laterality Date   CARDIAC CATHETERIZATION  2005   "Ms. Struve has essentially normal coronary arteries and normal left ventricular function.  She will be treated empirically with anti-reflux measures"   CATARACT EXTRACTION, BILATERAL     CHOLECYSTECTOMY     COLONOSCOPY    06/16/2004  [id number] rectum/Diminutive polyps of the splenic flexure and the cecum/The remainder of the colonic mucosa appeared normal pathology (hyperplastic polyps).   COLONOSCOPY N/A 08/07/2015   Dr. Jena Gauss: 6 mm descending colon tubular adenoma, multiple medium-mouthed diverticula in sigmoid colon. surveillance 2022   COLONOSCOPY N/A 04/15/2021   Procedure: COLONOSCOPY;  Surgeon: Corbin Ade, MD;  Location: [facility];  Service: Endoscopy;  Laterality: N/A;  9:30am   ESOPHAGOGASTRODUODENOSCOPY  09/11/2001   [id number]:[id number] ring otherwise normal esophagus/Normal stomach/Normal D1 and D2 status post passage of a 56 French Maloney dilator   ESOPHAGOGASTRODUODENOSCOPY N/A 11/11/2017   Procedure: ESOPHAGOGASTRODUODENOSCOPY (EGD);  Surgeon: Corbin Ade, MD; mild Schatzki ring s/p dilation and mild erosive gastropathy.   ESOPHAGOGASTRODUODENOSCOPY (EGD) WITH ESOPHAGEAL DILATION N/A 01/25/2013   Dr. Jena Gauss- schatzki's ring- 52 F dilation. small hiatal hernia   HYSTEROSCOPY WITH D & C N/A 03/06/2019   Procedure: DILATATION AND CURETTAGE /HYSTEROSCOPY;  Surgeon: Tilda Burrow, MD;  Location: [facility];  Service: Gynecology;  Laterality: N/A;   MALONEY DILATION N/A 11/11/2017   Procedure: Elease Hashimoto DILATION;  Surgeon: Corbin Ade, MD;  Location: [facility];  Service: Endoscopy;  Laterality: N/A;   POLYPECTOMY N/A 03/06/2019   Procedure: POLYPECTOMY ( REMOVAL OF ENDOMETRIAL POLYP);  Surgeon: Tilda Burrow, MD;  Location: [facility];  Service: Gynecology;  Laterality: N/A;   POLYPECTOMY  04/15/2021   Procedure: POLYPECTOMY;  Surgeon: Corbin Ade, MD;   Location: [facility];  Service: Endoscopy;;   TUBAL LIGATION      Past Family History   Family History  Problem Relation Age of Onset   Diabetes Other    Diabetes Mother    Heart disease Mother    Heart disease Father    Diabetes Sister    Diabetes Brother    Hypertension Brother    Diabetes Daughter    Hypertension Maternal Grandmother    Stroke Maternal Grandfather    Early death Paternal Grandfather    Colon cancer Neg Hx    Liver disease Neg Hx     Past Social History   Social History   Socioeconomic History   Marital status: Single    Spouse name: Not on file   Number of children: 3   Years of education: Not on file   Highest education level: Not on file  Occupational History   Occupation: Public affairs consultant: [employer]  Tobacco Use   Smoking status: Former    Current packs/day: 0.00    Average packs/day: 0.3 packs/day for 35.0 years (8.8 ttl pk-yrs)    Types: Cigarettes    Start date: 12/31/1977    Quit date: 12/31/2012    Years since quitting: 10.1   Smokeless tobacco: Never  Vaping Use   Vaping status: Never Used  Substance and Sexual Activity   Alcohol use: Not Currently   Drug use: No   Sexual activity: Not Currently    Partners: Male    Birth control/protection: Post-menopausal, Surgical    Comment: tubal  Other Topics Concern   Not on file  Social History Narrative   Eats all food groups.    Wears seatbelt.    Has 3 children.   Prior smoker.   Attends church.    Used to work in Designer, fashion/clothing.    Drives.   Enjoys going out with family.    Right handed   One story home   Social Determinants of Health   Financial Resource Strain: Low Risk  (  06/01/2022)   Overall Financial Resource Strain (CARDIA)    Difficulty of Paying Living Expenses: Not hard at all  Food Insecurity: No Food Insecurity (06/01/2022)   Hunger Vital Sign    Worried About Running Out of Food in the Last Year: Never true    Ran Out of Food in the Last Year:  Never true  Transportation Needs: No Transportation Needs (06/01/2022)   PRAPARE - Administrator, Civil Service (Medical): No    Lack of Transportation (Non-Medical): No  Physical Activity: Sufficiently Active (06/01/2022)   Exercise Vital Sign    Days of Exercise per Week: 5 days    Minutes of Exercise per Session: 30 min  Stress: No Stress Concern Present (06/01/2022)   [employer] of Occupational Health - Occupational Stress Questionnaire    Feeling of Stress : Not at all  Social Connections: Moderately Integrated (06/01/2022)   Social Connection and Isolation Panel [NHANES]    Frequency of Communication with Friends and Family: More than three times a week    Frequency of Social Gatherings with Friends and Family: Three times a week    Attends Religious Services: More than 4 times per year    Active Member of Clubs or Organizations: Yes    Attends Banker Meetings: More than 4 times per year    Marital Status: Never married  Intimate Partner Violence: Not At Risk (06/01/2022)   Humiliation, Afraid, Rape, and Kick questionnaire    Fear of Current or Ex-Partner: No    Emotionally Abused: No    Physically Abused: No    Sexually Abused: No    Review of Systems   General: Negative for anorexia, weight loss, fever, chills, fatigue, weakness. ENT: Negative for hoarseness, difficulty swallowing , nasal congestion. CV: Negative for chest pain, angina, palpitations, dyspnea on exertion, peripheral edema.  Respiratory: Negative for dyspnea at rest, dyspnea on exertion, cough, sputum, wheezing.  GI: See history of present illness. GU:  Negative for dysuria, hematuria, urinary incontinence, urinary frequency, nocturnal urination.  Endo: Negative for unusual weight change.     Physical Exam   BP (!) 176/76 (BP Location: Right Arm, Patient Position: Sitting, Cuff Size: Normal)   Pulse 80   Temp 98.4 F (36.9 C) (Oral)   Ht 5\' 6"  (1.676 m)   Wt 168 lb 3.2 oz  (76.3 kg)   SpO2 97%   BMI 27.15 kg/m    General: Well-nourished, well-developed in no acute distress.  Eyes: No icterus. Mouth: Oropharyngeal mucosa moist and pink , no lesions erythema or exudate. Lungs: Clear to auscultation bilaterally.  Heart: Regular rate and rhythm, no murmurs rubs or gallops.  Abdomen: Bowel sounds are normal, nontender, nondistended, no hepatosplenomegaly or masses, no hernia , no rebound or guarding. +abd bruit bilateral, central  Rectal: not performed  Extremities: No lower extremity edema. No clubbing or deformities. Neuro: Alert and oriented x 4   Skin: Warm and dry, no jaundice.   Psych: Alert and cooperative, normal mood and affect.  Labs   Lab Results  Component Value Date   NA 137 12/22/2022   CL 103 12/22/2022   K 4.6 12/22/2022   CO2 26 12/22/2022   BUN 12 12/22/2022   CREATININE 1.09 (H) 12/22/2022   GFRNONAA >60 11/09/2022   CALCIUM 10.2 12/22/2022   ALBUMIN 4.6 02/03/2022   GLUCOSE 108 (H) 12/22/2022   Lab Results  Component Value Date   HGBA1C 6.2 (H) 12/22/2022   Lab Results  Component Value Date   WBC 8.1 11/09/2022   HGB 12.9 11/09/2022   HCT 39.1 11/09/2022   MCV 91.1 11/09/2022   PLT 270 11/09/2022   Lab Results  Component Value Date   ALT 12 06/11/2022   AST 16 06/11/2022   ALKPHOS 70 02/03/2022   BILITOT 0.7 06/11/2022    Imaging Studies   MM 3D SCREENING MAMMOGRAM BILATERAL BREAST  Result Date: 01/31/2023 CLINICAL DATA:  Screening. EXAM: DIGITAL SCREENING BILATERAL MAMMOGRAM WITH TOMOSYNTHESIS AND CAD TECHNIQUE: Bilateral screening digital craniocaudal and mediolateral oblique mammograms were obtained. Bilateral screening digital breast tomosynthesis was performed. The images were evaluated with computer-aided detection. COMPARISON:  Previous exam(s). ACR Breast Density Category b: There are scattered areas of fibroglandular density. FINDINGS: There are no findings suspicious for malignancy. IMPRESSION: No  mammographic evidence of malignancy. A result letter of this screening mammogram will be mailed directly to the patient. RECOMMENDATION: Screening mammogram in one year. (Code:SM-B-01Y) BI-RADS CATEGORY  1: Negative. Electronically Signed   By: Elberta Fortis M.D.   On: 01/31/2023 12:07    Assessment/Plan:   GERD/esophageal dysphagia -heartburn continues to be refractory to treatment, now with recurrent solid food dysphagia -stop dexlansoprazole -start pantoprazole 40mg  once to twice daily before a meal -EGD/ED with Dr. Jena Gauss. ASA 3.  I have discussed the risks, alternatives, benefits with regards to but not limited to the risk of reaction to medication, bleeding, infection, perforation and the patient is agreeable to proceed. Written consent to be obtained.  Constipation -doing better on Trulance 3mg  daily  H/O adenomatous colon polyps -next colonoscopy due 04/2024  The patient was found to have elevated blood pressure when vital signs were checked in the office. The blood pressure was rechecked by the nursing staff and it was found be persistently elevated >140/90 mmHg. I personally advised to the patient to follow up closely with his PCP for hypertension control.      Leanna Battles. Melvyn Neth, MHS, PA-C [facility]

## 2023-02-17 ENCOUNTER — Encounter: Payer: Self-pay | Admitting: *Deleted

## 2023-02-17 ENCOUNTER — Other Ambulatory Visit: Payer: Self-pay | Admitting: Family Medicine

## 2023-02-17 NOTE — Telephone Encounter (Signed)
Requested Prescriptions  Pending Prescriptions Disp Refills   valsartan (DIOVAN) 320 MG tablet [Pharmacy Med Name: VALSARTAN 320 MG TABLET] 90 tablet 1    Sig: TAKE 1 TABLET BY MOUTH EVERY DAY     Cardiovascular:  Angiotensin Receptor Blockers Failed - 02/17/2023  2:37 AM      Failed - Cr in normal range and within 180 days    Creat  Date Value Ref Range Status  12/22/2022 1.09 (H) 0.50 - 1.05 mg/dL Final   Creatinine, Urine  Date Value Ref Range Status  12/22/2022 114 20 - 275 mg/dL Final         Failed - Last BP in normal range    BP Readings from Last 1 Encounters:  02/16/23 (!) 176/76         Failed - Valid encounter within last 6 months    Recent Outpatient Visits           1 year ago Unilateral headache   [facility] [name], MD   1 year ago Lumbar radiculopathy, chronic   [facility] Pickard, Priscille Heidelberg, MD   1 year ago Viral upper respiratory tract infection   [facility] Tanya Nones, Priscille Heidelberg, MD   1 year ago Abnormal urine color   [facility] Tanya Nones, Priscille Heidelberg, MD   1 year ago Chest wall pain   [facility] [name], MD              Passed - K in normal range and within 180 days    Potassium  Date Value Ref Range Status  12/22/2022 4.6 3.5 - 5.3 mmol/L Final  11/16/2012 4.1 mmol/L Final         Passed - Patient is not pregnant

## 2023-02-17 NOTE — Telephone Encounter (Signed)
Per pt, she was told to stop Amlodipine 10 mg.

## 2023-02-22 ENCOUNTER — Other Ambulatory Visit: Payer: Self-pay | Admitting: Physician Assistant

## 2023-02-24 ENCOUNTER — Other Ambulatory Visit: Payer: Self-pay | Admitting: Family Medicine

## 2023-02-24 ENCOUNTER — Other Ambulatory Visit: Payer: Self-pay | Admitting: *Deleted

## 2023-02-24 DIAGNOSIS — I6523 Occlusion and stenosis of bilateral carotid arteries: Secondary | ICD-10-CM

## 2023-02-24 DIAGNOSIS — I739 Peripheral vascular disease, unspecified: Secondary | ICD-10-CM

## 2023-02-24 NOTE — Telephone Encounter (Signed)
Requested Prescriptions  Pending Prescriptions Disp Refills   spironolactone (ALDACTONE) 25 MG tablet [Pharmacy Med Name: SPIRONOLACTONE 25MG  TABLETS] 90 tablet 0    Sig: TAKE 1 TABLET(25 MG) BY MOUTH DAILY     Cardiovascular: Diuretics - Aldosterone Antagonist Failed - 02/24/2023  6:45 AM      Failed - Cr in normal range and within 180 days    Creat  Date Value Ref Range Status  12/22/2022 1.09 (H) 0.50 - 1.05 mg/dL Final   Creatinine, Urine  Date Value Ref Range Status  12/22/2022 114 20 - 275 mg/dL Final         Failed - Last BP in normal range    BP Readings from Last 1 Encounters:  02/16/23 (!) 176/76         Failed - Valid encounter within last 6 months    Recent Outpatient Visits           1 year ago Unilateral headache   [facility] [name], MD   1 year ago Lumbar radiculopathy, chronic   [facility] Pickard, Priscille Heidelberg, MD   1 year ago Viral upper respiratory tract infection   [facility] Tanya Nones, Priscille Heidelberg, MD   1 year ago Abnormal urine color   [facility] [name], MD   1 year ago Chest wall pain   [facility] Tanya Nones, Priscille Heidelberg, MD              Passed - K in normal range and within 180 days    Potassium  Date Value Ref Range Status  12/22/2022 4.6 3.5 - 5.3 mmol/L Final  11/16/2012 4.1 mmol/L Final         Passed - Na in normal range and within 180 days    Sodium  Date Value Ref Range Status  12/22/2022 137 135 - 146 mmol/L Final  11/16/2012 141 137 - 147 mmol/L Final         Passed - eGFR is 30 or above and within 180 days    GFR, Est African American  Date Value Ref Range Status  06/10/2020 74 > OR = 60 mL/min/1.61m2 Final   GFR, Est Non African American  Date Value Ref Range Status  06/10/2020 64 > OR = 60 mL/min/1.2m2 Final   GFR, Estimated  Date Value Ref Range Status  11/09/2022 >60 >60 mL/min Final    Comment:     (NOTE) Calculated using the CKD-EPI Creatinine Equation (2021)    eGFR  Date Value Ref Range Status  06/11/2022 68 > OR = 60 mL/min/1.25m2 Final

## 2023-03-09 ENCOUNTER — Ambulatory Visit: Payer: Medicare Other | Admitting: Physician Assistant

## 2023-03-09 ENCOUNTER — Ambulatory Visit (INDEPENDENT_AMBULATORY_CARE_PROVIDER_SITE_OTHER)
Admission: RE | Admit: 2023-03-09 | Discharge: 2023-03-09 | Discharge status: Home / Self Care | Payer: Medicare Other | Source: Ambulatory Visit | Attending: Vascular Surgery

## 2023-03-09 ENCOUNTER — Ambulatory Visit (HOSPITAL_COMMUNITY)
Admission: RE | Admit: 2023-03-09 | Discharge: 2023-03-09 | Disposition: A | Discharge status: Home / Self Care | Payer: Medicare Other | Source: Ambulatory Visit | Attending: Vascular Surgery | Admitting: Vascular Surgery

## 2023-03-09 VITALS — BP 158/71 | HR 68 | Temp 98.1°F | Ht 66.0 in | Wt 160.8 lb

## 2023-03-09 DIAGNOSIS — I739 Peripheral vascular disease, unspecified: Secondary | ICD-10-CM | POA: Diagnosis not present

## 2023-03-09 DIAGNOSIS — I70211 Atherosclerosis of native arteries of extremities with intermittent claudication, right leg: Secondary | ICD-10-CM | POA: Diagnosis not present

## 2023-03-09 DIAGNOSIS — I6523 Occlusion and stenosis of bilateral carotid arteries: Secondary | ICD-10-CM

## 2023-03-09 LAB — VAS US ABI WITH/WO TBI
Left ABI: 0.81
Right ABI: 0.62

## 2023-03-09 NOTE — Progress Notes (Signed)
Office Note     CC:  follow up Requesting Provider:  [name], MD  HPI: [name] is a 67 y.o. (Mar 14, 1956) female who presents for follow up of carotid artery stenosis and PAD. She has right ICA stenosis measured 60 to 79% and left ICA stenosis measured 1 to 39%. She has no prior history of stroke or TIA.   She has known history of chronic bitemporal migraines. This is often exacerbated by uncontrolled HTN. Generally when her bp is elevated she gets more symptomatic with left arm weakness, sometimes word finding difficulty as well. Unless she has a headache this does not occur. She does feel that this is occurring more regularly. She is followed by Neurology for this.   She is concerned today about a high pitch tone that she has in her ears. She says it is constantly there but at times becomes very intense. She also reports that while sleeping she can here "whooshing" sound in  her ears. She has seen her Neurologist and also ENT for this. Feels no one has really given her an answer as to why. She denies any loss of vision or amaurosis. She does report that she had an episode of completely blurred vision in her left eye and after keeping  her eye closed for several moments the blurring eventually dissipated and returned to normal. She does not have slurred speech, facial drooping, unilateral upper or lower extremity weakness or numbness unless as stated above with her headaches. She does have long history of bilateral foot numbness but this is unchanged. She takes Neurontin for this. She does have cramping/tightness in her right calf on ambulation. She however says she can walk for an hour or more before it bothers her. It is relieved with rest. She occasionally has some discomfort down her right shin at night time but this is very intermittent. Does not wake her up out of sleep. No classic rest pain symptoms. No tissue loss. She does Yoga a couple times a week and walks regularly. She  is medically managed on Aspirin and statin.  She is on BB, Vistaril, Diuretic and ARB for hypertension management She is a pre diabetic  She is a Former smoker, quit 2014  Past Medical History:  Diagnosis Date   Anxiety    Carotid stenosis, asymptomatic, bilateral    Cataract    Chronic left shoulder pain    Headache    Hiatal hernia    small   Hyperlipidemia    Hypertension    Osteoporosis    Pre-diabetes    Schatzki's ring    Seasonal allergies    Tremor     Past Surgical History:  Procedure Laterality Date   CARDIAC CATHETERIZATION  2005   "Ms. Piper has essentially normal coronary arteries and normal left ventricular function.  She will be treated empirically with anti-reflux measures"   CATARACT EXTRACTION, BILATERAL     CHOLECYSTECTOMY     COLONOSCOPY    06/16/2004   VZD:GLOVFI rectum/Diminutive polyps of the splenic flexure and the cecum/The remainder of the colonic mucosa appeared normal pathology (hyperplastic polyps).   COLONOSCOPY N/A 08/07/2015   Dr. Jena Gauss: 6 mm descending colon tubular adenoma, multiple medium-mouthed diverticula in sigmoid colon. surveillance 2022   COLONOSCOPY N/A 04/15/2021   Procedure: COLONOSCOPY;  Surgeon: Corbin Ade, MD;  Location: [facility];  Service: Endoscopy;  Laterality: N/A;  9:30am   ESOPHAGOGASTRODUODENOSCOPY  09/11/2001   EPP:IRJJOACZY ring otherwise normal esophagus/Normal stomach/Normal D1  and D2 status post passage of a 57 French Maloney dilator   ESOPHAGOGASTRODUODENOSCOPY N/A 11/11/2017   Procedure: ESOPHAGOGASTRODUODENOSCOPY (EGD);  Surgeon: Corbin Ade, MD; mild Schatzki ring s/p dilation and mild erosive gastropathy.   ESOPHAGOGASTRODUODENOSCOPY (EGD) WITH ESOPHAGEAL DILATION N/A 01/25/2013   Dr. Jena Gauss- schatzki's ring- 54 F dilation. small hiatal hernia   HYSTEROSCOPY WITH D & C N/A 03/06/2019   Procedure: DILATATION AND CURETTAGE /HYSTEROSCOPY;  Surgeon: Tilda Burrow, MD;  Location: [facility];  Service:  Gynecology;  Laterality: N/A;   MALONEY DILATION N/A 11/11/2017   Procedure: Elease Hashimoto DILATION;  Surgeon: Corbin Ade, MD;  Location: [facility];  Service: Endoscopy;  Laterality: N/A;   POLYPECTOMY N/A 03/06/2019   Procedure: POLYPECTOMY ( REMOVAL OF ENDOMETRIAL POLYP);  Surgeon: Tilda Burrow, MD;  Location: [facility];  Service: Gynecology;  Laterality: N/A;   POLYPECTOMY  04/15/2021   Procedure: POLYPECTOMY;  Surgeon: Corbin Ade, MD;  Location: [facility];  Service: Endoscopy;;   TUBAL LIGATION      Social History   Socioeconomic History   Marital status: Single    Spouse name: Not on file   Number of children: 3   Years of education: Not on file   Highest education level: Not on file  Occupational History   Occupation: Public affairs consultant: [employer]  Tobacco Use   Smoking status: Former    Current packs/day: 0.00    Average packs/day: 0.3 packs/day for 35.0 years (8.8 ttl pk-yrs)    Types: Cigarettes    Start date: 12/31/1977    Quit date: 12/31/2012    Years since quitting: 10.1   Smokeless tobacco: Never  Vaping Use   Vaping status: Never Used  Substance and Sexual Activity   Alcohol use: Not Currently   Drug use: No   Sexual activity: Not Currently    Partners: Male    Birth control/protection: Post-menopausal, Surgical    Comment: tubal  Other Topics Concern   Not on file  Social History Narrative   Eats all food groups.    Wears seatbelt.    Has 3 children.   Prior smoker.   Attends church.    Used to work in Designer, fashion/clothing.    Drives.   Enjoys going out with family.    Right handed   One story home   Social Determinants of Health   Financial Resource Strain: Low Risk  (06/01/2022)   Overall Financial Resource Strain (CARDIA)    Difficulty of Paying Living Expenses: Not hard at all  Food Insecurity: No Food Insecurity (06/01/2022)   Hunger Vital Sign    Worried About Running Out of Food in the Last Year: Never true    Ran Out of  Food in the Last Year: Never true  Transportation Needs: No Transportation Needs (06/01/2022)   PRAPARE - Administrator, Civil Service (Medical): No    Lack of Transportation (Non-Medical): No  Physical Activity: Sufficiently Active (06/01/2022)   Exercise Vital Sign    Days of Exercise per Week: 5 days    Minutes of Exercise per Session: 30 min  Stress: No Stress Concern Present (06/01/2022)   [employer] of Occupational Health - Occupational Stress Questionnaire    Feeling of Stress : Not at all  Social Connections: Moderately Integrated (06/01/2022)   Social Connection and Isolation Panel [NHANES]    Frequency of Communication with Friends and Family: More than three times a week  Frequency of Social Gatherings with Friends and Family: Three times a week    Attends Religious Services: More than 4 times per year    Active Member of Clubs or Organizations: Yes    Attends Banker Meetings: More than 4 times per year    Marital Status: Never married  Intimate Partner Violence: Not At Risk (06/01/2022)   Humiliation, Afraid, Rape, and Kick questionnaire    Fear of Current or Ex-Partner: No    Emotionally Abused: No    Physically Abused: No    Sexually Abused: No    Family History  Problem Relation Age of Onset   Diabetes Other    Diabetes Mother    Heart disease Mother    Heart disease Father    Diabetes Sister    Diabetes Brother    Hypertension Brother    Diabetes Daughter    Hypertension Maternal Grandmother    Stroke Maternal Grandfather    Early death Paternal Grandfather    Colon cancer Neg Hx    Liver disease Neg Hx     Current Outpatient Medications  Medication Sig Dispense Refill   acetaminophen (TYLENOL) 500 MG tablet Take 500 mg by mouth every 6 (six) hours as needed for moderate pain or headache.     alendronate (FOSAMAX) 70 MG tablet Take 1 tablet (70 mg total) by mouth every 7 (seven) days. Take with a full glass of water on an  empty stomach. 4 tablet 11   ALPRAZolam (XANAX) 0.5 MG tablet TAKE 1 TABLET(0.5 MG) BY MOUTH TWICE DAILY AS NEEDED FOR ANXIETY 180 tablet 0   aspirin EC 81 MG tablet Take 81 mg by mouth at bedtime.      Calcium 500 MG tablet Take 500 mg by mouth in the morning and at bedtime.     carvedilol (COREG) 3.125 MG tablet TAKE 1 TABLET(3.125 MG) BY MOUTH TWICE DAILY 180 tablet 3   cholecalciferol (VITAMIN D3) 25 MCG (1000 UT) tablet Take 2,000 Units by mouth daily.     Coenzyme Q10 (COQ10) 100 MG CAPS Take 100 mg by mouth daily.     CRANBERRY PO Take 1 each by mouth 2 (two) times a week. Chewables     Cyanocobalamin (B-12) 2500 MCG TABS Take 2,500 mcg by mouth daily.     dexlansoprazole (DEXILANT) 60 MG capsule Take 1 capsule (60 mg total) by mouth daily. 30 capsule 11   doxazosin (CARDURA) 4 MG tablet TAKE 1 TABLET(4 MG) BY MOUTH DAILY 30 tablet 1   EPINEPHrine 0.3 mg/0.3 mL IJ SOAJ injection Inject 0.3 mg into the muscle as needed for anaphylaxis. 1 each 1   fluticasone (FLONASE) 50 MCG/ACT nasal spray Place 2 sprays into both nostrils daily.     gabapentin (NEURONTIN) 300 MG capsule Take 1 capsule (300 mg total) by mouth at bedtime. (Patient not taking: Reported on 03/09/2023) 90 capsule 3   hydroquinone 4 % cream Apply 1 application topically 2 (two) times daily as needed (dark spots).     hydrOXYzine (VISTARIL) 25 MG capsule TAKE 1 CAPSULE(25 MG) BY MOUTH EVERY 8 HOURS AS NEEDED 90 capsule 2   ipratropium (ATROVENT) 0.03 % nasal spray INSTILL 2 SPRAYS IN EACH NOSTRIL EVERY 12 HOURS 30 mL 12   loratadine (CLARITIN) 10 MG tablet Take 10 mg by mouth daily as needed for allergies.     MAGNESIUM PO Take 330 mg by mouth daily.     Omega-3 Fatty Acids (FISH OIL) 1000 MG CAPS Take  1,000 mg by mouth 3 (three) times a week.     ondansetron (ZOFRAN) 4 MG tablet TAKE 1 TABLET(4 MG) BY MOUTH EVERY 8 HOURS AS NEEDED FOR NAUSEA OR VOMITING 30 tablet 0   Plecanatide (TRULANCE) 3 MG TABS Take 3 mg by mouth daily.  30 tablet 11   rosuvastatin (CRESTOR) 10 MG tablet TAKE 1 TABLET(10 MG) BY MOUTH DAILY 90 tablet 0   sodium chloride (OCEAN) 0.65 % SOLN nasal spray Place 1 spray into both nostrils as needed for congestion.     spironolactone (ALDACTONE) 25 MG tablet TAKE 1 TABLET(25 MG) BY MOUTH DAILY 90 tablet 0   triamcinolone cream (KENALOG) 0.1 % Apply 1 Application topically 2 (two) times daily. 30 g 0   valsartan (DIOVAN) 320 MG tablet TAKE 1 TABLET BY MOUTH EVERY DAY 90 tablet 1   vitamin E 400 UNIT capsule Take 400 Units by mouth 3 (three) times a week.     zinc gluconate 50 MG tablet Take 50 mg by mouth daily.     No current facility-administered medications for this visit.    Allergies  Allergen Reactions   Ciprofloxacin Shortness Of Breath   Latex Other (See Comments)    Burns skin   Vitamin C [Bioflavonoid Products] Itching     REVIEW OF SYSTEMS:  [X]  denotes positive finding, [ ]  denotes negative finding Cardiac  Comments:  Chest pain or chest pressure:    Shortness of breath upon exertion:    Short of breath when lying flat:    Irregular heart rhythm:        Vascular    Pain in calf, thigh, or hip brought on by ambulation:    Pain in feet at night that wakes you up from your sleep:     Blood clot in your veins:    Leg swelling:         Pulmonary    Oxygen at home:    Productive cough:     Wheezing:         Neurologic    Sudden weakness in arms or legs:     Sudden numbness in arms or legs:     Sudden onset of difficulty speaking or slurred speech:    Temporary loss of vision in one eye:     Problems with dizziness:         Gastrointestinal    Blood in stool:     Vomited blood:         Genitourinary    Burning when urinating:     Blood in urine:        Psychiatric    Major depression:         Hematologic    Bleeding problems:    Problems with blood clotting too easily:        Skin    Rashes or ulcers:        Constitutional    Fever or chills:       PHYSICAL EXAMINATION:  Vitals:   03/09/23 1114  BP: (!) 158/71  Pulse: 68  Temp: 98.1 F (36.7 C)  SpO2: 98%  Weight: 160 lb 12.8 oz (72.9 kg)  Height: 5\' 6"  (1.676 m)    General:  WDWN in NAD; vital signs documented above Gait: Normal HENT: WNL, normocephalic Pulmonary: normal non-labored breathing , without wheezing Cardiac: regular HR; bilateral carotid bruits Abdomen: soft, NT, no masses Vascular Exam/Pulses: 2 + femoral pulses bilaterally, Left DP palpable. Unable to palpate right DP  Extremities: without ischemic changes, without Gangrene , without cellulitis; without open wounds;  Musculoskeletal: no muscle wasting or atrophy  Neurologic: A&O X 3 Psychiatric:  The pt has Normal affect.   Non-Invasive Vascular Imaging:   +-------+-----------+-----------+------------+------------+  ABI/TBIToday's ABIToday's TBIPrevious ABIPrevious TBI  +-------+-----------+-----------+------------+------------+  Right 0.62       0.49       0.55        0.44          +-------+-----------+-----------+------------+------------+  Left  0.81       0.55       0.80        0.46          +-------+-----------+-----------+------------+------------+  Toe pressure of 69 on Right. Monophasic flow Toe pressure of 78 on left. Biphasic flow    VAS US Carotid duplex: Summary:  Right Carotid: Velocities in the right ICA are consistent with a 60-79% stenosis. The ECA appears >50% stenosed.   Left Carotid: Velocities in the left ICA are consistent with a 40-59% stenosis.   Vertebrals: Bilateral vertebral arteries demonstrate antegrade flow.  Subclavians: Normal flow hemodynamics were seen in bilateral subclavian arteries.   ASSESSMENT/PLAN:: 67 y.o. female presents for follow up of carotid artery stenosis and PAD. She has right ICA stenosis measured 60 to 79% and left ICA stenosis measured 1 to 39%. She has no prior history of stroke or TIA. She has a lot of vague symptoms which  are hard to differentiate but are not typical for carotid artery disease. I recommend that she continue to follow up with her Neurologist for further evaluation of this. She has no clear TIA or stroke like symptoms. She does describe some claudication symptoms in her RLE. These are not disabling. I have encouraged her to continue to exercise and do her walking regimen to help promote collaterals - Her ABIs today are essentially unchanged. Right is more dampened then left lower extremity - Carotid duplex today shows stable right ICA stenosis of 60-79%. Left ICA stenosis has increased slightly and by duplex is 40-59% today. Normal flow in the vertebral and subclavian arteries bilaterally - Continue Aspirin, Statin - Discussed importance of adequate blood pressure control - Reviewed signs and symptoms of TIA/ Stroke and she understand should this occur to seek immediate medical attention - She will follow up in 6 months with repeat ABI and Carotid duplex  Graceann Congress, PA-C [facility] [phone]  Clinic MD:   Randie Heinz

## 2023-03-14 ENCOUNTER — Telehealth: Payer: Self-pay | Admitting: *Deleted

## 2023-03-14 NOTE — Telephone Encounter (Signed)
-----   Message from Anabel Bene sent at 03/14/2023  9:56 AM EST ----- This patient LM to cancel procedure.  I put it in the depot.  Thanks,

## 2023-03-14 NOTE — Telephone Encounter (Signed)
Mychart message sent to patient if she wanted to reschedule

## 2023-03-16 ENCOUNTER — Encounter (HOSPITAL_COMMUNITY): Payer: Self-pay | Admitting: *Deleted

## 2023-03-16 ENCOUNTER — Other Ambulatory Visit: Payer: Self-pay

## 2023-03-16 ENCOUNTER — Emergency Department (HOSPITAL_COMMUNITY)
Admission: EM | Admit: 2023-03-16 | Discharge: 2023-03-17 | Disposition: A | Discharge status: Home / Self Care | Payer: Medicare Other | Attending: Emergency Medicine | Admitting: Emergency Medicine

## 2023-03-16 DIAGNOSIS — I6611 Occlusion and stenosis of right anterior cerebral artery: Secondary | ICD-10-CM | POA: Diagnosis not present

## 2023-03-16 DIAGNOSIS — I2510 Atherosclerotic heart disease of native coronary artery without angina pectoris: Secondary | ICD-10-CM | POA: Insufficient documentation

## 2023-03-16 DIAGNOSIS — I672 Cerebral atherosclerosis: Secondary | ICD-10-CM | POA: Diagnosis not present

## 2023-03-16 DIAGNOSIS — I10 Essential (primary) hypertension: Secondary | ICD-10-CM | POA: Diagnosis not present

## 2023-03-16 DIAGNOSIS — R519 Headache, unspecified: Secondary | ICD-10-CM | POA: Diagnosis not present

## 2023-03-16 DIAGNOSIS — I6501 Occlusion and stenosis of right vertebral artery: Secondary | ICD-10-CM | POA: Diagnosis not present

## 2023-03-16 MED ORDER — PROCHLORPERAZINE EDISYLATE 10 MG/2ML IJ SOLN
5.0000 mg | Freq: Once | INTRAMUSCULAR | Status: DC
  Filled 2023-03-16: qty 2

## 2023-03-16 MED ORDER — DIPHENHYDRAMINE HCL 50 MG/ML IJ SOLN
25.0000 mg | Freq: Once | INTRAMUSCULAR | Status: DC
  Filled 2023-03-16: qty 1

## 2023-03-16 MED ORDER — CARVEDILOL 3.125 MG PO TABS
3.1250 mg | ORAL_TABLET | Freq: Once | ORAL | Status: DC
  Filled 2023-03-16: qty 1

## 2023-03-16 MED ORDER — OXYCODONE-ACETAMINOPHEN 5-325 MG PO TABS
1.0000 | ORAL_TABLET | Freq: Once | ORAL | Status: AC
  Administered 2023-03-16: 1 via ORAL
  Filled 2023-03-16: qty 1

## 2023-03-16 NOTE — ED Triage Notes (Signed)
Since July the pt has had face pain withburning she has been seen by her doctor and she has a regular appointment with doctor this Friday but the pt report that she cannot take the pain anymore  she also has a burning sensation to her lt shoulder and back since the 1st of november

## 2023-03-16 NOTE — ED Provider Notes (Signed)
[facility]-EMERGENCY DEPT [facility] Emergency Department Provider Note MRN:  [id number]  Arrival date & time: 03/17/23     Chief Complaint   Headache History of Present Illness   [name] is a 67 y.o. year-old female with a history of hypertension, hyperlipidemia, carotid artery disease presenting to the ED with chief complaint of headache.  Chronic headaches for the past year or so, getting worse over the past month or 2.  Pain behind the left eye with radiation of burning or throbbing pain into the face, down to the neck and left upper arm.  Pain was too intense at home this evening, here for evaluation.  Denies numbness or weakness to the arms or legs, no chest pain or shortness of breath, no abdominal pain, no visual change or vision loss, no trouble with speech or swallowing.  Review of Systems  A thorough review of systems was obtained and all systems are negative except as noted in the HPI and PMH.   Patient's Health History    Past Medical History:  Diagnosis Date   Anxiety    Carotid stenosis, asymptomatic, bilateral    Cataract    Chronic left shoulder pain    Headache    Hiatal hernia    small   Hyperlipidemia    Hypertension    Osteoporosis    Pre-diabetes    Schatzki's ring    Seasonal allergies    Tremor     Past Surgical History:  Procedure Laterality Date   CARDIAC CATHETERIZATION  2005   "Ms. Kluck has essentially normal coronary arteries and normal left ventricular function.  She will be treated empirically with anti-reflux measures"   CATARACT EXTRACTION, BILATERAL     CHOLECYSTECTOMY     COLONOSCOPY    06/16/2004   YQI:[id number] rectum/Diminutive polyps of the splenic flexure and the cecum/The remainder of the colonic mucosa appeared normal pathology (hyperplastic polyps).   COLONOSCOPY N/A 08/07/2015   Dr. Jena Gauss: 6 mm descending colon tubular adenoma, multiple medium-mouthed diverticula in sigmoid colon. surveillance 2022   COLONOSCOPY N/A  04/15/2021   Procedure: COLONOSCOPY;  Surgeon: Corbin Ade, MD;  [facility];  Service: Endoscopy;  Laterality: N/A;  9:30am   ESOPHAGOGASTRODUODENOSCOPY  09/11/2001   ZDG:[id number] ring otherwise normal esophagus/Normal stomach/Normal D1 and D2 status post passage of a 56 French Maloney dilator   ESOPHAGOGASTRODUODENOSCOPY N/A 11/11/2017   Procedure: ESOPHAGOGASTRODUODENOSCOPY (EGD);  Surgeon: Corbin Ade, MD; mild Schatzki ring s/p dilation and mild erosive gastropathy.   ESOPHAGOGASTRODUODENOSCOPY (EGD) WITH ESOPHAGEAL DILATION N/A 01/25/2013   Dr. Jena Gauss- schatzki's ring- 54 F dilation. small hiatal hernia   HYSTEROSCOPY WITH D & C N/A 03/06/2019   Procedure: DILATATION AND CURETTAGE /HYSTEROSCOPY;  Surgeon: Tilda Burrow, MD;  Location: [facility] ORS;  Service: Gynecology;  Laterality: N/A;   MALONEY DILATION N/A 11/11/2017   Procedure: Elease Hashimoto DILATION;  Surgeon: Corbin Ade, MD;  Location[facility];  Service: Endoscopy;  Laterality: N/A;   POLYPECTOMY N/A 03/06/2019   Procedure: POLYPECTOMY ( REMOVAL OF ENDOMETRIAL POLYP);  Surgeon: Tilda Burrow, MD;  Location: [facility] OR[facility];  Service: Gynecology;  Laterality: N/A;   POLYPECTOMY  04/15/2021   Procedure: POLYPECTOMY;  Surgeon: Corbin Ade, MD;  Location[facility];  Service: Endoscopy;;   TUBAL LIGATION      Family History  Problem Relation Age of Onset   Diabetes Other    Diabetes Mother    Heart disease Mother    Heart disease  Father    Diabetes Sister    Diabetes Brother    Hypertension Brother    Diabetes Daughter    Hypertension Maternal Grandmother    Stroke Maternal Grandfather    Early death Paternal Grandfather    Colon cancer Neg Hx    Liver disease Neg Hx     Social History   Socioeconomic History   Marital status: Single    Spouse name: Not on file   Number of children: 3   Years of education: Not on file   Highest education level: Not on file  Occupational History   Occupation:  Public affairs consultant: [employer]  Tobacco Use   Smoking status: Former    Current packs/day: 0.00    Average packs/day: 0.3 packs/day for 35.0 years (8.8 ttl pk-yrs)    Types: Cigarettes    Start date: 12/31/1977    Quit date: 12/31/2012    Years since quitting: 10.2   Smokeless tobacco: Never  Vaping Use   Vaping status: Never Used  Substance and Sexual Activity   Alcohol use: Not Currently   Drug use: No   Sexual activity: Not Currently    Partners: Male    Birth control/protection: Post-menopausal, Surgical    Comment: tubal  Other Topics Concern   Not on file  Social History Narrative   Eats all food groups.    Wears seatbelt.    Has 3 children.   Prior smoker.   Attends church.    Used to work in Designer, fashion/clothing.    Drives.   Enjoys going out with family.    Right handed   One story home   Social Determinants of Health   Financial Resource Strain: Low Risk  (06/01/2022)   Overall Financial Resource Strain (CARDIA)    Difficulty of Paying Living Expenses: Not hard at all  Food Insecurity: No Food Insecurity (06/01/2022)   Hunger Vital Sign    Worried About Running Out of Food in the Last Year: Never true    Ran Out of Food in the Last Year: Never true  Transportation Needs: No Transportation Needs (06/01/2022)   PRAPARE - Administrator, Civil Service (Medical): No    Lack of Transportation (Non-Medical): No  Physical Activity: Sufficiently Active (06/01/2022)   Exercise Vital Sign    Days of Exercise per Week: 5 days    Minutes of Exercise per Session: 30 min  Stress: No Stress Concern Present (06/01/2022)   [employer] of Occupational Health - Occupational Stress Questionnaire    Feeling of Stress : Not at all  Social Connections: Moderately Integrated (06/01/2022)   Social Connection and Isolation Panel [NHANES]    Frequency of Communication with Friends and Family: More than three times a week    Frequency of Social Gatherings with  Friends and Family: Three times a week    Attends Religious Services: More than 4 times per year    Active Member of Clubs or Organizations: Yes    Attends Banker Meetings: More than 4 times per year    Marital Status: Never married  Intimate Partner Violence: Not At Risk (06/01/2022)   Humiliation, Afraid, Rape, and Kick questionnaire    Fear of Current or Ex-Partner: No    Emotionally Abused: No    Physically Abused: No    Sexually Abused: No     Physical Exam   Vitals:   03/17/23 0000 03/17/23 0008  BP: (!) 157/68   Pulse: 61  Resp: 12   Temp:  97.7 F (36.5 C)  SpO2: 99%     CONSTITUTIONAL: Well-appearing, NAD NEURO/PSYCH:  Alert and oriented x 3, normal and symmetric strength and sensation, normal coordination, normal speech EYES:  eyes equal and reactive, normal extraocular movements, symmetric visual acuity, no visual field cuts, no nystagmus ENT/NECK:  no LAD, no JVD CARDIO: Regular rate, well-perfused, normal S1 and S2 PULM:  CTAB no wheezing or rhonchi GI/GU:  non-distended, non-tender MSK/SPINE:  No gross deformities, no edema SKIN:  no rash, atraumatic   *Additional and/or pertinent findings included in MDM below  Diagnostic and Interventional Summary    EKG Interpretation Date/Time:  Wednesday March 16 2023 23:51:06 EST Ventricular Rate:  65 PR Interval:  183 QRS Duration:  85 QT Interval:  456 QTC Calculation: 475 R Axis:   67  Text Interpretation: Sinus rhythm Nonspecific T abnormalities, lateral leads Confirmed by Kennis Carina [phone]) on 03/17/2023 12:01:57 AM       Labs Reviewed  CBC  BASIC METABOLIC PANEL  SEDIMENTATION RATE    CT ANGIO HEAD NECK W WO CM  Final Result      Medications  oxyCODONE-acetaminophen (PERCOCET/ROXICET) 5-325 MG per tablet 1 tablet (1 tablet Oral Given 03/16/23 2000)  iohexol (OMNIPAQUE) 350 MG/ML injection 75 mL (75 mLs Intravenous Contrast Given 03/17/23 0055)     Procedures  /  Critical  Care Procedures  ED Course and Medical Decision Making  Initial Impression and Ddx Favoring primary headache disorder such as migraine.  Per chart review patient has had symptoms such as this for over a year.  Had an MRI back in May 2023 for headache with left arm numbness/weakness and the MRI was normal.  Had a CT head 4 months ago for similar headache which was normal.  Here tonight because the pain was just worse.  CTA back in 2023 did show carotid artery disease.  Pain this evening described as throbbing.  Some of the pain is localized to the left temple and so giant cell arteritis is considered but felt to be unlikely given the chronicity and lack of vision loss.  Obtaining CTA this evening giving the patient's age, throbbing or pulsatile nature of the pain and patient's hypertension here in the emergency department.  Patient has follow-up with neurology in a few days.  Past medical/surgical history that increases complexity of ED encounter: Carotid artery disease, hypertension, hyperlipidemia  Interpretation of Diagnostics I personally reviewed the EKG and my interpretation is as follows: Sinus rhythm, nonspecific findings.  Labs without significant blood count or electrolyte disturbance.  ESR negative.  CTA unchanged.  Patient Reassessment and Ultimate Disposition/Management     Patient continues to look and feel well, blood pressure improved, has follow-up with neurology appropriate for discharge.  Patient management required discussion with the following services or consulting groups:  None  Complexity of Problems Addressed Acute illness or injury that poses threat of life of bodily function  Additional Data Reviewed and Analyzed Further history obtained from: Prior labs/imaging results  Additional Factors Impacting ED Encounter Risk Consideration of hospitalization  Elmer Sow. Pilar Plate, MD [facility] Emergency Medicine [facility] [email or web address]  Final  Clinical Impressions(s) / ED Diagnoses     ICD-10-CM   1. Chronic nonintractable headache, unspecified headache type  R51.9    G89.29       ED Discharge Orders     None        Discharge Instructions Discussed with and Provided to Patient:  Discharge Instructions      You were evaluated in the Emergency Department and after careful evaluation, we did not find any emergent condition requiring admission or further testing in the hospital.  Your exam/testing today was overall reassuring.  Keep your follow-up with neurology to discuss your symptoms.  Please return to the Emergency Department if you experience any worsening of your condition.  Thank you for allowing [place] to be a part of your care.        Sabas Sous, MD 03/17/23 [phone]

## 2023-03-17 ENCOUNTER — Emergency Department (HOSPITAL_COMMUNITY): Payer: Medicare Other

## 2023-03-17 DIAGNOSIS — I6611 Occlusion and stenosis of right anterior cerebral artery: Secondary | ICD-10-CM | POA: Diagnosis not present

## 2023-03-17 DIAGNOSIS — I672 Cerebral atherosclerosis: Secondary | ICD-10-CM | POA: Diagnosis not present

## 2023-03-17 DIAGNOSIS — I6501 Occlusion and stenosis of right vertebral artery: Secondary | ICD-10-CM | POA: Diagnosis not present

## 2023-03-17 DIAGNOSIS — R519 Headache, unspecified: Secondary | ICD-10-CM | POA: Diagnosis not present

## 2023-03-17 LAB — BASIC METABOLIC PANEL
Anion gap: 8 (ref 5–15)
BUN: 8 mg/dL (ref 8–23)
CO2: 25 mmol/L (ref 22–32)
Calcium: 9.4 mg/dL (ref 8.9–10.3)
Chloride: 105 mmol/L (ref 98–111)
Creatinine, Ser: 0.87 mg/dL (ref 0.44–1.00)
GFR, Estimated: >60 mL/min (ref >60)
Glucose, Bld: 97 mg/dL (ref 70–99)
Potassium: 4 mmol/L (ref 3.5–5.1)
Sodium: 138 mmol/L (ref 135–145)

## 2023-03-17 LAB — CBC
HCT: 39.3 % (ref 36.0–46.0)
Hemoglobin: 12.9 g/dL (ref 12.0–15.0)
MCH: 29.8 pg (ref 26.0–34.0)
MCHC: 32.8 g/dL (ref 30.0–36.0)
MCV: 90.8 fL (ref 80.0–100.0)
Platelets: 267 10*3/uL (ref 150–400)
RBC: 4.33 MIL/uL (ref 3.87–5.11)
RDW: 12.4 % (ref 11.5–15.5)
WBC: 8.8 10*3/uL (ref 4.0–10.5)
nRBC: 0 % (ref 0.0–0.2)

## 2023-03-17 LAB — SEDIMENTATION RATE: Sed Rate: 3 mm/h (ref 0–22)

## 2023-03-17 MED ORDER — IOHEXOL 350 MG/ML SOLN
75.0000 mL | Freq: Once | INTRAVENOUS | Status: AC | PRN
  Administered 2023-03-17: 75 mL via INTRAVENOUS

## 2023-03-17 NOTE — ED Notes (Signed)
Patient transported to CT 

## 2023-03-17 NOTE — Discharge Instructions (Signed)
You were evaluated in the Emergency Department and after careful evaluation, we did not find any emergent condition requiring admission or further testing in the hospital.  Your exam/testing today was overall reassuring.  Keep your follow-up with neurology to discuss your symptoms.  Please return to the Emergency Department if you experience any worsening of your condition.  Thank you for allowing [place] to be a part of your care.

## 2023-03-18 ENCOUNTER — Ambulatory Visit: Payer: Medicare Other | Admitting: Family Medicine

## 2023-03-18 ENCOUNTER — Encounter (HOSPITAL_COMMUNITY): Payer: Medicare Other

## 2023-03-18 VITALS — BP 136/82 | HR 76 | Temp 97.8°F | Ht 66.0 in | Wt 163.0 lb

## 2023-03-18 DIAGNOSIS — G43511 Persistent migraine aura without cerebral infarction, intractable, with status migrainosus: Secondary | ICD-10-CM

## 2023-03-18 MED ORDER — PROMETHAZINE HCL 25 MG PO TABS: 25.0000 mg | ORAL_TABLET | Freq: Three times a day (TID) | ORAL | 0 refills | Status: DC | PRN

## 2023-03-18 NOTE — Progress Notes (Signed)
Subjective:    Patient ID: [name], female    DOB: [birth year], 67 y.o.   MRN: [id number]  Patient states that she has been dealing with a left-sided retro-orbital headache for the last 3 months.  She saw my partner who thought she had a sinus infection and tried her on antibiotics however this did not alleviate the headache.  She states that occasionally she will see "lightning bolts" out of the corner of her vision.  She will sometimes have blurry vision.  She will have nausea associated with the headaches.  The headaches will be pounding.  The headaches are located above and behind her left eye.  She went to the emergency room on November 6.  They performed a CT angiogram of the head and neck.  There was no evidence of any sinus disease.  There was no evidence of any arterial occlusion.  They also check lab work and the patient had a sedimentation rate that was 3 which makes giant cell arteritis extremely unlikely.  She is here today for follow-up.  She states that the headaches have been present every day for the last 3 months.  However they have worsened over the last few days prompting today's visit. Past Medical History:  Diagnosis Date   Anxiety    Carotid stenosis, asymptomatic, bilateral    Cataract    Chronic left shoulder pain    Headache    Hiatal hernia    small   Hyperlipidemia    Hypertension    Osteoporosis    Pre-diabetes    Schatzki's ring    Seasonal allergies    Tremor      Past Surgical History:  Procedure Laterality Date   CARDIAC CATHETERIZATION  2005   "Ms. Fleitas has essentially normal coronary arteries and normal left ventricular function.  She will be treated empirically with anti-reflux measures"   CATARACT EXTRACTION, BILATERAL     CHOLECYSTECTOMY     COLONOSCOPY    06/16/2004   WUJ:[id number] rectum/Diminutive polyps of the splenic flexure and the cecum/The remainder of the colonic mucosa appeared normal pathology (hyperplastic polyps).    COLONOSCOPY N/A 08/07/2015   Dr. Jena Gauss: 6 mm descending colon tubular adenoma, multiple medium-mouthed diverticula in sigmoid colon. surveillance 2022   COLONOSCOPY N/A 04/15/2021   Procedure: COLONOSCOPY;  Surgeon: Corbin Ade, MD;  [facility];  Service: Endoscopy;  Laterality: N/A;  9:30am   ESOPHAGOGASTRODUODENOSCOPY  09/11/2001   NWG:[id number] ring otherwise normal esophagus/Normal stomach/Normal D1 and D2 status post passage of a 56 French Maloney dilator   ESOPHAGOGASTRODUODENOSCOPY N/A 11/11/2017   Procedure: ESOPHAGOGASTRODUODENOSCOPY (EGD);  Surgeon: Corbin Ade, MD; mild Schatzki ring s/p dilation and mild erosive gastropathy.   ESOPHAGOGASTRODUODENOSCOPY (EGD) WITH ESOPHAGEAL DILATION N/A 01/25/2013   Dr. Jena Gauss- schatzki's ring- 54 F dilation. small hiatal hernia   HYSTEROSCOPY WITH D & C N/A 03/06/2019   Procedure: DILATATION AND CURETTAGE /HYSTEROSCOPY;  Surgeon: Tilda Burrow, MD;  Location: [facility];  Service: Gynecology;  Laterality: N/A;   MALONEY DILATION N/A 11/11/2017   Procedure: Elease Hashimoto DILATION;  Surgeon: Corbin Ade, MD;  [facility];  Service: Endoscopy;  Laterality: N/A;   POLYPECTOMY N/A 03/06/2019   Procedure: POLYPECTOMY ( REMOVAL OF ENDOMETRIAL POLYP);  Surgeon: Tilda Burrow, MD;  Location: [facility];  Service: Gynecology;  Laterality: N/A;   POLYPECTOMY  04/15/2021   Procedure: POLYPECTOMY;  Surgeon: Corbin Ade, MD;  [facility];  Service: Endoscopy;;  TUBAL LIGATION         Allergies  Allergen Reactions   Ciprofloxacin Shortness Of Breath   Latex Other (See Comments)    Burns skin   Vitamin C [Bioflavonoid Products] Itching   Social History   Socioeconomic History   Marital status: Single    Spouse name: Not on file   Number of children: 3   Years of education: Not on file   Highest education level: 10th grade  Occupational History   Occupation: Public affairs consultant: [employer]   Tobacco Use   Smoking status: Former    Current packs/day: 0.00    Average packs/day: 0.3 packs/day for 35.0 years (8.8 ttl pk-yrs)    Types: Cigarettes    Start date: 12/31/1977    Quit date: 12/31/2012    Years since quitting: 10.2   Smokeless tobacco: Never  Vaping Use   Vaping status: Never Used  Substance and Sexual Activity   Alcohol use: Not Currently   Drug use: No   Sexual activity: Not Currently    Partners: Male    Birth control/protection: Post-menopausal, Surgical    Comment: tubal  Other Topics Concern   Not on file  Social History Narrative   Eats all food groups.    Wears seatbelt.    Has 3 children.   Prior smoker.   Attends church.    Used to work in Designer, fashion/clothing.    Drives.   Enjoys going out with family.    Right handed   One story home   Social Determinants of Health   Financial Resource Strain: Low Risk  (03/18/2023)   Overall Financial Resource Strain (CARDIA)    Difficulty of Paying Living Expenses: Not hard at all  Food Insecurity: No Food Insecurity (03/18/2023)   Hunger Vital Sign    Worried About Running Out of Food in the Last Year: Never true    Ran Out of Food in the Last Year: Never true  Transportation Needs: No Transportation Needs (03/18/2023)   PRAPARE - Administrator, Civil Service (Medical): No    Lack of Transportation (Non-Medical): No  Physical Activity: Insufficiently Active (03/18/2023)   Exercise Vital Sign    Days of Exercise per Week: 3 days    Minutes of Exercise per Session: 30 min  Stress: No Stress Concern Present (03/18/2023)   [employer] of Occupational Health - Occupational Stress Questionnaire    Feeling of Stress : Only a little  Social Connections: Moderately Integrated (03/18/2023)   Social Connection and Isolation Panel [NHANES]    Frequency of Communication with Friends and Family: More than three times a week    Frequency of Social Gatherings with Friends and Family: More than three times a  week    Attends Religious Services: More than 4 times per year    Active Member of [employer] or Organizations: Yes    Attends Engineer, structural: More than 4 times per year    Marital Status: Divorced  Intimate Partner Violence: Not At Risk (06/01/2022)   Humiliation, Afraid, Rape, and Kick questionnaire    Fear of Current or Ex-Partner: No    Emotionally Abused: No    Physically Abused: No    Sexually Abused: No   Family History  Problem Relation Age of Onset   Diabetes Other    Diabetes Mother    Heart disease Mother    Heart disease Father    Diabetes Sister    Diabetes Brother  Hypertension Brother    Diabetes Daughter    Hypertension Maternal Grandmother    Stroke Maternal Grandfather    Early death Paternal Grandfather    Colon cancer Neg Hx    Liver disease Neg Hx       Review of Systems  All other systems reviewed and are negative.      Objective:   Physical Exam Vitals reviewed.  Constitutional:      General: She is not in acute distress.    Appearance: Normal appearance. She is well-developed and normal weight. She is not ill-appearing.  HENT:     Right Ear: Tympanic membrane and ear canal normal.     Left Ear: Tympanic membrane and ear canal normal.     Nose: No congestion or rhinorrhea.     Right Sinus: No maxillary sinus tenderness or frontal sinus tenderness.     Left Sinus: No maxillary sinus tenderness or frontal sinus tenderness.  Eyes:     General: No visual field deficit.    Extraocular Movements: Extraocular movements intact.     Right eye: Normal extraocular motion and no nystagmus.     Left eye: Normal extraocular motion and no nystagmus.     Conjunctiva/sclera: Conjunctivae normal.     Pupils: Pupils are equal, round, and reactive to light.     Right eye: Pupil is round and reactive.     Left eye: Pupil is round and reactive.  Neck:     Vascular: No carotid bruit.  Cardiovascular:     Rate and Rhythm: Normal rate and regular  rhythm.     Heart sounds: Normal heart sounds. No murmur heard.    No friction rub. No gallop.  Pulmonary:     Effort: Pulmonary effort is normal. No respiratory distress.     Breath sounds: Normal breath sounds. No stridor. No wheezing or rales.  Abdominal:     General: Abdomen is flat. Bowel sounds are normal. There is no distension.     Palpations: Abdomen is soft.     Tenderness: There is no abdominal tenderness. There is no guarding.  Musculoskeletal:     Right hand: Normal. No swelling, deformity, tenderness or bony tenderness. Normal range of motion.     Left hand: Normal. No swelling, deformity, tenderness or bony tenderness. Normal range of motion.     Cervical back: Normal range of motion. No rigidity.     Right lower leg: Normal. No swelling, deformity, tenderness or bony tenderness. No edema.     Left lower leg: No deformity, tenderness or bony tenderness.     Right ankle: Normal. No swelling.     Left ankle: Normal. No swelling.  Lymphadenopathy:     Cervical: No cervical adenopathy.  Neurological:     General: No focal deficit present.     Mental Status: She is alert and oriented to person, place, and time. Mental status is at baseline.     Cranial Nerves: No cranial nerve deficit, dysarthria or facial asymmetry.     Sensory: No sensory deficit.     Motor: Tremor present. No weakness or abnormal muscle tone.     Coordination: Romberg sign negative. Coordination normal.     Gait: Gait normal.  Psychiatric:        Mood and Affect: Mood normal.        Behavior: Behavior normal.        Thought Content: Thought content normal.        Judgment: Judgment normal.  Assessment & Plan:  Migraine aura, persistent, intractable, with status migrainosus Given location, certainly giant cell arteritis is on the differential diagnosis.  The ER physician also felt this however they did obtain a sedimentation rate that was completely normal.  This makes giant cell arteritis  highly unlikely.  Furthermore the patient has had the headaches now for 3 months.  I do not suspect that the patient would have giant cell arteritis constantly for 3 months without vision loss.  She does have visual symptoms but it tends to be more like a migraine aura.  I believe that the "lightening bolt" that she describes in the corner of her vision is actually a scintillating line.  This is likely a migraine aura.  I believe that the blurry vision she states that comes and goes is a scotoma.  I certainly believe that the headache which is pulsatile sounds migraine like in nature.  Given the normal CT scan/CT angiogram I do not feel that this patient has any evidence of sinusitis.  Therefore Iwill treat the patient with samples of Nurtec.  She can take 1 tablet/day as needed for headache.  She can use Phenergan 25 mg every 8 hours as needed for nausea.  Recheck on Monday.  If headache is not improving by Monday I would also repeat the sedimentation rate to ensure that this is not changing.  Could consider placing the patient on high-dose steroids if the headache worsens but I do not feel the patient is dealing with temporal arteritis based on the reasons outlined above

## 2023-03-23 ENCOUNTER — Encounter (HOSPITAL_COMMUNITY): Payer: Self-pay

## 2023-03-23 ENCOUNTER — Ambulatory Visit (HOSPITAL_COMMUNITY): Admit: 2023-03-23 | Payer: Medicare Other | Admitting: Internal Medicine

## 2023-03-23 SURGERY — ESOPHAGOGASTRODUODENOSCOPY (EGD) WITH PROPOFOL
Anesthesia: Monitor Anesthesia Care

## 2023-03-24 ENCOUNTER — Other Ambulatory Visit: Payer: Self-pay

## 2023-03-24 ENCOUNTER — Telehealth: Payer: Self-pay

## 2023-03-24 ENCOUNTER — Other Ambulatory Visit: Payer: Self-pay | Admitting: Family Medicine

## 2023-03-24 DIAGNOSIS — I739 Peripheral vascular disease, unspecified: Secondary | ICD-10-CM

## 2023-03-24 DIAGNOSIS — G43511 Persistent migraine aura without cerebral infarction, intractable, with status migrainosus: Secondary | ICD-10-CM

## 2023-03-24 DIAGNOSIS — I6523 Occlusion and stenosis of bilateral carotid arteries: Secondary | ICD-10-CM

## 2023-03-24 MED ORDER — NURTEC 75 MG PO TBDP: 1.0000 | ORAL_TABLET | Freq: Every day | ORAL | 0 refills | Status: DC | PRN

## 2023-03-24 NOTE — Telephone Encounter (Signed)
Copied from CRM [id number]. Topic: Clinical - Medication Question >> Mar 24, 2023 10:12 AM Roswell Nickel wrote: Reason for CRM: Patient calling to let Dr. Tanya Nones  know  that Nurtec is working for her to go ahead send in script. Patient can be reach at  [phone].  FYI- Will send in rx for her

## 2023-04-05 ENCOUNTER — Telehealth: Payer: Self-pay | Admitting: Neurology

## 2023-04-05 NOTE — Telephone Encounter (Signed)
Pt called to scheduled an appt for headaches. Informed pt has been 5 years and would need a referral.

## 2023-04-06 ENCOUNTER — Telehealth: Payer: Self-pay

## 2023-04-06 NOTE — Telephone Encounter (Signed)
Transition Care Management Follow-up Telephone Call Date of discharge and from where: [facility] 11/7 How have you been since you were released from the hospital? Patient is still having headaches and following up with Providers. Patient is having difficulties getting insurance to approve medication to be filled Any questions or concerns? No  Items Reviewed: Did the pt receive and understand the discharge instructions provided? Yes  Medications obtained and verified? No  Other? No  Any new allergies since your discharge? No  Dietary orders reviewed? No Do you have support at home? Yes     Follow up appointments reviewed:  PCP Hospital f/u appt confirmed? Yes Scheduled to see  on  @ . Specialist Hospital f/u appt confirmed? Yes  Scheduled to see  on  @ . Are transportation arrangements needed? No  If their condition worsens, is the pt aware to call PCP or go to the Emergency Dept.? Yes Was the patient provided with contact information for the PCP's office or ED? Yes Was to pt encouraged to call back with questions or concerns? Yes

## 2023-04-10 ENCOUNTER — Other Ambulatory Visit: Payer: Self-pay | Admitting: Family Medicine

## 2023-04-10 DIAGNOSIS — I10 Essential (primary) hypertension: Secondary | ICD-10-CM

## 2023-04-11 ENCOUNTER — Encounter: Payer: Self-pay | Admitting: Physician Assistant

## 2023-04-11 ENCOUNTER — Telehealth: Payer: Self-pay

## 2023-04-11 ENCOUNTER — Ambulatory Visit: Payer: Medicare Other | Admitting: Physician Assistant

## 2023-04-11 ENCOUNTER — Other Ambulatory Visit: Payer: Self-pay | Admitting: Family Medicine

## 2023-04-11 VITALS — BP 138/60 | HR 78 | Resp 18 | Ht 66.0 in | Wt 164.0 lb

## 2023-04-11 DIAGNOSIS — R519 Headache, unspecified: Secondary | ICD-10-CM

## 2023-04-11 DIAGNOSIS — G8929 Other chronic pain: Secondary | ICD-10-CM | POA: Diagnosis not present

## 2023-04-11 MED ORDER — NORTRIPTYLINE HCL 10 MG PO CAPS: 10.0000 mg | ORAL_CAPSULE | Freq: Every day | ORAL | 3 refills | Status: DC

## 2023-04-11 NOTE — Patient Instructions (Addendum)
  Continue BP medicines  Follow up in 3 months  Continue the CoQ, B6 and Mag and tylenol  Continue Nurtec as needed as directed for headaches  Start nortriptyline 10 mg at night  Recommend good stress management  Stay hydrated  Follow with cardiology  and [facility]  for murmurs, carotid bruits, blood pressure.   Limit use of pain relievers to no more than 2 days out of the week.  These medications include acetaminophen, NSAIDs (ibuprofen/Advil/Motrin, naproxen/Aleve, triptans (Imitrex/sumatriptan), Excedrin, and narcotics.  This will help reduce risk of rebound headaches. Be aware of common food triggers:  - Caffeine:  coffee, black tea, cola, Mt. Dew  - Chocolate  - Dairy:  aged cheeses (brie, blue, cheddar, gouda, [place], provolone, [place], Swiss, etc), chocolate milk, buttermilk, sour cream, limit eggs and yogurt  - Nuts, peanut butter  - Alcohol  - Cereals/grains:  FRESH breads (fresh bagels, sourdough, doughnuts), yeast productions  - Processed/canned/aged/cured meats (pre-packaged deli meats, hotdogs)  - MSG/glutamate:  soy sauce, flavor enhancer, pickled/preserved/marinated foods  - Sweeteners:  aspartame (Equal, Nutrasweet).  Sugar and Splenda are okay  - Vegetables:  legumes (lima beans, lentils, snow peas, fava beans, pinto peans, peas, garbanzo beans), sauerkraut, onions, olives, pickles  - Fruit:  avocados, bananas, citrus fruit (orange, lemon, grapefruit), mango  - Other:  Frozen meals, macaroni and cheese Routine exercise Stay adequately hydrated (aim for 64 oz water daily) Keep headache diary Maintain proper stress management Maintain proper sleep hygiene Do not skip meals Consider supplements:  magnesium citrate 400mg  daily, riboflavin 400mg  daily, coenzyme Q10 100mg  three times daily. 50 25   MRI will be at [facility] [phone]

## 2023-04-11 NOTE — Progress Notes (Signed)
[facility] Neurology [facility] Clinic Note - Progress Visit   Date: 04/11/23  [name] MRN: [id number] DOB: 1955/09/11  Reason for visit: Evaluation worsening headaches.  History of Present Illness:  The patient is presenting prior to her scheduled visit on September 2025, as her headaches are worse than before.  She has been seen at the ED with these complaints on 03/16/2023 due to the intensity of it.     Onset:  Any time of the day   Quality:  throbbing , feels tight and pulsating  Intensity: 7-8/10   Location: B temporal across forehead and sometimes in the occipital area. Sometimes a "heartbeat in the face"  Duration: 3-4 hours  Frequency: daily or every other day, worse with anxiety  Associated symptoms: nausea  Vision changes?  During the presentation to the ER on 03/16/2023, she noted "lightning bolts in the corner of her eyes, which may have been consistent with migraine aura. Aura: wiggly lines, every "now and then, maybe 2 times a month"  Activity Does home weekly yoga which helps sometimes Aggravating factors: Sounds and light Relieving factors: trying to calm herself down, breathe, and takes Tylenol and Xanax as needed  Current abortive medications: Tylenol once a week. Recently took Nurtec samples by her PCP, but insurance denied the Rx, has not been taking it for at least 1 week.  Current prophylactic medications: "magnesium, B6, CoQ "  Past abortive medications: gabapentin (increased confusion, grogginess) Past prophylactic medications: Topamax "tired and no energy"    Family history: GF stroke, no family or personal history of headaches or seizures Smoker: no  Alcohol: no  Caffeine: none Sleep: Better  Stress: Her brother and 2 grandkids, lives with her parents and stress to her life   CTA 09/09/2021 personally reviewed, agree with radiology: 1. No emergent finding. 2. At least 70% atheromatous narrowing of the right common carotid  bifurcation and right cavernous ICA. 50% stenosis of the paraclinoid right ICA. 3. 45% narrowing at the right vertebral origin. 4. Mild intracranial branch atherosclerosis.  MRI of the brain 10/02/2021 no acute intracranial process. No evidence of acute or subacute infarct.  CT of the head: Normal head CT, without acute findings.  CT angio of the head 03/16/2023 was negative for acute findings.  No emergent LVO, unchanged multifocal severe stenosis of the right ICA cavernous segment and at least 70% stenosis of the right carotid bifurcation, unchanged mild narrowing of the right vertebral artery origin, it was essentially unchanged from prior in 2023. ESR was negative at 3.  Initial visit 09/2021   Patient is a very pleasant 67 year old woman f initially seen at [facility] neurology for evaluation of essential tremor, currently on metoprolol,  history of hypertension, hyperlipidemia, anxiety, and a history of abdominal aortic aneurysm and prediabetes.  She recently had been seen at the ED on 10/02/2021 with complaints of significant headaches, "jumbled words ", and slight "left hand heaviness with the headaches ".  This last month, her blood pressure has been significantly uncontrolled reaching up to 200s.  She has some floaters in her right eye, otherwise she denies any visual complaints.  She has chronic bilateral foot tingling, but there is no new areas of numbness.  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  Also seen, 45% narrowing of the right  vertebral origin.  Pending on these findings, an MRI of the brain without contrast was performed, negative for stroke or other abnormality is.  It was suspected that these symptoms were due to hypertensive emergency, which had resolved at the time of neurological  evaluation.  She was discharged on gabapentin 300 mg twice daily, but the patient reduced it to 300 mg at night, because of increased sleepiness.  Patient is on statin, metoprolol, amlodipine, and clonidine as needed as well. Denies any history of TIA. Denies vertigo dizziness or vision changes. Denies dysphagia. No confusion Denies any fever or chills, or night sweats. No tobacco. No new meds or hormonal supplements. Does take a regular ASA a day, with no other antiplatelets or anticoagulants.Denies any recent long distance trips or recent surgeries. No sick contacts. No new stressors present in personal life.  Patient is compliant with his medications.Patient is active. She is here in follow-up.  She is alone during this visit.  Out-side paper records, electronic medical record, and images have been reviewed where available and summarized as:  Lab Results  Component Value Date   HGBA1C 6.2 (H) 12/22/2022   Lab Results  Component Value Date   VITAMINB12 >2,000 (H) 01/20/2021   Lab Results  Component Value Date   TSH 1.84 10/29/2019   Lab Results  Component Value Date   ESRSEDRATE 3 03/16/2023    Past Medical History:  Diagnosis Date   Anxiety    Carotid stenosis, asymptomatic, bilateral    Cataract    Chronic left shoulder pain    Headache    Hiatal hernia    small   Hyperlipidemia    Hypertension    Osteoporosis    Pre-diabetes    Schatzki's ring    Seasonal allergies    Tremor     Past Surgical History:  Procedure Laterality Date   CARDIAC CATHETERIZATION  2005   "Ms. Keck has essentially normal coronary arteries and normal left ventricular function.  She will be treated empirically with anti-reflux measures"   CATARACT EXTRACTION, BILATERAL     CHOLECYSTECTOMY     COLONOSCOPY    06/16/2004   [id number] rectum/Diminutive polyps of the splenic flexure and the cecum/The remainder of the colonic mucosa appeared normal pathology (hyperplastic polyps).   COLONOSCOPY  N/A 08/07/2015   Dr. Jena Gauss: 6 mm descending colon tubular adenoma, multiple medium-mouthed diverticula in sigmoid colon. surveillance 2022   COLONOSCOPY N/A 04/15/2021   Procedure: COLONOSCOPY;  Surgeon: Corbin Ade, MD;  Location: [facility];  Service: Endoscopy;  Laterality: N/A;  9:30am   ESOPHAGOGASTRODUODENOSCOPY  09/11/2001   ONG:EXBMWUXLK ring otherwise normal esophagus/Normal stomach/Normal D1 and D2 status post passage of a 56 French Maloney dilator   ESOPHAGOGASTRODUODENOSCOPY N/A 11/11/2017   Procedure: ESOPHAGOGASTRODUODENOSCOPY (EGD);  Surgeon: Corbin Ade, MD; mild Schatzki ring s/p dilation and mild erosive gastropathy.   ESOPHAGOGASTRODUODENOSCOPY (EGD) WITH ESOPHAGEAL DILATION N/A 01/25/2013   Dr. Jena Gauss- schatzki's ring- 54 F dilation. small hiatal hernia   HYSTEROSCOPY WITH D & C N/A 03/06/2019   Procedure: DILATATION AND CURETTAGE /HYSTEROSCOPY;  Surgeon: Tilda Burrow, MD;  Location: [facility];  Service: Gynecology;  Laterality: N/A;   MALONEY DILATION N/A 11/11/2017   Procedure: Elease Hashimoto DILATION;  Surgeon: Corbin Ade, MD;  Location: [facility];  Service: Endoscopy;  Laterality: N/A;   POLYPECTOMY N/A 03/06/2019   Procedure: POLYPECTOMY ( REMOVAL OF ENDOMETRIAL POLYP);  Surgeon: Tilda Burrow, MD;  Location: [facility];  Service: Gynecology;  Laterality:  N/A;   POLYPECTOMY  04/15/2021   Procedure: POLYPECTOMY;  Surgeon: Corbin Ade, MD;  Location: [facility];  Service: Endoscopy;;   TUBAL LIGATION       Medications:  Outpatient Encounter Medications as of 04/11/2023  Medication Sig   nortriptyline (PAMELOR) 10 MG capsule Take 1 capsule (10 mg total) by mouth at bedtime.   acetaminophen (TYLENOL) 500 MG tablet Take 500 mg by mouth every 6 (six) hours as needed for moderate pain or headache.   alendronate (FOSAMAX) 70 MG tablet Take 1 tablet (70 mg total) by mouth every 7 (seven) days. Take with a full glass of water on an empty stomach.   ALPRAZolam  (XANAX) 0.5 MG tablet TAKE 1 TABLET(0.5 MG) BY MOUTH TWICE DAILY AS NEEDED FOR ANXIETY   aspirin EC 81 MG tablet Take 81 mg by mouth at bedtime.    Calcium 500 MG tablet Take 500 mg by mouth in the morning and at bedtime.   carvedilol (COREG) 3.125 MG tablet TAKE 1 TABLET(3.125 MG) BY MOUTH TWICE DAILY   cholecalciferol (VITAMIN D3) 25 MCG (1000 UT) tablet Take 2,000 Units by mouth daily.   Coenzyme Q10 (COQ10) 100 MG CAPS Take 100 mg by mouth daily.   CRANBERRY PO Take 1 each by mouth 2 (two) times a week. Chewables   Cyanocobalamin (B-12) 2500 MCG TABS Take 2,500 mcg by mouth daily.   dexlansoprazole (DEXILANT) 60 MG capsule Take 1 capsule (60 mg total) by mouth daily.   doxazosin (CARDURA) 4 MG tablet TAKE 1 TABLET(4 MG) BY MOUTH DAILY   EPINEPHrine 0.3 mg/0.3 mL IJ SOAJ injection Inject 0.3 mg into the muscle as needed for anaphylaxis.   fluticasone (FLONASE) 50 MCG/ACT nasal spray Place 2 sprays into both nostrils daily.   gabapentin (NEURONTIN) 300 MG capsule Take 1 capsule (300 mg total) by mouth at bedtime.   hydroquinone 4 % cream Apply 1 application topically 2 (two) times daily as needed (dark spots).   hydrOXYzine (VISTARIL) 25 MG capsule TAKE 1 CAPSULE(25 MG) BY MOUTH EVERY 8 HOURS AS NEEDED   ipratropium (ATROVENT) 0.03 % nasal spray INSTILL 2 SPRAYS IN EACH NOSTRIL EVERY 12 HOURS   loratadine (CLARITIN) 10 MG tablet Take 10 mg by mouth daily as needed for allergies.   MAGNESIUM PO Take 330 mg by mouth daily.   Omega-3 Fatty Acids (FISH OIL) 1000 MG CAPS Take 1,000 mg by mouth 3 (three) times a week.   ondansetron (ZOFRAN) 4 MG tablet TAKE 1 TABLET(4 MG) BY MOUTH EVERY 8 HOURS AS NEEDED FOR NAUSEA OR VOMITING   Plecanatide (TRULANCE) 3 MG TABS Take 3 mg by mouth daily.   promethazine (PHENERGAN) 25 MG tablet Take 1 tablet (25 mg total) by mouth every 8 (eight) hours as needed for nausea or vomiting.   Rimegepant Sulfate (NURTEC) 75 MG TBDP Take 1 tablet (75 mg total) by mouth  daily as needed.   rosuvastatin (CRESTOR) 10 MG tablet TAKE 1 TABLET(10 MG) BY MOUTH DAILY   sodium chloride (OCEAN) 0.65 % SOLN nasal spray Place 1 spray into both nostrils as needed for congestion.   spironolactone (ALDACTONE) 25 MG tablet TAKE 1 TABLET(25 MG) BY MOUTH DAILY   triamcinolone cream (KENALOG) 0.1 % Apply 1 Application topically 2 (two) times daily.   valsartan (DIOVAN) 320 MG tablet TAKE 1 TABLET BY MOUTH EVERY DAY   vitamin E 400 UNIT capsule Take 400 Units by mouth 3 (three) times a week.   zinc gluconate 50  MG tablet Take 50 mg by mouth daily.   No facility-administered encounter medications on file as of 04/11/2023.    Allergies:  Allergies  Allergen Reactions   Ciprofloxacin Shortness Of Breath   Latex Other (See Comments)    Burns skin   Vitamin C [Bioflavonoid Products] Itching    Family History: Family History  Problem Relation Age of Onset   Diabetes Other    Diabetes Mother    Heart disease Mother    Heart disease Father    Diabetes Sister    Diabetes Brother    Hypertension Brother    Diabetes Daughter    Hypertension Maternal Grandmother    Stroke Maternal Grandfather    Early death Paternal Grandfather    Colon cancer Neg Hx    Liver disease Neg Hx     Social History: Social History   Tobacco Use   Smoking status: Former    Current packs/day: 0.00    Average packs/day: 0.3 packs/day for 35.0 years (8.8 ttl pk-yrs)    Types: Cigarettes    Start date: 12/31/1977    Quit date: 12/31/2012    Years since quitting: 10.2   Smokeless tobacco: Never  Vaping Use   Vaping status: Never Used  Substance Use Topics   Alcohol use: Not Currently   Drug use: No   Social History   Social History Narrative   Eats all food groups.    Wears seatbelt.    Has 3 children.   Prior smoker.   Attends church.    Used to work in Designer, fashion/clothing.    Drives.   Enjoys going out with family.    Right handed   One story home    Vital Signs:  BP 138/60    Pulse 78   Resp 18   Ht 5\' 6"  (1.676 m)   Wt 164 lb (74.4 kg)   SpO2 98%   BMI 26.47 kg/m    General Medical Exam:   General:  Well appearing, comfortable.   Eyes/ENT: see cranial nerve examination.   Neck: Bilateral soft carotid bruits. Respiratory:  Clear to auscultation, good air entry bilaterally.   Cardiac:  Regular rate and rhythm, soft murmur.   Extremities:  No deformities, edema, or skin discoloration.  Skin:  No rashes or lesions.  Neurological Exam: MENTAL STATUS including orientation to time, place, person, recent and remote memory, attention span and concentration, language, and fund of knowledge is normal.  Speech is not dysarthric.  CRANIAL NERVES: II:  No visual field defects.  Fundi not visualized III-IV-VI: Pupils equal round and reactive to light.  Normal conjugate, extra-ocular eye movements in all directions of gaze.  No nystagmus.  No ptosis.   V:  Normal facial sensation.    VII:  Normal facial symmetry and movements.   VIII:  Normal hearing and vestibular function.   IX-X:  Normal palatal movement.   XI:  Normal shoulder shrug and head rotation.   XII:  Normal tongue strength and range of motion, no deviation or fasciculation.  MOTOR:  No atrophy, fasciculations.  Left greater than right very mild tremor worse on intention, no head tremor.  No pronator drift. No cogwheeling   SENSORY:  Normal and symmetric perception of light touch, pinprick, vibration, and proprioception.  Romberg's sign absent.   COORDINATION/GAIT: Normal finger-to- nose-finger and heel-to-shin.  Intact rapid alternating movements bilaterally.  Able to rise from a chair without using arms.  Gait narrow based and stable. Tandem and stressed gait  intact.    IMPRESSION/PLAN:  Headaches,concern for uncontrollable hypertension as the culprit.      For abortive therapy, continue Nurtec 1 tablet a day as needed for headache, as per PCP.May use Tylenol as needed.    Limit use of pain  relievers to no more than 2 days out of week to prevent risk of rebound or medication-overuse headache. For preventative medicine: start nortriptyline 10 mg at night, side effects discussed  MRI brain to look for structural abnormalities and vascular load  Keep headache diary  Exercise, hydration, caffeine cessation, sleep hygiene, monitor for and avoid triggers Continue magnesium citrate 400mg  daily, riboflavin 400mg  daily, and coenzyme Q10 100mg  three times daily Strong monitor of Blood pressure with current med regimen  as per  Cards and PCP Continue Phenergan 25 mg every 8 hours as needed for nausea as per PCP. Follow up in 3 months   Follow-up with [facility] for carotid artery stenosis, she has an appointment in April 2025.  Essential Tremors No significant changes from prior visit, denies any issues  She is not on metoprolol.    Total time spent: 40 mins   Thank you for allowing me to participate in patient's care.  If I can answer any additional questions, I would be pleased to do so.    Sincerely,   Marlowe Kays, PA-C

## 2023-04-11 NOTE — Telephone Encounter (Signed)
Copied from CRM [id number]. Topic: Clinical - Medication Refill >> Apr 11, 2023  3:21 PM Lorin Glass B wrote: Most Recent Primary Care Visit:  Provider: Lynnea Ferrier T  Department: [facility]  Visit Type: ACUTE  Date: 03/18/2023  Medication: Rimegepant Sulfate (NURTEC) 75 MG TBDP  Has the patient contacted their pharmacy?  (Agent: If no, request that the patient contact the pharmacy for the refill. If patient does not wish to contact the pharmacy document the reason why and proceed with request.) (Agent: If yes, when and what did the pharmacy advise?)  Is this the correct pharmacy for this prescription?  If no, delete pharmacy and type the correct one.  This is the patient's preferred pharmacy:  [id number] - [place], [place] - [address] AT [address] [place] [address] Phone: [phone] Fax: [phone]  [id number] - [place], [place] - [address] AT [facility] [address]  [place] [address] Phone: [phone] Fax: [phone]   Has the prescription been filled recently?   Is the patient out of the medication?   Has the patient been seen for an appointment in the last year OR does the patient have an upcoming appointment?   Can we respond through MyChart?   Agent: Please be advised that Rx refills may take up to 3 business days. We ask that you follow-up with your pharmacy.

## 2023-04-11 NOTE — Telephone Encounter (Signed)
Copied from CRM [id number]. Topic: Clinical - Medication Question >> Apr 11, 2023  3:23 PM Danika B wrote: Reason for CRM: Patient wants to know if she can pickup more samples of Rimegepant Sulfate (NURTEC) 75 MG TBDP until her rx is ready. Callback [phone]

## 2023-04-13 ENCOUNTER — Telehealth: Payer: Self-pay

## 2023-04-13 DIAGNOSIS — G43511 Persistent migraine aura without cerebral infarction, intractable, with status migrainosus: Secondary | ICD-10-CM | POA: Insufficient documentation

## 2023-04-13 NOTE — Telephone Encounter (Signed)
Refill not appropriate. Discontinued 02/16/23 due to change in therapy. Requested Prescriptions  Pending Prescriptions Disp Refills   amLODipine (NORVASC) 10 MG tablet [Pharmacy Med Name: AMLODIPINE BESYLATE 10MG  TABLETS] 90 tablet 0    Sig: TAKE 1 TABLET(10 MG) BY MOUTH AT BEDTIME     Cardiovascular: Calcium Channel Blockers 2 Failed - 04/10/2023  8:05 AM      Failed - Valid encounter within last 6 months    Recent Outpatient Visits           1 year ago Unilateral headache   [facility] [name], MD   1 year ago Lumbar radiculopathy, chronic   [facility] Pickard, Priscille Heidelberg, MD   1 year ago Viral upper respiratory tract infection   [facility] Tanya Nones, Priscille Heidelberg, MD   1 year ago Abnormal urine color   [facility] [name], MD   2 years ago Chest wall pain   [facility] Pickard, Priscille Heidelberg, MD              Passed - Last BP in normal range    BP Readings from Last 1 Encounters:  04/11/23 138/60         Passed - Last Heart Rate in normal range    Pulse Readings from Last 1 Encounters:  04/11/23 78

## 2023-04-13 NOTE — Telephone Encounter (Signed)
[name] (Key: [facility])  Your information has been sent to [facility].

## 2023-04-14 ENCOUNTER — Other Ambulatory Visit: Payer: Self-pay | Admitting: Family Medicine

## 2023-04-14 DIAGNOSIS — E785 Hyperlipidemia, unspecified: Secondary | ICD-10-CM

## 2023-04-15 NOTE — Telephone Encounter (Signed)
Requested Prescriptions  Pending Prescriptions Disp Refills   rosuvastatin (CRESTOR) 10 MG tablet [Pharmacy Med Name: ROSUVASTATIN 10MG  TABLETS] 90 tablet 0    Sig: TAKE 1 TABLET(10 MG) BY MOUTH DAILY     Cardiovascular:  Antilipid - Statins 2 Failed - 04/14/2023  2:43 PM      Failed - Valid encounter within last 12 months    Recent Outpatient Visits           1 year ago Unilateral headache   [facility] Tanya Nones, Priscille Heidelberg, MD   1 year ago Lumbar radiculopathy, chronic   [facility] Pickard, Priscille Heidelberg, MD   1 year ago Viral upper respiratory tract infection   [facility] Tanya Nones, Priscille Heidelberg, MD   1 year ago Abnormal urine color   [facility] Tanya Nones, Priscille Heidelberg, MD   2 years ago Chest wall pain   [facility] [name], MD              Failed - Lipid Panel in normal range within the last 12 months    Cholesterol  Date Value Ref Range Status  06/11/2022 175 <200 mg/dL Final   LDL Cholesterol (Calc)  Date Value Ref Range Status  06/11/2022 94 mg/dL (calc) Final    Comment:    Reference range: <100 . Desirable range <100 mg/dL for primary prevention;   <70 mg/dL for patients with CHD or diabetic patients  with > or = 2 CHD risk factors. Marland Kitchen LDL-C is now calculated using the Martin-Hopkins  calculation, which is a validated novel method providing  better accuracy than the Friedewald equation in the  estimation of LDL-C.  Horald Pollen et al. Lenox Ahr. 4098;119(14): 2061-2068  (http://education.[facility].com/faq/FAQ164)    HDL  Date Value Ref Range Status  06/11/2022 62 > OR = 50 mg/dL Final   Triglycerides  Date Value Ref Range Status  06/11/2022 99 <150 mg/dL Final         Passed - Cr in normal range and within 360 days    Creat  Date Value Ref Range Status  12/22/2022 1.09 (H) 0.50 - 1.05 mg/dL Final   Creatinine, Ser  Date Value Ref Range Status  03/16/2023 0.87 0.44 -  1.00 mg/dL Final   Creatinine, Urine  Date Value Ref Range Status  12/22/2022 114 20 - 275 mg/dL Final         Passed - Patient is not pregnant

## 2023-04-18 MED ORDER — NURTEC 75 MG PO TBDP: 1.0000 | ORAL_TABLET | Freq: Every day | ORAL | 3 refills | Status: DC | PRN

## 2023-04-19 ENCOUNTER — Other Ambulatory Visit: Payer: Self-pay | Admitting: Family Medicine

## 2023-04-19 DIAGNOSIS — E785 Hyperlipidemia, unspecified: Secondary | ICD-10-CM

## 2023-04-25 ENCOUNTER — Telehealth: Payer: Self-pay | Admitting: Physician Assistant

## 2023-04-25 NOTE — Telephone Encounter (Signed)
Called patient and she informed me that since Friday she has stopped taking the Nortriptyline and since then has been feeling better. Patient states that she has been on the Nortriptyline for about a week and noticed her pressure has been running high 163/80.She then stopped the Nortriptyline Friday and her BP has been back to her normal 117/65. She reports she is feeling better now.

## 2023-04-25 NOTE — Telephone Encounter (Signed)
Pt called in and left a message with the access nurse on 04/22/23. Caller was seen for migraine and prescribed medication. Caller states BP is around 163/80 in the morning after taking med. Pt has pulsating sensation in face and head.

## 2023-05-02 ENCOUNTER — Ambulatory Visit: Payer: Medicare Other | Admitting: Family Medicine

## 2023-05-02 ENCOUNTER — Emergency Department (HOSPITAL_COMMUNITY)
Admission: EM | Admit: 2023-05-02 | Discharge: 2023-05-02 | Disposition: A | Discharge status: Home / Self Care | Payer: Medicare Other | Attending: Emergency Medicine | Admitting: Emergency Medicine

## 2023-05-02 ENCOUNTER — Other Ambulatory Visit: Payer: Self-pay

## 2023-05-02 ENCOUNTER — Emergency Department (HOSPITAL_COMMUNITY): Payer: Medicare Other

## 2023-05-02 ENCOUNTER — Encounter: Payer: Self-pay | Admitting: Family Medicine

## 2023-05-02 ENCOUNTER — Ambulatory Visit: Payer: Self-pay | Admitting: Family Medicine

## 2023-05-02 VITALS — BP 160/92 | HR 95 | Temp 98.2°F | Ht 66.0 in | Wt 161.0 lb

## 2023-05-02 DIAGNOSIS — R110 Nausea: Secondary | ICD-10-CM | POA: Insufficient documentation

## 2023-05-02 DIAGNOSIS — R079 Chest pain, unspecified: Secondary | ICD-10-CM | POA: Diagnosis not present

## 2023-05-02 DIAGNOSIS — R0781 Pleurodynia: Secondary | ICD-10-CM | POA: Diagnosis not present

## 2023-05-02 DIAGNOSIS — Z7982 Long term (current) use of aspirin: Secondary | ICD-10-CM | POA: Insufficient documentation

## 2023-05-02 DIAGNOSIS — Z91040 Latex allergy status: Secondary | ICD-10-CM | POA: Diagnosis not present

## 2023-05-02 DIAGNOSIS — R0789 Other chest pain: Secondary | ICD-10-CM | POA: Diagnosis not present

## 2023-05-02 DIAGNOSIS — M549 Dorsalgia, unspecified: Secondary | ICD-10-CM | POA: Insufficient documentation

## 2023-05-02 DIAGNOSIS — R6889 Other general symptoms and signs: Secondary | ICD-10-CM | POA: Diagnosis not present

## 2023-05-02 DIAGNOSIS — R059 Cough, unspecified: Secondary | ICD-10-CM | POA: Diagnosis not present

## 2023-05-02 DIAGNOSIS — I700 Atherosclerosis of aorta: Secondary | ICD-10-CM | POA: Diagnosis not present

## 2023-05-02 DIAGNOSIS — Z743 Need for continuous supervision: Secondary | ICD-10-CM | POA: Diagnosis not present

## 2023-05-02 LAB — BASIC METABOLIC PANEL
Anion gap: 9 (ref 5–15)
BUN: 6 mg/dL — ABNORMAL LOW (ref 8–23)
CO2: 24 mmol/L (ref 22–32)
Calcium: 9.6 mg/dL (ref 8.9–10.3)
Chloride: 104 mmol/L (ref 98–111)
Creatinine, Ser: 0.85 mg/dL (ref 0.44–1.00)
GFR, Estimated: >60 mL/min (ref >60)
Glucose, Bld: 114 mg/dL — ABNORMAL HIGH (ref 70–99)
Potassium: 4 mmol/L (ref 3.5–5.1)
Sodium: 137 mmol/L (ref 135–145)

## 2023-05-02 LAB — CBC
HCT: 37.9 % (ref 36.0–46.0)
Hemoglobin: 12.4 g/dL (ref 12.0–15.0)
MCH: 30 pg (ref 26.0–34.0)
MCHC: 32.7 g/dL (ref 30.0–36.0)
MCV: 91.8 fL (ref 80.0–100.0)
Platelets: 269 10*3/uL (ref 150–400)
RBC: 4.13 MIL/uL (ref 3.87–5.11)
RDW: 12.3 % (ref 11.5–15.5)
WBC: 6.2 10*3/uL (ref 4.0–10.5)
nRBC: 0 % (ref 0.0–0.2)

## 2023-05-02 LAB — HEPATIC FUNCTION PANEL
ALT: 13 U/L (ref 0–44)
AST: 17 U/L (ref 15–41)
Albumin: 3.9 g/dL (ref 3.5–5.0)
Alkaline Phosphatase: 47 U/L (ref 38–126)
Bilirubin, Direct: <0.1 mg/dL (ref 0.0–0.2)
Total Bilirubin: 1 mg/dL (ref <1.2)
Total Protein: 6.9 g/dL (ref 6.5–8.1)

## 2023-05-02 LAB — LIPASE, BLOOD: Lipase: 23 U/L (ref 11–51)

## 2023-05-02 LAB — MAGNESIUM: Magnesium: 2 mg/dL (ref 1.7–2.4)

## 2023-05-02 LAB — TROPONIN I (HIGH SENSITIVITY)
Troponin I (High Sensitivity): 6 ng/L (ref <18)
Troponin I (High Sensitivity): 7 ng/L (ref <18)

## 2023-05-02 MED ORDER — ASPIRIN 81 MG PO CHEW: 324.0000 mg | CHEWABLE_TABLET | Freq: Once | ORAL | Status: DC

## 2023-05-02 MED ORDER — IOHEXOL 350 MG/ML SOLN
75.0000 mL | Freq: Once | INTRAVENOUS | Status: AC | PRN
  Administered 2023-05-02: 75 mL via INTRAVENOUS

## 2023-05-02 MED ORDER — NITROGLYCERIN 0.4 MG SL SUBL: 0.4000 mg | SUBLINGUAL_TABLET | Freq: Once | SUBLINGUAL | Status: AC

## 2023-05-02 NOTE — ED Provider Notes (Signed)
[place] EMERGENCY DEPARTMENT AT [facility] Provider Note   CSN: [id number] Arrival date & time: 05/02/23  1047     History  Chief Complaint  Patient presents with   Chest Pain    [name] is a 67 y.o. female.   Chest Pain Associated symptoms: back pain and nausea   Patient presents for left-sided chest pain since yesterday.  Chest pain location is described as under left breast and radiating to left thoracic back.  She was seen in PCPs office prior to arrival.  She was given ASA and NTG.  Pain resolved with NTG.  She is currently asymptomatic.  She has some transient nausea this morning.  She denies any other recent symptoms.     Home Medications Prior to Admission medications   Medication Sig Start Date End Date Taking? Authorizing Provider  acetaminophen (TYLENOL) 500 MG tablet Take 500 mg by mouth every 6 (six) hours as needed for moderate pain or headache.    [provider]  alendronate (FOSAMAX) 70 MG tablet Take 1 tablet (70 mg total) by mouth every 7 (seven) days. Take with a full glass of water on an empty stomach. 06/11/22   [name], MD  ALPRAZolam Prudy Feeler) 0.5 MG tablet TAKE 1 TABLET(0.5 MG) BY MOUTH TWICE DAILY AS NEEDED FOR ANXIETY 12/17/22   [name], MD  amLODipine (NORVASC) 10 MG tablet Take 10 mg by mouth daily.    [provider]  aspirin EC 81 MG tablet Take 81 mg by mouth at bedtime.     [provider]  Calcium 500 MG tablet Take 500 mg by mouth in the morning and at bedtime.    [provider]  carvedilol (COREG) 3.125 MG tablet TAKE 1 TABLET(3.125 MG) BY MOUTH TWICE DAILY 05/14/22   Gaston Islam., NP  cholecalciferol (VITAMIN D3) 25 MCG (1000 UT) tablet Take 2,000 Units by mouth daily.    [provider]  Coenzyme Q10 (COQ10) 100 MG CAPS Take 100 mg by mouth daily.    [provider]  CRANBERRY PO Take 1 each by mouth 2 (two) times a week. Chewables    [provider]  Cyanocobalamin (B-12) 2500 MCG TABS Take 2,500 mcg by mouth daily.    [provider]  dexlansoprazole (DEXILANT) 60 MG capsule Take 1 capsule (60 mg total) by mouth daily. 05/18/22   [name], [name] Friends, MD  doxazosin (CARDURA) 4 MG tablet TAKE 1 TABLET(4 MG) BY MOUTH DAILY 12/17/22   [name], MD  EPINEPHrine 0.3 mg/0.3 mL IJ SOAJ injection Inject 0.3 mg into the muscle as needed for anaphylaxis. 03/12/22   [name], MD  fluticasone (FLONASE) 50 MCG/ACT nasal spray Place 2 sprays into both nostrils daily. 01/23/23   [provider]  gabapentin (NEURONTIN) 300 MG capsule Take 1 capsule (300 mg total) by mouth at bedtime. Patient not taking: Reported on 05/02/2023 01/04/23   [name], MD  hydroquinone 4 % cream Apply 1 application topically 2 (two) times daily as needed (dark spots). 01/05/21   [provider]  hydrOXYzine (VISTARIL) 25 MG capsule TAKE 1 CAPSULE(25 MG) BY MOUTH EVERY 8 HOURS AS NEEDED 09/21/22   [name], MD  ipratropium (ATROVENT) 0.03 % nasal spray INSTILL 2 SPRAYS IN EACH NOSTRIL EVERY 12 HOURS 04/17/21   [name], MD  loratadine (CLARITIN) 10 MG tablet Take 10 mg by mouth daily as needed for allergies.  [provider]  MAGNESIUM PO Take 330 mg by mouth daily.    [provider]  nortriptyline (PAMELOR) 10 MG capsule Take 1 capsule (10 mg total) by mouth at bedtime. 04/11/23   Marcos Eke, PA-C  Omega-3 Fatty Acids (FISH OIL) 1000 MG CAPS Take 1,000 mg by mouth 3 (three) times a week.    [provider]  ondansetron (ZOFRAN) 4 MG tablet TAKE 1 TABLET(4 MG) BY MOUTH EVERY 8 HOURS AS NEEDED FOR NAUSEA OR VOMITING 03/06/21   [name], PA-C  Plecanatide (TRULANCE) 3 MG TABS Take 3 mg by mouth daily. 05/18/22   [name], [name] Friends, MD  promethazine (PHENERGAN) 25 MG tablet Take 1 tablet (25 mg total) by mouth every 8 (eight) hours as needed for nausea or vomiting. Patient not  taking: Reported on 05/02/2023 03/18/23   [name], MD  Rimegepant Sulfate (NURTEC) 75 MG TBDP Take 1 tablet (75 mg total) by mouth daily as needed. Patient not taking: Reported on 05/02/2023 04/18/23   [name], MD  rosuvastatin (CRESTOR) 10 MG tablet TAKE 1 TABLET(10 MG) BY MOUTH DAILY 04/15/23   [name], MD  sodium chloride (OCEAN) 0.65 % SOLN nasal spray Place 1 spray into both nostrils as needed for congestion.    [provider]  spironolactone (ALDACTONE) 25 MG tablet TAKE 1 TABLET(25 MG) BY MOUTH DAILY 02/24/23   [name], MD  triamcinolone cream (KENALOG) 0.1 % Apply 1 Application topically 2 (two) times daily. 10/29/21   [name], MD  valsartan (DIOVAN) 320 MG tablet TAKE 1 TABLET BY MOUTH EVERY DAY 02/17/23   [name], MD  vitamin E 400 UNIT capsule Take 400 Units by mouth 3 (three) times a week.    [provider]  zinc gluconate 50 MG tablet Take 50 mg by mouth daily.    [provider]      Allergies    Ciprofloxacin, Latex, and Vitamin c [bioflavonoid products]    Review of Systems   Review of Systems  Cardiovascular:  Positive for chest pain.  Gastrointestinal:  Positive for nausea.  Musculoskeletal:  Positive for back pain.  All other systems reviewed and are negative.   Physical Exam Updated Vital Signs BP (!) 143/67   Pulse 71   Temp 98.2 F (36.8 C) (Oral)   Resp 20   Ht 5\' 6"  (1.676 m)   Wt 73 kg   SpO2 100%   BMI 25.98 kg/m  Physical Exam Vitals and nursing note reviewed.  Constitutional:      General: She is not in acute distress.    Appearance: She is well-developed. She is not ill-appearing, toxic-appearing or diaphoretic.  HENT:     Head: Normocephalic and atraumatic.  Eyes:     Conjunctiva/sclera: Conjunctivae normal.  Cardiovascular:     Rate and Rhythm: Normal rate and regular rhythm.     Heart sounds: No murmur heard. Pulmonary:     Effort: Pulmonary effort is  normal. No respiratory distress.     Breath sounds: Normal breath sounds. No decreased breath sounds, wheezing, rhonchi or rales.  Chest:     Chest wall: No tenderness.  Abdominal:     Palpations: Abdomen is soft.     Tenderness: There is no abdominal tenderness.  Musculoskeletal:        General: No swelling.     Cervical back: Normal range of motion and neck supple.  Skin:    General:  Skin is warm and dry.  Neurological:     General: No focal deficit present.     Mental Status: She is alert and oriented to person, place, and time.  Psychiatric:        Mood and Affect: Mood normal.        Behavior: Behavior normal.     ED Results / Procedures / Treatments   Labs (all labs ordered are listed, but only abnormal results are displayed) Labs Reviewed  BASIC METABOLIC PANEL - Abnormal; Notable for the following components:      Result Value   Glucose, Bld 114 (*)    BUN 6 (*)    All other components within normal limits  CBC  MAGNESIUM  LIPASE, BLOOD  HEPATIC FUNCTION PANEL  TROPONIN I (HIGH SENSITIVITY)  TROPONIN I (HIGH SENSITIVITY)    EKG EKG Interpretation Date/Time:  Monday May 02 2023 10:52:13 EST Ventricular Rate:  74 PR Interval:  166 QRS Duration:  90 QT Interval:  398 QTC Calculation: 441 R Axis:   53  Text Interpretation: Normal sinus rhythm Nonspecific T wave abnormality Confirmed by Gloris Manchester [phone]) on 05/02/2023 11:33:31 AM  Radiology DG Chest 2 View Result Date: 05/02/2023 CLINICAL DATA:  Chest pain EXAM: CHEST - 2 VIEW COMPARISON:  Chest x-ray March 26, 2022 FINDINGS: The cardiomediastinal silhouette is unchanged in contour. No focal pulmonary opacity. No pleural effusion or pneumothorax. Surgical clips in the right upper abdomen. No acute osseous abnormality. IMPRESSION: No active cardiopulmonary disease. Electronically Signed   By: Jacob Moores M.D.   On: 05/02/2023 11:22    Procedures Procedures    Medications Ordered in  ED Medications - No data to display  ED Course/ Medical Decision Making/ A&P                                 Medical Decision Making Amount and/or Complexity of Data Reviewed Labs: ordered. Radiology: ordered.   This patient presents to the ED for concern of chest pain, this involves an extensive number of treatment options, and is a complaint that carries with it a high risk of complications and morbidity.  The differential diagnosis includes ACS, PE, musculoskeletal pain, GERD   Co morbidities that complicate the patient evaluation  GERD, HTN, HLD, AAA, DM, migraines, Schatzki's ring, hiatal hernia   Additional history obtained:  Additional history obtained from N/A External records from outside source obtained and reviewed including EMR   Lab Tests:  I Ordered, and personally interpreted labs.  The pertinent results include: Normal creatinine, normal electrolytes, hepatobiliary enzymes, normal troponin, normal lipase   Imaging Studies ordered:  I ordered imaging studies including chest x-ray, CTA chest I independently visualized and interpreted imaging which showed no acute findings on x-ray, CTA pending at time of signout. I agree with the radiologist interpretation   Cardiac Monitoring: / EKG:  The patient was maintained on a cardiac monitor.  I personally viewed and interpreted the cardiac monitored which showed an underlying rhythm of: Sinus rhythm  Problem List / ED Course / Critical interventions / Medication management  Patient presenting for left-sided chest pain.  Symptoms resolved after NTG, which she received at her PCPs office prior to arrival.  On arrival, patient is well-appearing.  Vital signs are normal.  EKG does not show evidence of STEMI.  She received aspirin at her doctor's office.  Workup in the ED was initiated.  Patient's initial lab  work is reassuring.  She remained asymptomatic while in the ED.  Repeat troponin and CTA were pending at time of  signout.  Care of patient was signed out to oncoming ED provider.   Social Determinants of Health:  Has access to outpatient care        Final Clinical Impression(s) / ED Diagnoses Final diagnoses:  Chest pain, unspecified type    Rx / DC Orders ED Discharge Orders          Ordered    Ambulatory referral to Cardiology       Comments: If you have not heard from the Cardiology office within the next 72 hours please call [phone].   05/02/23 1702              Gloris Manchester, MD 05/02/23 [phone]

## 2023-05-02 NOTE — ED Notes (Signed)
Patient verbalizes understanding of discharge instructions. Opportunity for questioning and answers were provided. Armband removed by staff, pt discharged from ED. Pt ambulatory to ED waiting room with steady gait.  

## 2023-05-02 NOTE — ED Provider Triage Note (Signed)
Emergency Medicine Provider Triage Evaluation Note  [name] , a 67 y.o. female  was evaluated in triage.  Pt complains of chest pain.  Review of Systems  Positive: Chest pain, radiating to back, transient nausea Negative: Shortness of breath, dizzy, headache  Physical Exam  BP (!) 152/57 (BP Location: Right Arm)   Pulse 74   Temp 98.9 F (37.2 C) (Oral)   Resp 18   Ht 5\' 6"  (1.676 m)   Wt 73 kg   SpO2 99%   BMI 25.98 kg/m  Gen:   Awake, no distress   Resp:  Normal effort  MSK:   Moves extremities without difficulty   Medical Decision Making  Medically screening exam initiated at 11:04 AM.  Appropriate orders placed.  ALAYCIA KOKER was informed that the remainder of the evaluation will be completed by another provider, this initial triage assessment does not replace that evaluation, and the importance of remaining in the ED until their evaluation is complete.  Patient presenting from PCPs office for left-sided chest pain.  Onset of chest pain was yesterday.  It was pretty throughout the night.  She was given ASA and NTG at her doctor's office.  Pain resolved after NTG.   Gloris Manchester, MD 05/02/23 1106

## 2023-05-02 NOTE — Assessment & Plan Note (Signed)
Symptoms concerning for MI including left sided chest pain, numbness and tingling in her LUE, nausea, and SOB described as "heavy breathing". Unable to obtain EKG in office. Administered 325mg  ASA and 1 nitroglycerin, pain improved after administration. Placed on 3L nasal cannula and EMS called for transport to ED. Upon EMS arrival EKG was performed and showed NSR. Pt transported to [facility] for further evaluation.

## 2023-05-02 NOTE — ED Provider Notes (Signed)
  Physical Exam  BP (!) 148/61   Pulse 69   Temp 98.2 F (36.8 C) (Oral)   Resp 16   Ht 5\' 6"  (1.676 m)   Wt 73 kg   SpO2 100%   BMI 25.98 kg/m   Physical Exam  Procedures  Procedures  ED Course / MDM    Medical Decision Making Amount and/or Complexity of Data Reviewed Labs: ordered. Radiology: ordered.  Risk Prescription drug management.   Received in signout.  Chest pain from front to back.  Troponin negative.  EKG reassuring.  Has negative troponins and negative CTA.  Appears stable for discharge home       Benjiman Core, MD 05/02/23 1946

## 2023-05-02 NOTE — Progress Notes (Signed)
Subjective:  HPI: [name] is a 67 y.o. female presenting on 05/02/2023 for Acute Visit (left rib pain; [employer]; e2c2rn per pt pain started last night)   HPI Patient is in today for left sided chest pain since last night. No trauma, fall, injury, or heavy lifting. She describes this pain as dull and started around 10pm prior to her going to sleep, she does feel like she's "breathing hard" and is also hoarse. The pain does not radiate but she also endorses left finger numbness and tingling. She did have some nausea this AM as well.. Denies SOB or dyspnea with exertion, cough, fever, chills, body aches. She cannot identify any triggers, it is not worse with activity and not relieved with rest. She has taken nothing to help the pain.   Review of Systems  All other systems reviewed and are negative.   Relevant past medical history reviewed and updated as indicated.   Past Medical History:  Diagnosis Date   Anxiety    Carotid stenosis, asymptomatic, bilateral    Cataract    Chronic left shoulder pain    Headache    Hiatal hernia    small   Hyperlipidemia    Hypertension    Osteoporosis    Pre-diabetes    Schatzki's ring    Seasonal allergies    Tremor      Past Surgical History:  Procedure Laterality Date   CARDIAC CATHETERIZATION  2005   "Ms. Huckins has essentially normal coronary arteries and normal left ventricular function.  She will be treated empirically with anti-reflux measures"   CATARACT EXTRACTION, BILATERAL     CHOLECYSTECTOMY     COLONOSCOPY    06/16/2004   BHA:LPFXTK rectum/Diminutive polyps of the splenic flexure and the cecum/The remainder of the colonic mucosa appeared normal pathology (hyperplastic polyps).   COLONOSCOPY N/A 08/07/2015   Dr. Jena Gauss: 6 mm descending colon tubular adenoma, multiple medium-mouthed diverticula in sigmoid colon. surveillance 2022   COLONOSCOPY N/A 04/15/2021   Procedure: COLONOSCOPY;  Surgeon: Corbin Ade, MD;   Location: [facility];  Service: Endoscopy;  Laterality: N/A;  9:30am   ESOPHAGOGASTRODUODENOSCOPY  09/11/2001   WIO:[id number] ring otherwise normal esophagus/Normal stomach/Normal D1 and D2 status post passage of a 56 French Maloney dilator   ESOPHAGOGASTRODUODENOSCOPY N/A 11/11/2017   Procedure: ESOPHAGOGASTRODUODENOSCOPY (EGD);  Surgeon: Corbin Ade, MD; mild Schatzki ring s/p dilation and mild erosive gastropathy.   ESOPHAGOGASTRODUODENOSCOPY (EGD) WITH ESOPHAGEAL DILATION N/A 01/25/2013   Dr. Jena Gauss- schatzki's ring- 54 F dilation. small hiatal hernia   HYSTEROSCOPY WITH D & C N/A 03/06/2019   Procedure: DILATATION AND CURETTAGE /HYSTEROSCOPY;  Surgeon: Tilda Burrow, MD;  Location: [facility];  Service: Gynecology;  Laterality: N/A;   MALONEY DILATION N/A 11/11/2017   Procedure: Elease Hashimoto DILATION;  Surgeon: Corbin Ade, MD;  Location: [facility];  Service: Endoscopy;  Laterality: N/A;   POLYPECTOMY N/A 03/06/2019   Procedure: POLYPECTOMY ( REMOVAL OF ENDOMETRIAL POLYP);  Surgeon: Tilda Burrow, MD;  Location: [facility];  Service: Gynecology;  Laterality: N/A;   POLYPECTOMY  04/15/2021   Procedure: POLYPECTOMY;  Surgeon: Corbin Ade, MD;  Location: [facility];  Service: Endoscopy;;   TUBAL LIGATION      Allergies and medications reviewed and updated.   Current Outpatient Medications:    acetaminophen (TYLENOL) 500 MG tablet, Take 500 mg by mouth every 6 (six) hours as needed for moderate pain or headache., Disp: , Rfl:    alendronate (  FOSAMAX) 70 MG tablet, Take 1 tablet (70 mg total) by mouth every 7 (seven) days. Take with a full glass of water on an empty stomach., Disp: 4 tablet, Rfl: 11   ALPRAZolam (XANAX) 0.5 MG tablet, TAKE 1 TABLET(0.5 MG) BY MOUTH TWICE DAILY AS NEEDED FOR ANXIETY, Disp: 180 tablet, Rfl: 0   amLODipine (NORVASC) 10 MG tablet, Take 10 mg by mouth daily., Disp: , Rfl:    aspirin EC 81 MG tablet, Take 81 mg by mouth at bedtime. , Disp: , Rfl:     Calcium 500 MG tablet, Take 500 mg by mouth in the morning and at bedtime., Disp: , Rfl:    carvedilol (COREG) 3.125 MG tablet, TAKE 1 TABLET(3.125 MG) BY MOUTH TWICE DAILY, Disp: 180 tablet, Rfl: 3   cholecalciferol (VITAMIN D3) 25 MCG (1000 UT) tablet, Take 2,000 Units by mouth daily., Disp: , Rfl:    Coenzyme Q10 (COQ10) 100 MG CAPS, Take 100 mg by mouth daily., Disp: , Rfl:    CRANBERRY PO, Take 1 each by mouth 2 (two) times a week. Chewables, Disp: , Rfl:    Cyanocobalamin (B-12) 2500 MCG TABS, Take 2,500 mcg by mouth daily., Disp: , Rfl:    dexlansoprazole (DEXILANT) 60 MG capsule, Take 1 capsule (60 mg total) by mouth daily., Disp: 30 capsule, Rfl: 11   doxazosin (CARDURA) 4 MG tablet, TAKE 1 TABLET(4 MG) BY MOUTH DAILY, Disp: 30 tablet, Rfl: 1   EPINEPHrine 0.3 mg/0.3 mL IJ SOAJ injection, Inject 0.3 mg into the muscle as needed for anaphylaxis., Disp: 1 each, Rfl: 1   fluticasone (FLONASE) 50 MCG/ACT nasal spray, Place 2 sprays into both nostrils daily., Disp: , Rfl:    hydroquinone 4 % cream, Apply 1 application topically 2 (two) times daily as needed (dark spots)., Disp: , Rfl:    hydrOXYzine (VISTARIL) 25 MG capsule, TAKE 1 CAPSULE(25 MG) BY MOUTH EVERY 8 HOURS AS NEEDED, Disp: 90 capsule, Rfl: 2   ipratropium (ATROVENT) 0.03 % nasal spray, INSTILL 2 SPRAYS IN EACH NOSTRIL EVERY 12 HOURS, Disp: 30 mL, Rfl: 12   loratadine (CLARITIN) 10 MG tablet, Take 10 mg by mouth daily as needed for allergies., Disp: , Rfl:    MAGNESIUM PO, Take 330 mg by mouth daily., Disp: , Rfl:    nortriptyline (PAMELOR) 10 MG capsule, Take 1 capsule (10 mg total) by mouth at bedtime., Disp: 90 capsule, Rfl: 3   Omega-3 Fatty Acids (FISH OIL) 1000 MG CAPS, Take 1,000 mg by mouth 3 (three) times a week., Disp: , Rfl:    ondansetron (ZOFRAN) 4 MG tablet, TAKE 1 TABLET(4 MG) BY MOUTH EVERY 8 HOURS AS NEEDED FOR NAUSEA OR VOMITING, Disp: 30 tablet, Rfl: 0   Plecanatide (TRULANCE) 3 MG TABS, Take 3 mg by mouth daily.,  Disp: 30 tablet, Rfl: 11   rosuvastatin (CRESTOR) 10 MG tablet, TAKE 1 TABLET(10 MG) BY MOUTH DAILY, Disp: 90 tablet, Rfl: 0   sodium chloride (OCEAN) 0.65 % SOLN nasal spray, Place 1 spray into both nostrils as needed for congestion., Disp: , Rfl:    spironolactone (ALDACTONE) 25 MG tablet, TAKE 1 TABLET(25 MG) BY MOUTH DAILY, Disp: 90 tablet, Rfl: 0   triamcinolone cream (KENALOG) 0.1 %, Apply 1 Application topically 2 (two) times daily., Disp: 30 g, Rfl: 0   valsartan (DIOVAN) 320 MG tablet, TAKE 1 TABLET BY MOUTH EVERY DAY, Disp: 90 tablet, Rfl: 1   vitamin E 400 UNIT capsule, Take 400 Units by mouth 3 (three)  times a week., Disp: , Rfl:    zinc gluconate 50 MG tablet, Take 50 mg by mouth daily., Disp: , Rfl:    gabapentin (NEURONTIN) 300 MG capsule, Take 1 capsule (300 mg total) by mouth at bedtime. (Patient not taking: Reported on 05/02/2023), Disp: 90 capsule, Rfl: 3   promethazine (PHENERGAN) 25 MG tablet, Take 1 tablet (25 mg total) by mouth every 8 (eight) hours as needed for nausea or vomiting. (Patient not taking: Reported on 05/02/2023), Disp: 20 tablet, Rfl: 0   Rimegepant Sulfate (NURTEC) 75 MG TBDP, Take 1 tablet (75 mg total) by mouth daily as needed. (Patient not taking: Reported on 05/02/2023), Disp: 30 tablet, Rfl: 3  Allergies  Allergen Reactions   Ciprofloxacin Shortness Of Breath   Latex Other (See Comments)    Burns skin   Vitamin C [Bioflavonoid Products] Itching    Objective:   BP (!) 160/92   Pulse 95   Temp 98.2 F (36.8 C) (Oral)   Ht 5\' 6"  (1.676 m)   Wt 161 lb (73 kg)   SpO2 98% Comment: 3 Liters.  BMI 25.99 kg/m      05/02/2023   10:53 AM 05/02/2023   10:52 AM 05/02/2023    9:59 AM  Vitals with BMI  Height  5\' 6"    Weight  160 lbs 15 oz   BMI  25.99   Systolic 152  160  Diastolic 57  92  Pulse 74  95     Physical Exam Vitals and nursing note reviewed.  Constitutional:      Appearance: Normal appearance. She is normal weight.  HENT:      Head: Normocephalic and atraumatic.  Cardiovascular:     Rate and Rhythm: Normal rate and regular rhythm.     Pulses: Normal pulses.     Heart sounds: Murmur heard.  Pulmonary:     Effort: Pulmonary effort is normal.     Breath sounds: Normal breath sounds.  Skin:    General: Skin is warm and dry.  Neurological:     General: No focal deficit present.     Mental Status: She is alert and oriented to person, place, and time. Mental status is at baseline.  Psychiatric:        Mood and Affect: Mood normal.        Behavior: Behavior normal.        Thought Content: Thought content normal.        Judgment: Judgment normal.     Assessment & Plan:  Left-sided chest wall pain Assessment & Plan: Symptoms concerning for MI including left sided chest pain, numbness and tingling in her LUE, nausea, and SOB described as "heavy breathing". Unable to obtain EKG in office. Administered 325mg  ASA and 1 nitroglycerin, pain improved after administration. Placed on 3L nasal cannula and EMS called for transport to ED. Upon EMS arrival EKG was performed and showed NSR. Pt transported to [facility] for further evaluation.  Orders: -     EKG 12-Lead     Follow up plan: Return transfer to ED.  Park Meo, FNP

## 2023-05-02 NOTE — ED Triage Notes (Signed)
Pt BIB EMS send from PCP for left sided chest pain under breast. Pt was given nitroglycerin by EMS and pain went from 9/10 to 0/10. Pt denies vomiting but did have some nausea.

## 2023-05-02 NOTE — Telephone Encounter (Signed)
Copied from CRM [id number]. Topic: Clinical - Red Word Triage >> May 02, 2023  8:05 AM [name] wrote: Patient/patient representative is calling to schedule an appointment. Refer to attachments for appointment information.   Chief Complaint: left rib pain Symptoms: started this yesterday with left rib pain Frequency: started yesterday Pertinent Negatives: Patient denies fever, sob,difficulty walking Disposition: [] ED /[] Urgent Care (no appt availability in office) / [x] Appointment(In office/virtual)/ []  [facility]/ [] Home Care/ [] Refused Recommended Disposition /[] [facility]/ []  Follow-up with PCP Additional Notes: called in with c/o left rib pain that started yesterday.  Denies sob, fever, difficulty walking.  Does c/o stuffy nose.  Apt. Made for this am.  Instructed to go to ER if becomes worse.   Reason for Disposition  [1] SEVERE pain (e.g., excruciating, unable to do any normal activities) AND [2] not improved 2 hours after pain medicine  Answer Assessment - Initial Assessment Questions 1. ONSET: "When did the muscle aches or body pains start?"      yesterday 2. LOCATION: "What part of your body is hurting?" (e.g., entire body, arms, legs)      Upper ribs on left side 3. SEVERITY: "How bad is the pain?" (Scale 1-10; or mild, moderate, severe)   - MILD (1-3): doesn't interfere with normal activities    - MODERATE (4-7): interferes with normal activities or awakens from sleep    - SEVERE (8-10):  excruciating pain, unable to do any normal activities      8/10 4. CAUSE: "What do you think is causing the pains?"     "I don't know" 5. FEVER: "Have you been having fever?"     no 6. OTHER SYMPTOMS: "Do you have any other symptoms?" (e.g., chest pain, weakness, rash, cold or flu symptoms, weight loss)     Nose is stuffy  Protocols used: Muscle Aches and Body Pain-A-AH

## 2023-05-02 NOTE — Discharge Instructions (Signed)
Test results today are reassuring.  You should follow-up with the cardiology office.  A referral has been sent.  If you do not hear from them by the end of the week, call the telephone number below to set up a follow-up appointment.  Return to the emergency department for any new or worsening symptoms of concern.

## 2023-05-07 ENCOUNTER — Other Ambulatory Visit: Payer: Self-pay | Admitting: Family Medicine

## 2023-05-09 ENCOUNTER — Encounter: Payer: Self-pay | Admitting: Physician Assistant

## 2023-05-12 ENCOUNTER — Other Ambulatory Visit: Payer: Self-pay | Admitting: Nurse Practitioner

## 2023-05-21 ENCOUNTER — Other Ambulatory Visit: Payer: Medicare Other

## 2023-05-24 ENCOUNTER — Ambulatory Visit: Payer: Medicare Other | Attending: Cardiovascular Disease | Admitting: Cardiovascular Disease

## 2023-05-24 ENCOUNTER — Encounter: Payer: Self-pay | Admitting: Cardiovascular Disease

## 2023-05-24 VITALS — BP 138/70 | HR 78 | Ht 66.0 in | Wt 162.0 lb

## 2023-05-24 DIAGNOSIS — R002 Palpitations: Secondary | ICD-10-CM | POA: Diagnosis not present

## 2023-05-24 DIAGNOSIS — R0989 Other specified symptoms and signs involving the circulatory and respiratory systems: Secondary | ICD-10-CM

## 2023-05-24 DIAGNOSIS — E785 Hyperlipidemia, unspecified: Secondary | ICD-10-CM | POA: Diagnosis not present

## 2023-05-24 DIAGNOSIS — I739 Peripheral vascular disease, unspecified: Secondary | ICD-10-CM | POA: Diagnosis not present

## 2023-05-24 DIAGNOSIS — I6523 Occlusion and stenosis of bilateral carotid arteries: Secondary | ICD-10-CM | POA: Diagnosis not present

## 2023-05-24 MED ORDER — CILOSTAZOL 50 MG PO TABS: 50.0000 mg | ORAL_TABLET | Freq: Two times a day (BID) | ORAL | 11 refills | Status: DC

## 2023-05-24 NOTE — Progress Notes (Signed)
 CARDIOLOGY CONSULT NOTE       Patient ID: OKEMA ROLLINSON MRN: [id number] DOB/AGE: 1956-02-16 68 y.o.  Referring Physician: Duanne Primary Physician: Duanne Butler DASEN, MD Primary Cardiologist: Delford    HPI:  68 y.o. with labile HTN, HLD, history of esophageal web with dilatation and GERD Seen in [facility] ER October 2020  For hypertensive urgency BP 190 systolic. Dr Duanne had started her on clonidine  patch and adjusted her ARB Complained of fluttering in chest for 4 days Compliant with meds Labs ok except K 3.3 Troponin 18->19 no trend Not sure why checked as she had no chest pain. CXR NAD CT head No acute findings Recommended admission But left AMA ECG non acute with LAE non LVH no acute ST changes 02/02/19 Previous smoker quit in 2014  She has no documented history of CAD  Had normal cath 2014 with pain ascribed to GI cause  Non ischemic myovue November 2021  and 08/22/20 EF 62%  PET/CT 04/28/22 normal with stress EF 66% normal blood flow mild 3 vessel calcium  noted    Follows with Dr Sheree [facility] for her PVD Carotid: 03/09/23 right 60-79% left 40-59% ABI: right 0.62 and left 0.81   She has a long standing tremor which is stable with no signs Parkinson's   BP has been labile and elevated Current regimen includes  Norvasc  10 mg  Coreg  3.125 bid Clonidine  0.1 PRN Lasix  20 mg Valsartan  320 Aldactone  25 mg  06/21/22 complained to primary of palpitations Near daily lasting minutes More skips not sustained rapid beating Feels it in her throat and makes her feel queezy  ECG in office was NSR She now feels better and is on coreg  already Monitor 08/03/22 PVCls no significant arrhythmias  Some stress at home Has a brother and two grand kids living with her Right leg gets a little weak with Walking Pain seems worse and does get some rest pain at night Discussed starting pletal  and seeing Dr Sheree sooner for ABI may need angiogram. Left knee more swollen and needs f/u with orhto post arthroscopic  surgery last year   Seen in ED 12/23 with back/chest pain r/o  CTA negative PE  CTA for facial pain 05/16/22 with 70% right carotid bifurcation      ROS All other systems reviewed and negative except as noted above  Past Medical History:  Diagnosis Date   Anxiety    Carotid stenosis, asymptomatic, bilateral    Cataract    Chronic left shoulder pain    Headache    Hiatal hernia    small   Hyperlipidemia    Hypertension    Osteoporosis    Pre-diabetes    Schatzki's ring    Seasonal allergies    Tremor     Family History  Problem Relation Age of Onset   Diabetes Other    Diabetes Mother    Heart disease Mother    Heart disease Father    Diabetes Sister    Diabetes Brother    Hypertension Brother    Diabetes Daughter    Hypertension Maternal Grandmother    Stroke Maternal Grandfather    Early death Paternal Grandfather    Colon cancer Neg Hx    Liver disease Neg Hx     Social History   Socioeconomic History   Marital status: Single    Spouse name: Not on file   Number of children: 3   Years of education: Not on file   Highest education level: 10th  grade  Occupational History   Occupation: Public Affairs Consultant: [employer]  Tobacco Use   Smoking status: Former    Current packs/day: 0.00    Average packs/day: 0.3 packs/day for 35.0 years (8.8 ttl pk-yrs)    Types: Cigarettes    Start date: 12/31/1977    Quit date: 12/31/2012    Years since quitting: 10.4   Smokeless tobacco: Never  Vaping Use   Vaping status: Never Used  Substance and Sexual Activity   Alcohol use: Not Currently   Drug use: No   Sexual activity: Not Currently    Partners: Male    Birth control/protection: Post-menopausal, Surgical    Comment: tubal  Other Topics Concern   Not on file  Social History Narrative   Eats all food groups.    Wears seatbelt.    Has 3 children.   Prior smoker.   Attends church.    Used to work in designer, fashion/clothing.    Drives.   Enjoys going out with  family.    Right handed   One story home   Social Drivers of Health   Financial Resource Strain: Low Risk  (05/02/2023)   Overall Financial Resource Strain (CARDIA)    Difficulty of Paying Living Expenses: Not very hard  Food Insecurity: No Food Insecurity (05/02/2023)   Hunger Vital Sign    Worried About Running Out of Food in the Last Year: Never true    Ran Out of Food in the Last Year: Never true  Transportation Needs: No Transportation Needs (05/02/2023)   PRAPARE - Administrator, Civil Service (Medical): No    Lack of Transportation (Non-Medical): No  Physical Activity: Sufficiently Active (05/02/2023)   Exercise Vital Sign    Days of Exercise per Week: 2 days    Minutes of Exercise per Session: 90 min  Recent Concern: Physical Activity - Insufficiently Active (03/18/2023)   Exercise Vital Sign    Days of Exercise per Week: 3 days    Minutes of Exercise per Session: 30 min  Stress: Stress Concern Present (05/02/2023)   [employer] of Occupational Health - Occupational Stress Questionnaire    Feeling of Stress : To some extent  Social Connections: Moderately Integrated (05/02/2023)   Social Connection and Isolation Panel [NHANES]    Frequency of Communication with Friends and Family: More than three times a week    Frequency of Social Gatherings with Friends and Family: More than three times a week    Attends Religious Services: More than 4 times per year    Active Member of [employer] or Organizations: Yes    Attends Banker Meetings: 1 to 4 times per year    Marital Status: Divorced  Intimate Partner Violence: Not At Risk (06/01/2022)   Humiliation, Afraid, Rape, and Kick questionnaire    Fear of Current or Ex-Partner: No    Emotionally Abused: No    Physically Abused: No    Sexually Abused: No    Past Surgical History:  Procedure Laterality Date   CARDIAC CATHETERIZATION  2005   Ms. Railey has essentially normal coronary arteries and  normal left ventricular function.  She will be treated empirically with anti-reflux measures   CATARACT EXTRACTION, BILATERAL     CHOLECYSTECTOMY     COLONOSCOPY    06/16/2004   MFM:Wnmfjo rectum/Diminutive polyps of the splenic flexure and the cecum/The remainder of the colonic mucosa appeared normal pathology (hyperplastic polyps).   COLONOSCOPY N/A 08/07/2015   Dr.  [name]: 6 mm descending colon tubular adenoma, multiple medium-mouthed diverticula in sigmoid colon. surveillance 2022   COLONOSCOPY N/A 04/15/2021   Procedure: COLONOSCOPY;  Surgeon: Shaaron Lamar HERO, MD;  Location: [facility];  Service: Endoscopy;  Laterality: N/A;  9:30am   ESOPHAGOGASTRODUODENOSCOPY  09/11/2001   MFM:Dryjusxpd ring otherwise normal esophagus/Normal stomach/Normal D1 and D2 status post passage of a 56 French Maloney dilator   ESOPHAGOGASTRODUODENOSCOPY N/A 11/11/2017   Procedure: ESOPHAGOGASTRODUODENOSCOPY (EGD);  Surgeon: Shaaron Lamar HERO, MD; mild Schatzki ring s/p dilation and mild erosive gastropathy.   ESOPHAGOGASTRODUODENOSCOPY (EGD) WITH ESOPHAGEAL DILATION N/A 01/25/2013   Dr. Shaaron- schatzki's ring- 54 F dilation. small hiatal hernia   HYSTEROSCOPY WITH D & C N/A 03/06/2019   Procedure: DILATATION AND CURETTAGE /HYSTEROSCOPY;  Surgeon: Edsel Norleen GAILS, MD;  Location: [facility];  Service: Gynecology;  Laterality: N/A;   MALONEY DILATION N/A 11/11/2017   Procedure: AGAPITO DILATION;  Surgeon: Shaaron Lamar HERO, MD;  Location: [facility];  Service: Endoscopy;  Laterality: N/A;   POLYPECTOMY N/A 03/06/2019   Procedure: POLYPECTOMY ( REMOVAL OF ENDOMETRIAL POLYP);  Surgeon: Edsel Norleen GAILS, MD;  Location: [facility];  Service: Gynecology;  Laterality: N/A;   POLYPECTOMY  04/15/2021   Procedure: POLYPECTOMY;  Surgeon: Shaaron Lamar HERO, MD;  Location: [facility];  Service: Endoscopy;;   TUBAL LIGATION          Physical Exam: Blood pressure 138/70, pulse 78, height 5' 6 (1.676 m), weight 162 lb (73.5 kg), SpO2  99%.    Affect appropriate Healthy:  appears stated age HEENT: normal Neck supple with no adenopathy JVP left carotid bruit Lungs clear with no wheezing and good diaphragmatic motion Heart:  S1/S2 short 2/6 SEM murmur, no rub, gallop or click PMI normal Abdomen: benighn, BS positve, no tenderness, no AAA no bruit.  No HSM or HJR Decreased pedal pulses on right Varicosities in legs  Neuro non-focal UE tremors  Skin warm and dry Post arthroscopy left knee  Decreased pedal pulses    Labs:   Lab Results  Component Value Date   WBC 6.2 05/02/2023   HGB 12.4 05/02/2023   HCT 37.9 05/02/2023   MCV 91.8 05/02/2023   PLT 269 05/02/2023    No results for input(s): NA, K, CL, CO2, BUN, CREATININE, CALCIUM , PROT, BILITOT, ALKPHOS, ALT, AST, GLUCOSE in the last 168 hours.  Invalid input(s): LABALBU  Lab Results  Component Value Date   CKTOTAL 78 06/10/2020   TROPONINI <0.03 12/28/2017    Lab Results  Component Value Date   CHOL 175 06/11/2022   CHOL 163 01/20/2021   CHOL 157 06/27/2018   Lab Results  Component Value Date   HDL 62 06/11/2022   HDL 60 01/20/2021   HDL 66 06/27/2018   Lab Results  Component Value Date   LDLCALC 94 06/11/2022   LDLCALC 89 01/20/2021   LDLCALC 77 06/27/2018   Lab Results  Component Value Date   TRIG 99 06/11/2022   TRIG 62 01/20/2021   TRIG 49 06/27/2018   Lab Results  Component Value Date   CHOLHDL 2.8 06/11/2022   CHOLHDL 2.7 01/20/2021   CHOLHDL 2.4 06/27/2018   No results found for: LDLDIRECT    Radiology: CT Angio Chest PE W and/or Wo Contrast Result Date: 05/02/2023 CLINICAL DATA:  Left-sided chest pain. Concern for pulmonary embolism. EXAM: CT ANGIOGRAPHY CHEST WITH CONTRAST TECHNIQUE: Multidetector CT imaging of the chest was performed using the standard protocol during bolus administration of intravenous  contrast. Multiplanar CT image reconstructions and MIPs were obtained to evaluate the  vascular anatomy. RADIATION DOSE REDUCTION: This exam was performed according to the departmental dose-optimization program which includes automated exposure control, adjustment of the mA and/or kV according to patient size and/or use of iterative reconstruction technique. CONTRAST:  75mL OMNIPAQUE  IOHEXOL  350 MG/ML SOLN COMPARISON:  Chest radiograph dated 05/02/2023 and CT dated 02/17/2020. FINDINGS: Cardiovascular: There is no cardiomegaly or pericardial effusion. Moderate atherosclerotic calcification of the thoracic aorta. No aneurysmal dilatation. No pulmonary artery embolus identified. Mediastinum/Nodes: No hilar or mediastinal adenopathy. The esophagus is grossly unremarkable. No mediastinal fluid collection. Lungs/Pleura: No focal consolidation, pleural effusion, pneumothorax. There is a 3 mm subpleural nodule along the minor fissure cough present on the CT of 2021. The central airways are patent. Upper Abdomen: Cholecystectomy.  Left adrenal thickening/nodularity. Musculoskeletal: No acute osseous pathology. Review of the MIP images confirms the above findings. IMPRESSION: 1. No acute intrathoracic pathology. No CT evidence of pulmonary artery embolus. 2.  Aortic Atherosclerosis (ICD10-I70.0). Electronically Signed   By: Vanetta Chou M.D.   On: 05/02/2023 19:13   DG Chest 2 View Result Date: 05/02/2023 CLINICAL DATA:  Chest pain EXAM: CHEST - 2 VIEW COMPARISON:  Chest x-ray March 26, 2022 FINDINGS: The cardiomediastinal silhouette is unchanged in contour. No focal pulmonary opacity. No pleural effusion or pneumothorax. Surgical clips in the right upper abdomen. No acute osseous abnormality. IMPRESSION: No active cardiopulmonary disease. Electronically Signed   By: Dirk Arrant M.D.   On: 05/02/2023 11:22    EKG: 05/02/23 SR rate 74 nonspecific ST changes poor R wave progression    ASSESSMENT AND PLAN:   1. CAD:  multiple normal stress tests most recent PET/CT 04/28/22 mild 3 vessel  calcium  noted with normal perfusion , stress EF and blood flow  2. HTN:  well controlled on multi drug regimen  3.  HLD:  On statin labs with primary  4. Carotid Dx:  f/u Dr Sheree right ICA 60-79% f/u duplex 02/2023  5. GI: history of GERD and esophageal dilatation seen by GI recently and repeat EGD deferred Continue Linzess  and protonix   6. PVD:  moderate decrease in ABI on right  ? Worse symptoms in right leg Start Pletal  and expedite f/u with Dr Sheree for ABI's and possible need for angiogram  7. Ortho: left knee arthroscopy improved  8. Palpitations: on beta blocker with low pulse Likely PACls improved no need for monitor  9. Varicosities:  known bilateral reflux Diuretics f/u [facility]   F/U in a year  F/U [facility] Sheree   Start Pletal  for claudictation  Duplex October carotids  ABIs October or sooner per Dr Sheree Above to be ordered by [facility]    Signed: Maude Emmer 05/24/2023, 10:53 AM

## 2023-05-24 NOTE — Patient Instructions (Signed)
 Medication Instructions:  Your physician has recommended you make the following change in your medication:   Start Pletal  50 mg Two Times Daily   *If you need a refill on your cardiac medications before your next appointment, please call your pharmacy*   Lab Work: NONE  If you have labs (blood work) drawn today and your tests are completely normal, you will receive your results only by: MyChart Message (if you have MyChart) OR A paper copy in the mail If you have any lab test that is abnormal or we need to change your treatment, we will call you to review the results.   Testing/Procedures: NONE     Follow-Up: At [facility], you and your health needs are our priority.  As part of our continuing mission to provide you with exceptional heart care, we have created designated Provider Care Teams.  These Care Teams include your primary Cardiologist (physician) and Advanced Practice Providers (APPs -  Physician Assistants and Nurse Practitioners) who all work together to provide you with the care you need, when you need it.  We recommend signing up for the patient portal called MyChart.  Sign up information is provided on this After Visit Summary.  MyChart is used to connect with patients for Virtual Visits (Telemedicine).  Patients are able to view lab/test results, encounter notes, upcoming appointments, etc.  Non-urgent messages can be sent to your provider as well.   To learn more about what you can do with MyChart, go to [email or web address].    Your next appointment:   1 year(s)  Provider:   Maude Emmer, MD    Other Instructions Thank you for choosing [facility]!

## 2023-05-31 DIAGNOSIS — M1712 Unilateral primary osteoarthritis, left knee: Secondary | ICD-10-CM | POA: Diagnosis not present

## 2023-06-02 ENCOUNTER — Ambulatory Visit: Payer: Medicare Other | Admitting: *Deleted

## 2023-06-02 ENCOUNTER — Ambulatory Visit
Admission: RE | Admit: 2023-06-02 | Discharge: 2023-06-02 | Disposition: A | Discharge status: Home / Self Care | Payer: Medicare Other | Source: Ambulatory Visit | Attending: Physician Assistant | Admitting: Physician Assistant

## 2023-06-02 DIAGNOSIS — G43909 Migraine, unspecified, not intractable, without status migrainosus: Secondary | ICD-10-CM | POA: Diagnosis not present

## 2023-06-02 DIAGNOSIS — R410 Disorientation, unspecified: Secondary | ICD-10-CM | POA: Diagnosis not present

## 2023-06-02 DIAGNOSIS — Z0000 Encounter for general adult medical examination without abnormal findings: Secondary | ICD-10-CM

## 2023-06-02 DIAGNOSIS — R531 Weakness: Secondary | ICD-10-CM | POA: Diagnosis not present

## 2023-06-02 DIAGNOSIS — R251 Tremor, unspecified: Secondary | ICD-10-CM | POA: Diagnosis not present

## 2023-06-02 MED ORDER — GADOPICLENOL 0.5 MMOL/ML IV SOLN
7.0000 mL | Freq: Once | INTRAVENOUS | Status: AC | PRN
  Administered 2023-06-02: 7 mL via INTRAVENOUS

## 2023-06-02 NOTE — Patient Instructions (Signed)
[name] , Thank you for taking time to come for your Medicare Wellness Visit. I appreciate your ongoing commitment to your health goals. Please review the following plan we discussed and let me know if I can assist you in the future.   Screening recommendations/referrals: Colonoscopy: up to date Mammogram: up to date Bone Density: Education provided Recommended yearly ophthalmology/optometry visit for glaucoma screening and checkup Recommended yearly dental visit for hygiene and checkup  Vaccinations: Influenza vaccine: up to date Pneumococcal vaccine: up to date Tdap vaccine: up to date Shingles vaccine: up to date    Advanced directives: Education provided   Preventive Care 65 Years and Older, Female Preventive care refers to lifestyle choices and visits with your health care provider that can promote health and wellness. What does preventive care include? A yearly physical exam. This is also called an annual well check. Dental exams once or twice a year. Routine eye exams. Ask your health care provider how often you should have your eyes checked. Personal lifestyle choices, including: Daily care of your teeth and gums. Regular physical activity. Eating a healthy diet. Avoiding tobacco and drug use. Limiting alcohol use. Practicing safe sex. Taking low-dose aspirin every day. Taking vitamin and mineral supplements as recommended by your health care provider. What happens during an annual well check? The services and screenings done by your health care provider during your annual well check will depend on your age, overall health, lifestyle risk factors, and family history of disease. Counseling  Your health care provider may ask you questions about your: Alcohol use. Tobacco use. Drug use. Emotional well-being. Home and relationship well-being. Sexual activity. Eating habits. History of falls. Memory and ability to understand (cognition). Work and work  Astronomer. Reproductive health. Screening  You may have the following tests or measurements: Height, weight, and BMI. Blood pressure. Lipid and cholesterol levels. These may be checked every 5 years, or more frequently if you are over 6 years old. Skin check. Lung cancer screening. You may have this screening every year starting at age 70 if you have a 30-pack-year history of smoking and currently smoke or have quit within the past 15 years. Fecal occult blood test (FOBT) of the stool. You may have this test every year starting at age 70. Flexible sigmoidoscopy or colonoscopy. You may have a sigmoidoscopy every 5 years or a colonoscopy every 10 years starting at age 18. Hepatitis C blood test. Hepatitis B blood test. Sexually transmitted disease (STD) testing. Diabetes screening. This is done by checking your blood sugar (glucose) after you have not eaten for a while (fasting). You may have this done every 1-3 years. Bone density scan. This is done to screen for osteoporosis. You may have this done starting at age 70. Mammogram. This may be done every 1-2 years. Talk to your health care provider about how often you should have regular mammograms. Talk with your health care provider about your test results, treatment options, and if necessary, the need for more tests. Vaccines  Your health care provider may recommend certain vaccines, such as: Influenza vaccine. This is recommended every year. Tetanus, diphtheria, and acellular pertussis (Tdap, Td) vaccine. You may need a Td booster every 10 years. Zoster vaccine. You may need this after age 54. Pneumococcal 13-valent conjugate (PCV13) vaccine. One dose is recommended after age 49. Pneumococcal polysaccharide (PPSV23) vaccine. One dose is recommended after age 19. Talk to your health care provider about which screenings and vaccines you need and how often you  need them. This information is not intended to replace advice given to you by  your health care provider. Make sure you discuss any questions you have with your health care provider. Document Released: 05/23/2015 Document Revised: 01/14/2016 Document Reviewed: 02/25/2015 Elsevier Interactive Patient Education  2017 [employer].  Fall Prevention in the Home Falls can cause injuries. They can happen to people of all ages. There are many things you can do to make your home safe and to help prevent falls. What can I do on the outside of my home? Regularly fix the edges of walkways and driveways and fix any cracks. Remove anything that might make you trip as you walk through a door, such as a raised step or threshold. Trim any bushes or trees on the path to your home. Use bright outdoor lighting. Clear any walking paths of anything that might make someone trip, such as rocks or tools. Regularly check to see if handrails are loose or broken. Make sure that both sides of any steps have handrails. Any raised decks and porches should have guardrails on the edges. Have any leaves, snow, or ice cleared regularly. Use sand or salt on walking paths during winter. Clean up any spills in your garage right away. This includes oil or grease spills. What can I do in the bathroom? Use night lights. Install grab bars by the toilet and in the tub and shower. Do not use towel bars as grab bars. Use non-skid mats or decals in the tub or shower. If you need to sit down in the shower, use a plastic, non-slip stool. Keep the floor dry. Clean up any water that spills on the floor as soon as it happens. Remove soap buildup in the tub or shower regularly. Attach bath mats securely with double-sided non-slip rug tape. Do not have throw rugs and other things on the floor that can make you trip. What can I do in the bedroom? Use night lights. Make sure that you have a light by your bed that is easy to reach. Do not use any sheets or blankets that are too big for your bed. They should not hang  down onto the floor. Have a firm chair that has side arms. You can use this for support while you get dressed. Do not have throw rugs and other things on the floor that can make you trip. What can I do in the kitchen? Clean up any spills right away. Avoid walking on wet floors. Keep items that you use a lot in easy-to-reach places. If you need to reach something above you, use a strong step stool that has a grab bar. Keep electrical cords out of the way. Do not use floor polish or wax that makes floors slippery. If you must use wax, use non-skid floor wax. Do not have throw rugs and other things on the floor that can make you trip. What can I do with my stairs? Do not leave any items on the stairs. Make sure that there are handrails on both sides of the stairs and use them. Fix handrails that are broken or loose. Make sure that handrails are as long as the stairways. Check any carpeting to make sure that it is firmly attached to the stairs. Fix any carpet that is loose or worn. Avoid having throw rugs at the top or bottom of the stairs. If you do have throw rugs, attach them to the floor with carpet tape. Make sure that you have a light switch  at the top of the stairs and the bottom of the stairs. If you do not have them, ask someone to add them for you. What else can I do to help prevent falls? Wear shoes that: Do not have high heels. Have rubber bottoms. Are comfortable and fit you well. Are closed at the toe. Do not wear sandals. If you use a stepladder: Make sure that it is fully opened. Do not climb a closed stepladder. Make sure that both sides of the stepladder are locked into place. Ask someone to hold it for you, if possible. Clearly mark and make sure that you can see: Any grab bars or handrails. First and last steps. Where the edge of each step is. Use tools that help you move around (mobility aids) if they are needed. These  include: Canes. Walkers. Scooters. Crutches. Turn on the lights when you go into a dark area. Replace any light bulbs as soon as they burn out. Set up your furniture so you have a clear path. Avoid moving your furniture around. If any of your floors are uneven, fix them. If there are any pets around you, be aware of where they are. Review your medicines with your doctor. Some medicines can make you feel dizzy. This can increase your chance of falling. Ask your doctor what other things that you can do to help prevent falls. This information is not intended to replace advice given to you by your health care provider. Make sure you discuss any questions you have with your health care provider. Document Released: 02/20/2009 Document Revised: 10/02/2015 Document Reviewed: 05/31/2014 Elsevier Interactive Patient Education  2017 [employer].

## 2023-06-02 NOTE — Progress Notes (Signed)
Subjective:   [name] is a 68 y.o. female who presents for Medicare Annual (Subsequent) preventive examination.  Visit Complete: Virtual I connected with  NATAJIA SUDDRETH on 06/02/23 by a audio enabled telemedicine application and verified that I am speaking with the correct person using two identifiers.  Patient Location: Home  Provider Location: Home Office  I discussed the limitations of evaluation and management by telemedicine. The patient expressed understanding and agreed to proceed.  Vital Signs: Because this visit was a virtual/telehealth visit, some criteria may be missing or patient reported. Any vitals not documented were not able to be obtained and vitals that have been documented are patient reported.  Patient Medicare AWV questionnaire was completed by the patient on 05-30-2023; I have confirmed that all information answered by patient is correct and no changes since this date.  Unable to connect to video  Cardiac Risk Factors include: advanced age (>28men, >30 women);hypertension;family history of premature cardiovascular disease     Objective:    There were no vitals filed for this visit. There is no height or weight on file to calculate BMI.     06/02/2023   10:47 AM 05/02/2023   10:53 AM 04/11/2023    2:19 PM 03/16/2023    7:53 PM 01/20/2023    9:22 AM 08/20/2022   12:49 PM 07/19/2022    9:18 AM  Advanced Directives  Does Patient Have a Medical Advance Directive? No No No No No No No  Would patient like information on creating a medical advance directive? No - Patient declined No - Guardian declined No - Patient declined  No - Patient declined      Current Medications (verified) Outpatient Encounter Medications as of 06/02/2023  Medication Sig   acetaminophen (TYLENOL) 500 MG tablet Take 500 mg by mouth every 6 (six) hours as needed for moderate pain or headache.   alendronate (FOSAMAX) 70 MG tablet TAKE 1 TABLET(70 MG) BY MOUTH EVERY 7 DAYS  WITH A FULL GLASS OF WATER AND ON AN EMPTY STOMACH   ALPRAZolam (XANAX) 0.5 MG tablet TAKE 1 TABLET(0.5 MG) BY MOUTH TWICE DAILY AS NEEDED FOR ANXIETY   amLODipine (NORVASC) 10 MG tablet Take 10 mg by mouth daily.   aspirin EC 81 MG tablet Take 81 mg by mouth at bedtime.    Calcium 500 MG tablet Take 500 mg by mouth in the morning and at bedtime.   carvedilol (COREG) 3.125 MG tablet TAKE 1 TABLET(3.125 MG) BY MOUTH TWICE DAILY   cholecalciferol (VITAMIN D3) 25 MCG (1000 UT) tablet Take 2,000 Units by mouth daily.   cilostazol (PLETAL) 50 MG tablet Take 1 tablet (50 mg total) by mouth 2 (two) times daily.   Coenzyme Q10 (COQ10) 100 MG CAPS Take 100 mg by mouth daily.   CRANBERRY PO Take 1 each by mouth 2 (two) times a week. Chewables   Cyanocobalamin (B-12) 2500 MCG TABS Take 2,500 mcg by mouth daily.   dexlansoprazole (DEXILANT) 60 MG capsule Take 1 capsule (60 mg total) by mouth daily.   EPINEPHrine 0.3 mg/0.3 mL IJ SOAJ injection Inject 0.3 mg into the muscle as needed for anaphylaxis.   fluticasone (FLONASE) 50 MCG/ACT nasal spray Place 2 sprays into both nostrils daily.   FLUZONE HIGH-DOSE 0.5 ML injection Inject 0.5 mLs into the muscle.   gabapentin (NEURONTIN) 300 MG capsule Take 1 capsule (300 mg total) by mouth at bedtime.   hydroquinone 4 % cream Apply 1 application topically 2 (two) times  daily as needed (dark spots).   hydrOXYzine (VISTARIL) 25 MG capsule TAKE 1 CAPSULE(25 MG) BY MOUTH EVERY 8 HOURS AS NEEDED   ipratropium (ATROVENT) 0.03 % nasal spray INSTILL 2 SPRAYS IN EACH NOSTRIL EVERY 12 HOURS   loratadine (CLARITIN) 10 MG tablet Take 10 mg by mouth daily as needed for allergies.   MAGNESIUM PO Take 330 mg by mouth daily.   nortriptyline (PAMELOR) 10 MG capsule Take 1 capsule (10 mg total) by mouth at bedtime.   Omega-3 Fatty Acids (FISH OIL) 1000 MG CAPS Take 1,000 mg by mouth 3 (three) times a week.   ondansetron (ZOFRAN) 4 MG tablet TAKE 1 TABLET(4 MG) BY MOUTH EVERY 8  HOURS AS NEEDED FOR NAUSEA OR VOMITING   Plecanatide (TRULANCE) 3 MG TABS Take 3 mg by mouth daily.   promethazine (PHENERGAN) 25 MG tablet Take 1 tablet (25 mg total) by mouth every 8 (eight) hours as needed for nausea or vomiting.   Rimegepant Sulfate (NURTEC) 75 MG TBDP Take 1 tablet (75 mg total) by mouth daily as needed.   rosuvastatin (CRESTOR) 10 MG tablet TAKE 1 TABLET(10 MG) BY MOUTH DAILY   sodium chloride (OCEAN) 0.65 % SOLN nasal spray Place 1 spray into both nostrils as needed for congestion.   spironolactone (ALDACTONE) 25 MG tablet TAKE 1 TABLET(25 MG) BY MOUTH DAILY   triamcinolone cream (KENALOG) 0.1 % Apply 1 Application topically 2 (two) times daily.   valsartan (DIOVAN) 320 MG tablet TAKE 1 TABLET BY MOUTH EVERY DAY   vitamin E 400 UNIT capsule Take 400 Units by mouth 3 (three) times a week.   zinc gluconate 50 MG tablet Take 50 mg by mouth daily.   Facility-Administered Encounter Medications as of 06/02/2023  Medication   aspirin chewable tablet 324 mg   nitroGLYCERIN (NITROSTAT) SL tablet 0.4 mg    Allergies (verified) Ciprofloxacin, Latex, and Vitamin c [bioflavonoid products]   History: Past Medical History:  Diagnosis Date   Anxiety    Carotid stenosis, asymptomatic, bilateral    Cataract    Chronic left shoulder pain    Headache    Hiatal hernia    small   Hyperlipidemia    Hypertension    Osteoporosis    Pre-diabetes    Schatzki's ring    Seasonal allergies    Tremor    Past Surgical History:  Procedure Laterality Date   CARDIAC CATHETERIZATION  2005   "Ms. Monsivais has essentially normal coronary arteries and normal left ventricular function.  She will be treated empirically with anti-reflux measures"   CATARACT EXTRACTION, BILATERAL     CHOLECYSTECTOMY     COLONOSCOPY    06/16/2004   LKG:MWNUUV rectum/Diminutive polyps of the splenic flexure and the cecum/The remainder of the colonic mucosa appeared normal pathology (hyperplastic polyps).    COLONOSCOPY N/A 08/07/2015   Dr. Jena Gauss: 6 mm descending colon tubular adenoma, multiple medium-mouthed diverticula in sigmoid colon. surveillance 2022   COLONOSCOPY N/A 04/15/2021   Procedure: COLONOSCOPY;  Surgeon: Corbin Ade, MD;  [facility];  Service: Endoscopy;  Laterality: N/A;  9:30am   ESOPHAGOGASTRODUODENOSCOPY  09/11/2001   OZD:GUYQIHKVQ ring otherwise normal esophagus/Normal stomach/Normal D1 and D2 status post passage of a 56 French Maloney dilator   ESOPHAGOGASTRODUODENOSCOPY N/A 11/11/2017   Procedure: ESOPHAGOGASTRODUODENOSCOPY (EGD);  Surgeon: Corbin Ade, MD; mild Schatzki ring s/p dilation and mild erosive gastropathy.   ESOPHAGOGASTRODUODENOSCOPY (EGD) WITH ESOPHAGEAL DILATION N/A 01/25/2013   Dr. Jena Gauss- schatzki's ring- 54 F dilation.  small hiatal hernia   HYSTEROSCOPY WITH D & C N/A 03/06/2019   Procedure: DILATATION AND CURETTAGE /HYSTEROSCOPY;  Surgeon: Tilda Burrow, MD;  Location: [facility];  Service: Gynecology;  Laterality: N/A;   MALONEY DILATION N/A 11/11/2017   Procedure: Elease Hashimoto DILATION;  Surgeon: Corbin Ade, MD;  [facility]: [facility];  Service: Endoscopy;  Laterality: N/A;   POLYPECTOMY N/A 03/06/2019   Procedure: POLYPECTOMY ( REMOVAL OF ENDOMETRIAL POLYP);  Surgeon: Tilda Burrow, MD;  Location: [facility];  Service: Gynecology;  Laterality: N/A;   POLYPECTOMY  04/15/2021   Procedure: POLYPECTOMY;  Surgeon: Corbin Ade, MD;  [facility];  Service: Endoscopy;;   TUBAL LIGATION     Family History  Problem Relation Age of Onset   Diabetes Other    Diabetes Mother    Heart disease Mother    Heart disease Father    Diabetes Sister    Diabetes Brother    Hypertension Brother    Diabetes Daughter    Hypertension Maternal Grandmother    Stroke Maternal Grandfather    Early death Paternal Grandfather    Colon cancer Neg Hx    Liver disease Neg Hx    Social History   Socioeconomic History   Marital status: Single     Spouse name: Not on file   Number of children: 3   Years of education: Not on file   Highest education level: 10th grade  Occupational History   Occupation: Public affairs consultant: [employer]  Tobacco Use   Smoking status: Former    Current packs/day: 0.00    Average packs/day: 0.3 packs/day for 35.0 years (8.8 ttl pk-yrs)    Types: Cigarettes    Start date: 12/31/1977    Quit date: 12/31/2012    Years since quitting: 10.4   Smokeless tobacco: Never  Vaping Use   Vaping status: Never Used  Substance and Sexual Activity   Alcohol use: Not Currently   Drug use: No   Sexual activity: Not Currently    Partners: Male    Birth control/protection: Post-menopausal, Surgical    Comment: tubal  Other Topics Concern   Not on file  Social History Narrative   Eats all food groups.    Wears seatbelt.    Has 3 children.   Prior smoker.   Attends church.    Used to work in Designer, fashion/clothing.    Drives.   Enjoys going out with family.    Right handed   One story home   Social Drivers of Health   Financial Resource Strain: Low Risk  (06/02/2023)   Overall Financial Resource Strain (CARDIA)    Difficulty of Paying Living Expenses: Not very hard  Food Insecurity: No Food Insecurity (06/02/2023)   Hunger Vital Sign    Worried About Running Out of Food in the Last Year: Never true    Ran Out of Food in the Last Year: Never true  Transportation Needs: No Transportation Needs (06/02/2023)   PRAPARE - Administrator, Civil Service (Medical): No    Lack of Transportation (Non-Medical): No  Physical Activity: Sufficiently Active (06/02/2023)   Exercise Vital Sign    Days of Exercise per Week: 2 days    Minutes of Exercise per Session: 90 min  Recent Concern: Physical Activity - Insufficiently Active (03/18/2023)   Exercise Vital Sign    Days of Exercise per Week: 3 days    Minutes of Exercise per Session: 30 min  Stress: Stress Concern Present (06/02/2023)   [employer] of  Occupational Health - Occupational Stress Questionnaire    Feeling of Stress : To some extent  Social Connections: Moderately Integrated (06/02/2023)   Social Connection and Isolation Panel [NHANES]    Frequency of Communication with Friends and Family: More than three times a week    Frequency of Social Gatherings with Friends and Family: More than three times a week    Attends Religious Services: More than 4 times per year    Active Member of [employer] or Organizations: Yes    Attends Banker Meetings: 1 to 4 times per year    Marital Status: Divorced    Tobacco Counseling Counseling given: Not Answered   Clinical Intake:  Pre-visit preparation completed: Yes  Pain : No/denies pain     Diabetes: No  How often do you need to have someone help you when you read instructions, pamphlets, or other written materials from your doctor or pharmacy?: 1 - Never  Interpreter Needed?: No  Information entered by :: Remi Haggard LPN   Activities of Daily Living    06/02/2023   10:49 AM 05/30/2023    1:47 PM  In your present state of health, do you have any difficulty performing the following activities:  Hearing? 0 0  Vision? 0 0  Difficulty concentrating or making decisions? 0 0  Walking or climbing stairs? 0 0  Dressing or bathing? 0 0  Doing errands, shopping? 0 0  Preparing Food and eating ? N N  Using the Toilet? N N  In the past six months, have you accidently leaked urine? N N  Do you have problems with loss of bowel control? N N  Managing your Medications? N N  Managing your Finances? N N  Housekeeping or managing your Housekeeping? N N    Patient Care Team: [name], MD as PCP - General (Family Medicine) Wendall Stade, MD as PCP - Cardiology (Cardiology) Jena Gauss [name] Friends, MD as Consulting Physician (Gastroenterology) Erroll Luna, [facility] (Inactive) as Pharmacist (Pharmacist) [facility] Schlatter ([facility]  (Ophthalmology)  Indicate any recent Medical Services you may have received from other than [facility] providers in the past year (date may be approximate).     Assessment:   This is a routine wellness examination for [place].  Hearing/Vision screen Hearing Screening - Comments:: No trouble hearing Vision Screening - Comments:: [facility] Up to date   Goals Addressed             This Visit's Progress    Patient Stated       Continue Current lifestyle       Depression Screen    06/02/2023   10:50 AM 05/02/2023   10:53 AM 01/04/2023   10:01 AM 11/24/2022   12:30 PM 06/11/2022   10:30 AM 06/01/2022    2:25 PM 04/13/2022    9:10 AM  PHQ 2/9 Scores  PHQ - 2 Score 0 1 0 2 0 1 2  PHQ- 9 Score 5 6 0 13 0 3 9    Fall Risk    06/02/2023   10:48 AM 05/30/2023    1:47 PM 04/11/2023    2:19 PM 01/20/2023    9:22 AM 01/04/2023   10:01 AM  Fall Risk   Falls in the past year? 0 0 0 0 0  Number falls in past yr: 0  0 0 0  Injury with Fall? 0  0 0 0  Risk for fall due to :     No Fall Risks  Follow up Falls evaluation completed;Education provided;Falls prevention discussed  Falls evaluation completed Falls evaluation completed Falls prevention discussed    MEDICARE RISK AT HOME: Medicare Risk at Home Any stairs in or around the home?: Yes If so, are there any without handrails?: No Home free of loose throw rugs in walkways, pet beds, electrical cords, etc?: No Adequate lighting in your home to reduce risk of falls?: Yes Life alert?: No Use of a cane, walker or w/c?: No Grab bars in the bathroom?: No Shower chair or bench in shower?: No Elevated toilet seat or a handicapped toilet?: No  TIMED UP AND GO:  Was the test performed?  No    Cognitive Function:        06/02/2023   10:56 AM 06/01/2022    2:01 PM 02/05/2021   10:38 AM  6CIT Screen  What Year? 0 points 0 points 0 points  What month? 0 points 0 points 0 points  What time? 0 points 0 points 0 points  Count back from  20 0 points 0 points 0 points  Months in reverse 0 points 0 points 0 points  Repeat phrase 0 points 0 points 0 points  Total Score 0 points 0 points 0 points    Immunizations Immunization History  Administered Date(s) Administered   Fluad Quad(high Dose 65+) 02/14/2020, 02/05/2021, 05/25/2023   Influenza,inj,Quad PF,6+ Mos 02/05/2017, 02/23/2018, 02/20/2019   Moderna Sars-Covid-2 Vaccination 10/10/2019, 11/08/2019, 05/19/2020   Pneumococcal Conjugate-13 07/26/2017   Pneumococcal Polysaccharide-23 02/14/2020   Tdap 03/28/2017   Zoster Recombinant(Shingrix) 08/24/2021, 11/30/2021    TDAP status: Up to date  Flu Vaccine status: Up to date  Pneumococcal vaccine status: Up to date  Covid-19 vaccine status: Information provided on how to obtain vaccines.   Qualifies for Shingles Vaccine? No   Zostavax completed Yes   Shingrix Completed?: Yes  Screening Tests Health Maintenance  Topic Date Due   FOOT EXAM  04/14/2023   COVID-19 Vaccine (4 - 2024-25 season) 06/21/2023 (Originally 01/09/2023)   HEMOGLOBIN A1C  06/24/2023   Diabetic kidney evaluation - Urine ACR  12/22/2023   OPHTHALMOLOGY EXAM  12/22/2023   Diabetic kidney evaluation - eGFR measurement  05/01/2024   Medicare Annual Wellness (AWV)  06/01/2024   MAMMOGRAM  01/27/2025   DTaP/Tdap/Td (2 - Td or Tdap) 03/29/2027   Colonoscopy  04/16/2031   Pneumonia Vaccine 21+ Years old  Completed   INFLUENZA VACCINE  Completed   DEXA SCAN  Completed   Hepatitis C Screening  Completed   Zoster Vaccines- Shingrix  Completed   HPV VACCINES  Aged Out    Health Maintenance  Health Maintenance Due  Topic Date Due   FOOT EXAM  04/14/2023    Colorectal cancer screening: Type of screening: Colonoscopy. Completed 2022. Repeat every 10 years  Mammogram status: Completed  . Repeat every year  Bone Density  patient declined  Lung Cancer Screening: (Low Dose CT Chest recommended if Age 30-80 years, 20 pack-year currently smoking  OR have quit w/in 15years.) does not qualify.   Lung Cancer Screening Referral:   Additional Screening:  Hepatitis C Screening: does not qualify; Completed 2018  Vision Screening: Recommended annual ophthalmology exams for early detection of glaucoma and other disorders of the eye. Is the patient up to date with their annual eye exam?  Yes  Who is the provider or what is the name of the  office in which the patient attends annual eye exams? [facility] If pt is not established with a provider, would they like to be referred to a provider to establish care? No .   Dental Screening: Recommended annual dental exams for proper oral hygiene   Community Resource Referral / Chronic Care Management: CRR required this visit?  No   CCM required this visit?  No     Plan:     I have personally reviewed and noted the following in the patient's chart:   Medical and social history Use of alcohol, tobacco or illicit drugs  Current medications and supplements including opioid prescriptions. Patient is not currently taking opioid prescriptions. Functional ability and status Nutritional status Physical activity Advanced directives List of other physicians Hospitalizations, surgeries, and ER visits in previous 12 months Vitals Screenings to include cognitive, depression, and falls Referrals and appointments  In addition, I have reviewed and discussed with patient certain preventive protocols, quality metrics, and best practice recommendations. A written personalized care plan for preventive services as well as general preventive health recommendations were provided to patient.     Remi Haggard, LPN   1/61/0960   After Visit Summary: (MyChart) Due to this being a telephonic visit, the after visit summary with patients personalized plan was offered to patient via MyChart   Nurse Notes:

## 2023-06-08 DIAGNOSIS — E119 Type 2 diabetes mellitus without complications: Secondary | ICD-10-CM | POA: Diagnosis not present

## 2023-06-08 NOTE — Progress Notes (Signed)
Patient already informed of the findings of the MRI.  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  She will discuss that with her ophthalmologist, and she was very appreciative of the call..  No need to call the patient

## 2023-06-11 ENCOUNTER — Other Ambulatory Visit: Payer: Self-pay | Admitting: Family Medicine

## 2023-06-13 NOTE — Telephone Encounter (Signed)
Pharmacy sent second refill request via eFax for spironolactone (ALDACTONE) 25 MG tablet

## 2023-06-13 NOTE — Telephone Encounter (Signed)
Requested medication (s) are due for refill today: yes   Requested medication (s) are on the active medication list: yes   Last refill:  02/24/23 #90 0 refills  Future visit scheduled: no   Notes to clinic:   no refills remain. Do you want to refill Rx?     Requested Prescriptions  Pending Prescriptions Disp Refills   spironolactone (ALDACTONE) 25 MG tablet [Pharmacy Med Name: SPIRONOLACTONE 25MG  TABLETS] 90 tablet 0    Sig: TAKE 1 TABLET(25 MG) BY MOUTH DAILY     Cardiovascular: Diuretics - Aldosterone Antagonist Failed - 06/13/2023  3:24 PM      Failed - Valid encounter within last 6 months    Recent Outpatient Visits           1 year ago Unilateral headache   [facility] [name], MD   1 year ago Lumbar radiculopathy, chronic   [facility] Pickard, Priscille Heidelberg, MD   1 year ago Viral upper respiratory tract infection   [facility] [name], MD   2 years ago Abnormal urine color   [facility] Tanya Nones, Priscille Heidelberg, MD   2 years ago Chest wall pain   [facility] Pickard, Priscille Heidelberg, MD              Passed - Cr in normal range and within 180 days    Creat  Date Value Ref Range Status  12/22/2022 1.09 (H) 0.50 - 1.05 mg/dL Final   Creatinine, Ser  Date Value Ref Range Status  05/02/2023 0.85 0.44 - 1.00 mg/dL Final   Creatinine, Urine  Date Value Ref Range Status  12/22/2022 114 20 - 275 mg/dL Final         Passed - K in normal range and within 180 days    Potassium  Date Value Ref Range Status  05/02/2023 4.0 3.5 - 5.1 mmol/L Final  11/16/2012 4.1 mmol/L Final         Passed - Na in normal range and within 180 days    Sodium  Date Value Ref Range Status  05/02/2023 137 135 - 145 mmol/L Final  11/16/2012 141 137 - 147 mmol/L Final         Passed - eGFR is 30 or above and within 180 days    GFR, Est African American  Date Value Ref Range Status  06/10/2020 74  > OR = 60 mL/min/1.8m2 Final   GFR, Est Non African American  Date Value Ref Range Status  06/10/2020 64 > OR = 60 mL/min/1.83m2 Final   GFR, Estimated  Date Value Ref Range Status  05/02/2023 >60 >60 mL/min Final    Comment:    (NOTE) Calculated using the CKD-EPI Creatinine Equation (2021)    eGFR  Date Value Ref Range Status  06/11/2022 68 > OR = 60 mL/min/1.11m2 Final         Passed - Last BP in normal range    BP Readings from Last 1 Encounters:  05/24/23 138/70

## 2023-06-14 ENCOUNTER — Other Ambulatory Visit: Payer: Self-pay

## 2023-06-14 DIAGNOSIS — I739 Peripheral vascular disease, unspecified: Secondary | ICD-10-CM

## 2023-06-15 ENCOUNTER — Other Ambulatory Visit: Payer: Self-pay | Admitting: Family Medicine

## 2023-06-15 NOTE — Telephone Encounter (Signed)
 Requested medication (s) are due for refill today: na  Requested medication (s) are on the active medication list: yes  Last refill:  02/24/23 #90 0 refills  Future visit scheduled: no  Notes to clinic:  patient requesting refills. No refills remain. Last OV 03/18/23. Do you want to refill Rx?     Requested Prescriptions  Pending Prescriptions Disp Refills   spironolactone  (ALDACTONE ) 25 MG tablet 90 tablet 0     Cardiovascular: Diuretics - Aldosterone Antagonist Failed - 06/15/2023 12:17 PM      Failed - Valid encounter within last 6 months    Recent Outpatient Visits           1 year ago Unilateral headache   [facility] Duanne Butler DASEN, MD   1 year ago Lumbar radiculopathy, chronic   [facility] Pickard, Butler DASEN, MD   1 year ago Viral upper respiratory tract infection   [facility] Duanne Butler DASEN, MD   2 years ago Abnormal urine color   [facility] Duanne, Butler DASEN, MD   2 years ago Chest wall pain   [facility] Duanne, Butler DASEN, MD              Passed - Cr in normal range and within 180 days    Creat  Date Value Ref Range Status  12/22/2022 1.09 (H) 0.50 - 1.05 mg/dL Final   Creatinine, Ser  Date Value Ref Range Status  05/02/2023 0.85 0.44 - 1.00 mg/dL Final   Creatinine, Urine  Date Value Ref Range Status  12/22/2022 114 20 - 275 mg/dL Final         Passed - K in normal range and within 180 days    Potassium  Date Value Ref Range Status  05/02/2023 4.0 3.5 - 5.1 mmol/L Final  11/16/2012 4.1 mmol/L Final         Passed - Na in normal range and within 180 days    Sodium  Date Value Ref Range Status  05/02/2023 137 135 - 145 mmol/L Final  11/16/2012 141 137 - 147 mmol/L Final         Passed - eGFR is 30 or above and within 180 days    GFR, Est African American  Date Value Ref Range Status  06/10/2020 74 > OR = 60 mL/min/1.34m2 Final   GFR, Est Non African  American  Date Value Ref Range Status  06/10/2020 64 > OR = 60 mL/min/1.19m2 Final   GFR, Estimated  Date Value Ref Range Status  05/02/2023 >60 >60 mL/min Final    Comment:    (NOTE) Calculated using the CKD-EPI Creatinine Equation (2021)    eGFR  Date Value Ref Range Status  06/11/2022 68 > OR = 60 mL/min/1.73m2 Final         Passed - Last BP in normal range    BP Readings from Last 1 Encounters:  05/24/23 138/70

## 2023-06-15 NOTE — Telephone Encounter (Signed)
 Pharmacy sent 2nd refill request for spironolactone  (ALDACTONE ) 25 MG tablet   LOV 03/18/2023.  Pharmacy:  [id number] - [place], [place] - [address] SEC [address], [place] [address] Phone: [phone]  Fax: [phone] DEA #: [id number]    Please advise pharmacist.

## 2023-06-15 NOTE — Telephone Encounter (Signed)
 Requested medication (s) are due for refill today: 2nd request from pharmacy for refill  Requested medication (s) are on the active medication list: yes   Last refill:  02/24/23 #90 0 refills  Future visit scheduled: no   Notes to clinic:  2nd request for refill . Last labs 05/02/23 Last OV 03/18/23. No refills remain. Do you want to refill Rx?     Requested Prescriptions  Pending Prescriptions Disp Refills   spironolactone  (ALDACTONE ) 25 MG tablet 90 tablet 0     Cardiovascular: Diuretics - Aldosterone Antagonist Failed - 06/15/2023  4:23 PM      Failed - Valid encounter within last 6 months    Recent Outpatient Visits           1 year ago Unilateral headache   [facility] Duanne Butler DASEN, MD   1 year ago Lumbar radiculopathy, chronic   [facility] Pickard, Butler DASEN, MD   1 year ago Viral upper respiratory tract infection   [facility] Duanne Butler DASEN, MD   2 years ago Abnormal urine color   [facility] Duanne, Butler DASEN, MD   2 years ago Chest wall pain   [facility] Duanne, Butler DASEN, MD              Passed - Cr in normal range and within 180 days    Creat  Date Value Ref Range Status  12/22/2022 1.09 (H) 0.50 - 1.05 mg/dL Final   Creatinine, Ser  Date Value Ref Range Status  05/02/2023 0.85 0.44 - 1.00 mg/dL Final   Creatinine, Urine  Date Value Ref Range Status  12/22/2022 114 20 - 275 mg/dL Final         Passed - K in normal range and within 180 days    Potassium  Date Value Ref Range Status  05/02/2023 4.0 3.5 - 5.1 mmol/L Final  11/16/2012 4.1 mmol/L Final         Passed - Na in normal range and within 180 days    Sodium  Date Value Ref Range Status  05/02/2023 137 135 - 145 mmol/L Final  11/16/2012 141 137 - 147 mmol/L Final         Passed - eGFR is 30 or above and within 180 days    GFR, Est African American  Date Value Ref Range Status  06/10/2020 74 >  OR = 60 mL/min/1.76m2 Final   GFR, Est Non African American  Date Value Ref Range Status  06/10/2020 64 > OR = 60 mL/min/1.51m2 Final   GFR, Estimated  Date Value Ref Range Status  05/02/2023 >60 >60 mL/min Final    Comment:    (NOTE) Calculated using the CKD-EPI Creatinine Equation (2021)    eGFR  Date Value Ref Range Status  06/11/2022 68 > OR = 60 mL/min/1.75m2 Final         Passed - Last BP in normal range    BP Readings from Last 1 Encounters:  05/24/23 138/70

## 2023-06-15 NOTE — Telephone Encounter (Signed)
 Copied from CRM [id number]. Topic: Clinical - Medication Refill >> Jun 15, 2023  9:05 AM Graeme ORN wrote: Most Recent Primary Care Visit:  Provider: GREER, JULIE M  Department: [facility]  Visit Type: MEDICARE AWV, SEQUENTIAL  Date: 06/02/2023  Medication: spironolactone  (ALDACTONE ) 25 MG tablet  Has the patient contacted their pharmacy? Yes (Agent: If no, request that the patient contact the pharmacy for the refill. If patient does not wish to contact the pharmacy document the reason why and proceed with request.) (Agent: If yes, when and what did the pharmacy advise?) has requested more than once and no response   Is this the correct pharmacy for this prescription? Yes If no, delete pharmacy and type the correct one.  This is the patient's preferred pharmacy:  [id number] - [place], [place] - [address] [place] [address] Phone: [phone] Fax: [phone]  Has the prescription been filled recently? No  Is the patient out of the medication? 1 left   Has the patient been seen for an appointment in the last year OR does the patient have an upcoming appointment? Yes 05/02/2023  Can we respond through MyChart? No  Agent: Please be advised that Rx refills may take up to 3 business days. We ask that you follow-up with your pharmacy.

## 2023-06-17 ENCOUNTER — Ambulatory Visit: Payer: Medicare Other | Attending: Cardiovascular Disease

## 2023-06-17 ENCOUNTER — Telehealth: Payer: Self-pay | Admitting: Cardiovascular Disease

## 2023-06-17 DIAGNOSIS — I493 Ventricular premature depolarization: Secondary | ICD-10-CM

## 2023-06-17 DIAGNOSIS — R002 Palpitations: Secondary | ICD-10-CM

## 2023-06-17 NOTE — Telephone Encounter (Signed)
 Pt reports that she start taking Pletal  around 05/29/23. Three days ago she noticed that her Hr was elevated at 103. She c/o palpitations, fatigue and SOB with elevated Hr. Pt reports that today her BP is 119/65 with Hr 109. Please advise.

## 2023-06-17 NOTE — Telephone Encounter (Signed)
 STAT if HR is under 50 or over 120 (normal HR is 60-100 beats per minute)  What is your heart rate? 109   Do you have a log of your heart rate readings (document readings)?  Ranging between 102-109 for the past 3 days   Do you have any other symptoms? No    Pt states she thinks the Cilostazol  is causing her hr to be high. Please advise.

## 2023-06-17 NOTE — Telephone Encounter (Signed)
 Spoke with pt and informed of Dr. Francie Irani response.

## 2023-06-21 ENCOUNTER — Telehealth: Payer: Self-pay

## 2023-06-21 ENCOUNTER — Other Ambulatory Visit: Payer: Self-pay

## 2023-06-21 DIAGNOSIS — I10 Essential (primary) hypertension: Secondary | ICD-10-CM

## 2023-06-21 MED ORDER — SPIRONOLACTONE 25 MG PO TABS: 25.0000 mg | ORAL_TABLET | Freq: Every day | ORAL | 1 refills | Status: DC

## 2023-06-21 NOTE — Telephone Encounter (Signed)
Copied from CRM [id number]. Topic: Clinical - Medication Question >> Jun 21, 2023 11:44 AM Gery Pray wrote: Reason for CRM: Patient stated her Pharmacy called and stated that the spironolactone (ALDACTONE) 25 MG tablet prescription was denied. Please advise on what patient's next step should be.

## 2023-06-21 NOTE — Telephone Encounter (Signed)
Copied from CRM [id number]. Topic: Clinical - Prescription Issue >> Jun 21, 2023  9:26 AM Geroge Baseman wrote: Reason for CRM: spironolactone (ALDACTONE) 25 MG tablet, patient been waiting on this medication and she has been out for 3 days. She states the pharmacy said they do not have the refill. Please call to advise.

## 2023-06-22 DIAGNOSIS — R002 Palpitations: Secondary | ICD-10-CM | POA: Diagnosis not present

## 2023-06-22 DIAGNOSIS — I493 Ventricular premature depolarization: Secondary | ICD-10-CM | POA: Diagnosis not present

## 2023-06-25 ENCOUNTER — Other Ambulatory Visit: Payer: Medicare Other

## 2023-06-27 DIAGNOSIS — J340 Abscess, furuncle and carbuncle of nose: Secondary | ICD-10-CM | POA: Diagnosis not present

## 2023-06-27 DIAGNOSIS — R519 Headache, unspecified: Secondary | ICD-10-CM | POA: Diagnosis not present

## 2023-06-29 ENCOUNTER — Ambulatory Visit (HOSPITAL_COMMUNITY)
Admission: RE | Admit: 2023-06-29 | Discharge: 2023-06-29 | Disposition: A | Discharge status: Home / Self Care | Payer: Medicare Other | Source: Ambulatory Visit | Attending: Vascular Surgery | Admitting: Vascular Surgery

## 2023-06-29 ENCOUNTER — Ambulatory Visit (INDEPENDENT_AMBULATORY_CARE_PROVIDER_SITE_OTHER)
Admission: RE | Admit: 2023-06-29 | Discharge: 2023-06-29 | Discharge status: Home / Self Care | Payer: Medicare Other | Source: Ambulatory Visit | Attending: Vascular Surgery

## 2023-06-29 ENCOUNTER — Ambulatory Visit (INDEPENDENT_AMBULATORY_CARE_PROVIDER_SITE_OTHER): Payer: Medicare Other | Admitting: Vascular Surgery

## 2023-06-29 ENCOUNTER — Encounter: Payer: Self-pay | Admitting: Vascular Surgery

## 2023-06-29 VITALS — BP 147/74 | HR 71 | Temp 97.9°F | Resp 20 | Ht 66.0 in | Wt 160.7 lb

## 2023-06-29 DIAGNOSIS — I739 Peripheral vascular disease, unspecified: Secondary | ICD-10-CM

## 2023-06-29 DIAGNOSIS — I70221 Atherosclerosis of native arteries of extremities with rest pain, right leg: Secondary | ICD-10-CM | POA: Diagnosis not present

## 2023-06-29 LAB — VAS US ABI WITH/WO TBI
Left ABI: 0.71
Right ABI: 0.43

## 2023-06-29 NOTE — H&P (View-Only) (Signed)
 Patient ID: [name], female   DOB: October 16, 1955, 68 y.o.   MRN: [id number]  Reason for Consult: Follow-up   Referred by [name], MD  Subjective:     HPI:  [name] is a 68 y.o. female history of carotid artery disease and also life limiting right greater than left lower extremity claudication.  She was lastevaluated here in October and now here follows up for further evaluation.  She does not have any tissue loss or ulceration but she states that she does have significant pain in her right lower extremity particularly when walking and at night and she states there are no alleviating factors.  She is a former smoker quit over 10 years ago.  She remains on aspirin and a statin.   Past Medical History:  Diagnosis Date   Anxiety    Carotid stenosis, asymptomatic, bilateral    Cataract    Chronic left shoulder pain    Headache    Hiatal hernia    small   Hyperlipidemia    Hypertension    Osteoporosis    Pre-diabetes    Schatzki's ring    Seasonal allergies    Tremor    Family History  Problem Relation Age of Onset   Diabetes Other    Diabetes Mother    Heart disease Mother    Heart disease Father    Diabetes Sister    Diabetes Brother    Hypertension Brother    Diabetes Daughter    Hypertension Maternal Grandmother    Stroke Maternal Grandfather    Early death Paternal Grandfather    Colon cancer Neg Hx    Liver disease Neg Hx    Past Surgical History:  Procedure Laterality Date   CARDIAC CATHETERIZATION  2005   "Ms. Tenny has essentially normal coronary arteries and normal left ventricular function.  She will be treated empirically with anti-reflux measures"   CATARACT EXTRACTION, BILATERAL     CHOLECYSTECTOMY     COLONOSCOPY    06/16/2004   JYN:WGNFAO rectum/Diminutive polyps of the splenic flexure and the cecum/The remainder of the colonic mucosa appeared normal pathology (hyperplastic polyps).   COLONOSCOPY N/A 08/07/2015   Dr.  Jena Gauss: 6 mm descending colon tubular adenoma, multiple medium-mouthed diverticula in sigmoid colon. surveillance 2022   COLONOSCOPY N/A 04/15/2021   Procedure: COLONOSCOPY;  Surgeon: Corbin Ade, MD;  Location: [facility];  Service: Endoscopy;  Laterality: N/A;  9:30am   ESOPHAGOGASTRODUODENOSCOPY  09/11/2001   ZHY:[id number] ring otherwise normal esophagus/Normal stomach/Normal D1 and D2 status post passage of a 56 French Maloney dilator   ESOPHAGOGASTRODUODENOSCOPY N/A 11/11/2017   Procedure: ESOPHAGOGASTRODUODENOSCOPY (EGD);  Surgeon: Corbin Ade, MD; mild Schatzki ring s/p dilation and mild erosive gastropathy.   ESOPHAGOGASTRODUODENOSCOPY (EGD) WITH ESOPHAGEAL DILATION N/A 01/25/2013   Dr. Jena Gauss- schatzki's ring- 54 F dilation. small hiatal hernia   HYSTEROSCOPY WITH D & C N/A 03/06/2019   Procedure: DILATATION AND CURETTAGE /HYSTEROSCOPY;  Surgeon: Tilda Burrow, MD;  Location: [facility];  Service: Gynecology;  Laterality: N/A;   MALONEY DILATION N/A 11/11/2017   Procedure: Elease Hashimoto DILATION;  Surgeon: Corbin Ade, MD;  Location: [facility];  Service: Endoscopy;  Laterality: N/A;   POLYPECTOMY N/A 03/06/2019   Procedure: POLYPECTOMY ( REMOVAL OF ENDOMETRIAL POLYP);  Surgeon: Tilda Burrow, MD;  Location: [facility];  Service: Gynecology;  Laterality: N/A;   POLYPECTOMY  04/15/2021   Procedure: POLYPECTOMY;  Surgeon: Corbin Ade, MD;  Location:  [facility];  Service: Endoscopy;;   TUBAL LIGATION      Short Social History:  Social History   Tobacco Use   Smoking status: Former    Current packs/day: 0.00    Average packs/day: 0.3 packs/day for 35.0 years (8.8 ttl pk-yrs)    Types: Cigarettes    Start date: 12/31/1977    Quit date: 12/31/2012    Years since quitting: 10.4   Smokeless tobacco: Never  Substance Use Topics   Alcohol use: Not Currently    Allergies  Allergen Reactions   Ciprofloxacin Shortness Of Breath   Latex Other (See Comments)    Burns skin    Vitamin C [Bioflavonoid Products] Itching    Current Outpatient Medications  Medication Sig Dispense Refill   acetaminophen (TYLENOL) 500 MG tablet Take 500 mg by mouth every 6 (six) hours as needed for moderate pain or headache.     alendronate (FOSAMAX) 70 MG tablet TAKE 1 TABLET(70 MG) BY MOUTH EVERY 7 DAYS WITH A FULL GLASS OF WATER AND ON AN EMPTY STOMACH 4 tablet 11   ALPRAZolam (XANAX) 0.5 MG tablet TAKE 1 TABLET(0.5 MG) BY MOUTH TWICE DAILY AS NEEDED FOR ANXIETY 180 tablet 0   amLODipine (NORVASC) 10 MG tablet Take 10 mg by mouth daily.     aspirin EC 81 MG tablet Take 81 mg by mouth at bedtime.      Calcium 500 MG tablet Take 500 mg by mouth in the morning and at bedtime.     carvedilol (COREG) 3.125 MG tablet TAKE 1 TABLET(3.125 MG) BY MOUTH TWICE DAILY 180 tablet 0   cholecalciferol (VITAMIN D3) 25 MCG (1000 UT) tablet Take 2,000 Units by mouth daily.     cilostazol (PLETAL) 50 MG tablet Take 1 tablet (50 mg total) by mouth 2 (two) times daily. 60 tablet 11   Coenzyme Q10 (COQ10) 100 MG CAPS Take 100 mg by mouth daily.     CRANBERRY PO Take 1 each by mouth 2 (two) times a week. Chewables     Cyanocobalamin (B-12) 2500 MCG TABS Take 2,500 mcg by mouth daily.     dexlansoprazole (DEXILANT) 60 MG capsule Take 1 capsule (60 mg total) by mouth daily. 30 capsule 11   EPINEPHrine 0.3 mg/0.3 mL IJ SOAJ injection Inject 0.3 mg into the muscle as needed for anaphylaxis. 1 each 1   fluticasone (FLONASE) 50 MCG/ACT nasal spray Place 2 sprays into both nostrils daily.     FLUZONE HIGH-DOSE 0.5 ML injection Inject 0.5 mLs into the muscle.     hydroquinone 4 % cream Apply 1 application topically 2 (two) times daily as needed (dark spots).     hydrOXYzine (VISTARIL) 25 MG capsule TAKE 1 CAPSULE(25 MG) BY MOUTH EVERY 8 HOURS AS NEEDED 90 capsule 2   ipratropium (ATROVENT) 0.03 % nasal spray INSTILL 2 SPRAYS IN EACH NOSTRIL EVERY 12 HOURS 30 mL 12   loratadine (CLARITIN) 10 MG tablet Take 10 mg by  mouth daily as needed for allergies.     MAGNESIUM PO Take 330 mg by mouth daily.     nortriptyline (PAMELOR) 10 MG capsule Take 1 capsule (10 mg total) by mouth at bedtime. 90 capsule 3   Omega-3 Fatty Acids (FISH OIL) 1000 MG CAPS Take 1,000 mg by mouth 3 (three) times a week.     ondansetron (ZOFRAN) 4 MG tablet TAKE 1 TABLET(4 MG) BY MOUTH EVERY 8 HOURS AS NEEDED FOR NAUSEA OR VOMITING 30 tablet 0  Plecanatide (TRULANCE) 3 MG TABS Take 3 mg by mouth daily. 30 tablet 11   rosuvastatin (CRESTOR) 10 MG tablet TAKE 1 TABLET(10 MG) BY MOUTH DAILY 90 tablet 0   sodium chloride (OCEAN) 0.65 % SOLN nasal spray Place 1 spray into both nostrils as needed for congestion.     spironolactone (ALDACTONE) 25 MG tablet Take 1 tablet (25 mg total) by mouth daily. 90 tablet 1   triamcinolone cream (KENALOG) 0.1 % Apply 1 Application topically 2 (two) times daily. 30 g 0   valsartan (DIOVAN) 320 MG tablet TAKE 1 TABLET BY MOUTH EVERY DAY 90 tablet 1   vitamin E 400 UNIT capsule Take 400 Units by mouth 3 (three) times a week.     zinc gluconate 50 MG tablet Take 50 mg by mouth daily.     Current Facility-Administered Medications  Medication Dose Route Frequency Provider Last Rate Last Admin   aspirin chewable tablet 324 mg  324 mg Oral Once        nitroGLYCERIN (NITROSTAT) SL tablet 0.4 mg  0.4 mg Sublingual Once         Review of Systems  Constitutional:  Constitutional negative. HENT: HENT negative.  Eyes: Eyes negative.  Cardiovascular: Positive for claudication.  GI: Gastrointestinal negative.  Musculoskeletal: Positive for leg pain.  Skin: Skin negative.  Neurological: Neurological negative. Hematologic: Hematologic/lymphatic negative.  Psychiatric: Psychiatric negative.        Objective:  Objective   Vitals:   06/29/23 1205  BP: (!) 147/74  Pulse: 71  Resp: 20  Temp: 97.9 F (36.6 C)  SpO2: 98%  Weight: 160 lb 11.2 oz (72.9 kg)  Height: 5\' 6"  (1.676 m)   Body mass index is  25.94 kg/m.  Physical Exam HENT:     Head: Normocephalic.     Nose: Nose normal.  Eyes:     Pupils: Pupils are equal, round, and reactive to light.  Neck:     Vascular: Carotid bruit present.  Cardiovascular:     Pulses:          Femoral pulses are 0 on the right side and 1+ on the left side.      Dorsalis pedis pulses are 0 on the right side and 0 on the left side.       Posterior tibial pulses are 0 on the right side and 0 on the left side.     Heart sounds: Murmur heard.  Pulmonary:     Effort: Pulmonary effort is normal.  Abdominal:     General: Abdomen is flat.  Musculoskeletal:     Right lower leg: No edema.     Left lower leg: No edema.  Skin:    General: Skin is warm.     Capillary Refill: Capillary refill takes less than 2 seconds.  Neurological:     General: No focal deficit present.     Mental Status: She is alert.  Psychiatric:        Mood and Affect: Mood normal.        Thought Content: Thought content normal.        Judgment: Judgment normal.     Data: ABI Findings:  +---------+------------------+-----+----------+--------+  Right   Rt Pressure (mmHg)IndexWaveform  Comment   +---------+------------------+-----+----------+--------+  Brachial 127                                        +---------+------------------+-----+----------+--------+  PTA     55                0.43 monophasic          +---------+------------------+-----+----------+--------+  DP      49                0.39 monophasic          +---------+------------------+-----+----------+--------+  Great Toe59                0.46 Abnormal            +---------+------------------+-----+----------+--------+   +---------+------------------+-----+--------+-------+  Left    Lt Pressure (mmHg)IndexWaveformComment  +---------+------------------+-----+--------+-------+  Brachial 126                                      +---------+------------------+-----+--------+-------+  PTA     90                0.71 biphasic         +---------+------------------+-----+--------+-------+  DP      81                0.64 biphasic         +---------+------------------+-----+--------+-------+  Great Toe58                0.46 Abnormal         +---------+------------------+-----+--------+-------+   +-------+-----------+-----------+------------+------------+  ABI/TBIToday's ABIToday's TBIPrevious ABIPrevious TBI  +-------+-----------+-----------+------------+------------+  Right 0.43       0.46       0.62        0.49          +-------+-----------+-----------+------------+------------+  Left  0.71       0.46       0.81        0.55          +-------+-----------+-----------+------------+------------+  .   Summary:  Right: Resting right ankle-brachial index indicates severe right lower  extremity arterial disease. The right toe-brachial index is abnormal.   Left: Resting left ankle-brachial index indicates moderate left lower  extremity arterial disease. The left toe-brachial index is abnormal.   Bilateral ABIs appear decreased compared to prior study on 03/09/2023.  Left TBIs appear decreased compared to prior study on 03/09/2023.   Aorta:  +------+-------+----------+----------+--------+--------+-----+       AP (cm)Trans (cm)PSV (cm/s)WaveformThrombusShape  +------+-------+----------+----------+--------+--------+-----+  Distal45.00                                            +------+-------+----------+----------+--------+--------+-----+     +-----------+--------+-----+---------------+----------+--------+  RIGHT     PSV cm/sRatioStenosis       Waveform  Comments  +-----------+--------+-----+---------------+----------+--------+  CIA Prox   638          75-99% stenosis                    +-----------+--------+-----+---------------+----------+--------+   CIA Distal 127                         monophasic          +-----------+--------+-----+---------------+----------+--------+  EIA Mid    57                          monophasic          +-----------+--------+-----+---------------+----------+--------+  CFA Distal 78                          monophasic          +-----------+--------+-----+---------------+----------+--------+  DFA       82                          monophasic          +-----------+--------+-----+---------------+----------+--------+  SFA Prox   50                          monophasic          +-----------+--------+-----+---------------+----------+--------+  SFA Mid    87                          monophasic          +-----------+--------+-----+---------------+----------+--------+  SFA Distal 284          50-74% stenosismonophasic          +-----------+--------+-----+---------------+----------+--------+  POP Prox   37                          monophasic          +-----------+--------+-----+---------------+----------+--------+  POP Distal 47                          monophasic          +-----------+--------+-----+---------------+----------+--------+  ATA Distal 20                          monophasic          +-----------+--------+-----+---------------+----------+--------+  PTA Distal 24                          monophasic          +-----------+--------+-----+---------------+----------+--------+  PERO Distal24                          monophasic          +-----------+--------+-----+---------------+----------+--------+     Summary:  Right:   - High grade stenosis (75-99%) of the right common iliac artery versus  focal occlusion with immediate reconstitution.  - 50-74% stenosis of the right distal superficial femoral artery.  - Monophasic waveforms observed throughout right lower extremity.   - Distal aorta appears patent with normal velocities.          Assessment/Plan:     68 year old female with known carotid artery stenosis followed every 6 months now here for reevaluation of right lower extremity claudication now demonstrating rest pain with severely depressed ABI and multilevel occlusive disease beginning at the common iliac artery on the right and also in the right SFA.  We discussed beginning with angiography from the left common femoral approach where she does have a palpable pulse possibly will need bilateral common femoral approach.  We also discussed that she may not be a candidate for endovascular intervention and may require CT with consideration of more invasive procedures including aortobifemoral bypass.  She may also require stenting of the right SFA given that she currently has rest pain and may also require bypass.  She demonstrates good understanding of all this we will schedule her  for aortogram from the left common femoral approach on Monday in the near future.  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 The patient is on best medical therapy for peripheral arterial disease. The patient has been counseled about the risks of tobacco use in atherosclerotic disease. The patient has been counseled to abstain from any tobacco use. An aortogram with bilateral lower extremity runoff angiography and Right lower extremity intervention and is indicated to better evaluate the patient's lower extremity circulation because of the limb threatening nature of the patient's diagnosis. Based on the patient's clinical exam and non-invasive data, we anticipate an endovascular intervention in the terminal aortic, iliac, and femoropopliteal vessels. Stenting and/or athrectomy would be favored because of the improved primary patency of these interventions as compared to plain balloon angioplasty.     Maeola Harman MD [facility]

## 2023-06-29 NOTE — Progress Notes (Signed)
Patient ID: [name], female   DOB: October 16, 1955, 68 y.o.   MRN: [id number]  Reason for Consult: Follow-up   Referred by [name], MD  Subjective:     HPI:  [name] is a 68 y.o. female history of carotid artery disease and also life limiting right greater than left lower extremity claudication.  She was lastevaluated here in October and now here follows up for further evaluation.  She does not have any tissue loss or ulceration but she states that she does have significant pain in her right lower extremity particularly when walking and at night and she states there are no alleviating factors.  She is a former smoker quit over 10 years ago.  She remains on aspirin and a statin.   Past Medical History:  Diagnosis Date   Anxiety    Carotid stenosis, asymptomatic, bilateral    Cataract    Chronic left shoulder pain    Headache    Hiatal hernia    small   Hyperlipidemia    Hypertension    Osteoporosis    Pre-diabetes    Schatzki's ring    Seasonal allergies    Tremor    Family History  Problem Relation Age of Onset   Diabetes Other    Diabetes Mother    Heart disease Mother    Heart disease Father    Diabetes Sister    Diabetes Brother    Hypertension Brother    Diabetes Daughter    Hypertension Maternal Grandmother    Stroke Maternal Grandfather    Early death Paternal Grandfather    Colon cancer Neg Hx    Liver disease Neg Hx    Past Surgical History:  Procedure Laterality Date   CARDIAC CATHETERIZATION  2005   "Ms. Tenny has essentially normal coronary arteries and normal left ventricular function.  She will be treated empirically with anti-reflux measures"   CATARACT EXTRACTION, BILATERAL     CHOLECYSTECTOMY     COLONOSCOPY    06/16/2004   JYN:WGNFAO rectum/Diminutive polyps of the splenic flexure and the cecum/The remainder of the colonic mucosa appeared normal pathology (hyperplastic polyps).   COLONOSCOPY N/A 08/07/2015   Dr.  Jena Gauss: 6 mm descending colon tubular adenoma, multiple medium-mouthed diverticula in sigmoid colon. surveillance 2022   COLONOSCOPY N/A 04/15/2021   Procedure: COLONOSCOPY;  Surgeon: Corbin Ade, MD;  Location: [facility];  Service: Endoscopy;  Laterality: N/A;  9:30am   ESOPHAGOGASTRODUODENOSCOPY  09/11/2001   ZHY:[id number] ring otherwise normal esophagus/Normal stomach/Normal D1 and D2 status post passage of a 56 French Maloney dilator   ESOPHAGOGASTRODUODENOSCOPY N/A 11/11/2017   Procedure: ESOPHAGOGASTRODUODENOSCOPY (EGD);  Surgeon: Corbin Ade, MD; mild Schatzki ring s/p dilation and mild erosive gastropathy.   ESOPHAGOGASTRODUODENOSCOPY (EGD) WITH ESOPHAGEAL DILATION N/A 01/25/2013   Dr. Jena Gauss- schatzki's ring- 54 F dilation. small hiatal hernia   HYSTEROSCOPY WITH D & C N/A 03/06/2019   Procedure: DILATATION AND CURETTAGE /HYSTEROSCOPY;  Surgeon: Tilda Burrow, MD;  Location: [facility];  Service: Gynecology;  Laterality: N/A;   MALONEY DILATION N/A 11/11/2017   Procedure: Elease Hashimoto DILATION;  Surgeon: Corbin Ade, MD;  Location: [facility];  Service: Endoscopy;  Laterality: N/A;   POLYPECTOMY N/A 03/06/2019   Procedure: POLYPECTOMY ( REMOVAL OF ENDOMETRIAL POLYP);  Surgeon: Tilda Burrow, MD;  Location: [facility];  Service: Gynecology;  Laterality: N/A;   POLYPECTOMY  04/15/2021   Procedure: POLYPECTOMY;  Surgeon: Corbin Ade, MD;  Location:  [facility];  Service: Endoscopy;;   TUBAL LIGATION      Short Social History:  Social History   Tobacco Use   Smoking status: Former    Current packs/day: 0.00    Average packs/day: 0.3 packs/day for 35.0 years (8.8 ttl pk-yrs)    Types: Cigarettes    Start date: 12/31/1977    Quit date: 12/31/2012    Years since quitting: 10.4   Smokeless tobacco: Never  Substance Use Topics   Alcohol use: Not Currently    Allergies  Allergen Reactions   Ciprofloxacin Shortness Of Breath   Latex Other (See Comments)    Burns skin    Vitamin C [Bioflavonoid Products] Itching    Current Outpatient Medications  Medication Sig Dispense Refill   acetaminophen (TYLENOL) 500 MG tablet Take 500 mg by mouth every 6 (six) hours as needed for moderate pain or headache.     alendronate (FOSAMAX) 70 MG tablet TAKE 1 TABLET(70 MG) BY MOUTH EVERY 7 DAYS WITH A FULL GLASS OF WATER AND ON AN EMPTY STOMACH 4 tablet 11   ALPRAZolam (XANAX) 0.5 MG tablet TAKE 1 TABLET(0.5 MG) BY MOUTH TWICE DAILY AS NEEDED FOR ANXIETY 180 tablet 0   amLODipine (NORVASC) 10 MG tablet Take 10 mg by mouth daily.     aspirin EC 81 MG tablet Take 81 mg by mouth at bedtime.      Calcium 500 MG tablet Take 500 mg by mouth in the morning and at bedtime.     carvedilol (COREG) 3.125 MG tablet TAKE 1 TABLET(3.125 MG) BY MOUTH TWICE DAILY 180 tablet 0   cholecalciferol (VITAMIN D3) 25 MCG (1000 UT) tablet Take 2,000 Units by mouth daily.     cilostazol (PLETAL) 50 MG tablet Take 1 tablet (50 mg total) by mouth 2 (two) times daily. 60 tablet 11   Coenzyme Q10 (COQ10) 100 MG CAPS Take 100 mg by mouth daily.     CRANBERRY PO Take 1 each by mouth 2 (two) times a week. Chewables     Cyanocobalamin (B-12) 2500 MCG TABS Take 2,500 mcg by mouth daily.     dexlansoprazole (DEXILANT) 60 MG capsule Take 1 capsule (60 mg total) by mouth daily. 30 capsule 11   EPINEPHrine 0.3 mg/0.3 mL IJ SOAJ injection Inject 0.3 mg into the muscle as needed for anaphylaxis. 1 each 1   fluticasone (FLONASE) 50 MCG/ACT nasal spray Place 2 sprays into both nostrils daily.     FLUZONE HIGH-DOSE 0.5 ML injection Inject 0.5 mLs into the muscle.     hydroquinone 4 % cream Apply 1 application topically 2 (two) times daily as needed (dark spots).     hydrOXYzine (VISTARIL) 25 MG capsule TAKE 1 CAPSULE(25 MG) BY MOUTH EVERY 8 HOURS AS NEEDED 90 capsule 2   ipratropium (ATROVENT) 0.03 % nasal spray INSTILL 2 SPRAYS IN EACH NOSTRIL EVERY 12 HOURS 30 mL 12   loratadine (CLARITIN) 10 MG tablet Take 10 mg by  mouth daily as needed for allergies.     MAGNESIUM PO Take 330 mg by mouth daily.     nortriptyline (PAMELOR) 10 MG capsule Take 1 capsule (10 mg total) by mouth at bedtime. 90 capsule 3   Omega-3 Fatty Acids (FISH OIL) 1000 MG CAPS Take 1,000 mg by mouth 3 (three) times a week.     ondansetron (ZOFRAN) 4 MG tablet TAKE 1 TABLET(4 MG) BY MOUTH EVERY 8 HOURS AS NEEDED FOR NAUSEA OR VOMITING 30 tablet 0  Plecanatide (TRULANCE) 3 MG TABS Take 3 mg by mouth daily. 30 tablet 11   rosuvastatin (CRESTOR) 10 MG tablet TAKE 1 TABLET(10 MG) BY MOUTH DAILY 90 tablet 0   sodium chloride (OCEAN) 0.65 % SOLN nasal spray Place 1 spray into both nostrils as needed for congestion.     spironolactone (ALDACTONE) 25 MG tablet Take 1 tablet (25 mg total) by mouth daily. 90 tablet 1   triamcinolone cream (KENALOG) 0.1 % Apply 1 Application topically 2 (two) times daily. 30 g 0   valsartan (DIOVAN) 320 MG tablet TAKE 1 TABLET BY MOUTH EVERY DAY 90 tablet 1   vitamin E 400 UNIT capsule Take 400 Units by mouth 3 (three) times a week.     zinc gluconate 50 MG tablet Take 50 mg by mouth daily.     Current Facility-Administered Medications  Medication Dose Route Frequency Provider Last Rate Last Admin   aspirin chewable tablet 324 mg  324 mg Oral Once        nitroGLYCERIN (NITROSTAT) SL tablet 0.4 mg  0.4 mg Sublingual Once         Review of Systems  Constitutional:  Constitutional negative. HENT: HENT negative.  Eyes: Eyes negative.  Cardiovascular: Positive for claudication.  GI: Gastrointestinal negative.  Musculoskeletal: Positive for leg pain.  Skin: Skin negative.  Neurological: Neurological negative. Hematologic: Hematologic/lymphatic negative.  Psychiatric: Psychiatric negative.        Objective:  Objective   Vitals:   06/29/23 1205  BP: (!) 147/74  Pulse: 71  Resp: 20  Temp: 97.9 F (36.6 C)  SpO2: 98%  Weight: 160 lb 11.2 oz (72.9 kg)  Height: 5\' 6"  (1.676 m)   Body mass index is  25.94 kg/m.  Physical Exam HENT:     Head: Normocephalic.     Nose: Nose normal.  Eyes:     Pupils: Pupils are equal, round, and reactive to light.  Neck:     Vascular: Carotid bruit present.  Cardiovascular:     Pulses:          Femoral pulses are 0 on the right side and 1+ on the left side.      Dorsalis pedis pulses are 0 on the right side and 0 on the left side.       Posterior tibial pulses are 0 on the right side and 0 on the left side.     Heart sounds: Murmur heard.  Pulmonary:     Effort: Pulmonary effort is normal.  Abdominal:     General: Abdomen is flat.  Musculoskeletal:     Right lower leg: No edema.     Left lower leg: No edema.  Skin:    General: Skin is warm.     Capillary Refill: Capillary refill takes less than 2 seconds.  Neurological:     General: No focal deficit present.     Mental Status: She is alert.  Psychiatric:        Mood and Affect: Mood normal.        Thought Content: Thought content normal.        Judgment: Judgment normal.     Data: ABI Findings:  +---------+------------------+-----+----------+--------+  Right   Rt Pressure (mmHg)IndexWaveform  Comment   +---------+------------------+-----+----------+--------+  Brachial 127                                        +---------+------------------+-----+----------+--------+  PTA     55                0.43 monophasic          +---------+------------------+-----+----------+--------+  DP      49                0.39 monophasic          +---------+------------------+-----+----------+--------+  Great Toe59                0.46 Abnormal            +---------+------------------+-----+----------+--------+   +---------+------------------+-----+--------+-------+  Left    Lt Pressure (mmHg)IndexWaveformComment  +---------+------------------+-----+--------+-------+  Brachial 126                                      +---------+------------------+-----+--------+-------+  PTA     90                0.71 biphasic         +---------+------------------+-----+--------+-------+  DP      81                0.64 biphasic         +---------+------------------+-----+--------+-------+  Great Toe58                0.46 Abnormal         +---------+------------------+-----+--------+-------+   +-------+-----------+-----------+------------+------------+  ABI/TBIToday's ABIToday's TBIPrevious ABIPrevious TBI  +-------+-----------+-----------+------------+------------+  Right 0.43       0.46       0.62        0.49          +-------+-----------+-----------+------------+------------+  Left  0.71       0.46       0.81        0.55          +-------+-----------+-----------+------------+------------+  .   Summary:  Right: Resting right ankle-brachial index indicates severe right lower  extremity arterial disease. The right toe-brachial index is abnormal.   Left: Resting left ankle-brachial index indicates moderate left lower  extremity arterial disease. The left toe-brachial index is abnormal.   Bilateral ABIs appear decreased compared to prior study on 03/09/2023.  Left TBIs appear decreased compared to prior study on 03/09/2023.   Aorta:  +------+-------+----------+----------+--------+--------+-----+       AP (cm)Trans (cm)PSV (cm/s)WaveformThrombusShape  +------+-------+----------+----------+--------+--------+-----+  Distal45.00                                            +------+-------+----------+----------+--------+--------+-----+     +-----------+--------+-----+---------------+----------+--------+  RIGHT     PSV cm/sRatioStenosis       Waveform  Comments  +-----------+--------+-----+---------------+----------+--------+  CIA Prox   638          75-99% stenosis                    +-----------+--------+-----+---------------+----------+--------+   CIA Distal 127                         monophasic          +-----------+--------+-----+---------------+----------+--------+  EIA Mid    57                          monophasic          +-----------+--------+-----+---------------+----------+--------+  CFA Distal 78                          monophasic          +-----------+--------+-----+---------------+----------+--------+  DFA       82                          monophasic          +-----------+--------+-----+---------------+----------+--------+  SFA Prox   50                          monophasic          +-----------+--------+-----+---------------+----------+--------+  SFA Mid    87                          monophasic          +-----------+--------+-----+---------------+----------+--------+  SFA Distal 284          50-74% stenosismonophasic          +-----------+--------+-----+---------------+----------+--------+  POP Prox   37                          monophasic          +-----------+--------+-----+---------------+----------+--------+  POP Distal 47                          monophasic          +-----------+--------+-----+---------------+----------+--------+  ATA Distal 20                          monophasic          +-----------+--------+-----+---------------+----------+--------+  PTA Distal 24                          monophasic          +-----------+--------+-----+---------------+----------+--------+  PERO Distal24                          monophasic          +-----------+--------+-----+---------------+----------+--------+     Summary:  Right:   - High grade stenosis (75-99%) of the right common iliac artery versus  focal occlusion with immediate reconstitution.  - 50-74% stenosis of the right distal superficial femoral artery.  - Monophasic waveforms observed throughout right lower extremity.   - Distal aorta appears patent with normal velocities.          Assessment/Plan:     68 year old female with known carotid artery stenosis followed every 6 months now here for reevaluation of right lower extremity claudication now demonstrating rest pain with severely depressed ABI and multilevel occlusive disease beginning at the common iliac artery on the right and also in the right SFA.  We discussed beginning with angiography from the left common femoral approach where she does have a palpable pulse possibly will need bilateral common femoral approach.  We also discussed that she may not be a candidate for endovascular intervention and may require CT with consideration of more invasive procedures including aortobifemoral bypass.  She may also require stenting of the right SFA given that she currently has rest pain and may also require bypass.  She demonstrates good understanding of all this we will schedule her  for aortogram from the left common femoral approach on Monday in the near future.  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 The patient is on best medical therapy for peripheral arterial disease. The patient has been counseled about the risks of tobacco use in atherosclerotic disease. The patient has been counseled to abstain from any tobacco use. An aortogram with bilateral lower extremity runoff angiography and Right lower extremity intervention and is indicated to better evaluate the patient's lower extremity circulation because of the limb threatening nature of the patient's diagnosis. Based on the patient's clinical exam and non-invasive data, we anticipate an endovascular intervention in the terminal aortic, iliac, and femoropopliteal vessels. Stenting and/or athrectomy would be favored because of the improved primary patency of these interventions as compared to plain balloon angioplasty.     Maeola Harman MD [facility]

## 2023-06-30 ENCOUNTER — Telehealth: Payer: Self-pay

## 2023-06-30 ENCOUNTER — Other Ambulatory Visit: Payer: Self-pay

## 2023-06-30 DIAGNOSIS — I70221 Atherosclerosis of native arteries of extremities with rest pain, right leg: Secondary | ICD-10-CM

## 2023-06-30 NOTE — Telephone Encounter (Signed)
Medication/Advice: -pt called requesting pain medicine for her rest pain in her leg -reviewed chart, pt was in the office yesterday to see Dr. Randie Heinz who is recommending an aortogram with bilateral lower extremity runoff with possible intervention which she plans on scheduling.   -explained that pain medications are typically prescribed after a surgical procedure.  She asked about wearing the compression hose and reviewed applying compression hose in the morning before exiting the bed and remove when going to bed.

## 2023-07-06 ENCOUNTER — Other Ambulatory Visit: Payer: Self-pay | Admitting: Family Medicine

## 2023-07-11 ENCOUNTER — Encounter (HOSPITAL_COMMUNITY)
Admission: AD | Disposition: A | Discharge status: Home / Self Care | Payer: Self-pay | Source: Home / Self Care | Attending: Vascular Surgery

## 2023-07-11 ENCOUNTER — Other Ambulatory Visit: Payer: Self-pay

## 2023-07-11 ENCOUNTER — Inpatient Hospital Stay (HOSPITAL_COMMUNITY)
Admission: AD | Admit: 2023-07-11 | Discharge: 2023-07-13 | DRG: 252 | Disposition: A | Discharge status: Home / Self Care | Payer: Medicare Other | Attending: Vascular Surgery | Admitting: Vascular Surgery

## 2023-07-11 DIAGNOSIS — I700 Atherosclerosis of aorta: Secondary | ICD-10-CM | POA: Diagnosis not present

## 2023-07-11 DIAGNOSIS — R29701 NIHSS score 1: Secondary | ICD-10-CM | POA: Diagnosis not present

## 2023-07-11 DIAGNOSIS — I97811 Intraoperative cerebrovascular infarction during other surgery: Secondary | ICD-10-CM | POA: Diagnosis not present

## 2023-07-11 DIAGNOSIS — I70221 Atherosclerosis of native arteries of extremities with rest pain, right leg: Principal | ICD-10-CM | POA: Diagnosis present

## 2023-07-11 DIAGNOSIS — I6501 Occlusion and stenosis of right vertebral artery: Secondary | ICD-10-CM | POA: Diagnosis not present

## 2023-07-11 DIAGNOSIS — G459 Transient cerebral ischemic attack, unspecified: Secondary | ICD-10-CM | POA: Diagnosis not present

## 2023-07-11 DIAGNOSIS — I2510 Atherosclerotic heart disease of native coronary artery without angina pectoris: Secondary | ICD-10-CM | POA: Diagnosis present

## 2023-07-11 DIAGNOSIS — Z833 Family history of diabetes mellitus: Secondary | ICD-10-CM | POA: Diagnosis not present

## 2023-07-11 DIAGNOSIS — I739 Peripheral vascular disease, unspecified: Secondary | ICD-10-CM | POA: Diagnosis not present

## 2023-07-11 DIAGNOSIS — E785 Hyperlipidemia, unspecified: Secondary | ICD-10-CM | POA: Diagnosis not present

## 2023-07-11 DIAGNOSIS — I6782 Cerebral ischemia: Secondary | ICD-10-CM | POA: Diagnosis not present

## 2023-07-11 DIAGNOSIS — I63431 Cerebral infarction due to embolism of right posterior cerebral artery: Secondary | ICD-10-CM | POA: Diagnosis not present

## 2023-07-11 DIAGNOSIS — I6522 Occlusion and stenosis of left carotid artery: Secondary | ICD-10-CM | POA: Diagnosis present

## 2023-07-11 DIAGNOSIS — Z860100 Personal history of colon polyps, unspecified: Secondary | ICD-10-CM

## 2023-07-11 DIAGNOSIS — R29702 NIHSS score 2: Secondary | ICD-10-CM | POA: Diagnosis present

## 2023-07-11 DIAGNOSIS — I63531 Cerebral infarction due to unspecified occlusion or stenosis of right posterior cerebral artery: Secondary | ICD-10-CM | POA: Diagnosis not present

## 2023-07-11 DIAGNOSIS — Z87891 Personal history of nicotine dependence: Secondary | ICD-10-CM

## 2023-07-11 DIAGNOSIS — Z8249 Family history of ischemic heart disease and other diseases of the circulatory system: Secondary | ICD-10-CM | POA: Diagnosis not present

## 2023-07-11 DIAGNOSIS — I10 Essential (primary) hypertension: Secondary | ICD-10-CM | POA: Diagnosis not present

## 2023-07-11 DIAGNOSIS — M810 Age-related osteoporosis without current pathological fracture: Secondary | ICD-10-CM | POA: Diagnosis present

## 2023-07-11 DIAGNOSIS — Z823 Family history of stroke: Secondary | ICD-10-CM | POA: Diagnosis not present

## 2023-07-11 DIAGNOSIS — I745 Embolism and thrombosis of iliac artery: Secondary | ICD-10-CM | POA: Diagnosis not present

## 2023-07-11 DIAGNOSIS — I6389 Other cerebral infarction: Secondary | ICD-10-CM | POA: Diagnosis not present

## 2023-07-11 DIAGNOSIS — I6521 Occlusion and stenosis of right carotid artery: Secondary | ICD-10-CM | POA: Diagnosis not present

## 2023-07-11 DIAGNOSIS — Z7983 Long term (current) use of bisphosphonates: Secondary | ICD-10-CM

## 2023-07-11 DIAGNOSIS — R0789 Other chest pain: Secondary | ICD-10-CM

## 2023-07-11 DIAGNOSIS — Z7902 Long term (current) use of antithrombotics/antiplatelets: Secondary | ICD-10-CM | POA: Diagnosis not present

## 2023-07-11 DIAGNOSIS — R002 Palpitations: Secondary | ICD-10-CM | POA: Diagnosis not present

## 2023-07-11 DIAGNOSIS — R29818 Other symptoms and signs involving the nervous system: Secondary | ICD-10-CM | POA: Diagnosis not present

## 2023-07-11 DIAGNOSIS — I493 Ventricular premature depolarization: Secondary | ICD-10-CM | POA: Diagnosis not present

## 2023-07-11 DIAGNOSIS — H53469 Homonymous bilateral field defects, unspecified side: Secondary | ICD-10-CM | POA: Diagnosis not present

## 2023-07-11 DIAGNOSIS — Z7982 Long term (current) use of aspirin: Secondary | ICD-10-CM | POA: Diagnosis not present

## 2023-07-11 HISTORY — PX: ABDOMINAL AORTOGRAM W/LOWER EXTREMITY: CATH118223

## 2023-07-11 HISTORY — PX: PERIPHERAL VASCULAR BALLOON ANGIOPLASTY: CATH118281

## 2023-07-11 HISTORY — PX: PERIPHERAL VASCULAR INTERVENTION: CATH118257

## 2023-07-11 LAB — CBC
HCT: 34.6 % — ABNORMAL LOW (ref 36.0–46.0)
Hemoglobin: 11.7 g/dL — ABNORMAL LOW (ref 12.0–15.0)
MCH: 30.8 pg (ref 26.0–34.0)
MCHC: 33.8 g/dL (ref 30.0–36.0)
MCV: 91.1 fL (ref 80.0–100.0)
Platelets: 276 10*3/uL (ref 150–400)
RBC: 3.8 MIL/uL — ABNORMAL LOW (ref 3.87–5.11)
RDW: 13 % (ref 11.5–15.5)
WBC: 10.7 10*3/uL — ABNORMAL HIGH (ref 4.0–10.5)
nRBC: 0 % (ref 0.0–0.2)

## 2023-07-11 LAB — POCT I-STAT, CHEM 8
BUN: 11 mg/dL (ref 8–23)
Calcium, Ion: 1.27 mmol/L (ref 1.15–1.40)
Chloride: 103 mmol/L (ref 98–111)
Creatinine, Ser: 1.2 mg/dL — ABNORMAL HIGH (ref 0.44–1.00)
Glucose, Bld: 92 mg/dL (ref 70–99)
HCT: 38 % (ref 36.0–46.0)
Hemoglobin: 12.9 g/dL (ref 12.0–15.0)
Potassium: 4.1 mmol/L (ref 3.5–5.1)
Sodium: 140 mmol/L (ref 135–145)
TCO2: 26 mmol/L (ref 22–32)

## 2023-07-11 LAB — GLUCOSE, CAPILLARY: Glucose-Capillary: 81 mg/dL (ref 70–99)

## 2023-07-11 LAB — HIV ANTIBODY (ROUTINE TESTING W REFLEX): HIV Screen 4th Generation wRfx: NONREACTIVE

## 2023-07-11 LAB — CREATININE, SERUM
Creatinine, Ser: 0.92 mg/dL (ref 0.44–1.00)
GFR, Estimated: >60 mL/min (ref >60)

## 2023-07-11 SURGERY — ABDOMINAL AORTOGRAM W/LOWER EXTREMITY
Anesthesia: LOCAL

## 2023-07-11 MED ORDER — IRBESARTAN 300 MG PO TABS
300.0000 mg | ORAL_TABLET | Freq: Every day | ORAL | Status: DC
  Filled 2023-07-11: qty 1

## 2023-07-11 MED ORDER — ONDANSETRON HCL 4 MG/2ML IJ SOLN: 4.0000 mg | Freq: Four times a day (QID) | INTRAMUSCULAR | Status: DC | PRN

## 2023-07-11 MED ORDER — SODIUM CHLORIDE 0.9 % WEIGHT BASED INFUSION
1.0000 mL/kg/h | INTRAVENOUS | Status: AC
  Administered 2023-07-11: 1 mL/kg/h via INTRAVENOUS

## 2023-07-11 MED ORDER — SODIUM CHLORIDE 0.9 % IV SOLN: 250.0000 mL | INTRAVENOUS | Status: AC | PRN

## 2023-07-11 MED ORDER — POTASSIUM CHLORIDE CRYS ER 20 MEQ PO TBCR
20.0000 meq | EXTENDED_RELEASE_TABLET | Freq: Once | ORAL | Status: DC
  Filled 2023-07-11: qty 2

## 2023-07-11 MED ORDER — GUAIFENESIN-DM 100-10 MG/5ML PO SYRP: 15.0000 mL | ORAL_SOLUTION | ORAL | Status: DC | PRN

## 2023-07-11 MED ORDER — PHENOL 1.4 % MT LIQD: 1.0000 | OROMUCOSAL | Status: DC | PRN

## 2023-07-11 MED ORDER — HEPARIN (PORCINE) IN NACL 1000-0.9 UT/500ML-% IV SOLN
INTRAVENOUS | Status: DC | PRN
  Administered 2023-07-11 (×2): 500 mL

## 2023-07-11 MED ORDER — CLOPIDOGREL BISULFATE 300 MG PO TABS
ORAL_TABLET | ORAL | Status: AC
  Filled 2023-07-11: qty 1

## 2023-07-11 MED ORDER — HYDRALAZINE HCL 20 MG/ML IJ SOLN: 5.0000 mg | INTRAMUSCULAR | Status: DC | PRN

## 2023-07-11 MED ORDER — SODIUM CHLORIDE 0.9% FLUSH: 3.0000 mL | INTRAVENOUS | Status: DC | PRN

## 2023-07-11 MED ORDER — LIDOCAINE HCL (PF) 1 % IJ SOLN
INTRAMUSCULAR | Status: DC | PRN
  Administered 2023-07-11 (×2): 5 mL via SUBCUTANEOUS

## 2023-07-11 MED ORDER — OXYCODONE HCL 5 MG PO TABS
5.0000 mg | ORAL_TABLET | ORAL | Status: DC | PRN
  Administered 2023-07-12 – 2023-07-13 (×3): 5 mg via ORAL
  Filled 2023-07-11 (×3): qty 1

## 2023-07-11 MED ORDER — CLOPIDOGREL BISULFATE 75 MG PO TABS: 75.0000 mg | ORAL_TABLET | Freq: Every day | ORAL | 11 refills | Status: DC

## 2023-07-11 MED ORDER — FLUTICASONE PROPIONATE 50 MCG/ACT NA SUSP
2.0000 | Freq: Every day | NASAL | Status: DC | PRN
  Administered 2023-07-12: 2 via NASAL
  Filled 2023-07-11: qty 16

## 2023-07-11 MED ORDER — CARVEDILOL 3.125 MG PO TABS
3.1250 mg | ORAL_TABLET | Freq: Two times a day (BID) | ORAL | Status: DC
  Administered 2023-07-12 – 2023-07-13 (×3): 3.125 mg via ORAL
  Filled 2023-07-11 (×3): qty 1

## 2023-07-11 MED ORDER — ASPIRIN 81 MG PO TBEC
81.0000 mg | DELAYED_RELEASE_TABLET | Freq: Every day | ORAL | Status: DC
  Administered 2023-07-11 – 2023-07-12 (×2): 81 mg via ORAL
  Filled 2023-07-11 (×2): qty 1

## 2023-07-11 MED ORDER — MORPHINE SULFATE (PF) 2 MG/ML IV SOLN: 2.0000 mg | INTRAVENOUS | Status: DC | PRN

## 2023-07-11 MED ORDER — HEPARIN SODIUM (PORCINE) 5000 UNIT/ML IJ SOLN
5000.0000 [IU] | Freq: Three times a day (TID) | INTRAMUSCULAR | Status: DC
  Administered 2023-07-12 – 2023-07-13 (×4): 5000 [IU] via SUBCUTANEOUS
  Filled 2023-07-11 (×4): qty 1

## 2023-07-11 MED ORDER — SPIRONOLACTONE 25 MG PO TABS
25.0000 mg | ORAL_TABLET | Freq: Every day | ORAL | Status: DC
Start: 2023-07-11 — End: 2023-07-13
  Administered 2023-07-11 – 2023-07-13 (×3): 25 mg via ORAL
  Filled 2023-07-11 (×3): qty 1

## 2023-07-11 MED ORDER — SODIUM CHLORIDE 0.9% FLUSH
3.0000 mL | Freq: Two times a day (BID) | INTRAVENOUS | Status: DC
Start: 2023-07-12 — End: 2023-07-13
  Administered 2023-07-12 (×2): 3 mL via INTRAVENOUS

## 2023-07-11 MED ORDER — IPRATROPIUM BROMIDE 0.06 % NA SOLN
2.0000 | Freq: Two times a day (BID) | NASAL | Status: DC | PRN
  Filled 2023-07-11: qty 15

## 2023-07-11 MED ORDER — HYDROXYZINE PAMOATE 25 MG PO CAPS: 25.0000 mg | ORAL_CAPSULE | Freq: Three times a day (TID) | ORAL | Status: DC | PRN

## 2023-07-11 MED ORDER — ALPRAZOLAM 0.5 MG PO TABS: 0.5000 mg | ORAL_TABLET | Freq: Two times a day (BID) | ORAL | Status: DC | PRN

## 2023-07-11 MED ORDER — POLYETHYLENE GLYCOL 3350 17 G PO PACK: 17.0000 g | PACK | Freq: Every day | ORAL | Status: DC | PRN

## 2023-07-11 MED ORDER — HEPARIN SODIUM (PORCINE) 1000 UNIT/ML IJ SOLN
INTRAMUSCULAR | Status: AC
  Filled 2023-07-11: qty 10

## 2023-07-11 MED ORDER — SODIUM CHLORIDE 0.9 % IV SOLN: INTRAVENOUS | Status: DC

## 2023-07-11 MED ORDER — ROSUVASTATIN CALCIUM 5 MG PO TABS
10.0000 mg | ORAL_TABLET | Freq: Every day | ORAL | Status: DC
  Administered 2023-07-11: 10 mg via ORAL
  Filled 2023-07-11 (×2): qty 2

## 2023-07-11 MED ORDER — METOPROLOL TARTRATE 5 MG/5ML IV SOLN: 2.0000 mg | INTRAVENOUS | Status: DC | PRN

## 2023-07-11 MED ORDER — PLECANATIDE 3 MG PO TABS: 3.0000 mg | ORAL_TABLET | Freq: Every day | ORAL | Status: DC

## 2023-07-11 MED ORDER — LIDOCAINE HCL (PF) 1 % IJ SOLN
INTRAMUSCULAR | Status: AC
  Filled 2023-07-11: qty 30

## 2023-07-11 MED ORDER — NITROGLYCERIN 0.4 MG SL SUBL: 0.4000 mg | SUBLINGUAL_TABLET | Freq: Once | SUBLINGUAL | Status: DC

## 2023-07-11 MED ORDER — FENTANYL CITRATE (PF) 100 MCG/2ML IJ SOLN
INTRAMUSCULAR | Status: DC | PRN
  Administered 2023-07-11: 50 ug via INTRAVENOUS

## 2023-07-11 MED ORDER — ONDANSETRON HCL 4 MG PO TABS: 4.0000 mg | ORAL_TABLET | Freq: Three times a day (TID) | ORAL | Status: DC | PRN

## 2023-07-11 MED ORDER — AMLODIPINE BESYLATE 10 MG PO TABS
10.0000 mg | ORAL_TABLET | Freq: Every day | ORAL | Status: DC
  Filled 2023-07-11: qty 1

## 2023-07-11 MED ORDER — LORATADINE 10 MG PO TABS: 10.0000 mg | ORAL_TABLET | Freq: Every day | ORAL | Status: DC | PRN

## 2023-07-11 MED ORDER — FENTANYL CITRATE (PF) 100 MCG/2ML IJ SOLN
INTRAMUSCULAR | Status: AC
  Filled 2023-07-11: qty 2

## 2023-07-11 MED ORDER — SODIUM CHLORIDE 0.9% FLUSH
3.0000 mL | Freq: Two times a day (BID) | INTRAVENOUS | Status: DC
  Administered 2023-07-11 – 2023-07-13 (×4): 3 mL via INTRAVENOUS

## 2023-07-11 MED ORDER — MIDAZOLAM HCL 2 MG/2ML IJ SOLN
INTRAMUSCULAR | Status: DC | PRN
  Administered 2023-07-11: 1 mg via INTRAVENOUS

## 2023-07-11 MED ORDER — BISACODYL 10 MG RE SUPP: 10.0000 mg | Freq: Every day | RECTAL | Status: DC | PRN

## 2023-07-11 MED ORDER — LABETALOL HCL 5 MG/ML IV SOLN: 10.0000 mg | INTRAVENOUS | Status: DC | PRN

## 2023-07-11 MED ORDER — HYDROMORPHONE HCL 1 MG/ML IJ SOLN: 0.5000 mg | INTRAMUSCULAR | Status: DC | PRN

## 2023-07-11 MED ORDER — ACETAMINOPHEN 325 MG PO TABS
650.0000 mg | ORAL_TABLET | ORAL | Status: DC | PRN
  Administered 2023-07-11 – 2023-07-13 (×2): 650 mg via ORAL
  Filled 2023-07-11 (×2): qty 2

## 2023-07-11 MED ORDER — PANTOPRAZOLE SODIUM 40 MG PO TBEC
40.0000 mg | DELAYED_RELEASE_TABLET | Freq: Every day | ORAL | Status: DC
  Administered 2023-07-11 – 2023-07-13 (×3): 40 mg via ORAL
  Filled 2023-07-11 (×4): qty 1

## 2023-07-11 MED ORDER — MIDAZOLAM HCL 2 MG/2ML IJ SOLN
INTRAMUSCULAR | Status: AC
Start: 2023-07-11 — End: ?
  Filled 2023-07-11: qty 2

## 2023-07-11 MED ORDER — HEPARIN SODIUM (PORCINE) 1000 UNIT/ML IJ SOLN
INTRAMUSCULAR | Status: DC | PRN
  Administered 2023-07-11: 5000 [IU] via INTRAVENOUS

## 2023-07-11 MED ORDER — IODIXANOL 320 MG/ML IV SOLN
INTRAVENOUS | Status: DC | PRN
  Administered 2023-07-11: 70 mL via INTRA_ARTERIAL

## 2023-07-11 MED ORDER — ALUM & MAG HYDROXIDE-SIMETH 200-200-20 MG/5ML PO SUSP: 15.0000 mL | ORAL | Status: DC | PRN

## 2023-07-11 SURGICAL SUPPLY — 20 items
BALLN MUSTANG 7X60X75 (BALLOONS) ×3 IMPLANT
BALLOON MUSTANG 7X60X75 (BALLOONS) IMPLANT
CATH BEACON 5 .035 65 KMP TIP (CATHETERS) IMPLANT
CATH OMNI FLUSH 5F 65CM (CATHETERS) IMPLANT
CLOSURE MYNX CONTROL 6F/7F (Vascular Products) IMPLANT
COVER DOME SNAP 22 D (MISCELLANEOUS) IMPLANT
GLIDEWIRE ADV .035X260CM (WIRE) IMPLANT
KIT ENCORE 26 ADVANTAGE (KITS) IMPLANT
KIT MICROPUNCTURE NIT STIFF (SHEATH) IMPLANT
SET ATX-X65L (MISCELLANEOUS) IMPLANT
SHEATH BRITE TIP 6FR 35CM (SHEATH) IMPLANT
SHEATH PINNACLE 5F 10CM (SHEATH) IMPLANT
SHEATH PINNACLE 6F 10CM (SHEATH) IMPLANT
SHEATH PROBE COVER 6X72 (BAG) IMPLANT
STENT ELUVIA 7X60X130 (Permanent Stent) IMPLANT
STENT VIABAHN 7X39 6FR 80 (Permanent Stent) IMPLANT
TRAY PV CATH (CUSTOM PROCEDURE TRAY) ×3 IMPLANT
WIRE BENTSON .035X145CM (WIRE) IMPLANT
WIRE ROSEN-J .035X180CM (WIRE) IMPLANT
WIRE TORQFLEX AUST .018X40CM (WIRE) IMPLANT

## 2023-07-11 NOTE — Progress Notes (Signed)
 Stroke Dr. Otelia Limes and stroke team to see patient. Neuro assessment complete. MRI will be ordered as a routine assessment. Symptoms may be an effect of sedation. VS HR 82 BP 142/68 99% on room air. Will continue to monitor.Mamie Levers

## 2023-07-11 NOTE — Interval H&P Note (Signed)
 History and Physical Interval Note:  07/11/2023 12:25 PM  [name]  has presented today for surgery, with the diagnosis of atherosclerosis rt lower extremity rest pain [id number].  The various methods of treatment have been discussed with the patient and family. After consideration of risks, benefits and other options for treatment, the patient has consented to  Procedure(s): ABDOMINAL AORTOGRAM W/LOWER EXTREMITY (N/A) as a surgical intervention.  The patient's history has been reviewed, patient examined, no change in status, stable for surgery.  I have reviewed the patient's chart and labs.  Questions were answered to the patient's satisfaction.     Lemar Livings

## 2023-07-11 NOTE — Progress Notes (Signed)
 Patient resting quietly in her bed with daughter at bedside. VSS. Bilateral groin dressing are dry/intact. No bleeding or hematoma present. Neuro assessment at this time. No changes from previous assessment. Report called to 4E for patient transfer. Care will be released.Mamie Levers

## 2023-07-11 NOTE — Consult Note (Incomplete)
 NEUROLOGY CONSULT NOTE   Date of service: July 11, 2023 Patient Name: [name] MRN:  [id number] DOB:  17-Aug-1955 Chief Complaint: Acute onset of blurred vision Requesting Provider: Juventino Slovak*  History of Present Illness  [name] is a 68 y.o. female with a PMHx of bilateral carotid stenosis, cataract, chronic left shoulder pain, anxiety, headache, HLD, HTN, osteoporosis, prediabetes, PVD and tremor who underwent percutaneous ultrasound-guided access and Mynx device closure of his bilateral common femoral arteries with stent of bilateral common iliac arteries and stent of left external iliac artery today. She was under conscious sedation for the procedure and when she subsequently interactive with staff at 1550, she complained of bilateral visual blurring ***   Past Medical History:  Diagnosis Date   Anxiety    Carotid stenosis, asymptomatic, bilateral    Cataract    Chronic left shoulder pain    Headache    Hiatal hernia    small   Hyperlipidemia    Hypertension    Osteoporosis    Pre-diabetes    Schatzki's ring    Seasonal allergies    Tremor      LKW: *** Modified rankin score: {Modified Rankin Scale:21264} IV Thrombolysis: ***Yes, *** No (reason) EVT: ***Yes, *** No (reason) ICH Score:***  NIHSS components Score: Comment  1a Level of Conscious 0[x]  1[]  2[]  3[]      1b LOC Questions 0[x]  1[]  2[]       1c LOC Commands 0[x]  1[]  2[]       2 Best Gaze 0[x]  1[]  2[]       3 Visual 0[x]  1[]  2[]  3[]      4 Facial Palsy 0[x]  1[]  2[]  3[]      5a Motor Arm - left 0[x]  1[]  2[]  3[]  4[]  UN[]    5b Motor Arm - Right 0[x]  1[]  2[]  3[]  4[]  UN[]    6a Motor Leg - Left 0[x]  1[]  2[]  3[]  4[]  UN[]    6b Motor Leg - Right 0[x]  1[]  2[]  3[]  4[]  UN[]    7 Limb Ataxia 0[x]  1[]  2[]  3[]  UN[]     8 Sensory 0[]  1[x]  2[]  UN[]      9 Best Language 0[x]  1[]  2[]  3[]      10 Dysarthria 0[x]  1[]  2[]  UN[]      11 Extinct. and Inattention 0[x]  1[]  2[]       TOTAL:   1      ROS   ***Comprehensive ROS performed and pertinent positives documented in HPI  ***Unable to ascertain due to ***  Past History   Past Medical History:  Diagnosis Date   Anxiety    Carotid stenosis, asymptomatic, bilateral    Cataract    Chronic left shoulder pain    Headache    Hiatal hernia    small   Hyperlipidemia    Hypertension    Osteoporosis    Pre-diabetes    Schatzki's ring    Seasonal allergies    Tremor     Past Surgical History:  Procedure Laterality Date   CARDIAC CATHETERIZATION  2005   "Ms. Benbrook has essentially normal coronary arteries and normal left ventricular function.  She will be treated empirically with anti-reflux measures"   CATARACT EXTRACTION, BILATERAL     CHOLECYSTECTOMY     COLONOSCOPY    06/16/2004   WUJ:WJXBJY rectum/Diminutive polyps of the splenic flexure and the cecum/The remainder of the colonic mucosa appeared normal pathology (hyperplastic polyps).   COLONOSCOPY N/A 08/07/2015   Dr. Jena Gauss: 6 mm descending colon tubular adenoma, multiple medium-mouthed diverticula in  sigmoid colon. surveillance 2022   COLONOSCOPY N/A 04/15/2021   Procedure: COLONOSCOPY;  Surgeon: Corbin Ade, MD;  [facility];  Service: Endoscopy;  Laterality: N/A;  9:30am   ESOPHAGOGASTRODUODENOSCOPY  09/11/2001   UEA:VWUJWJXBJ ring otherwise normal esophagus/Normal stomach/Normal D1 and D2 status post passage of a 56 French Maloney dilator   ESOPHAGOGASTRODUODENOSCOPY N/A 11/11/2017   Procedure: ESOPHAGOGASTRODUODENOSCOPY (EGD);  Surgeon: Corbin Ade, MD; mild Schatzki ring s/p dilation and mild erosive gastropathy.   ESOPHAGOGASTRODUODENOSCOPY (EGD) WITH ESOPHAGEAL DILATION N/A 01/25/2013   Dr. Jena Gauss- schatzki's ring- 54 F dilation. small hiatal hernia   HYSTEROSCOPY WITH D & C N/A 03/06/2019   Procedure: DILATATION AND CURETTAGE /HYSTEROSCOPY;  Surgeon: Tilda Burrow, MD;  Location: [facility];  Service: Gynecology;  Laterality: N/A;   MALONEY DILATION  N/A 11/11/2017   Procedure: Elease Hashimoto DILATION;  Surgeon: Corbin Ade, MD;  Location: [facility];  Service: Endoscopy;  Laterality: N/A;   POLYPECTOMY N/A 03/06/2019   Procedure: POLYPECTOMY ( REMOVAL OF ENDOMETRIAL POLYP);  Surgeon: Tilda Burrow, MD;  Location: [facility];  Service: Gynecology;  Laterality: N/A;   POLYPECTOMY  04/15/2021   Procedure: POLYPECTOMY;  Surgeon: Corbin Ade, MD;  [facility];  Service: Endoscopy;;   TUBAL LIGATION      Family History: Family History  Problem Relation Age of Onset   Diabetes Other    Diabetes Mother    Heart disease Mother    Heart disease Father    Diabetes Sister    Diabetes Brother    Hypertension Brother    Diabetes Daughter    Hypertension Maternal Grandmother    Stroke Maternal Grandfather    Early death Paternal Grandfather    Colon cancer Neg Hx    Liver disease Neg Hx     Social History  reports that she quit smoking about 10 years ago. Her smoking use included cigarettes. She started smoking about 45 years ago. She has a 8.8 pack-year smoking history. She has never used smokeless tobacco. She reports that she does not currently use alcohol. She reports that she does not use drugs.  Allergies  Allergen Reactions   Ciprofloxacin Shortness Of Breath   Latex Other (See Comments)    Burns skin   Vitamin C [Bioflavonoid Products] Itching    Medications   Current Facility-Administered Medications:    0.9 %  sodium chloride infusion, , Intravenous, Continuous, Maeola Harman, MD, Last Rate: 100 mL/hr at 07/11/23 1215, New Bag at 07/11/23 1215   [START ON 07/12/2023] 0.9 %  sodium chloride infusion, 250 mL, Intravenous, PRN, Maeola Harman, MD   0.9% sodium chloride infusion, 1 mL/kg/hr, Intravenous, Continuous, Maeola Harman, MD, Last Rate: 70.8 mL/hr at 07/11/23 1635, 1 mL/kg/hr at 07/11/23 1635   acetaminophen (TYLENOL) tablet 650 mg, 650 mg, Oral, Q4H PRN, Maeola Harman, MD   fentaNYL (SUBLIMAZE) injection, , , PRN, Maeola Harman, MD, 50 mcg at 07/11/23 1453   Heparin (Porcine) in NaCl 1000-0.9 UT/500ML-% SOLN, , , PRN, Maeola Harman, MD, 500 mL at 07/11/23 1451   heparin sodium (porcine) injection, , , PRN, Maeola Harman, MD, 5,000 Units at 07/11/23 1515   hydrALAZINE (APRESOLINE) injection 5 mg, 5 mg, Intravenous, Q20 Min PRN, Maeola Harman, MD   iodixanol (VISIPAQUE) 320 MG/ML injection, , , PRN, Maeola Harman, MD, 70 mL at 07/11/23 1545   labetalol (NORMODYNE) injection 10 mg, 10 mg, Intravenous, Q10 min  PRN, Maeola Harman, MD   lidocaine (PF) (XYLOCAINE) 1 % injection, , , PRN, Maeola Harman, MD, 5 mL at Aug 05, 2023 1513   midazolam (VERSED) injection, , , PRN, Maeola Harman, MD, 1 mg at 08/05/2023 1453   morphine (PF) 2 MG/ML injection 2 mg, 2 mg, Intravenous, Q1H PRN, Maeola Harman, MD   ondansetron [facility]) injection 4 mg, 4 mg, Intravenous, Q6H PRN, Maeola Harman, MD   oxyCODONE (Oxy IR/ROXICODONE) immediate release tablet 5-10 mg, 5-10 mg, Oral, Q4H PRN, Maeola Harman, MD   [START ON 07/12/2023] sodium chloride flush (NS) 0.9 % injection 3 mL, 3 mL, Intravenous, Q12H, Maeola Harman, MD   [START ON 07/12/2023] sodium chloride flush (NS) 0.9 % injection 3 mL, 3 mL, Intravenous, PRN, Maeola Harman, MD  Vitals   Vitals:   2023-08-05 1541 August 05, 2023 1546 08/05/23 1551 08-05-2023 1602  BP: (!) 152/66 (!) 152/69 (!) 152/69   Pulse: 72 73 73   Resp: (!) 24 15 16 17   Temp:      TempSrc:      SpO2: 100% 98%    Weight:      Height:        Body mass index is 25.18 kg/m.  Physical Exam   Physical Exam  HEENT-  [place]/AT    Lungs- Respirations unlabored Extremities- No edema  Neurological Examination Mental Status: Alert, oriented x 5, thought content appropriate.  Speech fluent without evidence of  aphasia.  Able to follow all commands without difficulty. Cranial Nerves: II: Visual fields intact to confrontation in all 4 quadrants of each eye. No extinction to DSS. PERRL No color desaturation. III,IV, VI: No ptosis. EOMI.  V: Temp sensation decreased to right face  VII: Smile symmetric VIII: Hearing intact to voice IX,X: No hoarseness XI: Symmetric shoulder shrug XII: Midline tongue extension Motor: BUE 5/5 proximally and distally BLE 5/5 proximally and distally  No pronator drift.  Sensory: Temp decreased to left leg, otherwise temp intact x 4. Light touch intact throughout, bilaterally with no extinction to DSS.  Deep Tendon Reflexes: 2+ and symmetric bilateral brachioradialis and biceps. Brisk patellar reflexes with low amplitude clonus bilaterally.  Cerebellar: No ataxia with FNF and H-S bilaterally  Gait: Deferred  Labs/Imaging/Neurodiagnostic studies   CBC:  Recent Labs  Lab Aug 05, 2023 1203  HGB 12.9  HCT 38.0   Basic Metabolic Panel:  Lab Results  Component Value Date   NA 140 08/05/2023   K 4.1 08-05-2023   CO2 24 05/02/2023   GLUCOSE 92 08/05/23   BUN 11 2023/08/05   CREATININE 1.20 (H) 08-05-2023   CALCIUM 9.6 05/02/2023   GFRNONAA >60 05/02/2023   GFRAA 74 06/10/2020   Lipid Panel:  Lab Results  Component Value Date   LDLCALC 94 06/11/2022   HgbA1c:  Lab Results  Component Value Date   HGBA1C 6.2 (H) 12/22/2022   Urine Drug Screen:     Component Value Date/Time   LABOPIA NONE DETECTED 09/15/2014 1648   COCAINSCRNUR NONE DETECTED 09/15/2014 1648   LABBENZ NONE DETECTED 09/15/2014 1648   AMPHETMU NONE DETECTED 09/15/2014 1648   THCU NONE DETECTED 09/15/2014 1648   LABBARB NONE DETECTED 09/15/2014 1648    Alcohol Level     Component Value Date/Time   ETH <5 09/15/2014 1637   INR  Lab Results  Component Value Date   INR 1.01 09/15/2014   APTT  Lab Results  Component Value Date   APTT 27 09/15/2014  ASSESSMENT   MARKI FREDE is a 68 y.o. female ***  RECOMMENDATIONS  *** ______________________________________________________________________    Dessa Phi, Arlyne Brandes, MD [facility]

## 2023-07-11 NOTE — Significant Event (Addendum)
 Rapid Response Event Note   Reason for Call :  Peripheral vision changes  Initial Focused Assessment:  LKW at 1420 and now complaining of left hemianopia. During procedure pt began having blurry vision. Onset around 1450. Pt endorses blurred vision improved, but is unsure if it fully resolved. At 1740, she go up to ambulate and had sudden changes in her peripheral vision on her left eye. Pt also found to be orthostatic at that time. BP 123/84 lying and 96/69 sitting.   Upon initial evaluation, pt noted to have complete left hemianopia in her left eye and a partial left hemianopia in her right eye.   Upon Dr. Shelbie Hutching evaluation, pt visual changes had resolved.    Interventions:  -Code stroke  Plan of Care:  -MRI per Dr. Otelia Limes  Event Summary:  MD Notified: Dr. Karin Lieu  Call Time: 1758 Arrival Time: 1800 End Time: 1830  Jennye Moccasin, RN

## 2023-07-11 NOTE — Op Note (Signed)
 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 Randie Heinz, MD Procedure Performed: 1.  Percutaneous ultrasound-guided access and Mynx device closure bilateral common femoral arteries 2.  Catheter in aorta and aortogram 3.  Bilateral lower extremity angiography 4.  Catheter selection right common femoral artery from left common femoral approach 5.  Stent of bilateral common iliac arteries with 7 x 39 mm VBX and stent of left external iliac artery with 7 x 60 mm Eluvia 6.  Moderate sedation with fentanyl and Versed for 51 minutes  Indications: 68 year old female with right lower extremity rest pain found to have multilevel occlusive disease.  She is now indicated for angiography with possible endovascular intervention.  Findings: The proximal aorta was patent with bilateral renal arteries patent.  The distal terminal aorta was heavily calcified there was a subtotal occlusion of the right common iliac artery with heavily diseased greater than 80% stenosis of the left common iliac artery extending down into the left external iliac artery.  The right lower extremity common femoral was patent the SFA is initially diseased and occludes for approximately 20 cm and reconstitutes above the knee with three-vessel runoff dominant via the ankle.  On the left side the common femoral artery is similarly nondiseased and there is a healthy appearing profunda and left SFA in the lower aspect of the left lower extremity was not evaluated.  After stenting the right common iliac artery there was 0% residual stenosis where previously was subtotally occluded and there is no gradient between the aorta and the external iliac artery with a patent hypogastric.  On the left side after stenting of the left common iliac artery we then  extended for persistent gradient of 50 mmHg down to the left external iliac artery with the noncovered drug-eluting stent postdilated with a 7 mm balloon and then there was no gradient from the aorta to the distal external iliac artery.  Patient will be reevaluated in 4 to 6 weeks in our office and if she has persistent right lower extremity symptoms would be considered for right common femoral to above-knee popliteal artery bypass with vein versus more likely PTFE.   Procedure:  The patient was identified in the holding area and taken to room 8.  The patient was then placed supine on the table and prepped and draped in the usual sterile fashion.  A time out was called.  Ultrasound was used to evaluate initially the left common femoral artery.  This was free of disease.  The area was anesthetized with 1% lidocaine and cannulated with a micropuncture needle followed by wire and a sheath.  An ultrasound image was saved the permanent record.  Concomitantly we administered fentanyl and Versed as moderate sedation and her vital signs were monitored throughout the case.  We placed a Bentson wire which would not fully track followed by 5 [place] sheath.  We perform retrograde angiography and then using Glidewire advantage and Kumpe catheter were able to track into the aorta and confirmed intraluminal access and an Omni catheter was placed to the level of L1 and aortogram was performed.  We then pulled back to the aortic bifurcation and perform dedicated pelvic angiography and angles.  With the above findings I elected to cross the bifurcation using Glidewire advantage placed catheter in the right common femoral artery and perform right lower extremity angiography.  The SFA did not appear amenable to endovascular intervention given the long segment occlusion with very diminutive artery proximally.  I elected then to retract the catheter in place a Rosen wire into the aorta from the left common femoral artery.  On the  right side ultrasound was then used to identify the right common femoral artery which was also free of disease and this area was anesthetized with 1% lidocaine and the right common femoral artery was then cannulated with a micropuncture needle followed by wire and a sheath.  An ultrasound image again was saved the permanent record.  We placed the Glidewire advantage on this side and using a Kumpe catheter were able to track through the aorta and confirmed intraluminal access.  Long 6 French sheath were then placed from both sides into the aorta and aortogram was performed which identified the bilateral common iliac artery occlusive disease.  We then deployed two 7 x 39 mm VBX's to nominal pressure.  I completion we performed gradients on the right side there was no gradient on the left side there was a 50 mmHg gradient.  Dedicated RAO angiography was then performed and we extended down into the external iliac artery with a 7 x 60 mm drug-eluting stent postdilated with 7 mm balloon.  At completion there was no residual stenosis identified angiographically and there was no further gradient.  Satisfied with this we exchanged for short 6 French sheath bilaterally and deployed minx devices.  The patient tolerated the procedure without any complication.   Contrast: 70cc  Ralf Konopka C. Randie Heinz, MD [facility] [place] Office: [phone] Pager: [phone]

## 2023-07-11 NOTE — Progress Notes (Signed)
 Bilateral groin dressings dry/intact with no bleeding or hematoma present. Assisted patient out of bed for ambulation. Patient c/o dizziness upon standing as well as visual disturbances in left eye. Patient states that when looking straight, she can only see out her right peripheral view but not the left. SBP 127 which is down from 143/65. Patient became unstable on feet and felt as if she was going to "pass out." Assisted back in bed per nurse and daughter. SBP 96. CBG 81. Dr. Randie Heinz notified; will notify Dr. Karin Lieu to see patient.Mamie Levers

## 2023-07-12 ENCOUNTER — Inpatient Hospital Stay (HOSPITAL_COMMUNITY)

## 2023-07-12 ENCOUNTER — Encounter (HOSPITAL_COMMUNITY): Payer: Self-pay | Admitting: Vascular Surgery

## 2023-07-12 ENCOUNTER — Ambulatory Visit: Payer: Medicare Other | Admitting: Physician Assistant

## 2023-07-12 DIAGNOSIS — I739 Peripheral vascular disease, unspecified: Secondary | ICD-10-CM | POA: Diagnosis not present

## 2023-07-12 DIAGNOSIS — I6389 Other cerebral infarction: Secondary | ICD-10-CM | POA: Diagnosis not present

## 2023-07-12 DIAGNOSIS — I63431 Cerebral infarction due to embolism of right posterior cerebral artery: Secondary | ICD-10-CM | POA: Diagnosis not present

## 2023-07-12 LAB — COMPREHENSIVE METABOLIC PANEL
ALT: 10 U/L (ref 0–44)
AST: 16 U/L (ref 15–41)
Albumin: 3.6 g/dL (ref 3.5–5.0)
Alkaline Phosphatase: 34 U/L — ABNORMAL LOW (ref 38–126)
Anion gap: 7 (ref 5–15)
BUN: 11 mg/dL (ref 8–23)
CO2: 20 mmol/L — ABNORMAL LOW (ref 22–32)
Calcium: 8.7 mg/dL — ABNORMAL LOW (ref 8.9–10.3)
Chloride: 109 mmol/L (ref 98–111)
Creatinine, Ser: 0.95 mg/dL (ref 0.44–1.00)
GFR, Estimated: >60 mL/min (ref >60)
Glucose, Bld: 101 mg/dL — ABNORMAL HIGH (ref 70–99)
Potassium: 4 mmol/L (ref 3.5–5.1)
Sodium: 136 mmol/L (ref 135–145)
Total Bilirubin: 1 mg/dL (ref 0.0–1.2)
Total Protein: 6.2 g/dL — ABNORMAL LOW (ref 6.5–8.1)

## 2023-07-12 LAB — LIPID PANEL
Cholesterol: 146 mg/dL (ref 0–200)
HDL: 60 mg/dL (ref >40)
LDL Cholesterol: 75 mg/dL (ref 0–99)
Total CHOL/HDL Ratio: 2.4 ratio
Triglycerides: 56 mg/dL (ref <150)
VLDL: 11 mg/dL (ref 0–40)

## 2023-07-12 LAB — CBC
HCT: 34.3 % — ABNORMAL LOW (ref 36.0–46.0)
Hemoglobin: 11.1 g/dL — ABNORMAL LOW (ref 12.0–15.0)
MCH: 30.3 pg (ref 26.0–34.0)
MCHC: 32.4 g/dL (ref 30.0–36.0)
MCV: 93.7 fL (ref 80.0–100.0)
Platelets: 272 10*3/uL (ref 150–400)
RBC: 3.66 MIL/uL — ABNORMAL LOW (ref 3.87–5.11)
RDW: 13.1 % (ref 11.5–15.5)
WBC: 8.5 10*3/uL (ref 4.0–10.5)
nRBC: 0 % (ref 0.0–0.2)

## 2023-07-12 LAB — ECHOCARDIOGRAM COMPLETE
Area-P 1/2: 4.26 cm2
Calc EF: 57.8 %
Height: 67 in
S' Lateral: 2.9 cm
Single Plane A2C EF: 46.1 %
Single Plane A4C EF: 68.2 %
Weight: 2505.6 [oz_av]

## 2023-07-12 LAB — HEMOGLOBIN A1C
Hgb A1c MFr Bld: 6 % — ABNORMAL HIGH (ref 4.8–5.6)
Mean Plasma Glucose: 125.5 mg/dL

## 2023-07-12 MED ORDER — CLOPIDOGREL BISULFATE 75 MG PO TABS
75.0000 mg | ORAL_TABLET | Freq: Every day | ORAL | Status: DC
  Administered 2023-07-12 – 2023-07-13 (×2): 75 mg via ORAL
  Filled 2023-07-12 (×2): qty 1

## 2023-07-12 MED ORDER — IOHEXOL 350 MG/ML SOLN
75.0000 mL | Freq: Once | INTRAVENOUS | Status: AC | PRN
  Administered 2023-07-12: 75 mL via INTRAVENOUS

## 2023-07-12 MED ORDER — ROSUVASTATIN CALCIUM 20 MG PO TABS
20.0000 mg | ORAL_TABLET | Freq: Every day | ORAL | Status: DC
  Administered 2023-07-12 – 2023-07-13 (×2): 20 mg via ORAL
  Filled 2023-07-12 (×2): qty 1

## 2023-07-12 NOTE — Plan of Care (Signed)
  Problem: Activity: Goal: Ability to return to baseline activity level will improve Outcome: Progressing   

## 2023-07-12 NOTE — Progress Notes (Signed)
 PHARMACIST LIPID MONITORING   [name] is a 68 y.o. female admitted on 07/11/2023 with Peripheral vascular disease.  Pharmacy has been consulted to optimize lipid-lowering therapy with the indication of secondary prevention for clinical ASCVD.  Recent Labs:  Lipid Panel (last 6 months):   Lab Results  Component Value Date   CHOL 146 07/12/2023   TRIG 56 07/12/2023   HDL 60 07/12/2023   CHOLHDL 2.4 07/12/2023   VLDL 11 07/12/2023   LDLCALC 75 07/12/2023    Hepatic function panel (last 6 months):   Lab Results  Component Value Date   AST 16 07/12/2023   ALT 10 07/12/2023   ALKPHOS 34 (L) 07/12/2023   BILITOT 1.0 07/12/2023   BILIDIR <0.1 05/02/2023   IBILI NOT CALCULATED 05/02/2023    SCr (since admission):   Serum creatinine: 0.95 mg/dL 40/98/11 9147 Estimated creatinine clearance: 54.4 mL/min  Current therapy and lipid therapy tolerance Current lipid-lowering therapy: Rosuvastatin 20 mg Previous lipid-lowering therapies (if applicable): na Documented or reported allergies or intolerances to lipid-lowering therapies (if applicable): none  Assessment:   MD increase Rosuvastatin from 10 mg to 20 mg daily.  Plan:    1.Statin intensity (high intensity recommended for all patients regardless of the LDL):  No statin changes. The patient is already on a high intensity statin.  2.Add ezetimibe (if any one of the following):   Not indicated at this time.  3.Refer to lipid clinic:   No  4.Follow-up with:  Primary care provider - [name], MD  5.Follow-up labs after discharge:  F/u labs in 8 - 12 weeks     Jeanella Cara, PharmD, [facility] Clinical Pharmacist Please see AMION for all Pharmacists' Contact Phone Numbers 07/12/2023, 8:34 AM

## 2023-07-12 NOTE — Progress Notes (Addendum)
  Progress Note    07/12/2023 8:31 AM 1 Day Post-Op  Subjective:  right temporal headache, shooting pains intermittently. Blurred vision bilaterally, still with loss of left peripheral vision   Vitals:   07/12/23 0600 07/12/23 0755  BP: 133/74 (!) 109/54  Pulse:  70  Resp: 15 14  Temp:  98 F (36.7 C)  SpO2:  99%   Physical Exam: Cardiac:  regular Lungs:  non labored Extremities:  bilateral femoral access sites soft without swelling or hematoma, BLE with doppler DP and PT signals bilaterally. Moving extremities without deficits Abdomen:  soft Neurologic: alert and oriented, speech coherent  CBC    Component Value Date/Time   WBC 8.5 07/12/2023 0505   RBC 3.66 (L) 07/12/2023 0505   HGB 11.1 (L) 07/12/2023 0505   HGB 12.8 11/16/2012 0000   HCT 34.3 (L) 07/12/2023 0505   HCT 38 11/16/2012 0000   PLT 272 07/12/2023 0505   MCV 93.7 07/12/2023 0505   MCV 86.2 11/16/2012 0000   MCH 30.3 07/12/2023 0505   MCHC 32.4 07/12/2023 0505   RDW 13.1 07/12/2023 0505   LYMPHSABS 3.0 11/09/2022 0338   MONOABS 0.5 11/09/2022 0338   EOSABS 0.3 11/09/2022 0338   BASOSABS 0.0 11/09/2022 0338    BMET    Component Value Date/Time   NA 136 07/12/2023 0505   NA 141 11/16/2012 0000   K 4.0 07/12/2023 0505   K 4.1 11/16/2012 0000   CL 109 07/12/2023 0505   CO2 20 (L) 07/12/2023 0505   GLUCOSE 101 (H) 07/12/2023 0505   BUN 11 07/12/2023 0505   BUN 11 11/16/2012 0000   CREATININE 0.95 07/12/2023 0505   CREATININE 1.09 (H) 12/22/2022 0822   CALCIUM 8.7 (L) 07/12/2023 0505   CALCIUM 9.7 11/16/2012 0000   GFRNONAA >60 07/12/2023 0505   GFRNONAA 64 06/10/2020 1541   GFRAA 74 06/10/2020 1541    INR    Component Value Date/Time   INR 1.01 09/15/2014 1637     Intake/Output Summary (Last 24 hours) at 07/12/2023 0831 Last data filed at 07/12/2023 0233 Gross per 24 hour  Intake 1422.21 ml  Output --  Net 1422.21 ml     Assessment/Plan:  68 y.o. female is s/p Aortogram, BLE  angiogram with bilateral CIA and left EIA stenting 1 Day Post-Op   Post intervention she developed visual changes in the PACU Felt to be more related to  anesthesia/ sedation or transient hypotension Continues to have visual changes this morning. No new neurological deficits Appreciate Neurology assistance MRI pending BLE remain well perfused and warm with doppler signals Bilateral CF access sites soft without swelling or hematoma Continue Aspirin, Plavix, statin D/c pending further neuro workup   Dory Horn [facility] [phone] 07/12/2023 8:31 AM  I have independently interviewed and examined patient and agree with PA assessment and plan above.  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  CTA pending.  Neurology input appreciated.  Vivion Romano C. Randie Heinz, MD [facility] [place] Office: [phone] Pager: [phone]

## 2023-07-12 NOTE — Progress Notes (Addendum)
 STROKE TEAM PROGRESS NOTE    SIGNIFICANT HOSPITAL EVENTS 3/3- admitted for bilateral iliac stenting; code stroke called  INTERIM HISTORY/SUBJECTIVE Noted blurry vision and headache towards the end of the procedure and then loss of her peripheral vision on the left. Her symptoms fluctuated and now she has a left upper quadrant hemianopia.    OBJECTIVE  CBC    Component Value Date/Time   WBC 8.5 07/12/2023 0505   RBC 3.66 (L) 07/12/2023 0505   HGB 11.1 (L) 07/12/2023 0505   HGB 12.8 11/16/2012 0000   HCT 34.3 (L) 07/12/2023 0505   HCT 38 11/16/2012 0000   PLT 272 07/12/2023 0505   MCV 93.7 07/12/2023 0505   MCV 86.2 11/16/2012 0000   MCH 30.3 07/12/2023 0505   MCHC 32.4 07/12/2023 0505   RDW 13.1 07/12/2023 0505   LYMPHSABS 3.0 11/09/2022 0338   MONOABS 0.5 11/09/2022 0338   EOSABS 0.3 11/09/2022 0338   BASOSABS 0.0 11/09/2022 0338    BMET    Component Value Date/Time   NA 136 07/12/2023 0505   NA 141 11/16/2012 0000   K 4.0 07/12/2023 0505   K 4.1 11/16/2012 0000   CL 109 07/12/2023 0505   CO2 20 (L) 07/12/2023 0505   GLUCOSE 101 (H) 07/12/2023 0505   BUN 11 07/12/2023 0505   BUN 11 11/16/2012 0000   CREATININE 0.95 07/12/2023 0505   CREATININE 1.09 (H) 12/22/2022 0822   CALCIUM 8.7 (L) 07/12/2023 0505   CALCIUM 9.7 11/16/2012 0000   EGFR 68 06/11/2022 1100   GFRNONAA >60 07/12/2023 0505   GFRNONAA 64 06/10/2020 1541    IMAGING past 24 hours MR BRAIN WO CONTRAST Result Date: 07/12/2023 CLINICAL DATA:  Neuro deficit, acute, stroke suspected. EXAM: MRI HEAD WITHOUT CONTRAST TECHNIQUE: Multiplanar, multiecho pulse sequences of the brain and surrounding structures were obtained without intravenous contrast. COMPARISON:  06/02/2023 FINDINGS: Brain: Diffusion imaging shows acute infarction in the right PCA territory affecting right occipital lobe, posteromedial temporal lobe and portion of the right hippocampus. No evidence of hemorrhage or mass effect. No other acute  infarction. The brainstem and cerebellum are normal. Cerebral hemispheres otherwise show low moderate chronic small-vessel ischemic changes of the white matter. No hydrocephalus or extra-axial collection. Vascular: Major vessels at the base of the brain show flow. Skull and upper cervical spine: Negative Sinuses/Orbits: Clear/normal Other: None IMPRESSION: 1. 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 No evidence of hemorrhage or mass effect. 2. Low moderate chronic small-vessel ischemic changes of the cerebral hemispheric white matter. Electronically Signed   By: Paulina Fusi M.D.   On: 07/12/2023 08:50   LONG TERM MONITOR (3-14 DAYS) Result Date: 07/11/2023 Patch Wear Time:  13 days and 19 hours (2025-02-12T03:29:59-0500 to 2025-02-25T22:44:24-0500) Patient had a min HR of 57 bpm, max HR of 132 bpm, and avg HR of 83 bpm. Predominant underlying rhythm was Sinus Rhythm. Isolated SVEs were rare (<1.0%), SVE Couplets were rare (<1.0%), and no SVE Triplets were present. Isolated VEs were rare (<1.0%), and no VE Couplets or VE Triplets were present. Charlton Haws MD [facility]   PERIPHERAL VASCULAR CATHETERIZATION Result Date: 07/11/2023 Images from the original result were not included. 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 Randie Heinz, MD Procedure Performed: 1.  Percutaneous ultrasound-guided access and  Mynx device closure bilateral common femoral arteries 2.  Catheter in aorta and aortogram 3.  Bilateral lower extremity angiography 4.  Catheter selection right common femoral artery from left common femoral approach 5.  Stent of bilateral common iliac arteries with 7 x 39 mm VBX and stent of left external iliac artery with 7 x 60  mm Eluvia 6.  Moderate sedation with fentanyl and Versed for 51 minutes Indications: 68 year old female with right lower extremity rest pain found to have multilevel occlusive disease.  She is now indicated for angiography with possible endovascular intervention. Findings: The proximal aorta was patent with bilateral renal arteries patent.  The distal terminal aorta was heavily calcified there was a subtotal occlusion of the right common iliac artery with heavily diseased greater than 80% stenosis of the left common iliac artery extending down into the left external iliac artery.  The right lower extremity common femoral was patent the SFA is initially diseased and occludes for approximately 20 cm and reconstitutes above the knee with three-vessel runoff dominant via the ankle.  On the left side the common femoral artery is similarly nondiseased and there is a healthy appearing profunda and left SFA in the lower aspect of the left lower extremity was not evaluated.  After stenting the right common iliac artery there was 0% residual stenosis where previously was subtotally occluded and there is no gradient between the aorta and the external iliac artery with a patent hypogastric.  On the left side after stenting of the left common iliac artery we then extended for persistent gradient of 50 mmHg down to the left external iliac artery with the noncovered drug-eluting stent postdilated with a 7 mm balloon and then there was no gradient from the aorta to the distal external iliac artery. Patient will be reevaluated in 4 to 6 weeks in our office and if she has persistent right lower extremity symptoms would be considered for right common femoral to above-knee popliteal artery bypass with vein versus more likely PTFE.  Procedure:  The patient was identified in the holding area and taken to room 8.  The patient was then placed supine on the table and prepped and draped in the usual sterile fashion.  A time out was called.   Ultrasound was used to evaluate initially the left common femoral artery.  This was free of disease.  The area was anesthetized with 1% lidocaine and cannulated with a micropuncture needle followed by wire and a sheath.  An ultrasound image was saved the permanent record.  Concomitantly we administered fentanyl and Versed as moderate sedation and her vital signs were monitored throughout the case.  We placed a Bentson wire which would not fully track followed by 5 [place] sheath.  We perform retrograde angiography and then using Glidewire advantage and Kumpe catheter were able to track into the aorta and confirmed intraluminal access and an Omni catheter was placed to the level of L1 and aortogram was performed.  We then pulled back to the aortic bifurcation and perform dedicated pelvic angiography and angles.  With the above findings I elected to cross the bifurcation using Glidewire advantage placed catheter in the right common femoral artery and perform right lower extremity angiography.  The SFA did not appear amenable to endovascular intervention given the long segment occlusion with very diminutive artery proximally.  I elected then to retract the catheter in place a Rosen wire into the aorta from the left common femoral artery.  On the right side ultrasound was  then used to identify the right common femoral artery which was also free of disease and this area was anesthetized with 1% lidocaine and the right common femoral artery was then cannulated with a micropuncture needle followed by wire and a sheath.  An ultrasound image again was saved the permanent record.  We placed the Glidewire advantage on this side and using a Kumpe catheter were able to track through the aorta and confirmed intraluminal access.  Long 6 French sheath were then placed from both sides into the aorta and aortogram was performed which identified the bilateral common iliac artery occlusive disease.  We then deployed two 7 x 39 mm VBX's  to nominal pressure.  I completion we performed gradients on the right side there was no gradient on the left side there was a 50 mmHg gradient.  Dedicated RAO angiography was then performed and we extended down into the external iliac artery with a 7 x 60 mm drug-eluting stent postdilated with 7 mm balloon.  At completion there was no residual stenosis identified angiographically and there was no further gradient.  Satisfied with this we exchanged for short 6 French sheath bilaterally and deployed minx devices.  The patient tolerated the procedure without any complication. Contrast: 70cc Brandon C. Randie Heinz, MD [facility] Office: [phone] Pager: [phone]    Vitals:   07/12/23 0400 07/12/23 0511 07/12/23 0600 07/12/23 0755  BP: (!) 101/55 111/63 133/74 (!) 109/54  Pulse: 75 72  70  Resp: 13 16 15 14   Temp:  97.7 F (36.5 C)  98 F (36.7 C)  TempSrc:  Oral  Oral  SpO2: 96% 98%  99%  Weight:      Height:         PHYSICAL EXAM General:  Alert, well-nourished, well-developed patient in no acute distress Psych:  Mood and affect appropriate for situation CV: Regular rate and rhythm on monitor Respiratory:  Regular, unlabored respirations on room air GI: Abdomen soft and nontender   NEURO:  Mental Status: AA&Ox3, patient is able to give clear and coherent history Speech/Language: speech is without dysarthria or aphasia.  Naming, repetition, fluency, and comprehension intact.  Cranial Nerves:  II: PERRL. Left upper quadrant hemianopia III, IV, VI: EOMI. Eyelids elevate symmetrically.  V: Sensation is intact to light touch and symmetrical to face.  VII: Face is symmetrical resting and smiling VIII: hearing intact to voice. IX, X: Palate elevates symmetrically. Phonation is normal.  MW:[id number] shrug 5/5. XII: tongue is midline without fasciculations. Motor: 5/5 strength to all muscle groups tested.  Tone: is normal and bulk is normal Sensation-  Intact to light touch bilaterally. Extinction absent to light touch to DSS.   Coordination: FTN intact bilaterally, HKS: no ataxia in BLE.No drift.  Gait- deferred  Most Recent NIH 1     ASSESSMENT/PLAN  [name] is a 69 y.o. female with history of CAD admitted for left lower extremity claudication. She had a stenting of her bilateral common iliac arteries done 3/3 by Dr Randie Heinz. Code stroke activated in PACU due to left hemianopia.  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 No evidence of hemorrhage or mass effect. 2D Echo EF 55-60% LDL 75 HgbA1c 6.0 VTE prophylaxis - heparin 5000u q8hr aspirin 81 mg  daily prior to admission, now on aspirin 81 mg daily and clopidogrel 75 mg daily duration per vascular surgery  Therapy recommendations:  Pending Disposition:  pending   PVD Status post bilateral common iliac artery stenting and left external iliac artery stenting Procedure 07/11/2023 with Dr. Randie Heinz BLE remain well perfused and warm with doppler signals Bilateral CF access sites soft without swelling or hematoma Continue Aspirin, Plavix, statin  Hypertension Home meds: Coreg, Norvasc, avapro, aldactone Stable on the low end Hold avapro, aldactone Continue Coreg and spironolactone Long-term BP goal normotensive  Hyperlipidemia Home meds:  Rosuvastatin 10 LDL 75, goal < 70 Increase Crestor 10 to 20 Continue statin at discharge  Other Stroke Risk Factors Coronary artery disease   Hospital day # 1  Marvel Plan, MD PhD Stroke Neurology 07/12/2023 7:21 PM    To contact Stroke Continuity provider, please refer to [email or web address]. After hours, contact General Neurology

## 2023-07-12 NOTE — Plan of Care (Signed)

## 2023-07-13 DIAGNOSIS — I63431 Cerebral infarction due to embolism of right posterior cerebral artery: Secondary | ICD-10-CM | POA: Diagnosis not present

## 2023-07-13 DIAGNOSIS — I739 Peripheral vascular disease, unspecified: Secondary | ICD-10-CM | POA: Diagnosis not present

## 2023-07-13 NOTE — Discharge Instructions (Signed)
   [facility]  Discharge Instructions  Lower Extremity Angiogram; Angioplasty/Stenting  Please refer to the following instructions for your post-procedure care. Your surgeon or physician assistant will discuss any changes with you.  Activity  Avoid lifting more than 8 pounds (1 gallons of milk) for 72 hours (3 days) after your procedure. You may walk as much as you can tolerate. It's OK to drive after 72 hours.  Bathing/Showering  You may shower the day after your procedure. If you have a bandage, you may remove it at 24- 48 hours. Clean your incision site with mild soap and water. Pat the area dry with a clean towel.  Diet  Resume your pre-procedure diet. There are no special food restrictions following this procedure. All patients with peripheral vascular disease should follow a low fat/low cholesterol diet. In order to heal from your surgery, it is CRITICAL to get adequate nutrition. Your body requires vitamins, minerals, and protein. Vegetables are the best source of vitamins and minerals. Vegetables also provide the perfect balance of protein. Processed food has little nutritional value, so try to avoid this.  Medications  Resume taking all of your medications unless your doctor tells you not to. If your incision is causing pain, you may take over-the-counter pain relievers such as acetaminophen (Tylenol)  Follow Up  Follow up will be arranged at the time of your procedure. You may have an office visit scheduled or may be scheduled for surgery. Ask your surgeon if you have any questions.  Please call us immediately for any of the following conditions: Severe or worsening pain your legs or feet at rest or with walking. Increased pain, redness, drainage at your groin puncture site. Fever of 101 degrees or higher. If you have any mild or slow bleeding from your puncture site: lie down, apply firm constant pressure over the area with a piece of  gauze or a clean wash cloth for 30 minutes- no peeking!, call 911 right away if you are still bleeding after 30 minutes, or if the bleeding is heavy and unmanageable.  Reduce your risk factors of vascular disease:  Stop smoking. If you would like help call [facility] at 1-800-QUIT-NOW (254-819-8059) or [place] at (250)353-2060. Manage your cholesterol Maintain a desired weight Control your diabetes Keep your blood pressure down  If you have any questions, please call the office at [phone]

## 2023-07-13 NOTE — Progress Notes (Signed)
 [name] to be D/C'd home per MD order. Discussed with the patient and daughter, Georgiann Hahn, and all questions fully answered.  Skin clean and dry, dressings changed to bilateral groins prior to discharge.   IV catheter discontinued intact. Site without signs and symptoms of complications. Dressing and pressure applied.  An After Visit Summary was printed and given to the patient.  Patient escorted via WC, and D/C home with daughter via private auto.  Jon Gills  07/13/2023

## 2023-07-13 NOTE — Progress Notes (Signed)
 STROKE TEAM PROGRESS NOTE   INTERIM HISTORY/SUBJECTIVE Daughter at the bedside. 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 CTA showed right ICA bulb 60% and siphon severe stenosis.     OBJECTIVE  CBC    Component Value Date/Time   WBC 8.5 07/12/2023 0505   RBC 3.66 (L) 07/12/2023 0505   HGB 11.1 (L) 07/12/2023 0505   HGB 12.8 11/16/2012 0000   HCT 34.3 (L) 07/12/2023 0505   HCT 38 11/16/2012 0000   PLT 272 07/12/2023 0505   MCV 93.7 07/12/2023 0505   MCV 86.2 11/16/2012 0000   MCH 30.3 07/12/2023 0505   MCHC 32.4 07/12/2023 0505   RDW 13.1 07/12/2023 0505   LYMPHSABS 3.0 11/09/2022 0338   MONOABS 0.5 11/09/2022 0338   EOSABS 0.3 11/09/2022 0338   BASOSABS 0.0 11/09/2022 0338    BMET    Component Value Date/Time   NA 136 07/12/2023 0505   NA 141 11/16/2012 0000   K 4.0 07/12/2023 0505   K 4.1 11/16/2012 0000   CL 109 07/12/2023 0505   CO2 20 (L) 07/12/2023 0505   GLUCOSE 101 (H) 07/12/2023 0505   BUN 11 07/12/2023 0505   BUN 11 11/16/2012 0000   CREATININE 0.95 07/12/2023 0505   CREATININE 1.09 (H) 12/22/2022 0822   CALCIUM 8.7 (L) 07/12/2023 0505   CALCIUM 9.7 11/16/2012 0000   EGFR 68 06/11/2022 1100   GFRNONAA >60 07/12/2023 0505   GFRNONAA 64 06/10/2020 1541    IMAGING past 24 hours CT ANGIO HEAD NECK W WO CM Result Date: 07/13/2023 CLINICAL DATA:  Stroke/TIA, determine embolic source EXAM: CT ANGIOGRAPHY HEAD AND NECK WITH AND WITHOUT CONTRAST TECHNIQUE: Multidetector CT imaging of the head and neck was performed using the standard protocol during bolus administration of intravenous contrast. Multiplanar CT image reconstructions and MIPs were obtained to evaluate the vascular anatomy. Carotid stenosis measurements (when applicable) are obtained utilizing NASCET criteria, using the distal internal carotid diameter as the denominator. RADIATION DOSE REDUCTION: This exam was performed according to the departmental dose-optimization program  which includes automated exposure control, adjustment of the mA and/or kV according to patient size and/or use of iterative reconstruction technique. CONTRAST:  75mL OMNIPAQUE IOHEXOL 350 MG/ML SOLN COMPARISON:  MRI head from today. FINDINGS: CT HEAD FINDINGS Brain: Acute right PCA territory infarct, further characterized on MRI from today. No evidence of acute hemorrhage, mass lesion, midline shift or hydrocephalus. Vascular: See below. Skull: No acute fracture. Sinuses/Orbits: Clear sinuses.  No acute orbital findings. Other: No mastoid effusions. Review of the MIP images confirms the above findings CTA NECK FINDINGS Aortic arch: Great vessel origins are patent aortic atherosclerosis. Right carotid system: Atherosclerosis at the carotid bifurcation involving the proximal ICA with approximately 60% stenosis the ICA origin. Left carotid system: Atherosclerosis at the carotid bifurcation without greater than 50% stenosis. Vertebral arteries: Severe stenosis of the right vertebral artery origin. Both vertebral arteries are patent. Skeleton: No acute fracture on limited assessment. Other neck: No acute abnormality on limited assessment. Upper chest: Visualized lung apices are clear. Review of the MIP images confirms the above findings CTA HEAD FINDINGS Anterior circulation: Severe stenosis of the right intracranial I see a. Left intracranial ICA is patent without significant stenosis. Bilateral MCAs and ACAs are patent without proximal hemodynamically significant stenosis. Posterior circulation: Bilateral intradural vertebral arteries, basilar artery and bilateral posterior cerebral arteries are patent without proximal hemodynamically significant stenosis. Venous sinuses: As permitted by contrast timing, patent. Review of the MIP  images confirms the above findings IMPRESSION: 1. Severe right intracranial ICA stenosis. 2. Severe right vertebral artery origin stenosis. 3. Approximately 60% stenosis of the right ICA  origin. 4. Aortic Atherosclerosis (ICD10-I70.0). Electronically Signed   By: Feliberto Harts M.D.   On: 07/13/2023 03:06   ECHOCARDIOGRAM COMPLETE Result Date: 07/12/2023    ECHOCARDIOGRAM REPORT   Patient Name:   [name] Date of Exam: 07/12/2023 Medical Rec #:  [id number]          Height:       67.0 in Accession #:    [id number]         Weight:       156.6 lb Date of Birth:  [birth year]          BSA:          1.823 m Patient Age:    67 years           BP:           133/61 mmHg Patient Gender: F                  HR:           79 bpm. Exam Location:  Inpatient Procedure: 2D Echo, Color Doppler, Cardiac Doppler and Strain Analysis (Both            Spectral and Color Flow Doppler were utilized during procedure). Indications:    Stroke  History:        Patient has prior history of Echocardiogram examinations, most                 recent 02/04/2022. Risk Factors:Hypertension and Diabetes.  Sonographer:    Webb Laws Referring Phys: [id number] Clemons Salvucci IMPRESSIONS  1. Left ventricular ejection fraction, by estimation, is 55 to 60%. The left ventricle has normal function. The left ventricle has no regional wall motion abnormalities. There is mild asymmetric left ventricular hypertrophy of the basal-septal segment. Left ventricular diastolic parameters were normal. The average left ventricular global longitudinal strain is -23.2 %. The global longitudinal strain is normal.  2. Right ventricular systolic function is normal. The right ventricular size is normal. Tricuspid regurgitation signal is inadequate for assessing PA pressure.  3. The mitral valve is grossly normal. No evidence of mitral valve regurgitation. No evidence of mitral stenosis.  4. The aortic valve is tricuspid. Aortic valve regurgitation is not visualized. No aortic stenosis is present.  5. The inferior vena cava is normal in size with greater than 50% respiratory variability, suggesting right atrial pressure of 3 mmHg. Comparison(s): No  significant change from prior study. Conclusion(s)/Recommendation(s): No intracardiac source of embolism detected on this transthoracic study. Consider a transesophageal echocardiogram to exclude cardiac source of embolism if clinically indicated. FINDINGS  Left Ventricle: Left ventricular ejection fraction, by estimation, is 55 to 60%. The left ventricle has normal function. The left ventricle has no regional wall motion abnormalities. The average left ventricular global longitudinal strain is -23.2 %. Strain was performed and the global longitudinal strain is normal. The left ventricular internal cavity size was normal in size. There is mild asymmetric left ventricular hypertrophy of the basal-septal segment. Left ventricular diastolic parameters were  normal. Right Ventricle: The right ventricular size is normal. No increase in right ventricular wall thickness. Right ventricular systolic function is normal. Tricuspid regurgitation signal is inadequate for assessing PA pressure. Left Atrium: Left atrial size was normal in size. Right Atrium: Right atrial size was normal in size. Pericardium:  There is no evidence of pericardial effusion. Mitral Valve: The mitral valve is grossly normal. No evidence of mitral valve regurgitation. No evidence of mitral valve stenosis. Tricuspid Valve: The tricuspid valve is grossly normal. Tricuspid valve regurgitation is trivial. No evidence of tricuspid stenosis. Aortic Valve: The aortic valve is tricuspid. Aortic valve regurgitation is not visualized. No aortic stenosis is present. Pulmonic Valve: The pulmonic valve was grossly normal. Pulmonic valve regurgitation is not visualized. No evidence of pulmonic stenosis. Aorta: The aortic root is normal in size and structure. Venous: The inferior vena cava is normal in size with greater than 50% respiratory variability, suggesting right atrial pressure of 3 mmHg. IAS/Shunts: The atrial septum is grossly normal.  LEFT VENTRICLE PLAX 2D  LVIDd:         3.70 cm     Diastology LVIDs:         2.90 cm     LV e' medial:    7.29 cm/s LV PW:         1.00 cm     LV E/e' medial:  6.4 LV IVS:        1.40 cm     LV e' lateral:   8.27 cm/s LVOT diam:     1.90 cm     LV E/e' lateral: 5.6 LV SV:         51 LV SV Index:   28          2D Longitudinal Strain LVOT Area:     2.84 cm    2D Strain GLS Avg:     -23.2 %  LV Volumes (MOD) LV vol d, MOD A2C: 57.1 ml LV vol d, MOD A4C: 61.4 ml LV vol s, MOD A2C: 30.8 ml LV vol s, MOD A4C: 19.5 ml LV SV MOD A2C:     26.3 ml LV SV MOD A4C:     61.4 ml LV SV MOD BP:      34.3 ml RIGHT VENTRICLE             IVC RV Basal diam:  2.90 cm     IVC diam: 1.00 cm RV S prime:     12.10 cm/s TAPSE (M-mode): 2.4 cm LEFT ATRIUM             Index        RIGHT ATRIUM           Index LA diam:        3.30 cm 1.81 cm/m   RA Area:     11.70 cm LA Vol (A2C):   20.6 ml 11.30 ml/m  RA Volume:   22.70 ml  12.45 ml/m LA Vol (A4C):   34.5 ml 18.93 ml/m LA Biplane Vol: 27.1 ml 14.87 ml/m  AORTIC VALVE LVOT Vmax:   93.80 cm/s LVOT Vmean:  62.600 cm/s LVOT VTI:    0.180 m  AORTA Ao Root diam: 2.60 cm MITRAL VALVE MV Area (PHT): 4.26 cm    SHUNTS MV Decel Time: 178 msec    Systemic VTI:  0.18 m MV E velocity: 46.50 cm/s  Systemic Diam: 1.90 cm MV A velocity: 91.10 cm/s MV E/A ratio:  0.51 Lennie Odor MD Electronically signed by Lennie Odor MD Signature Date/Time: 07/12/2023/3:26:33 PM    Final     Vitals:   07/12/23 1949 07/12/23 2307 07/13/23 0454 07/13/23 0733  BP: 126/64 (!) 130/54 122/60 109/60  Pulse: 86 78 90 87  Resp: 17 18 13 17   Temp: 98.5 F (  36.9 C) 98.4 F (36.9 C) 98.8 F (37.1 C) 98.4 F (36.9 C)  TempSrc: Oral Oral Oral Oral  SpO2: 98% 98% 98% 97%  Weight:      Height:         PHYSICAL EXAM General:  Alert, well-nourished, well-developed patient in no acute distress Psych:  Mood and affect appropriate for situation CV: Regular rate and rhythm on monitor Respiratory:  Regular, unlabored respirations on room  air GI: Abdomen soft and nontender   NEURO:  Mental Status: AA&Ox3, patient is able to give clear and coherent history Speech/Language: speech is without dysarthria or aphasia.  Naming, repetition, fluency, and comprehension intact.  Cranial Nerves:  II: PERRL. Left upper quadrant hemianopia III, IV, VI: EOMI. Eyelids elevate symmetrically.  V: Sensation is intact to light touch and symmetrical to face.  VII: Face is symmetrical resting and smiling VIII: hearing intact to voice. IX, X: Palate elevates symmetrically. Phonation is normal.  ZO:[id number] shrug 5/5. XII: tongue is midline without fasciculations. Motor: 5/5 strength to all muscle groups tested.  Tone: is normal and bulk is normal Sensation- Intact to light touch bilaterally. Extinction absent to light touch to DSS.   Coordination: FTN intact bilaterally, HKS: no ataxia in BLE.No drift.  Gait- deferred  Most Recent NIH 1     ASSESSMENT/PLAN  [name] is a 68 y.o. female with history of CAD admitted for left lower extremity claudication. She had a stenting of her bilateral common iliac arteries done 3/3 by Dr Randie Heinz. Code stroke activated in PACU due to left hemianopia.  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 No evidence of hemorrhage or mass effect. 2D Echo EF 55-60% LDL 75 HgbA1c 6.0 VTE prophylaxis - heparin 5000u q8hr aspirin 81 mg daily prior to admission, now on aspirin 81 mg daily and clopidogrel 75 mg daily duration per vascular surgery  Disposition:  home today   PVD Status post bilateral common iliac artery stenting and left external iliac artery stenting Procedure 07/11/2023 with Dr. Randie Heinz BLE remain well perfused and warm with doppler  signals Bilateral CF access sites soft without swelling or hematoma Continue Aspirin, Plavix, statin  Carotid stenosis CTA head & neck Right ICA bulb 60% and siphon severe stenosis. Left ICA bulb and siphon asthero but no significant stenosis.  No fetal PCA on the right, although PCOM can be easily seen.  R ICA still considered asymptomatic Continue follow up with Dr. Randie Heinz as outpt for monitoring  Hypertension Home meds: Coreg, Norvasc, avapro, aldactone Stable on the low end Hold avapro, amlodipine Continue Coreg and recommend to hold spironolactone also Avoid low BP given right ICA high grade stenosis at siphon and 60% at bulb Long-term BP goal normotensive  Hyperlipidemia Home meds:  Rosuvastatin 10 LDL 75, goal < 70 Increase Crestor 10 to 20 Continue statin at discharge  Other Stroke Risk Factors Coronary artery disease   Hospital day # 2  Neurology will sign off. Please call with questions. Pt will follow up with [facility] at [facility] on 07/19/22 as scheduled. Thanks for the consult.   Marvel Plan, MD PhD Stroke Neurology 07/13/2023 11:17 AM    To contact Stroke Continuity provider, please refer to [email or web address]. After hours, contact General Neurology

## 2023-07-13 NOTE — Progress Notes (Addendum)
 Progress Note    07/13/2023 6:40 AM 2 Days Post-Op  Subjective:  says she feels better but is having some tingling in her right fingers and toes.  Says she had it before and it got better but is back.  Vision still blurry but she feels it has improved.   Afebrile HR 80's-90's 100's-130's systolic 97% RA  Vitals:   07/12/23 2307 07/13/23 0454  BP: (!) 130/54 122/60  Pulse: 78 90  Resp: 18 13  Temp: 98.4 F (36.9 C) 98.8 F (37.1 C)  SpO2: 98% 98%    Physical Exam: General:  no distress Lungs:  non labored   CBC    Component Value Date/Time   WBC 8.5 07/12/2023 0505   RBC 3.66 (L) 07/12/2023 0505   HGB 11.1 (L) 07/12/2023 0505   HGB 12.8 11/16/2012 0000   HCT 34.3 (L) 07/12/2023 0505   HCT 38 11/16/2012 0000   PLT 272 07/12/2023 0505   MCV 93.7 07/12/2023 0505   MCV 86.2 11/16/2012 0000   MCH 30.3 07/12/2023 0505   MCHC 32.4 07/12/2023 0505   RDW 13.1 07/12/2023 0505   LYMPHSABS 3.0 11/09/2022 0338   MONOABS 0.5 11/09/2022 0338   EOSABS 0.3 11/09/2022 0338   BASOSABS 0.0 11/09/2022 0338    BMET    Component Value Date/Time   NA 136 07/12/2023 0505   NA 141 11/16/2012 0000   K 4.0 07/12/2023 0505   K 4.1 11/16/2012 0000   CL 109 07/12/2023 0505   CO2 20 (L) 07/12/2023 0505   GLUCOSE 101 (H) 07/12/2023 0505   BUN 11 07/12/2023 0505   BUN 11 11/16/2012 0000   CREATININE 0.95 07/12/2023 0505   CREATININE 1.09 (H) 12/22/2022 0822   CALCIUM 8.7 (L) 07/12/2023 0505   CALCIUM 9.7 11/16/2012 0000   GFRNONAA >60 07/12/2023 0505   GFRNONAA 64 06/10/2020 1541   GFRAA 74 06/10/2020 1541    INR    Component Value Date/Time   INR 1.01 09/15/2014 1637     Intake/Output Summary (Last 24 hours) at 07/13/2023 0640 Last data filed at 07/12/2023 1949 Gross per 24 hour  Intake 240 ml  Output 3 ml  Net 237 ml    MRI 07/12/2023: IMPRESSION: 1. Acute infarction in the right PCA territory affecting right occipital lobe, posteromedial temporal lobe and portion  of the right hippocampus. No evidence of hemorrhage or mass effect. 2. Low moderate chronic small-vessel ischemic changes of the cerebral hemispheric white matter.  CT 07/12/2023 IMPRESSION: 1. Severe right intracranial ICA stenosis. 2. Severe right vertebral artery origin stenosis. 3. Approximately 60% stenosis of the right ICA origin. 4. Aortic Atherosclerosis   Assessment/Plan:  68 y.o. female is s/p:  BLE angiogram with bilateral CIA and left EIA stenting   2 Days Post-Op   -pt feels her vision is still blurry but better.  Having tingling in her right fingers and toes.  MRI did show acute infarction in the right PCA territory affecting right occipital lobe,  posteromedial temporal lobe and portion of the right hippocampus. No evidence of hemorrhage or mass effect.  Neurology following and will provide recommendations.     Doreatha Massed, PA-C [facility] [phone] 07/13/2023 6:40 AM  I have independently interviewed and patient and agree with PA assessment and plan above.  Vision somewhat improved this morning.  CTA results discussed with patient and her daughter.  From a vascular standpoint she appears to have much improved perfusion of her lower extremities and we  will see her back in 4 to 6 weeks to discuss possible need for bypass.  Hopefully her vision will continue to improve and will continue on aspirin and Plavix.  Will follow-up neurology recommendations and possible discharge soon.  Cristalle Rohm C. Randie Heinz, MD [facility] [place] Office: [phone] Pager: [phone]

## 2023-07-13 NOTE — Discharge Summary (Signed)
 Discharge Summary    [name] 1955/06/08 68 y.o. female  [id number]  Admission Date: 07/11/2023  Discharge Date: 07/13/2023  Physician: Juventino Slovak*  Admission Diagnosis: PAD (peripheral artery disease) (HCC) [I73.9]   HPI:   This is a 68 y.o. female  history of carotid artery disease and also life limiting right greater than left lower extremity claudication.  She was lastevaluated here in October and now here follows up for further evaluation.  She does not have any tissue loss or ulceration but she states that she does have significant pain in her right lower extremity particularly when walking and at night and she states there are no alleviating factors.  She is a former smoker quit over 10 years ago.  She remains on aspirin and a statin.   Hospital Course:  The patient was admitted to the hospital and taken to the [facility] lab on 07/11/2023 and underwent: 1.  Percutaneous ultrasound-guided access and Mynx device closure bilateral common femoral arteries 2.  Catheter in aorta and aortogram 3.  Bilateral lower extremity angiography 4.  Catheter selection right common femoral artery from left common femoral approach 5.  Stent of bilateral common iliac arteries with 7 x 39 mm VBX and stent of left external iliac artery with 7 x 60 mm Eluvia 6.  Moderate sedation with fentanyl and Versed for 51 minutes    Findings as follows: The proximal aorta was patent with bilateral renal arteries patent.  The distal terminal aorta was heavily calcified there was a subtotal occlusion of the right common iliac artery with heavily diseased greater than 80% stenosis of the left common iliac artery extending down into the left external iliac artery.  The right lower extremity common femoral was patent the SFA is initially diseased and occludes for approximately 20 cm and reconstitutes above the knee with three-vessel runoff dominant via the ankle.  On the left side the common femoral  artery is similarly nondiseased and there is a healthy appearing profunda and left SFA in the lower aspect of the left lower extremity was not evaluated.  After stenting the right common iliac artery there was 0% residual stenosis where previously was subtotally occluded and there is no gradient between the aorta and the external iliac artery with a patent hypogastric.  On the left side after stenting of the left common iliac artery we then extended for persistent gradient of 50 mmHg down to the left external iliac artery with the noncovered drug-eluting stent postdilated with a 7 mm balloon and then there was no gradient from the aorta to the distal external iliac artery.   Patient will be reevaluated in 4 to 6 weeks in our office and if she has persistent right lower extremity symptoms would be considered for right common femoral to above-knee popliteal artery bypass with vein versus more likely PTFE.  The pt tolerated the procedure well and was transported to the PACU in good condition.  While in the recovery room, she started having a visual disturbance.  She was evaluated by Dr. Karin Lieu and pt admitted for stroke work up.    Neurology was consulted.   Neuro recommendations: - MRI brain - Frequent neuro checks - Antiplatelet medication per Vascular Surgery - Hydrate well and avoid hypotension  She was continued on asa/plavix/statin.   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  Neuro recommended avoiding low blood pressure given right ICA high grade stenosis at siphon and 60% at the bulb.  Her Avapro, Norvasc and spironolactone held at discharge.  Her crestor was increased to 20mg  daily.    She was discharged home.   CBC    Component Value Date/Time   WBC 8.5 07/12/2023 0505   RBC 3.66 (L) 07/12/2023 0505   HGB 11.1 (L) 07/12/2023 0505   HGB 12.8 11/16/2012 0000   HCT 34.3 (L) 07/12/2023 0505   HCT 38 11/16/2012 0000   PLT 272 07/12/2023 0505   MCV 93.7  07/12/2023 0505   MCV 86.2 11/16/2012 0000   MCH 30.3 07/12/2023 0505   MCHC 32.4 07/12/2023 0505   RDW 13.1 07/12/2023 0505   LYMPHSABS 3.0 11/09/2022 0338   MONOABS 0.5 11/09/2022 0338   EOSABS 0.3 11/09/2022 0338   BASOSABS 0.0 11/09/2022 0338    BMET    Component Value Date/Time   NA 136 07/12/2023 0505   NA 141 11/16/2012 0000   K 4.0 07/12/2023 0505   K 4.1 11/16/2012 0000   CL 109 07/12/2023 0505   CO2 20 (L) 07/12/2023 0505   GLUCOSE 101 (H) 07/12/2023 0505   BUN 11 07/12/2023 0505   BUN 11 11/16/2012 0000   CREATININE 0.95 07/12/2023 0505   CREATININE 1.09 (H) 12/22/2022 0822   CALCIUM 8.7 (L) 07/12/2023 0505   CALCIUM 9.7 11/16/2012 0000   GFRNONAA >60 07/12/2023 0505   GFRNONAA 64 06/10/2020 1541   GFRAA 74 06/10/2020 1541      Discharge Instructions     Discharge patient   Complete by: As directed    Discharge disposition: 01-Home or Self Care   Discharge patient date: 07/13/2023       Discharge Diagnosis:  PAD (peripheral artery disease) (HCC) [I73.9]  Secondary Diagnosis: Patient Active Problem List   Diagnosis Date Noted   PAD (peripheral artery disease) (HCC) 07/11/2023   Left-sided chest wall pain 05/02/2023   Migraine aura, persistent, intractable, with status migrainosus 04/13/2023   Viral URI with cough 11/24/2022   Acute bacterial rhinosinusitis 11/24/2022   Chronic nonintractable headache 10/12/2021   Non-seasonal allergic rhinitis 05/19/2021   Pulsatile tinnitus of left ear 05/19/2021   Excessive cerumen in ear canal, right 05/19/2021   Hyperlipidemia    Hypertension    Abdominal aortic aneurysm (AAA) 3.0 cm to 5.0 cm in diameter in female [facility])    Nausea without vomiting 12/06/2018   Loss of weight 12/06/2018   Controlled diabetes mellitus type 2 with complications (HCC) 07/26/2017   Essential hypertension 04/25/2017   Hyperlipidemia LDL goal <70 04/25/2017   Carotid artery stenosis without cerebral infarction, bilateral  04/25/2017   AAA (abdominal aortic aneurysm) without rupture (HCC) 04/25/2017   History of colonic polyps    Diverticulosis of colon without hemorrhage    RUQ pain 02/21/2013   Abdominal pain, epigastric 01/05/2013   Abdominal bruit 01/05/2013   Esophageal dysphagia 01/05/2013   Constipation 01/05/2013   GERD (gastroesophageal reflux disease) 01/05/2013   Past Medical History:  Diagnosis Date   Anxiety    Carotid stenosis, asymptomatic, bilateral    Cataract    Chronic left shoulder pain    Headache    Hiatal hernia    small   Hyperlipidemia    Hypertension    Osteoporosis    Pre-diabetes    Schatzki's ring    Seasonal allergies    Tremor      Allergies as of 07/13/2023       Reactions   Ciprofloxacin Shortness Of Breath   Latex Other (See Comments)   Lawerance Bach  skin   Vitamin C [bioflavonoid Products] Itching        Medication List     STOP taking these medications    amLODipine 10 MG tablet Commonly known as: NORVASC   spironolactone 25 MG tablet Commonly known as: ALDACTONE   valsartan 320 MG tablet Commonly known as: DIOVAN       TAKE these medications    acetaminophen 500 MG tablet Commonly known as: TYLENOL Take 500 mg by mouth every 6 (six) hours as needed for moderate pain or headache.   alendronate 70 MG tablet Commonly known as: FOSAMAX TAKE 1 TABLET(70 MG) BY MOUTH EVERY 7 DAYS WITH A FULL GLASS OF WATER AND ON AN EMPTY STOMACH   ALPRAZolam 0.5 MG tablet Commonly known as: XANAX TAKE 1 TABLET(0.5 MG) BY MOUTH TWICE DAILY AS NEEDED FOR ANXIETY   aspirin EC 81 MG tablet Take 81 mg by mouth at bedtime.   B-12 2500 MCG Tabs Take 2,500 mcg by mouth daily.   Calcium 500 MG tablet Take 500 mg by mouth in the morning and at bedtime.   carvedilol 3.125 MG tablet Commonly known as: COREG TAKE 1 TABLET(3.125 MG) BY MOUTH TWICE DAILY   cilostazol 50 MG tablet Commonly known as: PLETAL Take 1 tablet (50 mg total) by mouth 2 (two) times  daily.   clopidogrel 75 MG tablet Commonly known as: Plavix Take 1 tablet (75 mg total) by mouth daily.   CoQ10 100 MG Caps Take 100 mg by mouth daily.   CRANBERRY PO Take 1 each by mouth 2 (two) times a week. Chewables   dexlansoprazole 60 MG capsule Commonly known as: DEXILANT Take 1 capsule (60 mg total) by mouth daily.   EPINEPHrine 0.3 mg/0.3 mL Soaj injection Commonly known as: EPI-PEN Inject 0.3 mg into the muscle as needed for anaphylaxis.   Fish Oil 1000 MG Caps Take 1,000 mg by mouth 3 (three) times a week.   fluticasone 50 MCG/ACT nasal spray Commonly known as: FLONASE Place 2 sprays into both nostrils daily as needed for allergies.   hydroquinone 4 % cream Apply 1 application topically 2 (two) times daily as needed (dark spots).   hydrOXYzine 25 MG capsule Commonly known as: VISTARIL TAKE 1 CAPSULE(25 MG) BY MOUTH EVERY 8 HOURS AS NEEDED   ipratropium 0.03 % nasal spray Commonly known as: ATROVENT INSTILL 2 SPRAYS IN EACH NOSTRIL EVERY 12 HOURS What changed: See the new instructions.   loratadine 10 MG tablet Commonly known as: CLARITIN Take 10 mg by mouth daily as needed for allergies.   MAGNESIUM PO Take 330 mg by mouth daily.   ondansetron 4 MG tablet Commonly known as: ZOFRAN TAKE 1 TABLET(4 MG) BY MOUTH EVERY 8 HOURS AS NEEDED FOR NAUSEA OR VOMITING   rosuvastatin 10 MG tablet Commonly known as: CRESTOR TAKE 1 TABLET(10 MG) BY MOUTH DAILY   sodium chloride 0.65 % Soln nasal spray Commonly known as: OCEAN Place 1 spray into both nostrils as needed for congestion.   triamcinolone cream 0.1 % Commonly known as: KENALOG Apply 1 Application topically 2 (two) times daily. What changed:  when to take this reasons to take this   Trulance 3 MG Tabs Generic drug: Plecanatide Take 3 mg by mouth daily.   Vitamin D 50 MCG (2000 UT) Caps Take 2,000 Units by mouth daily.   vitamin E 180 MG (400 UNITS) capsule Take 400 Units by mouth 3 (three)  times a week.   zinc gluconate 50 MG tablet  Take 50 mg by mouth 3 (three) times a week.          Instructions:  [facility]  Discharge Instructions  Lower Extremity Angiogram; Angioplasty/Stenting  Please refer to the following instructions for your post-procedure care. Your surgeon or physician assistant will discuss any changes with you.  Activity  Avoid lifting more than 8 pounds (1 gallons of milk) for 72 hours (3 days) after your procedure. You may walk as much as you can tolerate. It's OK to drive after 72 hours.  Bathing/Showering  You may shower the day after your procedure. If you have a bandage, you may remove it at 24- 48 hours. Clean your incision site with mild soap and water. Pat the area dry with a clean towel.  Diet  Resume your pre-procedure diet. There are no special food restrictions following this procedure. All patients with peripheral vascular disease should follow a low fat/low cholesterol diet. In order to heal from your surgery, it is CRITICAL to get adequate nutrition. Your body requires vitamins, minerals, and protein. Vegetables are the best source of vitamins and minerals. Vegetables also provide the perfect balance of protein. Processed food has little nutritional value, so try to avoid this.  Medications  Resume taking all of your medications unless your doctor tells you not to. If your incision is causing pain, you may take over-the-counter pain relievers such as acetaminophen (Tylenol)  Follow Up  Follow up will be arranged at the time of your procedure. You may have an office visit scheduled or may be scheduled for surgery. Ask your surgeon if you have any questions.  Please call us immediately for any of the following conditions: Severe or worsening pain your legs or feet at rest or with walking. Increased pain, redness, drainage at your groin puncture site. Fever of 101 degrees or higher. If you have any  mild or slow bleeding from your puncture site: lie down, apply firm constant pressure over the area with a piece of gauze or a clean wash cloth for 30 minutes- no peeking!, call 911 right away if you are still bleeding after 30 minutes, or if the bleeding is heavy and unmanageable.  Reduce your risk factors of vascular disease:  Stop smoking. If you would like help call [facility] at 1-800-QUIT-NOW (602-257-8861) or [place] at (334)519-8166. Manage your cholesterol Maintain a desired weight Control your diabetes Keep your blood pressure down  If you have any questions, please call the office at [phone]  Prescriptions given:  Plavix 75mg  daily  Disposition: home  Patient's condition: is Good  Follow up: 1. Dr. Randie Heinz in 6 weeks with arterial studies   Doreatha Massed, PA-C [facility] [phone] 07/13/2023  12:36 PM

## 2023-07-13 NOTE — Progress Notes (Signed)
   07/13/23 1320  TOC Brief Assessment  Insurance and Status Reviewed  Patient has primary care physician Yes  Home environment has been reviewed home  Prior level of function: self/independent  Prior/Current Home Services No current home services  Social Drivers of Health Review SDOH reviewed no interventions necessary  Readmission risk has been reviewed Yes  Transition of care needs no transition of care needs at this time    Pt stable for transition home today, no HH or DME needs noted. Family to transport home

## 2023-07-14 ENCOUNTER — Telehealth: Payer: Self-pay | Admitting: *Deleted

## 2023-07-14 NOTE — Transitions of Care (Post Inpatient/ED Visit) (Signed)
   07/14/2023  Name: [name] MRN: [id number] DOB: 1955/08/07  Today's TOC FU Call Status: Today's TOC FU Call Status:: Unsuccessful Call (1st Attempt) Unsuccessful Call (1st Attempt) Date: 07/14/23  Attempted to reach the patient regarding the most recent Inpatient/ED visit.  Follow Up Plan: Additional outreach attempts will be made to reach the patient to complete the Transitions of Care (Post Inpatient/ED visit) call.   Irving Shows [facility], BSN RN Care Manager/ Transition of Care [place]/ [facility] [phone]

## 2023-07-15 ENCOUNTER — Telehealth: Payer: Self-pay | Admitting: *Deleted

## 2023-07-15 ENCOUNTER — Other Ambulatory Visit: Payer: Self-pay | Admitting: Family Medicine

## 2023-07-15 ENCOUNTER — Ambulatory Visit: Payer: Medicare Other | Admitting: Physician Assistant

## 2023-07-15 DIAGNOSIS — F419 Anxiety disorder, unspecified: Secondary | ICD-10-CM

## 2023-07-15 DIAGNOSIS — E785 Hyperlipidemia, unspecified: Secondary | ICD-10-CM

## 2023-07-15 MED ORDER — ROSUVASTATIN CALCIUM 10 MG PO TABS: 10.0000 mg | ORAL_TABLET | Freq: Every day | ORAL | 0 refills | Status: DC

## 2023-07-15 MED ORDER — ALPRAZOLAM 0.5 MG PO TABS: ORAL_TABLET | ORAL | 0 refills | Status: DC

## 2023-07-15 NOTE — Transitions of Care (Post Inpatient/ED Visit) (Signed)
   07/15/2023  Name: [name] MRN: [id number] DOB: [birth year]  Today's TOC FU Call Status: Today's TOC FU Call Status:: Unsuccessful Call (2nd Attempt) Unsuccessful Call (2nd Attempt) Date: 07/15/23  Attempted to reach the patient regarding the most recent Inpatient/ED visit.  Follow Up Plan: Additional outreach attempts will be made to reach the patient to complete the Transitions of Care (Post Inpatient/ED visit) call.   Irving Shows [facility], BSN RN Care Manager/ Transition of Care [place]/ [facility] [phone]

## 2023-07-18 ENCOUNTER — Telehealth: Payer: Self-pay

## 2023-07-18 ENCOUNTER — Other Ambulatory Visit: Payer: Self-pay | Admitting: *Deleted

## 2023-07-18 DIAGNOSIS — I70221 Atherosclerosis of native arteries of extremities with rest pain, right leg: Secondary | ICD-10-CM

## 2023-07-18 DIAGNOSIS — T148XXA Other injury of unspecified body region, initial encounter: Secondary | ICD-10-CM

## 2023-07-18 NOTE — Progress Notes (Signed)
 [facility] Neurology Division Clinic Note - Progress Visit   Date: 07/19/23  [name] MRN: [id number] DOB: 07-12-1955  Reason for visit: Evaluation worsening headaches..  History of Present Illness:    Onset:  Any time of the day   Quality:  throbbing , feels tight, occasionally pulsating. During her hospitalization for stroke, she sustained some headaches  followed by L eye blurriness  Intensity:4-5/10    Location: B temporal across forehead and sometimes in the occipital area. Sometimes a "heartbeat in the face"  Duration: 20 mins Frequency: 2times a week. Associated symptoms: nausea  Vision changes?  Sees waves across her eyes, lasting 3-5 mins. Since her CVA, she sees "an outline on the corner of her L eye".  Aura: wiggly lines, every "now and then, maybe 2 times a month"  Activity Does home weekly yoga which helps sometimes Aggravating factors: Sounds and light Relieving factors: trying to calm herself down, breathe, and takes Tylenol and Xanax as needed  Current abortive medications: Tylenol once a week. Recently took Nurtec samples by her PCP, but insurance denied the Rx  Current prophylactic medications: "magnesium, B6, CoQ "  Past abortive medications: gabapentin (increased confusion, grogginess) Past prophylactic medications: Topamax "tired and no energy"    Family history: GF stroke, no family or personal history of headaches or seizures Smoker: no  Alcohol: no  Caffeine: none Sleep:"insomnia". Stress: Her brother and 2 grandkids, "It is fine now" her daughter reports that "it is time to move out now.    CTA 09/09/2021 personally reviewed, agree with radiology: 1. No emergent finding. 2. At least 70% atheromatous narrowing of the right common carotid bifurcation and right cavernous ICA. 50% stenosis of the paraclinoid right ICA. 3. 45% narrowing at the right vertebral origin. 4. Mild intracranial branch atherosclerosis.    CT ANGIO HEAD  NECK W WO CM 07/13/23  Result Date: 07/13/2023  Severe right intracranial ICA stenosis. 2. Severe right vertebral artery origin stenosis. 3. Approximately 60% stenosis of the right ICA origin. 4. Aortic Atherosclerosis   MRI of the brain 10/02/2021 no acute intracranial process. No evidence of acute or subacute infarct.     CT angio of the head 03/16/2023 was negative for acute findings.  No emergent LVO, unchanged multifocal severe stenosis of the right ICA cavernous segment and at least 70% stenosis of the right carotid bifurcation, unchanged mild narrowing of the right vertebral artery origin, it was essentially unchanged from prior in 2023. ESR was negative at 3.    MRI of the brain 07/11/2023 acute infarction in the right PCA territory affecting right occipital lobe, posteromedial temporal lobe and portion of the right hippocampus. No evidence of hemorrhage or mass effect. 2. Low moderate chronic small-vessel ischemic changes of the cerebral hemispheric white matter.         2 D echo 07/12/23  Left ventricular ejection fraction, by estimation, is 55 to 60%. The left ventricle has normal function. The left ventricle has no regional wall motion abnormalities. There is mild asymmetric left ventricular hypertrophy of the basal-septal segment. Left ventricular diastolic parameters were normal. The average left ventricular global longitudinal strain is -23.2 %. The global longitudinal strain is normal.  2. Right ventricular systolic function is normal. The right ventricular size is normal. Tricuspid regurgitation signal is inadequate for assessing PA pressure.  3. The mitral valve is grossly normal. No evidence of mitral valve regurgitation. No evidence of mitral stenosis.  4. The aortic valve is tricuspid. Aortic valve regurgitation  is not visualized. No aortic stenosis is present.  5. The inferior vena cava is normal in size with greater than 50% respiratory variability, suggesting right atrial pressure of 3  mmHg.   Initial visit 09/2021   Patient is a very pleasant 68 year old woman f initially seen at [facility] neurology for evaluation of essential tremor, currently on metoprolol,  history of hypertension, hyperlipidemia, anxiety, and a history of abdominal aortic aneurysm and prediabetes.  She recently had been seen at the ED on 10/02/2021 with complaints of significant headaches, "jumbled words ", and slight "left hand heaviness with the headaches ".  his last month, her blood pressure has been significantly uncontrolled reaching up to 200s.  She has some floaters in her right eye, otherwise she denies any visual complaints.  She has chronic bilateral foot tingling, but there is no new areas of numbness.  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  Also seen, 45% narrowing of the right vertebral origin.  Pending on these findings, an MRI of the brain without contrast was performed, negative for stroke or other abnormality is.  It was suspected that these symptoms were due to hypertensive emergency, which had resolved at the time of neurological evaluation.  She was discharged on gabapentin 300 mg twice daily, but the patient reduced it to 300 mg at night, because of increased sleepiness.  Patient is on statin, metoprolol, amlodipine, and clonidine as needed as well. Denies any history of TIA. Denies vertigo dizziness or vision changes. Denies dysphagia. No confusion Denies any fever or chills, or night sweats. No tobacco. No new meds or hormonal supplements. Does take a regular ASA a day, with no other antiplatelets or anticoagulants.Denies any recent long distance trips or recent surgeries. No sick contacts. No new stressors present in personal life.  Patient is compliant with his medications.Patient is active. She is here  in follow-up.  She is alone during this visit.  Out-side paper records, electronic medical record, and images have been reviewed where available and summarized as:  Lab Results  Component Value Date   HGBA1C 6.0 (H) 07/12/2023   Lab Results  Component Value Date   VITAMINB12 >2,000 (H) 01/20/2021   Lab Results  Component Value Date   TSH 1.84 10/29/2019   Lab Results  Component Value Date   ESRSEDRATE 3 03/16/2023    Past Medical History:  Diagnosis Date   Anxiety    Carotid stenosis, asymptomatic, bilateral    Cataract    Chronic left shoulder pain    Headache    Hiatal hernia    small   Hyperlipidemia    Hypertension    Osteoporosis    Pre-diabetes    Schatzki's ring    Seasonal allergies    Tremor     Past Surgical History:  Procedure Laterality Date   ABDOMINAL AORTOGRAM W/LOWER EXTREMITY N/A 07/11/2023   Procedure: ABDOMINAL AORTOGRAM W/LOWER EXTREMITY;  Surgeon: Maeola Harman, MD;  Location: [facility] INVASIVE CV LAB;  Service: Cardiovascular;  Laterality: N/A;   CARDIAC CATHETERIZATION  2005   "Ms. Postma has essentially normal coronary arteries and normal left ventricular function.  She will be treated empirically with anti-reflux measures"   CATARACT EXTRACTION, BILATERAL     CHOLECYSTECTOMY     COLONOSCOPY    06/16/2004   ZOX:WRUEAV rectum/Diminutive  polyps of the splenic flexure and the cecum/The remainder of the colonic mucosa appeared normal pathology (hyperplastic polyps).   COLONOSCOPY N/A 08/07/2015   Dr. Jena Gauss: 6 mm descending colon tubular adenoma, multiple medium-mouthed diverticula in sigmoid colon. surveillance 2022   COLONOSCOPY N/A 04/15/2021   Procedure: COLONOSCOPY;  Surgeon: Corbin Ade, MD;  Location: [facility];  Service: Endoscopy;  Laterality: N/A;  9:30am   ESOPHAGOGASTRODUODENOSCOPY  09/11/2001   NWG:NFAOZHYQM ring otherwise normal esophagus/Normal stomach/Normal D1 and D2 status post passage of a 56 French Maloney  dilator   ESOPHAGOGASTRODUODENOSCOPY N/A 11/11/2017   Procedure: ESOPHAGOGASTRODUODENOSCOPY (EGD);  Surgeon: Corbin Ade, MD; mild Schatzki ring s/p dilation and mild erosive gastropathy.   ESOPHAGOGASTRODUODENOSCOPY (EGD) WITH ESOPHAGEAL DILATION N/A 01/25/2013   Dr. Jena Gauss- schatzki's ring- 54 F dilation. small hiatal hernia   HYSTEROSCOPY WITH D & C N/A 03/06/2019   Procedure: DILATATION AND CURETTAGE /HYSTEROSCOPY;  Surgeon: Tilda Burrow, MD;  Location: [facility] ORS;  Service: Gynecology;  Laterality: N/A;   MALONEY DILATION N/A 11/11/2017   Procedure: Elease Hashimoto DILATION;  Surgeon: Corbin Ade, MD;  Location: [facility];  Service: Endoscopy;  Laterality: N/A;   PERIPHERAL VASCULAR BALLOON ANGIOPLASTY Left 07/11/2023   Procedure: PERIPHERAL VASCULAR BALLOON ANGIOPLASTY;  Surgeon: Maeola Harman, MD;  Location: [facility] INVASIVE CV LAB;  Service: Cardiovascular;  Laterality: Left;  External Iliac   PERIPHERAL VASCULAR INTERVENTION Bilateral 07/11/2023   Procedure: PERIPHERAL VASCULAR INTERVENTION;  Surgeon: Maeola Harman, MD;  Location: [facility] INVASIVE CV LAB;  Service: Cardiovascular;  Laterality: Bilateral;  Bilateral Common Iliac; L External Iliac   POLYPECTOMY N/A 03/06/2019   Procedure: POLYPECTOMY ( REMOVAL OF ENDOMETRIAL POLYP);  Surgeon: Tilda Burrow, MD;  Location: [facility];  Service: Gynecology;  Laterality: N/A;   POLYPECTOMY  04/15/2021   Procedure: POLYPECTOMY;  Surgeon: Corbin Ade, MD;  Location: [facility];  Service: Endoscopy;;   TUBAL LIGATION       Medications:  Outpatient Encounter Medications as of 07/19/2023  Medication Sig Note   acetaminophen (TYLENOL) 500 MG tablet Take 500 mg by mouth every 6 (six) hours as needed for moderate pain or headache.    alendronate (FOSAMAX) 70 MG tablet TAKE 1 TABLET(70 MG) BY MOUTH EVERY 7 DAYS WITH A FULL GLASS OF WATER AND ON AN EMPTY STOMACH    ALPRAZolam (XANAX) 0.5 MG tablet TAKE 1 TABLET(0.5 MG) BY MOUTH TWICE  DAILY AS NEEDED FOR ANXIETY    aspirin EC 81 MG tablet Take 81 mg by mouth at bedtime.     Calcium 500 MG tablet Take 500 mg by mouth in the morning and at bedtime.    carvedilol (COREG) 3.125 MG tablet TAKE 1 TABLET(3.125 MG) BY MOUTH TWICE DAILY    Cholecalciferol (VITAMIN D) 50 MCG (2000 UT) CAPS Take 2,000 Units by mouth daily.    clopidogrel (PLAVIX) 75 MG tablet Take 1 tablet (75 mg total) by mouth daily.    Coenzyme Q10 (COQ10) 100 MG CAPS Take 100 mg by mouth daily.    CRANBERRY PO Take 1 each by mouth 2 (two) times a week. Chewables    Cyanocobalamin (B-12) 2500 MCG TABS Take 2,500 mcg by mouth daily.    dexlansoprazole (DEXILANT) 60 MG capsule Take 1 capsule (60 mg total) by mouth daily.    EPINEPHrine 0.3 mg/0.3 mL IJ SOAJ injection Inject 0.3 mg into the muscle as needed for anaphylaxis. 07/11/2023: Never used as of 07/11/23.   fluticasone (FLONASE) 50 MCG/ACT  nasal spray Place 2 sprays into both nostrils daily as needed for allergies.    hydroquinone 4 % cream Apply 1 application topically 2 (two) times daily as needed (dark spots).    hydrOXYzine (VISTARIL) 25 MG capsule TAKE 1 CAPSULE(25 MG) BY MOUTH EVERY 8 HOURS AS NEEDED    ipratropium (ATROVENT) 0.03 % nasal spray INSTILL 2 SPRAYS IN EACH NOSTRIL EVERY 12 HOURS (Patient taking differently: Place 2 sprays into both nostrils 2 (two) times daily as needed for rhinitis.)    loratadine (CLARITIN) 10 MG tablet Take 10 mg by mouth daily as needed for allergies.    MAGNESIUM PO Take 330 mg by mouth daily.    Omega-3 Fatty Acids (FISH OIL) 1000 MG CAPS Take 1,000 mg by mouth 3 (three) times a week.    ondansetron (ZOFRAN) 4 MG tablet TAKE 1 TABLET(4 MG) BY MOUTH EVERY 8 HOURS AS NEEDED FOR NAUSEA OR VOMITING    Plecanatide (TRULANCE) 3 MG TABS Take 3 mg by mouth daily.    rosuvastatin (CRESTOR) 10 MG tablet Take 1 tablet (10 mg total) by mouth daily. (Patient taking differently: Take 20 mg by mouth daily.)    sodium chloride (OCEAN) 0.65 %  SOLN nasal spray Place 1 spray into both nostrils as needed for congestion.    triamcinolone cream (KENALOG) 0.1 % Apply 1 Application topically 2 (two) times daily. (Patient taking differently: Apply 1 Application topically 2 (two) times daily as needed (irritation).)    vitamin E 400 UNIT capsule Take 400 Units by mouth 3 (three) times a week.    zinc gluconate 50 MG tablet Take 50 mg by mouth 3 (three) times a week.    cilostazol (PLETAL) 50 MG tablet Take 1 tablet (50 mg total) by mouth 2 (two) times daily. (Patient not taking: Reported on 07/18/2023) 07/18/2023: Pt states she was advised by pharmacist when she picked up new med-Plavix that meds should not be taken   Facility-Administered Encounter Medications as of 07/19/2023  Medication   nitroGLYCERIN (NITROSTAT) SL tablet 0.4 mg    Allergies:  Allergies  Allergen Reactions   Ciprofloxacin Shortness Of Breath   Latex Other (See Comments)    Burns skin   Vitamin C [Bioflavonoid Products] Itching    Family History: Family History  Problem Relation Age of Onset   Diabetes Other    Diabetes Mother    Heart disease Mother    Heart disease Father    Diabetes Sister    Diabetes Brother    Hypertension Brother    Diabetes Daughter    Hypertension Maternal Grandmother    Stroke Maternal Grandfather    Early death Paternal Grandfather    Colon cancer Neg Hx    Liver disease Neg Hx     Social History: Social History   Tobacco Use   Smoking status: Former    Current packs/day: 0.00    Average packs/day: 0.3 packs/day for 35.0 years (8.8 ttl pk-yrs)    Types: Cigarettes    Start date: 12/31/1977    Quit date: 12/31/2012    Years since quitting: 10.5   Smokeless tobacco: Never  Vaping Use   Vaping status: Never Used  Substance Use Topics   Alcohol use: Not Currently   Drug use: No   Social History   Social History Narrative   Eats all food groups.    Wears seatbelt.    Has 3 children.   Prior smoker.   Attends  church.    Used to  work in Designer, fashion/clothing.    Drives.   Enjoys going out with family.    Right handed   One story home    Vital Signs:  BP (!) 187/74   Pulse 69   Resp 20   Wt 158 lb (71.7 kg)   SpO2 95%   BMI 24.75 kg/m    General Medical Exam:   General:  Well appearing, comfortable.   Eyes/ENT: see cranial nerve examination.   Neck: Bilateral soft carotid bruits. Respiratory:  Clear to auscultation, good air entry bilaterally.   Cardiac:  Regular rate and rhythm, soft murmur.   Extremities:  No deformities, edema, or skin discoloration.  Skin:  No rashes or lesions.  Neurological Exam: MENTAL STATUS including orientation to time, place, person, recent and remote memory, attention span and concentration, language, and fund of knowledge is normal.  Speech is not dysarthric.  CRANIAL NERVES: II:  No visual field defects.  Fundi not visualized III-IV-VI: Pupils equal round and reactive to light.  Normal conjugate, extra-ocular eye movements in all directions of gaze.  No nystagmus.  No ptosis.   V:  Normal facial sensation.    VII:  Normal facial symmetry and movements.   VIII:  Normal hearing and vestibular function.   IX-X:  Normal palatal movement.   XI:  Normal shoulder shrug and head rotation.   XII:  Normal tongue strength and range of motion, no deviation or fasciculation.  MOTOR:  No atrophy, fasciculations.  Left greater than right very mild tremor worse on intention, no head tremor.  No pronator drift. No cogwheeling   SENSORY:  Normal and symmetric perception of light touch, pinprick, vibration, and proprioception.  Romberg's sign absent.   COORDINATION/GAIT: Normal finger-to- nose-finger and heel-to-shin.  Intact rapid alternating movements bilaterally.  Able to rise from a chair without using arms.  Gait narrow based and stable. Tandem and stressed gait intact.    IMPRESSION/PLAN:  Headaches, concern for uncontrollable hypertension as the culprit.      For  abortive therapy May use Tylenol as needed.    Limit use of pain relievers to no more than 2 days out of week to prevent risk of rebound or medication-overuse headache. For preventative medicine: Patient was unable to tolerate nortritpyline due to worsening BP.  MRI brain to look for structural abnormalities and vascular load  Keep headache diary  Exercise, hydration, caffeine cessation, sleep hygiene, monitor for and avoid triggers Continue magnesium citrate 400mg  daily, riboflavin 400mg  daily, and coenzyme Q10 100mg  three times daily Strong monitor of Blood pressure with current med regimen  as per  Cards and PCP Continue Phenergan 25 mg every 8 hours as needed for nausea as per PCP. Follow up in 6 months   Follow-up with [facility] for carotid artery stenosis   Essential Tremors No significant changes from prior visit, denies any issues  She is not on metoprolol.    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   On 07/11/2023 the patient was admitted for PTCA of bilateral common femoral arteries and stent to bilateral common iliac arteries and one of the procedures.  At the time, she had been placed on conscious sedation.  While she was interactive with the staff postprocedure, she complained of bilateral visual blurring on the left temporal field with fluctuating blood pressure between 120s and 140s to 90,.  Code stroke was called, neurology evaluated patient.   CTA showed right ICA bulb 60%, and severe stenosis of siphon .  She was admitted to the hospital for further evaluation, neurology saw the patient in consultation.  Symptoms were felt most likely to have been secondary to recovery from sedation with transient hypotension possibly contributing. MRI of the brain was obtained and the findings were consistent with acute right PCA territory infarction affecting the right occipital lobe, posterior medial temporal lobe and portion of the right hippocampus.   There was no evidence of hemorrhage or mass effect.  There was low moderate chronic small vessel disease.   2D echo EF 55 to 60% LDL was 75 A1c was 6.0 VTE prophylaxis: She was placed on 5000 units of heparin every 8 hours, then followed by aspirin 81 mg daily (she was on baby aspirin prior to admission), and added Plavix 75 mg daily  Continue aspirin and Plavix, good control of her blood pressure, continue lipid medicines, and follow with cardiology, cardiovascular surgery       Total time spent:40 Mins   Thank you for allowing me to participate in patient's care.  If I can answer any additional questions, I would be pleased to do so.    Sincerely,   Marlowe Kays, PA-C

## 2023-07-18 NOTE — Telephone Encounter (Signed)
 Copied from CRM [id number]. Topic: Referral - Status >> Jul 18, 2023  1:32 PM Franchot Heidelberg wrote: Reason for CRM: Pt wants a new neurology referral

## 2023-07-18 NOTE — Progress Notes (Signed)
 Patient daughter called and states her Mom has a lump in her groin area at incision sight. Her procedure was done on 07/11/23. Scheduled patient to be seen by PA tomorrow with [place].

## 2023-07-18 NOTE — Patient Outreach (Signed)
 [facility]i Stroke Care Coordination Follow Up  07/18/2023 Name:  [name] MRN:  [id number] DOB:  Aug 29, 1955  Subjective: [name] is a 68 y.o. year old female who is a primary care patient of [name], MD An Emmi alert was received indicating patient responded to questions: Questions/problems with meds?. I reached out by phone to follow up on the alert and spoke to Patient. Spoke with patient who voices she is doing well. She is currently out of town and staring with her daughter while she recovers. Reviewed and addressed red alert. Patient voices that when she went to pharmacy to pick up new med-Plavix she was advised by the pharmacist that she should not be taking Plavix and Pletal as they "pretty much do the same thing'. Since discharge home she has only been taking Plavix. RN CM did three-way conference call to Dr. Darcella Cheshire [phone]) asking for clarification on rather or not pt should be taking both meds.Left message on nurse triage line. Patient aware to follow up with provider office if no response within 24hrs. She confirms she has all her other meds and taking them as ordered. Appetite remains fair-eating small portions but this is normal for her. She is sleeping fairly well. She is up walking and moving around. Pt has not made PCP follow up appt and discussed importance of making appt once she returns in town. She has neuro and vein & vascular appts in place and denies any issues with transportation.  Care Coordination Interventions:  Yes, provided    Interventions Today    Flowsheet Row Most Recent Value  Chronic Disease   Chronic disease during today's visit Other, Hypertension (HTN)  [post stroke mgmt]  General Interventions   General Interventions Discussed/Reviewed General Interventions Discussed, Doctor Visits  Doctor Visits Discussed/Reviewed Doctor Visits Discussed, PCP, Specialist  PCP/Specialist Visits Compliance with follow-up visit  Education  Interventions   Education Provided Provided Education  Provided Verbal Education On Nutrition, When to see the doctor, Medication  Nutrition Interventions   Nutrition Discussed/Reviewed Nutrition Discussed  Pharmacy Interventions   Pharmacy Dicussed/Reviewed Pharmacy Topics Discussed, Medications and their functions  Safety Interventions   Safety Discussed/Reviewed Safety Discussed        TOC Interventions Today    Flowsheet Row Most Recent Value  TOC Interventions   TOC Interventions Discussed/Reviewed TOC Interventions Discussed       Follow up plan:  Patient advised that she will continue to get automated post discharge EMMI-Stroke calls and an RN CM will follow up for any abnormal responses. She voiced understanding and appreciation.    Encounter Outcome:  Patient Visit Completed     Antionette Fairy, RN,BSN, CCM [facility]  VBCI-Population Health Manager Population Health Direct Dial: [phone]

## 2023-07-19 ENCOUNTER — Ambulatory Visit (HOSPITAL_COMMUNITY)
Admission: RE | Admit: 2023-07-19 | Discharge: 2023-07-19 | Disposition: A | Discharge status: Home / Self Care | Source: Ambulatory Visit | Attending: Physician Assistant | Admitting: Physician Assistant

## 2023-07-19 ENCOUNTER — Ambulatory Visit

## 2023-07-19 ENCOUNTER — Encounter: Payer: Self-pay | Admitting: Physician Assistant

## 2023-07-19 ENCOUNTER — Other Ambulatory Visit (HOSPITAL_COMMUNITY): Payer: Self-pay

## 2023-07-19 ENCOUNTER — Ambulatory Visit: Admitting: Physician Assistant

## 2023-07-19 VITALS — BP 187/74 | HR 69 | Resp 20 | Wt 158.0 lb

## 2023-07-19 VITALS — BP 157/83 | HR 72 | Temp 98.1°F | Ht 67.0 in | Wt 157.1 lb

## 2023-07-19 DIAGNOSIS — I70221 Atherosclerosis of native arteries of extremities with rest pain, right leg: Secondary | ICD-10-CM | POA: Diagnosis not present

## 2023-07-19 DIAGNOSIS — T148XXA Other injury of unspecified body region, initial encounter: Secondary | ICD-10-CM | POA: Insufficient documentation

## 2023-07-19 DIAGNOSIS — G44229 Chronic tension-type headache, not intractable: Secondary | ICD-10-CM | POA: Diagnosis not present

## 2023-07-19 DIAGNOSIS — I639 Cerebral infarction, unspecified: Secondary | ICD-10-CM | POA: Insufficient documentation

## 2023-07-19 MED ORDER — APIXABAN (ELIQUIS) VTE STARTER PACK (10MG AND 5MG)
ORAL_TABLET | ORAL | 0 refills | Status: DC
  Filled 2023-07-19: qty 74, 30d supply, fill #0

## 2023-07-19 MED ORDER — APIXABAN 5 MG PO TABS
5.0000 mg | ORAL_TABLET | Freq: Two times a day (BID) | ORAL | 1 refills | Status: DC
  Filled 2023-07-19: qty 60, 30d supply, fill #0

## 2023-07-19 NOTE — Progress Notes (Signed)
 Office Note     CC:  follow up Requesting Provider:  [name], MD  HPI: [name] is a 68 y.o. (December 01, 1955) female who presents status post aortogram with stenting of bilateral common iliac arteries with stenting of the left external iliac artery by Dr. Randie Heinz on 07/11/2023 due to right leg rest pain.  Patient states overall she is doing well.  She no longer is experiencing rest pain in her right foot.  She occasionally gets tingling feeling in her toes however this is tolerable.  She unfortunately is concerned about a knot in her left groin that she has had since surgery.  She is accompanied today by her daughter who is an ICU nurse in [place].  She also is complaining of left lower extremity edema since her procedure.  She is on   Past Medical History:  Diagnosis Date   Anxiety    Carotid stenosis, asymptomatic, bilateral    Cataract    Chronic left shoulder pain    Headache    Hiatal hernia    small   Hyperlipidemia    Hypertension    Osteoporosis    Pre-diabetes    Schatzki's ring    Seasonal allergies    Tremor     Past Surgical History:  Procedure Laterality Date   ABDOMINAL AORTOGRAM W/LOWER EXTREMITY N/A 07/11/2023   Procedure: ABDOMINAL AORTOGRAM W/LOWER EXTREMITY;  Surgeon: Maeola Harman, MD;  Location: [facility] INVASIVE CV LAB;  Service: Cardiovascular;  Laterality: N/A;   CARDIAC CATHETERIZATION  2005   "Ms. Manard has essentially normal coronary arteries and normal left ventricular function.  She will be treated empirically with anti-reflux measures"   CATARACT EXTRACTION, BILATERAL     CHOLECYSTECTOMY     COLONOSCOPY    06/16/2004   BJY:NWGNFA rectum/Diminutive polyps of the splenic flexure and the cecum/The remainder of the colonic mucosa appeared normal pathology (hyperplastic polyps).   COLONOSCOPY N/A 08/07/2015   Dr. Jena Gauss: 6 mm descending colon tubular adenoma, multiple medium-mouthed diverticula in sigmoid colon. surveillance 2022    COLONOSCOPY N/A 04/15/2021   Procedure: COLONOSCOPY;  Surgeon: Corbin Ade, MD;  Location: [facility];  Service: Endoscopy;  Laterality: N/A;  9:30am   ESOPHAGOGASTRODUODENOSCOPY  09/11/2001   OZH:YQMVHQION ring otherwise normal esophagus/Normal stomach/Normal D1 and D2 status post passage of a 56 French Maloney dilator   ESOPHAGOGASTRODUODENOSCOPY N/A 11/11/2017   Procedure: ESOPHAGOGASTRODUODENOSCOPY (EGD);  Surgeon: Corbin Ade, MD; mild Schatzki ring s/p dilation and mild erosive gastropathy.   ESOPHAGOGASTRODUODENOSCOPY (EGD) WITH ESOPHAGEAL DILATION N/A 01/25/2013   Dr. Jena Gauss- schatzki's ring- 54 F dilation. small hiatal hernia   HYSTEROSCOPY WITH D & C N/A 03/06/2019   Procedure: DILATATION AND CURETTAGE /HYSTEROSCOPY;  Surgeon: Tilda Burrow, MD;  Location: [facility];  Service: Gynecology;  Laterality: N/A;   MALONEY DILATION N/A 11/11/2017   Procedure: Elease Hashimoto DILATION;  Surgeon: Corbin Ade, MD;  Location: [facility];  Service: Endoscopy;  Laterality: N/A;   PERIPHERAL VASCULAR BALLOON ANGIOPLASTY Left 07/11/2023   Procedure: PERIPHERAL VASCULAR BALLOON ANGIOPLASTY;  Surgeon: Maeola Harman, MD;  Location: [facility] INVASIVE CV LAB;  Service: Cardiovascular;  Laterality: Left;  External Iliac   PERIPHERAL VASCULAR INTERVENTION Bilateral 07/11/2023   Procedure: PERIPHERAL VASCULAR INTERVENTION;  Surgeon: Maeola Harman, MD;  Location: [facility] INVASIVE CV LAB;  Service: Cardiovascular;  Laterality: Bilateral;  Bilateral Common Iliac; L External Iliac   POLYPECTOMY N/A 03/06/2019   Procedure: POLYPECTOMY ( REMOVAL OF ENDOMETRIAL POLYP);  Surgeon:  Tilda Burrow, MD;  Location: [facility];  Service: Gynecology;  Laterality: N/A;   POLYPECTOMY  04/15/2021   Procedure: POLYPECTOMY;  Surgeon: Corbin Ade, MD;  Location: [facility];  Service: Endoscopy;;   TUBAL LIGATION      Social History   Socioeconomic History   Marital status: Single    Spouse name: Not on file    Number of children: 3   Years of education: Not on file   Highest education level: 10th grade  Occupational History   Occupation: Public affairs consultant: [employer]  Tobacco Use   Smoking status: Former    Current packs/day: 0.00    Average packs/day: 0.3 packs/day for 35.0 years (8.8 ttl pk-yrs)    Types: Cigarettes    Start date: 12/31/1977    Quit date: 12/31/2012    Years since quitting: 10.5   Smokeless tobacco: Never  Vaping Use   Vaping status: Never Used  Substance and Sexual Activity   Alcohol use: Not Currently   Drug use: No   Sexual activity: Not Currently    Partners: Male    Birth control/protection: Post-menopausal, Surgical    Comment: tubal  Other Topics Concern   Not on file  Social History Narrative   Eats all food groups.    Wears seatbelt.    Has 3 children.   Prior smoker.   Attends church.    Used to work in Designer, fashion/clothing.    Drives.   Enjoys going out with family.    Right handed   One story home   Social Drivers of Health   Financial Resource Strain: Low Risk  (06/02/2023)   Overall Financial Resource Strain (CARDIA)    Difficulty of Paying Living Expenses: Not very hard  Food Insecurity: No Food Insecurity (07/18/2023)   Hunger Vital Sign    Worried About Running Out of Food in the Last Year: Never true    Ran Out of Food in the Last Year: Never true  Transportation Needs: No Transportation Needs (07/18/2023)   PRAPARE - Administrator, Civil Service (Medical): No    Lack of Transportation (Non-Medical): No  Physical Activity: Sufficiently Active (06/02/2023)   Exercise Vital Sign    Days of Exercise per Week: 2 days    Minutes of Exercise per Session: 90 min  Recent Concern: Physical Activity - Insufficiently Active (03/18/2023)   Exercise Vital Sign    Days of Exercise per Week: 3 days    Minutes of Exercise per Session: 30 min  Stress: Stress Concern Present (06/02/2023)   [employer] of Occupational Health -  Occupational Stress Questionnaire    Feeling of Stress : To some extent  Social Connections: Moderately Integrated (07/12/2023)   Social Connection and Isolation Panel [NHANES]    Frequency of Communication with Friends and Family: More than three times a week    Frequency of Social Gatherings with Friends and Family: More than three times a week    Attends Religious Services: More than 4 times per year    Active Member of [employer] or Organizations: Yes    Attends Banker Meetings: 1 to 4 times per year    Marital Status: Divorced  Intimate Partner Violence: Not At Risk (07/18/2023)   Humiliation, Afraid, Rape, and Kick questionnaire    Fear of Current or Ex-Partner: No    Emotionally Abused: No    Physically Abused: No    Sexually Abused: No  Family History  Problem Relation Age of Onset   Diabetes Other    Diabetes Mother    Heart disease Mother    Heart disease Father    Diabetes Sister    Diabetes Brother    Hypertension Brother    Diabetes Daughter    Hypertension Maternal Grandmother    Stroke Maternal Grandfather    Early death Paternal Grandfather    Colon cancer Neg Hx    Liver disease Neg Hx     Current Outpatient Medications  Medication Sig Dispense Refill   acetaminophen (TYLENOL) 500 MG tablet Take 500 mg by mouth every 6 (six) hours as needed for moderate pain or headache.     alendronate (FOSAMAX) 70 MG tablet TAKE 1 TABLET(70 MG) BY MOUTH EVERY 7 DAYS WITH A FULL GLASS OF WATER AND ON AN EMPTY STOMACH 4 tablet 11   ALPRAZolam (XANAX) 0.5 MG tablet TAKE 1 TABLET(0.5 MG) BY MOUTH TWICE DAILY AS NEEDED FOR ANXIETY 180 tablet 0   [START ON 08/19/2023] apixaban (ELIQUIS) 5 MG TABS tablet Take 1 tablet (5 mg total) by mouth 2 (two) times daily. 60 tablet 1   APIXABAN (ELIQUIS) VTE STARTER PACK (10MG  AND 5MG ) Take 10 mg by mouth 2 (two) times daily for 7 days, THEN 5 mg 2 (two) times daily for 21 days. 74 tablet 0   aspirin EC 81 MG tablet Take 81 mg by  mouth at bedtime.      Calcium 500 MG tablet Take 500 mg by mouth in the morning and at bedtime.     carvedilol (COREG) 3.125 MG tablet TAKE 1 TABLET(3.125 MG) BY MOUTH TWICE DAILY 180 tablet 0   Cholecalciferol (VITAMIN D) 50 MCG (2000 UT) CAPS Take 2,000 Units by mouth daily.     clopidogrel (PLAVIX) 75 MG tablet Take 1 tablet (75 mg total) by mouth daily. 30 tablet 11   Coenzyme Q10 (COQ10) 100 MG CAPS Take 100 mg by mouth daily.     CRANBERRY PO Take 1 each by mouth 2 (two) times a week. Chewables     Cyanocobalamin (B-12) 2500 MCG TABS Take 2,500 mcg by mouth daily.     dexlansoprazole (DEXILANT) 60 MG capsule Take 1 capsule (60 mg total) by mouth daily. 30 capsule 11   EPINEPHrine 0.3 mg/0.3 mL IJ SOAJ injection Inject 0.3 mg into the muscle as needed for anaphylaxis. 1 each 1   fluticasone (FLONASE) 50 MCG/ACT nasal spray Place 2 sprays into both nostrils daily as needed for allergies.     hydroquinone 4 % cream Apply 1 application topically 2 (two) times daily as needed (dark spots).     hydrOXYzine (VISTARIL) 25 MG capsule TAKE 1 CAPSULE(25 MG) BY MOUTH EVERY 8 HOURS AS NEEDED 90 capsule 2   ipratropium (ATROVENT) 0.03 % nasal spray INSTILL 2 SPRAYS IN EACH NOSTRIL EVERY 12 HOURS (Patient taking differently: Place 2 sprays into both nostrils 2 (two) times daily as needed for rhinitis.) 30 mL 12   loratadine (CLARITIN) 10 MG tablet Take 10 mg by mouth daily as needed for allergies.     MAGNESIUM PO Take 330 mg by mouth daily.     Omega-3 Fatty Acids (FISH OIL) 1000 MG CAPS Take 1,000 mg by mouth 3 (three) times a week.     ondansetron (ZOFRAN) 4 MG tablet TAKE 1 TABLET(4 MG) BY MOUTH EVERY 8 HOURS AS NEEDED FOR NAUSEA OR VOMITING 30 tablet 0   Plecanatide (TRULANCE) 3 MG TABS Take 3 mg  by mouth daily. 30 tablet 11   rosuvastatin (CRESTOR) 10 MG tablet Take 1 tablet (10 mg total) by mouth daily. (Patient taking differently: Take 20 mg by mouth daily.) 90 tablet 0   sodium chloride (OCEAN)  0.65 % SOLN nasal spray Place 1 spray into both nostrils as needed for congestion.     triamcinolone cream (KENALOG) 0.1 % Apply 1 Application topically 2 (two) times daily. (Patient taking differently: Apply 1 Application topically 2 (two) times daily as needed (irritation).) 30 g 0   vitamin E 400 UNIT capsule Take 400 Units by mouth 3 (three) times a week.     zinc gluconate 50 MG tablet Take 50 mg by mouth 3 (three) times a week.     Current Facility-Administered Medications  Medication Dose Route Frequency Provider Last Rate Last Admin   nitroGLYCERIN (NITROSTAT) SL tablet 0.4 mg  0.4 mg Sublingual Once         Allergies  Allergen Reactions   Ciprofloxacin Shortness Of Breath   Latex Other (See Comments)    Burns skin   Vitamin C [Bioflavonoid Products] Itching     REVIEW OF SYSTEMS:   [X]  denotes positive finding, [ ]  denotes negative finding Cardiac  Comments:  Chest pain or chest pressure:    Shortness of breath upon exertion:    Short of breath when lying flat:    Irregular heart rhythm:        Vascular    Pain in calf, thigh, or hip brought on by ambulation:    Pain in feet at night that wakes you up from your sleep:     Blood clot in your veins:    Leg swelling:         Pulmonary    Oxygen at home:    Productive cough:     Wheezing:         Neurologic    Sudden weakness in arms or legs:     Sudden numbness in arms or legs:     Sudden onset of difficulty speaking or slurred speech:    Temporary loss of vision in one eye:     Problems with dizziness:         Gastrointestinal    Blood in stool:     Vomited blood:         Genitourinary    Burning when urinating:     Blood in urine:        Psychiatric    Major depression:         Hematologic    Bleeding problems:    Problems with blood clotting too easily:        Skin    Rashes or ulcers:        Constitutional    Fever or chills:      PHYSICAL EXAMINATION:  Vitals:   07/19/23 1223  BP: (!)  157/83  Pulse: 72  Temp: 98.1 F (36.7 C)  TempSrc: Temporal  SpO2: 98%  Weight: 157 lb 1.6 oz (71.3 kg)  Height: 5\' 7"  (1.702 m)    General:  WDWN in NAD; vital signs documented above Gait: Not observed HENT: WNL, normocephalic Pulmonary: normal non-labored breathing , without Rales, rhonchi,  wheezing Cardiac: regular HR Abdomen: soft, NT, no masses Skin: without rashes Vascular Exam/Pulses: Palpable left DP pulse; right peroneal and DP brisk by Doppler Extremities: Palpable small firm hematoma left groin; edematous left lower extremity Musculoskeletal: no muscle wasting or atrophy  Neurologic: A&O X  3 Psychiatric:  The pt has Normal affect.   Non-Invasive Vascular Imaging:   Pseudo duplex negative for pseudoaneurysm however incidental finding of DVT involving the left distal common femoral vein, proximal femoral vein, and saphenofemoral junction    ASSESSMENT/PLAN:: 68 y.o. female status post bilateral common iliac artery stenting and left external iliac artery stenting  Ms. Myre is a 68 year old female who presents as an urgent add-on due to left lower extremity edema and a firm knot in her left groin.  She underwent aortogram with bilateral common iliac artery stenting with focus on right leg runoff.  This was performed via the left common femoral artery.  Duplex was negative for pseudoaneurysm however she does have a focal DVT involving the proximal femoral vein and distal common femoral vein.  Based on exam DVT may have been provoked by hematoma in the left groin.  She will be started on Eliquis for at least 3 months.  She will keep her follow-up appointment with Dr. Randie Heinz next month to review imaging and discuss potential bypass surgery of the right leg.  Fortunately her rest pain has resolved in the right foot and she only complains of some tingling in the toes occasionally.  Plan was discussed with her daughter who was present today and she is also agreeable.  Eliquis  starter pack and maintenance dosage was sent to [facility] outpatient pharmacy to be picked up this afternoon.   Emilie Rutter, PA-C [facility] [phone]  Clinic MD:   Chestine Spore

## 2023-07-19 NOTE — Patient Instructions (Signed)
  Follow up in 6 months  Continue the CoQ, B6 and Mag and tylenol  Continue Nurtec as needed as directed for headaches  Recommend good stress management and sleep  Stay hydrated  Follow with cardiology  and [facility]  for murmurs, carotid bruits, blood pressure. Continue ASA and Plavix    Limit use of pain relievers to no more than 2 days out of the week.  These medications include acetaminophen, NSAIDs (ibuprofen/Advil/Motrin, naproxen/Aleve, triptans (Imitrex/sumatriptan), Excedrin, and narcotics.  This will help reduce risk of rebound headaches. Be aware of common food triggers:  - Caffeine:  coffee, black tea, cola, Mt. Dew  - Chocolate  - Dairy:  aged cheeses (brie, blue, cheddar, gouda, [place], provolone, Cascade, Swiss, etc), chocolate milk, buttermilk, sour cream, limit eggs and yogurt  - Nuts, peanut butter  - Alcohol  - Cereals/grains:  FRESH breads (fresh bagels, sourdough, doughnuts), yeast productions  - Processed/canned/aged/cured meats (pre-packaged deli meats, hotdogs)  - MSG/glutamate:  soy sauce, flavor enhancer, pickled/preserved/marinated foods  - Sweeteners:  aspartame (Equal, Nutrasweet).  Sugar and Splenda are okay  - Vegetables:  legumes (lima beans, lentils, snow peas, fava beans, pinto peans, peas, garbanzo beans), sauerkraut, onions, olives, pickles  - Fruit:  avocados, bananas, citrus fruit (orange, lemon, grapefruit), mango  - Other:  Frozen meals, macaroni and cheese Routine exercise Stay adequately hydrated (aim for 64 oz water daily) Keep headache diary Maintain proper stress management Maintain proper sleep hygiene Do not skip meals Consider supplements:  magnesium citrate 400mg  daily, riboflavin 400mg  daily, coenzyme Q10 100mg  three times daily. 50 25   MRI will be at [facility] [phone]

## 2023-07-20 DIAGNOSIS — H53462 Homonymous bilateral field defects, left side: Secondary | ICD-10-CM | POA: Diagnosis not present

## 2023-07-22 ENCOUNTER — Ambulatory Visit (INDEPENDENT_AMBULATORY_CARE_PROVIDER_SITE_OTHER): Admitting: Family Medicine

## 2023-07-22 ENCOUNTER — Encounter: Payer: Self-pay | Admitting: Family Medicine

## 2023-07-22 VITALS — BP 156/74 | HR 75 | Temp 98.2°F | Ht 67.0 in | Wt 161.0 lb

## 2023-07-22 DIAGNOSIS — I10 Essential (primary) hypertension: Secondary | ICD-10-CM

## 2023-07-22 DIAGNOSIS — I824Y2 Acute embolism and thrombosis of unspecified deep veins of left proximal lower extremity: Secondary | ICD-10-CM

## 2023-07-22 DIAGNOSIS — I639 Cerebral infarction, unspecified: Secondary | ICD-10-CM

## 2023-07-22 DIAGNOSIS — I739 Peripheral vascular disease, unspecified: Secondary | ICD-10-CM

## 2023-07-22 MED ORDER — ROSUVASTATIN CALCIUM 20 MG PO TABS: 20.0000 mg | ORAL_TABLET | Freq: Every day | ORAL | 3 refills | Status: DC

## 2023-07-22 MED ORDER — APIXABAN 5 MG PO TABS: 5.0000 mg | ORAL_TABLET | Freq: Two times a day (BID) | ORAL | 1 refills | Status: DC

## 2023-07-22 NOTE — Progress Notes (Signed)
 Subjective:    Patient ID: [name], female    DOB: 1955/10/29, 68 y.o.   MRN: [id number]  Admission Date: 07/11/2023   Discharge Date: 07/13/2023   Physician: [name]*   Admission Diagnosis: PAD (peripheral artery disease) (HCC) [I73.9]     HPI:   This is a 68 y.o. female  history of carotid artery disease and also life limiting right greater than left lower extremity claudication.  She was lastevaluated here in October and now here follows up for further evaluation.  She does not have any tissue loss or ulceration but she states that she does have significant pain in her right lower extremity particularly when walking and at night and she states there are no alleviating factors.  She is a former smoker quit over 10 years ago.  She remains on aspirin and a statin.    Hospital Course:  The patient was admitted to the hospital and taken to the [facility] lab on 07/11/2023 and underwent: 1.  Percutaneous ultrasound-guided access and Mynx device closure bilateral common femoral arteries 2.  Catheter in aorta and aortogram 3.  Bilateral lower extremity angiography 4.  Catheter selection right common femoral artery from left common femoral approach 5.  Stent of bilateral common iliac arteries with 7 x 39 mm VBX and stent of left external iliac artery with 7 x 60 mm Eluvia 6.  Moderate sedation with fentanyl and Versed for 51 minutes     Findings as follows: The proximal aorta was patent with bilateral renal arteries patent.  The distal terminal aorta was heavily calcified there was a subtotal occlusion of the right common iliac artery with heavily diseased greater than 80% stenosis of the left common iliac artery extending down into the left external iliac artery.  The right lower extremity common femoral was patent the SFA is initially diseased and occludes for approximately 20 cm and reconstitutes above the knee with three-vessel runoff dominant via the ankle.  On the left  side the common femoral artery is similarly nondiseased and there is a healthy appearing profunda and left SFA in the lower aspect of the left lower extremity was not evaluated.  After stenting the right common iliac artery there was 0% residual stenosis where previously was subtotally occluded and there is no gradient between the aorta and the external iliac artery with a patent hypogastric.  On the left side after stenting of the left common iliac artery we then extended for persistent gradient of 50 mmHg down to the left external iliac artery with the noncovered drug-eluting stent postdilated with a 7 mm balloon and then there was no gradient from the aorta to the distal external iliac artery.   Patient will be reevaluated in 4 to 6 weeks in our office and if she has persistent right lower extremity symptoms would be considered for right common femoral to above-knee popliteal artery bypass with vein versus more likely PTFE.   The pt tolerated the procedure well and was transported to the PACU in good condition.  While in the recovery room, she started having a visual disturbance.  She was evaluated by Dr. Karin Williams and pt admitted for stroke work up.     Neurology was consulted.   Neuro recommendations: - MRI brain  IMPRESSION: 1. 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 No evidence of hemorrhage or mass effect. 2. Low moderate chronic small-vessel ischemic changes of the  cerebral hemispheric white matter. - Frequent neuro checks - Antiplatelet medication per Vascular Surgery - Hydrate well and avoid hypotension   She was continued on asa/plavix/statin.    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  Neuro recommended avoiding low blood pressure given right ICA high grade stenosis at siphon and 60% at the bulb.   Her Avapro, Norvasc and spironolactone held at discharge.   Her crestor was increased to 20mg  daily.     She was discharged home.  07/22/23 Since discharge home, the patient developed swelling in her left leg.  She was seen by her vascular surgeon and the ultrasound ruled out an aneurysm but did confirm a left leg DVT.  She is currently on Eliquis along with her dual antiplatelet therapy.  She denies any bleeding or bruising.  She denies any melena or hematochezia.  She denies any hemoptysis.  At the present time she is taking aspirin, Plavix, and Eliquis.  Blood pressure at home has been between 140 and 150 systolic.  She is only taking carvedilol Past Medical History:  Diagnosis Date   Anxiety    Carotid stenosis, asymptomatic, bilateral    Cataract    Chronic left shoulder pain    Headache    Hiatal hernia    small   Hyperlipidemia    Hypertension    Osteoporosis    Pre-diabetes    Schatzki's ring    Seasonal allergies    Tremor      Past Surgical History:  Procedure Laterality Date   ABDOMINAL AORTOGRAM W/LOWER EXTREMITY N/A 07/11/2023   Procedure: ABDOMINAL AORTOGRAM W/LOWER EXTREMITY;  Surgeon: Kimberly Harman, MD;  Location: [facility] LAB;  Service: Cardiovascular;  Laterality: N/A;   CARDIAC CATHETERIZATION  2005   "[name] has essentially normal coronary arteries and normal left ventricular function.  She will be treated empirically with anti-reflux measures"   CATARACT EXTRACTION, BILATERAL     CHOLECYSTECTOMY     COLONOSCOPY    06/16/2004   ZOX:WRUEAV rectum/Diminutive polyps of the splenic flexure and the cecum/The remainder of the colonic mucosa appeared normal pathology (hyperplastic polyps).   COLONOSCOPY N/A 08/07/2015   Dr. Jena Williams: 6 mm descending colon tubular adenoma, multiple medium-mouthed diverticula in sigmoid colon. surveillance 2022   COLONOSCOPY N/A 04/15/2021   Procedure: COLONOSCOPY;  Surgeon: Kimberly Ade, MD;  Location: [facility];  Service: Endoscopy;  Laterality: N/A;  9:30am    ESOPHAGOGASTRODUODENOSCOPY  09/11/2001   WUJ:[id number] ring otherwise normal esophagus/Normal stomach/Normal D1 and D2 status post passage of a 56 French Maloney dilator   ESOPHAGOGASTRODUODENOSCOPY N/A 11/11/2017   Procedure: ESOPHAGOGASTRODUODENOSCOPY (EGD);  Surgeon: Kimberly Ade, MD; mild Schatzki ring s/p dilation and mild erosive gastropathy.   ESOPHAGOGASTRODUODENOSCOPY (EGD) WITH ESOPHAGEAL DILATION N/A 01/25/2013   Dr. Jena Williams- schatzki's ring- 54 F dilation. small hiatal hernia   HYSTEROSCOPY WITH D & C N/A 03/06/2019   Procedure: DILATATION AND CURETTAGE /HYSTEROSCOPY;  Surgeon: Tilda Burrow, MD;  Location: [facility];  Service: Gynecology;  Laterality: N/A;   MALONEY DILATION N/A 11/11/2017   Procedure: Elease Hashimoto DILATION;  Surgeon: Kimberly Ade, MD;  Location: [facility];  Service: Endoscopy;  Laterality: N/A;   PERIPHERAL VASCULAR BALLOON ANGIOPLASTY Left 07/11/2023   Procedure: PERIPHERAL VASCULAR BALLOON ANGIOPLASTY;  Surgeon: Kimberly Harman, MD;  Location: [facility] INVASIVE CV LAB;  Service: Cardiovascular;  Laterality: Left;  External Iliac   PERIPHERAL VASCULAR INTERVENTION Bilateral 07/11/2023   Procedure: PERIPHERAL VASCULAR INTERVENTION;  Surgeon: Kimberly Harman, MD;  Location: [facility] INVASIVE CV LAB;  Service: Cardiovascular;  Laterality: Bilateral;  Bilateral Common Iliac; L External Iliac   POLYPECTOMY N/A 03/06/2019   Procedure: POLYPECTOMY ( REMOVAL OF ENDOMETRIAL POLYP);  Surgeon: Tilda Burrow, MD;  Location: [facility];  Service: Gynecology;  Laterality: N/A;   POLYPECTOMY  04/15/2021   Procedure: POLYPECTOMY;  Surgeon: Kimberly Ade, MD;  Location: [facility];  Service: Endoscopy;;   TUBAL LIGATION         Allergies  Allergen Reactions   Ciprofloxacin Shortness Of Breath   Latex Other (See Comments)    Burns skin   Vitamin C [Bioflavonoid Products] Itching   Social History   Socioeconomic History   Marital status: Single    Spouse name: Not  on file   Number of children: 3   Years of education: Not on file   Highest education level: 10th grade  Occupational History   Occupation: Public affairs consultant: [employer]  Tobacco Use   Smoking status: Former    Current packs/day: 0.00    Average packs/day: 0.3 packs/day for 35.0 years (8.8 ttl pk-yrs)    Types: Cigarettes    Start date: 12/31/1977    Quit date: 12/31/2012    Years since quitting: 10.5   Smokeless tobacco: Never  Vaping Use   Vaping status: Never Used  Substance and Sexual Activity   Alcohol use: Not Currently   Drug use: No   Sexual activity: Not Currently    Partners: Male    Birth control/protection: Post-menopausal, Surgical    Comment: tubal  Other Topics Concern   Not on file  Social History Narrative   Eats all food groups.    Wears seatbelt.    Has 3 children.   Prior smoker.   Attends church.    Used to work in Designer, fashion/clothing.    Drives.   Enjoys going out with family.    Right handed   One story home   Social Drivers of Health   Financial Resource Strain: Low Risk  (06/02/2023)   Overall Financial Resource Strain (CARDIA)    Difficulty of Paying Living Expenses: Not very hard  Food Insecurity: No Food Insecurity (07/18/2023)   Hunger Vital Sign    Worried About Running Out of Food in the Last Year: Never true    Ran Out of Food in the Last Year: Never true  Transportation Needs: No Transportation Needs (07/18/2023)   PRAPARE - Administrator, Civil Service (Medical): No    Lack of Transportation (Non-Medical): No  Physical Activity: Sufficiently Active (06/02/2023)   Exercise Vital Sign    Days of Exercise per Week: 2 days    Minutes of Exercise per Session: 90 min  Recent Concern: Physical Activity - Insufficiently Active (03/18/2023)   Exercise Vital Sign    Days of Exercise per Week: 3 days    Minutes of Exercise per Session: 30 min  Stress: Stress Concern Present (06/02/2023)   [employer] of Occupational  Health - Occupational Stress Questionnaire    Feeling of Stress : To some extent  Social Connections: Moderately Integrated (07/12/2023)   Social Connection and Isolation Panel [NHANES]    Frequency of Communication with Friends and Family: More than three times a week    Frequency of Social Gatherings with Friends and Family: More than three times a week    Attends Religious Services: More than 4 times per year  Active Member of Clubs or Organizations: Yes    Attends Banker Meetings: 1 to 4 times per year    Marital Status: Divorced  Intimate Partner Violence: Not At Risk (07/18/2023)   Humiliation, Afraid, Rape, and Kick questionnaire    Fear of Current or Ex-Partner: No    Emotionally Abused: No    Physically Abused: No    Sexually Abused: No   Family History  Problem Relation Age of Onset   Diabetes Other    Diabetes Mother    Heart disease Mother    Heart disease Father    Diabetes Sister    Diabetes Brother    Hypertension Brother    Diabetes Daughter    Hypertension Maternal Grandmother    Stroke Maternal Grandfather    Early death Paternal Grandfather    Colon cancer Neg Hx    Liver disease Neg Hx       Review of Systems  All other systems reviewed and are negative.      Objective:   Physical Exam Vitals reviewed.  Constitutional:      General: She is not in acute distress.    Appearance: Normal appearance. She is well-developed and normal weight. She is not ill-appearing.  HENT:     Right Ear: Tympanic membrane and ear canal normal.     Left Ear: Tympanic membrane and ear canal normal.     Nose: No congestion or rhinorrhea.     Right Sinus: No maxillary sinus tenderness or frontal sinus tenderness.     Left Sinus: No maxillary sinus tenderness or frontal sinus tenderness.  Eyes:     General: No visual field deficit.    Extraocular Movements: Extraocular movements intact.     Right eye: Normal extraocular motion and no nystagmus.     Left  eye: Normal extraocular motion and no nystagmus.     Conjunctiva/sclera: Conjunctivae normal.     Pupils: Pupils are equal, round, and reactive to light.     Right eye: Pupil is round and reactive.     Left eye: Pupil is round and reactive.  Neck:     Vascular: No carotid bruit.  Cardiovascular:     Rate and Rhythm: Normal rate and regular rhythm.     Heart sounds: Normal heart sounds. No murmur heard.    No friction rub. No gallop.  Pulmonary:     Effort: Pulmonary effort is normal. No respiratory distress.     Breath sounds: Normal breath sounds. No stridor. No wheezing or rales.  Abdominal:     General: Abdomen is flat. Bowel sounds are normal. There is no distension.     Palpations: Abdomen is soft.     Tenderness: There is no abdominal tenderness. There is no guarding.  Musculoskeletal:     Right hand: Normal. No swelling, deformity, tenderness or bony tenderness. Normal range of motion.     Left hand: Normal. No swelling, deformity, tenderness or bony tenderness. Normal range of motion.     Cervical back: Normal range of motion. No rigidity.     Right lower leg: Normal. No swelling, deformity, tenderness or bony tenderness. No edema.     Left lower leg: No deformity, tenderness or bony tenderness.     Right ankle: Normal. No swelling.     Left ankle: Normal. No swelling.  Lymphadenopathy:     Cervical: No cervical adenopathy.  Neurological:     General: No focal deficit present.     Mental Status:  She is alert and oriented to person, place, and time. Mental status is at baseline.     Cranial Nerves: No cranial nerve deficit, dysarthria or facial asymmetry.     Sensory: No sensory deficit.     Motor: Tremor present. No weakness or abnormal muscle tone.     Coordination: Romberg sign negative. Coordination normal.     Gait: Gait normal.  Psychiatric:        Mood and Affect: Mood normal.        Behavior: Behavior normal.        Thought Content: Thought content normal.         Judgment: Judgment normal.         Assessment & Plan:  Benign essential HTN - Plan: CBC with Differential/Platelet, COMPLETE METABOLIC PANEL WITH GFR  PVD (peripheral vascular disease) (HCC)  Cerebrovascular accident (CVA), unspecified mechanism (HCC)  Acute deep vein thrombosis (DVT) of proximal vein of left lower extremity (HCC) Blood pressure is acceptable today.  Patient will continue Eliquis for 3 months total along with her dual antiplatelet therapy.  Because of the stent, she needs to be on dual antiplatelet therapy at least for 6 months and perhaps a year.  I will defer this to her vascular surgeon.  We will continue the Eliquis for 3 months and at that point she will stop Eliquis.  Her biggest risk is bleeding.  Therefore she will monitor herself closely for melena or hematochezia.  I will check a CBC today to evaluate for any occult drop in her hemoglobin.  Will allow permissive hypertension with systolic blood pressure to 150.  Greater than 160 and I would resume Avapro.  Continue Crestor 20 mg a day and recheck lab work in 3 months

## 2023-07-23 LAB — CBC WITH DIFFERENTIAL/PLATELET
Absolute Lymphocytes: 2516 {cells}/uL (ref 850–3900)
Absolute Monocytes: 394 {cells}/uL (ref 200–950)
Basophils Absolute: 20 {cells}/uL (ref 0–200)
Basophils Relative: 0.3 %
Eosinophils Absolute: 252 {cells}/uL (ref 15–500)
Eosinophils Relative: 3.7 %
HCT: 33 % — ABNORMAL LOW (ref 35.0–45.0)
Hemoglobin: 10.8 g/dL — ABNORMAL LOW (ref 11.7–15.5)
MCH: 30.4 pg (ref 27.0–33.0)
MCHC: 32.7 g/dL (ref 32.0–36.0)
MCV: 93 fL (ref 80.0–100.0)
MPV: 9.1 fL (ref 7.5–12.5)
Monocytes Relative: 5.8 %
Neutro Abs: 3618 {cells}/uL (ref 1500–7800)
Neutrophils Relative %: 53.2 %
Platelets: 407 10*3/uL — ABNORMAL HIGH (ref 140–400)
RBC: 3.55 10*6/uL — ABNORMAL LOW (ref 3.80–5.10)
RDW: 12.2 % (ref 11.0–15.0)
Total Lymphocyte: 37 %
WBC: 6.8 10*3/uL (ref 3.8–10.8)

## 2023-07-23 LAB — COMPLETE METABOLIC PANEL WITH GFR
AG Ratio: 1.6 (calc) (ref 1.0–2.5)
ALT: 9 U/L (ref 6–29)
AST: 15 U/L (ref 10–35)
Albumin: 4 g/dL (ref 3.6–5.1)
Alkaline phosphatase (APISO): 46 U/L (ref 37–153)
BUN: 9 mg/dL (ref 7–25)
CO2: 27 mmol/L (ref 20–32)
Calcium: 8.9 mg/dL (ref 8.6–10.4)
Chloride: 104 mmol/L (ref 98–110)
Creat: 0.87 mg/dL (ref 0.50–1.05)
Globulin: 2.5 g/dL (ref 1.9–3.7)
Glucose, Bld: 97 mg/dL (ref 65–99)
Potassium: 4.1 mmol/L (ref 3.5–5.3)
Sodium: 140 mmol/L (ref 135–146)
Total Bilirubin: 0.6 mg/dL (ref 0.2–1.2)
Total Protein: 6.5 g/dL (ref 6.1–8.1)
eGFR: 72 mL/min/{1.73_m2} (ref >=60)

## 2023-07-25 ENCOUNTER — Telehealth: Payer: Self-pay

## 2023-07-25 NOTE — Telephone Encounter (Signed)
 Copied from CRM [id number]. Topic: Clinical - Medical Advice >> Jul 25, 2023  8:07 AM Patsy Lager T wrote: Reason for CRM: patient called stated she has been getting blood pressure readings of 167/80 for 2 days. Patient said since she had her stoke provider wanted to know when it was running high as he may have to put her back on her blood pressure medication

## 2023-07-26 ENCOUNTER — Encounter (HOSPITAL_COMMUNITY)
Admission: EM | Disposition: A | Discharge status: Home / Self Care | Payer: Self-pay | Source: Home / Self Care | Attending: Neurology

## 2023-07-26 ENCOUNTER — Inpatient Hospital Stay (HOSPITAL_COMMUNITY)

## 2023-07-26 ENCOUNTER — Inpatient Hospital Stay (HOSPITAL_COMMUNITY): Admitting: Certified Registered Nurse Anesthetist

## 2023-07-26 ENCOUNTER — Inpatient Hospital Stay (HOSPITAL_COMMUNITY)
Admission: EM | Admit: 2023-07-26 | Discharge: 2023-07-28 | DRG: 064 | Disposition: A | Discharge status: Home / Self Care | Attending: Student in an Organized Health Care Education/Training Program | Admitting: Student in an Organized Health Care Education/Training Program

## 2023-07-26 ENCOUNTER — Encounter (HOSPITAL_COMMUNITY): Payer: Self-pay | Admitting: Emergency Medicine

## 2023-07-26 ENCOUNTER — Other Ambulatory Visit: Payer: Self-pay

## 2023-07-26 ENCOUNTER — Emergency Department (HOSPITAL_COMMUNITY)

## 2023-07-26 DIAGNOSIS — E119 Type 2 diabetes mellitus without complications: Secondary | ICD-10-CM | POA: Diagnosis not present

## 2023-07-26 DIAGNOSIS — E785 Hyperlipidemia, unspecified: Secondary | ICD-10-CM | POA: Diagnosis not present

## 2023-07-26 DIAGNOSIS — M810 Age-related osteoporosis without current pathological fracture: Secondary | ICD-10-CM | POA: Diagnosis present

## 2023-07-26 DIAGNOSIS — I82411 Acute embolism and thrombosis of right femoral vein: Secondary | ICD-10-CM | POA: Diagnosis not present

## 2023-07-26 DIAGNOSIS — R040 Epistaxis: Secondary | ICD-10-CM | POA: Diagnosis not present

## 2023-07-26 DIAGNOSIS — Z833 Family history of diabetes mellitus: Secondary | ICD-10-CM

## 2023-07-26 DIAGNOSIS — R29701 NIHSS score 1: Secondary | ICD-10-CM | POA: Diagnosis present

## 2023-07-26 DIAGNOSIS — Z7983 Long term (current) use of bisphosphonates: Secondary | ICD-10-CM | POA: Diagnosis not present

## 2023-07-26 DIAGNOSIS — I611 Nontraumatic intracerebral hemorrhage in hemisphere, cortical: Principal | ICD-10-CM | POA: Diagnosis present

## 2023-07-26 DIAGNOSIS — Z86718 Personal history of other venous thrombosis and embolism: Secondary | ICD-10-CM

## 2023-07-26 DIAGNOSIS — I69398 Other sequelae of cerebral infarction: Secondary | ICD-10-CM | POA: Diagnosis not present

## 2023-07-26 DIAGNOSIS — Z79899 Other long term (current) drug therapy: Secondary | ICD-10-CM | POA: Diagnosis not present

## 2023-07-26 DIAGNOSIS — D509 Iron deficiency anemia, unspecified: Secondary | ICD-10-CM | POA: Diagnosis not present

## 2023-07-26 DIAGNOSIS — H539 Unspecified visual disturbance: Secondary | ICD-10-CM | POA: Diagnosis present

## 2023-07-26 DIAGNOSIS — Z888 Allergy status to other drugs, medicaments and biological substances status: Secondary | ICD-10-CM

## 2023-07-26 DIAGNOSIS — I10 Essential (primary) hypertension: Secondary | ICD-10-CM | POA: Diagnosis not present

## 2023-07-26 DIAGNOSIS — I161 Hypertensive emergency: Secondary | ICD-10-CM | POA: Diagnosis not present

## 2023-07-26 DIAGNOSIS — I619 Nontraumatic intracerebral hemorrhage, unspecified: Secondary | ICD-10-CM

## 2023-07-26 DIAGNOSIS — Z7901 Long term (current) use of anticoagulants: Secondary | ICD-10-CM

## 2023-07-26 DIAGNOSIS — F419 Anxiety disorder, unspecified: Secondary | ICD-10-CM | POA: Diagnosis present

## 2023-07-26 DIAGNOSIS — Z555 Less than a high school diploma: Secondary | ICD-10-CM

## 2023-07-26 DIAGNOSIS — M7989 Other specified soft tissue disorders: Secondary | ICD-10-CM | POA: Diagnosis not present

## 2023-07-26 DIAGNOSIS — I6389 Other cerebral infarction: Secondary | ICD-10-CM | POA: Diagnosis present

## 2023-07-26 DIAGNOSIS — Z823 Family history of stroke: Secondary | ICD-10-CM

## 2023-07-26 DIAGNOSIS — D6832 Hemorrhagic disorder due to extrinsic circulating anticoagulants: Secondary | ICD-10-CM | POA: Diagnosis not present

## 2023-07-26 DIAGNOSIS — I63233 Cerebral infarction due to unspecified occlusion or stenosis of bilateral carotid arteries: Secondary | ICD-10-CM | POA: Diagnosis not present

## 2023-07-26 DIAGNOSIS — Z7982 Long term (current) use of aspirin: Secondary | ICD-10-CM

## 2023-07-26 DIAGNOSIS — I612 Nontraumatic intracerebral hemorrhage in hemisphere, unspecified: Principal | ICD-10-CM

## 2023-07-26 DIAGNOSIS — I82412 Acute embolism and thrombosis of left femoral vein: Secondary | ICD-10-CM | POA: Diagnosis present

## 2023-07-26 DIAGNOSIS — I739 Peripheral vascular disease, unspecified: Secondary | ICD-10-CM | POA: Diagnosis not present

## 2023-07-26 DIAGNOSIS — H53462 Homonymous bilateral field defects, left side: Secondary | ICD-10-CM | POA: Diagnosis not present

## 2023-07-26 DIAGNOSIS — Z9049 Acquired absence of other specified parts of digestive tract: Secondary | ICD-10-CM

## 2023-07-26 DIAGNOSIS — I639 Cerebral infarction, unspecified: Secondary | ICD-10-CM

## 2023-07-26 DIAGNOSIS — R7303 Prediabetes: Secondary | ICD-10-CM | POA: Diagnosis present

## 2023-07-26 DIAGNOSIS — I63531 Cerebral infarction due to unspecified occlusion or stenosis of right posterior cerebral artery: Secondary | ICD-10-CM | POA: Diagnosis not present

## 2023-07-26 DIAGNOSIS — Z860100 Personal history of colon polyps, unspecified: Secondary | ICD-10-CM

## 2023-07-26 DIAGNOSIS — Z0389 Encounter for observation for other suspected diseases and conditions ruled out: Secondary | ICD-10-CM | POA: Diagnosis not present

## 2023-07-26 DIAGNOSIS — I629 Nontraumatic intracranial hemorrhage, unspecified: Secondary | ICD-10-CM | POA: Diagnosis not present

## 2023-07-26 DIAGNOSIS — D649 Anemia, unspecified: Secondary | ICD-10-CM | POA: Diagnosis present

## 2023-07-26 DIAGNOSIS — Z91040 Latex allergy status: Secondary | ICD-10-CM

## 2023-07-26 DIAGNOSIS — I2510 Atherosclerotic heart disease of native coronary artery without angina pectoris: Secondary | ICD-10-CM | POA: Diagnosis not present

## 2023-07-26 DIAGNOSIS — Z881 Allergy status to other antibiotic agents status: Secondary | ICD-10-CM

## 2023-07-26 DIAGNOSIS — R519 Headache, unspecified: Secondary | ICD-10-CM | POA: Diagnosis not present

## 2023-07-26 DIAGNOSIS — Z8249 Family history of ischemic heart disease and other diseases of the circulatory system: Secondary | ICD-10-CM

## 2023-07-26 DIAGNOSIS — Z87891 Personal history of nicotine dependence: Secondary | ICD-10-CM

## 2023-07-26 DIAGNOSIS — Z7902 Long term (current) use of antithrombotics/antiplatelets: Secondary | ICD-10-CM

## 2023-07-26 HISTORY — PX: VENA CAVA UMBRELLA NECK APPROACH: SHX1851

## 2023-07-26 LAB — CBC WITH DIFFERENTIAL/PLATELET
Abs Immature Granulocytes: 0.02 10*3/uL (ref 0.00–0.07)
Basophils Absolute: 0 10*3/uL (ref 0.0–0.1)
Basophils Relative: 1 %
Eosinophils Absolute: 0.3 10*3/uL (ref 0.0–0.5)
Eosinophils Relative: 4 %
HCT: 35.1 % — ABNORMAL LOW (ref 36.0–46.0)
Hemoglobin: 11.5 g/dL — ABNORMAL LOW (ref 12.0–15.0)
Immature Granulocytes: 0 %
Lymphocytes Relative: 37 %
Lymphs Abs: 2.6 10*3/uL (ref 0.7–4.0)
MCH: 30 pg (ref 26.0–34.0)
MCHC: 32.8 g/dL (ref 30.0–36.0)
MCV: 91.6 fL (ref 80.0–100.0)
Monocytes Absolute: 0.4 10*3/uL (ref 0.1–1.0)
Monocytes Relative: 5 %
Neutro Abs: 3.6 10*3/uL (ref 1.7–7.7)
Neutrophils Relative %: 53 %
Platelets: 399 10*3/uL (ref 150–400)
RBC: 3.83 MIL/uL — ABNORMAL LOW (ref 3.87–5.11)
RDW: 12.4 % (ref 11.5–15.5)
WBC: 6.9 10*3/uL (ref 4.0–10.5)
nRBC: 0 % (ref 0.0–0.2)

## 2023-07-26 LAB — COMPREHENSIVE METABOLIC PANEL
ALT: 14 U/L (ref 0–44)
AST: 19 U/L (ref 15–41)
Albumin: 3.6 g/dL (ref 3.5–5.0)
Alkaline Phosphatase: 55 U/L (ref 38–126)
Anion gap: 12 (ref 5–15)
BUN: 6 mg/dL — ABNORMAL LOW (ref 8–23)
CO2: 22 mmol/L (ref 22–32)
Calcium: 8.6 mg/dL — ABNORMAL LOW (ref 8.9–10.3)
Chloride: 107 mmol/L (ref 98–111)
Creatinine, Ser: 0.78 mg/dL (ref 0.44–1.00)
GFR, Estimated: >60 mL/min (ref >60)
Glucose, Bld: 112 mg/dL — ABNORMAL HIGH (ref 70–99)
Potassium: 3.9 mmol/L (ref 3.5–5.1)
Sodium: 141 mmol/L (ref 135–145)
Total Bilirubin: 0.9 mg/dL (ref 0.0–1.2)
Total Protein: 6.9 g/dL (ref 6.5–8.1)

## 2023-07-26 LAB — URINALYSIS, ROUTINE W REFLEX MICROSCOPIC
Bacteria, UA: NONE SEEN
Bilirubin Urine: NEGATIVE
Glucose, UA: NEGATIVE mg/dL
Ketones, ur: NEGATIVE mg/dL
Leukocytes,Ua: NEGATIVE
Nitrite: NEGATIVE
Protein, ur: NEGATIVE mg/dL
Specific Gravity, Urine: 1.002 — ABNORMAL LOW (ref 1.005–1.030)
pH: 7 (ref 5.0–8.0)

## 2023-07-26 LAB — POCT I-STAT, CHEM 8
BUN: 5 mg/dL — ABNORMAL LOW (ref 8–23)
Calcium, Ion: 1.14 mmol/L — ABNORMAL LOW (ref 1.15–1.40)
Chloride: 107 mmol/L (ref 98–111)
Creatinine, Ser: 0.8 mg/dL (ref 0.44–1.00)
Glucose, Bld: 110 mg/dL — ABNORMAL HIGH (ref 70–99)
HCT: 34 % — ABNORMAL LOW (ref 36.0–46.0)
Hemoglobin: 11.6 g/dL — ABNORMAL LOW (ref 12.0–15.0)
Potassium: 4 mmol/L (ref 3.5–5.1)
Sodium: 140 mmol/L (ref 135–145)
TCO2: 27 mmol/L (ref 22–32)

## 2023-07-26 LAB — BASIC METABOLIC PANEL
Anion gap: 7 (ref 5–15)
BUN: 5 mg/dL — ABNORMAL LOW (ref 8–23)
CO2: 23 mmol/L (ref 22–32)
Calcium: 8.8 mg/dL — ABNORMAL LOW (ref 8.9–10.3)
Chloride: 108 mmol/L (ref 98–111)
Creatinine, Ser: 0.79 mg/dL (ref 0.44–1.00)
GFR, Estimated: >60 mL/min (ref >60)
Glucose, Bld: 116 mg/dL — ABNORMAL HIGH (ref 70–99)
Potassium: 4 mmol/L (ref 3.5–5.1)
Sodium: 138 mmol/L (ref 135–145)

## 2023-07-26 LAB — RAPID URINE DRUG SCREEN, HOSP PERFORMED
Amphetamines: NOT DETECTED
Barbiturates: NOT DETECTED
Benzodiazepines: NOT DETECTED
Cocaine: NOT DETECTED
Opiates: NOT DETECTED
Tetrahydrocannabinol: NOT DETECTED

## 2023-07-26 LAB — SURGICAL PCR SCREEN
MRSA, PCR: NEGATIVE
Staphylococcus aureus: NEGATIVE

## 2023-07-26 LAB — ETHANOL: Alcohol, Ethyl (B): <10 mg/dL (ref <10)

## 2023-07-26 LAB — MRSA NEXT GEN BY PCR, NASAL: MRSA by PCR Next Gen: NOT DETECTED

## 2023-07-26 LAB — PROTIME-INR
INR: 1.2 (ref 0.8–1.2)
Prothrombin Time: 15.6 s — ABNORMAL HIGH (ref 11.4–15.2)

## 2023-07-26 LAB — APTT: aPTT: 34 s (ref 24–36)

## 2023-07-26 SURGERY — INSERTION, UMBRELLA FILTER, INFERIOR VENA CAVA
Anesthesia: General

## 2023-07-26 MED ORDER — BUTALBITAL-APAP-CAFFEINE 50-325-40 MG PO TABS
1.0000 | ORAL_TABLET | Freq: Three times a day (TID) | ORAL | Status: DC | PRN
  Administered 2023-07-26 – 2023-07-27 (×3): 1 via ORAL
  Filled 2023-07-26 (×3): qty 1

## 2023-07-26 MED ORDER — HEPARIN 6000 UNIT IRRIGATION SOLUTION
Status: AC
  Filled 2023-07-26: qty 500

## 2023-07-26 MED ORDER — ONDANSETRON HCL 4 MG/2ML IJ SOLN
INTRAMUSCULAR | Status: DC | PRN
Start: 2023-07-26 — End: 2023-07-26
  Administered 2023-07-26: 4 mg via INTRAVENOUS

## 2023-07-26 MED ORDER — ACETAMINOPHEN 325 MG PO TABS
650.0000 mg | ORAL_TABLET | ORAL | Status: DC | PRN
  Administered 2023-07-26 – 2023-07-27 (×3): 650 mg via ORAL
  Filled 2023-07-26 (×3): qty 2

## 2023-07-26 MED ORDER — CEFAZOLIN SODIUM 1 G IJ SOLR
INTRAMUSCULAR | Status: AC
  Filled 2023-07-26: qty 20

## 2023-07-26 MED ORDER — PANTOPRAZOLE SODIUM 40 MG IV SOLR
40.0000 mg | Freq: Every day | INTRAVENOUS | Status: DC
Start: 2023-07-26 — End: 2023-07-26

## 2023-07-26 MED ORDER — SENNOSIDES-DOCUSATE SODIUM 8.6-50 MG PO TABS
1.0000 | ORAL_TABLET | Freq: Two times a day (BID) | ORAL | Status: DC
  Administered 2023-07-27 – 2023-07-28 (×3): 1 via ORAL
  Filled 2023-07-26 (×3): qty 1

## 2023-07-26 MED ORDER — PROPOFOL 10 MG/ML IV BOLUS
INTRAVENOUS | Status: AC
Start: 2023-07-26 — End: ?
  Filled 2023-07-26: qty 20

## 2023-07-26 MED ORDER — FENTANYL CITRATE (PF) 250 MCG/5ML IJ SOLN
INTRAMUSCULAR | Status: DC | PRN
  Administered 2023-07-26: 50 ug via INTRAVENOUS

## 2023-07-26 MED ORDER — PROPOFOL 1000 MG/100ML IV EMUL
INTRAVENOUS | Status: AC
  Filled 2023-07-26: qty 100

## 2023-07-26 MED ORDER — VITAMIN B-12 1000 MCG PO TABS
2500.0000 ug | ORAL_TABLET | Freq: Every day | ORAL | Status: DC
  Administered 2023-07-26 – 2023-07-28 (×3): 2500 ug via ORAL
  Filled 2023-07-26 (×3): qty 3

## 2023-07-26 MED ORDER — LIDOCAINE-EPINEPHRINE (PF) 1 %-1:200000 IJ SOLN
INTRAMUSCULAR | Status: DC | PRN
  Administered 2023-07-26: 5 mL

## 2023-07-26 MED ORDER — PROPOFOL 10 MG/ML IV BOLUS
INTRAVENOUS | Status: DC | PRN
Start: 2023-07-26 — End: 2023-07-26
  Administered 2023-07-26 (×3): 10 mg via INTRAVENOUS

## 2023-07-26 MED ORDER — HEPARIN 6000 UNIT IRRIGATION SOLUTION
Status: DC | PRN
  Administered 2023-07-26: 1

## 2023-07-26 MED ORDER — HYDRALAZINE HCL 20 MG/ML IJ SOLN
10.0000 mg | Freq: Once | INTRAMUSCULAR | Status: AC | PRN
  Administered 2023-07-26: 10 mg via INTRAVENOUS
  Filled 2023-07-26: qty 1

## 2023-07-26 MED ORDER — CLEVIDIPINE BUTYRATE 0.5 MG/ML IV EMUL
0.0000 mg/h | INTRAVENOUS | Status: DC
  Administered 2023-07-26: 2 mg/h via INTRAVENOUS
  Filled 2023-07-26: qty 100

## 2023-07-26 MED ORDER — STROKE: EARLY STAGES OF RECOVERY BOOK
Freq: Once | Status: AC
  Filled 2023-07-26: qty 1

## 2023-07-26 MED ORDER — ORAL CARE MOUTH RINSE: 15.0000 mL | OROMUCOSAL | Status: DC | PRN

## 2023-07-26 MED ORDER — IODIXANOL 320 MG/ML IV SOLN
INTRAVENOUS | Status: DC | PRN
Start: 2023-07-26 — End: 2023-07-26
  Administered 2023-07-26: 10 mL

## 2023-07-26 MED ORDER — LABETALOL HCL 5 MG/ML IV SOLN
10.0000 mg | Freq: Once | INTRAVENOUS | Status: AC
  Administered 2023-07-26: 10 mg via INTRAVENOUS

## 2023-07-26 MED ORDER — LIDOCAINE-EPINEPHRINE (PF) 1 %-1:200000 IJ SOLN
INTRAMUSCULAR | Status: AC
  Filled 2023-07-26: qty 30

## 2023-07-26 MED ORDER — FENTANYL CITRATE (PF) 250 MCG/5ML IJ SOLN
INTRAMUSCULAR | Status: AC
  Filled 2023-07-26: qty 5

## 2023-07-26 MED ORDER — LIDOCAINE 2% (20 MG/ML) 5 ML SYRINGE
INTRAMUSCULAR | Status: DC | PRN
Start: 2023-07-26 — End: 2023-07-26
  Administered 2023-07-26: 50 mg via INTRAVENOUS

## 2023-07-26 MED ORDER — CEFAZOLIN SODIUM-DEXTROSE 2-3 GM-%(50ML) IV SOLR
INTRAVENOUS | Status: DC | PRN
  Administered 2023-07-26: 2 g via INTRAVENOUS

## 2023-07-26 MED ORDER — CARVEDILOL 3.125 MG PO TABS
3.1250 mg | ORAL_TABLET | Freq: Two times a day (BID) | ORAL | Status: DC
  Administered 2023-07-26 – 2023-07-28 (×5): 3.125 mg via ORAL
  Filled 2023-07-26 (×5): qty 1

## 2023-07-26 MED ORDER — CHLORHEXIDINE GLUCONATE CLOTH 2 % EX PADS
6.0000 | MEDICATED_PAD | Freq: Every day | CUTANEOUS | Status: DC
  Administered 2023-07-26 – 2023-07-28 (×3): 6 via TOPICAL

## 2023-07-26 MED ORDER — PROPOFOL 500 MG/50ML IV EMUL
INTRAVENOUS | Status: DC | PRN
  Administered 2023-07-26: 25 ug/kg/min via INTRAVENOUS

## 2023-07-26 MED ORDER — ACETAMINOPHEN 650 MG RE SUPP: 650.0000 mg | RECTAL | Status: DC | PRN

## 2023-07-26 MED ORDER — ROSUVASTATIN CALCIUM 20 MG PO TABS
20.0000 mg | ORAL_TABLET | Freq: Every day | ORAL | Status: DC
Start: 2023-07-26 — End: 2023-07-28
  Administered 2023-07-26 – 2023-07-28 (×3): 20 mg via ORAL
  Filled 2023-07-26 (×3): qty 1

## 2023-07-26 MED ORDER — LABETALOL HCL 5 MG/ML IV SOLN: 10.0000 mg | INTRAVENOUS | Status: DC | PRN

## 2023-07-26 MED ORDER — LACTATED RINGERS IV SOLN: INTRAVENOUS | Status: DC | PRN

## 2023-07-26 MED ORDER — ACETAMINOPHEN 160 MG/5ML PO SOLN: 650.0000 mg | ORAL | Status: DC | PRN

## 2023-07-26 MED ORDER — PANTOPRAZOLE SODIUM 40 MG PO TBEC
40.0000 mg | DELAYED_RELEASE_TABLET | Freq: Every day | ORAL | Status: DC
  Administered 2023-07-26 – 2023-07-28 (×3): 40 mg via ORAL
  Filled 2023-07-26 (×3): qty 1

## 2023-07-26 SURGICAL SUPPLY — 48 items
BAG COUNTER SPONGE SURGICOUNT (BAG) ×1 IMPLANT
BAG DECANTER FOR FLEXI CONT (MISCELLANEOUS) ×1 IMPLANT
BLADE SURG 11 STRL SS (BLADE) ×1 IMPLANT
CATH ANGIO 5F PIGTAIL 65CM (CATHETERS) IMPLANT
CLIP LIGATING EXTRA MED SLVR (CLIP) ×1 IMPLANT
CLIP LIGATING EXTRA SM BLUE (MISCELLANEOUS) ×1 IMPLANT
CLIP TI MEDIUM 6 (CLIP) ×1 IMPLANT
CLIP TI WIDE RED SMALL 6 (CLIP) ×1 IMPLANT
COVER BACK TABLE 60X90IN (DRAPES) ×1 IMPLANT
COVER PROBE W GEL 5X96 (DRAPES) ×1 IMPLANT
DERMABOND ADVANCED .7 DNX12 (GAUZE/BANDAGES/DRESSINGS) ×1 IMPLANT
DEVICE ENSNARE 12MMX20MM (VASCULAR PRODUCTS) IMPLANT
DRAPE C-ARM 42X72 X-RAY (DRAPES) ×1 IMPLANT
DRAPE LAPAROTOMY T 102X78X121 (DRAPES) ×1 IMPLANT
FILTER VC CELECT-FEMORAL (Filter) IMPLANT
GAUZE 4X4 16PLY ~~LOC~~+RFID DBL (SPONGE) ×1 IMPLANT
GAUZE SPONGE 4X4 12PLY STRL (GAUZE/BANDAGES/DRESSINGS) ×1 IMPLANT
GLOVE BIOGEL PI IND STRL 7.0 (GLOVE) ×1 IMPLANT
GLOVE BIOGEL PI IND STRL 8 (GLOVE) ×1 IMPLANT
GOWN STRL REUS W/ TWL LRG LVL3 (GOWN DISPOSABLE) ×2 IMPLANT
KIT BASIN OR (CUSTOM PROCEDURE TRAY) ×1 IMPLANT
KIT MICROPUNCTURE NIT STIFF (SHEATH) ×1 IMPLANT
KIT NAMIC PS PRESSURIZED FLUID (KITS) IMPLANT
KIT TURNOVER KIT B (KITS) ×1 IMPLANT
NDL HYPO 25GX1X1/2 BEV (NEEDLE) ×1 IMPLANT
NDL PERC 18GX7CM (NEEDLE) ×1 IMPLANT
NEEDLE HYPO 25GX1X1/2 BEV (NEEDLE) ×1 IMPLANT
NEEDLE PERC 18GX7CM (NEEDLE) ×1 IMPLANT
NS IRRIG 1000ML POUR BTL (IV SOLUTION) ×1 IMPLANT
PACK ENDO MINOR (CUSTOM PROCEDURE TRAY) ×1 IMPLANT
PACK SRG BSC III STRL LF ECLPS (CUSTOM PROCEDURE TRAY) ×1 IMPLANT
PAD ARMBOARD POSITIONER FOAM (MISCELLANEOUS) ×2 IMPLANT
POWDER SURGICEL 3.0 GRAM (HEMOSTASIS) IMPLANT
SHEATH DRYSEAL FLEX 12FR 33CM (SHEATH) IMPLANT
SHEATH PINNACLE 6F 10CM (SHEATH) IMPLANT
SNARE GOOSENECK 10MM (VASCULAR PRODUCTS) IMPLANT
SPIKE FLUID TRANSFER (MISCELLANEOUS) ×1 IMPLANT
SPONGE T-LAP 18X18 ~~LOC~~+RFID (SPONGE) ×1 IMPLANT
SUT VICRYL 4-0 PS2 18IN ABS (SUTURE) ×1 IMPLANT
SYR 30ML LL (SYRINGE) ×1 IMPLANT
SYR 5ML LL (SYRINGE) ×1 IMPLANT
SYR CONTROL 10ML LL (SYRINGE) ×1 IMPLANT
SYR MEDRAD MARK V 150ML (SYRINGE) ×1 IMPLANT
TOWEL GREEN STERILE (TOWEL DISPOSABLE) ×1 IMPLANT
TUBING INJECTOR 48 (MISCELLANEOUS) IMPLANT
WATER STERILE IRR 1000ML POUR (IV SOLUTION) ×1 IMPLANT
WIRE BENTSON .035X145CM (WIRE) ×1 IMPLANT
WIRE J 3MM .035X145CM (WIRE) ×1 IMPLANT

## 2023-07-26 NOTE — Progress Notes (Signed)
 Lower extremity venous duplex completed. Please see CV Procedures for preliminary results.  Shona Simpson, RVT 07/26/23 10:23 AM

## 2023-07-26 NOTE — OR Nursing (Signed)
 Patient came into OR with dentures. They were placed into a pink denture cup and placed at the circulator's desk. Circulator will hand them off to PACU once case is over.

## 2023-07-26 NOTE — ED Provider Notes (Signed)
 [place] [facility]PARTMENT AT [facility] Provider Note   CSN: [id number] Arrival date & time: 07/26/23  0454     History  Chief Complaint  Patient presents with   Headache   Epistaxis    [name] is a 68 y.o. female.  The history is provided by the patient.  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  She states that at about 5 PM yesterday she started having a headache in the frontal area and left occipital area.  Headache is mild to moderate.  She has also had several episodes of right-sided epistaxis.  She thinks her vision might be slightly more blurry than normal but she denies any weakness or numbness or tingling.  Of note, she is on dual antiplatelet therapy because of placement of bilateral common iliac artery stents 2 weeks ago.  DVT was diagnosed about 1 week ago.   Home Medications Prior to Admission medications   Medication Sig Start Date End Date Taking? Authorizing Provider  acetaminophen (TYLENOL) 500 MG tablet Take 500 mg by mouth every 6 (six) hours as needed for moderate pain or headache.    [provider]  alendronate (FOSAMAX) 70 MG tablet TAKE 1 TABLET(70 MG) BY MOUTH EVERY 7 DAYS WITH A FULL GLASS OF WATER AND ON AN EMPTY STOMACH 07/06/23   [name], MD  ALPRAZolam Prudy Feeler) 0.5 MG tablet TAKE 1 TABLET(0.5 MG) BY MOUTH TWICE DAILY AS NEEDED FOR ANXIETY 07/15/23   [name], MD  apixaban (ELIQUIS) 5 MG TABS tablet Take 1 tablet (5 mg total) by mouth 2 (two) times daily. 08/19/23   [name], MD  APIXABAN Everlene Balls) VTE STARTER PACK (10MG  AND 5MG ) Take as directed on package: start with two-5mg  tablets twice daily for 7 days. On day 8, switch to one-5mg  tablet twice daily. 07/19/23 08/18/23  Emilie Rutter, PA-C  aspirin EC 81 MG tablet Take 81 mg by mouth at bedtime.      [provider]  carvedilol (COREG) 3.125 MG tablet TAKE 1 TABLET(3.125 MG) BY MOUTH TWICE DAILY 05/13/23   Gaston Islam., NP  Cholecalciferol (VITAMIN D) 50 MCG (2000 UT) CAPS Take 2,000 Units by mouth daily.    [provider]  clopidogrel (PLAVIX) 75 MG tablet Take 1 tablet (75 mg total) by mouth daily. 07/11/23 07/10/24  Maeola Harman, MD  Coenzyme Q10 (COQ10) 100 MG CAPS Take 100 mg by mouth daily.    [provider]  CRANBERRY PO Take 1 each by mouth 2 (two) times a week. Chewables    [provider]  Cyanocobalamin (B-12) 2500 MCG TABS Take 2,500 mcg by mouth daily.    [provider]  dexlansoprazole (DEXILANT) 60 MG capsule Take 1 capsule (60 mg total) by mouth daily. 05/18/22   [name], [name] Friends, MD  EPINEPHrine 0.3 mg/0.3 mL IJ SOAJ injection Inject 0.3 mg into the muscle as needed for anaphylaxis. 03/12/22   [name], MD  fluticasone (FLONASE) 50 MCG/ACT nasal spray Place 2 sprays into both nostrils daily as needed for allergies. 01/23/23   [provider]  hydroquinone 4 % cream Apply 1 application topically 2 (two) times daily as needed (dark spots). 01/05/21   [provider]  hydrOXYzine (VISTARIL) 25 MG capsule TAKE 1 CAPSULE(25 MG) BY MOUTH EVERY 8 HOURS AS NEEDED 09/21/22   Pickard,  Priscille Heidelberg, MD  ipratropium (ATROVENT) 0.03 % nasal spray INSTILL 2 SPRAYS IN EACH NOSTRIL EVERY 12 HOURS Patient taking differently: Place 2 sprays into both nostrils 2 (two) times daily as needed for rhinitis. 04/17/21   [name], MD  loratadine (CLARITIN) 10 MG tablet Take 10 mg by mouth daily as needed for allergies.    [provider]  MAGNESIUM PO Take 330 mg by mouth daily.    [provider]  Omega-3 Fatty Acids (FISH OIL) 1000 MG CAPS Take 1,000 mg by mouth 3 (three) times a week.    [provider]  ondansetron (ZOFRAN) 4 MG tablet TAKE 1 TABLET(4 MG) BY MOUTH EVERY 8 HOURS AS NEEDED  FOR NAUSEA OR VOMITING 03/06/21   [name], PA-C  Plecanatide (TRULANCE) 3 MG TABS Take 3 mg by mouth daily. 05/18/22   [name], [name] Friends, MD  rosuvastatin (CRESTOR) 20 MG tablet Take 1 tablet (20 mg total) by mouth daily. 07/22/23   [name], MD  sodium chloride (OCEAN) 0.65 % SOLN nasal spray Place 1 spray into both nostrils as needed for congestion.    [provider]  triamcinolone cream (KENALOG) 0.1 % Apply 1 Application topically 2 (two) times daily. Patient taking differently: Apply 1 Application topically 2 (two) times daily as needed (irritation). 10/29/21   [name], MD  vitamin E 400 UNIT capsule Take 400 Units by mouth 3 (three) times a week.    [provider]  zinc gluconate 50 MG tablet Take 50 mg by mouth 3 (three) times a week.    [provider]      Allergies    Ciprofloxacin, Latex, and Vitamin c [bioflavonoid products]    Review of Systems   Review of Systems  HENT:  Positive for nosebleeds.   Neurological:  Positive for headaches.  All other systems reviewed and are negative.   Physical Exam Updated Vital Signs BP (!) 147/59   Pulse 71   Temp 98.1 F (36.7 C) (Oral)   Resp 15   Ht 5\' 7"  (1.702 m)   Wt 73.5 kg   SpO2 100%   BMI 25.37 kg/m  Physical Exam Vitals and nursing note reviewed.   68 year old female, resting comfortably and in no acute distress. Vital signs are significant for elevated blood pressure. Oxygen saturation is 100%, which is normal. Head is normocephalic and atraumatic. PERRLA, EOMI. Oropharynx is clear.  Examination of the nasal cavities shows no obvious bleeding site. Neck is nontender and supple without adenopathy or JVD. Lungs are clear without rales, wheezes, or rhonchi. Chest is nontender. Heart has regular rate and rhythm without murmur. Abdomen is soft, flat, nontender. Neurologic: Mental status is normal, cranial nerves are intact, visual fields are intact to confrontation.  No  gross motor or sensory deficits.  ED Results / Procedures / Treatments   Labs (all labs ordered are listed, but only abnormal results are displayed) Labs Reviewed  CBC WITH DIFFERENTIAL/PLATELET - Abnormal; Notable for the following components:      Result Value   RBC 3.83 (*)    Hemoglobin 11.5 (*)    HCT 35.1 (*)    All other components within normal limits  BASIC METABOLIC PANEL - Abnormal; Notable for the following components:   Glucose, Bld 116 (*)    BUN 5 (*)    Calcium 8.8 (*)    All other components within normal limits  PROTIME-INR - Abnormal; Notable for the following  components:   Prothrombin Time 15.6 (*)    All other components within normal limits  URINALYSIS, ROUTINE W REFLEX MICROSCOPIC - Abnormal; Notable for the following components:   Color, Urine STRAW (*)    Specific Gravity, Urine 1.002 (*)    Hgb urine dipstick SMALL (*)    All other components within normal limits  APTT  ETHANOL  COMPREHENSIVE METABOLIC PANEL  RAPID URINE DRUG SCREEN, HOSP PERFORMED  I-STAT CHEM 8, ED    EKG EKG Interpretation Date/Time:  Tuesday July 26 2023 05:53:08 EDT Ventricular Rate:  76 PR Interval:  161 QRS Duration:  90 QT Interval:  410 QTC Calculation: 461 R Axis:   47  Text Interpretation: Sinus rhythm Normal ECG When compared with ECG of 05/02/2023, No significant change was found Confirmed by Dione Booze ([id number]) on 07/26/2023 6:36:00 AM  Radiology CT HEAD WO CONTRAST ( ) Addendum Date: 07/26/2023 ADDENDUM REPORT: 07/26/2023 06:21 ADDENDUM: Study discussed by telephone with Dr. Dione Booze in the ED on 07/26/2023 at 0608 hours. Electronically Signed   By: Odessa Fleming M.D.   On: 07/26/2023 06:21   Result Date: 07/26/2023 CLINICAL DATA:  68 year old female with new onset headache at 2300 hours, epistaxis. On Eliquis and Plavix. Recent peripheral vascular stent placement. Recent right PCA territory infarct this month. EXAM: CT HEAD WITHOUT CONTRAST TECHNIQUE: Contiguous  axial images were obtained from the base of the skull through the vertex without intravenous contrast. RADIATION DOSE REDUCTION: This exam was performed according to the departmental dose-optimization program which includes automated exposure control, adjustment of the mA and/or kV according to patient size and/or use of iterative reconstruction technique. COMPARISON:  Brain MRI 07/12/2023.  Head CT 07/12/2023. FINDINGS: Brain: Hemorrhagic transformation of the right PCA infarct diagnosed on 07/12/2023 (series 3, image 16). Hemorrhage is confluent but petechial type, with only mild regional mass effect and no significant intracranial mass effect or midline shift overall. No intraventricular or extra-axial extension is identified. Stable underlying cerebral volume and extent of the right PCA infarct is stable. Stable gray-white differentiation elsewhere. No ventriculomegaly. Normal basilar cisterns. Vascular: Calcified atherosclerosis at the skull base. No suspicious intracranial vascular hyperdensity. Skull: Stable, intact. Sinuses/Orbits: Increased but generally mild paranasal sinus mucosal thickening. No sinus fluid levels. Tympanic cavities and mastoids remain well aerated. Other: Visualized orbits and scalp soft tissues are stable, within normal limits. IMPRESSION: 1. Positive for hemorrhagic transformation of the Right PCA infarct since 07/12/2023, but confluent petechial type rather than malignant intracranial hemorrhage. No significant intracranial mass effect or other complicating features at this time. 2. Stable extent of the underlying PCA infarct. No other acute intracranial abnormality. Electronically Signed: By: Odessa Fleming M.D. On: 07/26/2023 05:54    Procedures Procedures  Cardiac monitor shows normal sinus rhythm, per my interpretation.  Medications Ordered in ED Medications  clevidipine (CLEVIPREX) infusion 0.5 mg/mL (2 mg/hr Intravenous New Bag/Given 07/26/23 0636)  labetalol (NORMODYNE)  injection 10 mg (has no administration in time range)   stroke: early stages of recovery book (has no administration in time range)  acetaminophen (TYLENOL) tablet 650 mg (has no administration in time range)    Or  acetaminophen (TYLENOL) 160 MG/5ML solution 650 mg (has no administration in time range)    Or  acetaminophen (TYLENOL) suppository 650 mg (has no administration in time range)  senna-docusate (Senokot-S) tablet 1 tablet (has no administration in time range)  pantoprazole (PROTONIX) injection 40 mg (has no administration in time range)  labetalol (NORMODYNE) injection 10 mg (10  mg Intravenous Given 07/26/23 0600)  hydrALAZINE (APRESOLINE) injection 10 mg (10 mg Intravenous Given 07/26/23 0620)    ED Course/ Medical Decision Making/ A&P                                 Medical Decision Making Amount and/or Complexity of Data Reviewed Radiology: ordered.  Risk Prescription drug management. Decision regarding hospitalization.   Headache and patient who is on anticoagulants and has history of stroke, need to rule out intracranial hemorrhage.  Report of epistaxis but no evidence of ongoing bleeding and no obvious bleeding site.  CT of head shows hemorrhagic transformation of previous right posterior cerebral artery infarct without mass effect.  I have independently viewed the images and agree with radiologist interpretation.  Have also discussed the findings directly with the radiologist.  Her last dose of apixaban was at 7 PM.  I have discussed case with Dr. Wilford Corner of neurology service who has also reviewed the images.  At this point, patient does not seem to need emergent reversal of anticoagulation however, she did need better blood pressure control.  I ordered a dose of labetalol.  Following this, blood pressure has come down to a satisfactory level.  Plan is for repeat CT of head 3 hours after the initial scan and, if bleeding is stable, just continue to observe.  However, there was  concerned about management of her DVT.  I have discussed case with Dr. Sherral Hammers, on-call for vascular surgery.  He recommends getting repeat vascular ultrasound to see the status of the clot.  I have reviewed her laboratory tests, and my interpretation is stable anemia, mild elevation of random glucose.  I have reviewed her electrocardiogram, and my interpretation is normal ECG.  She will need to be admitted to ICU for close observation.  CRITICAL CARE Performed by: Dione Booze Total critical care time: 60 minutes Critical care time was exclusive of separately billable procedures and treating other patients. Critical care was necessary to treat or prevent imminent or life-threatening deterioration. Critical care was time spent personally by me on the following activities: development of treatment plan with patient and/or surrogate as well as nursing, discussions with consultants, evaluation of patient's response to treatment, examination of patient, obtaining history from patient or surrogate, ordering and performing treatments and interventions, ordering and review of laboratory studies, ordering and review of radiographic studies, pulse oximetry and re-evaluation of patient's condition.  Final Clinical Impression(s) / ED Diagnoses Final diagnoses:  None    Rx / DC Orders ED Discharge Orders     None         Dione Booze, MD 07/26/23 [phone]

## 2023-07-26 NOTE — Consult Note (Signed)
 Hospital Consult    Reason for Consult: Inability to tolerate anticoagulation status post stroke Requesting Physician: ICU MRN #:  [id number]  History of Present Illness: This is a 68 y.o. 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  She was on dual antiplatelet therapy until 4 days ago when lower extremity ultrasound demonstrated common femoral vein DVT.  She was started on Eliquis.  Patient had a nosebleed overnight, and headache.  She presented to the ED and was found to have a small bleed.  Anticoagulation was stopped.  On exam, glittery was doing well, accompanied by her granddaughter.  Denied sensorimotor deficits in the extremities.  No word finding difficulty, able to converse normally.  Past Medical History:  Diagnosis Date   Anxiety    Carotid stenosis, asymptomatic, bilateral    Cataract    Chronic left shoulder pain    Headache    Hiatal hernia    small   Hyperlipidemia    Hypertension    Osteoporosis    Pre-diabetes    Schatzki's ring    Seasonal allergies    Tremor     Past Surgical History:  Procedure Laterality Date   ABDOMINAL AORTOGRAM W/LOWER EXTREMITY N/A 07/11/2023   Procedure: ABDOMINAL AORTOGRAM W/LOWER EXTREMITY;  Surgeon: Maeola Harman, MD;  Location: [facility] INVASIVE CV LAB;  Service: Cardiovascular;  Laterality: N/A;   CARDIAC CATHETERIZATION  2005   "Ms. Coulston has essentially normal coronary arteries and normal left ventricular function.  She will be treated empirically with anti-reflux measures"   CATARACT EXTRACTION, BILATERAL     CHOLECYSTECTOMY     COLONOSCOPY    06/16/2004   NWG:NFAOZH rectum/Diminutive polyps of the splenic flexure and the cecum/The remainder of the colonic mucosa appeared normal pathology (hyperplastic polyps).   COLONOSCOPY N/A 08/07/2015   Dr. Jena Gauss: 6 mm descending colon tubular adenoma,  multiple medium-mouthed diverticula in sigmoid colon. surveillance 2022   COLONOSCOPY N/A 04/15/2021   Procedure: COLONOSCOPY;  Surgeon: Corbin Ade, MD;  Location: [facility];  Service: Endoscopy;  Laterality: N/A;  9:30am   ESOPHAGOGASTRODUODENOSCOPY  09/11/2001   YQM:VHQIONGEX ring otherwise normal esophagus/Normal stomach/Normal D1 and D2 status post passage of a 56 French Maloney dilator   ESOPHAGOGASTRODUODENOSCOPY N/A 11/11/2017   Procedure: ESOPHAGOGASTRODUODENOSCOPY (EGD);  Surgeon: Corbin Ade, MD; mild Schatzki ring s/p dilation and mild erosive gastropathy.   ESOPHAGOGASTRODUODENOSCOPY (EGD) WITH ESOPHAGEAL DILATION N/A 01/25/2013   Dr. Jena Gauss- schatzki's ring- 54 F dilation. small hiatal hernia   HYSTEROSCOPY WITH D & C N/A 03/06/2019   Procedure: DILATATION AND CURETTAGE /HYSTEROSCOPY;  Surgeon: Tilda Burrow, MD;  Location: [facility] OR[facility];  Service: Gynecology;  Laterality: N/A;   MALONEY DILATION N/A 11/11/2017   Procedure: Elease Hashimoto DILATION;  Surgeon: Corbin Ade, MD;  Location: [facility];  Service: Endoscopy;  Laterality: N/A;   PERIPHERAL VASCULAR BALLOON ANGIOPLASTY Left 07/11/2023   Procedure: PERIPHERAL VASCULAR BALLOON ANGIOPLASTY;  Surgeon: Maeola Harman, MD;  Location: [facility] INVASIVE CV LAB;  Service: Cardiovascular;  Laterality: Left;  External Iliac   PERIPHERAL VASCULAR INTERVENTION Bilateral 07/11/2023   Procedure: PERIPHERAL VASCULAR INTERVENTION;  Surgeon: Maeola Harman, MD;  Location: [facility] INVASIVE CV LAB;  Service: Cardiovascular;  Laterality: Bilateral;  Bilateral Common Iliac; L External Iliac   POLYPECTOMY N/A 03/06/2019   Procedure: POLYPECTOMY ( REMOVAL OF ENDOMETRIAL POLYP);  Surgeon: Tilda Burrow, MD;  Location: [facility];  Service: Gynecology;  Laterality: N/A;   POLYPECTOMY  04/15/2021   Procedure: POLYPECTOMY;  Surgeon: Corbin Ade, MD;  Location: [facility];  Service: Endoscopy;;   TUBAL LIGATION      Allergies  Allergen  Reactions   Ciprofloxacin Shortness Of Breath   Latex Other (See Comments)    Burns skin   Vitamin C [Bioflavonoid Products] Itching    Prior to Admission medications   Medication Sig Start Date End Date Taking? Authorizing Provider  acetaminophen (TYLENOL) 500 MG tablet Take 500 mg by mouth every 6 (six) hours as needed for moderate pain or headache.    [provider]  alendronate (FOSAMAX) 70 MG tablet TAKE 1 TABLET(70 MG) BY MOUTH EVERY 7 DAYS WITH A FULL GLASS OF WATER AND ON AN EMPTY STOMACH 07/06/23   [name], MD  ALPRAZolam Prudy Feeler) 0.5 MG tablet TAKE 1 TABLET(0.5 MG) BY MOUTH TWICE DAILY AS NEEDED FOR ANXIETY 07/15/23   [name], MD  apixaban (ELIQUIS) 5 MG TABS tablet Take 1 tablet (5 mg total) by mouth 2 (two) times daily. 08/19/23   [name], MD  APIXABAN Everlene Balls) VTE STARTER PACK (10MG  AND 5MG ) Take as directed on package: start with two-5mg  tablets twice daily for 7 days. On day 8, switch to one-5mg  tablet twice daily. 07/19/23 08/18/23  Emilie Rutter, PA-C  aspirin EC 81 MG tablet Take 81 mg by mouth at bedtime.     [provider]  carvedilol (COREG) 3.125 MG tablet TAKE 1 TABLET(3.125 MG) BY MOUTH TWICE DAILY 05/13/23   Gaston Islam., NP  Cholecalciferol (VITAMIN D) 50 MCG (2000 UT) CAPS Take 2,000 Units by mouth daily.    [provider]  clopidogrel (PLAVIX) 75 MG tablet Take 1 tablet (75 mg total) by mouth daily. 07/11/23 07/10/24  Maeola Harman, MD  Coenzyme Q10 (COQ10) 100 MG CAPS Take 100 mg by mouth daily.    [provider]  CRANBERRY PO Take 1 each by mouth 2 (two) times a week. Chewables    [provider]  Cyanocobalamin (B-12) 2500 MCG TABS Take 2,500 mcg by mouth daily.    [provider]  dexlansoprazole (DEXILANT) 60 MG capsule Take 1 capsule (60 mg total) by mouth daily. 05/18/22   [name], [name] Friends, MD  EPINEPHrine 0.3 mg/0.3 mL IJ SOAJ injection Inject 0.3 mg into the muscle  as needed for anaphylaxis. 03/12/22   [name], MD  fluticasone (FLONASE) 50 MCG/ACT nasal spray Place 2 sprays into both nostrils daily as needed for allergies. 01/23/23   [provider]  hydroquinone 4 % cream Apply 1 application topically 2 (two) times daily as needed (dark spots). 01/05/21   [provider]  hydrOXYzine (VISTARIL) 25 MG capsule TAKE 1 CAPSULE(25 MG) BY MOUTH EVERY 8 HOURS AS NEEDED 09/21/22   [name], MD  ipratropium (ATROVENT) 0.03 % nasal spray INSTILL 2 SPRAYS IN EACH NOSTRIL EVERY 12 HOURS Patient taking differently: Place 2 sprays into both nostrils 2 (two) times daily as needed for rhinitis. 04/17/21   [name], MD  loratadine (CLARITIN) 10 MG tablet Take 10 mg by mouth daily as needed for allergies.    [provider]  MAGNESIUM PO Take 330 mg by mouth daily.    [provider]  Omega-3 Fatty Acids (FISH OIL) 1000 MG CAPS Take 1,000 mg by mouth 3 (three) times a week.    [provider]  ondansetron [facility])  4 MG tablet TAKE 1 TABLET(4 MG) BY MOUTH EVERY 8 HOURS AS NEEDED FOR NAUSEA OR VOMITING 03/06/21   [name], PA-C  Plecanatide (TRULANCE) 3 MG TABS Take 3 mg by mouth daily. 05/18/22   [name], [name] Friends, MD  rosuvastatin (CRESTOR) 20 MG tablet Take 1 tablet (20 mg total) by mouth daily. 07/22/23   [name], MD  sodium chloride (OCEAN) 0.65 % SOLN nasal spray Place 1 spray into both nostrils as needed for congestion.    [provider]  triamcinolone cream (KENALOG) 0.1 % Apply 1 Application topically 2 (two) times daily. Patient taking differently: Apply 1 Application topically 2 (two) times daily as needed (irritation). 10/29/21   [name], MD  vitamin E 400 UNIT capsule Take 400 Units by mouth 3 (three) times a week.    [provider]  zinc gluconate 50 MG tablet Take 50 mg by mouth 3 (three) times a week.    [provider]    Social History    Socioeconomic History   Marital status: Single    Spouse name: Not on file   Number of children: 3   Years of education: Not on file   Highest education level: 10th grade  Occupational History   Occupation: Public affairs consultant: [employer]  Tobacco Use   Smoking status: Former    Current packs/day: 0.00    Average packs/day: 0.3 packs/day for 35.0 years (8.8 ttl pk-yrs)    Types: Cigarettes    Start date: 12/31/1977    Quit date: 12/31/2012    Years since quitting: 10.5   Smokeless tobacco: Never  Vaping Use   Vaping status: Never Used  Substance and Sexual Activity   Alcohol use: Not Currently   Drug use: No   Sexual activity: Not Currently    Partners: Male    Birth control/protection: Post-menopausal, Surgical    Comment: tubal  Other Topics Concern   Not on file  Social History Narrative   Eats all food groups.    Wears seatbelt.    Has 3 children.   Prior smoker.   Attends church.    Used to work in Designer, fashion/clothing.    Drives.   Enjoys going out with family.    Right handed   One story home   Social Drivers of Health   Financial Resource Strain: Low Risk  (06/02/2023)   Overall Financial Resource Strain (CARDIA)    Difficulty of Paying Living Expenses: Not very hard  Food Insecurity: No Food Insecurity (07/18/2023)   Hunger Vital Sign    Worried About Running Out of Food in the Last Year: Never true    Ran Out of Food in the Last Year: Never true  Transportation Needs: No Transportation Needs (07/18/2023)   PRAPARE - Administrator, Civil Service (Medical): No    Lack of Transportation (Non-Medical): No  Physical Activity: Sufficiently Active (06/02/2023)   Exercise Vital Sign    Days of Exercise per Week: 2 days    Minutes of Exercise per Session: 90 min  Recent Concern: Physical Activity - Insufficiently Active (03/18/2023)   Exercise Vital Sign    Days of Exercise per Week: 3 days    Minutes of Exercise per Session: 30 min  Stress:  Stress Concern Present (06/02/2023)   [employer] of Occupational Health - Occupational Stress Questionnaire    Feeling of Stress : To some extent  Social Connections: Moderately Integrated (07/12/2023)  Social Advertising account executive [NHANES]    Frequency of Communication with Friends and Family: More than three times a week    Frequency of Social Gatherings with Friends and Family: More than three times a week    Attends Religious Services: More than 4 times per year    Active Member of [employer] or Organizations: Yes    Attends Banker Meetings: 1 to 4 times per year    Marital Status: Divorced  Catering manager Violence: Not At Risk (07/18/2023)   Humiliation, Afraid, Rape, and Kick questionnaire    Fear of Current or Ex-Partner: No    Emotionally Abused: No    Physically Abused: No    Sexually Abused: No   Family History  Problem Relation Age of Onset   Diabetes Other    Diabetes Mother    Heart disease Mother    Heart disease Father    Diabetes Sister    Diabetes Brother    Hypertension Brother    Diabetes Daughter    Hypertension Maternal Grandmother    Stroke Maternal Grandfather    Early death Paternal Grandfather    Colon cancer Neg Hx    Liver disease Neg Hx     ROS: Otherwise negative unless mentioned in HPI  Physical Examination  Vitals:   07/26/23 0649 07/26/23 0655  BP: (!) 141/70   Pulse:    Resp:    Temp:  98.4 F (36.9 C)  SpO2:     Body mass index is 19.23 kg/m.  General:  WDWN in NAD Gait: Not observed HENT: WNL, normocephalic Pulmonary: normal non-labored breathing, without Rales, rhonchi,  wheezing Cardiac: regular Abdomen:  soft, NT/ND, no masses Skin: without rashes Vascular Exam/Pulses: Extremities warm and well-perfused Extremities: without ischemic changes, without Gangrene , without cellulitis; without open wounds;  Musculoskeletal: no muscle wasting or atrophy  Neurologic: A&O X 3;  No focal weakness or  paresthesias are detected; speech is fluent/normal Psychiatric:  The pt has Normal affect. Lymph:  Unremarkable  CBC    Component Value Date/Time   WBC 6.9 07/26/2023 0527   RBC 3.83 (L) 07/26/2023 0527   HGB 11.6 (L) 07/26/2023 0655   HGB 12.8 11/16/2012 0000   HCT 34.0 (L) 07/26/2023 0655   HCT 38 11/16/2012 0000   PLT 399 07/26/2023 0527   MCV 91.6 07/26/2023 0527   MCV 86.2 11/16/2012 0000   MCH 30.0 07/26/2023 0527   MCHC 32.8 07/26/2023 0527   RDW 12.4 07/26/2023 0527   LYMPHSABS 2.6 07/26/2023 0527   MONOABS 0.4 07/26/2023 0527   EOSABS 0.3 07/26/2023 0527   BASOSABS 0.0 07/26/2023 0527    BMET    Component Value Date/Time   NA 140 07/26/2023 0655   NA 141 11/16/2012 0000   K 4.0 07/26/2023 0655   K 4.1 11/16/2012 0000   CL 107 07/26/2023 0655   CO2 22 07/26/2023 0559   GLUCOSE 110 (H) 07/26/2023 0655   BUN 5 (L) 07/26/2023 0655   BUN 11 11/16/2012 0000   CREATININE 0.80 07/26/2023 0655   CREATININE 0.87 07/22/2023 1030   CALCIUM 8.6 (L) 07/26/2023 0559   CALCIUM 9.7 11/16/2012 0000   GFRNONAA >60 07/26/2023 0559   GFRNONAA 64 06/10/2020 1541   GFRAA 74 06/10/2020 1541    COAGS: Lab Results  Component Value Date   INR 1.2 07/26/2023   INR 1.01 09/15/2014      ASSESSMENT/PLAN: This is a 68 y.o. female with history of bleed on triple  therapy.  Patient unable to tolerate anticoagulation at this time.  She would benefit from inferior vena cava filter to prevent pulmonary embolism from known DVT.  After discussing the risks and benefits, Nkenge elected to proceed. I called her daughter and discussed the above.  She was also okay proceeding.   Victorino Sparrow MD [facility] [phone] 07/26/2023  9:15 AM

## 2023-07-26 NOTE — ED Triage Notes (Signed)
 Patient reports headache that started last night at approx 11pm and intermittent nose bleeds that started at 12am. On eliquis and Plavix. Denies weakness, numbness. Recently discharged after having stents placed in iliac arteries and ischemic stroke. Baseline visual disturbance in left eye from stroke- patient reports it may be slightly more blurry than normal.

## 2023-07-26 NOTE — H&P (Signed)
 NEUROLOGY H&P NOTE   Date of service: July 26, 2023 Patient Name: [name] MRN:  [id number] DOB:  [birth year] Chief Complaint: "Nosebleed, headache"  History of Present Illness  [name] is a 68 y.o. female with hx of CAD, stenting of bilateral common iliac arteries done on 07/11/2023 followed by code stroke activation where a right PCA stroke was found, was on dual antiplatelets up until 4 days ago when a lower extremity ultrasound of the groin site led to the discovery of a CFA DVT and she was started on Eliquis in addition to dual antiplatelets, presenting to the emergency department for evaluation of sudden onset of headache and nosebleeds.  Reports that this started last night-both the nosebleed from the right nare and the mild headache which has been continuous since. Denies any visual changes.  Denies tingling numbness weakness. Last dose Eliquis 7 PM on 07/25/2023  Last known well: 11 PM 07/25/2023 Modified rankin score: 1-No significant post stroke disability and can perform usual duties with stroke symptoms IV Thrombolysis: Hemorrhagic transformation of the known stroke from 07/11/2023 ICH score-0 NIHSS components Score: Comment  1a Level of Conscious 0[x]  1[]  2[]  3[]      1b LOC Questions 0[x]  1[]  2[]       1c LOC Commands 0[x]  1[]  2[]       2 Best Gaze 0[x]  1[]  2[]       3 Visual 0[]  1[x]  2[]  3[]      4 Facial Palsy 0[x]  1[]  2[]  3[]      5a Motor Arm - left 0[x]  1[]  2[]  3[]  4[]  UN[]    5b Motor Arm - Right 0[x]  1[]  2[]  3[]  4[]  UN[]    6a Motor Leg - Left 0[x]  1[]  2[]  3[]  4[]  UN[]    6b Motor Leg - Right 0[x]  1[]  2[]  3[]  4[]  UN[]    7 Limb Ataxia 0[x]  1[]  2[]  3[]  UN[]     8 Sensory 0[x]  1[]  2[]  UN[]      9 Best Language 0[x]  1[]  2[]  3[]      10 Dysarthria 0[x]  1[]  2[]  UN[]      11 Extinct. and Inattention 0[x]  1[]  2[]       TOTAL: 1      ROS  Comprehensive ROS performed and pertinent positives documented in the HPI  Past History   Past Medical History:  Diagnosis  Date   Anxiety    Carotid stenosis, asymptomatic, bilateral    Cataract    Chronic left shoulder pain    Headache    Hiatal hernia    small   Hyperlipidemia    Hypertension    Osteoporosis    Pre-diabetes    Schatzki's ring    Seasonal allergies    Tremor    Past Surgical History:  Procedure Laterality Date   ABDOMINAL AORTOGRAM W/LOWER EXTREMITY N/A 07/11/2023   Procedure: ABDOMINAL AORTOGRAM W/LOWER EXTREMITY;  Surgeon: Maeola Harman, MD;  Location: [facility] INVASIVE CV LAB;  Service: Cardiovascular;  Laterality: N/A;   CARDIAC CATHETERIZATION  2005   "[name] has essentially normal coronary arteries and normal left ventricular function.  She will be treated empirically with anti-reflux measures"   CATARACT EXTRACTION, BILATERAL     CHOLECYSTECTOMY     COLONOSCOPY    06/16/2004   [id number] rectum/Diminutive polyps of the splenic flexure and the cecum/The remainder of the colonic mucosa appeared normal pathology (hyperplastic polyps).   COLONOSCOPY N/A 08/07/2015   Dr. Jena Gauss: 6 mm descending colon tubular adenoma, multiple medium-mouthed diverticula in sigmoid colon. surveillance 2022   COLONOSCOPY N/A  04/15/2021   Procedure: COLONOSCOPY;  Surgeon: Corbin Ade, MD;  Location: [facility];  Service: Endoscopy;  Laterality: N/A;  9:30am   ESOPHAGOGASTRODUODENOSCOPY  09/11/2001   [id number] ring otherwise normal esophagus/Normal stomach/Normal D1 and D2 status post passage of a 56 French Maloney dilator   ESOPHAGOGASTRODUODENOSCOPY N/A 11/11/2017   Procedure: ESOPHAGOGASTRODUODENOSCOPY (EGD);  Surgeon: Corbin Ade, MD; mild Schatzki ring s/p dilation and mild erosive gastropathy.   ESOPHAGOGASTRODUODENOSCOPY (EGD) WITH ESOPHAGEAL DILATION N/A 01/25/2013   Dr. Jena Gauss- schatzki's ring- 54 F dilation. small hiatal hernia   HYSTEROSCOPY WITH D & C N/A 03/06/2019   Procedure: DILATATION AND CURETTAGE /HYSTEROSCOPY;  Surgeon: Tilda Burrow, MD;  Location: [facility] ORS;   Service: Gynecology;  Laterality: N/A;   MALONEY DILATION N/A 11/11/2017   Procedure: Elease Hashimoto DILATION;  Surgeon: Corbin Ade, MD;  Location: [facility];  Service: Endoscopy;  Laterality: N/A;   PERIPHERAL VASCULAR BALLOON ANGIOPLASTY Left 07/11/2023   Procedure: PERIPHERAL VASCULAR BALLOON ANGIOPLASTY;  Surgeon: Maeola Harman, MD;  Location: [facility] INVASIVE CV LAB;  Service: Cardiovascular;  Laterality: Left;  External Iliac   PERIPHERAL VASCULAR INTERVENTION Bilateral 07/11/2023   Procedure: PERIPHERAL VASCULAR INTERVENTION;  Surgeon: Maeola Harman, MD;  Location: [facility] INVASIVE CV LAB;  Service: Cardiovascular;  Laterality: Bilateral;  Bilateral Common Iliac; L External Iliac   POLYPECTOMY N/A 03/06/2019   Procedure: POLYPECTOMY ( REMOVAL OF ENDOMETRIAL POLYP);  Surgeon: Tilda Burrow, MD;  Location: [facility];  Service: Gynecology;  Laterality: N/A;   POLYPECTOMY  04/15/2021   Procedure: POLYPECTOMY;  Surgeon: Corbin Ade, MD;  Location: [facility];  Service: Endoscopy;;   TUBAL LIGATION     Family History  Problem Relation Age of Onset   Diabetes Other    Diabetes Mother    Heart disease Mother    Heart disease Father    Diabetes Sister    Diabetes Brother    Hypertension Brother    Diabetes Daughter    Hypertension Maternal Grandmother    Stroke Maternal Grandfather    Early death Paternal Grandfather    Colon cancer Neg Hx    Liver disease Neg Hx    Social History   Socioeconomic History   Marital status: Single    Spouse name: Not on file   Number of children: 3   Years of education: Not on file   Highest education level: 10th grade  Occupational History   Occupation: Public affairs consultant: [employer]  Tobacco Use   Smoking status: Former    Current packs/day: 0.00    Average packs/day: 0.3 packs/day for 35.0 years (8.8 ttl pk-yrs)    Types: Cigarettes    Start date: 12/31/1977    Quit date: 12/31/2012    Years since quitting:  10.5   Smokeless tobacco: Never  Vaping Use   Vaping status: Never Used  Substance and Sexual Activity   Alcohol use: Not Currently   Drug use: No   Sexual activity: Not Currently    Partners: Male    Birth control/protection: Post-menopausal, Surgical    Comment: tubal  Other Topics Concern   Not on file  Social History Narrative   Eats all food groups.    Wears seatbelt.    Has 3 children.   Prior smoker.   Attends church.    Used to work in Designer, fashion/clothing.    Drives.   Enjoys going out with family.    Right handed   One story  home   Social Drivers of Health   Financial Resource Strain: Low Risk  (06/02/2023)   Overall Financial Resource Strain (CARDIA)    Difficulty of Paying Living Expenses: Not very hard  Food Insecurity: No Food Insecurity (07/18/2023)   Hunger Vital Sign    Worried About Running Out of Food in the Last Year: Never true    Ran Out of Food in the Last Year: Never true  Transportation Needs: No Transportation Needs (07/18/2023)   PRAPARE - Administrator, Civil Service (Medical): No    Lack of Transportation (Non-Medical): No  Physical Activity: Sufficiently Active (06/02/2023)   Exercise Vital Sign    Days of Exercise per Week: 2 days    Minutes of Exercise per Session: 90 min  Recent Concern: Physical Activity - Insufficiently Active (03/18/2023)   Exercise Vital Sign    Days of Exercise per Week: 3 days    Minutes of Exercise per Session: 30 min  Stress: Stress Concern Present (06/02/2023)   [employer] of Occupational Health - Occupational Stress Questionnaire    Feeling of Stress : To some extent  Social Connections: Moderately Integrated (07/12/2023)   Social Connection and Isolation Panel [NHANES]    Frequency of Communication with Friends and Family: More than three times a week    Frequency of Social Gatherings with Friends and Family: More than three times a week    Attends Religious Services: More than 4 times per year    Active  Member of [employer] or Organizations: Yes    Attends Banker Meetings: 1 to 4 times per year    Marital Status: Divorced   Allergies  Allergen Reactions   Ciprofloxacin Shortness Of Breath   Latex Other (See Comments)    Burns skin   Vitamin C [Bioflavonoid Products] Itching    Medications   Current Facility-Administered Medications:    [START ON 07/27/2023]  stroke: early stages of recovery book, , Does not apply, Once, Milon Dikes, MD   acetaminophen (TYLENOL) tablet 650 mg, 650 mg, Oral, Q4H PRN **OR** acetaminophen (TYLENOL) 160 MG/5ML solution 650 mg, 650 mg, Per Tube, Q4H PRN **OR** acetaminophen (TYLENOL) suppository 650 mg, 650 mg, Rectal, Q4H PRN, Milon Dikes, MD   clevidipine (CLEVIPREX) infusion 0.5 mg/mL, 0-21 mg/hr, Intravenous, Continuous, Humes, Kelly, PA-C   hydrALAZINE (APRESOLINE) injection 10 mg, 10 mg, Intravenous, Once PRN, Dione Booze, MD   labetalol (NORMODYNE) injection 10 mg, 10 mg, Intravenous, Q2H PRN, Dione Booze, MD   nitroGLYCERIN (NITROSTAT) SL tablet 0.4 mg, 0.4 mg, Sublingual, Once,    pantoprazole (PROTONIX) injection 40 mg, 40 mg, Intravenous, QHS, Milon Dikes, MD   senna-docusate (Senokot-S) tablet 1 tablet, 1 tablet, Oral, BID, Milon Dikes, MD  Current Outpatient Medications:    acetaminophen (TYLENOL) 500 MG tablet, Take 500 mg by mouth every 6 (six) hours as needed for moderate pain or headache., Disp: , Rfl:    alendronate (FOSAMAX) 70 MG tablet, TAKE 1 TABLET(70 MG) BY MOUTH EVERY 7 DAYS WITH A FULL GLASS OF WATER AND ON AN EMPTY STOMACH, Disp: 4 tablet, Rfl: 11   ALPRAZolam (XANAX) 0.5 MG tablet, TAKE 1 TABLET(0.5 MG) BY MOUTH TWICE DAILY AS NEEDED FOR ANXIETY, Disp: 180 tablet, Rfl: 0   [START ON 08/19/2023] apixaban (ELIQUIS) 5 MG TABS tablet, Take 1 tablet (5 mg total) by mouth 2 (two) times daily., Disp: 60 tablet, Rfl: 1   APIXABAN (ELIQUIS) VTE STARTER PACK (10MG  AND 5MG ), Take as directed on package:  start with two-5mg   tablets twice daily for 7 days. On day 8, switch to one-5mg  tablet twice daily., Disp: 74 tablet, Rfl: 0   aspirin EC 81 MG tablet, Take 81 mg by mouth at bedtime. , Disp: , Rfl:    carvedilol (COREG) 3.125 MG tablet, TAKE 1 TABLET(3.125 MG) BY MOUTH TWICE DAILY, Disp: 180 tablet, Rfl: 0   Cholecalciferol (VITAMIN D) 50 MCG (2000 UT) CAPS, Take 2,000 Units by mouth daily., Disp: , Rfl:    clopidogrel (PLAVIX) 75 MG tablet, Take 1 tablet (75 mg total) by mouth daily., Disp: 30 tablet, Rfl: 11   Coenzyme Q10 (COQ10) 100 MG CAPS, Take 100 mg by mouth daily., Disp: , Rfl:    CRANBERRY PO, Take 1 each by mouth 2 (two) times a week. Chewables, Disp: , Rfl:    Cyanocobalamin (B-12) 2500 MCG TABS, Take 2,500 mcg by mouth daily., Disp: , Rfl:    dexlansoprazole (DEXILANT) 60 MG capsule, Take 1 capsule (60 mg total) by mouth daily., Disp: 30 capsule, Rfl: 11   EPINEPHrine 0.3 mg/0.3 mL IJ SOAJ injection, Inject 0.3 mg into the muscle as needed for anaphylaxis., Disp: 1 each, Rfl: 1   fluticasone (FLONASE) 50 MCG/ACT nasal spray, Place 2 sprays into both nostrils daily as needed for allergies., Disp: , Rfl:    hydroquinone 4 % cream, Apply 1 application topically 2 (two) times daily as needed (dark spots)., Disp: , Rfl:    hydrOXYzine (VISTARIL) 25 MG capsule, TAKE 1 CAPSULE(25 MG) BY MOUTH EVERY 8 HOURS AS NEEDED, Disp: 90 capsule, Rfl: 2   ipratropium (ATROVENT) 0.03 % nasal spray, INSTILL 2 SPRAYS IN EACH NOSTRIL EVERY 12 HOURS (Patient taking differently: Place 2 sprays into both nostrils 2 (two) times daily as needed for rhinitis.), Disp: 30 mL, Rfl: 12   loratadine (CLARITIN) 10 MG tablet, Take 10 mg by mouth daily as needed for allergies., Disp: , Rfl:    MAGNESIUM PO, Take 330 mg by mouth daily., Disp: , Rfl:    Omega-3 Fatty Acids (FISH OIL) 1000 MG CAPS, Take 1,000 mg by mouth 3 (three) times a week., Disp: , Rfl:    ondansetron (ZOFRAN) 4 MG tablet, TAKE 1 TABLET(4 MG) BY MOUTH EVERY 8 HOURS AS  NEEDED FOR NAUSEA OR VOMITING, Disp: 30 tablet, Rfl: 0   Plecanatide (TRULANCE) 3 MG TABS, Take 3 mg by mouth daily., Disp: 30 tablet, Rfl: 11   rosuvastatin (CRESTOR) 20 MG tablet, Take 1 tablet (20 mg total) by mouth daily., Disp: 90 tablet, Rfl: 3   sodium chloride (OCEAN) 0.65 % SOLN nasal spray, Place 1 spray into both nostrils as needed for congestion., Disp: , Rfl:    triamcinolone cream (KENALOG) 0.1 %, Apply 1 Application topically 2 (two) times daily. (Patient taking differently: Apply 1 Application topically 2 (two) times daily as needed (irritation).), Disp: 30 g, Rfl: 0   vitamin E 400 UNIT capsule, Take 400 Units by mouth 3 (three) times a week., Disp: , Rfl:    zinc gluconate 50 MG tablet, Take 50 mg by mouth 3 (three) times a week., Disp: , Rfl:    Vitals   Vitals:   07/26/23 0505 07/26/23 0550 07/26/23 0600 07/26/23 0606  BP: (!) 167/100 (!) 188/75 (!) 180/76 (!) 145/62  Pulse: 92 80 71 64  Resp: 16  15 15   Temp: 98.1 F (36.7 C)     TempSrc: Oral     SpO2: 99% 99% 99% 99%  Weight:  Height:         Body mass index is 25.37 kg/m.  Physical Exam   GENERAL: Awake, alert in NAD HEENT: - Normocephalic and atraumatic, dry mm, no LN++, no Thyromegally LUNGS - Clear to auscultation bilaterally with no wheezes CV - S1S2 RRR, no m/r/g, equal pulses bilaterally. ABDOMEN - Soft, nontender, nondistended with normoactive BS NEURO:  Mental Status: AA&Ox3 Speech and Language: speech is nondysarthric.  Naming, repetition, fluency, and comprehension intact. Cranial Nerves: PERRL. EOMI, visual fields examination shows left superior quadrantanopsia which is according to the patient better than 2 weeks ago, no facial asymmetry, facial sensation intact, hearing intact, tongue/uvula/soft palate midline, normal sternocleidomastoid and trapezius muscle strength. No evidence of tongue atrophy or fibrillations Motor: No drift Tone: is normal and bulk is normal Sensation- Intact to  light touch bilaterally Coordination: FTN intact bilaterally, no ataxia in BLE. Gait- deferred   Labs   CBC:  Recent Labs  Lab 07/22/23 1030 07/26/23 0527  WBC 6.8 6.9  NEUTROABS 3,618 3.6  HGB 10.8* 11.5*  HCT 33.0* 35.1*  MCV 93.0 91.6  PLT 407* 399   Basic Metabolic Panel:  Lab Results  Component Value Date   NA 140 07/22/2023   K 4.1 07/22/2023   CO2 27 07/22/2023   GLUCOSE 97 07/22/2023   BUN 9 07/22/2023   CREATININE 0.87 07/22/2023   CALCIUM 8.9 07/22/2023   GFRNONAA >60 07/12/2023   GFRAA 74 06/10/2020   Lipid Panel:  Lab Results  Component Value Date   LDLCALC 75 07/12/2023   HgbA1c:  Lab Results  Component Value Date   HGBA1C 6.0 (H) 07/12/2023   Urine Drug Screen:     Component Value Date/Time   LABOPIA NONE DETECTED 09/15/2014 1648   COCAINSCRNUR NONE DETECTED 09/15/2014 1648   LABBENZ NONE DETECTED 09/15/2014 1648   AMPHETMU NONE DETECTED 09/15/2014 1648   THCU NONE DETECTED 09/15/2014 1648   LABBARB NONE DETECTED 09/15/2014 1648    Alcohol Level     Component Value Date/Time   ETH <5 09/15/2014 1637   INR  Lab Results  Component Value Date   INR 1.2 07/26/2023   APTT  Lab Results  Component Value Date   APTT 27 09/15/2014     CT Head without contrast(Personally reviewed): Positive for hemorrhagic transformation of the right PCA infarct since 07/12/2023-confluent petechial type rather than malignant intracranial hemorrhage.  No significant intracranial mass effect or other complicating features seen at this time.  Stable extent of the underlying PCA infarct.    Assessment   [name] is a 68 y.o. female past medical history of CAD and peripheral vascular disease with recent stenting of bilateral lower extremities common iliac arteries during which time she sustained a right PCA infarct now presenting with nosebleeds and headache. She was recently started on Eliquis 4 days ago due to discovery of a lower extremity DVT.  She  was kept on her dual antiplatelets for the stents making her high risk for bleed. CT head reveals hemorrhagic transformation without mass effect or midline shift of the right PCA infarct which is new from the prior imaging study of 07/12/2023. At this time, this hemorrhagic transformation is likely secondary to coagulopathy from antiplatelets and anticoagulation  I discussed the case with the ED provider and the pharmacist-her last Eliquis was 7 PM yesterday.  The bleed is not a frank malignant intracranial hemorrhage but rather confluent petechial type.  Given her DVT and prothrombotic state, I think it may  not be in her best interest to reverse her Eliquis.  Will monitor with close follow-up CT head and if there is any evidence of increasing size of the bleed, will then at that time reverse Eliquis if needed.  Mainstay of the treatment will be blood pressure management and holding the antiplatelets and anticoagulants for short duration of time and reassessing.  Impression: Nontraumatic intracerebral hemorrhage involving the right occipital lobe likely secondary to coagulopathy from antiplatelets and anticoagulants   Recommendations  Admit to neuro ICU under the stroke service Frequent neurochecks Telemetry Reassuring clinical exam and petechial type confluent hemorrhage rather than frank malignant ICH-hence holding off on reversal of Eliquis for now.  Repeat CT head at 8:30 AM and if worsening-consider Andexxa for reversal. Check labs and replete electrolytes as necessary Hold Eliquis, aspirin and Plavix for now.  Obtain vascular surgical consultation Blood pressure goal 130-150 systolic.  Given a dose of labetalol, will give a dose of hydralazine, if both are unable to bring the blood pressure down to goal, start Cleviprex drip. Therapy assessments No need to repeat stroke workup-1 was done less than 2 weeks ago. SCDs for DVT prophylaxis Will appreciate vascular surgery evaluation of the  patient-will need them to follow along regarding timeline for starting antiplatelets/anticoagulants and choice of agents.  Full code PPI Docusate senna  Stroke team to continue to follow   ______________________________________________________________________   Signed, Milon Dikes, MD [facility]   CRITICAL CARE ATTESTATION Performed by: Milon Dikes, MD Total critical care time: 39 minutes Critical care time was exclusive of separately billable procedures and treating other patients and/or supervising APPs/Residents/Students Critical care was necessary to treat or prevent imminent or life-threatening deterioration. This patient is critically ill and at significant risk for neurological worsening and/or death and care requires constant monitoring. Critical care was time spent personally by me on the following activities: development of treatment plan with patient and/or surrogate as well as nursing, discussions with consultants, evaluation of patient's response to treatment, examination of patient, obtaining history from patient or surrogate, ordering and performing treatments and interventions, ordering and review of laboratory studies, ordering and review of radiographic studies, pulse oximetry, re-evaluation of patient's condition, participation in multidisciplinary rounds and medical decision making of high complexity in the care of this patient.

## 2023-07-26 NOTE — Transfer of Care (Signed)
 Immediate Anesthesia Transfer of Care Note  Patient: [name]  Procedure(s) Performed: INSERTION, UMBRELLA FILTER, INFERIOR VENA CAVA FEMORAL ARTERY  Patient Location: PACU  Anesthesia Type:MAC  Level of Consciousness: awake, alert , oriented, and patient cooperative  Airway & Oxygen Therapy: Patient Spontanous Breathing  Post-op Assessment: Report given to RN and Post -op Vital signs reviewed and stable  Post vital signs: Reviewed and stable  Last Vitals:  Vitals Value Taken Time  BP 144/59 07/26/23 1530  Temp 36.6 C 07/26/23 1530  Pulse 61 07/26/23 1532  Resp 15 07/26/23 1532  SpO2 99 % 07/26/23 1532  Vitals shown include unfiled device data.  Last Pain:  Vitals:   07/26/23 1400  TempSrc:   PainSc: 0-No pain         Complications: No notable events documented.

## 2023-07-26 NOTE — Plan of Care (Signed)
  Problem: Education: Goal: Knowledge of disease or condition will improve Outcome: Progressing Goal: Knowledge of secondary prevention will improve (MUST DOCUMENT ALL) Outcome: Progressing Goal: Knowledge of patient specific risk factors will improve (DELETE if not current risk factor) Outcome: Progressing   Problem: Intracerebral Hemorrhage Tissue Perfusion: Goal: Complications of Intracerebral Hemorrhage will be minimized Outcome: Progressing   Problem: Coping: Goal: Will verbalize positive feelings about self Outcome: Progressing Goal: Will identify appropriate support needs Outcome: Progressing   Problem: Health Behavior/Discharge Planning: Goal: Ability to manage health-related needs will improve Outcome: Progressing Goal: Goals will be collaboratively established with patient/family Outcome: Progressing   Problem: Self-Care: Goal: Ability to participate in self-care as condition permits will improve Outcome: Progressing Goal: Verbalization of feelings and concerns over difficulty with self-care will improve Outcome: Progressing Goal: Ability to communicate needs accurately will improve Outcome: Progressing   Problem: Nutrition: Goal: Risk of aspiration will decrease Outcome: Progressing Goal: Dietary intake will improve Outcome: Progressing   Problem: Education: Goal: Knowledge of General Education information will improve Description: Including pain rating scale, medication(s)/side effects and non-pharmacologic comfort measures Outcome: Progressing   Problem: Health Behavior/Discharge Planning: Goal: Ability to manage health-related needs will improve Outcome: Progressing   Problem: Clinical Measurements: Goal: Ability to maintain clinical measurements within normal limits will improve Outcome: Progressing Goal: Will remain free from infection Outcome: Progressing Goal: Diagnostic test results will improve Outcome: Progressing Goal: Respiratory  complications will improve Outcome: Progressing Goal: Cardiovascular complication will be avoided Outcome: Progressing   Problem: Activity: Goal: Risk for activity intolerance will decrease Outcome: Progressing   Problem: Nutrition: Goal: Adequate nutrition will be maintained Outcome: Progressing   Problem: Coping: Goal: Level of anxiety will decrease Outcome: Progressing   Problem: Elimination: Goal: Will not experience complications related to urinary retention Outcome: Progressing   Problem: Pain Managment: Goal: General experience of comfort will improve and/or be controlled Outcome: Progressing   Problem: Safety: Goal: Ability to remain free from injury will improve Outcome: Progressing   Problem: Skin Integrity: Goal: Risk for impaired skin integrity will decrease Outcome: Progressing   Problem: Elimination: Goal: Will not experience complications related to bowel motility Outcome: Not Progressing

## 2023-07-26 NOTE — Anesthesia Preprocedure Evaluation (Addendum)
 Anesthesia Evaluation  Patient identified by MRN, date of birth, ID band Patient awake    Reviewed: Allergy & Precautions, H&P , NPO status , Patient's Chart, lab work & pertinent test results, reviewed documented beta blocker date and time   Airway Mallampati: II  TM Distance: >3 FB Neck ROM: Full    Dental no notable dental hx. (+) Edentulous Upper, Edentulous Lower, Dental Advisory Given   Pulmonary former smoker   Pulmonary exam normal breath sounds clear to auscultation       Cardiovascular hypertension, Pt. on medications and Pt. on home beta blockers + Peripheral Vascular Disease   Rhythm:Regular Rate:Normal     Neuro/Psych  Headaches  Anxiety     CVA    GI/Hepatic Neg liver ROS, hiatal hernia,GERD  Medicated,,  Endo/Other  diabetes, Type 2, Oral Hypoglycemic Agents    Renal/GU negative Renal ROS  negative genitourinary   Musculoskeletal   Abdominal   Peds  Hematology negative hematology ROS (+)   Anesthesia Other Findings   Reproductive/Obstetrics negative OB ROS                             Anesthesia Physical Anesthesia Plan  ASA: 3  Anesthesia Plan: MAC   Post-op Pain Management: Tylenol PO (pre-op)*   Induction: Intravenous  PONV Risk Score and Plan: 4 or greater and Ondansetron, Dexamethasone and Treatment may vary due to age or medical condition  Airway Management Planned: Natural Airway and Simple Face Mask  Additional Equipment:   Intra-op Plan:   Post-operative Plan:   Informed Consent: I have reviewed the patients History and Physical, chart, labs and discussed the procedure including the risks, benefits and alternatives for the proposed anesthesia with the patient or authorized representative who has indicated his/her understanding and acceptance.     Dental advisory given  Plan Discussed with: CRNA  Anesthesia Plan Comments:        Anesthesia  Quick Evaluation

## 2023-07-26 NOTE — Op Note (Signed)
    Patient name: [name] MRN: [id number] DOB: 07-Sep-1955 Sex: female  07/26/2023 Pre-operative Diagnosis: DVT with contraindication anticoagulation Post-operative diagnosis:  Same Surgeon:  Apolinar Junes C. Randie Heinz, MD Procedure Performed: 1.  Percutaneous ultrasound-guided access right common femoral vein 2.  IVC venogram 3.  Placement of infrarenal Cook Celect IVC filter  Indications: 68 year old female with recent history of bilateral common iliac artery stenting performed for right lower extremity ischemic rest pain.  This was complicated by ischemic stroke resulting in visual field deficit and she is definitely found to have a left common femoral vein DVT for which she was placed on Eliquis.  She is now admitted with hemorrhage in the right occipital lobe with contraindication to anticoagulation and she is indicated for IVC filter placement.  Findings:  IVC filter placed infrarenal position, IVC measured less than 28 mm transverse diameter   Procedure:  The patient was identified in the holding area and taken to the operating room where she was placed upon operative when MAC anesthesia was induced.  She was gently prepped and draped in the bilateral groins in usual fashion, antibiotics were administered a timeout was called.  Ultrasound was used to identify the right common femoral vein which was patent and compressible at the saphenofemoral junction.  The area was anesthetized 1% lidocaine and cannulated with a micropuncture needle followed by wire and the sheath.  A Bentson wire was placed followed by the filter introducer sheath and IVC venogram was performed.  The renal veins in the IVC confluence were then marked on the screen and the filter was placed between the 2.  Completion still image was performed.  The sheath was removed and pressure held over hemostasis was obtained.  Patient tolerated procedure well any complication.  All counts were correct at completion.  Contrast: 10  cc  EBL: 10 cc    Song Myre C. Randie Heinz, MD [facility] Office: [phone] Pager: [phone]

## 2023-07-26 NOTE — H&P (View-Only) (Signed)
 Hospital Consult    Reason for Consult: Inability to tolerate anticoagulation status post stroke Requesting Physician: ICU MRN #:  [id number]  History of Present Illness: This is a 68 y.o. 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  She was on dual antiplatelet therapy until 4 days ago when lower extremity ultrasound demonstrated common femoral vein DVT.  She was started on Eliquis.  Patient had a nosebleed overnight, and headache.  She presented to the ED and was found to have a small bleed.  Anticoagulation was stopped.  On exam, glittery was doing well, accompanied by her granddaughter.  Denied sensorimotor deficits in the extremities.  No word finding difficulty, able to converse normally.  Past Medical History:  Diagnosis Date   Anxiety    Carotid stenosis, asymptomatic, bilateral    Cataract    Chronic left shoulder pain    Headache    Hiatal hernia    small   Hyperlipidemia    Hypertension    Osteoporosis    Pre-diabetes    Schatzki's ring    Seasonal allergies    Tremor     Past Surgical History:  Procedure Laterality Date   ABDOMINAL AORTOGRAM W/LOWER EXTREMITY N/A 07/11/2023   Procedure: ABDOMINAL AORTOGRAM W/LOWER EXTREMITY;  Surgeon: Maeola Harman, MD;  Location: [facility] INVASIVE CV LAB;  Service: Cardiovascular;  Laterality: N/A;   CARDIAC CATHETERIZATION  2005   "Ms. Coulston has essentially normal coronary arteries and normal left ventricular function.  She will be treated empirically with anti-reflux measures"   CATARACT EXTRACTION, BILATERAL     CHOLECYSTECTOMY     COLONOSCOPY    06/16/2004   NWG:NFAOZH rectum/Diminutive polyps of the splenic flexure and the cecum/The remainder of the colonic mucosa appeared normal pathology (hyperplastic polyps).   COLONOSCOPY N/A 08/07/2015   Dr. Jena Gauss: 6 mm descending colon tubular adenoma,  multiple medium-mouthed diverticula in sigmoid colon. surveillance 2022   COLONOSCOPY N/A 04/15/2021   Procedure: COLONOSCOPY;  Surgeon: Corbin Ade, MD;  Location: [facility];  Service: Endoscopy;  Laterality: N/A;  9:30am   ESOPHAGOGASTRODUODENOSCOPY  09/11/2001   YQM:VHQIONGEX ring otherwise normal esophagus/Normal stomach/Normal D1 and D2 status post passage of a 56 French Maloney dilator   ESOPHAGOGASTRODUODENOSCOPY N/A 11/11/2017   Procedure: ESOPHAGOGASTRODUODENOSCOPY (EGD);  Surgeon: Corbin Ade, MD; mild Schatzki ring s/p dilation and mild erosive gastropathy.   ESOPHAGOGASTRODUODENOSCOPY (EGD) WITH ESOPHAGEAL DILATION N/A 01/25/2013   Dr. Jena Gauss- schatzki's ring- 54 F dilation. small hiatal hernia   HYSTEROSCOPY WITH D & C N/A 03/06/2019   Procedure: DILATATION AND CURETTAGE /HYSTEROSCOPY;  Surgeon: Tilda Burrow, MD;  Location: [facility] ORS;  Service: Gynecology;  Laterality: N/A;   MALONEY DILATION N/A 11/11/2017   Procedure: Elease Hashimoto DILATION;  Surgeon: Corbin Ade, MD;  Location: [facility];  Service: Endoscopy;  Laterality: N/A;   PERIPHERAL VASCULAR BALLOON ANGIOPLASTY Left 07/11/2023   Procedure: PERIPHERAL VASCULAR BALLOON ANGIOPLASTY;  Surgeon: Maeola Harman, MD;  Location: [facility] INVASIVE CV LAB;  Service: Cardiovascular;  Laterality: Left;  External Iliac   PERIPHERAL VASCULAR INTERVENTION Bilateral 07/11/2023   Procedure: PERIPHERAL VASCULAR INTERVENTION;  Surgeon: Maeola Harman, MD;  Location: [facility] INVASIVE CV LAB;  Service: Cardiovascular;  Laterality: Bilateral;  Bilateral Common Iliac; L External Iliac   POLYPECTOMY N/A 03/06/2019   Procedure: POLYPECTOMY ( REMOVAL OF ENDOMETRIAL POLYP);  Surgeon: Tilda Burrow, MD;  Location: [facility];  Service: Gynecology;  Laterality: N/A;   POLYPECTOMY  04/15/2021   Procedure: POLYPECTOMY;  Surgeon: Corbin Ade, MD;  Location: [facility];  Service: Endoscopy;;   TUBAL LIGATION      Allergies  Allergen  Reactions   Ciprofloxacin Shortness Of Breath   Latex Other (See Comments)    Burns skin   Vitamin C [Bioflavonoid Products] Itching    Prior to Admission medications   Medication Sig Start Date End Date Taking? Authorizing Provider  acetaminophen (TYLENOL) 500 MG tablet Take 500 mg by mouth every 6 (six) hours as needed for moderate pain or headache.    [provider]  alendronate (FOSAMAX) 70 MG tablet TAKE 1 TABLET(70 MG) BY MOUTH EVERY 7 DAYS WITH A FULL GLASS OF WATER AND ON AN EMPTY STOMACH 07/06/23   [name], MD  ALPRAZolam Prudy Feeler) 0.5 MG tablet TAKE 1 TABLET(0.5 MG) BY MOUTH TWICE DAILY AS NEEDED FOR ANXIETY 07/15/23   [name], MD  apixaban (ELIQUIS) 5 MG TABS tablet Take 1 tablet (5 mg total) by mouth 2 (two) times daily. 08/19/23   [name], MD  APIXABAN Everlene Balls) VTE STARTER PACK (10MG  AND 5MG ) Take as directed on package: start with two-5mg  tablets twice daily for 7 days. On day 8, switch to one-5mg  tablet twice daily. 07/19/23 08/18/23  Emilie Rutter, PA-C  aspirin EC 81 MG tablet Take 81 mg by mouth at bedtime.     [provider]  carvedilol (COREG) 3.125 MG tablet TAKE 1 TABLET(3.125 MG) BY MOUTH TWICE DAILY 05/13/23   Gaston Islam., NP  Cholecalciferol (VITAMIN D) 50 MCG (2000 UT) CAPS Take 2,000 Units by mouth daily.    [provider]  clopidogrel (PLAVIX) 75 MG tablet Take 1 tablet (75 mg total) by mouth daily. 07/11/23 07/10/24  Maeola Harman, MD  Coenzyme Q10 (COQ10) 100 MG CAPS Take 100 mg by mouth daily.    [provider]  CRANBERRY PO Take 1 each by mouth 2 (two) times a week. Chewables    [provider]  Cyanocobalamin (B-12) 2500 MCG TABS Take 2,500 mcg by mouth daily.    [provider]  dexlansoprazole (DEXILANT) 60 MG capsule Take 1 capsule (60 mg total) by mouth daily. 05/18/22   [name], [name] Friends, MD  EPINEPHrine 0.3 mg/0.3 mL IJ SOAJ injection Inject 0.3 mg into the muscle  as needed for anaphylaxis. 03/12/22   [name], MD  fluticasone (FLONASE) 50 MCG/ACT nasal spray Place 2 sprays into both nostrils daily as needed for allergies. 01/23/23   [provider]  hydroquinone 4 % cream Apply 1 application topically 2 (two) times daily as needed (dark spots). 01/05/21   [provider]  hydrOXYzine (VISTARIL) 25 MG capsule TAKE 1 CAPSULE(25 MG) BY MOUTH EVERY 8 HOURS AS NEEDED 09/21/22   [name], MD  ipratropium (ATROVENT) 0.03 % nasal spray INSTILL 2 SPRAYS IN EACH NOSTRIL EVERY 12 HOURS Patient taking differently: Place 2 sprays into both nostrils 2 (two) times daily as needed for rhinitis. 04/17/21   [name], MD  loratadine (CLARITIN) 10 MG tablet Take 10 mg by mouth daily as needed for allergies.    [provider]  MAGNESIUM PO Take 330 mg by mouth daily.    [provider]  Omega-3 Fatty Acids (FISH OIL) 1000 MG CAPS Take 1,000 mg by mouth 3 (three) times a week.    [provider]  ondansetron [facility])  4 MG tablet TAKE 1 TABLET(4 MG) BY MOUTH EVERY 8 HOURS AS NEEDED FOR NAUSEA OR VOMITING 03/06/21   [name], PA-C  Plecanatide (TRULANCE) 3 MG TABS Take 3 mg by mouth daily. 05/18/22   [name], [name] Friends, MD  rosuvastatin (CRESTOR) 20 MG tablet Take 1 tablet (20 mg total) by mouth daily. 07/22/23   [name], MD  sodium chloride (OCEAN) 0.65 % SOLN nasal spray Place 1 spray into both nostrils as needed for congestion.    [provider]  triamcinolone cream (KENALOG) 0.1 % Apply 1 Application topically 2 (two) times daily. Patient taking differently: Apply 1 Application topically 2 (two) times daily as needed (irritation). 10/29/21   [name], MD  vitamin E 400 UNIT capsule Take 400 Units by mouth 3 (three) times a week.    [provider]  zinc gluconate 50 MG tablet Take 50 mg by mouth 3 (three) times a week.    [provider]    Social History    Socioeconomic History   Marital status: Single    Spouse name: Not on file   Number of children: 3   Years of education: Not on file   Highest education level: 10th grade  Occupational History   Occupation: Public affairs consultant: [employer]  Tobacco Use   Smoking status: Former    Current packs/day: 0.00    Average packs/day: 0.3 packs/day for 35.0 years (8.8 ttl pk-yrs)    Types: Cigarettes    Start date: 12/31/1977    Quit date: 12/31/2012    Years since quitting: 10.5   Smokeless tobacco: Never  Vaping Use   Vaping status: Never Used  Substance and Sexual Activity   Alcohol use: Not Currently   Drug use: No   Sexual activity: Not Currently    Partners: Male    Birth control/protection: Post-menopausal, Surgical    Comment: tubal  Other Topics Concern   Not on file  Social History Narrative   Eats all food groups.    Wears seatbelt.    Has 3 children.   Prior smoker.   Attends church.    Used to work in Designer, fashion/clothing.    Drives.   Enjoys going out with family.    Right handed   One story home   Social Drivers of Health   Financial Resource Strain: Low Risk  (06/02/2023)   Overall Financial Resource Strain (CARDIA)    Difficulty of Paying Living Expenses: Not very hard  Food Insecurity: No Food Insecurity (07/18/2023)   Hunger Vital Sign    Worried About Running Out of Food in the Last Year: Never true    Ran Out of Food in the Last Year: Never true  Transportation Needs: No Transportation Needs (07/18/2023)   PRAPARE - Administrator, Civil Service (Medical): No    Lack of Transportation (Non-Medical): No  Physical Activity: Sufficiently Active (06/02/2023)   Exercise Vital Sign    Days of Exercise per Week: 2 days    Minutes of Exercise per Session: 90 min  Recent Concern: Physical Activity - Insufficiently Active (03/18/2023)   Exercise Vital Sign    Days of Exercise per Week: 3 days    Minutes of Exercise per Session: 30 min  Stress:  Stress Concern Present (06/02/2023)   [employer] of Occupational Health - Occupational Stress Questionnaire    Feeling of Stress : To some extent  Social Connections: Moderately Integrated (07/12/2023)  Social Advertising account executive [NHANES]    Frequency of Communication with Friends and Family: More than three times a week    Frequency of Social Gatherings with Friends and Family: More than three times a week    Attends Religious Services: More than 4 times per year    Active Member of [employer] or Organizations: Yes    Attends Banker Meetings: 1 to 4 times per year    Marital Status: Divorced  Catering manager Violence: Not At Risk (07/18/2023)   Humiliation, Afraid, Rape, and Kick questionnaire    Fear of Current or Ex-Partner: No    Emotionally Abused: No    Physically Abused: No    Sexually Abused: No   Family History  Problem Relation Age of Onset   Diabetes Other    Diabetes Mother    Heart disease Mother    Heart disease Father    Diabetes Sister    Diabetes Brother    Hypertension Brother    Diabetes Daughter    Hypertension Maternal Grandmother    Stroke Maternal Grandfather    Early death Paternal Grandfather    Colon cancer Neg Hx    Liver disease Neg Hx     ROS: Otherwise negative unless mentioned in HPI  Physical Examination  Vitals:   07/26/23 0649 07/26/23 0655  BP: (!) 141/70   Pulse:    Resp:    Temp:  98.4 F (36.9 C)  SpO2:     Body mass index is 19.23 kg/m.  General:  WDWN in NAD Gait: Not observed HENT: WNL, normocephalic Pulmonary: normal non-labored breathing, without Rales, rhonchi,  wheezing Cardiac: regular Abdomen:  soft, NT/ND, no masses Skin: without rashes Vascular Exam/Pulses: Extremities warm and well-perfused Extremities: without ischemic changes, without Gangrene , without cellulitis; without open wounds;  Musculoskeletal: no muscle wasting or atrophy  Neurologic: A&O X 3;  No focal weakness or  paresthesias are detected; speech is fluent/normal Psychiatric:  The pt has Normal affect. Lymph:  Unremarkable  CBC    Component Value Date/Time   WBC 6.9 07/26/2023 0527   RBC 3.83 (L) 07/26/2023 0527   HGB 11.6 (L) 07/26/2023 0655   HGB 12.8 11/16/2012 0000   HCT 34.0 (L) 07/26/2023 0655   HCT 38 11/16/2012 0000   PLT 399 07/26/2023 0527   MCV 91.6 07/26/2023 0527   MCV 86.2 11/16/2012 0000   MCH 30.0 07/26/2023 0527   MCHC 32.8 07/26/2023 0527   RDW 12.4 07/26/2023 0527   LYMPHSABS 2.6 07/26/2023 0527   MONOABS 0.4 07/26/2023 0527   EOSABS 0.3 07/26/2023 0527   BASOSABS 0.0 07/26/2023 0527    BMET    Component Value Date/Time   NA 140 07/26/2023 0655   NA 141 11/16/2012 0000   K 4.0 07/26/2023 0655   K 4.1 11/16/2012 0000   CL 107 07/26/2023 0655   CO2 22 07/26/2023 0559   GLUCOSE 110 (H) 07/26/2023 0655   BUN 5 (L) 07/26/2023 0655   BUN 11 11/16/2012 0000   CREATININE 0.80 07/26/2023 0655   CREATININE 0.87 07/22/2023 1030   CALCIUM 8.6 (L) 07/26/2023 0559   CALCIUM 9.7 11/16/2012 0000   GFRNONAA >60 07/26/2023 0559   GFRNONAA 64 06/10/2020 1541   GFRAA 74 06/10/2020 1541    COAGS: Lab Results  Component Value Date   INR 1.2 07/26/2023   INR 1.01 09/15/2014      ASSESSMENT/PLAN: This is a 68 y.o. female with history of bleed on triple  therapy.  Patient unable to tolerate anticoagulation at this time.  She would benefit from inferior vena cava filter to prevent pulmonary embolism from known DVT.  After discussing the risks and benefits, Nkenge elected to proceed. I called her daughter and discussed the above.  She was also okay proceeding.   Victorino Sparrow MD MS [facility] [phone] 07/26/2023  9:15 AM

## 2023-07-26 NOTE — Anesthesia Postprocedure Evaluation (Signed)
 Anesthesia Post Note  Patient: [name]  Procedure(s) Performed: INSERTION, UMBRELLA FILTER, INFERIOR VENA CAVA FEMORAL ARTERY     Patient location during evaluation: PACU Anesthesia Type: General Level of consciousness: awake and alert Pain management: pain level controlled Vital Signs Assessment: post-procedure vital signs reviewed and stable Respiratory status: spontaneous breathing, nonlabored ventilation and respiratory function stable Cardiovascular status: stable and blood pressure returned to baseline Postop Assessment: no apparent nausea or vomiting Anesthetic complications: no  No notable events documented.  Last Vitals:  Vitals:   07/26/23 1630 07/26/23 1645  BP: 120/60 (!) 136/56  Pulse: 67 64  Resp: 12 11  Temp:    SpO2: 96% 98%    Last Pain:  Vitals:   07/26/23 1530  TempSrc:   PainSc: 0-No pain                 Damonique Brunelle,W. EDMOND

## 2023-07-26 NOTE — Progress Notes (Addendum)
 STROKE TEAM PROGRESS NOTE   INTERIM HISTORY/SUBJECTIVE Family at the bedside.  Patient is laying in bed in no apparent distress  Patient states that she still has not nagging headache for-5 out of 10.  She is on Cleviprex at 2 mg She is scheduled for IVC filter today  OBJECTIVE  CBC    Component Value Date/Time   WBC 6.9 07/26/2023 0527   RBC 3.83 (L) 07/26/2023 0527   HGB 11.6 (L) 07/26/2023 0655   HGB 12.8 11/16/2012 0000   HCT 34.0 (L) 07/26/2023 0655   HCT 38 11/16/2012 0000   PLT 399 07/26/2023 0527   MCV 91.6 07/26/2023 0527   MCV 86.2 11/16/2012 0000   MCH 30.0 07/26/2023 0527   MCHC 32.8 07/26/2023 0527   RDW 12.4 07/26/2023 0527   LYMPHSABS 2.6 07/26/2023 0527   MONOABS 0.4 07/26/2023 0527   EOSABS 0.3 07/26/2023 0527   BASOSABS 0.0 07/26/2023 0527    BMET    Component Value Date/Time   NA 140 07/26/2023 0655   NA 141 11/16/2012 0000   K 4.0 07/26/2023 0655   K 4.1 11/16/2012 0000   CL 107 07/26/2023 0655   CO2 22 07/26/2023 0559   GLUCOSE 110 (H) 07/26/2023 0655   BUN 5 (L) 07/26/2023 0655   BUN 11 11/16/2012 0000   CREATININE 0.80 07/26/2023 0655   CREATININE 0.87 07/22/2023 1030   CALCIUM 8.6 (L) 07/26/2023 0559   CALCIUM 9.7 11/16/2012 0000   EGFR 72 07/22/2023 1030   GFRNONAA >60 07/26/2023 0559   GFRNONAA 64 06/10/2020 1541    IMAGING past 24 hours VAS [place] LOWER EXTREMITY VENOUS (DVT) (ONLY [facility] & [facility]) Result Date: 07/26/2023  Lower Venous DVT Study Patient Name:  [name] [place]  Date of Exam:   07/26/2023 Medical Rec #: [id number]           Accession #:    [id number] Date of Birth: November 06, 1955           Patient Gender: F Patient Age:   68 years Exam Location:  [facility] Procedure:      VAS [place] LOWER EXTREMITY VENOUS (DVT) Referring Phys: Dione Booze --------------------------------------------------------------------------------  Indications: DVT.  Risk Factors: DVT LT CFV/SFJ 07/19/23 Surgery LT Cath past pregnancy. Anticoagulation: Eliquis.  Comparison Study: Improved since previous exam 07/19/23 Performing Technologist: Shona Simpson  Examination Guidelines: A complete evaluation includes B-mode imaging, spectral Doppler, color Doppler, and power Doppler as needed of all accessible portions of each vessel. Bilateral testing is considered an integral part of a complete examination. Limited examinations for reoccurring indications may be performed as noted. The reflux portion of the exam is performed with the patient in reverse Trendelenburg.  +-----+---------------+---------+-----------+----------+--------------+ RIGHTCompressibilityPhasicitySpontaneityPropertiesThrombus Aging +-----+---------------+---------+-----------+----------+--------------+ CFV  Full           Yes      Yes                                 +-----+---------------+---------+-----------+----------+--------------+   +--------+---------------+---------+-----------+----------------+-------------+ LEFT    CompressibilityPhasicitySpontaneityProperties      Thrombus                                                                 Aging         +--------+---------------+---------+-----------+----------------+-------------+  CFV     Partial        Yes      Yes        rigid           Acute                                                    w/compression                 +--------+---------------+---------+-----------+----------------+-------------+ SFJ     Partial                 Yes        rigid           Acute                                                    w/compression                 +--------+---------------+---------+-----------+----------------+-------------+ FV Prox Full                                                             +--------+---------------+---------+-----------+----------------+-------------+ FV Mid  Full                                                              +--------+---------------+---------+-----------+----------------+-------------+ FV      Full                                                             Distal                                                                   +--------+---------------+---------+-----------+----------------+-------------+ PFV     Full                                                             +--------+---------------+---------+-----------+----------------+-------------+ POP     Full           Yes      Yes                                      +--------+---------------+---------+-----------+----------------+-------------+  PTV     Full                                                             +--------+---------------+---------+-----------+----------------+-------------+ PERO    Full                                                             +--------+---------------+---------+-----------+----------------+-------------+    Summary: RIGHT: - No evidence of common femoral vein obstruction.   LEFT: - Findings consistent with acute deep vein thrombosis involving the left common femoral vein, and SF junction.  - Findings appear improved from previous examination. - Thrombus is significantly smaller than previously seen.  *See table(s) above for measurements and observations.    Preliminary    CT HEAD POST STROKE FOLLOWUP/TIMED/STAT READ Result Date: 07/26/2023 CLINICAL DATA:  Code stroke. EXAM: CT HEAD WITHOUT CONTRAST TECHNIQUE: Contiguous axial images were obtained from the base of the skull through the vertex without intravenous contrast. RADIATION DOSE REDUCTION: This exam was performed according to the departmental dose-optimization program which includes automated exposure control, adjustment of the mA and/or kV according to patient size and/or use of iterative reconstruction technique. COMPARISON:  Head CT from earlier the same day FINDINGS: Brain: Subacute right PCA territory infarct  with generalized superficial based hemorrhage is non progressed, hemorrhagic area measuring up to 4.4 cm anterior to posterior. No new site of infarct or hemorrhage. Chronic small vessel infarct at the right caudate head. No hydrocephalus or shift. Vascular: No hyperdense vessel or unexpected calcification. Skull: Normal. Negative for fracture or focal lesion. Sinuses/Orbits: No acute finding. Other: Prelim sent in epic chat. IMPRESSION: No interval progression of recent infarct with hemorrhage in the right occipital lobe. Electronically Signed   By: Tiburcio Pea M.D.   On: 07/26/2023 09:00   CT HEAD WO CONTRAST ( ) Addendum Date: 07/26/2023 ADDENDUM REPORT: 07/26/2023 06:21 ADDENDUM: Study discussed by telephone with Dr. Dione Booze in the ED on 07/26/2023 at 0608 hours. Electronically Signed   By: Odessa Fleming M.D.   On: 07/26/2023 06:21   Result Date: 07/26/2023 CLINICAL DATA:  68 year old female with new onset headache at 2300 hours, epistaxis. On Eliquis and Plavix. Recent peripheral vascular stent placement. Recent right PCA territory infarct this month. EXAM: CT HEAD WITHOUT CONTRAST TECHNIQUE: Contiguous axial images were obtained from the base of the skull through the vertex without intravenous contrast. RADIATION DOSE REDUCTION: This exam was performed according to the departmental dose-optimization program which includes automated exposure control, adjustment of the mA and/or kV according to patient size and/or use of iterative reconstruction technique. COMPARISON:  Brain MRI 07/12/2023.  Head CT 07/12/2023. FINDINGS: Brain: Hemorrhagic transformation of the right PCA infarct diagnosed on 07/12/2023 (series 3, image 16). Hemorrhage is confluent but petechial type, with only mild regional mass effect and no significant intracranial mass effect or midline shift overall. No intraventricular or extra-axial extension is identified. Stable underlying cerebral volume and extent of the right PCA infarct is  stable. Stable gray-white differentiation elsewhere. No ventriculomegaly. Normal basilar cisterns. Vascular: Calcified atherosclerosis at the skull base. No suspicious intracranial vascular  hyperdensity. Skull: Stable, intact. Sinuses/Orbits: Increased but generally mild paranasal sinus mucosal thickening. No sinus fluid levels. Tympanic cavities and mastoids remain well aerated. Other: Visualized orbits and scalp soft tissues are stable, within normal limits. IMPRESSION: 1. Positive for hemorrhagic transformation of the Right PCA infarct since 07/12/2023, but confluent petechial type rather than malignant intracranial hemorrhage. No significant intracranial mass effect or other complicating features at this time. 2. Stable extent of the underlying PCA infarct. No other acute intracranial abnormality. Electronically Signed: By: Odessa Fleming M.D. On: 07/26/2023 05:54    Vitals:   07/26/23 0900 07/26/23 1000 07/26/23 1100 07/26/23 1200  BP: (!) 145/53 (!) 133/52 (!) 147/59 (!) 154/66  Pulse: 71 73 74 77  Resp: 13 15 13 13   Temp:    98.4 F (36.9 C)  TempSrc:    Oral  SpO2: 99% 97% 99% 98%  Weight:      Height:         PHYSICAL EXAM General:  Alert, well-nourished, well-developed patient in no acute distress Psych:  Mood and affect appropriate for situation CV: Regular rate and rhythm on monitor Respiratory:  Regular, unlabored respirations on room air GI: Abdomen soft and nontender   NEURO:  Mental Status: AA&Ox3, patient is able to give clear and coherent history Speech/Language: speech is without dysarthria or aphasia.  Naming, repetition, fluency, and comprehension intact.  Cranial Nerves:  II: PERRL.  Left upper quadranopia  III, IV, VI: EOMI. Eyelids elevate symmetrically.  V: Sensation is intact to light touch and symmetrical to face.  VII: Face is symmetrical resting and smiling VIII: hearing intact to voice. IX, X: Palate elevates symmetrically. Phonation is normal.  KV:[id number]  shrug 5/5. XII: tongue is midline without fasciculations. Motor: 5/5 strength to all muscle groups tested.  Tone: is normal and bulk is normal Sensation- Intact to light touch bilaterally. Extinction absent to light touch to DSS.   Coordination: FTN intact bilaterally, HKS: no ataxia in BLE.No drift.  Gait- deferred  Most Recent NIH  1a Level of Conscious.: 0 1b LOC Questions: 0 1c LOC Commands: 0 2 Best Gaze: 0 3 Visual: 1 4 Facial Palsy: 0 5a Motor Arm - left: 0 5b Motor Arm - Right: 0 6a Motor Leg - Left: 0 6b Motor Leg - Right: 0 7 Limb Ataxia: 0 8 Sensory: 0 9 Best Language: 0 10 Dysarthria: 0 11 Extinct. and Inatten.: 0 TOTAL: 1   ASSESSMENT/PLAN  Ms. MANUEL LAWHEAD is a 68 y.o. female with history of CAD, stenting of bilateral common iliac arteries on 07/11/2023, hypertension, hyperlipidemia, anxiety, headaches, was recently found to have a CFA DVT and was started on Eliquis in addition to aspirin and Plavix admitted for acute onset of headache and nosebleeds.  ICH score 0 NIH on Admission 1  Hemorrhagic transformation of recent PCA infarct Etiology: Likely due to triple therapy CT head Positive for hemorrhagic transformation of the Right PCA infarct since 07/12/2023, but confluent petechial type rather than malignant intracranial hemorrhage CT head repeat No interval progression of recent infarct with hemorrhage in the right occipital lobe. VTE prophylaxis -SCDs aspirin 81 mg daily, clopidogrel 75 mg daily, and Eliquis (apixaban) daily prior to admission, now on No antithrombotic due to hemorrhagic transformation. Plan to restart plavix in 2 days if stable given recent LE stents Therapy recommendations:  Pending Disposition: Pending  Hx of Stroke/TIA 07/11/2023 occipital lobe infarct post LE stenting procedure. Considered to be related to procedure. EF 55-60%, LDL 75 and A1C  6.0. CTA showed left ICA 60% stenosis. Recommend outpt follow up with [facility]. Discharged on DAPT  and crestor 20  DVT LE doppler 3/11 - Hyperacute DVT involving the CFV and proximal femoral  vein. Supercial vein thrombosis involving the great saphenous vein at the  SFJ.  Put on eliquis with loading dose 10mg  bid for 7 days and DAPT continued. LE ultrasound this admission -left  acute deep vein thrombosis involving the left common femoral vein, and SF junction  Now off eliquis due to epistaxis and hemorrhagic conversion IVC filter pending  Hypertensive Emergency  Home meds: Coreg Stable now On Cleviprex at 2 mg, wean as tolerated Resume home coreg As needed labetalol and hydralazine to maintain BP goal BP goal < 160  Hyperlipidemia Home meds: Crestor 20 mg, resumed in hospital LDL 75, goal < 70 Continue statin at discharge  Other Stroke Risk Factors Coronary artery disease PVD s/p stenting  Other Active Problems Anemia-Hgb-11.5 will monitor  Hospital day # 0   Gevena Mart DNP, ACNPC-AG  [facility]  ATTENDING NOTE: I reviewed above note and agree with the assessment and plan. Pt was seen and examined.   Families at the bedside. Pt lying in bed, AAO x 3, still has mild HA and nose bleed has stopped. Exam no significant change from last discharge, still has left upper quadrantanopia, otherwise neuro intact. Pending IVC filter today. Now off eliquis and DAPT due to HT. Will consider resume plavix in 2 days if stable. BP goal < 160. Resume coreg and statin.   For detailed assessment and plan, please refer to above/below as I have made changes wherever appropriate.   Marvel Plan, MD PhD Stroke Neurology 07/26/2023 3:56 PM  This patient is critically ill due to HT from previous stroke, nose bleed and at significant risk of neurological worsening, death form recurrent stroke, PE. This patient's care requires constant monitoring of vital signs, hemodynamics, respiratory and cardiac monitoring, review of multiple databases, neurological assessment, discussion with  family, other specialists and medical decision making of high complexity. I spent 35 minutes of neurocritical care time in the care of this patient.    To contact Stroke Continuity provider, please refer to [email or web address]. After hours, contact General Neurology

## 2023-07-26 NOTE — Interval H&P Note (Signed)
 History and Physical Interval Note:  07/26/2023 2:26 PM  [name]  has presented today for surgery, with the diagnosis of hemorrhage right cerebral hemisphere.  The various methods of treatment have been discussed with the patient and family. After consideration of risks, benefits and other options for treatment, the patient has consented to  Procedure(s): INSERTION, UMBRELLA FILTER, INFERIOR VENA CAVA (N/A) as a surgical intervention.  The patient's history has been reviewed, patient examined, no change in status, stable for surgery.  I have reviewed the patient's chart and labs.  Questions were answered to the patient's satisfaction.     Lemar Livings

## 2023-07-26 NOTE — ED Provider Triage Note (Signed)
 Emergency Medicine Provider Triage Evaluation Note  [name] , a 68 y.o. female  was evaluated in triage.  Pt complains of headache that started yesterday around noon.  Had nose bleed that started this evening.  Denies new numbness or weakness.  Has existing visual disturbance from prior stroke.  Review of Systems  Positive: Headache Negative: fever  Physical Exam  BP (!) 167/100   Pulse 92   Temp 98.1 F (36.7 C) (Oral)   Resp 16   Ht 5\' 7"  (1.702 m)   Wt 73.5 kg   SpO2 99%   BMI 25.37 kg/m  Gen:   Awake, no distress   Resp:  Normal effort  MSK:   Moves extremities without difficulty  Other:    Medical Decision Making  Medically screening exam initiated at 5:43 AM.  Appropriate orders placed.  [name] was informed that the remainder of the evaluation will be completed by another provider, this initial triage assessment does not replace that evaluation, and the importance of remaining in the ED until their evaluation is complete.  ICH seen on CT.  Patient moved to a room for treatment and further care.Roxy Horseman, PA-C 07/26/23 [phone]

## 2023-07-27 ENCOUNTER — Other Ambulatory Visit: Payer: Self-pay

## 2023-07-27 ENCOUNTER — Encounter (HOSPITAL_COMMUNITY): Payer: Self-pay | Admitting: Vascular Surgery

## 2023-07-27 DIAGNOSIS — I629 Nontraumatic intracranial hemorrhage, unspecified: Secondary | ICD-10-CM | POA: Diagnosis not present

## 2023-07-27 LAB — TRIGLYCERIDES: Triglycerides: 55 mg/dL (ref <150)

## 2023-07-27 LAB — MAGNESIUM: Magnesium: 2.2 mg/dL (ref 1.7–2.4)

## 2023-07-27 MED ORDER — BUTALBITAL-APAP-CAFFEINE 50-325-40 MG PO TABS: 1.0000 | ORAL_TABLET | Freq: Three times a day (TID) | ORAL | Status: DC | PRN

## 2023-07-27 NOTE — Evaluation (Addendum)
 Physical Therapy Evaluation Patient Details Name: [name] MRN: [id number] DOB: 1955-12-09 Today's Date: 07/27/2023  History of Present Illness  Patient is a 68 y/o female admitted 07/26/23 with sudden headache and nose bleeds.  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  Recent admission for stenting of bilateral common iliac arteries on 07/11/23 and on dual antiplatelets until CFA DVT found and started on Eliquis also.  She underwent IVC filter placement with R groin access on 07/26/23.  PMH positive for hyperlipidemia, HH, HTN, pre-diabetes, carotid stenosis, osteoporosis, Schatzki's ring and seasonal allergies.  Clinical Impression  Patient presents with decreased mobility due to decreased balance, decreased L side vision for obstacle negotiation and limited activity tolerance.  She was previously living at home with her brother and grandaughter completing all ADL/IADL's.  She was able to mobilize with S today for bed mobility and CGA to S for ambulation in the hallway.  She will benefit from skilled PT In the acute setting though likely will not need follow up PT at d/c.         If plan is discharge home, recommend the following: Assist for transportation;Help with stairs or ramp for entrance   Can travel by private vehicle        Equipment Recommendations None recommended by PT  Recommendations for Other Services       Functional Status Assessment Patient has had a recent decline in their functional status and demonstrates the ability to make significant improvements in function in a reasonable and predictable amount of time.     Precautions / Restrictions Precautions Precautions: Fall Precaution/Restrictions Comments: SBP <160      Mobility  Bed Mobility Overal bed mobility: Modified Independent                  Transfers Overall transfer level: Needs assistance   Transfers: Sit to/from Stand Sit to Stand: Supervision           General  transfer comment: for safety and lines    Ambulation/Gait Ambulation/Gait assistance: Contact guard assist Gait Distance (Feet): 400 Feet Assistive device: None Gait Pattern/deviations: Step-through pattern, Decreased stride length, Drifts right/left       General Gait Details: mild veering to L at times; cues for reading room number on L side and noting items in picture on R side without LOB though stopped whne looking at picture  Stairs            Wheelchair Mobility     Tilt Bed    Modified Rankin (Stroke Patients Only) Modified Rankin (Stroke Patients Only) Pre-Morbid Rankin Score: No symptoms Modified Rankin: Moderate disability     Balance Overall balance assessment: Needs assistance   Sitting balance-Leahy Scale: Good       Standing balance-Leahy Scale: Good Standing balance comment: no UE support, mild dynamic deficits on L side with ambulation                             Pertinent Vitals/Pain Pain Assessment Pain Assessment: 0-10 Pain Score: 2  Pain Location: headache Pain Descriptors / Indicators: Headache Pain Intervention(s): Monitored during session    Home Living Family/patient expects to be discharged to:: Private residence Living Arrangements: Other relatives (brother and Information systems manager) Available Help at Discharge: Family Type of Home: House Home Access: Stairs to enter Entrance Stairs-Rails: Doctor, general practice of Steps: 4   Home Layout: One level Home Equipment: Grab  bars - tub/shower      Prior Function Prior Level of Function : Independent/Modified Independent             Mobility Comments: retired, cooks, Lexicographer, and drives       Extremity/Trunk Assessment   Upper Extremity Assessment Upper Extremity Assessment: Overall WFL for tasks assessed    Lower Extremity Assessment Lower Extremity Assessment: Overall WFL for tasks assessed (mild numbness on R great toe)       Communication    Communication Communication: No apparent difficulties    Cognition Arousal: Alert Behavior During Therapy: WFL for tasks assessed/performed   PT - Cognitive impairments: No apparent impairments                         Following commands: Intact       Cueing       General Comments General comments (skin integrity, edema, etc.): reports wears glasses for reading, she has some "spots" she sees on L side; SBP maintained 130-150 during session    Exercises     Assessment/Plan    PT Assessment Patient needs continued PT services  PT Problem List Decreased mobility;Decreased balance;Impaired sensation       PT Treatment Interventions DME instruction;Balance training;Stair training;Gait training;Functional mobility training;Therapeutic activities;Patient/family education    PT Goals (Current goals can be found in the Care Plan section)  Acute Rehab PT Goals Patient Stated Goal: return to independent PT Goal Formulation: With patient Time For Goal Achievement: 08/03/23 Potential to Achieve Goals: Good    Frequency Min 3X/week     Co-evaluation               AM-PAC PT "6 Clicks" Mobility  Outcome Measure Help needed turning from your back to your side while in a flat bed without using bedrails?: None Help needed moving from lying on your back to sitting on the side of a flat bed without using bedrails?: None Help needed moving to and from a bed to a chair (including a wheelchair)?: A Little Help needed standing up from a chair using your arms (e.g., wheelchair or bedside chair)?: A Little Help needed to walk in hospital room?: A Little Help needed climbing 3-5 steps with a railing? : A Little 6 Click Score: 20    End of Session Equipment Utilized During Treatment: Gait belt Activity Tolerance: Patient tolerated treatment well Patient left: in chair;with call bell/phone within reach   PT Visit Diagnosis: Other symptoms and signs involving the nervous  system (R29.898)    Time: 7106-2694 PT Time Calculation (min) (ACUTE ONLY): 26 min   Charges:   PT Evaluation $PT Eval Low Complexity: 1 Low PT Treatments $Gait Training: 8-22 mins PT General Charges $$ ACUTE PT VISIT: 1 Visit         [name], PT Acute Rehabilitation Services [phone] 07/27/2023   Elray Mcgregor 07/27/2023, 11:07 AM

## 2023-07-27 NOTE — Progress Notes (Addendum)
 STROKE TEAM PROGRESS NOTE   INTERIM HISTORY/SUBJECTIVE . IVC filter placed 3/18.   No family at bedside.  Patient is sitting up in chair, no acute distress.  Neurological exam is stable with no acute events overnight Patient does report intermittent 4/10 headache that she states goes away on its own.   Will transfer out of ICU.   OBJECTIVE CBC    Component Value Date/Time   WBC 6.9 07/26/2023 0527   RBC 3.83 (L) 07/26/2023 0527   HGB 11.6 (L) 07/26/2023 0655   HGB 12.8 11/16/2012 0000   HCT 34.0 (L) 07/26/2023 0655   HCT 38 11/16/2012 0000   PLT 399 07/26/2023 0527   MCV 91.6 07/26/2023 0527   MCV 86.2 11/16/2012 0000   MCH 30.0 07/26/2023 0527   MCHC 32.8 07/26/2023 0527   RDW 12.4 07/26/2023 0527   LYMPHSABS 2.6 07/26/2023 0527   MONOABS 0.4 07/26/2023 0527   EOSABS 0.3 07/26/2023 0527   BASOSABS 0.0 07/26/2023 0527   BMET    Component Value Date/Time   NA 140 07/26/2023 0655   NA 141 11/16/2012 0000   K 4.0 07/26/2023 0655   K 4.1 11/16/2012 0000   CL 107 07/26/2023 0655   CO2 22 07/26/2023 0559   GLUCOSE 110 (H) 07/26/2023 0655   BUN 5 (L) 07/26/2023 0655   BUN 11 11/16/2012 0000   CREATININE 0.80 07/26/2023 0655   CREATININE 0.87 07/22/2023 1030   CALCIUM 8.6 (L) 07/26/2023 0559   CALCIUM 9.7 11/16/2012 0000   EGFR 72 07/22/2023 1030   GFRNONAA >60 07/26/2023 0559   GFRNONAA 64 06/10/2020 1541   IMAGING past 24 hours   Vitals:   07/27/23 0900 07/27/23 1000 07/27/23 1100 07/27/23 1200  BP: 130/66 (!) 142/64 (!) 131/53 (!) 125/57  Pulse: 67 71 69 69  Resp: 14 (!) 9 14 (!) 21  Temp:      TempSrc:      SpO2: 99% 97% 98% 98%  Weight:      Height:       PHYSICAL EXAM General:  Alert, well-nourished, well-developed patient in no acute distress Psych: Calm and cooperative CV: Regular rate and rhythm on monitor Respiratory: Unlabored respirations, room air.  NEURO:  Mental Status: Awake alert and oriented.  She is able to give clear and  comprehensive history Speech/Language: No dysarthria or aphasia present.  Cranial Nerves:  II: PERRL.  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 Eyelids elevate symmetrically. Control and instrumentation engineer.  V: Facial sensation intact and symmetrical VII: Face is symmetrical resting and smiling VIII: hearing intact to voice. IX, X: Palate elevates symmetrically. Phonation is normal.  OJ:[id number] shrug 5/5. XII: Midline tongue protrusion Motor: 5/5 strength to all muscle groups tested.  No drift present.  No tremors present. Tone: is normal and bulk is normal Sensation- Intact to light touch bilaterally. Extinction absent to light touch to DSS.   Coordination: FTN/HKS intact bilaterally.  No ataxia Gait- deferred  Most Recent NIH  1a Level of Conscious.: 0 1b LOC Questions: 0 1c LOC Commands: 0 2 Best Gaze: 0 3 Visual: 1 4 Facial Palsy: 0 5a Motor Arm - left: 0 5b Motor Arm - Right: 0 6a Motor Leg - Left: 0 6b Motor Leg - Right: 0 7 Limb Ataxia: 0 8 Sensory: 0 9 Best Language: 0 10 Dysarthria: 0 11 Extinct. and Inatten.: 0 TOTAL: 1   ASSESSMENT/PLAN  [name] is a 68 y.o. female with history of CAD,  stenting of bilateral common iliac arteries on 07/11/2023, hypertension, hyperlipidemia, anxiety, headaches, was recently found to have a CFA DVT and was started on Eliquis in addition to aspirin and Plavix admitted for acute onset of headache and nosebleeds.  ICH score 0 NIH on Admission 1  Hemorrhagic transformation of recent PCA infarct Etiology: Likely due to triple therapy CT head Positive for hemorrhagic transformation of the Right PCA infarct since 07/12/2023, but confluent petechial type rather than malignant intracranial hemorrhage CT head repeat No interval progression of recent infarct with hemorrhage in the right occipital lobe. Repeat CTH: Ordered for tomorrow AM  VTE prophylaxis -SCDs aspirin 81 mg daily, clopidogrel 75 mg daily, and Eliquis  (apixaban) daily prior to admission, now on No antithrombotic due to hemorrhagic transformation. Plan to restart plavix tomorrow if stable after CT given recent LE stents  Plavix OK from Vascular Surgery standpoint Therapy recommendations:  No follow-up Disposition: likely home tomorrow, pending repeat CT  Hx of Stroke/TIA 07/11/2023 occipital lobe infarct post LE stenting procedure. Considered to be related to procedure. EF 55-60%, LDL 75 and A1C 6.0. CTA showed left ICA 60% stenosis. Recommend outpt follow up with VVS. Discharged on DAPT and crestor 20  DVT LE doppler 3/11 - Hyperacute DVT involving the CFV and proximal femoral  vein. Supercial vein thrombosis involving the great saphenous vein at the  SFJ.  Was given eliquis with loading dose 10mg  bid for 7 days and DAPT continued. LE ultrasound this admission - left  acute deep vein thrombosis involving the left common femoral vein, and SF junction  Now off eliquis due to epistaxis and hemorrhagic conversion IVC filter competed 3/18 by Vascular Surgery  Headache Reports intermittent mild headache States "it goes away on its own after a few minutes" Tylenol PRN Fioricet only if headache continues after tylenol administration  Hypertensive Emergency  Home meds: Coreg, resumed  Stable now Off Cleviprex gtt PRN labetalol and hydralazine to maintain BP goal Continue BP goal < 160  Hyperlipidemia Home meds: Crestor 20 mg, resumed in hospital LDL 75, goal < 70 Continue statin at discharge  Other Stroke Risk Factors Coronary artery disease PVD s/p stenting  Other Active Problems Mild anemia-Hgb-11.5--11.6   Hospital day # 1   Pt seen by Neuro NP/APP and later by MD. Note/plan to be edited by MD as needed.    Kimberly January, DNP, AGACNP-BC [facility] Please use AMION for contact information & EPIC for messaging.  ATTENDING NOTE: I reviewed above note and agree with the assessment and plan. Pt was seen and  examined.   Family at the bedside. Pt sitting in chair, eating lunch, no acute event overnight. Neuro stable. Had IVC filter yesterday, will check CT in am and if stable will put on plavix on discharge. Continue statin. PT and OT no recs.   For detailed assessment and plan, please refer to above/below as I have made changes wherever appropriate.   Marvel Plan, MD PhD Stroke Neurology 07/27/2023 4:06 PM  This patient is critically ill due to HT from previous stroke, nose bleed and at significant risk of neurological worsening, death form recurrent stroke, PE. This patient's care requires constant monitoring of vital signs, hemodynamics, respiratory and cardiac monitoring, review of multiple databases, neurological assessment, discussion with family, other specialists and medical decision making of high complexity. I spent 35 minutes of neurocritical care time in the care of this patient.    To contact Stroke Continuity provider, please refer to [email or web address]. After hours,  contact General Neurology

## 2023-07-27 NOTE — Progress Notes (Signed)
 SLP Cancellation Note  Patient Details Name: [name] MRN: [id number] DOB: 08/13/1955   Cancelled treatment:       Reason Eval/Treat Not Completed: SLP screened, no needs identified, will sign off. Pt is Ox4 and conversational speech is fluent and intelligible with no anomia or concerns for dysarthria. Pt denied acute changes to cognition, speech, or language functions since recent CVA. A formal speech/language assessment is not indicated at this time. SLP will sign off.    Ellery Plunk 07/27/2023, 9:28 AM

## 2023-07-27 NOTE — Progress Notes (Signed)
  Progress Note    07/27/2023 8:40 AM 1 Day Post-Op  Subjective: Still having mild headaches  Vitals:   07/27/23 0700 07/27/23 0800  BP: (!) 117/54 139/63  Pulse: (!) 57 65  Resp: 15 16  Temp:  98.2 F (36.8 C)  SpO2: 97% 99%    Physical Exam: Awake alert and oriented On the respirations Right groin soft without hematoma and palpable right common femoral pulse Right foot is warm and well-perfused  CBC    Component Value Date/Time   WBC 6.9 07/26/2023 0527   RBC 3.83 (L) 07/26/2023 0527   HGB 11.6 (L) 07/26/2023 0655   HGB 12.8 11/16/2012 0000   HCT 34.0 (L) 07/26/2023 0655   HCT 38 11/16/2012 0000   PLT 399 07/26/2023 0527   MCV 91.6 07/26/2023 0527   MCV 86.2 11/16/2012 0000   MCH 30.0 07/26/2023 0527   MCHC 32.8 07/26/2023 0527   RDW 12.4 07/26/2023 0527   LYMPHSABS 2.6 07/26/2023 0527   MONOABS 0.4 07/26/2023 0527   EOSABS 0.3 07/26/2023 0527   BASOSABS 0.0 07/26/2023 0527    BMET    Component Value Date/Time   NA 140 07/26/2023 0655   NA 141 11/16/2012 0000   K 4.0 07/26/2023 0655   K 4.1 11/16/2012 0000   CL 107 07/26/2023 0655   CO2 22 07/26/2023 0559   GLUCOSE 110 (H) 07/26/2023 0655   BUN 5 (L) 07/26/2023 0655   BUN 11 11/16/2012 0000   CREATININE 0.80 07/26/2023 0655   CREATININE 0.87 07/22/2023 1030   CALCIUM 8.6 (L) 07/26/2023 0559   CALCIUM 9.7 11/16/2012 0000   GFRNONAA >60 07/26/2023 0559   GFRNONAA 64 06/10/2020 1541   GFRAA 74 06/10/2020 1541    INR    Component Value Date/Time   INR 1.2 07/26/2023 0527     Intake/Output Summary (Last 24 hours) at 07/27/2023 0840 Last data filed at 07/27/2023 0800 Gross per 24 hour  Intake 579.01 ml  Output 1702 ml  Net -1122.99 ml     Assessment/plan:  68 y.o. female has history of bilateral common iliac artery stenting for rest pain of the right lower extremity complicated by intraprocedural CVA.  She subsequently was found to have DVT and started on Eliquis and now is here with small  hemorrhagic stroke in right occipital lobe.  IVC filter was placed yesterday due to contraindication of Eliquis.  Plan will be to restart Plavix when okay per neurology and should be okay from a vascular standpoint to be on single agent therapy due to the location of the recent stents.  Grantley Savage C. Randie Heinz, MD [facility] Office: [phone] Pager: [phone]  07/27/2023 8:40 AM

## 2023-07-27 NOTE — TOC Initial Note (Signed)
 Transition of Care [facility]) - Initial/Assessment Note    Patient Details  Name: [name] MRN: [id number] Date of Birth: 1956/03/11  Transition of Care [facility]) CM/SW Contact:    Lamonte Sakai, Student-Social Work Phone Number: 07/27/2023, 9:13 AM  Clinical Narrative:                  Pt admitted from home undergoing stroke work up. Please consult TOC as needs arise following therapy evals.       Patient Goals and CMS Choice            Expected Discharge Plan and Services       Living arrangements for the past 2 months: Single Family Home                                      Prior Living Arrangements/Services Living arrangements for the past 2 months: Single Family Home                     Activities of Daily Living      Permission Sought/Granted                  Emotional Assessment       Orientation: : Oriented to Self, Oriented to Place, Oriented to  Time, Oriented to Situation      Admission diagnosis:  Epistaxis [R04.0] Non-traumatic intracranial hemorrhage (HCC) [I62.9] Anticoagulated [Z79.01] Normochromic normocytic anemia [D64.9] Poorly-controlled hypertension [I10] Nontraumatic hemorrhage of right cerebral hemisphere [facility]) [I61.2] Patient Active Problem List   Diagnosis Date Noted   Non-traumatic intracranial hemorrhage (HCC) 07/26/2023   Cerebrovascular accident (CVA) (HCC) 07/19/2023   PAD (peripheral artery disease) (HCC) 07/11/2023   Left-sided chest wall pain 05/02/2023   Migraine aura, persistent, intractable, with status migrainosus 04/13/2023   Viral URI with cough 11/24/2022   Acute bacterial rhinosinusitis 11/24/2022   Chronic nonintractable headache 10/12/2021   Non-seasonal allergic rhinitis 05/19/2021   Pulsatile tinnitus of left ear 05/19/2021   Excessive cerumen in ear canal, right 05/19/2021   Hyperlipidemia    Hypertension    Abdominal aortic aneurysm (AAA) 3.0 cm to 5.0 cm in diameter in female  [facility])    Nausea without vomiting 12/06/2018   Loss of weight 12/06/2018   Controlled diabetes mellitus type 2 with complications (HCC) 07/26/2017   Essential hypertension 04/25/2017   Hyperlipidemia LDL goal <70 04/25/2017   Carotid artery stenosis without cerebral infarction, bilateral 04/25/2017   AAA (abdominal aortic aneurysm) without rupture (HCC) 04/25/2017   History of colonic polyps    Diverticulosis of colon without hemorrhage    RUQ pain 02/21/2013   Abdominal pain, epigastric 01/05/2013   Abdominal bruit 01/05/2013   Esophageal dysphagia 01/05/2013   Constipation 01/05/2013   GERD (gastroesophageal reflux disease) 01/05/2013   PCP:  [name], MD Pharmacy:   [facility] [phone] - [place], [place] - [address] [place] [address] Phone: [phone] Fax: [phone]  [id number] - [place], [place] - [address] AT [facility] [address] [place] [address] Phone: [phone] Fax: [phone]  [place] [facility] [address] [place] [address] Phone: [phone] Fax: [phone]     Social Drivers of Health (SDOH) Social History: SDOH Screenings   Food Insecurity: No Food  Insecurity (07/26/2023)  Housing: Low Risk  (07/26/2023)  Transportation Needs: No Transportation Needs (07/26/2023)  Utilities: Not At Risk (07/26/2023)  Alcohol Screen: Low Risk  (06/02/2023)  Depression (PHQ2-9): Medium Risk (06/02/2023)  Financial Resource Strain: Low Risk  (06/02/2023)  Physical Activity: Sufficiently Active (06/02/2023)  Recent Concern: Physical Activity - Insufficiently Active (03/18/2023)  Social Connections: Moderately Integrated (07/26/2023)  Stress: Stress Concern Present (06/02/2023)  Tobacco Use: Medium Risk (07/26/2023)  Health Literacy: Adequate Health Literacy (06/02/2023)   SDOH Interventions:     Readmission Risk  Interventions     No data to display

## 2023-07-28 ENCOUNTER — Inpatient Hospital Stay (HOSPITAL_COMMUNITY)

## 2023-07-28 DIAGNOSIS — I63531 Cerebral infarction due to unspecified occlusion or stenosis of right posterior cerebral artery: Secondary | ICD-10-CM | POA: Diagnosis not present

## 2023-07-28 LAB — CBC
HCT: 33 % — ABNORMAL LOW (ref 36.0–46.0)
Hemoglobin: 10.6 g/dL — ABNORMAL LOW (ref 12.0–15.0)
MCH: 29.7 pg (ref 26.0–34.0)
MCHC: 32.1 g/dL (ref 30.0–36.0)
MCV: 92.4 fL (ref 80.0–100.0)
Platelets: 354 10*3/uL (ref 150–400)
RBC: 3.57 MIL/uL — ABNORMAL LOW (ref 3.87–5.11)
RDW: 12.7 % (ref 11.5–15.5)
WBC: 7.6 10*3/uL (ref 4.0–10.5)
nRBC: 0 % (ref 0.0–0.2)

## 2023-07-28 LAB — BASIC METABOLIC PANEL
Anion gap: 10 (ref 5–15)
BUN: 11 mg/dL (ref 8–23)
CO2: 25 mmol/L (ref 22–32)
Calcium: 9.3 mg/dL (ref 8.9–10.3)
Chloride: 105 mmol/L (ref 98–111)
Creatinine, Ser: 1.02 mg/dL — ABNORMAL HIGH (ref 0.44–1.00)
GFR, Estimated: 60 mL/min — ABNORMAL LOW (ref >60)
Glucose, Bld: 93 mg/dL (ref 70–99)
Potassium: 4.4 mmol/L (ref 3.5–5.1)
Sodium: 140 mmol/L (ref 135–145)

## 2023-07-28 MED ORDER — PHENYLEPHRINE HCL-NACL 20-0.9 MG/250ML-% IV SOLN
INTRAVENOUS | Status: AC
  Filled 2023-07-28: qty 500

## 2023-07-28 MED ORDER — CLOPIDOGREL BISULFATE 75 MG PO TABS: 75.0000 mg | ORAL_TABLET | Freq: Every day | ORAL | 0 refills | Status: DC

## 2023-07-28 MED ORDER — CLOPIDOGREL BISULFATE 75 MG PO TABS
75.0000 mg | ORAL_TABLET | Freq: Every day | ORAL | Status: DC
  Administered 2023-07-28: 75 mg via ORAL
  Filled 2023-07-28: qty 1

## 2023-07-28 NOTE — Consult Note (Signed)
 [facility] Liaison Consult Note   07/28/2023  [name] July 02, 1955 [id number]  Insurance: [employer]   Primary Care Provider: [name], MD, with [facility], this provider is listed for the transition of care follow up appointments  and community [facility] calls   [facility] Liaison spoke with patient admitted to [facility] via bedside telephone. HIPAA verified and explained reason for call for post hospital needs.  Patient states she is supposed to go home this evening however still waiting on the doctor. Explained to anticipate post hospital nurse to follow up on after hospital care needs. Patient verbalized understanding and is receptive.   The patient was screened for 30 day readmission hospitalization with noted medium risk score for unplanned readmission risk 2ED visits and 2 hospital admissions in 6 months.  The patient was assessed for potential [facility] Coordination service needs for post hospital transition for care coordination. Review of patient's electronic medical record reveals patient is admitted with non-traumatic intracranial hemorrhage with recent stroke .   Plan: [facility] Liaison will continue to follow progress and disposition to assess for post hospital community care coordination/management needs.  Referral request for community care coordination: Anticipated VBCI TOC follow up post hospitalization.   VBCI Community Care, Population Health does not replace or interfere with any arrangements made by the Inpatient Transition of Care team.   For questions contact:   Charlesetta Shanks, RN, BSN, CCM  [place]  [facility], [facility] Liaison Direct Dial: [phone] or secure chat Email: [email or web address]

## 2023-07-28 NOTE — Progress Notes (Addendum)
 Escorted patient via wheel chair to private automobile for discharge.  Floor RN printed and reviewed AVS, Asked patient if they had any questions. Patient had no questions.

## 2023-07-28 NOTE — Progress Notes (Signed)
 Physical Therapy Treatment Patient Details Name: [name] MRN: [id number] DOB: February 24, 1956 Today's Date: 07/28/2023   History of Present Illness Patient is a 68 y/o female admitted 07/26/23 with sudden headache and nose bleeds.  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  Recent admission for stenting of bilateral common iliac arteries on 07/11/23 and on dual antiplatelets until CFA DVT found and started on Eliquis also.  She underwent IVC filter placement with R groin access on 07/26/23.  PMH positive for hyperlipidemia, HH, HTN, pre-diabetes, carotid stenosis, osteoporosis, Schatzki's ring and seasonal allergies.    PT Comments  Pt continues to demo good mobility progression. Mod I bed mobility and transfers. Supervision amb 350' without AD. Supervision ascend/descend 12 steps with R rail. Pt scored 22/24 on DGI balance assessment. PT to continue to follow acutely. No follow up services or DME needed.     If plan is discharge home, recommend the following: Assist for transportation;Help with stairs or ramp for entrance   Can travel by private vehicle        Equipment Recommendations  None recommended by PT    Recommendations for Other Services       Precautions / Restrictions Precautions Precautions: Fall Precaution/Restrictions Comments: SBP <160     Mobility  Bed Mobility Overal bed mobility: Modified Independent                  Transfers Overall transfer level: Modified independent Equipment used: None                    Ambulation/Gait Ambulation/Gait assistance: Supervision Gait Distance (Feet): 350 Feet Assistive device: None Gait Pattern/deviations: Decreased stride length, Step-through pattern Gait velocity: decreased Gait velocity interpretation: 1.31 - 2.62 ft/sec, indicative of limited community ambulator   General Gait Details: steady gait without AD   Stairs Stairs: Yes Stairs assistance: Supervision Stair  Management: One rail Right, Forwards, Alternating pattern Number of Stairs: 12     Wheelchair Mobility     Tilt Bed    Modified Rankin (Stroke Patients Only) Modified Rankin (Stroke Patients Only) Pre-Morbid Rankin Score: No symptoms Modified Rankin: Slight disability     Balance                                 Standardized Balance Assessment Standardized Balance Assessment : Dynamic Gait Index   Dynamic Gait Index Level Surface: Normal Change in Gait Speed: Normal Gait with Horizontal Head Turns: Mild Impairment Gait with Vertical Head Turns: Normal Gait and Pivot Turn: Normal Step Over Obstacle: Normal Step Around Obstacles: Mild Impairment Steps: Normal Total Score: 22      Communication Communication Communication: No apparent difficulties  Cognition Arousal: Alert Behavior During Therapy: WFL for tasks assessed/performed   PT - Cognitive impairments: No apparent impairments                         Following commands: Intact      Cueing    Exercises      General Comments        Pertinent Vitals/Pain Pain Assessment Pain Assessment: No/denies pain    Home Living                          Prior Function            PT  Goals (current goals can now be found in the care plan section) Acute Rehab PT Goals Patient Stated Goal: return to independent Progress towards PT goals: Progressing toward goals    Frequency    Min 3X/week      PT Plan      Co-evaluation              AM-PAC PT "6 Clicks" Mobility   Outcome Measure  Help needed turning from your back to your side while in a flat bed without using bedrails?: None Help needed moving from lying on your back to sitting on the side of a flat bed without using bedrails?: None Help needed moving to and from a bed to a chair (including a wheelchair)?: None Help needed standing up from a chair using your arms (e.g., wheelchair or bedside chair)?:  None Help needed to walk in hospital room?: A Little Help needed climbing 3-5 steps with a railing? : A Little 6 Click Score: 22    End of Session Equipment Utilized During Treatment: Gait belt Activity Tolerance: Patient tolerated treatment well Patient left: in chair;with call bell/phone within reach Nurse Communication: Mobility status PT Visit Diagnosis: Other symptoms and signs involving the nervous system (R29.898)     Time: 1610-9604 PT Time Calculation (min) (ACUTE ONLY): 13 min  Charges:    $Gait Training: 8-22 mins PT General Charges $$ ACUTE PT VISIT: 1 Visit                     Ferd Glassing., PT  Office # [phone]    [name] 07/28/2023, 8:25 AM

## 2023-07-28 NOTE — TOC Transition Note (Signed)
 Transition of Care [facility]) - Discharge Note   Patient Details  Name: [name] MRN: [id number] Date of Birth: [birth year]  [facility]) CM/SW Contact:  Gordy Clement, RN Phone Number: 07/28/2023, 3:58 PM   Clinical Narrative:     Patient will DC to home today. No TOC needs identified Patient will follow up as directed on AVS. Family to transport           Patient Goals and CMS Choice            Discharge Placement                       Discharge Plan and Services Additional resources added to the After Visit Summary for                                       Social Drivers of Health (SDOH) Interventions SDOH Screenings   Food Insecurity: No Food Insecurity (07/27/2023)  Housing: Low Risk  (07/27/2023)  Transportation Needs: No Transportation Needs (07/27/2023)  Utilities: Not At Risk (07/27/2023)  Alcohol Screen: Low Risk  (06/02/2023)  Depression (PHQ2-9): Medium Risk (06/02/2023)  Financial Resource Strain: Low Risk  (06/02/2023)  Physical Activity: Sufficiently Active (06/02/2023)  Recent Concern: Physical Activity - Insufficiently Active (03/18/2023)  Social Connections: Unknown (07/27/2023)  Stress: Stress Concern Present (06/02/2023)  Tobacco Use: Medium Risk (07/26/2023)  Health Literacy: Adequate Health Literacy (06/02/2023)     Readmission Risk Interventions     No data to display

## 2023-07-28 NOTE — Evaluation (Signed)
 Occupational Therapy Evaluation Patient Details Name: [name] MRN: [id number] DOB: 1956/03/26 Today's Date: 07/28/2023   History of Present Illness   Patient is a 68 y/o female admitted 07/26/23 with sudden headache and nose bleeds.  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  Recent admission for stenting of bilateral common iliac arteries on 07/11/23 and on dual antiplatelets until CFA DVT found and started on Eliquis also.  She underwent IVC filter placement with R groin access on 07/26/23.  PMH positive for hyperlipidemia, HH, HTN, pre-diabetes, carotid stenosis, osteoporosis, Schatzki's ring and seasonal allergies.     Clinical Impressions Pt feeling better, no complaints. Pt lives at home with brother and granddaughter, PLOF independent. Pt close to or at baseline at this time, independent with ambulation around room, able to perform ADLs independently, no c/o of pain, good overall strength. Pt has no acute OT needs, no OT follow up needed.      If plan is discharge home, recommend the following:         Functional Status Assessment   Patient has had a recent decline in their functional status and demonstrates the ability to make significant improvements in function in a reasonable and predictable amount of time.     Equipment Recommendations   None recommended by OT     Recommendations for Other Services         Precautions/Restrictions   Precautions Precautions: Fall Recall of Precautions/Restrictions: Intact Precaution/Restrictions Comments: SBP <160 Restrictions Weight Bearing Restrictions Per Provider Order: No     Mobility Bed Mobility Overal bed mobility: Modified Independent                  Transfers Overall transfer level: Independent                        Balance Overall balance assessment: Needs assistance   Sitting balance-Leahy Scale: Good       Standing balance-Leahy Scale: Good                              ADL either performed or assessed with clinical judgement   ADL Overall ADL's : Independent                                             Vision Baseline Vision/History: 0 No visual deficits Ability to See in Adequate Light: 1 Impaired Patient Visual Report: Peripheral vision impairment;Other (comment) (recent stroke L peripheral vision loss)       Perception         Praxis         Pertinent Vitals/Pain Pain Assessment Pain Assessment: No/denies pain     Extremity/Trunk Assessment Upper Extremity Assessment Upper Extremity Assessment: Overall WFL for tasks assessed           Communication Communication Communication: No apparent difficulties Factors Affecting Communication: Hearing impaired   Cognition Arousal: Alert Behavior During Therapy: WFL for tasks assessed/performed Cognition: No apparent impairments                               Following commands: Intact       Cueing  General Comments          Exercises  Shoulder Instructions      Home Living Family/patient expects to be discharged to:: Private residence Living Arrangements: Other relatives (brother) Available Help at Discharge: Family;Available PRN/intermittently Type of Home: House Home Access: Stairs to enter [employer] of Steps: 4 Entrance Stairs-Rails: Right;Left Home Layout: One level     Bathroom Shower/Tub: Producer, television/film/video: Standard     Home Equipment: Grab bars - tub/shower   Additional Comments: Pt lives with brother, has granddaughter and other family who are in/out thorughout the day      Prior Functioning/Environment Prior Level of Function : Independent/Modified Independent             Mobility Comments: retired, cooks, Lexicographer, and drives      OT Problem List: Pain;Decreased activity tolerance   OT Treatment/Interventions:        OT Goals(Current goals can  be found in the care plan section)   Acute Rehab OT Goals Patient Stated Goal: to return home OT Goal Formulation: With patient Time For Goal Achievement: 08/11/23 Potential to Achieve Goals: Good   OT Frequency:       Co-evaluation              AM-PAC OT "6 Clicks" Daily Activity     Outcome Measure Help from another person eating meals?: None Help from another person taking care of personal grooming?: None Help from another person toileting, which includes using toliet, bedpan, or urinal?: None Help from another person bathing (including washing, rinsing, drying)?: None Help from another person to put on and taking off regular upper body clothing?: None Help from another person to put on and taking off regular lower body clothing?: None 6 Click Score: 24   End of Session Equipment Utilized During Treatment: Gait belt Nurse Communication: Mobility status  Activity Tolerance: Patient tolerated treatment well Patient left: in chair;with call bell/phone within reach;with family/visitor present  OT Visit Diagnosis: Pain                Time: 1208-1223 OT Time Calculation (min): 15 min Charges:  OT General Charges $OT Visit: 1 Visit OT Evaluation $OT Eval Low Complexity: 1 Low  [address] OTR/L   Alexis Goodell 07/28/2023, 1:13 PM

## 2023-07-28 NOTE — Plan of Care (Signed)
 Patient is A&O x 4. VSS, continues on room air. Sinus rhythm on the telemetry monitor. Neuro's intact. NIH score 1. C/o headaches and prn tylenol given with positive effect. Pt ambulating independently in the room with steady gait.  Safety maintained. Call bell in reach. Will continue to monitor.   Problem: Education: Goal: Knowledge of disease or condition will improve Outcome: Progressing Goal: Knowledge of secondary prevention will improve (MUST DOCUMENT ALL) Outcome: Progressing Goal: Knowledge of patient specific risk factors will improve (DELETE if not current risk factor) Outcome: Progressing   Problem: Intracerebral Hemorrhage Tissue Perfusion: Goal: Complications of Intracerebral Hemorrhage will be minimized Outcome: Progressing   Problem: Coping: Goal: Will verbalize positive feelings about self Outcome: Progressing Goal: Will identify appropriate support needs Outcome: Progressing   Problem: Health Behavior/Discharge Planning: Goal: Ability to manage health-related needs will improve Outcome: Progressing Goal: Goals will be collaboratively established with patient/family Outcome: Progressing   Problem: Self-Care: Goal: Ability to participate in self-care as condition permits will improve Outcome: Progressing Goal: Verbalization of feelings and concerns over difficulty with self-care will improve Outcome: Progressing Goal: Ability to communicate needs accurately will improve Outcome: Progressing   Problem: Nutrition: Goal: Risk of aspiration will decrease Outcome: Progressing Goal: Dietary intake will improve Outcome: Progressing   Problem: Education: Goal: Knowledge of General Education information will improve Description: Including pain rating scale, medication(s)/side effects and non-pharmacologic comfort measures Outcome: Progressing   Problem: Health Behavior/Discharge Planning: Goal: Ability to manage health-related needs will improve Outcome:  Progressing   Problem: Clinical Measurements: Goal: Ability to maintain clinical measurements within normal limits will improve Outcome: Progressing Goal: Will remain free from infection Outcome: Progressing Goal: Diagnostic test results will improve Outcome: Progressing Goal: Respiratory complications will improve Outcome: Progressing Goal: Cardiovascular complication will be avoided Outcome: Progressing   Problem: Activity: Goal: Risk for activity intolerance will decrease Outcome: Progressing   Problem: Nutrition: Goal: Adequate nutrition will be maintained Outcome: Progressing   Problem: Coping: Goal: Level of anxiety will decrease Outcome: Progressing   Problem: Elimination: Goal: Will not experience complications related to bowel motility Outcome: Progressing Goal: Will not experience complications related to urinary retention Outcome: Progressing   Problem: Pain Managment: Goal: General experience of comfort will improve and/or be controlled Outcome: Progressing   Problem: Safety: Goal: Ability to remain free from injury will improve Outcome: Progressing   Problem: Skin Integrity: Goal: Risk for impaired skin integrity will decrease Outcome: Progressing

## 2023-07-28 NOTE — Discharge Summary (Addendum)
 Stroke Discharge Summary  Patient ID: ebba goll   MRN: [id number]      DOB: July 23, 1955  Date of Admission: 07/26/2023 Date of Discharge: 07/28/2023  Attending Physician:  Stroke, Md, MD Consultant(s):   Treatment Team:  Maeola Harman, MD  Patient's PCP:  [name], MD  DISCHARGE PRIMARY DIAGNOSIS:  Hemorrhagic transformation of recent PCA infarct   Epistaxis, resolved  Secondary Diagnoses: History of stroke DVT Hypertension Hyperlipidemia CAD PVD status post stenting  Allergies as of 07/28/2023       Reactions   Ciprofloxacin Shortness Of Breath   Latex Other (See Comments)   Burns skin   Vitamin C [bioflavonoid Products] Itching        Medication List     STOP taking these medications    ALPRAZolam 0.5 MG tablet Commonly known as: XANAX   apixaban 5 MG Tabs tablet Commonly known as: ELIQUIS   aspirin EC 81 MG tablet   Eliquis DVT/PE Starter Pack Generic drug: Apixaban Starter Pack (10mg  and 5mg )   hydrOXYzine 25 MG capsule Commonly known as: VISTARIL   Trulance 3 MG Tabs Generic drug: Plecanatide       TAKE these medications    acetaminophen 500 MG tablet Commonly known as: TYLENOL Take 500 mg by mouth every 6 (six) hours as needed for moderate pain or headache.   alendronate 70 MG tablet Commonly known as: FOSAMAX TAKE 1 TABLET(70 MG) BY MOUTH EVERY 7 DAYS WITH A FULL GLASS OF WATER AND ON AN EMPTY STOMACH   amLODipine 10 MG tablet Commonly known as: NORVASC Take 10 mg by mouth at bedtime.   B-12 2500 MCG Tabs Take 2,500 mcg by mouth daily.   carvedilol 3.125 MG tablet Commonly known as: COREG TAKE 1 TABLET(3.125 MG) BY MOUTH TWICE DAILY   clopidogrel 75 MG tablet Commonly known as: PLAVIX Take 1 tablet (75 mg total) by mouth daily.   CoQ10 100 MG Caps Take 100 mg by mouth daily.   CRANBERRY PO Take 1 each by mouth 2 (two) times a week. Chewables   dexlansoprazole 60 MG capsule Commonly known  as: DEXILANT Take 1 capsule (60 mg total) by mouth daily.   EPINEPHrine 0.3 mg/0.3 mL Soaj injection Commonly known as: EPI-PEN Inject 0.3 mg into the muscle as needed for anaphylaxis.   fluticasone 50 MCG/ACT nasal spray Commonly known as: FLONASE Place 2 sprays into both nostrils daily as needed for allergies.   hydroquinone 4 % cream Apply 1 application topically 2 (two) times daily as needed (dark spots).   ipratropium 0.03 % nasal spray Commonly known as: ATROVENT INSTILL 2 SPRAYS IN EACH NOSTRIL EVERY 12 HOURS What changed: See the new instructions.   loratadine 10 MG tablet Commonly known as: CLARITIN Take 10 mg by mouth daily as needed for allergies.   MAGNESIUM PO Take 330 mg by mouth daily.   ondansetron 4 MG tablet Commonly known as: ZOFRAN TAKE 1 TABLET(4 MG) BY MOUTH EVERY 8 HOURS AS NEEDED FOR NAUSEA OR VOMITING   rosuvastatin 20 MG tablet Commonly known as: Crestor Take 1 tablet (20 mg total) by mouth daily.   sodium chloride 0.65 % Soln nasal spray Commonly known as: OCEAN Place 1 spray into both nostrils as needed for congestion.   triamcinolone cream 0.1 % Commonly known as: KENALOG Apply 1 Application topically 2 (two) times daily. What changed:  when to take this reasons to take this   Vitamin D 50 MCG (  2000 UT) Caps Take 2,000 Units by mouth daily.   vitamin E 180 MG (400 UNITS) capsule Take 400 Units by mouth 3 (three) times a week.   zinc gluconate 50 MG tablet Take 50 mg by mouth 3 (three) times a week.        LABORATORY STUDIES CBC    Component Value Date/Time   WBC 7.6 07/28/2023 0641   RBC 3.57 (L) 07/28/2023 0641   HGB 10.6 (L) 07/28/2023 0641   HGB 12.8 11/16/2012 0000   HCT 33.0 (L) 07/28/2023 0641   HCT 38 11/16/2012 0000   PLT 354 07/28/2023 0641   MCV 92.4 07/28/2023 0641   MCV 86.2 11/16/2012 0000   MCH 29.7 07/28/2023 0641   MCHC 32.1 07/28/2023 0641   RDW 12.7 07/28/2023 0641   LYMPHSABS 2.6 07/26/2023 0527    MONOABS 0.4 07/26/2023 0527   EOSABS 0.3 07/26/2023 0527   BASOSABS 0.0 07/26/2023 0527   CMP    Component Value Date/Time   NA 140 07/28/2023 0641   NA 141 11/16/2012 0000   K 4.4 07/28/2023 0641   K 4.1 11/16/2012 0000   CL 105 07/28/2023 0641   CO2 25 07/28/2023 0641   GLUCOSE 93 07/28/2023 0641   BUN 11 07/28/2023 0641   BUN 11 11/16/2012 0000   CREATININE 1.02 (H) 07/28/2023 0641   CREATININE 0.87 07/22/2023 1030   CALCIUM 9.3 07/28/2023 0641   CALCIUM 9.7 11/16/2012 0000   PROT 6.9 07/26/2023 0559   ALBUMIN 3.6 07/26/2023 0559   ALBUMIN 4.4 11/16/2012 0000   AST 19 07/26/2023 0559   AST 18 11/16/2012 0000   ALT 14 07/26/2023 0559   ALKPHOS 55 07/26/2023 0559   ALKPHOS 77 11/16/2012 0000   BILITOT 0.9 07/26/2023 0559   BILITOT 0.8 11/16/2012 0000   GFRNONAA 60 (L) 07/28/2023 0641   GFRNONAA 64 06/10/2020 1541   GFRAA 74 06/10/2020 1541   COAGS Lab Results  Component Value Date   INR 1.2 07/26/2023   INR 1.01 09/15/2014   Lipid Panel    Component Value Date/Time   CHOL 146 07/12/2023 0505   TRIG 55 07/27/2023 0506   HDL 60 07/12/2023 0505   CHOLHDL 2.4 07/12/2023 0505   VLDL 11 07/12/2023 0505   LDLCALC 75 07/12/2023 0505   LDLCALC 94 06/11/2022 1100   HgbA1C  Lab Results  Component Value Date   HGBA1C 6.0 (H) 07/12/2023   Alcohol Level    Component Value Date/Time   ETH <10 07/26/2023 0559   SIGNIFICANT DIAGNOSTIC STUDIES CT HEAD WO CONTRAST ( ) Result Date: 07/28/2023 CLINICAL DATA:  Hemorrhagic infarct of the right occipital lobe. EXAM: CT HEAD WITHOUT CONTRAST TECHNIQUE: Contiguous axial images were obtained from the base of the skull through the vertex without intravenous contrast. RADIATION DOSE REDUCTION: This exam was performed according to the departmental dose-optimization program which includes automated exposure control, adjustment of the mA and/or kV according to patient size and/or use of iterative reconstruction technique.  COMPARISON:  CT head without contrast 07/26/2018. MR head and CT angio head and neck 07/12/2023. FINDINGS: Brain: Hemorrhagic infarct of the posteroinferior right occipital lobe is stable to slightly decreased in size since the prior exam. No new or expanding hemorrhage is present. Mild surrounding edema has decreased slightly. No new infarct or hemorrhage is present. No other significant white matter disease is present. Deep brain nuclei are within normal limits. The ventricles are of normal size. No significant extraaxial fluid collection is present. The brainstem and  cerebellum are within normal limits. 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 Vascular: Atherosclerotic calcifications are present within the cavernous internal carotid arteries bilaterally. Calcifications are present at the dural margin of the right vertebral artery. No hyperdense vessel is present. Skull: No significant extracranial soft tissue lesion is present. Calvarium is intact. No focal lytic or blastic lesions are present. Sinuses/Orbits: The paranasal sinuses and mastoid air cells are clear. Bilateral lens replacements are noted. Globes and orbits are otherwise unremarkable. IMPRESSION: 1. Stable to slightly decreased size of hemorrhagic infarct of the posteroinferior right occipital lobe. 2. No new or expanding hemorrhage. 3. No new infarct or hemorrhage. 4. Expanded empty sella. This is nonspecific, but can be seen in the setting of idiopathic intracranial hypertension. Electronically Signed   By: Marin Roberts M.D.   On: 07/28/2023 15:09   VAS [place] LOWER EXTREMITY VENOUS (DVT) (ONLY [facility] & [facility]) Result Date: 07/26/2023  Lower Venous DVT Study Patient Name:  LEASA KINCANNON  Date of Exam:   07/26/2023 Medical Rec #: [id number]           Accession #:    [id number] Date of Birth: [birth year]           Patient Gender: F Patient Age:   20 years Exam Location:  [facility] Procedure:      VAS [place] LOWER EXTREMITY VENOUS (DVT) Referring  Phys: Dione Booze --------------------------------------------------------------------------------  Indications: DVT.  Risk Factors: DVT LT CFV/SFJ 07/19/23 Surgery LT Cath past pregnancy. Anticoagulation: Eliquis. Comparison Study: Improved since previous exam 07/19/23 Performing Technologist: Shona Simpson  Examination Guidelines: A complete evaluation includes B-mode imaging, spectral Doppler, color Doppler, and power Doppler as needed of all accessible portions of each vessel. Bilateral testing is considered an integral part of a complete examination. Limited examinations for reoccurring indications may be performed as noted. The reflux portion of the exam is performed with the patient in reverse Trendelenburg.  +-----+---------------+---------+-----------+----------+--------------+ RIGHTCompressibilityPhasicitySpontaneityPropertiesThrombus Aging +-----+---------------+---------+-----------+----------+--------------+ CFV  Full           Yes      Yes                                 +-----+---------------+---------+-----------+----------+--------------+   +--------+---------------+---------+-----------+----------------+-------------+ LEFT    CompressibilityPhasicitySpontaneityProperties      Thrombus                                                                 Aging         +--------+---------------+---------+-----------+----------------+-------------+ CFV     Partial        Yes      Yes        rigid           Acute                                                    w/compression                 +--------+---------------+---------+-----------+----------------+-------------+ SFJ     Partial  Yes        rigid           Acute                                                    w/compression                 +--------+---------------+---------+-----------+----------------+-------------+ FV Prox Full                                                              +--------+---------------+---------+-----------+----------------+-------------+ FV Mid  Full                                                             +--------+---------------+---------+-----------+----------------+-------------+ FV      Full                                                             Distal                                                                   +--------+---------------+---------+-----------+----------------+-------------+ PFV     Full                                                             +--------+---------------+---------+-----------+----------------+-------------+ POP     Full           Yes      Yes                                      +--------+---------------+---------+-----------+----------------+-------------+ PTV     Full                                                             +--------+---------------+---------+-----------+----------------+-------------+ PERO    Full                                                             +--------+---------------+---------+-----------+----------------+-------------+  Summary: RIGHT: - No evidence of common femoral vein obstruction.   LEFT: - Findings consistent with acute deep vein thrombosis involving the left common femoral vein, and SF junction.  - Findings appear improved from previous examination. - Thrombus is significantly smaller than previously seen.  *See table(s) above for measurements and observations. Electronically signed by Lemar Livings MD on 07/26/2023 at 3:43:58 PM.    Final    HYBRID OR IMAGING ([facility] ONLY) Result Date: 07/26/2023 There is no interpretation for this exam.  This order is for images obtained during a surgical procedure.  Please See "Surgeries" Tab for more information regarding the procedure.   CT HEAD POST STROKE FOLLOWUP/TIMED/STAT READ Result Date: 07/26/2023 CLINICAL DATA:  Code stroke. EXAM: CT HEAD WITHOUT CONTRAST  TECHNIQUE: Contiguous axial images were obtained from the base of the skull through the vertex without intravenous contrast. RADIATION DOSE REDUCTION: This exam was performed according to the departmental dose-optimization program which includes automated exposure control, adjustment of the mA and/or kV according to patient size and/or use of iterative reconstruction technique. COMPARISON:  Head CT from earlier the same day FINDINGS: Brain: Subacute right PCA territory infarct with generalized superficial based hemorrhage is non progressed, hemorrhagic area measuring up to 4.4 cm anterior to posterior. No new site of infarct or hemorrhage. Chronic small vessel infarct at the right caudate head. No hydrocephalus or shift. Vascular: No hyperdense vessel or unexpected calcification. Skull: Normal. Negative for fracture or focal lesion. Sinuses/Orbits: No acute finding. Other: Prelim sent in epic chat. IMPRESSION: No interval progression of recent infarct with hemorrhage in the right occipital lobe. Electronically Signed   By: Tiburcio Pea M.D.   On: 07/26/2023 09:00   CT HEAD WO CONTRAST ( ) Addendum Date: 07/26/2023 ADDENDUM REPORT: 07/26/2023 06:21 ADDENDUM: Study discussed by telephone with Dr. Dione Booze in the ED on 07/26/2023 at 0608 hours. Electronically Signed   By: Odessa Fleming M.D.   On: 07/26/2023 06:21   Result Date: 07/26/2023 CLINICAL DATA:  68 year old female with new onset headache at 2300 hours, epistaxis. On Eliquis and Plavix. Recent peripheral vascular stent placement. Recent right PCA territory infarct this month. EXAM: CT HEAD WITHOUT CONTRAST TECHNIQUE: Contiguous axial images were obtained from the base of the skull through the vertex without intravenous contrast. RADIATION DOSE REDUCTION: This exam was performed according to the departmental dose-optimization program which includes automated exposure control, adjustment of the mA and/or kV according to patient size and/or use of iterative  reconstruction technique. COMPARISON:  Brain MRI 07/12/2023.  Head CT 07/12/2023. FINDINGS: Brain: Hemorrhagic transformation of the right PCA infarct diagnosed on 07/12/2023 (series 3, image 16). Hemorrhage is confluent but petechial type, with only mild regional mass effect and no significant intracranial mass effect or midline shift overall. No intraventricular or extra-axial extension is identified. Stable underlying cerebral volume and extent of the right PCA infarct is stable. Stable gray-white differentiation elsewhere. No ventriculomegaly. Normal basilar cisterns. Vascular: Calcified atherosclerosis at the skull base. No suspicious intracranial vascular hyperdensity. Skull: Stable, intact. Sinuses/Orbits: Increased but generally mild paranasal sinus mucosal thickening. No sinus fluid levels. Tympanic cavities and mastoids remain well aerated. Other: Visualized orbits and scalp soft tissues are stable, within normal limits. IMPRESSION: 1. Positive for hemorrhagic transformation of the Right PCA infarct since 07/12/2023, but confluent petechial type rather than malignant intracranial hemorrhage. No significant intracranial mass effect or other complicating features at this time. 2. Stable extent of the underlying PCA infarct. No other acute intracranial abnormality. Electronically Signed: By:  Odessa Fleming M.D. On: 07/26/2023 05:54   VAS [place] GROIN PSEUDOANEURYSM Result Date: 07/19/2023  ARTERIAL PSEUDOANEURYSM  Patient Name:  YARELY BEBEE  Date of Exam:   07/19/2023 Medical Rec #: [id number]           Accession #:    [id number] Date of Birth: 1955/06/12           Patient Gender: F Patient Age:   32 years Exam Location:  [facility] Procedure:      VAS [place] Bobetta Lime Referring Phys: Emilie Rutter --------------------------------------------------------------------------------  Exam: Bilateral groins Indications: Patient complains of groin pain and palpable knot. History: S/p  catheterization. Comparison Study: No prior study Performing Technologist: Gertie Fey MHA, RDMS, RVT, RDCS  Examination Guidelines: A complete evaluation includes B-mode imaging, spectral Doppler, color Doppler, and power Doppler as needed of all accessible portions of each vessel. Bilateral testing is considered an integral part of a complete examination. Limited examinations for reoccurring indications may be performed as noted. +------------+----------+--------+------+----------+ Right DuplexPSV (cm/s)WaveformPlaqueComment(s) +------------+----------+--------+------+----------+ Ext.Iliac      203    biphasic                 +------------+----------+--------+------+----------+ CFA            144    biphasic                 +------------+----------+--------+------+----------+ PFA            107    biphasic                 +------------+----------+--------+------+----------+ Prox SFA        60    biphasic                 +------------+----------+--------+------+----------+ Right Vein comments:Patent CFV +-----------+----------+--------+------+----------+ Left DuplexPSV (cm/s)WaveformPlaqueComment(s) +-----------+----------+--------+------+----------+ Ext Iliac     108    biphasic                 +-----------+----------+--------+------+----------+ CFA           133    biphasic                 +-----------+----------+--------+------+----------+ PFA           117    biphasic                 +-----------+----------+--------+------+----------+ Prox SFA      147    biphasic                 +-----------+----------+--------+------+----------+ Left Vein comments: Hyperacute DVT involving the CFV and proximal femoral vein. 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  Summary: No evidence of pseudoaneurysm, or AVF. Hyperacute DVT involving the CFV and proximal femoral vein. Supercial vein thrombosis involving the great saphenous  vein at the SFJ.  Diagnosing physician: Heath Lark Electronically signed by Heath Lark on 07/19/2023 at 4:41:49 PM.   --------------------------------------------------------------------------------    Final    CT ANGIO HEAD NECK W WO CM Result Date: 07/13/2023 CLINICAL DATA:  Stroke/TIA, determine embolic source EXAM: CT ANGIOGRAPHY HEAD AND NECK WITH AND WITHOUT CONTRAST TECHNIQUE: Multidetector CT imaging of the head and neck was performed using the standard protocol during bolus administration of intravenous contrast. Multiplanar CT image reconstructions and MIPs were obtained to evaluate the vascular anatomy. Carotid stenosis measurements (when applicable) are obtained utilizing NASCET criteria, using the distal internal carotid diameter as the denominator. RADIATION DOSE REDUCTION: This exam  was performed according to the departmental dose-optimization program which includes automated exposure control, adjustment of the mA and/or kV according to patient size and/or use of iterative reconstruction technique. CONTRAST:  75mL OMNIPAQUE IOHEXOL 350 MG/ML SOLN COMPARISON:  MRI head from today. FINDINGS: CT HEAD FINDINGS Brain: Acute right PCA territory infarct, further characterized on MRI from today. No evidence of acute hemorrhage, mass lesion, midline shift or hydrocephalus. Vascular: See below. Skull: No acute fracture. Sinuses/Orbits: Clear sinuses.  No acute orbital findings. Other: No mastoid effusions. Review of the MIP images confirms the above findings CTA NECK FINDINGS Aortic arch: Great vessel origins are patent aortic atherosclerosis. Right carotid system: Atherosclerosis at the carotid bifurcation involving the proximal ICA with approximately 60% stenosis the ICA origin. Left carotid system: Atherosclerosis at the carotid bifurcation without greater than 50% stenosis. Vertebral arteries: Severe stenosis of the right vertebral artery origin. Both vertebral arteries are patent. Skeleton: No acute  fracture on limited assessment. Other neck: No acute abnormality on limited assessment. Upper chest: Visualized lung apices are clear. Review of the MIP images confirms the above findings CTA HEAD FINDINGS Anterior circulation: Severe stenosis of the right intracranial I see a. Left intracranial ICA is patent without significant stenosis. Bilateral MCAs and ACAs are patent without proximal hemodynamically significant stenosis. Posterior circulation: Bilateral intradural vertebral arteries, basilar artery and bilateral posterior cerebral arteries are patent without proximal hemodynamically significant stenosis. Venous sinuses: As permitted by contrast timing, patent. Review of the MIP images confirms the above findings IMPRESSION: 1. Severe right intracranial ICA stenosis. 2. Severe right vertebral artery origin stenosis. 3. Approximately 60% stenosis of the right ICA origin. 4. Aortic Atherosclerosis (ICD10-I70.0). Electronically Signed   By: Feliberto Harts M.D.   On: 07/13/2023 03:06   ECHOCARDIOGRAM COMPLETE Result Date: 07/12/2023    ECHOCARDIOGRAM REPORT   Patient Name:   ZUZANNA MARONEY Haver Date of Exam: 07/12/2023 Medical Rec #:  [id number]          Height:       67.0 in Accession #:    [id number]         Weight:       156.6 lb Date of Birth:  [birth year]          BSA:          1.823 m Patient Age:    67 years           BP:           133/61 mmHg Patient Gender: F                  HR:           79 bpm. Exam Location:  Inpatient Procedure: 2D Echo, Color Doppler, Cardiac Doppler and Strain Analysis (Both            Spectral and Color Flow Doppler were utilized during procedure). Indications:    Stroke  History:        Patient has prior history of Echocardiogram examinations, most                 recent 02/04/2022. Risk Factors:Hypertension and Diabetes.  Sonographer:    Webb Laws Referring Phys: [id number] Cuong Moorman IMPRESSIONS  1. Left ventricular ejection fraction, by estimation, is 55 to 60%. The left  ventricle has normal function. The left ventricle has no regional wall motion abnormalities. There is mild asymmetric left ventricular hypertrophy of the basal-septal segment. Left ventricular diastolic parameters were normal. The average left  ventricular global longitudinal strain is -23.2 %. The global longitudinal strain is normal.  2. Right ventricular systolic function is normal. The right ventricular size is normal. Tricuspid regurgitation signal is inadequate for assessing PA pressure.  3. The mitral valve is grossly normal. No evidence of mitral valve regurgitation. No evidence of mitral stenosis.  4. The aortic valve is tricuspid. Aortic valve regurgitation is not visualized. No aortic stenosis is present.  5. The inferior vena cava is normal in size with greater than 50% respiratory variability, suggesting right atrial pressure of 3 mmHg. Comparison(s): No significant change from prior study. Conclusion(s)/Recommendation(s): No intracardiac source of embolism detected on this transthoracic study. Consider a transesophageal echocardiogram to exclude cardiac source of embolism if clinically indicated. FINDINGS  Left Ventricle: Left ventricular ejection fraction, by estimation, is 55 to 60%. The left ventricle has normal function. The left ventricle has no regional wall motion abnormalities. The average left ventricular global longitudinal strain is -23.2 %. Strain was performed and the global longitudinal strain is normal. The left ventricular internal cavity size was normal in size. There is mild asymmetric left ventricular hypertrophy of the basal-septal segment. Left ventricular diastolic parameters were  normal. Right Ventricle: The right ventricular size is normal. No increase in right ventricular wall thickness. Right ventricular systolic function is normal. Tricuspid regurgitation signal is inadequate for assessing PA pressure. Left Atrium: Left atrial size was normal in size. Right Atrium: Right  atrial size was normal in size. Pericardium: There is no evidence of pericardial effusion. Mitral Valve: The mitral valve is grossly normal. No evidence of mitral valve regurgitation. No evidence of mitral valve stenosis. Tricuspid Valve: The tricuspid valve is grossly normal. Tricuspid valve regurgitation is trivial. No evidence of tricuspid stenosis. Aortic Valve: The aortic valve is tricuspid. Aortic valve regurgitation is not visualized. No aortic stenosis is present. Pulmonic Valve: The pulmonic valve was grossly normal. Pulmonic valve regurgitation is not visualized. No evidence of pulmonic stenosis. Aorta: The aortic root is normal in size and structure. Venous: The inferior vena cava is normal in size with greater than 50% respiratory variability, suggesting right atrial pressure of 3 mmHg. IAS/Shunts: The atrial septum is grossly normal.  LEFT VENTRICLE PLAX 2D LVIDd:         3.70 cm     Diastology LVIDs:         2.90 cm     LV e' medial:    7.29 cm/s LV PW:         1.00 cm     LV E/e' medial:  6.4 LV IVS:        1.40 cm     LV e' lateral:   8.27 cm/s LVOT diam:     1.90 cm     LV E/e' lateral: 5.6 LV SV:         51 LV SV Index:   28          2D Longitudinal Strain LVOT Area:     2.84 cm    2D Strain GLS Avg:     -23.2 %  LV Volumes (MOD) LV vol d, MOD A2C: 57.1 ml LV vol d, MOD A4C: 61.4 ml LV vol s, MOD A2C: 30.8 ml LV vol s, MOD A4C: 19.5 ml LV SV MOD A2C:     26.3 ml LV SV MOD A4C:     61.4 ml LV SV MOD BP:      34.3 ml RIGHT VENTRICLE  IVC RV Basal diam:  2.90 cm     IVC diam: 1.00 cm RV S prime:     12.10 cm/s TAPSE (M-mode): 2.4 cm LEFT ATRIUM             Index        RIGHT ATRIUM           Index LA diam:        3.30 cm 1.81 cm/m   RA Area:     11.70 cm LA Vol (A2C):   20.6 ml 11.30 ml/m  RA Volume:   22.70 ml  12.45 ml/m LA Vol (A4C):   34.5 ml 18.93 ml/m LA Biplane Vol: 27.1 ml 14.87 ml/m  AORTIC VALVE LVOT Vmax:   93.80 cm/s LVOT Vmean:  62.600 cm/s LVOT VTI:    0.180 m  AORTA Ao  Root diam: 2.60 cm MITRAL VALVE MV Area (PHT): 4.26 cm    SHUNTS MV Decel Time: 178 msec    Systemic VTI:  0.18 m MV E velocity: 46.50 cm/s  Systemic Diam: 1.90 cm MV A velocity: 91.10 cm/s MV E/A ratio:  0.51 Lennie Odor MD Electronically signed by Lennie Odor MD Signature Date/Time: 07/12/2023/3:26:33 PM    Final    MR BRAIN WO CONTRAST Result Date: 07/12/2023 CLINICAL DATA:  Neuro deficit, acute, stroke suspected. EXAM: MRI HEAD WITHOUT CONTRAST TECHNIQUE: Multiplanar, multiecho pulse sequences of the brain and surrounding structures were obtained without intravenous contrast. COMPARISON:  06/02/2023 FINDINGS: Brain: Diffusion imaging shows acute infarction in the right PCA territory affecting right occipital lobe, posteromedial temporal lobe and portion of the right hippocampus. No evidence of hemorrhage or mass effect. No other acute infarction. The brainstem and cerebellum are normal. Cerebral hemispheres otherwise show low moderate chronic small-vessel ischemic changes of the white matter. No hydrocephalus or extra-axial collection. Vascular: Major vessels at the base of the brain show flow. Skull and upper cervical spine: Negative Sinuses/Orbits: Clear/normal Other: None IMPRESSION: 1. 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 No evidence of hemorrhage or mass effect. 2. Low moderate chronic small-vessel ischemic changes of the cerebral hemispheric white matter. Electronically Signed   By: Paulina Fusi M.D.   On: 07/12/2023 08:50   LONG TERM MONITOR (3-14 DAYS) Result Date: 07/11/2023 Patch Wear Time:  13 days and 19 hours (2025-02-12T03:29:59-0500 to 2025-02-25T22:44:24-0500) Patient had a min HR of 57 bpm, max HR of 132 bpm, and avg HR of 83 bpm. Predominant underlying rhythm was Sinus Rhythm. Isolated SVEs were rare (<1.0%), SVE Couplets were rare (<1.0%), and no SVE Triplets were present. Isolated  VEs were rare (<1.0%), and no VE Couplets or VE Triplets were present. Charlton Haws MD [facility]   PERIPHERAL VASCULAR CATHETERIZATION Result Date: 07/11/2023 Images from the original result were not included. Patient name: ARDENE REMLEY MRN: [id number] DOB: 1955-12-21 Sex: female 07/11/2023 Pre-operative Diagnosis: Atherosclerosis native arteries with right lower extremity ischemic rest pain Post-operative diagnosis:  Same Surgeon:  Luanna Salk. Randie Heinz, MD Procedure Performed: 1.  Percutaneous ultrasound-guided access and Mynx device closure bilateral common femoral arteries 2.  Catheter in aorta and aortogram 3.  Bilateral lower extremity angiography 4.  Catheter selection right common femoral artery from left common femoral approach 5.  Stent of bilateral common iliac arteries with 7 x 39 mm VBX and stent of left external iliac artery with 7 x 60 mm Eluvia 6.  Moderate sedation with fentanyl and Versed for  51 minutes Indications: 69 year old female with right lower extremity rest pain found to have multilevel occlusive disease.  She is now indicated for angiography with possible endovascular intervention. Findings: The proximal aorta was patent with bilateral renal arteries patent.  The distal terminal aorta was heavily calcified there was a subtotal occlusion of the right common iliac artery with heavily diseased greater than 80% stenosis of the left common iliac artery extending down into the left external iliac artery.  The right lower extremity common femoral was patent the SFA is initially diseased and occludes for approximately 20 cm and reconstitutes above the knee with three-vessel runoff dominant via the ankle.  On the left side the common femoral artery is similarly nondiseased and there is a healthy appearing profunda and left SFA in the lower aspect of the left lower extremity was not evaluated.  After stenting the right common iliac artery there was 0% residual stenosis where previously was subtotally  occluded and there is no gradient between the aorta and the external iliac artery with a patent hypogastric.  On the left side after stenting of the left common iliac artery we then extended for persistent gradient of 50 mmHg down to the left external iliac artery with the noncovered drug-eluting stent postdilated with a 7 mm balloon and then there was no gradient from the aorta to the distal external iliac artery. Patient will be reevaluated in 4 to 6 weeks in our office and if she has persistent right lower extremity symptoms would be considered for right common femoral to above-knee popliteal artery bypass with vein versus more likely PTFE.  Procedure:  The patient was identified in the holding area and taken to room 8.  The patient was then placed supine on the table and prepped and draped in the usual sterile fashion.  A time out was called.  Ultrasound was used to evaluate initially the left common femoral artery.  This was free of disease.  The area was anesthetized with 1% lidocaine and cannulated with a micropuncture needle followed by wire and a sheath.  An ultrasound image was saved the permanent record.  Concomitantly we administered fentanyl and Versed as moderate sedation and her vital signs were monitored throughout the case.  We placed a Bentson wire which would not fully track followed by 5 [place] sheath.  We perform retrograde angiography and then using Glidewire advantage and Kumpe catheter were able to track into the aorta and confirmed intraluminal access and an Omni catheter was placed to the level of L1 and aortogram was performed.  We then pulled back to the aortic bifurcation and perform dedicated pelvic angiography and angles.  With the above findings I elected to cross the bifurcation using Glidewire advantage placed catheter in the right common femoral artery and perform right lower extremity angiography.  The SFA did not appear amenable to endovascular intervention given the long segment  occlusion with very diminutive artery proximally.  I elected then to retract the catheter in place a Rosen wire into the aorta from the left common femoral artery.  On the right side ultrasound was then used to identify the right common femoral artery which was also free of disease and this area was anesthetized with 1% lidocaine and the right common femoral artery was then cannulated with a micropuncture needle followed by wire and a sheath.  An ultrasound image again was saved the permanent record.  We placed the Glidewire advantage on this side and using a Kumpe catheter were able to  track through the aorta and confirmed intraluminal access.  Long 6 French sheath were then placed from both sides into the aorta and aortogram was performed which identified the bilateral common iliac artery occlusive disease.  We then deployed two 7 x 39 mm VBX's to nominal pressure.  I completion we performed gradients on the right side there was no gradient on the left side there was a 50 mmHg gradient.  Dedicated RAO angiography was then performed and we extended down into the external iliac artery with a 7 x 60 mm drug-eluting stent postdilated with 7 mm balloon.  At completion there was no residual stenosis identified angiographically and there was no further gradient.  Satisfied with this we exchanged for short 6 French sheath bilaterally and deployed minx devices.  The patient tolerated the procedure without any complication. Contrast: 70cc Brandon C. Randie Heinz, MD [facility] Office: [phone] Pager: [phone]   VAS [place] LOWER EXTREMITY ARTERIAL DUPLEX Result Date: 06/29/2023 LOWER EXTREMITY ARTERIAL DUPLEX STUDY Patient Name:  LAKENA SPARLIN  Date of Exam:   06/29/2023 Medical Rec #: [id number]           Accession #:    [id number] Date of Birth: [birth year]           Patient Gender: F Patient Age:   31 years Exam Location:  [facility] Procedure:      VAS [place] LOWER  EXTREMITY ARTERIAL DUPLEX Referring Phys: Lemar Livings --------------------------------------------------------------------------------  Indications: Claudication, rest pain, peripheral artery disease, and RLE              Claudication. High Risk Factors: Hypertension, hyperlipidemia, past history of smoking.  Current ABI: Right: 0.43              Left: 0.71 Performing Technologist: Criss Rosales RVT  Examination Guidelines: A complete evaluation includes B-mode imaging, spectral Doppler, color Doppler, and power Doppler as needed of all accessible portions of each vessel. Bilateral testing is considered an integral part of a complete examination. Limited examinations for reoccurring indications may be performed as noted. Aorta: +------+-------+----------+----------+--------+--------+-----+       AP (cm)Trans (cm)PSV (cm/s)WaveformThrombusShape +------+-------+----------+----------+--------+--------+-----+ Distal45.00                                            +------+-------+----------+----------+--------+--------+-----+  +-----------+--------+-----+---------------+----------+--------+ RIGHT      PSV cm/sRatioStenosis       Waveform  Comments +-----------+--------+-----+---------------+----------+--------+ CIA Prox   638          75-99% stenosis                   +-----------+--------+-----+---------------+----------+--------+ CIA Distal 127                         monophasic         +-----------+--------+-----+---------------+----------+--------+ EIA Mid    57                          monophasic         +-----------+--------+-----+---------------+----------+--------+ CFA Distal 78                          monophasic         +-----------+--------+-----+---------------+----------+--------+ DFA        82  monophasic         +-----------+--------+-----+---------------+----------+--------+ SFA Prox   50                           monophasic         +-----------+--------+-----+---------------+----------+--------+ SFA Mid    87                          monophasic         +-----------+--------+-----+---------------+----------+--------+ SFA Distal 284          50-74% stenosismonophasic         +-----------+--------+-----+---------------+----------+--------+ POP Prox   37                          monophasic         +-----------+--------+-----+---------------+----------+--------+ POP Distal 47                          monophasic         +-----------+--------+-----+---------------+----------+--------+ ATA Distal 20                          monophasic         +-----------+--------+-----+---------------+----------+--------+ PTA Distal 24                          monophasic         +-----------+--------+-----+---------------+----------+--------+ PERO Distal24                          monophasic         +-----------+--------+-----+---------------+----------+--------+  Summary: Right: - High grade stenosis (75-99%) of the right common iliac artery versus focal occlusion with immediate reconstitution. - 50-74% stenosis of the right distal superficial femoral artery. - Monophasic waveforms observed throughout right lower extremity. - Distal aorta appears patent with normal velocities.  See table(s) above for measurements and observations. Electronically signed by Lemar Livings MD on 06/29/2023 at 12:51:46 PM.    Final    VAS [place] ABI WITH/WO TBI Result Date: 06/29/2023  LOWER EXTREMITY DOPPLER STUDY Patient Name:  ALAYLA DETHLEFS  Date of Exam:   06/29/2023 Medical Rec #: [id number]           Accession #:    [id number] Date of Birth: 1955-07-21           Patient Gender: F Patient Age:   84 years Exam Location:  [facility] Procedure:      VAS [place] ABI WITH/WO TBI Referring Phys: Lemar Livings --------------------------------------------------------------------------------  Indications:  Claudication, rest pain, and peripheral artery disease. RLE              Claudication High Risk Factors: Hypertension, hyperlipidemia, Diabetes.  Comparison Study: 03/09/2023                   Right: Moderate ABI, abnormal TBI                   Left: Mild ABI, abnormal TBI Performing Technologist: Criss Rosales RVT  Examination Guidelines: A complete evaluation includes at minimum, Doppler waveform signals and systolic blood pressure reading at the level of bilateral brachial, anterior tibial, and posterior tibial arteries, when vessel segments are accessible. Bilateral testing is considered an integral part of a complete  examination. Photoelectric Plethysmograph (PPG) waveforms and toe systolic pressure readings are included as required and additional duplex testing as needed. Limited examinations for reoccurring indications may be performed as noted.  ABI Findings: +---------+------------------+-----+----------+--------+ Right    Rt Pressure (mmHg)IndexWaveform  Comment  +---------+------------------+-----+----------+--------+ Brachial 127                                       +---------+------------------+-----+----------+--------+ PTA      55                0.43 monophasic         +---------+------------------+-----+----------+--------+ DP       49                0.39 monophasic         +---------+------------------+-----+----------+--------+ Great Toe59                0.46 Abnormal           +---------+------------------+-----+----------+--------+ +---------+------------------+-----+--------+-------+ Left     Lt Pressure (mmHg)IndexWaveformComment +---------+------------------+-----+--------+-------+ Brachial 126                                    +---------+------------------+-----+--------+-------+ PTA      90                0.71 biphasic        +---------+------------------+-----+--------+-------+ DP       81                0.64 biphasic         +---------+------------------+-----+--------+-------+ Great Toe58                0.46 Abnormal        +---------+------------------+-----+--------+-------+ +-------+-----------+-----------+------------+------------+ ABI/TBIToday's ABIToday's TBIPrevious ABIPrevious TBI +-------+-----------+-----------+------------+------------+ Right  0.43       0.46       0.62        0.49         +-------+-----------+-----------+------------+------------+ Left   0.71       0.46       0.81        0.55         +-------+-----------+-----------+------------+------------+ .   Summary: Right: Resting right ankle-brachial index indicates severe right lower extremity arterial disease. The right toe-brachial index is abnormal. Left: Resting left ankle-brachial index indicates moderate left lower extremity arterial disease. The left toe-brachial index is abnormal. Bilateral ABIs appear decreased compared to prior study on 03/09/2023. Left TBIs appear decreased compared to prior study on 03/09/2023.  *See table(s) above for measurements and observations.  Electronically signed by Lemar Livings MD on 06/29/2023 at 12:51:35 PM.    Final        HISTORY OF PRESENT ILLNESS 68 y.o. female with history of CAD, stenting of bilateral common iliac arteries on 07/11/2023, hypertension, hyperlipidemia, anxiety, headaches, was recently found to have a CFA DVT and was started on Eliquis in addition to aspirin and Plavix admitted for acute onset of headache and nosebleeds.  ICH score 0 NIH on Admission 1  IVC Filter placed 3/18 by Vascular Surgery. Started on Plavix 3/20.  HOSPITAL COURSE Hemorrhagic transformation of recent PCA infarct Etiology: Likely due to triple therapy CT head Positive for hemorrhagic transformation of the Right PCA infarct since 07/12/2023, but confluent petechial type rather than malignant intracranial hemorrhage CT head  repeat No interval progression of recent infarct with hemorrhage in the right  occipital lobe. Repeat CTH 3/20: Stable to slightly decreased size of hemorrhagic infarct of the posteroinferior right occipital lobe. No new or expanding hemorrhage. VTE prophylaxis -SCDs aspirin 81 mg daily, clopidogrel 75 mg daily, and Eliquis (apixaban) daily prior to admission, restart Plavix given recent LE stents on discharge. Plavix OK from Vascular Surgery standpoint Therapy recommendations:  No follow-up Disposition Home  Hx of Stroke/TIA 07/11/2023 occipital lobe infarct post LE stenting procedure. Considered to be related to procedure. EF 55-60%, LDL 75 and A1C 6.0. CTA showed left ICA 60% stenosis. Recommend outpt follow up with [facility]VS. Discharged on DAPT and crestor 20   DVT LE doppler 3/11 - Hyperacute DVT involving the CFV and proximal femoral  vein. Supercial vein thrombosis involving the great saphenous vein at the  SFJ.  Was given eliquis with loading dose 10mg  bid for 7 days and DAPT continued. LE ultrasound this admission - left  acute deep vein thrombosis involving the left common femoral vein, and SF junction  Now off eliquis due to epistaxis and hemorrhagic conversion IVC filter competed 3/18 by Vascular Surgery   Headache Reports intermittent mild headache States "it goes away on its own after a few minutes" Tylenol PRN Fioricet only if headache continues after tylenol administration   Hypertensive Emergency  Home meds: Coreg, resumed  Stable now Off Cleviprex gtt Long-term BP goal normotensive  Hyperlipidemia Home meds: Crestor 20 mg, resumed in hospital LDL 75, goal < 70 Continue statin at discharge   Other Stroke Risk Factors Coronary artery disease PVD s/p stenting Epistaxis, resolved  DISCHARGE EXAM  PHYSICAL EXAM General:  Alert,sitting up in chair, NAD Psych:  Mood and affect appropriate for situation, calm and cooperative.  CV: Regular rate and rhythm on monitor Respiratory:  Regular, unlabored respirations on room air GI: Abdomen soft and  nontender  NEURO:  Mental Status: Awake alert and oriented.  She is able to give clear and comprehensive history Speech/Language: No dysarthria or aphasia present. Naming and repetition intact.    Cranial Nerves:  II: PERRL.  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 Eyelids elevate symmetrically. Fully tracks examiner.  V: Facial sensation intact and symmetrical VII: Face is symmetrical resting and smiling VIII: hearing intact to voice. IX, X: Palate elevates symmetrically. Phonation is normal.  WU:[id number] shrug 5/5. XII: Midline tongue protrusion Motor: 5/5 strength to all muscle groups tested.  No drift present.  No tremors present. Tone: is normal and bulk is normal Sensation- Intact to light touch bilaterally. Extinction absent to light touch to DSS.   Coordination: FTN/HKS intact bilaterally.  No ataxia Gait- deferred  1a Level of Conscious.: 0 1b LOC Questions: 0 1c LOC Commands: 0 2 Best Gaze: 0 3 Visual: 1 4 Facial Palsy: 0 5a Motor Arm - left: 0 5b Motor Arm - Right: 0 6a Motor Leg - Left: 0 6b Motor Leg - Right: 0 7 Limb Ataxia: 0 8 Sensory: 0 9 Best Language: 0 10 Dysarthria: 0 11 Extinct. and Inatten.: 0 TOTAL: 1   Discharge Diet       Diet   Diet Heart Fluid consistency: Thin   liquids  DISCHARGE PLAN Disposition: Home clopidogrel 75 mg daily for secondary stroke prevention Ongoing stroke risk factor control by Primary Care Physician at time of discharge Follow-up PCP [name], MD in 2 weeks. Follow up with Vascular Surgery Follow-up at [facility] Ms Prentiss Bells in 4  weeks, office to schedule an appointment.    35 minutes were spent preparing discharge.   Pt seen by Neuro NP/APP and later by MD. Note/plan to be edited by MD as needed.    Lynnae January, DNP, AGACNP-BC [facility] Please use AMION for contact information & EPIC for messaging.  ATTENDING NOTE: I reviewed above note and agree with the assessment  and plan. Pt was seen and examined.   No acute event overnight.  Patient neuro stable, still has left upper quadrantanopsia otherwise neuro intact.  Repeat CT stable hemorrhagic transformation.   Will start Plavix only.  Status post IVC filter.  Medically ready for discharge.  Follow-up at [facility] with Ms. Wertman in 4 weeks.  For detailed assessment and plan, please refer to above/below as I have made changes wherever appropriate.   Marvel Plan, MD PhD Stroke Neurology 07/28/2023 4:52 PM

## 2023-07-29 ENCOUNTER — Telehealth: Payer: Self-pay | Admitting: *Deleted

## 2023-07-29 NOTE — Transitions of Care (Post Inpatient/ED Visit) (Signed)
   07/29/2023  Name: TIERANY APPLEBY MRN: [id number] DOB: 1956-05-04  Today's TOC FU Call Status: Today's TOC FU Call Status:: Unsuccessful Call (1st Attempt) Unsuccessful Call (1st Attempt) Date: 07/29/23  Attempted to reach the patient regarding the most recent Inpatient/ED visit.  Follow Up Plan: Additional outreach attempts will be made to reach the patient to complete the Transitions of Care (Post Inpatient/ED visit) call.   Irving Shows [facility], BSN RN Care Manager/ Transition of Care [place]/ [facility] [phone]

## 2023-07-30 ENCOUNTER — Emergency Department (HOSPITAL_COMMUNITY)

## 2023-07-30 ENCOUNTER — Encounter (HOSPITAL_COMMUNITY): Payer: Self-pay

## 2023-07-30 ENCOUNTER — Emergency Department (HOSPITAL_COMMUNITY)
Admission: EM | Admit: 2023-07-30 | Discharge: 2023-07-30 | Disposition: A | Discharge status: Home / Self Care | Attending: Emergency Medicine | Admitting: Emergency Medicine

## 2023-07-30 DIAGNOSIS — R072 Precordial pain: Secondary | ICD-10-CM | POA: Insufficient documentation

## 2023-07-30 DIAGNOSIS — R519 Headache, unspecified: Secondary | ICD-10-CM | POA: Diagnosis not present

## 2023-07-30 DIAGNOSIS — J439 Emphysema, unspecified: Secondary | ICD-10-CM | POA: Diagnosis not present

## 2023-07-30 DIAGNOSIS — Q600 Renal agenesis, unilateral: Secondary | ICD-10-CM | POA: Diagnosis not present

## 2023-07-30 DIAGNOSIS — I10 Essential (primary) hypertension: Secondary | ICD-10-CM | POA: Diagnosis not present

## 2023-07-30 DIAGNOSIS — Q630 Accessory kidney: Secondary | ICD-10-CM | POA: Diagnosis not present

## 2023-07-30 DIAGNOSIS — K5730 Diverticulosis of large intestine without perforation or abscess without bleeding: Secondary | ICD-10-CM | POA: Diagnosis not present

## 2023-07-30 DIAGNOSIS — I639 Cerebral infarction, unspecified: Secondary | ICD-10-CM | POA: Diagnosis not present

## 2023-07-30 DIAGNOSIS — Z8673 Personal history of transient ischemic attack (TIA), and cerebral infarction without residual deficits: Secondary | ICD-10-CM | POA: Insufficient documentation

## 2023-07-30 LAB — CBC WITH DIFFERENTIAL/PLATELET
Abs Immature Granulocytes: 0.01 10*3/uL (ref 0.00–0.07)
Basophils Absolute: 0 10*3/uL (ref 0.0–0.1)
Basophils Relative: 0 %
Eosinophils Absolute: 0.2 10*3/uL (ref 0.0–0.5)
Eosinophils Relative: 3 %
HCT: 32.7 % — ABNORMAL LOW (ref 36.0–46.0)
Hemoglobin: 10.9 g/dL — ABNORMAL LOW (ref 12.0–15.0)
Immature Granulocytes: 0 %
Lymphocytes Relative: 33 %
Lymphs Abs: 2.4 10*3/uL (ref 0.7–4.0)
MCH: 30.7 pg (ref 26.0–34.0)
MCHC: 33.3 g/dL (ref 30.0–36.0)
MCV: 92.1 fL (ref 80.0–100.0)
Monocytes Absolute: 0.5 10*3/uL (ref 0.1–1.0)
Monocytes Relative: 7 %
Neutro Abs: 4.2 10*3/uL (ref 1.7–7.7)
Neutrophils Relative %: 57 %
Platelets: 345 10*3/uL (ref 150–400)
RBC: 3.55 MIL/uL — ABNORMAL LOW (ref 3.87–5.11)
RDW: 12.7 % (ref 11.5–15.5)
WBC: 7.4 10*3/uL (ref 4.0–10.5)
nRBC: 0 % (ref 0.0–0.2)

## 2023-07-30 LAB — BASIC METABOLIC PANEL
Anion gap: 10 (ref 5–15)
BUN: 8 mg/dL (ref 8–23)
CO2: 22 mmol/L (ref 22–32)
Calcium: 8.9 mg/dL (ref 8.9–10.3)
Chloride: 107 mmol/L (ref 98–111)
Creatinine, Ser: 0.77 mg/dL (ref 0.44–1.00)
GFR, Estimated: >60 mL/min (ref >60)
Glucose, Bld: 81 mg/dL (ref 70–99)
Potassium: 3.8 mmol/L (ref 3.5–5.1)
Sodium: 139 mmol/L (ref 135–145)

## 2023-07-30 LAB — TROPONIN I (HIGH SENSITIVITY)
Troponin I (High Sensitivity): 8 ng/L (ref <18)
Troponin I (High Sensitivity): 12 ng/L (ref <18)

## 2023-07-30 MED ORDER — ACETAMINOPHEN 500 MG PO TABS
1000.0000 mg | ORAL_TABLET | Freq: Once | ORAL | Status: AC
  Administered 2023-07-30: 1000 mg via ORAL
  Filled 2023-07-30: qty 2

## 2023-07-30 MED ORDER — CARVEDILOL 3.125 MG PO TABS: 6.2500 mg | ORAL_TABLET | Freq: Two times a day (BID) | ORAL | 0 refills | Status: DC

## 2023-07-30 MED ORDER — ONDANSETRON HCL 4 MG/2ML IJ SOLN
4.0000 mg | Freq: Once | INTRAMUSCULAR | Status: DC
  Filled 2023-07-30: qty 2

## 2023-07-30 MED ORDER — IOHEXOL 350 MG/ML SOLN
75.0000 mL | Freq: Once | INTRAVENOUS | Status: AC | PRN
  Administered 2023-07-30: 75 mL via INTRAVENOUS

## 2023-07-30 MED ORDER — MORPHINE SULFATE (PF) 4 MG/ML IV SOLN
4.0000 mg | Freq: Once | INTRAVENOUS | Status: AC
  Administered 2023-07-30: 4 mg via INTRAVENOUS
  Filled 2023-07-30: qty 1

## 2023-07-30 MED ORDER — LABETALOL HCL 5 MG/ML IV SOLN
10.0000 mg | Freq: Once | INTRAVENOUS | Status: AC
  Administered 2023-07-30: 10 mg via INTRAVENOUS
  Filled 2023-07-30: qty 4

## 2023-07-30 NOTE — ED Triage Notes (Addendum)
 Pt presents d/t hypertension.  Pt reports reading as high has systolic 200s at home.  Pt had a filter placed in femoral artery x4 days ago.    Pt reports taking all medications as prescribed. Sts several of her medications were changed on 3/3.

## 2023-07-30 NOTE — ED Provider Notes (Signed)
 Care of patient received from prior provider at 9:32 PM, please see their note for complete H/P and care plan.  Received handoff per ED course.  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 Got IVC filter Having chest pain. CTA pending [CC]    Clinical Course User Index [CC] [name], MD    Reassessment: Symptoms resolved. Studies per Dr. Jacqulyn Bath all never. BP back at her baseline after labetalol. Will double carvidol until seen by PCP in the clinic.  Long conversation with patient and daughter.  They are in agreement with plan.  Disposition:  I have considered need for hospitalization, however, considering all of the above, I believe this patient is stable for discharge at this time.  Patient/family educated about specific return precautions for given chief complaint and symptoms.  Patient/family educated about follow-up with PCP.     Patient/family expressed understanding of return precautions and need for follow-up. Patient spoken to regarding all imaging and laboratory results and appropriate follow up for these results. All education provided in verbal form with additional information in written form. Time was allowed for answering of patient questions. Patient discharged.    Emergency Department Medication Summary:   Medications  ondansetron [facility]) injection 4 mg (4 mg Intravenous Patient Refused/Not Given 07/30/23 1929)  labetalol (NORMODYNE) injection 10 mg (10 mg Intravenous Given 07/30/23 1734)  acetaminophen (TYLENOL) tablet 1,000 mg (1,000 mg Oral Given 07/30/23 1736)  morphine (PF) 4 MG/ML injection 4 mg (4 mg Intravenous Given 07/30/23 1930)  iohexol (OMNIPAQUE) 350 MG/ML injection 75 mL (75 mLs Intravenous Contrast Given 07/30/23 2009)           [name], MD 07/30/23 2132

## 2023-07-30 NOTE — ED Notes (Signed)
 Pt walked to restroom after being taken to treatment room in a wheelchair.

## 2023-07-30 NOTE — ED Notes (Signed)
 Pt sitting upright, AOX4, GCS 15.   Pt resting comfortably with no complaints.   Pt provided water, snacks.

## 2023-07-30 NOTE — Discharge Instructions (Addendum)
 Please take your blood pressure medication as we discussed with the amlodipine midday and double your Coreg morning and evening until seen by PCP.  Please call your primary care doctor first thing Monday to make them aware of your ED visit and schedule a follow-up to recheck your blood pressure in the office.  If you develop any new or suddenly worsening symptoms including chest pain, severe headache, weakness/numbness please return to the emergency department for reevaluation.

## 2023-07-30 NOTE — ED Provider Notes (Signed)
 Emergency Department Provider Note   I have reviewed the triage vital signs and the nursing notes.   HISTORY  Chief Complaint Hypertension and Post-op Problem   HPI [name] is a 68 y.o. female with past history reviewed below including hypertension, hyperlipidemia, stroke with recent hemorrhagic conversion and IVC filter placement presents to the emergency department with high blood pressures at home.  She has had a new, frontal headache but no numbness/weakness.  She also felt some slight tightness in her chest which has resolved earlier when her blood pressure was reading high at home.  She called her PCP office and given her recent hospitalization for hemorrhagic stroke was advised to present to the ED.  She is compliant with her home medications including Coreg and amlodipine.  She typically takes her amlodipine at night but took it this afternoon when her pressures were high.  Past Medical History:  Diagnosis Date   Anxiety    Carotid stenosis, asymptomatic, bilateral    Cataract    Chronic left shoulder pain    Headache    Hiatal hernia    small   Hyperlipidemia    Hypertension    Osteoporosis    Pre-diabetes    Schatzki's ring    Seasonal allergies    Tremor     Review of Systems  Constitutional: No fever/chills Cardiovascular: Positive chest pain. Respiratory: Denies shortness of breath. Gastrointestinal: No abdominal pain.  No nausea, no vomiting.   Skin: Negative for rash. Neurological: Positive frontal HA. No weakness/numbness.  ____________________________________________   PHYSICAL EXAM:  VITAL SIGNS: ED Triage Vitals  Encounter Vitals Group     BP 07/30/23 1624 (!) 183/76     Pulse Rate 07/30/23 1624 99     Resp 07/30/23 1624 16     Temp 07/30/23 1624 98.3 F (36.8 C)     Temp Source 07/30/23 1711 Oral     SpO2 07/30/23 1624 94 %     Weight 07/30/23 1638 122 lb (55.3 kg)     Height 07/30/23 1638 5\' 7"  (1.702 m)   Constitutional:  Alert and oriented. Well appearing and in no acute distress. Eyes: Conjunctivae are normal. PERRL. Head: Atraumatic. Nose: No congestion/rhinnorhea. Mouth/Throat: Mucous membranes are moist.   Neck: No stridor.   Cardiovascular: Normal rate, regular rhythm. Good peripheral circulation. Grossly normal heart sounds.   Respiratory: Normal respiratory effort.  No retractions. Lungs CTAB. Gastrointestinal: Soft and nontender. No distention.  Musculoskeletal: No lower extremity tenderness nor edema. No gross deformities of extremities. Neurologic:  Normal speech and language. No gross focal neurologic deficits are appreciated.  Skin:  Skin is warm, dry and intact. No rash noted.  Venipuncture site in the lower right groin visualized without bleeding or hematoma/swelling.   ____________________________________________   LABS (all labs ordered are listed, but only abnormal results are displayed)  Labs Reviewed  CBC WITH DIFFERENTIAL/PLATELET - Abnormal; Notable for the following components:      Result Value   RBC 3.55 (*)    Hemoglobin 10.9 (*)    HCT 32.7 (*)    All other components within normal limits  BASIC METABOLIC PANEL  TROPONIN I (HIGH SENSITIVITY)  TROPONIN I (HIGH SENSITIVITY)   ____________________________________________  EKG   EKG Interpretation Date/Time:  Saturday July 30 2023 18:37:47 EDT Ventricular Rate:  78 PR Interval:  160 QRS Duration:  81 QT Interval:  410 QTC Calculation: 467 R Axis:   50  Text Interpretation: Sinus rhythm Confirmed by Alona Bene (  [id number]) on 07/30/2023 6:50:39 PM        ____________________________________________  RADIOLOGY  No results found.  ____________________________________________   PROCEDURES  Procedure(s) performed:   Procedures   ____________________________________________   INITIAL IMPRESSION / ASSESSMENT AND PLAN / ED COURSE  Pertinent labs & imaging results that were available during my care of the  patient were reviewed by me and considered in my medical decision making (see chart for details).   This patient is Presenting for Evaluation of HA/CP, which does require a range of treatment options, and is a complaint that involves a high risk of morbidity and mortality.  The Differential Diagnoses includes but is not exclusive to acute coronary syndrome, aortic dissection, pulmonary embolism, cardiac tamponade, community-acquired pneumonia, pericarditis, musculoskeletal chest wall pain, etc.   Critical Interventions-    Medications  ondansetron (ZOFRAN) injection 4 mg (4 mg Intravenous Patient Refused/Not Given 07/30/23 1929)  labetalol (NORMODYNE) injection 10 mg (10 mg Intravenous Given 07/30/23 1734)  acetaminophen (TYLENOL) tablet 1,000 mg (1,000 mg Oral Given 07/30/23 1736)  morphine (PF) 4 MG/ML injection 4 mg (4 mg Intravenous Given 07/30/23 1930)    Reassessment after intervention: patient with HA improved.   I decided to review pertinent External Data, and in summary reviewed recent admit note and vascular procedure note.   Clinical Laboratory Tests Ordered, included troponin normal.  No acute kidney injury.  CBC without leukocytosis.  No severe anemia.  Radiologic Tests Ordered, included CTA dissection and CT head. I independently interpreted the images and agree with radiology interpretation.   Cardiac Monitor Tracing which shows NSR.    Social Determinants of Health Risk patient is a non-smoker.   Consult complete with  Medical Decision Making: Summary:  Patient presents to the emergency department with headache and elevated blood pressure.  No focal neurodeficits on my evaluation.  Headache was fairly gradual in onset but atypical compared to her normal's.  Did have recent hemorrhagic conversion of stroke and is not anticoagulated in the setting.  IVC filter placed but venipuncture site is very well-appearing.  Plan for CT head and reassess along with labs to screen for  hypertensive emergency.  Reevaluation with update and discussion with patient.  Called to bedside with her having sudden worsening of her chest discomfort.  She describes central chest discomfort radiating to the back.  No ripping/tearing/sharp discomfort but still a significant increase.  Repeat EKG reassuring.  Plan for screening/second troponin and have added a dissection CTA to her workup here.   ***Considered admission***  Patient's presentation is most consistent with acute presentation with potential threat to life or bodily function.   Disposition:   ____________________________________________  FINAL CLINICAL IMPRESSION(S) / ED DIAGNOSES  Final diagnoses:  None     NEW OUTPATIENT MEDICATIONS STARTED DURING THIS VISIT:  New Prescriptions   No medications on file    Note:  This document was prepared using Dragon voice recognition software and may include unintentional dictation errors.  Alona Bene, MD, [facility]

## 2023-07-30 NOTE — ED Notes (Addendum)
 Pt adv. Her chest pain has subsided but is still complaining of 4/10 headache and 5/10 back pain in the center described as pressure. Pt refused nausea medication.

## 2023-07-30 NOTE — ED Notes (Addendum)
 Pt reported to this paramedic that she began having acute onset of chest pain since she has been in her room.   Pt describes pain as 10/10 and sharp in the center of her chest, radiating to her back.   Repeat EKG captured immediately.   Provider notified face to face and is at bedside.   Repeat V/S obtained.   GCS 15  AOx4.   New orders placed.

## 2023-07-31 LAB — CBG MONITORING, ED: Glucose-Capillary: 76 mg/dL (ref 70–99)

## 2023-08-01 ENCOUNTER — Encounter (HOSPITAL_COMMUNITY): Payer: Self-pay

## 2023-08-01 ENCOUNTER — Emergency Department (HOSPITAL_COMMUNITY)

## 2023-08-01 ENCOUNTER — Emergency Department (HOSPITAL_COMMUNITY)
Admission: EM | Admit: 2023-08-01 | Discharge: 2023-08-01 | Disposition: A | Discharge status: Home / Self Care | Attending: Emergency Medicine | Admitting: Emergency Medicine

## 2023-08-01 ENCOUNTER — Telehealth: Payer: Self-pay

## 2023-08-01 ENCOUNTER — Other Ambulatory Visit: Payer: Self-pay

## 2023-08-01 ENCOUNTER — Telehealth: Payer: Self-pay | Admitting: *Deleted

## 2023-08-01 ENCOUNTER — Ambulatory Visit: Payer: Self-pay

## 2023-08-01 DIAGNOSIS — Z91040 Latex allergy status: Secondary | ICD-10-CM | POA: Diagnosis not present

## 2023-08-01 DIAGNOSIS — I6381 Other cerebral infarction due to occlusion or stenosis of small artery: Secondary | ICD-10-CM | POA: Diagnosis not present

## 2023-08-01 DIAGNOSIS — Z79899 Other long term (current) drug therapy: Secondary | ICD-10-CM | POA: Diagnosis not present

## 2023-08-01 DIAGNOSIS — Z7902 Long term (current) use of antithrombotics/antiplatelets: Secondary | ICD-10-CM | POA: Insufficient documentation

## 2023-08-01 DIAGNOSIS — Z8673 Personal history of transient ischemic attack (TIA), and cerebral infarction without residual deficits: Secondary | ICD-10-CM | POA: Diagnosis not present

## 2023-08-01 DIAGNOSIS — R200 Anesthesia of skin: Secondary | ICD-10-CM | POA: Diagnosis not present

## 2023-08-01 DIAGNOSIS — I6501 Occlusion and stenosis of right vertebral artery: Secondary | ICD-10-CM | POA: Diagnosis not present

## 2023-08-01 DIAGNOSIS — I70221 Atherosclerosis of native arteries of extremities with rest pain, right leg: Secondary | ICD-10-CM

## 2023-08-01 DIAGNOSIS — R791 Abnormal coagulation profile: Secondary | ICD-10-CM | POA: Diagnosis not present

## 2023-08-01 DIAGNOSIS — G936 Cerebral edema: Secondary | ICD-10-CM | POA: Diagnosis not present

## 2023-08-01 DIAGNOSIS — I672 Cerebral atherosclerosis: Secondary | ICD-10-CM | POA: Diagnosis not present

## 2023-08-01 DIAGNOSIS — I10 Essential (primary) hypertension: Secondary | ICD-10-CM | POA: Insufficient documentation

## 2023-08-01 DIAGNOSIS — R202 Paresthesia of skin: Secondary | ICD-10-CM

## 2023-08-01 DIAGNOSIS — I6523 Occlusion and stenosis of bilateral carotid arteries: Secondary | ICD-10-CM | POA: Diagnosis not present

## 2023-08-01 DIAGNOSIS — R29818 Other symptoms and signs involving the nervous system: Secondary | ICD-10-CM | POA: Diagnosis not present

## 2023-08-01 LAB — CBC
HCT: 38.7 % (ref 36.0–46.0)
Hemoglobin: 12.5 g/dL (ref 12.0–15.0)
MCH: 29.8 pg (ref 26.0–34.0)
MCHC: 32.3 g/dL (ref 30.0–36.0)
MCV: 92.4 fL (ref 80.0–100.0)
Platelets: 381 10*3/uL (ref 150–400)
RBC: 4.19 MIL/uL (ref 3.87–5.11)
RDW: 12.9 % (ref 11.5–15.5)
WBC: 8.2 10*3/uL (ref 4.0–10.5)
nRBC: 0 % (ref 0.0–0.2)

## 2023-08-01 LAB — ETHANOL: Alcohol, Ethyl (B): <10 mg/dL (ref <10)

## 2023-08-01 LAB — I-STAT CHEM 8, ED
BUN: 5 mg/dL — ABNORMAL LOW (ref 8–23)
Calcium, Ion: 1.15 mmol/L (ref 1.15–1.40)
Chloride: 105 mmol/L (ref 98–111)
Creatinine, Ser: 0.8 mg/dL (ref 0.44–1.00)
Glucose, Bld: 104 mg/dL — ABNORMAL HIGH (ref 70–99)
HCT: 39 % (ref 36.0–46.0)
Hemoglobin: 13.3 g/dL (ref 12.0–15.0)
Potassium: 4 mmol/L (ref 3.5–5.1)
Sodium: 139 mmol/L (ref 135–145)
TCO2: 24 mmol/L (ref 22–32)

## 2023-08-01 LAB — DIFFERENTIAL
Abs Immature Granulocytes: 0.03 10*3/uL (ref 0.00–0.07)
Basophils Absolute: 0 10*3/uL (ref 0.0–0.1)
Basophils Relative: 1 %
Eosinophils Absolute: 0.1 10*3/uL (ref 0.0–0.5)
Eosinophils Relative: 2 %
Immature Granulocytes: 0 %
Lymphocytes Relative: 30 %
Lymphs Abs: 2.5 10*3/uL (ref 0.7–4.0)
Monocytes Absolute: 0.5 10*3/uL (ref 0.1–1.0)
Monocytes Relative: 6 %
Neutro Abs: 5.1 10*3/uL (ref 1.7–7.7)
Neutrophils Relative %: 61 %

## 2023-08-01 LAB — TROPONIN I (HIGH SENSITIVITY)
Troponin I (High Sensitivity): 8 ng/L (ref <18)
Troponin I (High Sensitivity): 12 ng/L (ref <18)

## 2023-08-01 LAB — CBG MONITORING, ED: Glucose-Capillary: 102 mg/dL — ABNORMAL HIGH (ref 70–99)

## 2023-08-01 LAB — COMPREHENSIVE METABOLIC PANEL
ALT: 12 U/L (ref 0–44)
AST: 21 U/L (ref 15–41)
Albumin: 4 g/dL (ref 3.5–5.0)
Alkaline Phosphatase: 52 U/L (ref 38–126)
Anion gap: 10 (ref 5–15)
BUN: 6 mg/dL — ABNORMAL LOW (ref 8–23)
CO2: 22 mmol/L (ref 22–32)
Calcium: 9.1 mg/dL (ref 8.9–10.3)
Chloride: 105 mmol/L (ref 98–111)
Creatinine, Ser: 0.79 mg/dL (ref 0.44–1.00)
GFR, Estimated: >60 mL/min (ref >60)
Glucose, Bld: 106 mg/dL — ABNORMAL HIGH (ref 70–99)
Potassium: 3.9 mmol/L (ref 3.5–5.1)
Sodium: 137 mmol/L (ref 135–145)
Total Bilirubin: 0.7 mg/dL (ref 0.0–1.2)
Total Protein: 7.3 g/dL (ref 6.5–8.1)

## 2023-08-01 LAB — PROTIME-INR
INR: 1 (ref 0.8–1.2)
Prothrombin Time: 13.6 s (ref 11.4–15.2)

## 2023-08-01 LAB — APTT: aPTT: 27 s (ref 24–36)

## 2023-08-01 MED ORDER — GADOBUTROL 1 MMOL/ML IV SOLN
6.0000 mL | Freq: Once | INTRAVENOUS | Status: AC | PRN
  Administered 2023-08-01: 6 mL via INTRAVENOUS

## 2023-08-01 MED ORDER — AMLODIPINE BESYLATE 5 MG PO TABS
10.0000 mg | ORAL_TABLET | Freq: Once | ORAL | Status: AC
  Administered 2023-08-01: 10 mg via ORAL
  Filled 2023-08-01: qty 2

## 2023-08-01 MED ORDER — CARVEDILOL 3.125 MG PO TABS
6.2500 mg | ORAL_TABLET | Freq: Once | ORAL | Status: AC
  Administered 2023-08-01: 6.25 mg via ORAL
  Filled 2023-08-01: qty 2

## 2023-08-01 MED ORDER — SODIUM CHLORIDE 0.9% FLUSH
3.0000 mL | Freq: Once | INTRAVENOUS | Status: AC
  Administered 2023-08-01: 3 mL via INTRAVENOUS

## 2023-08-01 MED ORDER — IOHEXOL 350 MG/ML SOLN
75.0000 mL | Freq: Once | INTRAVENOUS | Status: AC | PRN
  Administered 2023-08-01: 75 mL via INTRAVENOUS

## 2023-08-01 MED ORDER — LORAZEPAM 2 MG/ML IJ SOLN
0.5000 mg | Freq: Once | INTRAMUSCULAR | Status: AC
  Administered 2023-08-01: 0.5 mg via INTRAVENOUS
  Filled 2023-08-01: qty 1

## 2023-08-01 NOTE — ED Notes (Signed)
Patient ambulated independently to bathroom to provide urine sample

## 2023-08-01 NOTE — Transitions of Care (Post Inpatient/ED Visit) (Signed)
   08/01/2023  Name: [name] MRN: [id number] DOB: 07/13/1955  Today's TOC FU Call Status: Today's TOC FU Call Status:: Unsuccessful Call (2nd Attempt) Unsuccessful Call (2nd Attempt) Date: 08/01/23  Attempted to reach the patient regarding the most recent Inpatient/ED visit.  Follow Up Plan: Additional outreach attempts will be made to reach the patient to complete the Transitions of Care (Post Inpatient/ED visit) call.   Irving Shows [facility], BSN RN Care Manager/ Transition of Care [place]/ [facility] [phone]

## 2023-08-01 NOTE — Telephone Encounter (Addendum)
 Copied from CRM [id number]. Topic: Clinical - Red Word Triage >> Aug 01, 2023  8:13 AM Payton Doughty wrote: Red Word that prompted transfer to Nurse Triage: pt went to ED on Friday night.  Pt's bp was 200/98.  Needs fup   Chief Complaint: high BPs Symptoms: intermittent headache 3-4/10 with rise in BP, BP readings 159+/78+ lately Frequency: continual Pertinent Negatives: Patient denies abdominal pain, chest pain, SOB, new weakness or numbness, fatigue, changes in blurred vision Disposition: [] 911 / [] ED /[] Urgent Care (no appt availability in office) / [] Appointment(In office/virtual)/ []  [place] [facility]/ [] Home Care/ [x] Refused Recommended Disposition /[] [facility]/ [x]  Follow-up with PCP Additional Notes: Pt reporting she was recently seen in hospital a couple times for stroke, "a little bleeding in my brain," had stent put in, and "put a filter in my stomach for the blood clots" last week, recently post-op. Pt reporting her BP has been elevated since they took her off of BP meds for procedure, back on meds now with some med changes but having "nagging" headaches that come on "any time BP seems to rise then slight headache" usually in the evening her BP starts to rise. Pt confirms that she had these headaches in the hospital and they told her to go back to hospital if "real bad like one never had before," confirms tylenol helps headache. Pt reporting her blurred vision is "from the stroke" on 3/3 and unchanged. Advised pt be examined in ED due to hx and current symptoms of unmanaged BP, pt refusing and preferring appt with Dr. Tanya Nones, warm transferred to [facility] to discuss options, advised pt go to hospital if any worsening. Pt verbalized understanding.  Reason for Disposition  [1] Systolic BP  >= 160 OR Diastolic >= 100 AND [2] cardiac (e.g., breathing difficulty, chest pain) or neurologic symptoms (e.g., new-onset blurred or double vision, unsteady gait)  Answer Assessment - Initial  Assessment Questions 1. BLOOD PRESSURE: "What is the blood pressure?" "Did you take at least two measurements 5 minutes apart?"     Yesterday 181/90 2. ONSET: "When did you take your blood pressure?"     Taking BP now per nurse request 176/79 with HR 79, then 5 min later 159/78 with HR 71 3. HOW: "How did you take your blood pressure?" (e.g., automatic home BP monitor, visiting nurse)     At home BP cuff 4. HISTORY: "Do you have a history of high blood pressure?"     yes 5. MEDICINES: "Are you taking any medicines for blood pressure?" "Have you missed any doses recently?"     Yes, several, no missed doses 6. OTHER SYMPTOMS: "Do you have any symptoms?" (e.g., blurred vision, chest pain, difficulty breathing, headache, weakness)     Headache 3-4/10, been having headaches all the time but the blurred vision I have is from the stroke 3/3, no new weakness, still have numbness in big toe right foot but nothing new  Protocols used: Blood Pressure - High-A-AH

## 2023-08-01 NOTE — ED Triage Notes (Addendum)
 Pt c/o bp was 203/101 an hour ago at home. Pt states she has been taking her bp meds like she's supposed to. Pt c/o left sided numbness and tingling 0900 today. Pt states woke up at 0700 today. LKW [id number] today. Pt c/omidsternal chest pressure and blurred vision.

## 2023-08-01 NOTE — Plan of Care (Signed)
 Discussed with Dr. Elayne Snare.  Recommended MRI brain without contrast to evaluate new left hand tingling (noting she did also complain of some left hand tingling in an earlier evaluation putting a peripheral etiology on the differential)  She was taken off of Eliquis due to ICH with triple therapy with Eliquis being indicated for DVT, not for atrial fibrillation  Could consider focal seizure but jacksonian march is not described  If MRI brain is reassuring outpatient follow-up seems appropriate  Regarding her hypertensive emergency/urgency certainly her uncontrolled blood pressure does put her at risk of recurrent ICH and I defer management of this to ED / primary team / PCP   These are curbside recommendations based upon the information readily available in the chart on brief review as well as history and examination information provided to me by requesting provider and do not replace a full detailed consult  Brooke Dare MD-PhD [facility] [phone]  8 minutes spent spent in care of the patient, majority in discussion with Dr. Elayne Snare

## 2023-08-01 NOTE — ED Notes (Signed)
Patient transported to CT by RN.

## 2023-08-01 NOTE — ED Notes (Signed)
 Patient transported to MRI

## 2023-08-01 NOTE — ED Notes (Signed)
 MRI called by this RN, MRI to pick up patient.

## 2023-08-01 NOTE — ED Provider Notes (Signed)
 Patient signed out to me at 1530 by Dr. Elayne Snare pending MRI. In short, this is a 68 year old female with past medical history of hypertension, hyperlipidemia and recent ischemic stroke with hemorrhagic conversion that presented to the emergency department with left arm numbness.  Patient was hypertensive on arrival initially complaining of left arm numbness that is since resolved.  Labs and CT imaging are within normal range and blood pressures started to improve with home BP meds.  Neurology recommended MRI and if unchanged plan is for discharge home with outpatient follow-up.  8:19 PM MRI shows subacute CVA with petechial hemorrhage, unclear if this is new or resolving from prior CVA. BP was significantly uncontrolled on arrival, was significant improvement on last check. Will reassess now and discuss with neurology.  9:24 PM I spoke with Dr. Derry Lory with neurology who states MRI findings appear chronic and she is stable for discharge home with outpatient follow up.   Rexford Maus, DO 08/01/23 2149

## 2023-08-01 NOTE — ED Notes (Signed)
 Per EDP, not to activate code stroke

## 2023-08-01 NOTE — Discharge Instructions (Addendum)
 You were seen in the emergency department for numbness of your left hand The CAT scan, blood work and MRI all looked okay It is important that you follow-up with your neurology team and primary care doctor Continue taking all previous prescribed medications Return to the emergency department for numbness weakness on one side, change in speech, severe headache or any other concerns

## 2023-08-01 NOTE — ED Notes (Signed)
 MD at bedside.

## 2023-08-01 NOTE — ED Provider Notes (Signed)
 [place] [facility] AT [facility] Provider Note   CSN: [id number] Arrival date & time: 08/01/23  1246     History  Chief Complaint  Patient presents with   Hypertension   Numbness    [name] is a 68 y.o. female.  With a history of hypertension, hyperlipidemia and recent stroke who presents to the ED given concern for paresthesia.  Recent ischemic stroke with hemorrhagic conversion earlier this month.  Status post IVC filter placement earlier this month as well.  Was feeling fine yesterday but left upper extremity numbness and tingling began at 0800 this morning.  She typically has numbness in both feet following the recent stroke but the numbness in her left hand was new.  Feels as though this is improving but has some tingling in her left thumb still.  No headaches, focal weakness, change in speech.  Noted to be very hypertensive at home with systolic blood pressure over 200.  Reports being compliant with antihypertensive regimen.  She was taken off of anticoagulation following hemorrhagic conversion of stroke earlier this month.   Hypertension       Home Medications Prior to Admission medications   Medication Sig Start Date End Date Taking? Authorizing Provider  acetaminophen (TYLENOL) 500 MG tablet Take 500 mg by mouth every 6 (six) hours as needed for moderate pain or headache.    [provider]  alendronate (FOSAMAX) 70 MG tablet TAKE 1 TABLET(70 MG) BY MOUTH EVERY 7 DAYS WITH A FULL GLASS OF WATER AND ON AN EMPTY STOMACH 07/06/23   [name], MD  amLODipine (NORVASC) 10 MG tablet Take 10 mg by mouth at bedtime.    [provider]  carvedilol (COREG) 3.125 MG tablet Take 2 tablets (6.25 mg total) by mouth 2 (two) times daily with a meal. 07/30/23   [name], MD  Cholecalciferol (VITAMIN D) 50 MCG (2000 UT) CAPS Take 2,000 Units by mouth daily.    [provider]  clopidogrel (PLAVIX) 75 MG tablet Take 1  tablet (75 mg total) by mouth daily. 07/28/23   Lynnae January, NP  Coenzyme Q10 (COQ10) 100 MG CAPS Take 100 mg by mouth daily.    [provider]  CRANBERRY PO Take 1 each by mouth 2 (two) times a week. Chewables    [provider]  Cyanocobalamin (B-12) 2500 MCG TABS Take 2,500 mcg by mouth daily.    [provider]  dexlansoprazole (DEXILANT) 60 MG capsule Take 1 capsule (60 mg total) by mouth daily. 05/18/22   [name], [name] Friends, MD  EPINEPHrine 0.3 mg/0.3 mL IJ SOAJ injection Inject 0.3 mg into the muscle as needed for anaphylaxis. 03/12/22   [name], MD  fluticasone (FLONASE) 50 MCG/ACT nasal spray Place 2 sprays into both nostrils daily as needed for allergies. 01/23/23   [provider]  hydroquinone 4 % cream Apply 1 application topically 2 (two) times daily as needed (dark spots). 01/05/21   [provider]  ipratropium (ATROVENT) 0.03 % nasal spray INSTILL 2 SPRAYS IN EACH NOSTRIL EVERY 12 HOURS Patient taking differently: Place 2 sprays into both nostrils 2 (two) times daily as needed for rhinitis. 04/17/21   [name], MD  loratadine (CLARITIN) 10 MG tablet Take 10 mg by mouth daily as needed for allergies.    [provider]  MAGNESIUM PO Take 330 mg by mouth daily.    [provider]  ondansetron (ZOFRAN) 4 MG tablet TAKE  1 TABLET(4 MG) BY MOUTH EVERY 8 HOURS AS NEEDED FOR NAUSEA OR VOMITING 03/06/21   [name], PA-C  rosuvastatin (CRESTOR) 20 MG tablet Take 1 tablet (20 mg total) by mouth daily. 07/22/23   [name], MD  sodium chloride (OCEAN) 0.65 % SOLN nasal spray Place 1 spray into both nostrils as needed for congestion.    [provider]  triamcinolone cream (KENALOG) 0.1 % Apply 1 Application topically 2 (two) times daily. Patient taking differently: Apply 1 Application topically 2 (two) times daily as needed (irritation). 10/29/21   [name], MD  vitamin E 400 UNIT capsule  Take 400 Units by mouth 3 (three) times a week.    [provider]  zinc gluconate 50 MG tablet Take 50 mg by mouth 3 (three) times a week.    [provider]      Allergies    Ciprofloxacin, Latex, and Vitamin c [bioflavonoid products]    Review of Systems   Review of Systems  Physical Exam Updated Vital Signs BP (!) 183/73   Pulse 70   Temp 98.1 F (36.7 C)   Resp 14   Ht 5\' 7"  (1.702 m)   Wt 55.3 kg   SpO2 97%   BMI 19.09 kg/m  Physical Exam Vitals and nursing note reviewed.  HENT:     Head: Normocephalic and atraumatic.  Eyes:     Pupils: Pupils are equal, round, and reactive to light.  Cardiovascular:     Rate and Rhythm: Normal rate and regular rhythm.  Pulmonary:     Effort: Pulmonary effort is normal.     Breath sounds: Normal breath sounds.  Abdominal:     Palpations: Abdomen is soft.     Tenderness: There is no abdominal tenderness.  Skin:    General: Skin is warm and dry.  Neurological:     General: No focal deficit present.     Mental Status: She is alert.     Comments: Decree sensation to light touch over left thumb 5 out of 5 motor strength bilateral upper and lower extremities No facial droop Clear fluent speech Visual fields grossly intact   Psychiatric:        Mood and Affect: Mood normal.     ED Results / Procedures / Treatments   Labs (all labs ordered are listed, but only abnormal results are displayed) Labs Reviewed  COMPREHENSIVE METABOLIC PANEL - Abnormal; Notable for the following components:      Result Value   Glucose, Bld 106 (*)    BUN 6 (*)    All other components within normal limits  I-STAT CHEM 8, ED - Abnormal; Notable for the following components:   BUN 5 (*)    Glucose, Bld 104 (*)    All other components within normal limits  CBG MONITORING, ED - Abnormal; Notable for the following components:   Glucose-Capillary 102 (*)    All other components within normal limits  PROTIME-INR  APTT  CBC   DIFFERENTIAL  ETHANOL  TROPONIN I (HIGH SENSITIVITY)  TROPONIN I (HIGH SENSITIVITY)    EKG EKG Interpretation Date/Time:  Monday August 01 2023 13:25:28 EDT Ventricular Rate:  73 PR Interval:  164 QRS Duration:  93 QT Interval:  420 QTC Calculation: 463 R Axis:   55  Text Interpretation: Sinus rhythm Nonspecific T abnormalities, lateral leads No significant change since last tracing Confirmed by Elayne Snare ([id number]) on 08/01/2023 4:09:55 PM  Radiology CT ANGIO HEAD NECK  W WO CM Result Date: 08/01/2023 CLINICAL DATA:  Left hand numbness.  Previous PCA territory stroke. EXAM: CT ANGIOGRAPHY HEAD AND NECK WITH AND WITHOUT CONTRAST TECHNIQUE: Multidetector CT imaging of the head and neck was performed using the standard protocol during bolus administration of intravenous contrast. Multiplanar CT image reconstructions and MIPs were obtained to evaluate the vascular anatomy. Carotid stenosis measurements (when applicable) are obtained utilizing NASCET criteria, using the distal internal carotid diameter as the denominator. RADIATION DOSE REDUCTION: This exam was performed according to the departmental dose-optimization program which includes automated exposure control, adjustment of the mA and/or kV according to patient size and/or use of iterative reconstruction technique. CONTRAST:  75mL OMNIPAQUE IOHEXOL 350 MG/ML SOLN COMPARISON:  None Available. FINDINGS: CTA NECK FINDINGS Aortic arch: Aortic atherosclerosis. Branching pattern is normal without flow limiting origin stenosis. Right carotid system: Common carotid artery is widely patent to the bifurcation. Calcified plaque at the carotid bifurcation and ICA bulb. Minimal diameter of the proximal ICA is 1.7 mm. Compared to a more distal cervical ICA diameter of 3.5 mm, this indicates a 50% stenosis. Left carotid system: Common carotid artery is widely patent to the bifurcation. Calcified plaque at the carotid bifurcation and ICA bulb. No  stenosis. Vertebral arteries: Both vertebral artery origins are patent. No stenosis on the left. Calcified plaque at the right vertebral artery origin with 30-50% stenosis. Beyond that, both vessels are widely patent through the cervical region to the foramen magnum. Skeleton: Ordinary cervical spondylosis. Other neck: No mass or lymphadenopathy. Upper chest: Lung apices are clear. Review of the MIP images confirms the above findings CTA HEAD FINDINGS Anterior circulation: Both internal carotid arteries are patent through the skull base and siphon regions. There is siphon atherosclerotic calcification with stenosis estimated at 30-50% on both sides. Beyond that, the anterior and middle cerebral vessels are patent. No large vessel occlusion or proximal stenosis. No aneurysm or vascular malformation. Posterior circulation: Both vertebral arteries are patent through the foramen magnum to the basilar artery. Focal plaque at the right vertebral artery V4 segment with stenosis of 30%. No basilar artery stenosis. Superior cerebellar and posterior cerebral arteries are patent. Posterior communicating artery patent on the right presently, the PCA vessels on both sides show flow. Venous sinuses: Patent and normal. Anatomic variants: None significant. Review of the MIP images confirms the above findings IMPRESSION: 1. No intracranial large vessel occlusion or correctable proximal stenosis. This includes flow in the right PCA territory. 2. Aortic atherosclerosis. 3. Atherosclerotic disease at both carotid bifurcations. 50% stenosis of the proximal right ICA. No stenosis on the left. 4. 30-50% stenosis at the right vertebral artery origin. 5. Atherosclerotic disease at both carotid siphon regions with stenosis estimated at 30-50% on both sides. 6. Focal plaque at the right vertebral artery V4 segment with stenosis of 30%. Aortic Atherosclerosis (ICD10-I70.0). Electronically Signed   By: Paulina Fusi M.D.   On: 08/01/2023 14:06    CT HEAD WO CONTRAST Result Date: 08/01/2023 CLINICAL DATA:  Neuro deficit, acute, stroke suspected. Left hand numbness. EXAM: CT HEAD WITHOUT CONTRAST TECHNIQUE: Contiguous axial images were obtained from the base of the skull through the vertex without intravenous contrast. RADIATION DOSE REDUCTION: This exam was performed according to the departmental dose-optimization program which includes automated exposure control, adjustment of the mA and/or kV according to patient size and/or use of iterative reconstruction technique. COMPARISON:  07/30/2023 FINDINGS: Brain: No abnormality seen affecting the brainstem or cerebellum. Subacute infarction in the right PCA territory affecting  the medial occipital lobe appears similar, with petechial blood products but no frank hematoma. No increase in swelling or mass effect. No evidence of extension of the stroke. Brain appears otherwise normal. No other insult is visible. No hydrocephalus. No extra-axial collection. Vascular: There is atherosclerotic calcification of the major vessels at the base of the brain. Skull: Negative Sinuses/Orbits: Clear/normal Other: None IMPRESSION: Subacute infarction in the right PCA territory affecting the medial occipital lobe appears similar, with petechial blood products but no frank hematoma. No increase in swelling or mass effect. No evidence of extension of the stroke. Electronically Signed   By: Paulina Fusi M.D.   On: 08/01/2023 14:01   CT Angio Chest/Abd/Pel for Dissection W and/or Wo Contrast Result Date: 07/30/2023 CLINICAL DATA:  Sudden severe headache acute aortic syndrome suspected EXAM: CT ANGIOGRAPHY CHEST, ABDOMEN AND PELVIS TECHNIQUE: Non-contrast CT of the chest was initially obtained. Multidetector CT imaging through the chest, abdomen and pelvis was performed using the standard protocol during bolus administration of intravenous contrast. Multiplanar reconstructed images and MIPs were obtained and reviewed to  evaluate the vascular anatomy. RADIATION DOSE REDUCTION: This exam was performed according to the departmental dose-optimization program which includes automated exposure control, adjustment of the mA and/or kV according to patient size and/or use of iterative reconstruction technique. CONTRAST:  75mL OMNIPAQUE IOHEXOL 350 MG/ML SOLN COMPARISON:  CT chest angiogram, 05/02/2023 FINDINGS: CTA CHEST FINDINGS VASCULAR Aorta: Satisfactory opacification of the aorta. Normal contour and caliber of the thoracic aorta. No evidence of aneurysm, dissection, or other acute aortic pathology. Severe, irregular aortic atherosclerosis. Cardiovascular: No evidence of pulmonary embolism on limited non-tailored examination. Normal heart size. No pericardial effusion. Review of the MIP images confirms the above findings. NON VASCULAR Mediastinum/Nodes: No enlarged mediastinal, hilar, or axillary lymph nodes. Thyroid gland, trachea, and esophagus demonstrate no significant findings. Lungs/Pleura: Mild emphysema. Diffuse bilateral bronchial wall thickening. No pleural effusion or pneumothorax. Musculoskeletal: No chest wall abnormality. No acute osseous findings. Review of the MIP images confirms the above findings. CTA ABDOMEN AND PELVIS FINDINGS VASCULAR Normal contour and caliber of the abdominal aorta. No evidence of aneurysm, dissection, or other acute aortic pathology. Duplicated left renal arteries. Solitary right renal artery with otherwise standard branching pattern of the abdominal aorta. Severe aortic atherosclerosis. Patent appearing right common iliac artery stent and patent appearing left common and external iliac artery stent, which excludes the origin of the left internal iliac artery evidence of prior bilateral common femoral artery access (series 6, image 266). Infrarenal IVC filter. Review of the MIP images confirms the above findings. NON-VASCULAR Hepatobiliary: No focal liver abnormality is seen. Status post  cholecystectomy. No biliary dilatation. Pancreas: Unremarkable. No pancreatic ductal dilatation or surrounding inflammatory changes. Spleen: Normal in size without significant abnormality. Adrenals/Urinary Tract: Definitively benign adenomatous thickening of the adrenal glands. Simple, benign right renal cortical cysts, for which no further follow-up or characterization is required. Kidneys are otherwise normal, without renal calculi, solid lesion, or hydronephrosis. Bladder is unremarkable. Stomach/Bowel: Stomach is within normal limits. Appendix appears normal. No evidence of bowel wall thickening, distention, or inflammatory changes. Sigmoid diverticulosis. Lymphatic: No enlarged abdominal or pelvic lymph nodes. Reproductive: No mass or other significant abnormality. Other: No abdominal wall hernia or abnormality. No ascites. Musculoskeletal: No acute osseous findings. IMPRESSION: 1. Normal contour and caliber of the thoracic and abdominal aorta. No evidence of aneurysm, dissection, or other acute aortic pathology. Severe, irregular aortic atherosclerosis. 2. Patent appearing right common iliac artery stent and patent appearing  left common and external iliac artery stent, which excludes the origin of the left internal iliac artery. Evidence of prior bilateral common femoral artery access. 3. Infrarenal IVC filter. 4. Emphysema and diffuse bilateral bronchial wall thickening. 5. Sigmoid diverticulosis without evidence of acute diverticulitis. Aortic Atherosclerosis (ICD10-I70.0) and Emphysema (ICD10-J43.9). Electronically Signed   By: Jearld Lesch M.D.   On: 07/30/2023 20:59   CT Head Wo Contrast Result Date: 07/30/2023 CLINICAL DATA:  Sudden onset headaches, known right occipital hemorrhagic infarct initial encounter EXAM: CT HEAD WITHOUT CONTRAST TECHNIQUE: Contiguous axial images were obtained from the base of the skull through the vertex without intravenous contrast. RADIATION DOSE REDUCTION: This exam was  performed according to the departmental dose-optimization program which includes automated exposure control, adjustment of the mA and/or kV according to patient size and/or use of iterative reconstruction technique. COMPARISON:  07/28/2023 FINDINGS: Brain: There is again noted a hemorrhagic infarct in the right occipital lobe. The overall appearance is similar to that noted on the previous exam. No subarachnoid or intraventricular hemorrhage is noted. No other focal area of ischemia is seen. Empty sella is again identified. Vascular: No hyperdense vessel or unexpected calcification. Skull: Normal. Negative for fracture or focal lesion. Sinuses/Orbits: No acute finding. Other: None. IMPRESSION: Stable appearance of right occipital hemorrhagic infarct. No new focal abnormality is noted. Electronically Signed   By: Alcide Clever M.D.   On: 07/30/2023 20:08    Procedures Procedures    Medications Ordered in ED Medications  sodium chloride flush (NS) 0.9 % injection 3 mL (3 mLs Intravenous Given 08/01/23 1321)  amLODipine (NORVASC) tablet 10 mg (10 mg Oral Given 08/01/23 1348)  iohexol (OMNIPAQUE) 350 MG/ML injection 75 mL (75 mLs Intravenous Contrast Given 08/01/23 1354)  LORazepam (ATIVAN) injection 0.5 mg (0.5 mg Intravenous Given 08/01/23 1523)    ED Course/ Medical Decision Making/ A&P Clinical Course as of 08/01/23 1632  Mon Aug 01, 2023  1500 No acute findings on CTA head and neck or CT Noncon.  Discussed with neurology team on-call.  They recommend MRI brain with and without contrast to evaluate for any acute lesions.  If there are no acute findings on MRI patient can be discharged with outpatient neurology follow-up so long as she remains stable.  Blood pressure is downtrending after amlodipine.  Initial systolic in the 210s now down to 183.  Will continue to monitor blood pressure to ascertain need for tighter control.   [MP]  1615 I, Estelle June DO, am transitioning care of this patient to  the oncoming provider pending MRI, reevaluation and disposition [MP]    Clinical Course User Index [MP] Royanne Foots, DO                                 Medical Decision Making 68 year old female with history as above presenting given concern for new onset paresthesias in left hand.  Onset began around 0800 this morning.  No other new symptoms to speak of.  Significantly hypertensive.  Afebrile.  No focal neurologic deficits on my exam but she does report some decreased sensation in her left thumb compared to the right side.  Outside of stroke window.  No need to activate code stroke at this time but will obtain stroke workup including CT and CTA of the head and neck, consult neuro and continue to monitor here.  Amount and/or Complexity of Data Reviewed Labs: ordered. Radiology: ordered.  Risk  Prescription drug management.           Final Clinical Impression(s) / ED Diagnoses Final diagnoses:  Paresthesia  History of stroke    Rx / DC Orders ED Discharge Orders     None         Royanne Foots, DO 08/01/23 1632

## 2023-08-01 NOTE — Patient Outreach (Signed)
 [facility] Stroke Care Coordination Follow Up  08/01/2023 Name:  [name] MRN:  [id number] DOB:  1955-11-16  Subjective: [name] is a 68 y.o. year old female who is a primary care patient of [name], MD An [facility] alert was received indicating patient responded to questions: Scheduled a follow-up appointment? Went to follow-up appointment?. I reached out by phone to follow up on the alert, unable to reach patient.  Care Coordination Interventions:  No, not indicated   Follow up plan: will outreach on 08/02/23  Encounter Outcome:  No Answer   Irving Shows [facility], BSN RN Care Manager/ Transition of Care [place]/ [facility] [phone]

## 2023-08-02 ENCOUNTER — Encounter: Payer: Self-pay | Admitting: Family Medicine

## 2023-08-02 ENCOUNTER — Telehealth: Payer: Self-pay | Admitting: *Deleted

## 2023-08-02 ENCOUNTER — Ambulatory Visit (INDEPENDENT_AMBULATORY_CARE_PROVIDER_SITE_OTHER): Admitting: Family Medicine

## 2023-08-02 VITALS — BP 162/76 | HR 80 | Temp 98.3°F | Ht 67.0 in | Wt 161.8 lb

## 2023-08-02 DIAGNOSIS — I10 Essential (primary) hypertension: Secondary | ICD-10-CM

## 2023-08-02 DIAGNOSIS — R202 Paresthesia of skin: Secondary | ICD-10-CM

## 2023-08-02 MED ORDER — CLONIDINE HCL 0.1 MG PO TABS: 0.1000 mg | ORAL_TABLET | Freq: Three times a day (TID) | ORAL | 3 refills | Status: AC | PRN

## 2023-08-02 MED ORDER — IRBESARTAN 150 MG PO TABS: 150.0000 mg | ORAL_TABLET | Freq: Every day | ORAL | 3 refills | Status: DC

## 2023-08-02 NOTE — Progress Notes (Signed)
 Subjective:    Patient ID: [name], female    DOB: 1955/07/27, 68 y.o.   MRN: [id number]  See my last office visit.  After my last visit, the patient returned to the hospital with left-sided numbness.  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  As a result, patient was taken off Eliquis and an IVC filter was due to her DVT in her leg.  They also discontinued aspirin and maintain the patient only on Eliquis for secondary prevention of future strokes.  Patient has had very difficult to control blood pressure since that visit.  Her systolic blood pressure is fluctuating between 160 and 200 despite taking carvedilol and amlodipine. Past Medical History:  Diagnosis Date   Anxiety    Carotid stenosis, asymptomatic, bilateral    Cataract    Chronic left shoulder pain    Headache    Hiatal hernia    small   Hyperlipidemia    Hypertension    Osteoporosis    Pre-diabetes    Schatzki's ring    Seasonal allergies    Tremor      Past Surgical History:  Procedure Laterality Date   ABDOMINAL AORTOGRAM W/LOWER EXTREMITY N/A 07/11/2023   Procedure: ABDOMINAL AORTOGRAM W/LOWER EXTREMITY;  Surgeon: Maeola Harman, MD;  Location: [facility] INVASIVE CV LAB;  Service: Cardiovascular;  Laterality: N/A;   CARDIAC CATHETERIZATION  2005   "[name] has essentially normal coronary arteries and normal left ventricular function.  She will be treated empirically with anti-reflux measures"   CATARACT EXTRACTION, BILATERAL     CHOLECYSTECTOMY     COLONOSCOPY    06/16/2004   WUJ:WJXBJY rectum/Diminutive polyps of the splenic flexure and the cecum/The remainder of the colonic mucosa appeared normal pathology (hyperplastic polyps).   COLONOSCOPY N/A 08/07/2015   Dr. Jena Gauss: 6 mm descending colon tubular adenoma, multiple medium-mouthed diverticula in sigmoid colon. surveillance 2022   COLONOSCOPY N/A 04/15/2021   Procedure: COLONOSCOPY;  Surgeon: Corbin Ade,  MD;  [facility];  Service: Endoscopy;  Laterality: N/A;  9:30am   ESOPHAGOGASTRODUODENOSCOPY  09/11/2001   NWG:NFAOZHYQM ring otherwise normal esophagus/Normal stomach/Normal D1 and D2 status post passage of a 56 French Maloney dilator   ESOPHAGOGASTRODUODENOSCOPY N/A 11/11/2017   Procedure: ESOPHAGOGASTRODUODENOSCOPY (EGD);  Surgeon: Corbin Ade, MD; mild Schatzki ring s/p dilation and mild erosive gastropathy.   ESOPHAGOGASTRODUODENOSCOPY (EGD) WITH ESOPHAGEAL DILATION N/A 01/25/2013   Dr. Jena Gauss- schatzki's ring- 54 F dilation. small hiatal hernia   HYSTEROSCOPY WITH D & C N/A 03/06/2019   Procedure: DILATATION AND CURETTAGE /HYSTEROSCOPY;  Surgeon: Tilda Burrow, MD;  Location: [facility] ORS;  Service: Gynecology;  Laterality: N/A;   MALONEY DILATION N/A 11/11/2017   Procedure: Elease Hashimoto DILATION;  Surgeon: Corbin Ade, MD;  [facility]: [facility];  Service: Endoscopy;  Laterality: N/A;   PERIPHERAL VASCULAR BALLOON ANGIOPLASTY Left 07/11/2023   Procedure: PERIPHERAL VASCULAR BALLOON ANGIOPLASTY;  Surgeon: Maeola Harman, MD;  Location: [facility] INVASIVE CV LAB;  Service: Cardiovascular;  Laterality: Left;  External Iliac   PERIPHERAL VASCULAR INTERVENTION Bilateral 07/11/2023   Procedure: PERIPHERAL VASCULAR INTERVENTION;  Surgeon: Maeola Harman, MD;  Location: [facility] INVASIVE CV LAB;  Service: Cardiovascular;  Laterality: Bilateral;  Bilateral Common Iliac; L External Iliac   POLYPECTOMY N/A 03/06/2019   Procedure: POLYPECTOMY ( REMOVAL OF ENDOMETRIAL POLYP);  Surgeon: Tilda Burrow, MD;  Location: [facility];  Service: Gynecology;  Laterality: N/A;   POLYPECTOMY  04/15/2021  Procedure: POLYPECTOMY;  Surgeon: Corbin Ade, MD;  [facility];  Service: Endoscopy;;   TUBAL LIGATION     VENA CAVA UMBRELLA NECK APPROACH N/A 07/26/2023   Procedure: INSERTION, UMBRELLA FILTER, INFERIOR VENA CAVA FEMORAL ARTERY;  Surgeon: Maeola Harman, MD;  Location: [facility]  OR;  Service: Vascular;  Laterality: N/A;   Current Outpatient Medications on File Prior to Visit  Medication Sig Dispense Refill   acetaminophen (TYLENOL) 500 MG tablet Take 500 mg by mouth every 6 (six) hours as needed for moderate pain or headache.     alendronate (FOSAMAX) 70 MG tablet TAKE 1 TABLET(70 MG) BY MOUTH EVERY 7 DAYS WITH A FULL GLASS OF WATER AND ON AN EMPTY STOMACH 4 tablet 11   amLODipine (NORVASC) 10 MG tablet Take 10 mg by mouth at bedtime.     carvedilol (COREG) 3.125 MG tablet Take 2 tablets (6.25 mg total) by mouth 2 (two) times daily with a meal. 180 tablet 0   Cholecalciferol (VITAMIN D) 50 MCG (2000 UT) CAPS Take 2,000 Units by mouth daily.     clopidogrel (PLAVIX) 75 MG tablet Take 1 tablet (75 mg total) by mouth daily. 90 tablet 0   Coenzyme Q10 (COQ10) 100 MG CAPS Take 100 mg by mouth daily.     CRANBERRY PO Take 1 each by mouth 2 (two) times a week. Chewables     Cyanocobalamin (B-12) 2500 MCG TABS Take 2,500 mcg by mouth daily.     dexlansoprazole (DEXILANT) 60 MG capsule Take 1 capsule (60 mg total) by mouth daily. 30 capsule 11   EPINEPHrine 0.3 mg/0.3 mL IJ SOAJ injection Inject 0.3 mg into the muscle as needed for anaphylaxis. 1 each 1   fluticasone (FLONASE) 50 MCG/ACT nasal spray Place 2 sprays into both nostrils daily as needed for allergies.     hydroquinone 4 % cream Apply 1 application topically 2 (two) times daily as needed (dark spots).     ipratropium (ATROVENT) 0.03 % nasal spray INSTILL 2 SPRAYS IN EACH NOSTRIL EVERY 12 HOURS (Patient taking differently: Place 2 sprays into both nostrils 2 (two) times daily as needed for rhinitis.) 30 mL 12   loratadine (CLARITIN) 10 MG tablet Take 10 mg by mouth daily as needed for allergies.     MAGNESIUM PO Take 330 mg by mouth daily.     ondansetron (ZOFRAN) 4 MG tablet TAKE 1 TABLET(4 MG) BY MOUTH EVERY 8 HOURS AS NEEDED FOR NAUSEA OR VOMITING 30 tablet 0   rosuvastatin (CRESTOR) 20 MG tablet Take 1 tablet (20  mg total) by mouth daily. 90 tablet 3   sodium chloride (OCEAN) 0.65 % SOLN nasal spray Place 1 spray into both nostrils as needed for congestion.     triamcinolone cream (KENALOG) 0.1 % Apply 1 Application topically 2 (two) times daily. (Patient taking differently: Apply 1 Application topically 2 (two) times daily as needed (irritation).) 30 g 0   vitamin E 400 UNIT capsule Take 400 Units by mouth 3 (three) times a week.     zinc gluconate 50 MG tablet Take 50 mg by mouth 3 (three) times a week.     Current Facility-Administered Medications on File Prior to Visit  Medication Dose Route Frequency Provider Last Rate Last Admin   nitroGLYCERIN (NITROSTAT) SL tablet 0.4 mg  0.4 mg Sublingual Once             Allergies  Allergen Reactions   Ciprofloxacin Shortness Of Breath   Latex  Other (See Comments)    Burns skin   Vitamin C [Bioflavonoid Products] Itching   Social History   Socioeconomic History   Marital status: Single    Spouse name: Not on file   Number of children: 3   Years of education: Not on file   Highest education level: 10th grade  Occupational History   Occupation: Public affairs consultant: [employer]  Tobacco Use   Smoking status: Former    Current packs/day: 0.00    Average packs/day: 0.3 packs/day for 35.0 years (8.8 ttl pk-yrs)    Types: Cigarettes    Start date: 12/31/1977    Quit date: 12/31/2012    Years since quitting: 10.5   Smokeless tobacco: Never  Vaping Use   Vaping status: Never Used  Substance and Sexual Activity   Alcohol use: Not Currently   Drug use: No   Sexual activity: Not Currently    Partners: Male    Birth control/protection: Post-menopausal, Surgical    Comment: tubal  Other Topics Concern   Not on file  Social History Narrative   Eats all food groups.    Wears seatbelt.    Has 3 children.   Prior smoker.   Attends church.    Used to work in Designer, fashion/clothing.    Drives.   Enjoys going out with family.    Right handed    One story home   Social Drivers of Health   Financial Resource Strain: Low Risk  (06/02/2023)   Overall Financial Resource Strain (CARDIA)    Difficulty of Paying Living Expenses: Not very hard  Food Insecurity: No Food Insecurity (08/02/2023)   Hunger Vital Sign    Worried About Running Out of Food in the Last Year: Never true    Ran Out of Food in the Last Year: Never true  Transportation Needs: No Transportation Needs (08/02/2023)   PRAPARE - Administrator, Civil Service (Medical): No    Lack of Transportation (Non-Medical): No  Physical Activity: Sufficiently Active (06/02/2023)   Exercise Vital Sign    Days of Exercise per Week: 2 days    Minutes of Exercise per Session: 90 min  Recent Concern: Physical Activity - Insufficiently Active (03/18/2023)   Exercise Vital Sign    Days of Exercise per Week: 3 days    Minutes of Exercise per Session: 30 min  Stress: Stress Concern Present (06/02/2023)   [employer] of Occupational Health - Occupational Stress Questionnaire    Feeling of Stress : To some extent  Social Connections: Unknown (07/27/2023)   Social Connection and Isolation Panel [NHANES]    Frequency of Communication with Friends and Family: More than three times a week    Frequency of Social Gatherings with Friends and Family: Three times a week    Attends Religious Services: More than 4 times per year    Active Member of Clubs or Organizations: Yes    Attends Banker Meetings: More than 4 times per year    Marital Status: Patient declined  Intimate Partner Violence: Not At Risk (08/02/2023)   Humiliation, Afraid, Rape, and Kick questionnaire    Fear of Current or Ex-Partner: No    Emotionally Abused: No    Physically Abused: No    Sexually Abused: No   Family History  Problem Relation Age of Onset   Diabetes Other    Diabetes Mother    Heart disease Mother    Heart disease Father    Diabetes  Sister    Diabetes Brother    Hypertension  Brother    Diabetes Daughter    Hypertension Maternal Grandmother    Stroke Maternal Grandfather    Early death Paternal Grandfather    Colon cancer Neg Hx    Liver disease Neg Hx       Review of Systems  All other systems reviewed and are negative.      Objective:   Physical Exam Vitals reviewed.  Constitutional:      General: She is not in acute distress.    Appearance: Normal appearance. She is well-developed and normal weight. She is not ill-appearing.  HENT:     Right Ear: Tympanic membrane and ear canal normal.     Left Ear: Tympanic membrane and ear canal normal.     Nose: No congestion or rhinorrhea.     Right Sinus: No maxillary sinus tenderness or frontal sinus tenderness.     Left Sinus: No maxillary sinus tenderness or frontal sinus tenderness.  Eyes:     General: No visual field deficit.    Extraocular Movements: Extraocular movements intact.     Right eye: Normal extraocular motion and no nystagmus.     Left eye: Normal extraocular motion and no nystagmus.     Conjunctiva/sclera: Conjunctivae normal.     Pupils: Pupils are equal, round, and reactive to light.     Right eye: Pupil is round and reactive.     Left eye: Pupil is round and reactive.  Neck:     Vascular: No carotid bruit.  Cardiovascular:     Rate and Rhythm: Normal rate and regular rhythm.     Heart sounds: Normal heart sounds. No murmur heard.    No friction rub. No gallop.  Pulmonary:     Effort: Pulmonary effort is normal. No respiratory distress.     Breath sounds: Normal breath sounds. No stridor. No wheezing or rales.  Abdominal:     General: Abdomen is flat. Bowel sounds are normal. There is no distension.     Palpations: Abdomen is soft.     Tenderness: There is no abdominal tenderness. There is no guarding.  Musculoskeletal:     Right hand: Normal. No swelling, deformity, tenderness or bony tenderness. Normal range of motion.     Left hand: Normal. No swelling, deformity,  tenderness or bony tenderness. Normal range of motion.     Cervical back: Normal range of motion. No rigidity.     Right lower leg: Normal. No swelling, deformity, tenderness or bony tenderness. No edema.     Left lower leg: No deformity, tenderness or bony tenderness.     Right ankle: Normal. No swelling.     Left ankle: Normal. No swelling.  Lymphadenopathy:     Cervical: No cervical adenopathy.  Neurological:     General: No focal deficit present.     Mental Status: She is alert and oriented to person, place, and time. Mental status is at baseline.     Cranial Nerves: No cranial nerve deficit, dysarthria or facial asymmetry.     Sensory: No sensory deficit.     Motor: Tremor present. No weakness or abnormal muscle tone.     Coordination: Romberg sign negative. Coordination normal.     Gait: Gait normal.  Psychiatric:        Mood and Affect: Mood normal.        Behavior: Behavior normal.        Thought Content: Thought content normal.  Judgment: Judgment normal.         Assessment & Plan:  Benign essential HTN - Plan: Metanephrines, urine, 24 hour, Aldosterone + renin activity w/ ratio Blood pressure is running too high.  Continue carvedilol 6.25 milligrams twice daily.  Continue Norvasc 10 mg daily.  Resume Avapro 150 mg daily and recheck blood pressure in 2 weeks.  I gave patient a prescription of clonidine 0.1 mg tablets.  She can take 1 tablet every 8 hours as needed for systolic blood pressure greater than 180/100 given her elevated and labile blood pressures.  The patient had an angiogram of the abdomen in 2021 that showed no evidence of fibromuscular dysplasia or renal artery stenosis.  I will check a plasma aldosterone concentration and renin activity to rule out hyperaldosteronism.  I will also check a 24-hour urine metanephrine to rule out pheochromocytoma although on a recent CT scan in March there was no evidence of an adrenal adenoma.

## 2023-08-02 NOTE — Transitions of Care (Post Inpatient/ED Visit) (Signed)
 08/02/2023  Name: [name] MRN: [id number] DOB: [birth year]  Today's TOC FU Call Status: Today's TOC FU Call Status:: Successful TOC FU Call Completed TOC FU Call Complete Date: 08/02/23 Patient's Name and Date of Birth confirmed.  Transition Care Management Follow-up Telephone Call Date of Discharge: 07/28/23 Discharge Facility: [facility]) Type of Discharge: Inpatient Admission Primary Inpatient Discharge Diagnosis:: Non-traumatic intracranial hemorrhage How have you been since you were released from the hospital?: Better Any questions or concerns?: No  Items Reviewed: Did you receive and understand the discharge instructions provided?: Yes Medications obtained,verified, and reconciled?: Yes (Medications Reviewed) Any new allergies since your discharge?: No Dietary orders reviewed?: Yes Type of Diet Ordered:: LOW SODIUM HEART HEALTHY DIET Do you have support at home?: Yes People in Home: alone Name of Support/Comfort Primary Source: [name]  Medications Reviewed Today: Medications Reviewed Today     Reviewed by Luella Cook, RN (Case Manager) on 08/02/23 at [phone]  Med List Status: <None>   Medication Order Taking? Sig Documenting Provider Last Dose Status Informant  acetaminophen (TYLENOL) 500 MG tablet [id number] Yes Take 500 mg by mouth every 6 (six) hours as needed for moderate pain or headache. [provider] Taking Active Self  alendronate (FOSAMAX) 70 MG tablet [id number] Yes TAKE 1 TABLET(70 MG) BY MOUTH EVERY 7 DAYS WITH A FULL GLASS OF WATER AND ON AN EMPTY STOMACH [name], MD Taking Active Self           Med Note (SATTERFIELD, Genoveva Ill   Tue Jul 26, 2023  9:37 PM) Take on Sundays  amLODipine (NORVASC) 10 MG tablet [id number] Yes Take 10 mg by mouth at bedtime. [provider] Taking Active Self  carvedilol (COREG) 3.125 MG tablet [id number] Yes Take 2 tablets (6.25 mg total) by mouth 2 (two) times daily with a  meal. Countryman, Chase, MD Taking Active   Cholecalciferol (VITAMIN D) 50 MCG (2000 UT) CAPS [id number] Yes Take 2,000 Units by mouth daily. [provider] Taking Active Self  clopidogrel (PLAVIX) 75 MG tablet [id number] Yes Take 1 tablet (75 mg total) by mouth daily. Lynnae January, NP Taking Active   Coenzyme Q10 (COQ10) 100 MG CAPS [id number] Yes Take 100 mg by mouth daily. [provider] Taking Active Self  CRANBERRY PO [id number] Yes Take 1 each by mouth 2 (two) times a week. Chewables [provider] Taking Active Self  Cyanocobalamin (B-12) 2500 MCG TABS [id number] Yes Take 2,500 mcg by mouth daily. [provider] Taking Active Self  dexlansoprazole (DEXILANT) 60 MG capsule [id number] Yes Take 1 capsule (60 mg total) by mouth daily. Corbin Ade, MD Taking Active Self  EPINEPHrine 0.3 mg/0.3 mL IJ SOAJ injection [id number]  Inject 0.3 mg into the muscle as needed for anaphylaxis. [name], MD  Active Self           Med Note Vic Ripper Jul 11, 2023 12:18 PM) Never used as of 07/11/23.  fluticasone (FLONASE) 50 MCG/ACT nasal spray [id number] Yes Place 2 sprays into both nostrils daily as needed for allergies. [provider] Taking Active Self  hydroquinone 4 % cream [id number] Yes Apply 1 application topically 2 (two) times daily as needed (dark spots). [provider] Taking Active Self  ipratropium (ATROVENT) 0.03 % nasal spray [id number] Yes INSTILL 2 SPRAYS IN EACH NOSTRIL EVERY 12 HOURS  Patient taking differently: Place 2 sprays into both nostrils 2 (two) times daily as needed  for rhinitis.   [name], MD Taking Active Self  loratadine (CLARITIN) 10 MG tablet [id number] Yes Take 10 mg by mouth daily as needed for allergies. [provider] Taking Active Self  MAGNESIUM PO [id number] Yes Take 330 mg by mouth daily. [provider] Taking Active Self  nitroGLYCERIN (NITROSTAT) SL tablet 0.4 mg  [id number]   Park Meo, FNP  Active   ondansetron (ZOFRAN) 4 MG tablet [id number]  TAKE 1 TABLET(4 MG) BY MOUTH EVERY 8 HOURS AS NEEDED FOR NAUSEA OR VOMITING [name], PA-C  Active Self           Med Note (SATTERFIELD, DARIUS E   Tue Jul 26, 2023  9:47 PM) Still has on hand   rosuvastatin (CRESTOR) 20 MG tablet [id number] Yes Take 1 tablet (20 mg total) by mouth daily. [name], MD Taking Active Self  sodium chloride (OCEAN) 0.65 % SOLN nasal spray [id number] Yes Place 1 spray into both nostrils as needed for congestion. [provider] Taking Active Self  triamcinolone cream (KENALOG) 0.1 % [id number] Yes Apply 1 Application topically 2 (two) times daily.  Patient taking differently: Apply 1 Application topically 2 (two) times daily as needed (irritation).   [name], MD Taking Active Self  vitamin E 400 UNIT capsule [id number] Yes Take 400 Units by mouth 3 (three) times a week. [provider] Taking Active Self           Med Note (SATTERFIELD, DARIUS E   Tue Jul 26, 2023  9:46 PM) No specific days   zinc gluconate 50 MG tablet [id number] Yes Take 50 mg by mouth 3 (three) times a week. [provider] Taking Active Self           Med Note (SATTERFIELD, DARIUS E   Tue Jul 26, 2023  9:47 PM) No specific days             Home Care and Equipment/Supplies: Were Home Health Services Ordered?: NA Any new equipment or medical supplies ordered?: NA  Functional Questionnaire: Do you need assistance with bathing/showering or dressing?: No Do you need assistance with meal preparation?: No Do you need assistance with eating?: No Do you have difficulty maintaining continence: No Do you need assistance with getting out of bed/getting out of a chair/moving?: No Do you have difficulty managing or taking your medications?: No  Follow up appointments reviewed: PCP Follow-up appointment confirmed?: Yes Date of PCP follow-up appointment?:  09/02/23 Follow-up Provider: Thayer Jew Follow-up appointment confirmed?: Yes Date of Specialist follow-up appointment?: 08/23/23 Follow-Up Specialty Provider:: [id number] Marlowe Kays Neurology/ [id number] Vascular surgery Do you need transportation to your follow-up appointment?: No Do you understand care options if your condition(s) worsen?: Yes-patient verbalized understanding  SDOH Interventions Today    Flowsheet Row Most Recent Value  SDOH Interventions   Food Insecurity Interventions Intervention Not Indicated  Housing Interventions Intervention Not Indicated  Transportation Interventions Intervention Not Indicated, Patient Resources (Friends/Family)  Utilities Interventions Intervention Not Indicated      Interventions Today    Flowsheet Row Most Recent Value  Chronic Disease   Chronic disease during today's visit Other  [Non-traumatic intracranial hemorrhage]  General Interventions   General Interventions Discussed/Reviewed General Interventions Discussed, General Interventions Reviewed, Doctor Visits, Communication with, Referral to Nurse  Doctor Visits Discussed/Reviewed Doctor Visits Discussed, Doctor Visits Reviewed, PCP, Specialist  PCP/Specialist Visits Compliance with follow-up visit  Communication with RN  Halford Chessman to Irving Shows Case manager  for [facility] program follow up]  Education Interventions   Provided Verbal Education On Nutrition, Other, Insurance Plans  [Blood pressure mmonitoring and TOC services]  Nutrition Interventions   Nutrition Discussed/Reviewed Nutrition Discussed, Nutrition Reviewed  [Low sodium heart healthy diet]  Pharmacy Interventions   Pharmacy Dicussed/Reviewed Pharmacy Topics Discussed, Pharmacy Topics Reviewed       Goals Addressed             This Visit's Progress    TOC Care Plan       Current Barriers:  Knowledge Deficits related to plan of care for management of Non-Traumatic intracranial hemorrhage    RNCM Clinical Goal(s):  Patient will work with the Care Management team over the next 30 days to address Transition of Care Barriers: Medication Management take all medications exactly as prescribed and will call provider for medication related questions as evidenced by Electronic Medical Record  attend all scheduled medical appointments: PCP and Specialists as evidenced by Electronic Medical Record  through collaboration with RN Care manager, provider, and care team.   Interventions: Evaluation of current treatment plan related to  self management and patient's adherence to plan as established by provider   Stroke:  (Status:New goal.) Short Term Goal Reviewed Importance of taking all medications as prescribed Reviewed Importance of attending all scheduled provider appointments Assessed social determinant of health barriers  Patient Goals/Self-Care Activities: Participate in Transition of Care Program/Attend [facility] scheduled calls Notify RN Care Manager of TOC call rescheduling needs Take all medications as prescribed Attend all scheduled provider appointments Call pharmacy for medication refills 3-7 days in advance of running out of medications Perform all self care activities independently  Call provider office for new concerns or questions   Follow Up Plan:  Telephone follow up appointment with care management team member scheduled for:  Irving Shows RN Case Manager [phone] 10:00 The patient has been provided with contact information for the care management team and has been advised to call with any health related questions or concerns.  Next PCP appointment scheduled for: 09/02/2023        Patient consented to Irving Shows RN [facility] follow up Patient is in the EMMI stroke program  Gean Maidens BSN RN [facility] Health Care Management Coordinator [email or web address] Direct Dial: [phone]  Fax: [phone] Website: [email or web address]

## 2023-08-02 NOTE — Patient Outreach (Signed)
 Emmi Stroke Care Coordination Follow Up  08/02/2023 Name:  [name] MRN:  [id number] DOB:  12-Jun-1955  Subjective: [name] is a 68 y.o. year old female who is a primary care patient of Kimberly Brooks, MD An Emmi alert was received indicating patient responded to questions: Scheduled a follow-up appointment? Went to follow-up appointment?. I reached out by phone to follow up on the alert and spoke to Patient.  Patient will be seeing primary care provider today at 415 pm.  Patient is enrolled in [facility] 30 day program, previously today.  Care Coordination Interventions:  Yes, provided   Interventions Today    Flowsheet Row Most Recent Value  Chronic Disease   Chronic disease during today's visit Other  [stroke]  General Interventions   Doctor Visits Discussed/Reviewed Doctor Visits Discussed, Doctor Visits Reviewed  [pt has already been contacted today and TOC was completed, SDOH and medications reviewed]        Follow up plan: No further intervention required.   Encounter Outcome:  Patient Visit Completed   Irving Shows [facility], BSN RN Care Manager/ Transition of Care [place]/ [facility] [phone]

## 2023-08-04 ENCOUNTER — Other Ambulatory Visit: Payer: Self-pay

## 2023-08-04 DIAGNOSIS — I10 Essential (primary) hypertension: Secondary | ICD-10-CM | POA: Diagnosis not present

## 2023-08-07 ENCOUNTER — Emergency Department (HOSPITAL_COMMUNITY)
Admission: EM | Admit: 2023-08-07 | Discharge: 2023-08-07 | Disposition: A | Discharge status: Home / Self Care | Attending: Emergency Medicine | Admitting: Emergency Medicine

## 2023-08-07 ENCOUNTER — Encounter (HOSPITAL_COMMUNITY): Payer: Self-pay | Admitting: *Deleted

## 2023-08-07 ENCOUNTER — Emergency Department (HOSPITAL_COMMUNITY)

## 2023-08-07 ENCOUNTER — Other Ambulatory Visit: Payer: Self-pay

## 2023-08-07 DIAGNOSIS — R079 Chest pain, unspecified: Secondary | ICD-10-CM | POA: Diagnosis not present

## 2023-08-07 DIAGNOSIS — Z79899 Other long term (current) drug therapy: Secondary | ICD-10-CM | POA: Diagnosis not present

## 2023-08-07 DIAGNOSIS — Z91040 Latex allergy status: Secondary | ICD-10-CM | POA: Insufficient documentation

## 2023-08-07 DIAGNOSIS — I700 Atherosclerosis of aorta: Secondary | ICD-10-CM | POA: Diagnosis not present

## 2023-08-07 DIAGNOSIS — I10 Essential (primary) hypertension: Secondary | ICD-10-CM | POA: Diagnosis not present

## 2023-08-07 DIAGNOSIS — R0789 Other chest pain: Secondary | ICD-10-CM | POA: Diagnosis not present

## 2023-08-07 HISTORY — DX: Cerebral infarction, unspecified: I63.9

## 2023-08-07 LAB — CBC
HCT: 34.2 % — ABNORMAL LOW (ref 36.0–46.0)
Hemoglobin: 11.2 g/dL — ABNORMAL LOW (ref 12.0–15.0)
MCH: 30.3 pg (ref 26.0–34.0)
MCHC: 32.7 g/dL (ref 30.0–36.0)
MCV: 92.4 fL (ref 80.0–100.0)
Platelets: 322 10*3/uL (ref 150–400)
RBC: 3.7 MIL/uL — ABNORMAL LOW (ref 3.87–5.11)
RDW: 12.8 % (ref 11.5–15.5)
WBC: 6.2 10*3/uL (ref 4.0–10.5)
nRBC: 0 % (ref 0.0–0.2)

## 2023-08-07 LAB — BASIC METABOLIC PANEL WITH GFR
Anion gap: 10 (ref 5–15)
BUN: 6 mg/dL — ABNORMAL LOW (ref 8–23)
CO2: 23 mmol/L (ref 22–32)
Calcium: 9.4 mg/dL (ref 8.9–10.3)
Chloride: 104 mmol/L (ref 98–111)
Creatinine, Ser: 0.75 mg/dL (ref 0.44–1.00)
GFR, Estimated: >60 mL/min (ref >60)
Glucose, Bld: 92 mg/dL (ref 70–99)
Potassium: 4.1 mmol/L (ref 3.5–5.1)
Sodium: 137 mmol/L (ref 135–145)

## 2023-08-07 LAB — TROPONIN I (HIGH SENSITIVITY)
Troponin I (High Sensitivity): 7 ng/L (ref <18)
Troponin I (High Sensitivity): 10 ng/L (ref <18)

## 2023-08-07 NOTE — Discharge Instructions (Addendum)
 You were seen in the ED today for concern for chest pain and high blood pressure.  Your troponins were negative at 7 and 10.  Your hemoglobin is stable, no leukocytosis.  No electrolyte abnormalities.  Good kidney function.  Please see your primary care doctor for further alterations to your medication regimen for high blood pressure.  At the time of discharge, your blood pressure was 149/60.

## 2023-08-07 NOTE — ED Notes (Signed)
 Urine sample sent to main lab...KM

## 2023-08-07 NOTE — ED Triage Notes (Signed)
 The pt is here with high bp and chest pain recent stroke march 3rd during a surgical procedure and since then her bp has not been regulated.  Chest tightness today she takes plavix

## 2023-08-07 NOTE — ED Provider Notes (Signed)
 [place] EMERGENCY DEPARTMENT AT [facility] Provider Note   CSN: [id number] Arrival date & time: 08/07/23  2006     History  Chief Complaint  Patient presents with   Chest Pain   Hypertension    [name] is a 68 y.o. female with a significant past medical history as per chart who presents to the ED today with concern for chest pain as well as high blood pressure.  Daughter, a Engineer, civil (consulting), was concerned about patient's labile blood pressures with elevation between 1 and 5 PM every day.  Today, patient started endorsing chest pain and daughter felt that patient needed to be evaluated in the ED given the symptoms.  Patient's blood pressure at home was noted to be 215/110.  Patient has a history significant for ischemic stroke as well as hemorrhagic stroke.  Patient also has a history of DVT and her anticoagulation has been changed given hemorrhagic stroke.  Patient has an IVC filter in place.  Chest Pain Hypertension Associated symptoms include chest pain.     Home Medications Prior to Admission medications   Medication Sig Start Date End Date Taking? Authorizing Provider  acetaminophen (TYLENOL) 500 MG tablet Take 500 mg by mouth every 6 (six) hours as needed for moderate pain or headache.    [provider]  alendronate (FOSAMAX) 70 MG tablet TAKE 1 TABLET(70 MG) BY MOUTH EVERY 7 DAYS WITH A FULL GLASS OF WATER AND ON AN EMPTY STOMACH 07/06/23   [name], MD  amLODipine (NORVASC) 10 MG tablet Take 10 mg by mouth at bedtime.    [provider]  carvedilol (COREG) 3.125 MG tablet Take 2 tablets (6.25 mg total) by mouth 2 (two) times daily with a meal. 07/30/23   [name], MD  Cholecalciferol (VITAMIN D) 50 MCG (2000 UT) CAPS Take 2,000 Units by mouth daily.    [provider]  cloNIDine (CATAPRES) 0.1 MG tablet Take 1 tablet (0.1 mg total) by mouth 3 (three) times daily as needed (180/100). 08/02/23   [name], MD   clopidogrel (PLAVIX) 75 MG tablet Take 1 tablet (75 mg total) by mouth daily. 07/28/23   Lynnae January, NP  Coenzyme Q10 (COQ10) 100 MG CAPS Take 100 mg by mouth daily.    [provider]  CRANBERRY PO Take 1 each by mouth 2 (two) times a week. Chewables    [provider]  Cyanocobalamin (B-12) 2500 MCG TABS Take 2,500 mcg by mouth daily.    [provider]  dexlansoprazole (DEXILANT) 60 MG capsule Take 1 capsule (60 mg total) by mouth daily. 05/18/22   [name], [name] Friends, MD  EPINEPHrine 0.3 mg/0.3 mL IJ SOAJ injection Inject 0.3 mg into the muscle as needed for anaphylaxis. 03/12/22   [name], MD  fluticasone (FLONASE) 50 MCG/ACT nasal spray Place 2 sprays into both nostrils daily as needed for allergies. 01/23/23   [provider]  hydroquinone 4 % cream Apply 1 application topically 2 (two) times daily as needed (dark spots). 01/05/21   [provider]  ipratropium (ATROVENT) 0.03 % nasal spray INSTILL 2 SPRAYS IN EACH NOSTRIL EVERY 12 HOURS Patient taking differently: Place 2 sprays into both nostrils 2 (two) times daily as needed for rhinitis. 04/17/21   [name], MD  irbesartan (AVAPRO) 150 MG tablet Take 1 tablet (150 mg total) by mouth daily. 08/02/23   [name], MD  loratadine (CLARITIN) 10 MG tablet Take 10 mg  by mouth daily as needed for allergies.    [provider]  MAGNESIUM PO Take 330 mg by mouth daily.    [provider]  ondansetron (ZOFRAN) 4 MG tablet TAKE 1 TABLET(4 MG) BY MOUTH EVERY 8 HOURS AS NEEDED FOR NAUSEA OR VOMITING 03/06/21   [name], PA-C  rosuvastatin (CRESTOR) 20 MG tablet Take 1 tablet (20 mg total) by mouth daily. 07/22/23   [name], MD  sodium chloride (OCEAN) 0.65 % SOLN nasal spray Place 1 spray into both nostrils as needed for congestion.    [provider]  triamcinolone cream (KENALOG) 0.1 % Apply 1 Application topically 2 (two) times daily. Patient  taking differently: Apply 1 Application topically 2 (two) times daily as needed (irritation). 10/29/21   [name], MD  vitamin E 400 UNIT capsule Take 400 Units by mouth 3 (three) times a week.    [provider]  zinc gluconate 50 MG tablet Take 50 mg by mouth 3 (three) times a week.    [provider]      Allergies    Ciprofloxacin, Latex, and Vitamin c [bioflavonoid products]    Review of Systems   Review of Systems  Cardiovascular:  Positive for chest pain.    Physical Exam Updated Vital Signs BP 133/60   Pulse 61   Temp 98.2 F (36.8 C) (Oral)   Resp 14   Ht 5\' 7"  (1.702 m)   Wt 73.4 kg   SpO2 99%   BMI 25.34 kg/m  Physical Exam Vitals and nursing note reviewed.  Constitutional:      General: She is not in acute distress.    Appearance: She is not ill-appearing.  HENT:     Mouth/Throat:     Lips: Pink. No lesions.     Mouth: Mucous membranes are moist.     Pharynx: Oropharynx is clear.  Eyes:     Pupils: Pupils are equal, round, and reactive to light.  Cardiovascular:     Rate and Rhythm: Normal rate and regular rhythm.     Pulses:          Radial pulses are 2+ on the right side and 2+ on the left side.       Dorsalis pedis pulses are 2+ on the right side and 2+ on the left side.     Heart sounds: Normal heart sounds.  Pulmonary:     Effort: Pulmonary effort is normal.     Breath sounds: Normal breath sounds.  Abdominal:     Palpations: Abdomen is soft.     Tenderness: There is no abdominal tenderness.  Skin:    General: Skin is warm and dry.     Capillary Refill: Capillary refill takes less than 2 seconds.  Neurological:     Mental Status: She is alert.  Psychiatric:        Behavior: Behavior is cooperative.     ED Results / Procedures / Treatments   Labs (all labs ordered are listed, but only abnormal results are displayed) Labs Reviewed  BASIC METABOLIC PANEL WITH GFR - Abnormal; Notable for the following components:       Result Value   BUN 6 (*)    All other components within normal limits  CBC - Abnormal; Notable for the following components:   RBC 3.70 (*)    Hemoglobin 11.2 (*)    HCT 34.2 (*)    All other components within normal limits  TROPONIN I (  HIGH SENSITIVITY)  TROPONIN I (HIGH SENSITIVITY)    EKG None  Radiology DG Chest 2 View Result Date: 08/07/2023 CLINICAL DATA:  Chest pain, hypertension EXAM: CHEST - 2 VIEW COMPARISON:  05/02/2023 FINDINGS: Heart and mediastinal contours are within normal limits. No focal opacities or effusions. No acute bony abnormality. Aortic atherosclerosis. IMPRESSION: No active cardiopulmonary disease. Electronically Signed   By: Charlett Nose M.D.   On: 08/07/2023 21:05    Procedures Procedures   Medications Ordered in ED Medications - No data to display  ED Course/ Medical Decision Making/ A&P           HEART Score: 4                    Medical Decision Making Amount and/or Complexity of Data Reviewed Labs: ordered. Radiology: ordered.   68 y.o. female with a significant past medical history as per chart who presents to the ED today with concern for chest pain as well as high blood pressure.  Initial differential includes ACS, PE, pneumonia, pneumothorax, anemia, electrolyte or metabolic derangement, pleural effusion.  Labs which were ordered in triage are reassuring with no leukocytosis, stable hemoglobin from patient's baseline, no electrolyte or metabolic derangements, appropriate glucose, appropriate renal and liver function.  Troponin stable at 7 and subsequently 10.  EKG reassuring.  Patient is in normal sinus rhythm with a rate of 76 bpm.  Appropriate PR, QRS, QTc intervals.  No significant change from prior EKG on 08/01/2023.  Given troponin and EKG, low suspicion for ACS.  Given the patient has an IVC filter in place, low suspicion for PE.  CXR shows no evidence of focal consolidation concerning for pneumonia, no pneumothorax, no pleural  effusion.  Patient's blood pressure on arrival was 192/82.  While I was in the room it was 149/60.  At the time of discharge, patient's blood pressure was 133/60 with reassuring vital signs with a HR of 61, respirations 14, afebrile, satting at 99% on room air.  Advised that patient should continue discussing blood pressure with PCP, Dr. Tanya Nones as he has been following her and adjusting her hypertension medications.  Educated on blood pressure rebound following clonidine.  Educated that as blood pressure is reassuring at this time, no medications should be administered in the ED to further drop her blood pressure at this time.  Advised reassurance given workup today and need to continue follow-up with PCP for further outpatient medication adjustments.  Final Clinical Impression(s) / ED Diagnoses Final diagnoses:  Nonspecific chest pain  Hypertension, unspecified type    Rx / DC Orders ED Discharge Orders     None      Renella Cunas, PGY-2 Emergency Medicine   Renella Cunas, MD 08/08/23 0002    Melene Plan, DO 08/08/23 1506

## 2023-08-08 ENCOUNTER — Telehealth: Payer: Self-pay

## 2023-08-08 ENCOUNTER — Other Ambulatory Visit: Payer: Self-pay

## 2023-08-08 ENCOUNTER — Telehealth: Payer: Self-pay | Admitting: Family Medicine

## 2023-08-08 DIAGNOSIS — I70221 Atherosclerosis of native arteries of extremities with rest pain, right leg: Secondary | ICD-10-CM

## 2023-08-08 NOTE — Transitions of Care (Post Inpatient/ED Visit) (Signed)
 08/08/2023  Name: [name] MRN: [id number] DOB: 03/29/1956  Today's TOC FU Call Status: Today's TOC FU Call Status:: Successful TOC FU Call Completed TOC FU Call Complete Date: 08/08/23 Patient's Name and Date of Birth confirmed.  Transition Care Management Follow-up Telephone Call Date of Discharge: 08/07/23 Discharge Facility: [facility]) Type of Discharge: Emergency Department Reason for ED Visit: Other: (hypertension) How have you been since you were released from the hospital?: Better Any questions or concerns?: No  Items Reviewed: Medications obtained,verified, and reconciled?: Yes (Medications Reviewed) Any new allergies since your discharge?: No Dietary orders reviewed?: Yes Do you have support at home?: No  Medications Reviewed Today: Medications Reviewed Today     Reviewed by Karena Addison, LPN (Licensed Practical Nurse) on 08/08/23 at 1415  Med List Status: <None>   Medication Order Taking? Sig Documenting Provider Last Dose Status Informant  acetaminophen (TYLENOL) 500 MG tablet [id number] No Take 500 mg by mouth every 6 (six) hours as needed for moderate pain or headache. [provider] Taking Active Self  alendronate (FOSAMAX) 70 MG tablet [id number] No TAKE 1 TABLET(70 MG) BY MOUTH EVERY 7 DAYS WITH A FULL GLASS OF WATER AND ON AN EMPTY STOMACH [name], MD Taking Active Self           Med Note (SATTERFIELD, Genoveva Ill   Tue Jul 26, 2023  9:37 PM) Take on Sundays  amLODipine (NORVASC) 10 MG tablet [id number] No Take 10 mg by mouth at bedtime. [provider] Taking Active Self  carvedilol (COREG) 3.125 MG tablet [id number] No Take 2 tablets (6.25 mg total) by mouth 2 (two) times daily with a meal. Countryman, Chase, MD Taking Active   Cholecalciferol (VITAMIN D) 50 MCG (2000 UT) CAPS [id number] No Take 2,000 Units by mouth daily. [provider] Taking Active Self  cloNIDine (CATAPRES) 0.1 MG tablet [id number]  Take 1  tablet (0.1 mg total) by mouth 3 (three) times daily as needed (180/100). [name], MD  Active   clopidogrel (PLAVIX) 75 MG tablet [id number] No Take 1 tablet (75 mg total) by mouth daily. Lynnae January, NP Taking Active   Coenzyme Q10 (COQ10) 100 MG CAPS [id number] No Take 100 mg by mouth daily. [provider] Taking Active Self  CRANBERRY PO [id number] No Take 1 each by mouth 2 (two) times a week. Chewables [provider] Taking Active Self  Cyanocobalamin (B-12) 2500 MCG TABS [id number] No Take 2,500 mcg by mouth daily. [provider] Taking Active Self  dexlansoprazole (DEXILANT) 60 MG capsule [id number] No Take 1 capsule (60 mg total) by mouth daily. Corbin Ade, MD Taking Active Self  EPINEPHrine 0.3 mg/0.3 mL IJ SOAJ injection [id number] No Inject 0.3 mg into the muscle as needed for anaphylaxis. [name], MD Taking Active Self           Med Note Vic Ripper Jul 11, 2023 12:18 PM) Never used as of 07/11/23.  fluticasone (FLONASE) 50 MCG/ACT nasal spray [id number] No Place 2 sprays into both nostrils daily as needed for allergies. [provider] Taking Active Self  hydroquinone 4 % cream [id number] No Apply 1 application topically 2 (two) times daily as needed (dark spots). [provider] Taking Active Self  ipratropium (ATROVENT) 0.03 % nasal spray [id number] No INSTILL 2 SPRAYS IN EACH NOSTRIL EVERY 12 HOURS  Patient taking differently: Place 2 sprays into both nostrils 2 (two) times daily as needed for  rhinitis.   [name], MD Taking Active Self  irbesartan (AVAPRO) 150 MG tablet [id number]  Take 1 tablet (150 mg total) by mouth daily. [name], MD  Active   loratadine (CLARITIN) 10 MG tablet [id number] No Take 10 mg by mouth daily as needed for allergies. [provider] Taking Active Self  MAGNESIUM PO [id number] No Take 330 mg by mouth daily. [provider] Taking Active Self   nitroGLYCERIN (NITROSTAT) SL tablet 0.4 mg [id number]   Park Meo, FNP  Active   ondansetron (ZOFRAN) 4 MG tablet [id number] No TAKE 1 TABLET(4 MG) BY MOUTH EVERY 8 HOURS AS NEEDED FOR NAUSEA OR VOMITING [name], PA-C Taking Active Self           Med Note (SATTERFIELD, DARIUS E   Tue Jul 26, 2023  9:47 PM) Still has on hand   rosuvastatin (CRESTOR) 20 MG tablet [id number] No Take 1 tablet (20 mg total) by mouth daily. [name], MD Taking Active Self  sodium chloride (OCEAN) 0.65 % SOLN nasal spray [id number] No Place 1 spray into both nostrils as needed for congestion. [provider] Taking Active Self  triamcinolone cream (KENALOG) 0.1 % [id number] No Apply 1 Application topically 2 (two) times daily.  Patient taking differently: Apply 1 Application topically 2 (two) times daily as needed (irritation).   [name], MD Taking Active Self  vitamin E 400 UNIT capsule [id number] No Take 400 Units by mouth 3 (three) times a week. [provider] Taking Active Self           Med Note (SATTERFIELD, DARIUS E   Tue Jul 26, 2023  9:46 PM) No specific days   zinc gluconate 50 MG tablet [id number] No Take 50 mg by mouth 3 (three) times a week. [provider] Taking Active Self           Med Note (SATTERFIELD, DARIUS E   Tue Jul 26, 2023  9:47 PM) No specific days             Home Care and Equipment/Supplies: Were Home Health Services Ordered?: NA Any new equipment or medical supplies ordered?: NA  Functional Questionnaire: Do you need assistance with bathing/showering or dressing?: No Do you need assistance with meal preparation?: No Do you need assistance with eating?: No Do you have difficulty maintaining continence: No Do you need assistance with getting out of bed/getting out of a chair/moving?: No Do you have difficulty managing or taking your medications?: No  Follow up appointments reviewed: PCP Follow-up appointment confirmed?:  Yes Date of PCP follow-up appointment?: 08/11/23 Follow-up Provider: [facility] Follow-up appointment confirmed?: NA Do you need transportation to your follow-up appointment?: No Do you understand care options if your condition(s) worsen?: Yes-patient verbalized understanding    SIGNATURE Karena Addison, LPN [facility] Nurse Health Advisor Direct Dial [phone]

## 2023-08-08 NOTE — Telephone Encounter (Signed)
 Prescription Request  08/08/2023  LOV: 08/02/2023  What is the name of the medication or equipment?   valsartan (DIOVAN) 320 MG tablet [[id number]]  DISCONTINUED   Have you contacted your pharmacy to request a refill? Yes   Which pharmacy would you like this sent to?  [id number] - [place], [place] - [address] AT [facility] [address] [place] [address] Phone: [phone] Fax: [phone]    Patient notified that their request is being sent to the clinical staff for review and that they should receive a response within 2 business days.   Please advise pharmacist.

## 2023-08-09 LAB — ALDOSTERONE + RENIN ACTIVITY W/ RATIO
ALDO / PRA Ratio: 6.9 ratio (ref 0.9–28.9)
Aldosterone: 5 ng/dL
Renin Activity: 0.72 ng/mL/h (ref 0.25–5.82)

## 2023-08-10 ENCOUNTER — Other Ambulatory Visit: Payer: Self-pay | Admitting: *Deleted

## 2023-08-10 ENCOUNTER — Telehealth: Payer: Self-pay

## 2023-08-10 NOTE — Patient Outreach (Signed)
 Care Management  Transitions of Care Program Transitions of Care Post-discharge week 2  08/10/2023 Name: [name] MRN: [id number] DOB: [birth year]  Subjective: [name] is a 68 y.o. year old female who is a primary care patient of [name], MD. The Care Management team was unable to reach the patient by phone to assess and address transitions of care needs.   Plan: Additional outreach attempts will be made to reach the patient enrolled in the [facility] Program (Post Inpatient/ED Visit).  Irving Shows [facility], BSN RN Care Manager/ Transition of Care [place]/ [facility] [phone]

## 2023-08-10 NOTE — Telephone Encounter (Signed)
 Prescription Request  08/10/2023  LOV: 08/02/23  What is the name of the medication or equipment? valsartan (DIOVAN) 320 MG tablet [[id number]]  DISCONTINUED   Have you contacted your pharmacy to request a refill? Yes   Which pharmacy would you like this sent to?  [id number] - [place], [place] - [address] AT [facility] [address] [place] [address] Phone: [phone] Fax: [phone]    Patient notified that their request is being sent to the clinical staff for review and that they should receive a response within 2 business days.   Please advise at [facility] [phone]

## 2023-08-11 ENCOUNTER — Encounter: Payer: Self-pay | Admitting: Family Medicine

## 2023-08-11 ENCOUNTER — Ambulatory Visit: Admitting: Family Medicine

## 2023-08-11 ENCOUNTER — Telehealth: Payer: Self-pay | Admitting: *Deleted

## 2023-08-11 VITALS — BP 144/62 | HR 64 | Temp 98.0°F | Ht 67.0 in | Wt 157.6 lb

## 2023-08-11 DIAGNOSIS — I10 Essential (primary) hypertension: Secondary | ICD-10-CM | POA: Diagnosis not present

## 2023-08-11 MED ORDER — IRBESARTAN 300 MG PO TABS: 300.0000 mg | ORAL_TABLET | Freq: Every day | ORAL | 3 refills | Status: DC

## 2023-08-11 MED ORDER — CLONIDINE 0.3 MG/24HR TD PTWK: 0.3000 mg | MEDICATED_PATCH | TRANSDERMAL | 12 refills | Status: DC

## 2023-08-11 NOTE — Patient Outreach (Signed)
 Care Management  Transitions of Care Program Transitions of Care Post-discharge week 2- 2nd attempt  08/11/2023 Name: [name] MRN: [id number] DOB: 1956/02/07  Subjective: [name] is a 67 y.o. year old female who is a primary care patient of [name], MD. The Care Management team was unable to reach the patient by phone to assess and address transitions of care needs.   Plan: Additional outreach attempts will be made to reach the patient enrolled in the [facility] Program (Post Inpatient/ED Visit).  Irving Shows [facility], BSN RN Care Manager/ Transition of Care [place]/ [facility] [phone]

## 2023-08-11 NOTE — Progress Notes (Signed)
 Subjective:    Patient ID: [name], female    DOB: October 13, 1955, 68 y.o.   MRN: [id number] 08/02/23 See my last office visit.  After my last visit, the patient returned to the hospital with left-sided numbness.  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  As a result, patient was taken off Eliquis and an IVC filter was due to her DVT in her leg.  They also discontinued aspirin and maintain the patient only on Eliquis for secondary prevention of future strokes.  Patient has had very difficult to control blood pressure since that visit.  Her systolic blood pressure is fluctuating between 160 and 200 despite taking carvedilol and amlodipine.  08/11/23 Patient went back to the emergency room on March 30 due to blood pressure that was greater than 230 systolic.  She stated that at home her blood pressure was 250/110.  She is currently been taking 300 mg of irbesartan, 10 mg of amlodipine, 6.25 mg of carvedilol twice daily, and clonidine occasionally.  There is some debate between the patient and her daughter whether she has been taking it once a day or twice a day.  Patient denies that anxiety is playing any role in her blood pressure going up.  She has been checking her blood pressure numerous times a day.  She is confused about when to go to the hospital. Past Medical History:  Diagnosis Date   Anxiety    Carotid stenosis, asymptomatic, bilateral    Cataract    Chronic left shoulder pain    Headache    Hiatal hernia    small   Hyperlipidemia    Hypertension    Osteoporosis    Pre-diabetes    Schatzki's ring    Seasonal allergies    Stroke [facility])    Tremor      Past Surgical History:  Procedure Laterality Date   ABDOMINAL AORTOGRAM W/LOWER EXTREMITY N/A 07/11/2023   Procedure: ABDOMINAL AORTOGRAM W/LOWER EXTREMITY;  Surgeon: Maeola Harman, MD;  Location: [facility] INVASIVE CV LAB;  Service: Cardiovascular;  Laterality: N/A;   CARDIAC  CATHETERIZATION  2005   "Ms. Rappleye has essentially normal coronary arteries and normal left ventricular function.  She will be treated empirically with anti-reflux measures"   CATARACT EXTRACTION, BILATERAL     CHOLECYSTECTOMY     COLONOSCOPY    06/16/2004   WUJ:WJXBJY rectum/Diminutive polyps of the splenic flexure and the cecum/The remainder of the colonic mucosa appeared normal pathology (hyperplastic polyps).   COLONOSCOPY N/A 08/07/2015   Dr. Jena Gauss: 6 mm descending colon tubular adenoma, multiple medium-mouthed diverticula in sigmoid colon. surveillance 2022   COLONOSCOPY N/A 04/15/2021   Procedure: COLONOSCOPY;  Surgeon: Corbin Ade, MD;  Location: [facility] ENDO SU[facility];  Service: Endoscopy;  Laterality: N/A;  9:30am   ESOPHAGOGASTRODUODENOSCOPY  09/11/2001   NWG:NFAOZHYQM ring otherwise normal esophagus/Normal stomach/Normal D1 and D2 status post passage of a 56 French Maloney dilator   ESOPHAGOGASTRODUODENOSCOPY N/A 11/11/2017   Procedure: ESOPHAGOGASTRODUODENOSCOPY (EGD);  Surgeon: Corbin Ade, MD; mild Schatzki ring s/p dilation and mild erosive gastropathy.   ESOPHAGOGASTRODUODENOSCOPY (EGD) WITH ESOPHAGEAL DILATION N/A 01/25/2013   Dr. Jena Gauss- schatzki's ring- 54 F dilation. small hiatal hernia   HYSTEROSCOPY WITH D & C N/A 03/06/2019   Procedure: DILATATION AND CURETTAGE /HYSTEROSCOPY;  Surgeon: Tilda Burrow, MD;  Location: [facility] ORS;  Service: Gynecology;  Laterality: N/A;   MALONEY DILATION N/A 11/11/2017   Procedure: Elease Hashimoto DILATION;  Surgeon: Eula Listen  M, MD;  Location: [facility];  Service: Endoscopy;  Laterality: N/A;   PERIPHERAL VASCULAR BALLOON ANGIOPLASTY Left 07/11/2023   Procedure: PERIPHERAL VASCULAR BALLOON ANGIOPLASTY;  Surgeon: Maeola Harman, MD;  Location: [facility] INVASIVE CV LAB;  Service: Cardiovascular;  Laterality: Left;  External Iliac   PERIPHERAL VASCULAR INTERVENTION Bilateral 07/11/2023   Procedure: PERIPHERAL VASCULAR INTERVENTION;  Surgeon:  Maeola Harman, MD;  Location: [facility] INVASIVE CV LAB;  Service: Cardiovascular;  Laterality: Bilateral;  Bilateral Common Iliac; L External Iliac   POLYPECTOMY N/A 03/06/2019   Procedure: POLYPECTOMY ( REMOVAL OF ENDOMETRIAL POLYP);  Surgeon: Tilda Burrow, MD;  Location: [facility] ORS;  Service: Gynecology;  Laterality: N/A;   POLYPECTOMY  04/15/2021   Procedure: POLYPECTOMY;  Surgeon: Corbin Ade, MD;  Location: [facility];  Service: Endoscopy;;   TUBAL LIGATION     VENA CAVA UMBRELLA NECK APPROACH N/A 07/26/2023   Procedure: INSERTION, UMBRELLA FILTER, INFERIOR VENA CAVA FEMORAL ARTERY;  Surgeon: Maeola Harman, MD;  Location: [facility] OR;  Service: Vascular;  Laterality: N/A;   Current Outpatient Medications on File Prior to Visit  Medication Sig Dispense Refill   acetaminophen (TYLENOL) 500 MG tablet Take 500 mg by mouth every 6 (six) hours as needed for moderate pain or headache.     alendronate (FOSAMAX) 70 MG tablet TAKE 1 TABLET(70 MG) BY MOUTH EVERY 7 DAYS WITH A FULL GLASS OF WATER AND ON AN EMPTY STOMACH 4 tablet 11   amLODipine (NORVASC) 10 MG tablet Take 10 mg by mouth at bedtime.     carvedilol (COREG) 3.125 MG tablet Take 2 tablets (6.25 mg total) by mouth 2 (two) times daily with a meal. 180 tablet 0   Cholecalciferol (VITAMIN D) 50 MCG (2000 UT) CAPS Take 2,000 Units by mouth daily.     cloNIDine (CATAPRES) 0.1 MG tablet Take 1 tablet (0.1 mg total) by mouth 3 (three) times daily as needed (180/100). 90 tablet 3   clopidogrel (PLAVIX) 75 MG tablet Take 1 tablet (75 mg total) by mouth daily. 90 tablet 0   Coenzyme Q10 (COQ10) 100 MG CAPS Take 100 mg by mouth daily.     CRANBERRY PO Take 1 each by mouth 2 (two) times a week. Chewables     Cyanocobalamin (B-12) 2500 MCG TABS Take 2,500 mcg by mouth daily.     dexlansoprazole (DEXILANT) 60 MG capsule Take 1 capsule (60 mg total) by mouth daily. 30 capsule 11   EPINEPHrine 0.3 mg/0.3 mL IJ SOAJ injection Inject 0.3 mg  into the muscle as needed for anaphylaxis. 1 each 1   fluticasone (FLONASE) 50 MCG/ACT nasal spray Place 2 sprays into both nostrils daily as needed for allergies.     hydroquinone 4 % cream Apply 1 application topically 2 (two) times daily as needed (dark spots).     ipratropium (ATROVENT) 0.03 % nasal spray INSTILL 2 SPRAYS IN EACH NOSTRIL EVERY 12 HOURS (Patient taking differently: Place 2 sprays into both nostrils 2 (two) times daily as needed for rhinitis.) 30 mL 12   loratadine (CLARITIN) 10 MG tablet Take 10 mg by mouth daily as needed for allergies.     MAGNESIUM PO Take 330 mg by mouth daily.     ondansetron (ZOFRAN) 4 MG tablet TAKE 1 TABLET(4 MG) BY MOUTH EVERY 8 HOURS AS NEEDED FOR NAUSEA OR VOMITING 30 tablet 0   rosuvastatin (CRESTOR) 20 MG tablet Take 1 tablet (20 mg total) by mouth daily. 90 tablet 3  sodium chloride (OCEAN) 0.65 % SOLN nasal spray Place 1 spray into both nostrils as needed for congestion.     triamcinolone cream (KENALOG) 0.1 % Apply 1 Application topically 2 (two) times daily. (Patient taking differently: Apply 1 Application topically 2 (two) times daily as needed (irritation).) 30 g 0   vitamin E 400 UNIT capsule Take 400 Units by mouth 3 (three) times a week.     zinc gluconate 50 MG tablet Take 50 mg by mouth 3 (three) times a week.     Current Facility-Administered Medications on File Prior to Visit  Medication Dose Route Frequency Provider Last Rate Last Admin   nitroGLYCERIN (NITROSTAT) SL tablet 0.4 mg  0.4 mg Sublingual Once             Allergies  Allergen Reactions   Ciprofloxacin Shortness Of Breath   Latex Other (See Comments)    Burns skin   Vitamin C [Bioflavonoid Products] Itching   Social History   Socioeconomic History   Marital status: Single    Spouse name: Not on file   Number of children: 3   Years of education: Not on file   Highest education level: 10th grade  Occupational History   Occupation: Public affairs consultant:  [employer]  Tobacco Use   Smoking status: Former    Current packs/day: 0.00    Average packs/day: 0.3 packs/day for 35.0 years (8.8 ttl pk-yrs)    Types: Cigarettes    Start date: 12/31/1977    Quit date: 12/31/2012    Years since quitting: 10.6   Smokeless tobacco: Never  Vaping Use   Vaping status: Never Used  Substance and Sexual Activity   Alcohol use: Not Currently   Drug use: No   Sexual activity: Not Currently    Partners: Male    Birth control/protection: Post-menopausal, Surgical    Comment: tubal  Other Topics Concern   Not on file  Social History Narrative   Eats all food groups.    Wears seatbelt.    Has 3 children.   Prior smoker.   Attends church.    Used to work in Designer, fashion/clothing.    Drives.   Enjoys going out with family.    Right handed   One story home   Social Drivers of Health   Financial Resource Strain: Low Risk  (06/02/2023)   Overall Financial Resource Strain (CARDIA)    Difficulty of Paying Living Expenses: Not very hard  Food Insecurity: No Food Insecurity (08/02/2023)   Hunger Vital Sign    Worried About Running Out of Food in the Last Year: Never true    Ran Out of Food in the Last Year: Never true  Transportation Needs: No Transportation Needs (08/02/2023)   PRAPARE - Administrator, Civil Service (Medical): No    Lack of Transportation (Non-Medical): No  Physical Activity: Sufficiently Active (06/02/2023)   Exercise Vital Sign    Days of Exercise per Week: 2 days    Minutes of Exercise per Session: 90 min  Recent Concern: Physical Activity - Insufficiently Active (03/18/2023)   Exercise Vital Sign    Days of Exercise per Week: 3 days    Minutes of Exercise per Session: 30 min  Stress: Stress Concern Present (06/02/2023)   [employer] of Occupational Health - Occupational Stress Questionnaire    Feeling of Stress : To some extent  Social Connections: Unknown (07/27/2023)   Social Connection and Isolation Panel  [NHANES]  Frequency of Communication with Friends and Family: More than three times a week    Frequency of Social Gatherings with Friends and Family: Three times a week    Attends Religious Services: More than 4 times per year    Active Member of Clubs or Organizations: Yes    Attends Banker Meetings: More than 4 times per year    Marital Status: Patient declined  Intimate Partner Violence: Not At Risk (08/02/2023)   Humiliation, Afraid, Rape, and Kick questionnaire    Fear of Current or Ex-Partner: No    Emotionally Abused: No    Physically Abused: No    Sexually Abused: No   Family History  Problem Relation Age of Onset   Diabetes Other    Diabetes Mother    Heart disease Mother    Heart disease Father    Diabetes Sister    Diabetes Brother    Hypertension Brother    Diabetes Daughter    Hypertension Maternal Grandmother    Stroke Maternal Grandfather    Early death Paternal Grandfather    Colon cancer Neg Hx    Liver disease Neg Hx       Review of Systems  All other systems reviewed and are negative.      Objective:   Physical Exam Vitals reviewed.  Constitutional:      General: She is not in acute distress.    Appearance: Normal appearance. She is well-developed and normal weight. She is not ill-appearing.  HENT:     Right Ear: Tympanic membrane and ear canal normal.     Left Ear: Tympanic membrane and ear canal normal.     Nose: No congestion or rhinorrhea.     Right Sinus: No maxillary sinus tenderness or frontal sinus tenderness.     Left Sinus: No maxillary sinus tenderness or frontal sinus tenderness.  Eyes:     General: No visual field deficit.    Extraocular Movements: Extraocular movements intact.     Right eye: Normal extraocular motion and no nystagmus.     Left eye: Normal extraocular motion and no nystagmus.     Conjunctiva/sclera: Conjunctivae normal.     Pupils: Pupils are equal, round, and reactive to light.     Right eye:  Pupil is round and reactive.     Left eye: Pupil is round and reactive.  Neck:     Vascular: No carotid bruit.  Cardiovascular:     Rate and Rhythm: Normal rate and regular rhythm.     Heart sounds: Normal heart sounds. No murmur heard.    No friction rub. No gallop.  Pulmonary:     Effort: Pulmonary effort is normal. No respiratory distress.     Breath sounds: Normal breath sounds. No stridor. No wheezing or rales.  Abdominal:     General: Abdomen is flat. Bowel sounds are normal. There is no distension.     Palpations: Abdomen is soft.     Tenderness: There is no abdominal tenderness. There is no guarding.  Musculoskeletal:     Right hand: Normal. No swelling, deformity, tenderness or bony tenderness. Normal range of motion.     Left hand: Normal. No swelling, deformity, tenderness or bony tenderness. Normal range of motion.     Cervical back: Normal range of motion. No rigidity.     Right lower leg: Normal. No swelling, deformity, tenderness or bony tenderness. No edema.     Left lower leg: No deformity, tenderness or bony tenderness.  Right ankle: Normal. No swelling.     Left ankle: Normal. No swelling.  Lymphadenopathy:     Cervical: No cervical adenopathy.  Neurological:     General: No focal deficit present.     Mental Status: She is alert and oriented to person, place, and time. Mental status is at baseline.     Cranial Nerves: No cranial nerve deficit, dysarthria or facial asymmetry.     Sensory: No sensory deficit.     Motor: Tremor present. No weakness or abnormal muscle tone.     Coordination: Romberg sign negative. Coordination normal.     Gait: Gait normal.  Psychiatric:        Mood and Affect: Mood normal.        Behavior: Behavior normal.        Thought Content: Thought content normal.        Judgment: Judgment normal.         Assessment & Plan:  Benign essential HTN Urine metanephrine is still pending however workup for renal artery stenosis and  hyperaldosteronism have been negative.  Blood pressure is still elevated at home between 150 and 200 systolic most days.  Therefore continue carvedilol 6.25 mg twice daily, amlodipine 10 mg daily, Avapro 300 mg daily, but start clonidine patch 0.3 mg per 24 hours weekly.  I am hoping that the consistent administration on a healthy culture persist in 1 week and if persistently elevated add hydrochlorothiazide

## 2023-08-12 ENCOUNTER — Telehealth: Payer: Self-pay | Admitting: *Deleted

## 2023-08-12 LAB — METANEPHRINES, URINE, 24 HOUR
METANEPHRINE: 97 ug/(24.h) (ref 90–315)
METANEPHRINES, TOTAL: 378 ug/(24.h) (ref 224–832)
NORMETANEPHRINE: 281 ug/(24.h) (ref 122–676)
Total Volume: 2400 mL

## 2023-08-12 NOTE — Patient Outreach (Signed)
 Care Management  Transitions of Care Program Transitions of Care Post-discharge week 2- 3rd attempt  08/12/2023 Name: [name] MRN: [id number] DOB: 1955-09-25  Subjective: [name] is a 68 y.o. year old female who is a primary care patient of [name], MD. The Care Management team was unable to reach the patient by phone to assess and address transitions of care needs.   Plan: No further outreach attempts will be made at this time.  We have been unable to reach the patient.  Irving Shows [facility] ([facility]), BSN RN Care Manager/ Transition of Care [place]/ [facility] [phone]

## 2023-08-14 NOTE — Progress Notes (Unsigned)
 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 07/11/2023 was stable, to slightly decreased size of the infarct of the posterior inferior right occipital lobe, without expanding hemorrhage She was on baby aspirin, Plavix 75 mg and Eliquis daily prior to admission, she was restarted Plavix only since after IVC filter placement (temporary) She may need hematological consultation prior to IVC filter removal, to rule out any underlying hematological disease-clotting disorder, and in an effort to prevent any further cerebral and vascular events    MRI of the brain 07/11/2023 acute infarction in the right PCA territory affecting right occipital lobe, posteromedial temporal lobe and portion of the right hippocampus. No evidence of hemorrhage or mass effect. 2. Low moderate chronic small-vessel ischemic changes of the cerebral hemispheric white matter.     History of DVT LE Doppler 3/11 showed hyperacute DVT involving CFV and PE as the, superficial vein thrombosis involving the left great saphenous vein of the SFJ She is received Eliquis with loading dose 10 mg twice daily for 7 days and then DAPT continued Then she was off of Eliquis due to epistaxis and hemorrhagic conversion IVC filter was completed on 07/26/2023 by vascular surgery As mentioned above, she is back on Plavix 75 mg only  No further events to date   Headaches, concern for uncontrollable hypertension as the culprit.    Patient has a history of tension type headaches, however, her  status was complicated by a stroke as mentioned above, for which her blood pressure is being closely monitored.     For abortive therapy May use Tylenol as needed.    Limit use of pain relievers to no more than 2 days out of week to prevent risk of rebound or medication-overuse headache. For preventative medicine: Patient was unable to tolerate nortritpyline due to worsening BP.   Keep headache diary  Exercise, hydration, caffeine cessation, sleep hygiene, monitor for and avoid triggers Continue magnesium citrate 400mg  daily, riboflavin 400mg  daily, and coenzyme Q10 100mg  three times daily Strong monitor of Blood pressure with current med regimen  as per  Cards and PCP Continue Phenergan 25 mg every 8 hours as needed for nausea as per PCP. Follow up in 6 months   Follow-up with [facility]VS for carotid artery stenosis    Essential Tremors No significant changes from prior visit, denies any issues  She is not on metoprolol.       History of Present Illness:  [name] is a 68 y.o. R-handed female with a history of hypertension, hyperlipidemia, anxiety, history of abdominal aortic aneurysm, prediabetes, history of tension headaches, recent PCA infarct, presenting today*recent presentation to the ED with  B and T posterior headaches and L nosebleed.  In the evening prior to presentation to the hospital, she experienced left nose bleed with blood clots.  Initially the blood was bright, but then was dark.  She had bleeding in 2 events within 2 hours.   She was on Eliquis twice daily at the time for about 4 days.  The ER, CT of the head revealed hemorrhagic transformation without mass effect or midline shift  of the right PCA infarct, new from prior imaging on 07/12/2023.  It was felt that the hemorrhagic transformation was likely due to coagulopathy from antiplatelet and anticoagulation and the bleed was rather confluent petechial type, no frank malignant intracranial hemorrhage, and given her DVT and  prothrombin states, IVC filter was placed temporarily, and discontinued  Eliquis therapy and was monitored closely.     Denies vertigo dizziness or vision changes. Denies headaches today.  Denies dysarthria or dysphagia. No confusion or seizures.  She had a couple of episodes of nonspecific chest pain with negative workup, denies shortness.  Denies any fever or chills, or night sweats. No tobacco. No new meds or hormonal supplements.  Denies any recent long distance trips.  Most recent procedure was IVC filter placement during the hospitalization in March 2025.  No sick contacts. No new stressors present in personal life.  Patient is compliant with his medications. .She is not as active as prior in view of recent hospitalizations.   As for her headaches, she only had a couple of episodes since her last visit, treated with Tylenol.  As before, her headache is accompanied by some bright lights, mostly on the left side, and in both temporal regions, accompanied with some nausea, quality 4 out of 5, pulsating. On the left, her quadrantanopsia is improving, she reports that she can see better than prior.  Initial visit 09/2021   Patient is a very pleasant 68 year old woman f initially seen at [facility] neurology for evaluation of essential tremor, currently on metoprolol,  history of hypertension, hyperlipidemia, anxiety, and a history of abdominal aortic aneurysm and prediabetes.  She recently had been seen at the ED on 10/02/2021 with complaints of significant headaches, "jumbled words ", and slight "left hand heaviness with the headaches ".  his last month, her blood pressure has been significantly uncontrolled reaching up to 200s.  She has some floaters in her right eye, otherwise she denies any visual complaints.  She has chronic bilateral foot tingling, but there is no new areas of numbness.  CT angio of the head on 09/09/2021 (for decreased vision)  prior to this presentation was remarkable for 70% atheromatosis  narrowing of the right CC bifurcation and right cavernous ICA, as well as 50% stenosis of the paraclinoid right ICA.  Also seen, 45% narrowing of the right vertebral origin.  Pending on these findings, an MRI of the brain without contrast was performed, negative for stroke or other abnormality is.  It was suspected that these symptoms were due to hypertensive emergency, which had resolved at the time of neurological evaluation.  She was discharged on gabapentin 300 mg twice daily, but the patient reduced it to 300 mg at night, because of increased sleepiness.  Patient is on statin, metoprolol, amlodipine, and clonidine as needed as well. Denies any history of TIA. Denies vertigo dizziness or vision changes. Denies dysphagia. No confusion Denies any fever or chills, or night sweats. No tobacco. No new meds or hormonal supplements. Does take a regular ASA a day, with no other antiplatelets or anticoagulants.Denies any recent long distance trips or recent surgeries. No sick contacts. No new stressors present in personal life.  Patient is compliant with his medications.Patient is active. She is here in follow-up.  She is alone during this visit.   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    On 07/11/2023 the patient was admitted for PTCA of bilateral common femoral arteries and stent to bilateral common iliac arteries  and one of the procedures.  At the time, she had been placed on conscious sedation.  While she was interactive with the staff postprocedure, she complained of bilateral visual blurring on the left temporal field with fluctuating blood pressure between 120s and 140s to 90,.  Code stroke was called, neurology evaluated patient.   CTA showed right ICA bulb 60%, and severe stenosis of siphon .  She was admitted to the hospital for further evaluation, neurology saw the patient in consultation.  Symptoms were felt most likely to have been secondary to recovery from  sedation with transient hypotension possibly contributing. MRI of the brain was obtained and the findings were consistent with acute right PCA territory infarction affecting the right occipital lobe, posterior medial temporal lobe and portion of the right hippocampus.  There was no evidence of hemorrhage or mass effect.  There was low moderate chronic small vessel disease.   2D echo EF 55 to 60% LDL was 75 A1c was 6.0 VTE prophylaxis: She was placed on 5000 units of heparin every 8 hours, then followed by aspirin 81 mg daily (she was on baby aspirin prior to admission), and added Plavix 75 mg daily  Continue aspirin and Plavix, good control of her blood pressure, continue lipid medicines, and follow with cardiology, cardiovascular surgery   CTA 09/09/2021 personally reviewed, agree with radiology: 1. No emergent finding. 2. At least 70% atheromatous narrowing of the right common carotid bifurcation and right cavernous ICA. 50% stenosis of the paraclinoid right ICA. 3. 45% narrowing at the right vertebral origin. 4. Mild intracranial branch atherosclerosis.   Out-side paper records, electronic medical record, and images have been reviewed where available and summarized as:   Lab Results  Component Value Date   HGBA1C 6.0 (H) 07/12/2023   Lab Results  Component Value Date   VITAMINB12 >2,000 (H) 01/20/2021   Lab Results  Component Value Date   TSH 1.84 10/29/2019   Lab Results  Component Value Date   ESRSEDRATE 3 03/16/2023    Past Medical History:  Diagnosis Date   Anxiety    Carotid stenosis, asymptomatic, bilateral    Cataract    Chronic left shoulder pain    Headache    Hiatal hernia    small   Hyperlipidemia    Hypertension    Osteoporosis    Pre-diabetes    Schatzki's ring    Seasonal allergies    Stroke [facility])    Tremor     Past Surgical History:  Procedure Laterality Date   ABDOMINAL AORTOGRAM W/LOWER EXTREMITY N/A 07/11/2023   Procedure: ABDOMINAL AORTOGRAM  W/LOWER EXTREMITY;  Surgeon: Maeola Harman, MD;  Location: [facility] INVASIVE CV LAB;  Service: Cardiovascular;  Laterality: N/A;   CARDIAC CATHETERIZATION  2005   "Ms. Cirelli has essentially normal coronary arteries and normal left ventricular function.  She will be treated empirically with anti-reflux measures"   CATARACT EXTRACTION, BILATERAL     CHOLECYSTECTOMY     COLONOSCOPY    06/16/2004   OZH:[id number] rectum/Diminutive polyps of the splenic flexure and the cecum/The remainder of the colonic mucosa appeared normal pathology (hyperplastic polyps).   COLONOSCOPY N/A 08/07/2015   Dr. Jena Gauss: 6 mm descending colon tubular adenoma, multiple medium-mouthed diverticula in sigmoid colon. surveillance 2022   COLONOSCOPY N/A 04/15/2021   Procedure: COLONOSCOPY;  Surgeon: Corbin Ade, MD;  Location: [facility] ENDO SUITE;  Service: Endoscopy;  Laterality: N/A;  9:30am   ESOPHAGOGASTRODUODENOSCOPY  09/11/2001   ION:[id number] ring otherwise normal esophagus/Normal stomach/Normal  D1 and D2 status post passage of a 26 French Maloney dilator   ESOPHAGOGASTRODUODENOSCOPY N/A 11/11/2017   Procedure: ESOPHAGOGASTRODUODENOSCOPY (EGD);  Surgeon: Corbin Ade, MD; mild Schatzki ring s/p dilation and mild erosive gastropathy.   ESOPHAGOGASTRODUODENOSCOPY (EGD) WITH ESOPHAGEAL DILATION N/A 01/25/2013   Dr. Jena Gauss- schatzki's ring- 54 F dilation. small hiatal hernia   HYSTEROSCOPY WITH D & C N/A 03/06/2019   Procedure: DILATATION AND CURETTAGE /HYSTEROSCOPY;  Surgeon: Tilda Burrow, MD;  Location: [facility] ORS;  Service: Gynecology;  Laterality: N/A;   MALONEY DILATION N/A 11/11/2017   Procedure: Elease Hashimoto DILATION;  Surgeon: Corbin Ade, MD;  Location: [facility];  Service: Endoscopy;  Laterality: N/A;   PERIPHERAL VASCULAR BALLOON ANGIOPLASTY Left 07/11/2023   Procedure: PERIPHERAL VASCULAR BALLOON ANGIOPLASTY;  Surgeon: Maeola Harman, MD;  Location: [facility] INVASIVE CV LAB;  Service: Cardiovascular;   Laterality: Left;  External Iliac   PERIPHERAL VASCULAR INTERVENTION Bilateral 07/11/2023   Procedure: PERIPHERAL VASCULAR INTERVENTION;  Surgeon: Maeola Harman, MD;  Location: [facility] INVASIVE CV LAB;  Service: Cardiovascular;  Laterality: Bilateral;  Bilateral Common Iliac; L External Iliac   POLYPECTOMY N/A 03/06/2019   Procedure: POLYPECTOMY ( REMOVAL OF ENDOMETRIAL POLYP);  Surgeon: Tilda Burrow, MD;  Location: [facility] ORS;  Service: Gynecology;  Laterality: N/A;   POLYPECTOMY  04/15/2021   Procedure: POLYPECTOMY;  Surgeon: Corbin Ade, MD;  Location: [facility];  Service: Endoscopy;;   TUBAL LIGATION     VENA CAVA UMBRELLA NECK APPROACH N/A 07/26/2023   Procedure: INSERTION, UMBRELLA FILTER, INFERIOR VENA CAVA FEMORAL ARTERY;  Surgeon: Maeola Harman, MD;  Location[facility] OR;  Service: Vascular;  Laterality: N/A;     Medications:  Outpatient Encounter Medications as of 08/15/2023  Medication Sig Note   acetaminophen (TYLENOL) 500 MG tablet Take 500 mg by mouth every 6 (six) hours as needed for moderate pain or headache.    alendronate (FOSAMAX) 70 MG tablet TAKE 1 TABLET(70 MG) BY MOUTH EVERY 7 DAYS WITH A FULL GLASS OF WATER AND ON AN EMPTY STOMACH 07/26/2023: Take on Sundays   amLODipine (NORVASC) 10 MG tablet Take 10 mg by mouth at bedtime.    carvedilol (COREG) 3.125 MG tablet Take 2 tablets (6.25 mg total) by mouth 2 (two) times daily with a meal.    Cholecalciferol (VITAMIN D) 50 MCG (2000 UT) CAPS Take 2,000 Units by mouth daily.    cloNIDine (CATAPRES - DOSED IN MG/24 HR) 0.3 mg/24hr patch Place 1 patch (0.3 mg total) onto the skin once a week.    cloNIDine (CATAPRES) 0.1 MG tablet Take 1 tablet (0.1 mg total) by mouth 3 (three) times daily as needed (180/100).    clopidogrel (PLAVIX) 75 MG tablet Take 1 tablet (75 mg total) by mouth daily.    Coenzyme Q10 (COQ10) 100 MG CAPS Take 100 mg by mouth daily.    CRANBERRY PO Take 1 each by mouth 2 (two) times a week.  Chewables    Cyanocobalamin (B-12) 2500 MCG TABS Take 2,500 mcg by mouth daily.    dexlansoprazole (DEXILANT) 60 MG capsule Take 1 capsule (60 mg total) by mouth daily.    EPINEPHrine 0.3 mg/0.3 mL IJ SOAJ injection Inject 0.3 mg into the muscle as needed for anaphylaxis. 07/11/2023: Never used as of 07/11/23.   fluticasone (FLONASE) 50 MCG/ACT nasal spray Place 2 sprays into both nostrils daily as needed for allergies.    hydroquinone 4 % cream Apply 1 application topically 2 (two) times  daily as needed (dark spots).    ipratropium (ATROVENT) 0.03 % nasal spray INSTILL 2 SPRAYS IN EACH NOSTRIL EVERY 12 HOURS (Patient taking differently: Place 2 sprays into both nostrils 2 (two) times daily as needed for rhinitis.)    irbesartan (AVAPRO) 300 MG tablet Take 1 tablet (300 mg total) by mouth daily.    loratadine (CLARITIN) 10 MG tablet Take 10 mg by mouth daily as needed for allergies.    MAGNESIUM PO Take 330 mg by mouth daily.    ondansetron (ZOFRAN) 4 MG tablet TAKE 1 TABLET(4 MG) BY MOUTH EVERY 8 HOURS AS NEEDED FOR NAUSEA OR VOMITING 07/26/2023: Still has on hand    rosuvastatin (CRESTOR) 20 MG tablet Take 1 tablet (20 mg total) by mouth daily.    sodium chloride (OCEAN) 0.65 % SOLN nasal spray Place 1 spray into both nostrils as needed for congestion.    triamcinolone cream (KENALOG) 0.1 % Apply 1 Application topically 2 (two) times daily. (Patient taking differently: Apply 1 Application topically 2 (two) times daily as needed (irritation).)    vitamin E 400 UNIT capsule Take 400 Units by mouth 3 (three) times a week. 07/26/2023: No specific days    zinc gluconate 50 MG tablet Take 50 mg by mouth 3 (three) times a week. 07/26/2023: No specific days    Facility-Administered Encounter Medications as of 08/15/2023  Medication   nitroGLYCERIN (NITROSTAT) SL tablet 0.4 mg    Allergies:  Allergies  Allergen Reactions   Ciprofloxacin Shortness Of Breath   Latex Other (See Comments)    Burns skin    Vitamin C [Bioflavonoid Products] Itching    Family History: Family History  Problem Relation Age of Onset   Diabetes Other    Diabetes Mother    Heart disease Mother    Heart disease Father    Diabetes Sister    Diabetes Brother    Hypertension Brother    Diabetes Daughter    Hypertension Maternal Grandmother    Stroke Maternal Grandfather    Early death Paternal Grandfather    Colon cancer Neg Hx    Liver disease Neg Hx     Social History: Social History   Tobacco Use   Smoking status: Former    Current packs/day: 0.00    Average packs/day: 0.3 packs/day for 35.0 years (8.8 ttl pk-yrs)    Types: Cigarettes    Start date: 12/31/1977    Quit date: 12/31/2012    Years since quitting: 10.6   Smokeless tobacco: Never  Vaping Use   Vaping status: Never Used  Substance Use Topics   Alcohol use: Not Currently   Drug use: No   Social History   Social History Narrative   Eats all food groups.    Wears seatbelt.    Has 3 children.   Prior smoker.   Attends church.    Used to work in Designer, fashion/clothing.    Drives.   Enjoys going out with family.    Right handed   One story home    Vital Signs:  There were no vitals taken for this visit.   General Medical Exam:   General:  Well appearing, comfortable.   Eyes/ENT: see cranial nerve examination.   Neck: Right lateral carotid bruits. Respiratory:  Clear to auscultation, good air entry bilaterally.   Cardiac:  Regular rate and rhythm, 1 out of 6 systolic murmur.   Extremities:  No deformities, edema, or skin discoloration.   Skin:  No rashes or lesions.  Neurological Exam: MENTAL STATUS including orientation to time, place, person, recent and remote memory, attention span and concentration, language, and fund of knowledge is normal.  Speech is not dysarthric.  CRANIAL NERVES: II:  No visual field defects.  Unremarkable fundi.   III-IV-VI: Pupils equal round and reactive to light.  On the left, her quadrantanopsia has  improved.  Normal conjugate, extra-ocular eye movements in all directions of gaze.  No nystagmus.  No ptosis.   V:  Normal facial sensation.    VII:  Normal facial symmetry and movements.   VIII:  Normal hearing and vestibular function.   IX-X:  Normal palatal movement.   XI:  Normal shoulder shrug and head rotation.   XII:  Normal tongue strength and range of motion, no deviation or fasciculation.  MOTOR:  No atrophy, fasciculations, greater than right very mild tremor worse on intention, no head tremor.  Abnormal movements.  No pronator drift.    SENSORY:  Normal and symmetric perception of light touch, pinprick, vibration, and proprioception.  Romberg's sign absent.   COORDINATION/GAIT: Normal finger-to- nose-finger and heel-to-shin.  Intact rapid alternating movements bilaterally.  Able to rise from a chair without using arms.  Gait narrow based and stable. Tandem and stressed gait intact.     Total time spent:  40 mins   Thank you for allowing me to participate in patient's care.  If I can answer any additional questions, I would be pleased to do so.    Sincerely,   Marlowe Kays, PA-C

## 2023-08-15 ENCOUNTER — Ambulatory Visit: Admitting: Physician Assistant

## 2023-08-15 ENCOUNTER — Encounter: Payer: Self-pay | Admitting: Physician Assistant

## 2023-08-15 VITALS — BP 118/59 | HR 75 | Resp 20 | Ht 67.0 in | Wt 158.0 lb

## 2023-08-15 DIAGNOSIS — I639 Cerebral infarction, unspecified: Secondary | ICD-10-CM | POA: Diagnosis not present

## 2023-08-15 DIAGNOSIS — G250 Essential tremor: Secondary | ICD-10-CM

## 2023-08-15 DIAGNOSIS — G44229 Chronic tension-type headache, not intractable: Secondary | ICD-10-CM | POA: Diagnosis not present

## 2023-08-15 NOTE — Patient Instructions (Addendum)
  Follow up in 6 months  Continue the CoQ, B6 and Mag and tylenol  Recommend good stress management and sleep  Stay hydrated  Follow with cardiology  and [facility]  for murmurs, carotid bruits, blood pressure. Continue  Plavix   Limit use of pain relievers to no more than 2 days out of the week.  These medications include acetaminophen, NSAIDs (ibuprofen/Advil/Motrin, naproxen/Aleve, triptans (Imitrex/sumatriptan), Excedrin, and narcotics.  This will help reduce risk of rebound headaches. Be aware of common food triggers:  - Caffeine:  coffee, black tea, cola, Mt. Dew  - Chocolate  - Dairy:  aged cheeses (brie, blue, cheddar, gouda, [place], provolone, [place], Swiss, etc), chocolate milk, buttermilk, sour cream, limit eggs and yogurt  - Nuts, peanut butter  - Alcohol  - Cereals/grains:  FRESH breads (fresh bagels, sourdough, doughnuts), yeast productions  - Processed/canned/aged/cured meats (pre-packaged deli meats, hotdogs)  - MSG/glutamate:  soy sauce, flavor enhancer, pickled/preserved/marinated foods  - Sweeteners:  aspartame (Equal, Nutrasweet).  Sugar and Splenda are okay  - Vegetables:  legumes (lima beans, lentils, snow peas, fava beans, pinto peans, peas, garbanzo beans), sauerkraut, onions, olives, pickles  - Fruit:  avocados, bananas, citrus fruit (orange, lemon, grapefruit), mango  - Other:  Frozen meals, macaroni and cheese Routine exercise Stay adequately hydrated (aim for 64 oz water daily) Keep headache diary Maintain proper stress management Maintain proper sleep hygiene Do not skip meals Consider supplements:  magnesium citrate 400mg  daily, riboflavin 400mg  daily, coenzyme Q10 100mg  three times daily. 50 25   MRI will be at [facility] [phone]

## 2023-08-19 ENCOUNTER — Telehealth: Payer: Self-pay

## 2023-08-19 ENCOUNTER — Other Ambulatory Visit: Payer: Self-pay | Admitting: Family Medicine

## 2023-08-19 MED ORDER — HYDROCHLOROTHIAZIDE 25 MG PO TABS: 25.0000 mg | ORAL_TABLET | Freq: Every day | ORAL | 3 refills | Status: DC

## 2023-08-19 NOTE — Telephone Encounter (Signed)
 Copied from CRM [id number]. Topic: Clinical - Medical Advice >> Aug 19, 2023  1:30 PM Benay Spice S wrote: Reason for CRM: Patient called to report that her BP has been running to 185/78 in the afternoon with her being on the patch. She reports that the highest it's got is 194/79 and the lowest is 172/68. Please contact patient for additional information at [phone].  Spoke with patient, pt c/o headache to L side of head. Occasional dizziness and blurred vision. No chest pain per pt. Does have some tingling to left arm and hand. Thanks.

## 2023-08-19 NOTE — Progress Notes (Signed)
Hydrochlorothiazide

## 2023-08-22 ENCOUNTER — Telehealth: Payer: Self-pay

## 2023-08-22 ENCOUNTER — Ambulatory Visit (INDEPENDENT_AMBULATORY_CARE_PROVIDER_SITE_OTHER)
Admit: 2023-08-22 | Discharge: 2023-08-22 | Disposition: A | Discharge status: Home / Self Care | Attending: Surgery | Admitting: Surgery

## 2023-08-22 ENCOUNTER — Ambulatory Visit (HOSPITAL_COMMUNITY)
Admission: RE | Admit: 2023-08-22 | Discharge: 2023-08-22 | Disposition: A | Discharge status: Home / Self Care | Source: Ambulatory Visit | Attending: Surgery | Admitting: Surgery

## 2023-08-22 DIAGNOSIS — I70221 Atherosclerosis of native arteries of extremities with rest pain, right leg: Secondary | ICD-10-CM | POA: Diagnosis not present

## 2023-08-22 LAB — VAS US ABI WITH/WO TBI
Left ABI: 0.97
Right ABI: 0.73

## 2023-08-22 NOTE — Telephone Encounter (Signed)
 Copied from CRM [id number]. Topic: Clinical - Medical Advice >> Aug 22, 2023 11:27 AM [name] wrote: Reason for CRM: Patient has been experiencing a drastic drop in blood pressure since being on the new patch//Please call patient to advise on what to do moving forward

## 2023-08-23 ENCOUNTER — Ambulatory Visit: Admitting: Physician Assistant

## 2023-08-31 ENCOUNTER — Encounter: Payer: Self-pay | Admitting: Vascular Surgery

## 2023-08-31 ENCOUNTER — Ambulatory Visit (INDEPENDENT_AMBULATORY_CARE_PROVIDER_SITE_OTHER): Admitting: Vascular Surgery

## 2023-08-31 VITALS — BP 111/67 | HR 68 | Temp 97.9°F | Ht 67.0 in | Wt 155.0 lb

## 2023-08-31 DIAGNOSIS — Z95828 Presence of other vascular implants and grafts: Secondary | ICD-10-CM | POA: Diagnosis not present

## 2023-08-31 DIAGNOSIS — I70221 Atherosclerosis of native arteries of extremities with rest pain, right leg: Secondary | ICD-10-CM | POA: Diagnosis not present

## 2023-08-31 NOTE — Progress Notes (Signed)
 Patient ID: [name], female   DOB: 05-31-1955, 68 y.o.   MRN: [id number]  Reason for Consult: Routine Post Op   Referred by Austine Lefort, MD  Subjective:     HPI:  [name] is a 68 y.o. female status post bilateral common iliac artery stenting for rest pain right greater than left.  Unfortunately from the procedure she had a stroke which affected her vision particularly on the left but this is now resolving.  She subsequently was anticoagulated for DVT and had a small intracranial hemorrhage for which she underwent IVC filter.  She states she is now walking without limitation and only has residual numbness in the right great toe and again the vision is improving and her headaches are also improving.  She is here today alone but her daughter is on the phone.  She continues on Plavix  and anticoagulation for the DVT has been held.  Past Medical History:  Diagnosis Date   Anxiety    Carotid stenosis, asymptomatic, bilateral    Cataract    Chronic left shoulder pain    Headache    Hiatal hernia    small   Hyperlipidemia    Hypertension    Osteoporosis    Pre-diabetes    Schatzki's ring    Seasonal allergies    Stroke [facility])    Tremor    Family History  Problem Relation Age of Onset   Diabetes Other    Diabetes Mother    Heart disease Mother    Heart disease Father    Diabetes Sister    Diabetes Brother    Hypertension Brother    Diabetes Daughter    Hypertension Maternal Grandmother    Stroke Maternal Grandfather    Early death Paternal Grandfather    Colon cancer Neg Hx    Liver disease Neg Hx    Past Surgical History:  Procedure Laterality Date   ABDOMINAL AORTOGRAM W/LOWER EXTREMITY N/A 07/11/2023   Procedure: ABDOMINAL AORTOGRAM W/LOWER EXTREMITY;  Surgeon: Adine Hoof, MD;  Location: [facility] INVASIVE CV LAB;  Service: Cardiovascular;  Laterality: N/A;   CARDIAC CATHETERIZATION  2005   "Ms. Najera has essentially normal coronary  arteries and normal left ventricular function.  She will be treated empirically with anti-reflux measures"   CATARACT EXTRACTION, BILATERAL     CHOLECYSTECTOMY     COLONOSCOPY    06/16/2004   WUJ:WJXBJY rectum/Diminutive polyps of the splenic flexure and the cecum/The remainder of the colonic mucosa appeared normal pathology (hyperplastic polyps).   COLONOSCOPY N/A 08/07/2015   Dr. Riley Cheadle: 6 mm descending colon tubular adenoma, multiple medium-mouthed diverticula in sigmoid colon. surveillance 2022   COLONOSCOPY N/A 04/15/2021   Procedure: COLONOSCOPY;  Surgeon: Suzette Espy, MD;  Location: [facility];  Service: Endoscopy;  Laterality: N/A;  9:30am   ESOPHAGOGASTRODUODENOSCOPY  09/11/2001   NWG:NFAOZHYQM ring otherwise normal esophagus/Normal stomach/Normal D1 and D2 status post passage of a 56 French Maloney dilator   ESOPHAGOGASTRODUODENOSCOPY N/A 11/11/2017   Procedure: ESOPHAGOGASTRODUODENOSCOPY (EGD);  Surgeon: Suzette Espy, MD; mild Schatzki ring s/p dilation and mild erosive gastropathy.   ESOPHAGOGASTRODUODENOSCOPY (EGD) WITH ESOPHAGEAL DILATION N/A 01/25/2013   Dr. Riley Cheadle- schatzki's ring- 54 F dilation. small hiatal hernia   HYSTEROSCOPY WITH D & C N/A 03/06/2019   Procedure: DILATATION AND CURETTAGE /HYSTEROSCOPY;  Surgeon: Albino Hum, MD;  Location: [facility] ORS;  Service: Gynecology;  Laterality: N/A;   MALONEY DILATION N/A 11/11/2017   Procedure: MALONEY DILATION;  Surgeon: Suzette Espy, MD;  Location: [facility];  Service: Endoscopy;  Laterality: N/A;   PERIPHERAL VASCULAR BALLOON ANGIOPLASTY Left 07/11/2023   Procedure: PERIPHERAL VASCULAR BALLOON ANGIOPLASTY;  Surgeon: Adine Hoof, MD;  Location: [facility] INVASIVE CV LAB;  Service: Cardiovascular;  Laterality: Left;  External Iliac   PERIPHERAL VASCULAR INTERVENTION Bilateral 07/11/2023   Procedure: PERIPHERAL VASCULAR INTERVENTION;  Surgeon: Adine Hoof, MD;  Location: [facility] INVASIVE CV LAB;  Service:  Cardiovascular;  Laterality: Bilateral;  Bilateral Common Iliac; L External Iliac   POLYPECTOMY N/A 03/06/2019   Procedure: POLYPECTOMY ( REMOVAL OF ENDOMETRIAL POLYP);  Surgeon: Albino Hum, MD;  Location: [facility];  Service: Gynecology;  Laterality: N/A;   POLYPECTOMY  04/15/2021   Procedure: POLYPECTOMY;  Surgeon: Suzette Espy, MD;  Location: [facility];  Service: Endoscopy;;   TUBAL LIGATION     VENA CAVA UMBRELLA NECK APPROACH N/A 07/26/2023   Procedure: INSERTION, UMBRELLA FILTER, INFERIOR VENA CAVA FEMORAL ARTERY;  Surgeon: Adine Hoof, MD;  Location: [facility] OR;  Service: Vascular;  Laterality: N/A;    Short Social History:  Social History   Tobacco Use   Smoking status: Former    Current packs/day: 0.00    Average packs/day: 0.3 packs/day for 35.0 years (8.8 ttl pk-yrs)    Types: Cigarettes    Start date: 12/31/1977    Quit date: 12/31/2012    Years since quitting: 10.6   Smokeless tobacco: Never  Substance Use Topics   Alcohol use: Not Currently    Allergies  Allergen Reactions   Ciprofloxacin Shortness Of Breath   Latex Other (See Comments)    Burns skin   Vitamin C [Bioflavonoid Products] Itching    Current Outpatient Medications  Medication Sig Dispense Refill   acetaminophen  (TYLENOL ) 500 MG tablet Take 500 mg by mouth every 6 (six) hours as needed for moderate pain or headache.     alendronate  (FOSAMAX ) 70 MG tablet TAKE 1 TABLET(70 MG) BY MOUTH EVERY 7 DAYS WITH A FULL GLASS OF WATER  AND ON AN EMPTY STOMACH 4 tablet 11   amLODipine  (NORVASC ) 10 MG tablet Take 10 mg by mouth at bedtime.     carvedilol  (COREG ) 3.125 MG tablet Take 2 tablets (6.25 mg total) by mouth 2 (two) times daily with a meal. 180 tablet 0   Cholecalciferol (VITAMIN D) 50 MCG (2000 UT) CAPS Take 2,000 Units by mouth daily.     cloNIDine  (CATAPRES  - DOSED IN MG/24 HR) 0.3 mg/24hr patch Place 1 patch (0.3 mg total) onto the skin once a week. 4 patch 12   cloNIDine  (CATAPRES ) 0.1  MG tablet Take 1 tablet (0.1 mg total) by mouth 3 (three) times daily as needed (180/100). 90 tablet 3   clopidogrel  (PLAVIX ) 75 MG tablet Take 1 tablet (75 mg total) by mouth daily. 90 tablet 0   Coenzyme Q10 (COQ10) 100 MG CAPS Take 100 mg by mouth daily.     CRANBERRY PO Take 1 each by mouth 2 (two) times a week. Chewables     Cyanocobalamin  (B-12) 2500 MCG TABS Take 2,500 mcg by mouth daily.     dexlansoprazole  (DEXILANT ) 60 MG capsule Take 1 capsule (60 mg total) by mouth daily. 30 capsule 11   EPINEPHrine  0.3 mg/0.3 mL IJ SOAJ injection Inject 0.3 mg into the muscle as needed for anaphylaxis. 1 each 1   fluticasone  (FLONASE ) 50 MCG/ACT nasal spray Place 2 sprays into both nostrils daily as needed for allergies.  hydrochlorothiazide  (HYDRODIURIL ) 25 MG tablet Take 1 tablet (25 mg total) by mouth daily. 90 tablet 3   hydroquinone 4 % cream Apply 1 application topically 2 (two) times daily as needed (dark spots).     ipratropium (ATROVENT ) 0.03 % nasal spray INSTILL 2 SPRAYS IN EACH NOSTRIL EVERY 12 HOURS (Patient taking differently: Place 2 sprays into both nostrils 2 (two) times daily as needed for rhinitis.) 30 mL 12   irbesartan  (AVAPRO ) 300 MG tablet Take 1 tablet (300 mg total) by mouth daily. 90 tablet 3   loratadine  (CLARITIN ) 10 MG tablet Take 10 mg by mouth daily as needed for allergies.     MAGNESIUM  PO Take 330 mg by mouth daily.     ondansetron  (ZOFRAN ) 4 MG tablet TAKE 1 TABLET(4 MG) BY MOUTH EVERY 8 HOURS AS NEEDED FOR NAUSEA OR VOMITING 30 tablet 0   rosuvastatin  (CRESTOR ) 20 MG tablet Take 1 tablet (20 mg total) by mouth daily. 90 tablet 3   sodium chloride  (OCEAN) 0.65 % SOLN nasal spray Place 1 spray into both nostrils as needed for congestion.     triamcinolone  cream (KENALOG ) 0.1 % Apply 1 Application topically 2 (two) times daily. (Patient taking differently: Apply 1 Application topically 2 (two) times daily as needed (irritation).) 30 g 0   vitamin E 400 UNIT capsule  Take 400 Units by mouth 3 (three) times a week.     zinc gluconate 50 MG tablet Take 50 mg by mouth 3 (three) times a week.     Current Facility-Administered Medications  Medication Dose Route Frequency Provider Last Rate Last Admin   nitroGLYCERIN  (NITROSTAT ) SL tablet 0.4 mg  0.4 mg Sublingual Once         Review of Systems  Constitutional:  Constitutional negative. HENT: HENT negative.  Eyes: Positive for visual disturbance.   Respiratory: Respiratory negative.  Cardiovascular: Cardiovascular negative.  GI: Gastrointestinal negative.  Musculoskeletal: Musculoskeletal negative.  Skin: Skin negative.  Neurological: Positive for headaches and numbness.  Hematologic: Hematologic/lymphatic negative.  Psychiatric: Psychiatric negative.        Objective:  Objective   Vitals:   08/31/23 0945  BP: 111/67  Pulse: 68  Temp: 97.9 F (36.6 C)  SpO2: 98%  Weight: 155 lb (70.3 kg)  Height: 5\' 7"  (1.702 m)   Body mass index is 24.28 kg/m.  Physical Exam HENT:     Head: Normocephalic.     Nose:     Comments: Wearing a mask    Mouth/Throat:     Mouth: Mucous membranes are moist.  Eyes:     Pupils: Pupils are equal, round, and reactive to light.  Cardiovascular:     Rate and Rhythm: Normal rate.     Pulses:          Popliteal pulses are 0 on the right side and 2+ on the left side.       Dorsalis pedis pulses are detected w/ Doppler on the right side and 2+ on the left side.       Posterior tibial pulses are detected w/ Doppler on the right side and detected w/ Doppler on the left side.  Pulmonary:     Effort: Pulmonary effort is normal.  Abdominal:     General: Abdomen is flat.     Palpations: Abdomen is soft.  Musculoskeletal:     Right lower leg: No edema.     Left lower leg: No edema.  Skin:    General: Skin is warm.  Capillary Refill: Capillary refill takes less than 2 seconds.  Neurological:     General: No focal deficit present.     Mental Status: She is  alert.  Psychiatric:        Mood and Affect: Mood normal.        Thought Content: Thought content normal.     Data: Abdominal Aorta Findings:  +-------------+-------+----------+----------+--------+--------+--------+  Location    AP (cm)Trans (cm)PSV (cm/s)WaveformThrombusComments  +-------------+-------+----------+----------+--------+--------+--------+  RT EIA Prox                   161                                 +-------------+-------+----------+----------+--------+--------+--------+  RT EIA Mid                    174                                 +-------------+-------+----------+----------+--------+--------+--------+  RT EIA Distal                 136                                 +-------------+-------+----------+----------+--------+--------+--------+     Right Stent(s):  +---------------+--------+--------+--------+--------+  CIA           PSV cm/sStenosisWaveformComments  +---------------+--------+--------+--------+--------+  Prox to Stent  84              biphasic          +---------------+--------+--------+--------+--------+  Proximal Stent 108             biphasic          +---------------+--------+--------+--------+--------+  Mid Stent      141             biphasic          +---------------+--------+--------+--------+--------+  Distal Stent   141             biphasic          +---------------+--------+--------+--------+--------+  Distal to [id number]             biphasic          +---------------+--------+--------+--------+--------+           Left Stent(s):  +---------------+--------+--------+--------+--------+  CIA           PSV cm/sStenosisWaveformComments  +---------------+--------+--------+--------+--------+  Prox to Stent  100             biphasic          +---------------+--------+--------+--------+--------+  Proximal Stent 126             biphasic           +---------------+--------+--------+--------+--------+  Mid Stent      116             biphasic          +---------------+--------+--------+--------+--------+  Distal Stent   124             biphasic          +---------------+--------+--------+--------+--------+  Distal to [id number]             biphasic          +---------------+--------+--------+--------+--------+       +---------------+--------+--------+--------+--------+  EIA  PSV cm/sStenosisWaveformComments  +---------------+--------+--------+--------+--------+  Prox to Stent  142             biphasic          +---------------+--------+--------+--------+--------+  Proximal Stent 163             biphasic          +---------------+--------+--------+--------+--------+  Mid Stent      175             biphasic          +---------------+--------+--------+--------+--------+  Distal Stent   169             biphasic          +---------------+--------+--------+--------+--------+  Distal to [id number]             biphasic          +---------------+--------+--------+--------+--------+          Summary:  Stenosis: +--------------------+-------------+-----------+  Location            Stenosis     Stent        +--------------------+-------------+-----------+  Right Common Iliac               no stenosis  +--------------------+-------------+-----------+  Left Common Iliac                no stenosis  +--------------------+-------------+-----------+  Right External Iliac<50% stenosis             +--------------------+-------------+-----------+  Left External Iliac              no stenosis  +--------------------+-------------+-----------+    ABI Findings:  +---------+------------------+-----+----------+--------+  Right   Rt Pressure (mmHg)IndexWaveform  Comment   +---------+------------------+-----+----------+--------+  Brachial 105                                         +---------+------------------+-----+----------+--------+  PTA     81                0.73 biphasic            +---------+------------------+-----+----------+--------+  DP      80                0.72 monophasic          +---------+------------------+-----+----------+--------+  Great Toe42                0.38                     +---------+------------------+-----+----------+--------+   +---------+------------------+-----+-----------+-------+  Left    Lt Pressure (mmHg)IndexWaveform   Comment  +---------+------------------+-----+-----------+-------+  Brachial 111                                        +---------+------------------+-----+-----------+-------+  PTA     100               0.90 multiphasic         +---------+------------------+-----+-----------+-------+  DP      108               0.97 multiphasic         +---------+------------------+-----+-----------+-------+  Great Toe85                0.77                     +---------+------------------+-----+-----------+-------+   +-------+-----------+-----------+------------+------------+  ABI/TBIToday's ABIToday's TBIPrevious ABIPrevious TBI  +-------+-----------+-----------+------------+------------+  Right 0.73       0.38       0.43        0.46          +-------+-----------+-----------+------------+------------+  Left  0.97       0.77       0.71        0.46          +-------+-----------+-----------+------------+------------+       Bilateral ABIs appear increased compared to prior study on 06/29/23.    Summary:  Right: Resting right ankle-brachial index indicates moderate right lower  extremity arterial disease. The right toe-brachial index is abnormal.   Left: Resting left ankle-brachial index is within normal range. The left  toe-brachial index is normal.       Assessment/Plan:     68 year old female status post stenting of  right common iliac artery and left common and external iliac arteries for ischemic rest pain of the right greater than left lower extremity now with significant improvement in the ABIs and no further rest pain and denies claudication.  Unfortunately from the original procedure she had a stroke from which she continues to improve and ultimately was anticoagulated for a DVT and ended up with right occipital hemorrhage for which an IVC filter was placed 1 month ago.  I will plan to see her back in about 4 months with ABIs and we can discuss filter removal at that time.  She will need follow-up aortoiliac duplexes in the future as well as carotid duplex given finding of carotid stenosis on CT angio after her stroke.  Hopefully she will continue to recover and continue on Plavix .    Adine Hoof MD [facility]

## 2023-09-07 ENCOUNTER — Ambulatory Visit: Payer: Medicare Other

## 2023-09-07 ENCOUNTER — Encounter (HOSPITAL_COMMUNITY): Payer: Medicare Other

## 2023-09-07 ENCOUNTER — Ambulatory Visit: Payer: Self-pay

## 2023-09-07 NOTE — Telephone Encounter (Signed)
 Chief Complaint: Low blood sugar Symptoms: Weak Frequency: comes and goes  Pertinent Negatives: Patient denies vomiting, headache, fever Disposition: [] ED /[] Urgent Care (no appt availability in office) / [x] Appointment(In office/virtual)/ []  [facility]/ [] Home Care/ [] Refused Recommended Disposition /[] [facility]/ []  Follow-up with PCP Additional Notes: Patient states she has been experiencing low blood sugars on and off since yesterday morning. Patient reports before she called her blood sugar was 46, she took 3 glucose tablets and I had her recheck her blood sugar 10 minutes later, it was 94. Care advice was given and patient verbalized understanding. Appointment scheduled to discuss low sugars with PCP. Copied from CRM [id number]. Topic: Clinical - Red Word Triage >> Sep 07, 2023  4:45 PM Star [place] wrote: Red Word that prompted transfer to Nurse Triage: blood sugar keeps running low -  46 right  now Reason for Disposition  [1] Blood glucose 70 mg/dl (3.9 mmol/l) or below, OR symptomatic AND [2] cause known  Answer Assessment - Initial Assessment Questions 1. SYMPTOMS: "What symptoms are you concerned about?"     Low blood sugar 2. ONSET:  "When did the symptoms start?"     Yesterday morning 3. BLOOD GLUCOSE: "What is your blood glucose level?"      46 now 4. USUAL RANGE: "What is your blood glucose level usually?" (e.g., usual fasting morning value, usual evening value)     120 or above  5. TYPE 1 or 2:  "Do you know what type of diabetes you have?"  (e.g., Type 1, Type 2, Gestational; doesn't know)      Pre-diabetic  6. INSULIN: "Do you take insulin?" "What type of insulin(s) do you use? What is the mode of delivery? (syringe, pen; injection or pump) "When did you last give yourself an insulin dose?" (i.e., time or hours/minutes ago) "How much did you give?" (i.e., how many units)     No  7. DIABETES PILLS: "Do you take any pills for your diabetes?" If Yes, ask:  "What is the name of the medicine(s) that you take for high blood sugar?"     No  8. OTHER SYMPTOMS: "Do you have any symptoms?" (e.g., fever, frequent urination, difficulty breathing, vomiting)     weak 9. LOW BLOOD GLUCOSE TREATMENT: "What have you done so far to treat the low blood glucose level?"     Glucose tablet  10. FOOD: "When did you last eat or drink?"       I ate around 1300, I had cornbread, meatloaf  11. ALONE: "Are you alone right now or is someone with you?"        I am by myself  12. PREGNANCY: "Is there any chance you are pregnant?" "When was your last menstrual period?"       No  Protocols used: Diabetes - Low Blood Sugar-A-AH

## 2023-09-15 ENCOUNTER — Encounter: Payer: Self-pay | Admitting: Family Medicine

## 2023-09-15 ENCOUNTER — Ambulatory Visit: Admitting: Family Medicine

## 2023-09-15 VITALS — BP 140/70 | HR 79 | Temp 98.0°F | Ht 67.0 in | Wt 153.0 lb

## 2023-09-15 DIAGNOSIS — I10 Essential (primary) hypertension: Secondary | ICD-10-CM | POA: Diagnosis not present

## 2023-09-15 MED ORDER — VALSARTAN 320 MG PO TABS: 320.0000 mg | ORAL_TABLET | Freq: Every day | ORAL | 3 refills | Status: AC

## 2023-09-15 MED ORDER — CARVEDILOL 3.125 MG PO TABS: 6.2500 mg | ORAL_TABLET | Freq: Two times a day (BID) | ORAL | 3 refills | Status: AC

## 2023-09-15 NOTE — Progress Notes (Signed)
 Subjective:    Patient ID: [name], female    DOB: 07/24/1955, 68 y.o.   MRN: [id number] 08/02/23 See my last office visit.  After my last visit, the patient returned to the hospital with left-sided numbness.  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  They also discontinued aspirin  and maintain the patient only on Eliquis  for secondary prevention of future strokes.  Patient has had very difficult to control blood pressure since that visit.  Her systolic blood pressure is fluctuating between 160 and 200 despite taking carvedilol  and amlodipine .  08/11/23 Patient went back to the emergency room on March 30 due to blood pressure that was greater than 230 systolic.  She stated that at home her blood pressure was 250/110.  She is currently been taking 300 mg of irbesartan , 10 mg of amlodipine , 6.25 mg of carvedilol  twice daily, and clonidine  occasionally.  There is some debate between the patient and her daughter whether she has been taking it once a day or twice a day.  Patient denies that anxiety is playing any role in her blood pressure going up.  She has been checking her blood pressure numerous times a day.  She is confused about when to go to the hospital.  09/15/22 Ultimately, the patient had to stop clonidine  patches.  Instead she is using oral clonidine  as needed if her blood pressure is elevated.  However her blood pressure has recently been well-controlled with systolic blood pressures typically in the 140 range.  Unfortunately, on the irbesartan  300 mg a day she has experienced 2 episodes of hypoglycemia into the 40s.  She is soon to see irbesartan  so she stopped the medication independently.  Since stopping irbesartan  the hypoglycemic episodes have subsided.  She previously took valsartan  for years with no issues. Past Medical History:  Diagnosis Date   Anxiety     Carotid stenosis, asymptomatic, bilateral    Cataract    Chronic left shoulder pain    Headache    Hiatal hernia    small   Hyperlipidemia    Hypertension    Osteoporosis    Pre-diabetes    Schatzki's ring    Seasonal allergies    Stroke [facility])    Tremor      Past Surgical History:  Procedure Laterality Date   ABDOMINAL AORTOGRAM W/LOWER EXTREMITY N/A 07/11/2023   Procedure: ABDOMINAL AORTOGRAM W/LOWER EXTREMITY;  Surgeon: Adine Hoof, MD;  Location: [facility] INVASIVE CV LAB;  Service: Cardiovascular;  Laterality: N/A;   CARDIAC CATHETERIZATION  2005   "Ms. Wielgus has essentially normal coronary arteries and normal left ventricular function.  She will be treated empirically with anti-reflux measures"   CATARACT EXTRACTION, BILATERAL     CHOLECYSTECTOMY     COLONOSCOPY    06/16/2004   [id number] rectum/Diminutive polyps of the splenic flexure and the cecum/The remainder of the colonic mucosa appeared normal pathology (hyperplastic polyps).   COLONOSCOPY N/A 08/07/2015   Dr. Riley Cheadle: 6 mm descending colon tubular adenoma, multiple medium-mouthed diverticula in sigmoid colon. surveillance 2022   COLONOSCOPY N/A 04/15/2021   Procedure: COLONOSCOPY;  Surgeon: Suzette Espy, MD;  Location[facility] ENDO SU[facility];  Service: Endoscopy;  Laterality: N/A;  9:30am   ESOPHAGOGASTRODUODENOSCOPY  09/11/2001   ONG:[id number] ring otherwise normal esophagus/Normal stomach/Normal D1 and D2 status post passage of a 56 [place]  dilator   ESOPHAGOGASTRODUODENOSCOPY N/A 11/11/2017   Procedure: ESOPHAGOGASTRODUODENOSCOPY (EGD);  Surgeon: Suzette Espy, MD; mild Schatzki ring s/p dilation and mild erosive gastropathy.   ESOPHAGOGASTRODUODENOSCOPY (EGD) WITH ESOPHAGEAL DILATION N/A 01/25/2013   Dr. Riley Cheadle- schatzki's ring- 54 F dilation. small hiatal hernia   HYSTEROSCOPY WITH D & C N/A 03/06/2019   Procedure: DILATATION AND CURETTAGE /HYSTEROSCOPY;  Surgeon: Albino Hum, MD;  Location: [facility]  ORS;  Service: Gynecology;  Laterality: N/A;   MALONEY DILATION N/A 11/11/2017   Procedure: Londa Rival DILATION;  Surgeon: Suzette Espy, MD;  Location[facility];  Service: Endoscopy;  Laterality: N/A;   PERIPHERAL VASCULAR BALLOON ANGIOPLASTY Left 07/11/2023   Procedure: PERIPHERAL VASCULAR BALLOON ANGIOPLASTY;  Surgeon: Adine Hoof, MD;  Location: [facility] INVASIVE CV LAB;  Service: Cardiovascular;  Laterality: Left;  External Iliac   PERIPHERAL VASCULAR INTERVENTION Bilateral 07/11/2023   Procedure: PERIPHERAL VASCULAR INTERVENTION;  Surgeon: Adine Hoof, MD;  Location: [facility] INVASIVE CV LAB;  Service: Cardiovascular;  Laterality: Bilateral;  Bilateral Common Iliac; L External Iliac   POLYPECTOMY N/A 03/06/2019   Procedure: POLYPECTOMY ( REMOVAL OF ENDOMETRIAL POLYP);  Surgeon: Albino Hum, MD;  Location: [facility] ORS;  Service: Gynecology;  Laterality: N/A;   POLYPECTOMY  04/15/2021   Procedure: POLYPECTOMY;  Surgeon: Suzette Espy, MD;  Location[facility];  Service: Endoscopy;;   TUBAL LIGATION     VENA CAVA UMBRELLA NECK APPROACH N/A 07/26/2023   Procedure: INSERTION, UMBRELLA FILTER, INFERIOR VENA CAVA FEMORAL ARTERY;  Surgeon: Adine Hoof, MD;  Location: [facility] OR;  Service: Vascular;  Laterality: N/A;   Current Outpatient Medications on File Prior to Visit  Medication Sig Dispense Refill   acetaminophen  (TYLENOL ) 500 MG tablet Take 500 mg by mouth every 6 (six) hours as needed for moderate pain or headache.     alendronate  (FOSAMAX ) 70 MG tablet TAKE 1 TABLET(70 MG) BY MOUTH EVERY 7 DAYS WITH A FULL GLASS OF WATER  AND ON AN EMPTY STOMACH 4 tablet 11   amLODipine  (NORVASC ) 10 MG tablet Take 10 mg by mouth at bedtime.     Cholecalciferol (VITAMIN D) 50 MCG (2000 UT) CAPS Take 2,000 Units by mouth daily.     cloNIDine  (CATAPRES  - DOSED IN MG/24 HR) 0.3 mg/24hr patch Place 1 patch (0.3 mg total) onto the skin once a week. 4 patch 12   cloNIDine  (CATAPRES ) 0.1  MG tablet Take 1 tablet (0.1 mg total) by mouth 3 (three) times daily as needed (180/100). 90 tablet 3   clopidogrel  (PLAVIX ) 75 MG tablet Take 1 tablet (75 mg total) by mouth daily. 90 tablet 0   Coenzyme Q10 (COQ10) 100 MG CAPS Take 100 mg by mouth daily.     CRANBERRY PO Take 1 each by mouth 2 (two) times a week. Chewables     Cyanocobalamin  (B-12) 2500 MCG TABS Take 2,500 mcg by mouth daily.     dexlansoprazole  (DEXILANT ) 60 MG capsule Take 1 capsule (60 mg total) by mouth daily. 30 capsule 11   EPINEPHrine  0.3 mg/0.3 mL IJ SOAJ injection Inject 0.3 mg into the muscle as needed for anaphylaxis. 1 each 1   fluticasone  (FLONASE ) 50 MCG/ACT nasal spray Place 2 sprays into both nostrils daily as needed for allergies.     hydrochlorothiazide  (HYDRODIURIL ) 25 MG tablet Take 1 tablet (25 mg total) by mouth daily. 90 tablet 3   hydroquinone 4 % cream Apply 1 application topically 2 (two) times daily as needed (dark spots).  ipratropium (ATROVENT ) 0.03 % nasal spray INSTILL 2 SPRAYS IN EACH NOSTRIL EVERY 12 HOURS (Patient taking differently: Place 2 sprays into both nostrils 2 (two) times daily as needed for rhinitis.) 30 mL 12   loratadine  (CLARITIN ) 10 MG tablet Take 10 mg by mouth daily as needed for allergies.     MAGNESIUM  PO Take 330 mg by mouth daily.     ondansetron  (ZOFRAN ) 4 MG tablet TAKE 1 TABLET(4 MG) BY MOUTH EVERY 8 HOURS AS NEEDED FOR NAUSEA OR VOMITING 30 tablet 0   rosuvastatin  (CRESTOR ) 20 MG tablet Take 1 tablet (20 mg total) by mouth daily. 90 tablet 3   sodium chloride  (OCEAN) 0.65 % SOLN nasal spray Place 1 spray into both nostrils as needed for congestion.     triamcinolone  cream (KENALOG ) 0.1 % Apply 1 Application topically 2 (two) times daily. (Patient taking differently: Apply 1 Application topically 2 (two) times daily as needed (irritation).) 30 g 0   vitamin E 400 UNIT capsule Take 400 Units by mouth 3 (three) times a week.     zinc gluconate 50 MG tablet Take 50 mg by  mouth 3 (three) times a week.     Current Facility-Administered Medications on File Prior to Visit  Medication Dose Route Frequency Provider Last Rate Last Admin   nitroGLYCERIN  (NITROSTAT ) SL tablet 0.4 mg  0.4 mg Sublingual Once             Allergies  Allergen Reactions   Ciprofloxacin Shortness Of Breath   Latex Other (See Comments)    Burns skin   Vitamin C [Bioflavonoid Products] Itching   Social History   Socioeconomic History   Marital status: Single    Spouse name: Not on file   Number of children: 3   Years of education: Not on file   Highest education level: 10th grade  Occupational History   Occupation: Public affairs consultant: [employer]  Tobacco Use   Smoking status: Former    Current packs/day: 0.00    Average packs/day: 0.3 packs/day for 35.0 years (8.8 ttl pk-yrs)    Types: Cigarettes    Start date: 12/31/1977    Quit date: 12/31/2012    Years since quitting: 10.7   Smokeless tobacco: Never  Vaping Use   Vaping status: Never Used  Substance and Sexual Activity   Alcohol use: Not Currently   Drug use: No   Sexual activity: Not Currently    Partners: Male    Birth control/protection: Post-menopausal, Surgical    Comment: tubal  Other Topics Concern   Not on file  Social History Narrative   Eats all food groups.    Wears seatbelt.    Has 3 children.   Prior smoker.   Attends church.    Used to work in Designer, fashion/clothing.    Drives.   Enjoys going out with family.    Right handed   One story home   Social Drivers of Health   Financial Resource Strain: Low Risk  (06/02/2023)   Overall Financial Resource Strain (CARDIA)    Difficulty of Paying Living Expenses: Not very hard  Food Insecurity: No Food Insecurity (08/02/2023)   Hunger Vital Sign    Worried About Running Out of Food in the Last Year: Never true    Ran Out of Food in the Last Year: Never true  Transportation Needs: No Transportation Needs (08/02/2023)   PRAPARE - Transportation     Lack of Transportation (Medical): No    Lack  of Transportation (Non-Medical): No  Physical Activity: Sufficiently Active (06/02/2023)   Exercise Vital Sign    Days of Exercise per Week: 2 days    Minutes of Exercise per Session: 90 min  Recent Concern: Physical Activity - Insufficiently Active (03/18/2023)   Exercise Vital Sign    Days of Exercise per Week: 3 days    Minutes of Exercise per Session: 30 min  Stress: Stress Concern Present (06/02/2023)   [employer] of Occupational Health - Occupational Stress Questionnaire    Feeling of Stress : To some extent  Social Connections: Unknown (07/27/2023)   Social Connection and Isolation Panel [NHANES]    Frequency of Communication with Friends and Family: More than three times a week    Frequency of Social Gatherings with Friends and Family: Three times a week    Attends Religious Services: More than 4 times per year    Active Member of Clubs or Organizations: Yes    Attends Banker Meetings: More than 4 times per year    Marital Status: Patient declined  Intimate Partner Violence: Not At Risk (08/02/2023)   Humiliation, Afraid, Rape, and Kick questionnaire    Fear of Current or Ex-Partner: No    Emotionally Abused: No    Physically Abused: No    Sexually Abused: No   Family History  Problem Relation Age of Onset   Diabetes Other    Diabetes Mother    Heart disease Mother    Heart disease Father    Diabetes Sister    Diabetes Brother    Hypertension Brother    Diabetes Daughter    Hypertension Maternal Grandmother    Stroke Maternal Grandfather    Early death Paternal Grandfather    Colon cancer Neg Hx    Liver disease Neg Hx       Review of Systems  All other systems reviewed and are negative.      Objective:   Physical Exam Vitals reviewed.  Constitutional:      General: She is not in acute distress.    Appearance: Normal appearance. She is well-developed and normal weight. She is not  ill-appearing.  HENT:     Right Ear: Tympanic membrane and ear canal normal.     Left Ear: Tympanic membrane and ear canal normal.     Nose: No congestion or rhinorrhea.     Right Sinus: No maxillary sinus tenderness or frontal sinus tenderness.     Left Sinus: No maxillary sinus tenderness or frontal sinus tenderness.  Eyes:     General: No visual field deficit.    Extraocular Movements: Extraocular movements intact.     Right eye: Normal extraocular motion and no nystagmus.     Left eye: Normal extraocular motion and no nystagmus.     Conjunctiva/sclera: Conjunctivae normal.     Pupils: Pupils are equal, round, and reactive to light.     Right eye: Pupil is round and reactive.     Left eye: Pupil is round and reactive.  Neck:     Vascular: No carotid bruit.  Cardiovascular:     Rate and Rhythm: Normal rate and regular rhythm.     Heart sounds: Normal heart sounds. No murmur heard.    No friction rub. No gallop.  Pulmonary:     Effort: Pulmonary effort is normal. No respiratory distress.     Breath sounds: Normal breath sounds. No stridor. No wheezing or rales.  Abdominal:     General: Abdomen is  flat. Bowel sounds are normal. There is no distension.     Palpations: Abdomen is soft.     Tenderness: There is no abdominal tenderness. There is no guarding.  Musculoskeletal:     Right hand: Normal. No swelling, deformity, tenderness or bony tenderness. Normal range of motion.     Left hand: Normal. No swelling, deformity, tenderness or bony tenderness. Normal range of motion.     Cervical back: Normal range of motion. No rigidity.     Right lower leg: Normal. No swelling, deformity, tenderness or bony tenderness. No edema.     Left lower leg: No deformity, tenderness or bony tenderness.     Right ankle: Normal. No swelling.     Left ankle: Normal. No swelling.  Lymphadenopathy:     Cervical: No cervical adenopathy.  Neurological:     General: No focal deficit present.     Mental  Status: She is alert and oriented to person, place, and time. Mental status is at baseline.     Cranial Nerves: No cranial nerve deficit, dysarthria or facial asymmetry.     Sensory: No sensory deficit.     Motor: Tremor present. No weakness or abnormal muscle tone.     Coordination: Romberg sign negative. Coordination normal.     Gait: Gait normal.  Psychiatric:        Mood and Affect: Mood normal.        Behavior: Behavior normal.        Thought Content: Thought content normal.        Judgment: Judgment normal.         Assessment & Plan:  Benign essential HTN Patient's blood pressure has been well-controlled.  I do not believe the irbesartan  is causing her hypoglycemia but we will switch to Valsartan  320 mg daily.  Discontinue irbesartan 

## 2023-09-16 ENCOUNTER — Other Ambulatory Visit: Payer: Self-pay | Admitting: Family Medicine

## 2023-09-18 ENCOUNTER — Other Ambulatory Visit: Payer: Self-pay | Admitting: Family Medicine

## 2023-09-18 DIAGNOSIS — J329 Chronic sinusitis, unspecified: Secondary | ICD-10-CM

## 2023-09-19 NOTE — Telephone Encounter (Signed)
 Requested medication (s) are due for refill today - expired Rx  Requested medication (s) are on the active medication list -yes  Future visit scheduled -yes  Last refill: 10/29/21 30g  Notes to clinic: non delegated Rx  Requested Prescriptions  Pending Prescriptions Disp Refills   triamcinolone  cream (KENALOG ) 0.1 % [Pharmacy Med Name: TRIAMCINOLONE  0.1% CREAM  30GM] 30 g 0    Sig: APPLY TOPICALLY TO THE AFFECTED AREA TWICE DAILY     Not Delegated - Dermatology:  Corticosteroids Failed - 09/19/2023  2:00 PM      Failed - This refill cannot be delegated      Passed - Valid encounter within last 12 months    Recent Outpatient Visits           4 days ago Benign essential HTN   [place] [facility] Austine Lefort, MD   1 month ago Benign essential HTN   [place] [facility] Cheril Cork, Cisco Crest, MD   1 month ago Benign essential HTN   [place] [facility] Cheril Cork, Cisco Crest, MD   1 month ago Benign essential HTN   [place] [facility] Austine Lefort, MD   4 months ago Left-sided chest wall pain   [place] [facility] Jenelle Mis, [place]                 Requested Prescriptions  Pending Prescriptions Disp Refills   triamcinolone  cream (KENALOG ) 0.1 % [Pharmacy Med Name: TRIAMCINOLONE  0.1% CREAM  30GM] 30 g 0    Sig: APPLY TOPICALLY TO THE AFFECTED AREA TWICE DAILY     Not Delegated - Dermatology:  Corticosteroids Failed - 09/19/2023  2:00 PM      Failed - This refill cannot be delegated      Passed - Valid encounter within last 12 months    Recent Outpatient Visits           4 days ago Benign essential HTN   [place] [facility] Austine Lefort, MD   1 month ago Benign essential HTN   [place] [facility] Austine Lefort, MD   1 month ago Benign essential HTN   [place] [facility] Austine Lefort,  MD   1 month ago Benign essential HTN   [place] [facility] Austine Lefort, MD   4 months ago Left-sided chest wall pain   [place] [facility] Jenelle Mis, [place]

## 2023-09-20 NOTE — Telephone Encounter (Signed)
 Requested medication (s) are due for refill today: yes  Requested medication (s) are on the active medication list: yes  Last refill:  04/17/21 #30 12 RF  Future visit scheduled: yes  Notes to clinic:  med not assigned to a protocol   Requested Prescriptions  Pending Prescriptions Disp Refills   ipratropium (ATROVENT ) 0.03 % nasal spray [Pharmacy Med Name: IPRATROPIUM 0.03% NAS SP30ML (345)] 30 mL     Sig: USE 2 SPRAYS IN EACH NOSTRIL EVERY 12 HOURS     Off-Protocol Failed - 09/20/2023 12:53 PM      Failed - Medication not assigned to a protocol, review manually.      Passed - Valid encounter within last 12 months    Recent Outpatient Visits           5 days ago Benign essential HTN   [place] [facility] Cheril Cork, Cisco Crest, MD   1 month ago Benign essential HTN   [place] [facility] Cheril Cork, Cisco Crest, MD   1 month ago Benign essential HTN   [place] [facility] Austine Lefort, MD   2 months ago Benign essential HTN   [place] [facility] Austine Lefort, MD   4 months ago Left-sided chest wall pain   [place] [facility] Jenelle Mis, FNP             Off-Protocol Failed - 09/20/2023 12:53 PM      Failed - Medication not assigned to a protocol, review manually.      Passed - Valid encounter within last 12 months    Recent Outpatient Visits           5 days ago Benign essential HTN   [place] [facility] Pickard, Cisco Crest, MD   1 month ago Benign essential HTN   [place] [facility] Cheril Cork, Cisco Crest, MD   1 month ago Benign essential HTN   [place] [facility] Cheril Cork, Cisco Crest, MD   2 months ago Benign essential HTN   [place] [facility] Austine Lefort, MD   4 months ago Left-sided chest wall pain    [facility] Jenelle Mis, FNP               Refused Prescriptions Disp Refills   potassium chloride  SA (KLOR-CON  M) 20 MEQ tablet [Pharmacy Med Name: POTASSIUM CL ER TABLETS] 30 tablet     Sig: TAKE 1 TABLET(20 MEQ) BY MOUTH DAILY     Endocrinology:  Minerals - Potassium Supplementation Passed - 09/20/2023 12:53 PM      Passed - K in normal range and within 360 days    Potassium  Date Value Ref Range Status  08/07/2023 4.1 3.5 - 5.1 mmol/L Final  11/16/2012 4.1 mmol/L Final         Passed - Cr in normal range and within 360 days    Creat  Date Value Ref Range Status  07/22/2023 0.87 0.50 - 1.05 mg/dL Final   Creatinine, Ser  Date Value Ref Range Status  08/07/2023 0.75 0.44 - 1.00 mg/dL Final   Creatinine, Urine  Date Value Ref Range Status  12/22/2022 114 20 - 275 mg/dL Final         Passed - Valid encounter within last 12 months    Recent Outpatient Visits  5 days ago Benign essential HTN   [place] [facility] Cheril Cork, Cisco Crest, MD   1 month ago Benign essential HTN   [place] [facility] Cheril Cork, Cisco Crest, MD   1 month ago Benign essential HTN   [place] [facility] Austine Lefort, MD   2 months ago Benign essential HTN   [place] [facility] Austine Lefort, MD   4 months ago Left-sided chest wall pain   [place] [facility] Jenelle Mis, FNP

## 2023-09-20 NOTE — Telephone Encounter (Signed)
 Requested Prescriptions  Pending Prescriptions Disp Refills   ipratropium (ATROVENT ) 0.03 % nasal spray [Pharmacy Med Name: IPRATROPIUM 0.03% NAS SP30ML (345)] 30 mL     Sig: USE 2 SPRAYS IN EACH NOSTRIL EVERY 12 HOURS     Off-Protocol Failed - 09/20/2023 12:52 PM      Failed - Medication not assigned to a protocol, review manually.      Passed - Valid encounter within last 12 months    Recent Outpatient Visits           5 days ago Benign essential HTN   [place] [facility] [place] [facility] Cheril Cork, Cisco Crest, MD   1 month ago Benign essential HTN   [place] [facility] Cheril Cork, Cisco Crest, MD   1 month ago Benign essential HTN   [place] [facility] Austine Lefort, MD   2 months ago Benign essential HTN   [place] [facility] Austine Lefort, MD   4 months ago Left-sided chest wall pain   [place] [facility] Jenelle Mis, FNP             Off-Protocol Failed - 09/20/2023 12:52 PM      Failed - Medication not assigned to a protocol, review manually.      Passed - Valid encounter within last 12 months    Recent Outpatient Visits           5 days ago Benign essential HTN   [place] [facility] Pickard, Cisco Crest, MD   1 month ago Benign essential HTN   [place] [facility] Cheril Cork, Cisco Crest, MD   1 month ago Benign essential HTN   [place] [facility] Cheril Cork, Cisco Crest, MD   2 months ago Benign essential HTN   [place] [facility] Austine Lefort, MD   4 months ago Left-sided chest wall pain   [place] [facility] Jenelle Mis, FNP               potassium chloride  SA (KLOR-CON  M) 20 MEQ tablet [Pharmacy Med Name: POTASSIUM CL ER TABLETS] 30 tablet     Sig: TAKE 1 TABLET(20 MEQ) BY MOUTH DAILY     Endocrinology:  Minerals - Potassium Supplementation Passed - 09/20/2023  12:52 PM      Passed - K in normal range and within 360 days    Potassium  Date Value Ref Range Status  08/07/2023 4.1 3.5 - 5.1 mmol/L Final  11/16/2012 4.1 mmol/L Final         Passed - Cr in normal range and within 360 days    Creat  Date Value Ref Range Status  07/22/2023 0.87 0.50 - 1.05 mg/dL Final   Creatinine, Ser  Date Value Ref Range Status  08/07/2023 0.75 0.44 - 1.00 mg/dL Final   Creatinine, Urine  Date Value Ref Range Status  12/22/2022 114 20 - 275 mg/dL Final         Passed - Valid encounter within last 12 months    Recent Outpatient Visits           5 days ago Benign essential HTN   [place] [facility] Austine Lefort, MD   1 month ago Benign essential HTN   [place] [facility] Austine Lefort, MD   1 month ago Benign essential  HTN   [place] [facility] Cheril Cork, Cisco Crest, MD   2 months ago Benign essential HTN   [place] [facility] Austine Lefort, MD   4 months ago Left-sided chest wall pain   [place] [facility] Jenelle Mis, [place]

## 2023-09-21 ENCOUNTER — Other Ambulatory Visit: Payer: Self-pay

## 2023-09-29 DIAGNOSIS — J309 Allergic rhinitis, unspecified: Secondary | ICD-10-CM | POA: Diagnosis not present

## 2023-09-29 DIAGNOSIS — J300 Vasomotor rhinitis: Secondary | ICD-10-CM | POA: Diagnosis not present

## 2023-09-29 DIAGNOSIS — M26641 Arthritis of right temporomandibular joint: Secondary | ICD-10-CM | POA: Diagnosis not present

## 2023-09-29 DIAGNOSIS — H9201 Otalgia, right ear: Secondary | ICD-10-CM | POA: Diagnosis not present

## 2023-10-05 ENCOUNTER — Encounter (HOSPITAL_COMMUNITY)

## 2023-10-05 ENCOUNTER — Ambulatory Visit

## 2023-10-07 ENCOUNTER — Other Ambulatory Visit: Payer: Self-pay | Admitting: Family Medicine

## 2023-10-10 NOTE — Telephone Encounter (Signed)
 Requested medication (s) are due for refill today -unsure  Requested medication (s) are on the active medication list -yes  Future visit scheduled -tes  Last refill: unsure  Notes to clinic: historical provider listed- sent for review    Requested Prescriptions  Pending Prescriptions Disp Refills   amLODipine  (NORVASC ) 10 MG tablet [Pharmacy Med Name: AMLODIPINE  BESYLATE 10MG TABLETS] 90 tablet     Sig: TAKE 1 TABLET(10 MG) BY MOUTH AT BEDTIME     Cardiovascular: Calcium  Channel Blockers 2 Failed - 10/10/2023  9:41 AM      Failed - Last BP in normal range    BP Readings from Last 1 Encounters:  09/15/23 (!) 140/70         Passed - Last Heart Rate in normal range    Pulse Readings from Last 1 Encounters:  09/15/23 79         Passed - Valid encounter within last 6 months    Recent Outpatient Visits           3 weeks ago Benign essential HTN   [place] [facility] Austine Lefort, MD   2 months ago Benign essential HTN   [place] [facility] Austine Lefort, MD   2 months ago Benign essential HTN   [place] [facility] Austine Lefort, MD   2 months ago Benign essential HTN   [place] [facility] Austine Lefort, MD   5 months ago Left-sided chest wall pain   [place] [facility] Jenelle Mis, [place]                 Requested Prescriptions  Pending Prescriptions Disp Refills   amLODipine  (NORVASC ) 10 MG tablet [Pharmacy Med Name: AMLODIPINE  BESYLATE 10MG TABLETS] 90 tablet     Sig: TAKE 1 TABLET(10 MG) BY MOUTH AT BEDTIME     Cardiovascular: Calcium  Channel Blockers 2 Failed - 10/10/2023  9:41 AM      Failed - Last BP in normal range    BP Readings from Last 1 Encounters:  09/15/23 (!) 140/70         Passed - Last Heart Rate in normal range    Pulse Readings from Last 1 Encounters:  09/15/23 79         Passed - Valid encounter within last 6 months     Recent Outpatient Visits           3 weeks ago Benign essential HTN   [place] [facility] Austine Lefort, MD   2 months ago Benign essential HTN   [place] [facility] Austine Lefort, MD   2 months ago Benign essential HTN   [place] [facility] Austine Lefort, MD   2 months ago Benign essential HTN    [facility] Austine Lefort, MD   5 months ago Left-sided chest wall pain    [facility] Jenelle Mis, [place]

## 2023-10-13 ENCOUNTER — Encounter: Payer: Self-pay | Admitting: Gastroenterology

## 2023-10-13 ENCOUNTER — Ambulatory Visit: Admitting: Gastroenterology

## 2023-10-13 VITALS — BP 159/74 | HR 69 | Temp 98.5°F | Ht 66.0 in | Wt 156.4 lb

## 2023-10-13 DIAGNOSIS — R109 Unspecified abdominal pain: Secondary | ICD-10-CM | POA: Diagnosis not present

## 2023-10-13 DIAGNOSIS — R1013 Epigastric pain: Secondary | ICD-10-CM

## 2023-10-13 DIAGNOSIS — K5900 Constipation, unspecified: Secondary | ICD-10-CM

## 2023-10-13 DIAGNOSIS — K117 Disturbances of salivary secretion: Secondary | ICD-10-CM | POA: Diagnosis not present

## 2023-10-13 DIAGNOSIS — R634 Abnormal weight loss: Secondary | ICD-10-CM

## 2023-10-13 DIAGNOSIS — Z860101 Personal history of adenomatous and serrated colon polyps: Secondary | ICD-10-CM | POA: Diagnosis not present

## 2023-10-13 DIAGNOSIS — Z87891 Personal history of nicotine dependence: Secondary | ICD-10-CM | POA: Diagnosis not present

## 2023-10-13 MED ORDER — ONDANSETRON HCL 4 MG PO TABS: 4.0000 mg | ORAL_TABLET | Freq: Three times a day (TID) | ORAL | 2 refills | Status: AC | PRN

## 2023-10-13 MED ORDER — PANTOPRAZOLE SODIUM 40 MG PO TBEC: 40.0000 mg | DELAYED_RELEASE_TABLET | Freq: Two times a day (BID) | ORAL | 3 refills | Status: DC

## 2023-10-13 MED ORDER — LUBIPROSTONE 24 MCG PO CAPS: 24.0000 ug | ORAL_CAPSULE | Freq: Two times a day (BID) | ORAL | 3 refills | Status: DC

## 2023-10-13 NOTE — Progress Notes (Signed)
 Gastroenterology Office Note     Primary Care Physician:  Austine Lefort, MD  Primary Gastroenterologist: Dr. Riley Cheadle    Chief Complaint   Chief Complaint  Patient presents with   Nausea    Having issues with nausea and abdominal pain/burning     History of Present Illness   [name] is a 68 y.o. female presenting today with a history of GERD, constipation, dysphagia, last seen in Oct 2024. She was last seen in Oct 2024 with plans for EGD/dilation.   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 She is on Plavix .   At last visit, she was changed from Dexilant  to pantoprazole  BID. No dysphagia currently. Notes abdominal pain and burning that occurs in upper abdomen. Will happen twice a week then be ok for a few weeks. Still taking Dexilant  and feels like pantoprazole  was better. When taking pantoprazole  didn't have abdominal burning as much. Nausea was also better on pantoprazole  BID. Gets saliva in mouth. Has to spit out. Abdominal burning not necessarily triggered by eating. Not much of appetite. Has saliva build up in the evenings.   No longer on anything for constipation. Having BM every 2-3 days. Had been taking Trulance . Trulance  had to strain. Linzess  seems to work better but still had to strain.    Has ENT in [place].    Today: 156 02/16/23 168 lb 3.2 oz (76.3 kg)  02/03/23 163 lb 9.6 oz (74.2 kg)  01/20/23 166 lb (75.3 kg)  01/04/23 164 lb 6.4 oz (74.6 kg)  12/22/22 167 lb (75.8 kg)  11/24/22 167 lb (75.8 kg)  11/09/22 165 lb 5.5 oz (75 kg)  09/01/22 165 lb (74.8 kg)  08/20/22 162 lb (73.5 kg)  07/19/22  165 lb (74.8 kg)  07/05/22 163 lb 12.8 oz (74.3 kg)  06/21/22 167 lb (75.8 kg)  06/11/22 163 lb (73.9 kg)  06/01/22 164 lb (74.4 kg)  05/18/22 164 lb 3.2 oz (74.5 kg)    Colonoscopy 04/2021: -diverticulosis -four 4-103mm polyps sigmoid colon and asc colon removed -ascending colon polyps tubular adenoma -next colonoscopy in 3 years   EGD 11/2017: -mild Schatzki ring s/p dilation -erosive gastropathy   CTA March 2025: need comment on vasculature.   Past Medical History:  Diagnosis Date   Anxiety    Carotid stenosis, asymptomatic, bilateral    Cataract    Chronic left shoulder pain    Headache    Hiatal hernia    small   Hyperlipidemia    Hypertension    Osteoporosis    Pre-diabetes    Schatzki's ring    Seasonal allergies    Stroke [facility])    Tremor     Past Surgical History:  Procedure Laterality Date   ABDOMINAL AORTOGRAM W/LOWER EXTREMITY N/A 07/11/2023   Procedure: ABDOMINAL AORTOGRAM W/LOWER EXTREMITY;  Surgeon: Adine Hoof, MD;  Location: [facility] INVASIVE CV LAB;  Service: Cardiovascular;  Laterality: N/A;   CARDIAC CATHETERIZATION  2005   "Ms. Zeigler has essentially normal coronary arteries and normal left ventricular function.  She will be treated empirically with anti-reflux measures"   CATARACT EXTRACTION, BILATERAL     CHOLECYSTECTOMY  COLONOSCOPY    06/16/2004   ZOX:WRUEAV rectum/Diminutive polyps of the splenic flexure and the cecum/The remainder of the colonic mucosa appeared normal pathology (hyperplastic polyps).   COLONOSCOPY N/A 08/07/2015   Dr. Riley Cheadle: 6 mm descending colon tubular adenoma, multiple medium-mouthed diverticula in sigmoid colon. surveillance 2022   COLONOSCOPY N/A 04/15/2021   Procedure: COLONOSCOPY;  Surgeon: Suzette Espy, MD;  Location: [facility] ENDO SU[facility];  Service: Endoscopy;  Laterality: N/A;  9:30am   ESOPHAGOGASTRODUODENOSCOPY  09/11/2001   WUJ:[id number] ring otherwise normal esophagus/Normal stomach/Normal D1 and D2  status post passage of a 56 French Maloney dilator   ESOPHAGOGASTRODUODENOSCOPY N/A 11/11/2017   Procedure: ESOPHAGOGASTRODUODENOSCOPY (EGD);  Surgeon: Suzette Espy, MD; mild Schatzki ring s/p dilation and mild erosive gastropathy.   ESOPHAGOGASTRODUODENOSCOPY (EGD) WITH ESOPHAGEAL DILATION N/A 01/25/2013   Dr. Riley Cheadle- schatzki's ring- 54 F dilation. small hiatal hernia   HYSTEROSCOPY WITH D & C N/A 03/06/2019   Procedure: DILATATION AND CURETTAGE /HYSTEROSCOPY;  Surgeon: Albino Hum, MD;  Location: [facility] ORS;  Service: Gynecology;  Laterality: N/A;   MALONEY DILATION N/A 11/11/2017   Procedure: Londa Rival DILATION;  Surgeon: Suzette Espy, MD;  Location: [facility];  Service: Endoscopy;  Laterality: N/A;   PERIPHERAL VASCULAR BALLOON ANGIOPLASTY Left 07/11/2023   Procedure: PERIPHERAL VASCULAR BALLOON ANGIOPLASTY;  Surgeon: Adine Hoof, MD;  Location: [facility] INVASIVE CV LAB;  Service: Cardiovascular;  Laterality: Left;  External Iliac   PERIPHERAL VASCULAR INTERVENTION Bilateral 07/11/2023   Procedure: PERIPHERAL VASCULAR INTERVENTION;  Surgeon: Adine Hoof, MD;  Location: [facility] INVASIVE CV LAB;  Service: Cardiovascular;  Laterality: Bilateral;  Bilateral Common Iliac; L External Iliac   POLYPECTOMY N/A 03/06/2019   Procedure: POLYPECTOMY ( REMOVAL OF ENDOMETRIAL POLYP);  Surgeon: Albino Hum, MD;  Location: [facility]S;  Service: Gynecology;  Laterality: N/A;   POLYPECTOMY  04/15/2021   Procedure: POLYPECTOMY;  Surgeon: Suzette Espy, MD;  Location[facility];  Service: Endoscopy;;   TUBAL LIGATION     VENA CAVA UMBRELLA NECK APPROACH N/A 07/26/2023   Procedure: INSERTION, UMBRELLA FILTER, INFERIOR VENA CAVA FEMORAL ARTERY;  Surgeon: Adine Hoof, MD;  Location: [facility] OR;  Service: Vascular;  Laterality: N/A;    Current Outpatient Medications  Medication Sig Dispense Refill   acetaminophen  (TYLENOL ) 500 MG tablet Take 500 mg by mouth every 6 (six) hours as  needed for moderate pain or headache.     amLODipine  (NORVASC ) 10 MG tablet TAKE 1 TABLET(10 MG) BY MOUTH AT BEDTIME 90 tablet 1   carvedilol  (COREG ) 3.125 MG tablet Take 2 tablets (6.25 mg total) by mouth 2 (two) times daily with a meal. 360 tablet 3   Cholecalciferol (VITAMIN D) 50 MCG (2000 UT) CAPS Take 2,000 Units by mouth daily.     cloNIDine  (CATAPRES ) 0.1 MG tablet Take 1 tablet (0.1 mg total) by mouth 3 (three) times daily as needed (180/100). 90 tablet 3   clopidogrel  (PLAVIX ) 75 MG tablet Take 1 tablet (75 mg total) by mouth daily. 90 tablet 0   Coenzyme Q10 (COQ10) 100 MG CAPS Take 100 mg by mouth daily.     CRANBERRY PO Take 1 each by mouth 2 (two) times a week. Chewables     Cyanocobalamin  (B-12) 2500 MCG TABS Take 2,500 mcg by mouth daily.     dexlansoprazole  (DEXILANT ) 60 MG capsule Take 1 capsule (60 mg total) by mouth daily. 30 capsule 11   EPINEPHrine  0.3 mg/0.3 mL IJ SOAJ injection Inject 0.3 mg  into the muscle as needed for anaphylaxis. 1 each 1   fluticasone  (FLONASE ) 50 MCG/ACT nasal spray Place 2 sprays into both nostrils daily as needed for allergies.     hydrochlorothiazide  (HYDRODIURIL ) 25 MG tablet Take 1 tablet (25 mg total) by mouth daily. 90 tablet 3   hydroquinone 4 % cream Apply 1 application topically 2 (two) times daily as needed (dark spots).     ipratropium (ATROVENT ) 0.03 % nasal spray INSTILL 2 SPRAYS IN EACH NOSTRIL EVERY 12 HOURS (Patient taking differently: Place 2 sprays into both nostrils 2 (two) times daily as needed for rhinitis.) 30 mL 12   loratadine  (CLARITIN ) 10 MG tablet Take 10 mg by mouth daily as needed for allergies.     MAGNESIUM  PO Take 330 mg by mouth daily.     rosuvastatin  (CRESTOR ) 20 MG tablet Take 1 tablet (20 mg total) by mouth daily. 90 tablet 3   sodium chloride  (OCEAN) 0.65 % SOLN nasal spray Place 1 spray into both nostrils as needed for congestion.     triamcinolone  cream (KENALOG ) 0.1 % Apply 1 Application topically 2 (two)  times daily as needed (irritation). 30 g 1   valsartan  (DIOVAN ) 320 MG tablet Take 1 tablet (320 mg total) by mouth daily. 90 tablet 3   vitamin E 400 UNIT capsule Take 400 Units by mouth 3 (three) times a week.     zinc gluconate 50 MG tablet Take 50 mg by mouth 3 (three) times a week.     Current Facility-Administered Medications  Medication Dose Route Frequency Provider Last Rate Last Admin   nitroGLYCERIN  (NITROSTAT ) SL tablet 0.4 mg  0.4 mg Sublingual Once         Allergies as of 10/13/2023 - Review Complete 10/13/2023  Allergen Reaction Noted   Ciprofloxacin Shortness Of Breath 12/27/2011   Latex Other (See Comments) 10/17/2019   Vitamin c [bioflavonoid products] Itching 04/09/2021    Family History  Problem Relation Age of Onset   Diabetes Other    Diabetes Mother    Heart disease Mother    Heart disease Father    Diabetes Sister    Diabetes Brother    Hypertension Brother    Diabetes Daughter    Hypertension Maternal Grandmother    Stroke Maternal Grandfather    Early death Paternal Grandfather    Colon cancer Neg Hx    Liver disease Neg Hx     Social History   Socioeconomic History   Marital status: Single    Spouse name: Not on file   Number of children: 3   Years of education: Not on file   Highest education level: 10th grade  Occupational History   Occupation: Public affairs consultant: [employer]  Tobacco Use   Smoking status: Former    Current packs/day: 0.00    Average packs/day: 0.3 packs/day for 35.0 years (8.8 ttl pk-yrs)    Types: Cigarettes    Start date: 12/31/1977    Quit date: 12/31/2012    Years since quitting: 10.7   Smokeless tobacco: Never  Vaping Use   Vaping status: Never Used  Substance and Sexual Activity   Alcohol use: Not Currently   Drug use: No   Sexual activity: Not Currently    Partners: Male    Birth control/protection: Post-menopausal, Surgical    Comment: tubal  Other Topics Concern   Not on file  Social  History Narrative   Eats all food groups.    Wears seatbelt.  Has 3 children.   Prior smoker.   Attends church.    Used to work in Designer, fashion/clothing.    Drives.   Enjoys going out with family.    Right handed   One story home   Social Drivers of Health   Financial Resource Strain: Low Risk  (06/02/2023)   Overall Financial Resource Strain (CARDIA)    Difficulty of Paying Living Expenses: Not very hard  Food Insecurity: No Food Insecurity (08/02/2023)   Hunger Vital Sign    Worried About Running Out of Food in the Last Year: Never true    Ran Out of Food in the Last Year: Never true  Transportation Needs: No Transportation Needs (08/02/2023)   PRAPARE - Administrator, Civil Service (Medical): No    Lack of Transportation (Non-Medical): No  Physical Activity: Sufficiently Active (06/02/2023)   Exercise Vital Sign    Days of Exercise per Week: 2 days    Minutes of Exercise per Session: 90 min  Recent Concern: Physical Activity - Insufficiently Active (03/18/2023)   Exercise Vital Sign    Days of Exercise per Week: 3 days    Minutes of Exercise per Session: 30 min  Stress: Stress Concern Present (06/02/2023)   [employer] of Occupational Health - Occupational Stress Questionnaire    Feeling of Stress : To some extent  Social Connections: Unknown (07/27/2023)   Social Connection and Isolation Panel [NHANES]    Frequency of Communication with Friends and Family: More than three times a week    Frequency of Social Gatherings with Friends and Family: Three times a week    Attends Religious Services: More than 4 times per year    Active Member of Clubs or Organizations: Yes    Attends Banker Meetings: More than 4 times per year    Marital Status: Patient declined  Intimate Partner Violence: Not At Risk (08/02/2023)   Humiliation, Afraid, Rape, and Kick questionnaire    Fear of Current or Ex-Partner: No    Emotionally Abused: No    Physically Abused: No     Sexually Abused: No     Review of Systems   Gen: Denies any fever, chills, fatigue, weight loss, lack of appetite.  CV: Denies chest pain, heart palpitations, peripheral edema, syncope.  Resp: Denies shortness of breath at rest or with exertion. Denies wheezing or cough.  GI: Denies dysphagia or odynophagia. Denies jaundice, hematemesis, fecal incontinence. GU : Denies urinary burning, urinary frequency, urinary hesitancy MS: Denies joint pain, muscle weakness, cramps, or limitation of movement.  Derm: Denies rash, itching, dry skin Psych: Denies depression, anxiety, memory loss, and confusion Heme: Denies bruising, bleeding, and enlarged lymph nodes.   Physical Exam   BP (!) 159/74 (BP Location: Right Arm, Patient Position: Sitting, Cuff Size: Normal)   Pulse 69   Temp 98.5 F (36.9 C) (Oral)   Ht 5\' 6"  (1.676 m)   Wt 156 lb 6.4 oz (70.9 kg)   SpO2 96%   BMI 25.24 kg/m  General:   Alert and oriented. Pleasant and cooperative. Well-nourished and well-developed.  Head:  Normocephalic and atraumatic. Eyes:  Without icterus Mouth: no oral thrush Abdomen:  +BS, soft, non-tender and non-distended. No HSM noted. No guarding or rebound. No masses appreciated.  Rectal:  Deferred  Msk:  Symmetrical without gross deformities. Normal posture. Extremities:  Without edema. Neurologic:  Alert and  oriented x4;  grossly normal neurologically. Skin:  Intact without significant lesions or rashes. Psych:  Alert and cooperative. Normal mood and affect.   Assessment   [name] is a 68 y.o. female presenting today with a history of  GERD, constipation, dysphagia, last seen in Oct 2024. She was last seen in Oct 2024 with plans for EGD/dilation; however, she had complicated medical course in interim with vascular  stenting of right common iliac artery and left common and external iliac arteries, stroke after procedure march 2025, developed DVT and subsequent occipital hemorrhage, with  IVC filter then placed. She is on Plavix  currently.   Dyspepsia: she is on Dexilant  but prefers pantoprazole  BID. I have sent this to pharmacy, as this has worked better for her in the past. Dyspepsia was improved on pantoprazole  historically. Abdominal pain does not seem consistent with chronic mesenteric ischemia, but she does have documented weight loss. Will review CTA with radiologist.  Excess saliva: unclear etiology. Query r/t recent stroke, possibly uncontrolled GERD.  Constipation: trulance  without improvement, linzess  with straining. Will trial Amitiza  24 mcg po BID.   Hx of adenomas in 2022 with 3 year surveillance due: can discuss this at next visit.   Postponing EGD at this point as she will need to have been on Plavix  at least 6 months uninterrupted if not longer and would favor avoiding endoscopic procedure so close to recent stroke.     PLAN  Stop Dexilant . Take pantoprazole  40 mg BID before meals Zofran  prn Trial of Amitiza  24 mcg po BID with food Will arrange colonoscopy/EGD at next visit Reviewing with radiologist the last CTA to comment on mesenteric vasculature 3 month follow-up   Delman Ferns, PhD, ANP-BC [facility]

## 2023-10-13 NOTE — Patient Instructions (Signed)
 Let's stop Dexilant . Instead, start pantoprazole  40 mg twice a day, 30 minutes before meals.   For constipation: I have sent in Amitiza  to take twice a day with meals. The biggest side effect with this could be nausea if not taken with food. You can start out taking this once a day and increase if tolerated.  For the excess saliva: reflux can cause this, so we will see how you do with the pantoprazole  twice a day. If continues, please reach out to primary care.  I am reviewing your CT scan with the radiologist!  I have sent in Zofran  to take for nausea every 8 hours as needed. Try to take sparingly, as it can cause constipation.   We will see you in 3 months!   It was a pleasure to see you today. I want to create trusting relationships with patients and provide genuine, compassionate, and quality care. I truly value your feedback, so please be on the lookout for a survey regarding your visit with me today. I appreciate your time in completing this!         Delman Ferns, PhD, ANP-BC [facility] Gastroenterology

## 2023-10-20 ENCOUNTER — Telehealth: Payer: Self-pay

## 2023-10-20 NOTE — Telephone Encounter (Signed)
 Prescription Request  10/20/2023  LOV: 09/15/23  What is the name of the medication or equipment? valsartan  (DIOVAN ) 320 MG tablet [[id number]]   Have you contacted your pharmacy to request a refill? Yes   Which pharmacy would you like this sent to?  [id number] - [place], [place] - [address] AT [facility] [address] [place] [address] Phone: [phone] Fax: [phone]    Patient notified that their request is being sent to the clinical staff for review and that they should receive a response within 2 business days.   Please advise at [facility] [phone]

## 2023-10-21 ENCOUNTER — Ambulatory Visit: Admitting: Family Medicine

## 2023-10-26 DIAGNOSIS — H53462 Homonymous bilateral field defects, left side: Secondary | ICD-10-CM | POA: Diagnosis not present

## 2023-10-27 ENCOUNTER — Encounter: Payer: Self-pay | Admitting: Physician Assistant

## 2023-11-23 DIAGNOSIS — J300 Vasomotor rhinitis: Secondary | ICD-10-CM | POA: Diagnosis not present

## 2023-11-23 DIAGNOSIS — J309 Allergic rhinitis, unspecified: Secondary | ICD-10-CM | POA: Diagnosis not present

## 2023-11-25 ENCOUNTER — Ambulatory Visit: Admitting: Family Medicine

## 2023-11-25 VITALS — BP 150/78 | HR 79 | Ht 66.0 in | Wt 155.4 lb

## 2023-11-25 DIAGNOSIS — I6521 Occlusion and stenosis of right carotid artery: Secondary | ICD-10-CM | POA: Diagnosis not present

## 2023-11-25 DIAGNOSIS — I10 Essential (primary) hypertension: Secondary | ICD-10-CM

## 2023-11-25 DIAGNOSIS — Z86718 Personal history of other venous thrombosis and embolism: Secondary | ICD-10-CM | POA: Diagnosis not present

## 2023-11-25 DIAGNOSIS — I739 Peripheral vascular disease, unspecified: Secondary | ICD-10-CM | POA: Diagnosis not present

## 2023-11-25 DIAGNOSIS — I6930 Unspecified sequelae of cerebral infarction: Secondary | ICD-10-CM

## 2023-11-25 DIAGNOSIS — I639 Cerebral infarction, unspecified: Secondary | ICD-10-CM

## 2023-11-25 NOTE — Progress Notes (Signed)
 Subjective:    Patient ID: [name], female    DOB: [birth year], 68 y.o.   MRN: [id number] Patient is here today for follow-up.  Complicated past medical history.  Patient was admitted to the hospital in March with peripheral artery disease and underwent bypass.  Suffered a periprocedural stroke.  Was treated with dual antiplatelet therapy.  He was also found to have a DVT and was started on Eliquis .  Was then readmitted to the hospital with tentative compression of her throat.  Patient had an IVC filter placed and Eliquis  was discontinued.  They also stopped her aspirin  and kept her on Plavix  for secondary prevention of stroke.  She is currently on Plavix  alone.  She is here today for follow-up.  Patient is currently on rosuvastatin  for secondary prevention of stroke in addition to her Plavix .  She also takes valsartan , amlodipine , hydrochlorothiazide , and carvedilol  for hypertension.  She is no longer having to use clonidine .  She only uses clonidine  occasionally when she has spikes of her blood pressure.  For the most part her systolic blood pressures remain 140/90 or less. Past Medical History:  Diagnosis Date   Anxiety    Carotid stenosis, asymptomatic, bilateral    Cataract    Chronic left shoulder pain    Headache    Hiatal hernia    small   Hyperlipidemia    Hypertension    Osteoporosis    Pre-diabetes    Schatzki's ring    Seasonal allergies    Stroke [facility])    Tremor      Past Surgical History:  Procedure Laterality Date   ABDOMINAL AORTOGRAM W/LOWER EXTREMITY N/A 07/11/2023   Procedure: ABDOMINAL AORTOGRAM W/LOWER EXTREMITY;  Surgeon: Sheree Penne Bruckner, MD;  Location: [facility] INVASIVE CV LAB;  Service: Cardiovascular;  Laterality: N/A;   CARDIAC CATHETERIZATION  2005   Ms. Criger has essentially normal coronary arteries and normal left ventricular function.  She will be treated empirically with anti-reflux measures   CATARACT EXTRACTION, BILATERAL      CHOLECYSTECTOMY     COLONOSCOPY    06/16/2004   MFM:Wnmfjo rectum/Diminutive polyps of the splenic flexure and the cecum/The remainder of the colonic mucosa appeared normal pathology (hyperplastic polyps).   COLONOSCOPY N/A 08/07/2015   Dr. Shaaron: 6 mm descending colon tubular adenoma, multiple medium-mouthed diverticula in sigmoid colon. surveillance 2022   COLONOSCOPY N/A 04/15/2021   Procedure: COLONOSCOPY;  Surgeon: Shaaron Lamar HERO, MD;  Location: [facility];  Service: Endoscopy;  Laterality: N/A;  9:30am   ESOPHAGOGASTRODUODENOSCOPY  09/11/2001   MFM:Dryjusxpd ring otherwise normal esophagus/Normal stomach/Normal D1 and D2 status post passage of a 56 French Maloney dilator   ESOPHAGOGASTRODUODENOSCOPY N/A 11/11/2017   Procedure: ESOPHAGOGASTRODUODENOSCOPY (EGD);  Surgeon: Shaaron Lamar HERO, MD; mild Schatzki ring s/p dilation and mild erosive gastropathy.   ESOPHAGOGASTRODUODENOSCOPY (EGD) WITH ESOPHAGEAL DILATION N/A 01/25/2013   Dr. Shaaron- schatzki's ring- 54 F dilation. small hiatal hernia   HYSTEROSCOPY WITH D & C N/A 03/06/2019   Procedure: DILATATION AND CURETTAGE /HYSTEROSCOPY;  Surgeon: Edsel Norleen GAILS, MD;  Location: [facility] ORS;  Service: Gynecology;  Laterality: N/A;   MALONEY DILATION N/A 11/11/2017   Procedure: AGAPITO DILATION;  Surgeon: Shaaron Lamar HERO, MD;  Location: [facility];  Service: Endoscopy;  Laterality: N/A;   PERIPHERAL VASCULAR BALLOON ANGIOPLASTY Left 07/11/2023   Procedure: PERIPHERAL VASCULAR BALLOON ANGIOPLASTY;  Surgeon: Sheree Penne Bruckner, MD;  Location[facility] INVASIVE CV LAB;  Service: Cardiovascular;  Laterality: Left;  External Iliac  PERIPHERAL VASCULAR INTERVENTION Bilateral 07/11/2023   Procedure: PERIPHERAL VASCULAR INTERVENTION;  Surgeon: Sheree Penne Bruckner, MD;  Location: [facility] INVASIVE CV LAB;  Service: Cardiovascular;  Laterality: Bilateral;  Bilateral Common Iliac; L External Iliac   POLYPECTOMY N/A 03/06/2019   Procedure: POLYPECTOMY ( REMOVAL OF  ENDOMETRIAL POLYP);  Surgeon: Edsel Norleen GAILS, MD;  Location: [facility];  Service: Gynecology;  Laterality: N/A;   POLYPECTOMY  04/15/2021   Procedure: POLYPECTOMY;  Surgeon: Shaaron Lamar HERO, MD;  Location: [facility];  Service: Endoscopy;;   TUBAL LIGATION     VENA CAVA UMBRELLA NECK APPROACH N/A 07/26/2023   Procedure: INSERTION, UMBRELLA FILTER, INFERIOR VENA CAVA FEMORAL ARTERY;  Surgeon: Sheree Penne Bruckner, MD;  Location: [facility] OR;  Service: Vascular;  Laterality: N/A;   Current Outpatient Medications on File Prior to Visit  Medication Sig Dispense Refill   acetaminophen  (TYLENOL ) 500 MG tablet Take 500 mg by mouth every 6 (six) hours as needed for moderate pain or headache.     amLODipine  (NORVASC ) 10 MG tablet TAKE 1 TABLET(10 MG) BY MOUTH AT BEDTIME 90 tablet 1   carvedilol  (COREG ) 3.125 MG tablet Take 2 tablets (6.25 mg total) by mouth 2 (two) times daily with a meal. 360 tablet 3   Cholecalciferol (VITAMIN D) 50 MCG (2000 UT) CAPS Take 2,000 Units by mouth daily.     cloNIDine  (CATAPRES ) 0.1 MG tablet Take 1 tablet (0.1 mg total) by mouth 3 (three) times daily as needed (180/100). 90 tablet 3   clopidogrel  (PLAVIX ) 75 MG tablet Take 1 tablet (75 mg total) by mouth daily. 90 tablet 0   Coenzyme Q10 (COQ10) 100 MG CAPS Take 100 mg by mouth daily.     CRANBERRY PO Take 1 each by mouth 2 (two) times a week. Chewables     Cyanocobalamin  (B-12) 2500 MCG TABS Take 2,500 mcg by mouth daily.     dexlansoprazole  (DEXILANT ) 60 MG capsule Take 1 capsule (60 mg total) by mouth daily. 30 capsule 11   EPINEPHrine  0.3 mg/0.3 mL IJ SOAJ injection Inject 0.3 mg into the muscle as needed for anaphylaxis. 1 each 1   fluticasone  (FLONASE ) 50 MCG/ACT nasal spray Place 2 sprays into both nostrils daily as needed for allergies.     hydrochlorothiazide  (HYDRODIURIL ) 25 MG tablet Take 1 tablet (25 mg total) by mouth daily. 90 tablet 3   hydroquinone 4 % cream Apply 1 application topically 2 (two) times  daily as needed (dark spots).     ipratropium (ATROVENT ) 0.03 % nasal spray INSTILL 2 SPRAYS IN EACH NOSTRIL EVERY 12 HOURS (Patient taking differently: Place 2 sprays into both nostrils 2 (two) times daily as needed for rhinitis.) 30 mL 12   loratadine  (CLARITIN ) 10 MG tablet Take 10 mg by mouth daily as needed for allergies.     lubiprostone  (AMITIZA ) 24 MCG capsule Take 1 capsule (24 mcg total) by mouth 2 (two) times daily with a meal. 60 capsule 3   MAGNESIUM  PO Take 330 mg by mouth daily.     ondansetron  (ZOFRAN ) 4 MG tablet Take 1 tablet (4 mg total) by mouth every 8 (eight) hours as needed for nausea or vomiting. 30 tablet 2   pantoprazole  (PROTONIX ) 40 MG tablet Take 1 tablet (40 mg total) by mouth 2 (two) times daily before a meal. 180 tablet 3   rosuvastatin  (CRESTOR ) 20 MG tablet Take 1 tablet (20 mg total) by mouth daily. 90 tablet 3   sodium chloride  (OCEAN) 0.65 % SOLN  nasal spray Place 1 spray into both nostrils as needed for congestion.     triamcinolone  cream (KENALOG ) 0.1 % Apply 1 Application topically 2 (two) times daily as needed (irritation). 30 g 1   valsartan  (DIOVAN ) 320 MG tablet Take 1 tablet (320 mg total) by mouth daily. 90 tablet 3   vitamin E 400 UNIT capsule Take 400 Units by mouth 3 (three) times a week.     zinc gluconate 50 MG tablet Take 50 mg by mouth 3 (three) times a week.     Current Facility-Administered Medications on File Prior to Visit  Medication Dose Route Frequency Provider Last Rate Last Admin   nitroGLYCERIN  (NITROSTAT ) SL tablet 0.4 mg  0.4 mg Sublingual Once             Allergies  Allergen Reactions   Ciprofloxacin Shortness Of Breath   Latex Other (See Comments)    Burns skin   Vitamin C [Bioflavonoid Products] Itching   Social History   Socioeconomic History   Marital status: Single    Spouse name: Not on file   Number of children: 3   Years of education: Not on file   Highest education level: 10th grade  Occupational History    Occupation: Public affairs consultant: [employer]  Tobacco Use   Smoking status: Former    Current packs/day: 0.00    Average packs/day: 0.3 packs/day for 35.0 years (8.8 ttl pk-yrs)    Types: Cigarettes    Start date: 12/31/1977    Quit date: 12/31/2012    Years since quitting: 10.9   Smokeless tobacco: Never  Vaping Use   Vaping status: Never Used  Substance and Sexual Activity   Alcohol use: Not Currently   Drug use: No   Sexual activity: Not Currently    Partners: Male    Birth control/protection: Post-menopausal, Surgical    Comment: tubal  Other Topics Concern   Not on file  Social History Narrative   Eats all food groups.    Wears seatbelt.    Has 3 children.   Prior smoker.   Attends church.    Used to work in Designer, fashion/clothing.    Drives.   Enjoys going out with family.    Right handed   One story home   Social Drivers of Health   Financial Resource Strain: Low Risk  (11/25/2023)   Overall Financial Resource Strain (CARDIA)    Difficulty of Paying Living Expenses: Not hard at all  Food Insecurity: No Food Insecurity (11/25/2023)   Hunger Vital Sign    Worried About Running Out of Food in the Last Year: Never true    Ran Out of Food in the Last Year: Never true  Transportation Needs: No Transportation Needs (11/25/2023)   PRAPARE - Administrator, Civil Service (Medical): No    Lack of Transportation (Non-Medical): No  Physical Activity: Insufficiently Active (11/25/2023)   Exercise Vital Sign    Days of Exercise per Week: 4 days    Minutes of Exercise per Session: 10 min  Stress: Stress Concern Present (11/25/2023)   [employer] of Occupational Health - Occupational Stress Questionnaire    Feeling of Stress: Rather much  Social Connections: Moderately Integrated (11/25/2023)   Social Connection and Isolation Panel    Frequency of Communication with Friends and Family: More than three times a week    Frequency of Social Gatherings with  Friends and Family: Once a week    Attends Religious Services:  More than 4 times per year    Active Member of Clubs or Organizations: Yes    Attends Banker Meetings: More than 4 times per year    Marital Status: Widowed  Intimate Partner Violence: Not At Risk (08/02/2023)   Humiliation, Afraid, Rape, and Kick questionnaire    Fear of Current or Ex-Partner: No    Emotionally Abused: No    Physically Abused: No    Sexually Abused: No   Family History  Problem Relation Age of Onset   Diabetes Other    Diabetes Mother    Heart disease Mother    Heart disease Father    Diabetes Sister    Diabetes Brother    Hypertension Brother    Diabetes Daughter    Hypertension Maternal Grandmother    Stroke Maternal Grandfather    Early death Paternal Grandfather    Colon cancer Neg Hx    Liver disease Neg Hx       Review of Systems  All other systems reviewed and are negative.      Objective:   Physical Exam Vitals reviewed.  Constitutional:      General: She is not in acute distress.    Appearance: Normal appearance. She is well-developed and normal weight. She is not ill-appearing.  HENT:     Right Ear: Tympanic membrane and ear canal normal.     Left Ear: Tympanic membrane and ear canal normal.     Nose: No congestion or rhinorrhea.     Right Sinus: No maxillary sinus tenderness or frontal sinus tenderness.     Left Sinus: No maxillary sinus tenderness or frontal sinus tenderness.  Eyes:     General: No visual field deficit.    Extraocular Movements: Extraocular movements intact.     Right eye: Normal extraocular motion and no nystagmus.     Left eye: Normal extraocular motion and no nystagmus.     Conjunctiva/sclera: Conjunctivae normal.     Pupils: Pupils are equal, round, and reactive to light.     Right eye: Pupil is round and reactive.     Left eye: Pupil is round and reactive.  Neck:     Vascular: No carotid bruit.  Cardiovascular:     Rate and Rhythm:  Normal rate and regular rhythm.     Heart sounds: Normal heart sounds. No murmur heard.    No friction rub. No gallop.  Pulmonary:     Effort: Pulmonary effort is normal. No respiratory distress.     Breath sounds: Normal breath sounds. No stridor. No wheezing or rales.  Abdominal:     General: Abdomen is flat. Bowel sounds are normal. There is no distension.     Palpations: Abdomen is soft.     Tenderness: There is no abdominal tenderness. There is no guarding.  Musculoskeletal:     Right hand: Normal. No swelling, deformity, tenderness or bony tenderness. Normal range of motion.     Left hand: Normal. No swelling, deformity, tenderness or bony tenderness. Normal range of motion.     Cervical back: Normal range of motion. No rigidity.     Right lower leg: Normal. No swelling, deformity, tenderness or bony tenderness. No edema.     Left lower leg: No deformity, tenderness or bony tenderness.     Right ankle: Normal. No swelling.     Left ankle: Normal. No swelling.  Lymphadenopathy:     Cervical: No cervical adenopathy.  Neurological:     General: No  focal deficit present.     Mental Status: She is alert and oriented to person, place, and time. Mental status is at baseline.     Cranial Nerves: No cranial nerve deficit, dysarthria or facial asymmetry.     Sensory: No sensory deficit.     Motor: Tremor present. No weakness or abnormal muscle tone.     Coordination: Romberg sign negative. Coordination normal.     Gait: Gait normal.  Psychiatric:        Mood and Affect: Mood normal.        Behavior: Behavior normal.        Thought Content: Thought content normal.        Judgment: Judgment normal.         Assessment & Plan:  Benign essential HTN - Plan: CBC with Differential/Platelet, Comprehensive metabolic panel with GFR, Lipid panel  Cerebrovascular accident (CVA), unspecified mechanism (HCC) - Plan: Lipid panel  PVD (peripheral vascular disease) (HCC)  Carotid stenosis,  asymptomatic, right  History of DVT (deep vein thrombosis) Patient continues to have an IVC filter.  She is scheduled to follow-up with vascular surgery to discuss removing the IVC filter later this summer.  This was done to prevent pulmonary embolism after the patient had to discontinue Eliquis  due to hemorrhagic conversion of her CVA.  She is currently on clopidogrel  for secondary prevention of stroke.  She will continue this for the present time although I will monitor her CBC to evaluate for any asymptomatic anemia.  Blood pressure is well-controlled.  I will check a fasting lipid panel.  Goal LDL cholesterol is less than 70.  Patient plans to follow-up with vascular surgery later this summer.  She had 30 to 50% bilateral carotid artery stenosis on CT angiogram earlier this year.

## 2023-11-26 LAB — COMPREHENSIVE METABOLIC PANEL WITH GFR
AG Ratio: 2 (calc) (ref 1.0–2.5)
ALT: 13 U/L (ref 6–29)
AST: 15 U/L (ref 10–35)
Albumin: 4.5 g/dL (ref 3.6–5.1)
Alkaline phosphatase (APISO): 54 U/L (ref 37–153)
BUN: 12 mg/dL (ref 7–25)
CO2: 27 mmol/L (ref 20–32)
Calcium: 9.6 mg/dL (ref 8.6–10.4)
Chloride: 95 mmol/L — ABNORMAL LOW (ref 98–110)
Creat: 0.93 mg/dL (ref 0.50–1.05)
Globulin: 2.3 g/dL (ref 1.9–3.7)
Glucose, Bld: 88 mg/dL (ref 65–99)
Potassium: 4.2 mmol/L (ref 3.5–5.3)
Sodium: 132 mmol/L — ABNORMAL LOW (ref 135–146)
Total Bilirubin: 0.7 mg/dL (ref 0.2–1.2)
Total Protein: 6.8 g/dL (ref 6.1–8.1)
eGFR: 67 mL/min/1.73m2 (ref >=60)

## 2023-11-26 LAB — CBC WITH DIFFERENTIAL/PLATELET
Absolute Lymphocytes: 2338 {cells}/uL (ref 850–3900)
Absolute Monocytes: 503 {cells}/uL (ref 200–950)
Basophils Absolute: 20 {cells}/uL (ref 0–200)
Basophils Relative: 0.3 %
Eosinophils Absolute: 174 {cells}/uL (ref 15–500)
Eosinophils Relative: 2.6 %
HCT: 34 % — ABNORMAL LOW (ref 35.0–45.0)
Hemoglobin: 11.3 g/dL — ABNORMAL LOW (ref 11.7–15.5)
MCH: 29.9 pg (ref 27.0–33.0)
MCHC: 33.2 g/dL (ref 32.0–36.0)
MCV: 89.9 fL (ref 80.0–100.0)
MPV: 9.4 fL (ref 7.5–12.5)
Monocytes Relative: 7.5 %
Neutro Abs: 3665 {cells}/uL (ref 1500–7800)
Neutrophils Relative %: 54.7 %
Platelets: 272 Thousand/uL (ref 140–400)
RBC: 3.78 Million/uL — ABNORMAL LOW (ref 3.80–5.10)
RDW: 12.5 % (ref 11.0–15.0)
Total Lymphocyte: 34.9 %
WBC: 6.7 Thousand/uL (ref 3.8–10.8)

## 2023-11-26 LAB — LIPID PANEL
Cholesterol: 164 mg/dL (ref <200)
HDL: 62 mg/dL (ref >=50)
LDL Cholesterol (Calc): 84 mg/dL
Non-HDL Cholesterol (Calc): 102 mg/dL (ref <130)
Total CHOL/HDL Ratio: 2.6 (calc) (ref <5.0)
Triglycerides: 89 mg/dL (ref <150)

## 2023-11-28 ENCOUNTER — Other Ambulatory Visit: Payer: Self-pay

## 2023-11-28 ENCOUNTER — Ambulatory Visit: Payer: Self-pay | Admitting: Family Medicine

## 2023-11-28 MED ORDER — ROSUVASTATIN CALCIUM 40 MG PO TABS: 40.0000 mg | ORAL_TABLET | Freq: Every day | ORAL | 3 refills | Status: DC

## 2023-11-28 NOTE — Progress Notes (Signed)
 No contact made on phone attempt. Lvm informing of my chart result letter sent for instructions, and new medication ordered.

## 2023-11-29 ENCOUNTER — Telehealth: Payer: Self-pay | Admitting: Family Medicine

## 2023-11-29 NOTE — Telephone Encounter (Signed)
 FYI Only or Action Required?: Action required by provider: patient stated she was put on medication for BP and was told not to take her xanax  and pt would like to know if theres a different medication she can take for her anxiety. Please give pt a callback with more information .  Patient was last seen in primary care on 11/25/2023 by Duanne Butler DASEN, MD.  Called Nurse Triage reporting No chief complaint on file..  Symptoms began today.  Interventions attempted: Nothing.  Symptoms are: stable.  Triage Disposition: No disposition on file.  Patient/caregiver understands and will follow disposition?:         Copied from CRM #[id number]. Topic: Clinical - Medication Question >> Nov 29, 2023  1:46 PM Avram MATSU wrote: Reason for CRM: patient stated she was put on medication for BP and was told not to take her xanax  and pt would like to know if theres a different medication she can take for her anxiety. Please give pt a callback with more information   [phone]

## 2023-11-30 ENCOUNTER — Other Ambulatory Visit: Payer: Self-pay | Admitting: Family Medicine

## 2023-11-30 MED ORDER — ALPRAZOLAM 0.5 MG PO TABS: 0.5000 mg | ORAL_TABLET | Freq: Two times a day (BID) | ORAL | 0 refills | Status: DC | PRN

## 2023-12-05 ENCOUNTER — Encounter (HOSPITAL_COMMUNITY): Payer: Self-pay

## 2023-12-05 ENCOUNTER — Emergency Department (HOSPITAL_COMMUNITY)

## 2023-12-05 ENCOUNTER — Other Ambulatory Visit: Payer: Self-pay

## 2023-12-05 ENCOUNTER — Emergency Department (HOSPITAL_COMMUNITY)
Admission: EM | Admit: 2023-12-05 | Discharge: 2023-12-06 | Disposition: A | Discharge status: Home / Self Care | Attending: Emergency Medicine | Admitting: Emergency Medicine

## 2023-12-05 DIAGNOSIS — R202 Paresthesia of skin: Secondary | ICD-10-CM | POA: Diagnosis not present

## 2023-12-05 DIAGNOSIS — R519 Headache, unspecified: Secondary | ICD-10-CM

## 2023-12-05 DIAGNOSIS — Z91040 Latex allergy status: Secondary | ICD-10-CM | POA: Insufficient documentation

## 2023-12-05 DIAGNOSIS — I10 Essential (primary) hypertension: Secondary | ICD-10-CM | POA: Insufficient documentation

## 2023-12-05 DIAGNOSIS — Z79899 Other long term (current) drug therapy: Secondary | ICD-10-CM | POA: Diagnosis not present

## 2023-12-05 DIAGNOSIS — R29818 Other symptoms and signs involving the nervous system: Secondary | ICD-10-CM | POA: Diagnosis not present

## 2023-12-05 LAB — BASIC METABOLIC PANEL WITH GFR
Anion gap: 11 (ref 5–15)
BUN: 9 mg/dL (ref 8–23)
CO2: 27 mmol/L (ref 22–32)
Calcium: 9.3 mg/dL (ref 8.9–10.3)
Chloride: 97 mmol/L — ABNORMAL LOW (ref 98–111)
Creatinine, Ser: 0.87 mg/dL (ref 0.44–1.00)
GFR, Estimated: >60 mL/min (ref >60)
Glucose, Bld: 115 mg/dL — ABNORMAL HIGH (ref 70–99)
Potassium: 3.7 mmol/L (ref 3.5–5.1)
Sodium: 135 mmol/L (ref 135–145)

## 2023-12-05 LAB — CBC WITH DIFFERENTIAL/PLATELET
Abs Immature Granulocytes: 0.02 K/uL (ref 0.00–0.07)
Basophils Absolute: 0 K/uL (ref 0.0–0.1)
Basophils Relative: 1 %
Eosinophils Absolute: 0.2 K/uL (ref 0.0–0.5)
Eosinophils Relative: 2 %
HCT: 35.2 % — ABNORMAL LOW (ref 36.0–46.0)
Hemoglobin: 11.7 g/dL — ABNORMAL LOW (ref 12.0–15.0)
Immature Granulocytes: 0 %
Lymphocytes Relative: 29 %
Lymphs Abs: 2.1 K/uL (ref 0.7–4.0)
MCH: 29.3 pg (ref 26.0–34.0)
MCHC: 33.2 g/dL (ref 30.0–36.0)
MCV: 88.2 fL (ref 80.0–100.0)
Monocytes Absolute: 0.5 K/uL (ref 0.1–1.0)
Monocytes Relative: 8 %
Neutro Abs: 4.2 K/uL (ref 1.7–7.7)
Neutrophils Relative %: 60 %
Platelets: 327 K/uL (ref 150–400)
RBC: 3.99 MIL/uL (ref 3.87–5.11)
RDW: 12.8 % (ref 11.5–15.5)
WBC: 7 K/uL (ref 4.0–10.5)
nRBC: 0 % (ref 0.0–0.2)

## 2023-12-05 LAB — TROPONIN I (HIGH SENSITIVITY): Troponin I (High Sensitivity): 9 ng/L (ref <18)

## 2023-12-05 LAB — MAGNESIUM: Magnesium: 2.2 mg/dL (ref 1.7–2.4)

## 2023-12-05 MED ORDER — LORAZEPAM 1 MG PO TABS
1.0000 mg | ORAL_TABLET | Freq: Once | ORAL | Status: AC
  Administered 2023-12-05: 1 mg via ORAL
  Filled 2023-12-05: qty 1

## 2023-12-05 NOTE — ED Provider Notes (Signed)
  EMERGENCY DEPARTMENT AT [facility] Provider Note   CSN: [id number] Arrival date & time: 12/05/23  1842     Patient presents with: Headache and Hypertension   [name] is a 68 y.o. female here with headache and high blood pressure at home.  She has been having intermittent pressure headaches on the top of her head for a few weeks, at random.  Last night she had an intense one and noted her left fingers were tingling.  She has a history of stroke, takes plavex only (not aspirin  due to bleeding risk).  She reports she took clonidine  with her elevated BP and it's come down, now her headache is gone and she feels normal.   HPI     Prior to Admission medications   Medication Sig Start Date End Date Taking? Authorizing Provider  acetaminophen  (TYLENOL ) 500 MG tablet Take 500 mg by mouth every 6 (six) hours as needed for moderate pain or headache.    [provider]  ALPRAZolam  (XANAX ) 0.5 MG tablet Take 1 tablet (0.5 mg total) by mouth 2 (two) times daily as needed for anxiety. 11/30/23   Duanne Butler DASEN, MD  amLODipine  (NORVASC ) 10 MG tablet TAKE 1 TABLET(10 MG) BY MOUTH AT BEDTIME 10/10/23   Duanne Butler DASEN, MD  carvedilol  (COREG ) 3.125 MG tablet Take 2 tablets (6.25 mg total) by mouth 2 (two) times daily with a meal. 09/15/23   Duanne Butler DASEN, MD  Cholecalciferol (VITAMIN D) 50 MCG (2000 UT) CAPS Take 2,000 Units by mouth daily.    [provider]  cloNIDine  (CATAPRES ) 0.1 MG tablet Take 1 tablet (0.1 mg total) by mouth 3 (three) times daily as needed (180/100). 08/02/23   Duanne Butler DASEN, MD  clopidogrel  (PLAVIX ) 75 MG tablet Take 1 tablet (75 mg total) by mouth daily. 07/28/23   Lehner, Erin C, NP  Coenzyme Q10 (COQ10) 100 MG CAPS Take 100 mg by mouth daily.    [provider]  CRANBERRY PO Take 1 each by mouth 2 (two) times a week. Chewables    [provider]  Cyanocobalamin  (B-12) 2500 MCG TABS Take 2,500 mcg by mouth  daily.    [provider]  dexlansoprazole  (DEXILANT ) 60 MG capsule Take 1 capsule (60 mg total) by mouth daily. 05/18/22   [name], Lamar HERO, MD  EPINEPHrine  0.3 mg/0.3 mL IJ SOAJ injection Inject 0.3 mg into the muscle as needed for anaphylaxis. 03/12/22   Duanne Butler DASEN, MD  fluticasone  (FLONASE ) 50 MCG/ACT nasal spray Place 2 sprays into both nostrils daily as needed for allergies. 01/23/23   [provider]  hydrochlorothiazide  (HYDRODIURIL ) 25 MG tablet Take 1 tablet (25 mg total) by mouth daily. 08/19/23   Duanne Butler DASEN, MD  hydroquinone 4 % cream Apply 1 application topically 2 (two) times daily as needed (dark spots). 01/05/21   [provider]  ipratropium (ATROVENT ) 0.03 % nasal spray INSTILL 2 SPRAYS IN EACH NOSTRIL EVERY 12 HOURS Patient taking differently: Place 2 sprays into both nostrils 2 (two) times daily as needed for rhinitis. 04/17/21   Duanne Butler DASEN, MD  loratadine  (CLARITIN ) 10 MG tablet Take 10 mg by mouth daily as needed for allergies.    [provider]  lubiprostone  (AMITIZA ) 24 MCG capsule Take 1 capsule (24 mcg total) by mouth 2 (two) times daily with a meal. 10/13/23   Shirlean Therisa ORN, NP  MAGNESIUM  PO Take 330 mg by mouth daily.    [provider]  ondansetron  (ZOFRAN ) 4 MG tablet Take 1 tablet (4 mg total) by mouth every 8 (eight) hours as needed for nausea or vomiting. 10/13/23   Shirlean Therisa ORN, NP  pantoprazole  (PROTONIX ) 40 MG tablet Take 1 tablet (40 mg total) by mouth 2 (two) times daily before a meal. 10/13/23   Shirlean Therisa ORN, NP  rosuvastatin  (CRESTOR ) 40 MG tablet Take 1 tablet (40 mg total) by mouth daily. 11/28/23   Duanne Butler DASEN, MD  sodium chloride  (OCEAN) 0.65 % SOLN nasal spray Place 1 spray into both nostrils as needed for congestion.    [provider]  triamcinolone  cream (KENALOG ) 0.1 % Apply 1 Application topically 2 (two) times daily as needed (irritation). 09/21/23   Duanne Butler DASEN, MD  valsartan   (DIOVAN ) 320 MG tablet Take 1 tablet (320 mg total) by mouth daily. 09/15/23   Duanne Butler DASEN, MD  vitamin E 400 UNIT capsule Take 400 Units by mouth 3 (three) times a week.    [provider]  zinc gluconate 50 MG tablet Take 50 mg by mouth 3 (three) times a week.    [provider]    Allergies: Ciprofloxacin, Latex, and Vitamin c [bioflavonoid products]    Review of Systems  Updated Vital Signs BP (!) 143/57   Pulse (!) 59   Temp 98 F (36.7 C) (Oral)   Resp 16   Ht 5' 6 (1.676 m)   Wt 69.4 kg   SpO2 100%   BMI 24.69 kg/m   Physical Exam Constitutional:      General: She is not in acute distress. HENT:     Head: Normocephalic and atraumatic.  Eyes:     Conjunctiva/sclera: Conjunctivae normal.     Pupils: Pupils are equal, round, and reactive to light.  Cardiovascular:     Rate and Rhythm: Normal rate and regular rhythm.  Pulmonary:     Effort: Pulmonary effort is normal. No respiratory distress.  Abdominal:     General: There is no distension.     Tenderness: There is no abdominal tenderness.  Skin:    General: Skin is warm and dry.  Neurological:     General: No focal deficit present.     Mental Status: She is alert and oriented to person, place, and time. Mental status is at baseline.  Psychiatric:        Mood and Affect: Mood normal.        Behavior: Behavior normal.     (all labs ordered are listed, but only abnormal results are displayed) Labs Reviewed  CBC WITH DIFFERENTIAL/PLATELET - Abnormal; Notable for the following components:      Result Value   Hemoglobin 11.7 (*)    HCT 35.2 (*)    All other components within normal limits  BASIC METABOLIC PANEL WITH GFR - Abnormal; Notable for the following components:   Chloride 97 (*)    Glucose, Bld 115 (*)    All other components within normal limits  MAGNESIUM   TROPONIN I (HIGH SENSITIVITY)    EKG: EKG Interpretation Date/Time:  Monday December 05 2023 20:12:40 EDT Ventricular  Rate:  64 PR Interval:  164 QRS Duration:  86 QT Interval:  446 QTC Calculation: 460 R Axis:   23  Text Interpretation: Normal sinus rhythm When compared with ECG of 07-Aug-2023 20:20, PREVIOUS ECG IS PRESENT Confirmed by Cottie Cough [phone]) on 12/05/2023 9:40:21 PM  Radiology: CT Head Wo Contrast Result Date: 12/05/2023 CLINICAL DATA:  Headache EXAM:  CT HEAD WITHOUT CONTRAST TECHNIQUE: Contiguous axial images were obtained from the base of the skull through the vertex without intravenous contrast. RADIATION DOSE REDUCTION: This exam was performed according to the departmental dose-optimization program which includes automated exposure control, adjustment of the mA and/or kV according to patient size and/or use of iterative reconstruction technique. COMPARISON:  08/01/2023 FINDINGS: Brain: Interval evolution of previous identified right PCA territory infarct with cortical encephalomalacia noted. Subacute to remote right temporoparietal subcortical white matter infarct (20/2) without significant associated mass effect or superimposed acute intracranial hemorrhage. No abnormal mass effect or midline shift. No intra or extra-axial mass lesion. Ventricular size is normal. Cerebellum is. Vascular: No hyperdense vessel or unexpected calcification. Skull: Normal. Negative for fracture or focal lesion. Sinuses/Orbits: Mild mucosal thickening within the ethmoid air cells bilaterally. No air-fluid levels. Orbits are unremarkable. Other: Mastoid air cells and middle ear cavities are clear. IMPRESSION: 1. Subacute to remote right temporoparietal subcortical white matter infarct without significant associated mass effect or superimposed acute intracranial hemorrhage. 2. Interval evolution of previously identified right PCA territory infarct with cortical encephalomalacia now noted. 3. Mild ethmoid sinus disease. Electronically Signed   By: Dorethia Molt M.D.   On: 12/05/2023 21:46     Procedures    Medications Ordered in the ED  LORazepam  (ATIVAN ) tablet 1 mg (1 mg Oral Given 12/05/23 2247)    Clinical Course as of 12/05/23 2347  Mon Dec 05, 2023  2231 Anxiolytic ordered for MRI per request [MT]    Clinical Course User Index [MT] Caellum Mancil, Donnice PARAS, MD                                 Medical Decision Making Amount and/or Complexity of Data Reviewed Radiology: ordered.  Risk Prescription drug management.   This patient presents to the ED with concern for headache, tingling hand. This involves an extensive number of treatment options, and is a complaint that carries with it a high risk of complications and morbidity.  The differential diagnosis includes hypertensive urgency vs CVA vs recrudescence vs other  Co-morbidities that complicate the patient evaluation: CV and stroke risk factors  Additional history obtained from family/daughter  External records from outside source obtained and reviewed including discharge summary March 2025 after admission for hemorrhagic transfomration of recent PCA infarct.  Pt had been on eliquis , aspirin  and plavex before but continued only on Plavex due to lower extremity stents.  She is no longer on eliquis   I ordered and personally interpreted labs.  The pertinent results include:  no emergent findings  I ordered imaging studies including CT head I independently visualized and interpreted imaging which showed  IMPRESSION:  1. Subacute to remote right temporoparietal subcortical white matter  infarct without significant associated mass effect or superimposed  acute intracranial hemorrhage.  2. Interval evolution of previously identified right PCA territory  infarct with cortical encephalomalacia now noted.  3. Mild ethmoid sinus disease.   I agree with the radiologist interpretation   Per my interpretation the patient's ECG shows no acute ischemic findings  I ordered medication including ativan  for anxiolysis I have reviewed the  patients home medicines and have made adjustments as needed  Test Considered: doubt meningitis, ACS, acute PE  Disposition:  The patient is signed out to overnight ED provider pending follow up on MRI imaging.  If concern for subacute stroke, likely will need discussion with neurologist regarding medical optimization.  If no acute stroke, anticipate discharge home - patient otherwise asymptomatic at this time.      Final diagnoses:  Hypertension, unspecified type  Nonintractable headache, unspecified chronicity pattern, unspecified headache type    ED Discharge Orders     None          Cottie Donnice PARAS, MD 12/05/23 [phone]

## 2023-12-05 NOTE — ED Triage Notes (Signed)
 Pt states that she had a bad headache earlier with nausea, and her BP was high at 198 systolic, took a clonidine , headache has got better. Pt has a hx of a stroke. Grips equal bilaterally

## 2023-12-05 NOTE — ED Provider Triage Note (Signed)
 Emergency Medicine Provider Triage Evaluation Note  [name] , a 68 y.o. female  was evaluated in triage.  Pt complains of headache, significantly elevated blood pressure.  Headache has been progressively worsening.  Also endorses shortness of breath.  Review of Systems  Positive: As above Negative: As above  Physical Exam  BP (!) 146/81   Pulse 74   Temp 98 F (36.7 C) (Oral)   Resp 20   SpO2 100%  Gen:   Awake, no distress   Resp:  Normal effort  MSK:   Moves extremities without difficulty  Other:    Medical Decision Making  Medically screening exam initiated at 7:49 PM.  Appropriate orders placed.  [name] was informed that the remainder of the evaluation will be completed by another provider, this initial triage assessment does not replace that evaluation, and the importance of remaining in the ED until their evaluation is complete.     Hildegard Loge, PA-C 12/05/23 1950

## 2023-12-05 NOTE — ED Notes (Signed)
 Pt taken to MRI

## 2023-12-06 ENCOUNTER — Ambulatory Visit: Payer: Self-pay

## 2023-12-06 DIAGNOSIS — R29818 Other symptoms and signs involving the nervous system: Secondary | ICD-10-CM | POA: Diagnosis not present

## 2023-12-06 DIAGNOSIS — R519 Headache, unspecified: Secondary | ICD-10-CM | POA: Diagnosis not present

## 2023-12-06 DIAGNOSIS — R202 Paresthesia of skin: Secondary | ICD-10-CM | POA: Diagnosis not present

## 2023-12-06 NOTE — ED Provider Notes (Signed)
 12:14 AM Assumed care from Dr. Cottie, please see their note for full history, physical and decision making until this point. In brief this is a 68 y.o. year old female who presented to the ED tonight with Headache and Hypertension     Some paresthesias, h/o ICH, CT w/ possible subacute infarct, pending MRI. Only on plavix .   MRI negative. Patient feels well and at neurologic baseline. D/c with son with outpatient neuro follow up.  Discharge instructions, including strict return precautions for new or worsening symptoms, given. Patient and/or family verbalized understanding and agreement with the plan as described.   Labs, studies and imaging reviewed by myself and considered in medical decision making if ordered. Imaging interpreted by radiology.  Labs Reviewed  CBC WITH DIFFERENTIAL/PLATELET - Abnormal; Notable for the following components:      Result Value   Hemoglobin 11.7 (*)    HCT 35.2 (*)    All other components within normal limits  BASIC METABOLIC PANEL WITH GFR - Abnormal; Notable for the following components:   Chloride 97 (*)    Glucose, Bld 115 (*)    All other components within normal limits  MAGNESIUM   TROPONIN I (HIGH SENSITIVITY)    CT Head Wo Contrast  Final Result    MR BRAIN WO CONTRAST    (Results Pending)    No follow-ups on file.    Maikol Grassia, Selinda, MD 12/06/23 [phone]

## 2023-12-06 NOTE — ED Notes (Signed)
 Pt back from MRI

## 2023-12-06 NOTE — Telephone Encounter (Signed)
 Copied from CRM [id number]. Topic:  FYI Only or Action Required?: Action required by provider: clinical question for provider.  Patient was last seen in primary care on 11/25/2023 by Duanne Butler DASEN, MD.  Called Nurse Triage reporting Advice Only.  Symptoms began today.  Interventions attempted: Nothing.  Symptoms are: stable.  Triage Disposition: Call PCP When Office is Open  Patient/caregiver understands and will follow disposition?: Yes    Clinical - Red Word Triage >> Dec 06, 2023  8:11 AM Myrick T wrote: [facility] that prompted transfer to Nurse Triage: patients daughter Woodie called stated mom took her night and day time meds within a 40 min time frame (4) carvedilol  (COREG ) 3.125 MG tablet, (1) hydrochlorothiazide  (HYDRODIURIL ) 25 MG tablet, (1) valsartan  (DIOVAN ) 320 MG tablet, (1) amLODipine  (NORVASC ) 10 MG tablet and (1) clopidogrel  (PLAVIX ) 75 MG tablet. Patient said she does not feel anything out of the norm as of right now but her blood pressure is 144/64 Reason for Disposition  [1] Caller requesting NON-URGENT health information AND [2] PCP's office is the best resource    Routing encounter to PCP  Answer Assessment - Initial Assessment Questions 1. REASON FOR CALL: What is the main reason for your call? or How can I best help you?     Pt & pt's daughter called to inform pt accidentally took all medications for today, which includes day and night time, medication.  Pt is concerned of how this would affect her.  Pt took BP 144/64 immediately after she realized she took day and night medications.  Pt would like a call back to advice on how to move forward with medications and pt health at present time.  Pt is not having any s/s at present.  Protocols used: Information Only Call - No Triage-A-AH

## 2023-12-08 ENCOUNTER — Other Ambulatory Visit: Payer: Self-pay

## 2023-12-08 DIAGNOSIS — I739 Peripheral vascular disease, unspecified: Secondary | ICD-10-CM

## 2024-01-04 ENCOUNTER — Ambulatory Visit (HOSPITAL_COMMUNITY)
Admission: RE | Admit: 2024-01-04 | Discharge: 2024-01-04 | Disposition: A | Discharge status: Home / Self Care | Source: Ambulatory Visit | Attending: Vascular Surgery | Admitting: Vascular Surgery

## 2024-01-04 ENCOUNTER — Ambulatory Visit: Attending: Vascular Surgery | Admitting: Physician Assistant

## 2024-01-04 VITALS — BP 130/61 | HR 64 | Temp 97.7°F | Wt 155.1 lb

## 2024-01-04 DIAGNOSIS — Z95828 Presence of other vascular implants and grafts: Secondary | ICD-10-CM

## 2024-01-04 DIAGNOSIS — I739 Peripheral vascular disease, unspecified: Secondary | ICD-10-CM | POA: Diagnosis not present

## 2024-01-04 LAB — VAS US ABI WITH/WO TBI
Left ABI: 0.84
Right ABI: 0.66

## 2024-01-04 NOTE — Progress Notes (Signed)
 HISTORY AND PHYSICAL     CC:  follow up. Requesting Provider:  Duanne Butler DASEN, MD  HPI: This is a 68 y.o. female who is here today for follow up for PAD.  Pt has hx of  carotid artery stenosis and PAD.  She was thought to have a small AAA, however, CT scan revealed that she did not have AAA.  She also has hx of varicose veins.   On 07/11/2023, she underwent angiogram with stents of bilateral CIA and left EIA by Dr. 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 .   On 07/26/2023, she had an IVC filter placed for DVT with contraindication of anticoagulation also by Dr. Sheree.  Pt was last seen 08/31/2023 and at that time, she did have some vision disturbance from her stroke but was resolving.  She was walking without limitation and only had residual numbness in the right great toe.  Her headaches were also improving.  She was on Plavix  and her AC she was on for DVT was being held.  CTA head/neck 08/01/2023 revealed about 50% right ICA stenosis and no stenosis on the left.   The pt returns today for follow up and here with her daughter.  Pt denies any rest pain, non healing wounds or claudication.  She continues to have some numbness and tingling in the right great toe.  Overall, she is getting better from her stroke.  She denies any hx of DVT in the past.    The pt is on a statin for cholesterol management.    The pt is not on an aspirin .    Other AC:  Plavix  The pt is on BB, clonidine , ARB for hypertension.  The pt is not on diabetic medication. Tobacco hx:  former  Pt does not have family hx of AAA.  Past Medical History:  Diagnosis Date   Anxiety    Carotid stenosis, asymptomatic, bilateral    Cataract    Chronic left shoulder pain    Headache    Hiatal hernia    small   Hyperlipidemia    Hypertension    Osteoporosis    Pre-diabetes    Schatzki's ring    Seasonal allergies    Stroke  [facility])    Tremor     Past Surgical History:  Procedure Laterality Date   ABDOMINAL AORTOGRAM W/LOWER EXTREMITY N/A 07/11/2023   Procedure: ABDOMINAL AORTOGRAM W/LOWER EXTREMITY;  Surgeon: Sheree Penne Bruckner, MD;  Location: [facility] INVASIVE CV LAB;  Service: Cardiovascular;  Laterality: N/A;   CARDIAC CATHETERIZATION  2005   Ms. Eccleston has essentially normal coronary arteries and normal left ventricular function.  She will be treated empirically with anti-reflux measures   CATARACT EXTRACTION, BILATERAL     CHOLECYSTECTOMY     COLONOSCOPY    06/16/2004   MFM:Wnmfjo rectum/Diminutive polyps of the splenic flexure and the cecum/The remainder of the colonic mucosa appeared normal pathology (hyperplastic polyps).   COLONOSCOPY N/A 08/07/2015   Dr. Shaaron: 6 mm descending colon tubular adenoma, multiple medium-mouthed diverticula in sigmoid colon. surveillance 2022   COLONOSCOPY N/A 04/15/2021   Procedure: COLONOSCOPY;  Surgeon: Shaaron Lamar HERO, MD;  Location: [facility];  Service: Endoscopy;  Laterality: N/A;  9:30am   ESOPHAGOGASTRODUODENOSCOPY  09/11/2001   MFM:Dryjusxpd ring otherwise normal esophagus/Normal stomach/Normal D1 and D2 status post passage of a 56 French Maloney dilator   ESOPHAGOGASTRODUODENOSCOPY N/A 11/11/2017   Procedure:  ESOPHAGOGASTRODUODENOSCOPY (EGD);  Surgeon: Shaaron Lamar HERO, MD; mild Schatzki ring s/p dilation and mild erosive gastropathy.   ESOPHAGOGASTRODUODENOSCOPY (EGD) WITH ESOPHAGEAL DILATION N/A 01/25/2013   Dr. Shaaron- schatzki's ring- 54 F dilation. small hiatal hernia   HYSTEROSCOPY WITH D & C N/A 03/06/2019   Procedure: DILATATION AND CURETTAGE /HYSTEROSCOPY;  Surgeon: Edsel Norleen GAILS, MD;  Location: [facility] ORS;  Service: Gynecology;  Laterality: N/A;   MALONEY DILATION N/A 11/11/2017   Procedure: AGAPITO DILATION;  Surgeon: Shaaron Lamar HERO, MD;  Location: [facility];  Service: Endoscopy;  Laterality: N/A;   PERIPHERAL VASCULAR BALLOON ANGIOPLASTY Left  07/11/2023   Procedure: PERIPHERAL VASCULAR BALLOON ANGIOPLASTY;  Surgeon: Sheree Penne Bruckner, MD;  Location: [facility] INVASIVE CV LAB;  Service: Cardiovascular;  Laterality: Left;  External Iliac   PERIPHERAL VASCULAR INTERVENTION Bilateral 07/11/2023   Procedure: PERIPHERAL VASCULAR INTERVENTION;  Surgeon: Sheree Penne Bruckner, MD;  Location: [facility] INVASIVE CV LAB;  Service: Cardiovascular;  Laterality: Bilateral;  Bilateral Common Iliac; L External Iliac   POLYPECTOMY N/A 03/06/2019   Procedure: POLYPECTOMY ( REMOVAL OF ENDOMETRIAL POLYP);  Surgeon: Edsel Norleen GAILS, MD;  Location: [facility];  Service: Gynecology;  Laterality: N/A;   POLYPECTOMY  04/15/2021   Procedure: POLYPECTOMY;  Surgeon: Shaaron Lamar HERO, MD;  Location: [facility];  Service: Endoscopy;;   TUBAL LIGATION     VENA CAVA UMBRELLA NECK APPROACH N/A 07/26/2023   Procedure: INSERTION, UMBRELLA FILTER, INFERIOR VENA CAVA FEMORAL ARTERY;  Surgeon: Sheree Penne Bruckner, MD;  Location: [facility] OR;  Service: Vascular;  Laterality: N/A;    Allergies  Allergen Reactions   Ciprofloxacin Shortness Of Breath   Latex Other (See Comments)    Burns skin   Vitamin C [Bioflavonoid Products] Itching    Current Outpatient Medications  Medication Sig Dispense Refill   acetaminophen  (TYLENOL ) 500 MG tablet Take 500 mg by mouth every 6 (six) hours as needed for moderate pain or headache.     ALPRAZolam  (XANAX ) 0.5 MG tablet Take 1 tablet (0.5 mg total) by mouth 2 (two) times daily as needed for anxiety. 30 tablet 0   amLODipine  (NORVASC ) 10 MG tablet TAKE 1 TABLET(10 MG) BY MOUTH AT BEDTIME 90 tablet 1   carvedilol  (COREG ) 3.125 MG tablet Take 2 tablets (6.25 mg total) by mouth 2 (two) times daily with a meal. 360 tablet 3   Cholecalciferol (VITAMIN D) 50 MCG (2000 UT) CAPS Take 2,000 Units by mouth daily.     cloNIDine  (CATAPRES ) 0.1 MG tablet Take 1 tablet (0.1 mg total) by mouth 3 (three) times daily as needed (180/100). 90 tablet 3    clopidogrel  (PLAVIX ) 75 MG tablet Take 1 tablet (75 mg total) by mouth daily. 90 tablet 0   Coenzyme Q10 (COQ10) 100 MG CAPS Take 100 mg by mouth daily.     CRANBERRY PO Take 1 each by mouth 2 (two) times a week. Chewables     Cyanocobalamin  (B-12) 2500 MCG TABS Take 2,500 mcg by mouth daily.     dexlansoprazole  (DEXILANT ) 60 MG capsule Take 1 capsule (60 mg total) by mouth daily. 30 capsule 11   EPINEPHrine  0.3 mg/0.3 mL IJ SOAJ injection Inject 0.3 mg into the muscle as needed for anaphylaxis. 1 each 1   fluticasone  (FLONASE ) 50 MCG/ACT nasal spray Place 2 sprays into both nostrils daily as needed for allergies.     hydrochlorothiazide  (HYDRODIURIL ) 25 MG tablet Take 1 tablet (25 mg total) by mouth daily. 90 tablet 3   hydroquinone 4 %  cream Apply 1 application topically 2 (two) times daily as needed (dark spots).     ipratropium (ATROVENT ) 0.03 % nasal spray INSTILL 2 SPRAYS IN EACH NOSTRIL EVERY 12 HOURS (Patient taking differently: Place 2 sprays into both nostrils 2 (two) times daily as needed for rhinitis.) 30 mL 12   loratadine  (CLARITIN ) 10 MG tablet Take 10 mg by mouth daily as needed for allergies.     lubiprostone  (AMITIZA ) 24 MCG capsule Take 1 capsule (24 mcg total) by mouth 2 (two) times daily with a meal. 60 capsule 3   MAGNESIUM  PO Take 330 mg by mouth daily.     ondansetron  (ZOFRAN ) 4 MG tablet Take 1 tablet (4 mg total) by mouth every 8 (eight) hours as needed for nausea or vomiting. 30 tablet 2   pantoprazole  (PROTONIX ) 40 MG tablet Take 1 tablet (40 mg total) by mouth 2 (two) times daily before a meal. 180 tablet 3   rosuvastatin  (CRESTOR ) 40 MG tablet Take 1 tablet (40 mg total) by mouth daily. 90 tablet 3   sodium chloride  (OCEAN) 0.65 % SOLN nasal spray Place 1 spray into both nostrils as needed for congestion.     triamcinolone  cream (KENALOG ) 0.1 % Apply 1 Application topically 2 (two) times daily as needed (irritation). 30 g 1   valsartan  (DIOVAN ) 320 MG tablet Take 1  tablet (320 mg total) by mouth daily. 90 tablet 3   vitamin E 400 UNIT capsule Take 400 Units by mouth 3 (three) times a week.     zinc gluconate 50 MG tablet Take 50 mg by mouth 3 (three) times a week.     Current Facility-Administered Medications  Medication Dose Route Frequency Provider Last Rate Last Admin   nitroGLYCERIN  (NITROSTAT ) SL tablet 0.4 mg  0.4 mg Sublingual Once         Family History  Problem Relation Age of Onset   Diabetes Other    Diabetes Mother    Heart disease Mother    Heart disease Father    Diabetes Sister    Diabetes Brother    Hypertension Brother    Diabetes Daughter    Hypertension Maternal Grandmother    Stroke Maternal Grandfather    Early death Paternal Grandfather    Colon cancer Neg Hx    Liver disease Neg Hx     Social History   Socioeconomic History   Marital status: Single    Spouse name: Not on file   Number of children: 3   Years of education: Not on file   Highest education level: 10th grade  Occupational History   Occupation: Public affairs consultant: [employer]  Tobacco Use   Smoking status: Former    Current packs/day: 0.00    Average packs/day: 0.3 packs/day for 35.0 years (8.8 ttl pk-yrs)    Types: Cigarettes    Start date: 12/31/1977    Quit date: 12/31/2012    Years since quitting: 11.0   Smokeless tobacco: Never  Vaping Use   Vaping status: Never Used  Substance and Sexual Activity   Alcohol use: Not Currently   Drug use: No   Sexual activity: Not Currently    Partners: Male    Birth control/protection: Post-menopausal, Surgical    Comment: tubal  Other Topics Concern   Not on file  Social History Narrative   Eats all food groups.    Wears seatbelt.    Has 3 children.   Prior smoker.   Attends church.  Used to work in Designer, fashion/clothing.    Drives.   Enjoys going out with family.    Right handed   One story home   Social Drivers of Health   Financial Resource Strain: Low Risk  (11/25/2023)   Overall  Financial Resource Strain (CARDIA)    Difficulty of Paying Living Expenses: Not hard at all  Food Insecurity: No Food Insecurity (11/25/2023)   Hunger Vital Sign    Worried About Running Out of Food in the Last Year: Never true    Ran Out of Food in the Last Year: Never true  Transportation Needs: No Transportation Needs (11/25/2023)   PRAPARE - Administrator, Civil Service (Medical): No    Lack of Transportation (Non-Medical): No  Physical Activity: Insufficiently Active (11/25/2023)   Exercise Vital Sign    Days of Exercise per Week: 4 days    Minutes of Exercise per Session: 10 min  Stress: Stress Concern Present (11/25/2023)   [employer] of Occupational Health - Occupational Stress Questionnaire    Feeling of Stress: Rather much  Social Connections: Moderately Integrated (11/25/2023)   Social Connection and Isolation Panel    Frequency of Communication with Friends and Family: More than three times a week    Frequency of Social Gatherings with Friends and Family: Once a week    Attends Religious Services: More than 4 times per year    Active Member of [employer] or Organizations: Yes    Attends Banker Meetings: More than 4 times per year    Marital Status: Widowed  Intimate Partner Violence: Not At Risk (08/02/2023)   Humiliation, Afraid, Rape, and Kick questionnaire    Fear of Current or Ex-Partner: No    Emotionally Abused: No    Physically Abused: No    Sexually Abused: No     REVIEW OF SYSTEMS:   [X]  denotes positive finding, [ ]  denotes negative finding Cardiac  Comments:  Chest pain or chest pressure:    Shortness of breath upon exertion:    Short of breath when lying flat:    Irregular heart rhythm:        Vascular    Pain in calf, thigh, or hip brought on by ambulation:    Pain in feet at night that wakes you up from your sleep:     Blood clot in your veins:    Leg swelling:     Tingling right great toe x   Pulmonary    Oxygen at  home:    Productive cough:     Wheezing:         Neurologic    Sudden weakness in arms or legs:     Sudden numbness in arms or legs:     Sudden onset of difficulty speaking or slurred speech:    Temporary loss of vision in one eye:     Problems with dizziness:     Hx stroke x   Gastrointestinal    Blood in stool:     Vomited blood:         Genitourinary    Burning when urinating:     Blood in urine:        Psychiatric    Major depression:         Hematologic    Bleeding problems: x   Problems with blood clotting too easily:        Skin    Rashes or ulcers:  Constitutional    Fever or chills:      PHYSICAL EXAMINATION:  Today's Vitals   01/04/24 0933  BP: 130/61  Pulse: 64  Temp: 97.7 F (36.5 C)  TempSrc: Temporal  Weight: 155 lb 1.6 oz (70.4 kg)  PainSc: 0-No pain   Body mass index is 25.03 kg/m.   General:  WDWN in NAD; vital signs documented above Gait: Not observed HENT: WNL, normocephalic Pulmonary: normal non-labored breathing , without wheezing Cardiac: regular HR, with carotid bruits bilaterally Abdomen: soft, NT; aortic pulse is not palpable Skin: without rashes Vascular Exam/Pulses:  Right Left  Radial 2+ (normal) 2+ (normal)  Femoral 2+ (normal) 2+ (normal)  DP 2+ (normal) 2+ (normal)   Extremities: without ischemic changes, without Gangrene , without cellulitis; without open wounds Musculoskeletal: no muscle wasting or atrophy  Neurologic: A&O X 3 Psychiatric:  The pt has Normal affect.   Non-Invasive Vascular Imaging:   ABI's/TBI's on 01/04/2024: Right:  0.66/0.49 - Great toe pressure: 78 Left:  0.84/0.87 - Great toe pressure: 137   Previous ABI's/TBI's on 08/22/2023: Right:  0.73/0.38 - Great toe pressure: 42 Left:  0.97/0.77 - Great toe pressure:  85  Previous arterial duplex on 08/22/2023: Summary:  Stenosis: +--------------------+-------------+-----------+  Location            Stenosis     Stent         +--------------------+-------------+-----------+  Right Common Iliac               no stenosis  +--------------------+-------------+-----------+  Left Common Iliac                no stenosis  +--------------------+-------------+-----------+  Right External Iliac<50% stenosis             +--------------------+-------------+-----------+  Left External Iliac              no stenosis  +--------------------+-------------+-----------+     ASSESSMENT/PLAN:: 68 y.o. female here for follow up for PAD with hx of carotid artery stenosis and PAD.  She was thought to have a small AAA, however, CT scan revealed that she did not have AAA.  She also has hx of varicose veins.   On 07/11/2023, she underwent angiogram with stents of bilateral CIA and left EIA by Dr. 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 .   On 07/26/2023, she had an IVC filter placed for DVT with contraindication of anticoagulation also by Dr. Sheree.  PAD -pt with palpable bilateral DP pulses and she does not have any claudication, rest pain or wounds.  She continues to have numbness, tingling of right great toe.  We discussed that this could improve with time but also may not.   -continue statin/plavix   Hx DVT with IVC filter -Dr. Sheree discussed with pt and daughter that filter recommended to be removed but does not have to be done urgently.  Pt will f/u in 6 months with other studies for PAD and Dr. Sheree will d/w she and her daughter further about IVC filter removal.  They are in agreement with this plan.   -pt will f/u in 6 months with carotid duplex, aortoilac duplex (bilateral) and ABI and have further discussions about IVC filter removal.   Lucie Apt, [facility] [phone]  Clinic MD:   Sheree

## 2024-01-05 ENCOUNTER — Other Ambulatory Visit: Payer: Self-pay | Admitting: *Deleted

## 2024-01-05 DIAGNOSIS — I6523 Occlusion and stenosis of bilateral carotid arteries: Secondary | ICD-10-CM

## 2024-01-05 DIAGNOSIS — I739 Peripheral vascular disease, unspecified: Secondary | ICD-10-CM

## 2024-01-05 DIAGNOSIS — I70221 Atherosclerosis of native arteries of extremities with rest pain, right leg: Secondary | ICD-10-CM

## 2024-01-19 ENCOUNTER — Ambulatory Visit: Admitting: Gastroenterology

## 2024-01-19 ENCOUNTER — Ambulatory Visit: Admitting: Physician Assistant

## 2024-01-19 ENCOUNTER — Encounter: Payer: Self-pay | Admitting: Gastroenterology

## 2024-01-19 VITALS — BP 145/70 | HR 75 | Temp 98.1°F | Ht 66.0 in | Wt 155.2 lb

## 2024-01-19 DIAGNOSIS — K5900 Constipation, unspecified: Secondary | ICD-10-CM

## 2024-01-19 DIAGNOSIS — Z860101 Personal history of adenomatous and serrated colon polyps: Secondary | ICD-10-CM

## 2024-01-19 DIAGNOSIS — R1310 Dysphagia, unspecified: Secondary | ICD-10-CM | POA: Diagnosis not present

## 2024-01-19 DIAGNOSIS — K219 Gastro-esophageal reflux disease without esophagitis: Secondary | ICD-10-CM

## 2024-01-19 DIAGNOSIS — R1319 Other dysphagia: Secondary | ICD-10-CM

## 2024-01-19 DIAGNOSIS — Z860100 Personal history of colon polyps, unspecified: Secondary | ICD-10-CM

## 2024-01-19 MED ORDER — LINACLOTIDE 72 MCG PO CAPS: 72.0000 ug | ORAL_CAPSULE | Freq: Every day | ORAL | 3 refills | Status: AC

## 2024-01-19 MED ORDER — PANTOPRAZOLE SODIUM 40 MG PO TBEC: 40.0000 mg | DELAYED_RELEASE_TABLET | Freq: Two times a day (BID) | ORAL | 3 refills | Status: AC

## 2024-01-19 NOTE — Progress Notes (Addendum)
 Gastroenterology Office Note     Primary Care Physician:  Duanne Butler DASEN, MD  Primary Gastroenterologist: Dr. Shaaron    Chief Complaint   Chief Complaint  Patient presents with   Follow-up    Unable to take amitiza , states that it makes her bp drop low and makes her feel like she is going to pass out     History of Present Illness   [name] is a 68 y.o. female presenting today with a history of GERD, constipation, dysphagia, needing EGD/dilation but was on  hold previously due to vascular  stenting of right common iliac artery and left common and external iliac arteries, stroke after procedure march 2025, developed DVT and subsequent occipital hemorrhage, with IVC filter then placed. She is on Plavix  currently. Returns today to discuss EGD/dilation and colonoscopy as we were wanting to wait at least 6 months post-stroke prior to any elective procedure.  At last visit, Amitiza  was prescribed. Called and left a message regarding Amitiza  as was hard to push out stool. Felt like her BP was lower.More active lately. BM about every other day, not significant straining. [employer]. Still feels this could be better.   Feels a knot in epigastric area when rubbing. Painful when pressing but not without pressing. Pantoprazole  BID. Rare nausea  Still with solid food dysphagia. No issues with pills.   Constipation: trulance  in the past, Linzess , trialed on Amitiza . Would like to resume Linzess .   GERD: has taken Dexilant , pantoprazole  BID. Typically taking daily but likes it written as BID just in case.   Colonoscopy 04/2021: -diverticulosis -four 4-68mm polyps sigmoid colon and asc colon removed -ascending colon polyps tubular adenoma -next colonoscopy in 3 years   EGD 11/2017: -mild Schatzki ring s/p dilation -erosive gastropathy   Past Medical History:  Diagnosis Date   Anxiety    Carotid stenosis, asymptomatic, bilateral    Cataract    Chronic left shoulder pain     Headache    Hiatal hernia    small   Hyperlipidemia    Hypertension    Osteoporosis    Pre-diabetes    Schatzki's ring    Seasonal allergies    Stroke [facility])    Tremor     Past Surgical History:  Procedure Laterality Date   ABDOMINAL AORTOGRAM W/LOWER EXTREMITY N/A 07/11/2023   Procedure: ABDOMINAL AORTOGRAM W/LOWER EXTREMITY;  Surgeon: Sheree Penne Bruckner, MD;  Location: [facility] INVASIVE CV LAB;  Service: Cardiovascular;  Laterality: N/A;   CARDIAC CATHETERIZATION  2005   Ms. Kimmer has essentially normal coronary arteries and normal left ventricular function.  She will be treated empirically with anti-reflux measures   CATARACT EXTRACTION, BILATERAL     CHOLECYSTECTOMY     COLONOSCOPY    06/16/2004   MFM:Wnmfjo rectum/Diminutive polyps of the splenic flexure and the cecum/The remainder of the colonic mucosa appeared normal pathology (hyperplastic polyps).   COLONOSCOPY N/A 08/07/2015   Dr. Shaaron: 6 mm descending colon tubular adenoma, multiple medium-mouthed diverticula in sigmoid colon. surveillance 2022   COLONOSCOPY N/A 04/15/2021   Procedure: COLONOSCOPY;  Surgeon: Shaaron Lamar HERO, MD;  Location: [facility] END[facility];  Service: Endoscopy;  Laterality: N/A;  9:30am   ESOPHAGOGASTRODUODENOSCOPY  09/11/2001   MFM:Dryjusxpd ring otherwise normal esophagus/Normal stomach/Normal D1 and D2 status post passage of a 56 French Maloney dilator   ESOPHAGOGASTRODUODENOSCOPY N/A 11/11/2017   Procedure: ESOPHAGOGASTRODUODENOSCOPY (EGD);  Surgeon: Shaaron Lamar HERO, MD; mild Schatzki ring s/p dilation and mild erosive gastropathy.  ESOPHAGOGASTRODUODENOSCOPY (EGD) WITH ESOPHAGEAL DILATION N/A 01/25/2013   Dr. Shaaron- schatzki's ring- 54 F dilation. small hiatal hernia   HYSTEROSCOPY WITH D & C N/A 03/06/2019   Procedure: DILATATION AND CURETTAGE /HYSTEROSCOPY;  Surgeon: Edsel Norleen GAILS, MD;  Location: [facility] ORS;  Service: Gynecology;  Laterality: N/A;   MALONEY DILATION N/A 11/11/2017   Procedure:  AGAPITO DILATION;  Surgeon: Shaaron Lamar HERO, MD;  Location: [facility];  Service: Endoscopy;  Laterality: N/A;   PERIPHERAL VASCULAR BALLOON ANGIOPLASTY Left 07/11/2023   Procedure: PERIPHERAL VASCULAR BALLOON ANGIOPLASTY;  Surgeon: Sheree Penne Bruckner, MD;  Location: [facility] INVASIVE CV LAB;  Service: Cardiovascular;  Laterality: Left;  External Iliac   PERIPHERAL VASCULAR INTERVENTION Bilateral 07/11/2023   Procedure: PERIPHERAL VASCULAR INTERVENTION;  Surgeon: Sheree Penne Bruckner, MD;  Location: [facility] INVASIVE CV LAB;  Service: Cardiovascular;  Laterality: Bilateral;  Bilateral Common Iliac; L External Iliac   POLYPECTOMY N/A 03/06/2019   Procedure: POLYPECTOMY ( REMOVAL OF ENDOMETRIAL POLYP);  Surgeon: Edsel Norleen GAILS, MD;  Location: [facility] ORS;  Service: Gynecology;  Laterality: N/A;   POLYPECTOMY  04/15/2021   Procedure: POLYPECTOMY;  Surgeon: Shaaron Lamar HERO, MD;  Location: [facility];  Service: Endoscopy;;   TUBAL LIGATION     VENA CAVA UMBRELLA NECK APPROACH N/A 07/26/2023   Procedure: INSERTION, UMBRELLA FILTER, INFERIOR VENA CAVA FEMORAL ARTERY;  Surgeon: Sheree Penne Bruckner, MD;  Location: [facility] OR;  Service: Vascular;  Laterality: N/A;    Current Outpatient Medications  Medication Sig Dispense Refill   acetaminophen  (TYLENOL ) 500 MG tablet Take 500 mg by mouth every 6 (six) hours as needed for moderate pain or headache.     ALPRAZolam  (XANAX ) 0.5 MG tablet Take 1 tablet (0.5 mg total) by mouth 2 (two) times daily as needed for anxiety. 30 tablet 0   amLODipine  (NORVASC ) 10 MG tablet TAKE 1 TABLET(10 MG) BY MOUTH AT BEDTIME 90 tablet 1   carvedilol  (COREG ) 3.125 MG tablet Take 2 tablets (6.25 mg total) by mouth 2 (two) times daily with a meal. 360 tablet 3   Cholecalciferol (VITAMIN D) 50 MCG (2000 UT) CAPS Take 2,000 Units by mouth daily.     cloNIDine  (CATAPRES ) 0.1 MG tablet Take 1 tablet (0.1 mg total) by mouth 3 (three) times daily as needed (180/100). 90 tablet 3    clopidogrel  (PLAVIX ) 75 MG tablet Take 1 tablet (75 mg total) by mouth daily. 90 tablet 0   Coenzyme Q10 (COQ10) 100 MG CAPS Take 100 mg by mouth daily.     CRANBERRY PO Take 1 each by mouth 2 (two) times a week. Chewables     Cyanocobalamin  (B-12) 2500 MCG TABS Take 2,500 mcg by mouth daily.     EPINEPHrine  0.3 mg/0.3 mL IJ SOAJ injection Inject 0.3 mg into the muscle as needed for anaphylaxis. 1 each 1   fluticasone  (FLONASE ) 50 MCG/ACT nasal spray Place 2 sprays into both nostrils daily as needed for allergies.     hydrochlorothiazide  (HYDRODIURIL ) 25 MG tablet Take 1 tablet (25 mg total) by mouth daily. 90 tablet 3   hydroquinone 4 % cream Apply 1 application topically 2 (two) times daily as needed (dark spots).     ipratropium (ATROVENT ) 0.03 % nasal spray INSTILL 2 SPRAYS IN EACH NOSTRIL EVERY 12 HOURS (Patient taking differently: Place 2 sprays into both nostrils 2 (two) times daily as needed for rhinitis.) 30 mL 12   ipratropium (ATROVENT ) 0.06 % nasal spray Place 1 spray into both nostrils 3 (three)  times daily.     loratadine  (CLARITIN ) 10 MG tablet Take 10 mg by mouth daily as needed for allergies.     MAGNESIUM  PO Take 330 mg by mouth daily.     ondansetron  (ZOFRAN ) 4 MG tablet Take 1 tablet (4 mg total) by mouth every 8 (eight) hours as needed for nausea or vomiting. 30 tablet 2   pantoprazole  (PROTONIX ) 40 MG tablet Take 1 tablet (40 mg total) by mouth 2 (two) times daily before a meal. 180 tablet 3   rosuvastatin  (CRESTOR ) 20 MG tablet Take 20 mg by mouth at bedtime.     sodium chloride  (OCEAN) 0.65 % SOLN nasal spray Place 1 spray into both nostrils as needed for congestion.     triamcinolone  cream (KENALOG ) 0.1 % Apply 1 Application topically 2 (two) times daily as needed (irritation). 30 g 1   valsartan  (DIOVAN ) 320 MG tablet Take 1 tablet (320 mg total) by mouth daily. 90 tablet 3   vitamin E 400 UNIT capsule Take 400 Units by mouth 3 (three) times a week.     zinc gluconate 50  MG tablet Take 50 mg by mouth 3 (three) times a week.     lubiprostone  (AMITIZA ) 24 MCG capsule Take 1 capsule (24 mcg total) by mouth 2 (two) times daily with a meal. (Patient not taking: Reported on 01/19/2024) 60 capsule 3   Current Facility-Administered Medications  Medication Dose Route Frequency Provider Last Rate Last Admin   nitroGLYCERIN  (NITROSTAT ) SL tablet 0.4 mg  0.4 mg Sublingual Once         Allergies as of 01/19/2024 - Review Complete 01/19/2024  Allergen Reaction Noted   Ciprofloxacin Shortness Of Breath 12/27/2011   Latex Other (See Comments) 10/17/2019   Vitamin c [bioflavonoid products] Itching 04/09/2021    Family History  Problem Relation Age of Onset   Diabetes Other    Diabetes Mother    Heart disease Mother    Heart disease Father    Diabetes Sister    Diabetes Brother    Hypertension Brother    Diabetes Daughter    Hypertension Maternal Grandmother    Stroke Maternal Grandfather    Early death Paternal Grandfather    Colon cancer Neg Hx    Liver disease Neg Hx     Social History   Socioeconomic History   Marital status: Single    Spouse name: Not on file   Number of children: 3   Years of education: Not on file   Highest education level: 10th grade  Occupational History   Occupation: Public affairs consultant: [employer]  Tobacco Use   Smoking status: Former    Current packs/day: 0.00    Average packs/day: 0.3 packs/day for 35.0 years (8.8 ttl pk-yrs)    Types: Cigarettes    Start date: 12/31/1977    Quit date: 12/31/2012    Years since quitting: 11.0   Smokeless tobacco: Never  Vaping Use   Vaping status: Never Used  Substance and Sexual Activity   Alcohol use: Not Currently   Drug use: No   Sexual activity: Not Currently    Partners: Male    Birth control/protection: Post-menopausal, Surgical    Comment: tubal  Other Topics Concern   Not on file  Social History Narrative   Eats all food groups.    Wears seatbelt.    Has  3 children.   Prior smoker.   Attends church.    Used to work in Designer, fashion/clothing.  Drives.   Enjoys going out with family.    Right handed   One story home   Social Drivers of Health   Financial Resource Strain: Low Risk  (11/25/2023)   Overall Financial Resource Strain (CARDIA)    Difficulty of Paying Living Expenses: Not hard at all  Food Insecurity: No Food Insecurity (11/25/2023)   Hunger Vital Sign    Worried About Running Out of Food in the Last Year: Never true    Ran Out of Food in the Last Year: Never true  Transportation Needs: No Transportation Needs (11/25/2023)   PRAPARE - Administrator, Civil Service (Medical): No    Lack of Transportation (Non-Medical): No  Physical Activity: Insufficiently Active (11/25/2023)   Exercise Vital Sign    Days of Exercise per Week: 4 days    Minutes of Exercise per Session: 10 min  Stress: Stress Concern Present (11/25/2023)   [employer] of Occupational Health - Occupational Stress Questionnaire    Feeling of Stress: Rather much  Social Connections: Moderately Integrated (11/25/2023)   Social Connection and Isolation Panel    Frequency of Communication with Friends and Family: More than three times a week    Frequency of Social Gatherings with Friends and Family: Once a week    Attends Religious Services: More than 4 times per year    Active Member of [employer] or Organizations: Yes    Attends Banker Meetings: More than 4 times per year    Marital Status: Widowed  Intimate Partner Violence: Not At Risk (08/02/2023)   Humiliation, Afraid, Rape, and Kick questionnaire    Fear of Current or Ex-Partner: No    Emotionally Abused: No    Physically Abused: No    Sexually Abused: No     Review of Systems   Gen: Denies any fever, chills, fatigue, weight loss, lack of appetite.  CV: Denies chest pain, heart palpitations, peripheral edema, syncope.  Resp: Denies shortness of breath at rest or with exertion. Denies  wheezing or cough.  GI: see HPI GU : Denies urinary burning, urinary frequency, urinary hesitancy MS: Denies joint pain, muscle weakness, cramps, or limitation of movement.  Derm: Denies rash, itching, dry skin Psych: Denies depression, anxiety, memory loss, and confusion Heme: Denies bruising, bleeding, and enlarged lymph nodes.   Physical Exam   BP (!) 145/70 (BP Location: Right Arm, Patient Position: Sitting, Cuff Size: Normal)   Pulse 75   Temp 98.1 F (36.7 C) (Oral)   Ht 5' 6 (1.676 m)   Wt 155 lb 3.2 oz (70.4 kg)   SpO2 97%   BMI 25.05 kg/m  General:   Alert and oriented. Pleasant and cooperative. Well-nourished and well-developed.  Head:  Normocephalic and atraumatic. Eyes:  Without icterus Abdomen:  +BS, soft, non-tender and non-distended. No HSM noted. No guarding or rebound. No masses appreciated.  Rectal:  Deferred  Msk:  Symmetrical without gross deformities. Normal posture. Extremities:  Without edema. Neurologic:  Alert and  oriented x4;  grossly normal neurologically. Skin:  Intact without significant lesions or rashes. Psych:  Alert and cooperative. Normal mood and affect.   Assessment   [name] is a 68 y.o. female presenting today with a history of GERD, constipation, dysphagia, needing EGD/dilation but was on  hold previously due to vascular  stenting of right common iliac artery and left common and external iliac arteries, stroke after procedure March 2025, developed DVT and subsequent occipital hemorrhage, with IVC filter then  placed. She is on Plavix  currently. Returns today to discuss EGD/dilation and colonoscopy as we were wanting to wait at least 6 months post-stroke prior to any elective procedure.  Dysphagia: continues with solid food dysphagia, prior improvement with dilation in 2019 of Schaztki's ring. Continue pantoprazole  daily to BID (keeps second dose on hand in case of refractory symptoms, which is rare).  Constipation: previously  taking trulance , linzess , amitiza . Feels linzess  has worked better historically. Resume Linzess  72 mcg daily   GERD: continue pantoprazole  at lowest dose that is effective.   Hx of polyps in 2022: tubular adenomas (4) with 3 year surveillance due now.   We discussed with Neurology this case, and it is recommended she hold Plavix  5 days prior to procedure. Cleared to pursue. Discussed with Dr. Shaaron as well.    PLAN    Proceed with colonoscopy/EGD/dilation by Dr. Shaaron in near future: the risks, benefits, and alternatives have been discussed with the patient in detail. The patient states understanding and desires to proceed. Linzess  72 mcg daily Continue PPI daily Neurology clearance prior to procedure: obtained. HOLD PLAVIX  5 days prior.    Therisa MICAEL Stager, PhD, ANP-BC [facility] Gastroenterology

## 2024-01-19 NOTE — Patient Instructions (Signed)
 Let's try Linzess  72 mcg daily. Linzess  works best when taken once a day every day, on an empty stomach, at least 30 minutes before your first meal of the day.  When Linzess  is taken daily as directed:  *Constipation relief is typically felt in about a week *IBS-C patients may begin to experience relief from belly pain and overall abdominal symptoms (pain, discomfort, and bloating) in about 1 week,   with symptoms typically improving over 12 weeks.  Diarrhea may occur in the first 2 weeks -keep taking it.  The diarrhea should go away and you should start having normal, complete, full bowel movements. It may be helpful to start treatment when you can be near the comfort of your own bathroom, such as a weekend.    We are arranging a colonoscopy, upper endoscopy, and dilation with Dr. Shaaron in October!  Continue pantoprazole  daily and take at lowest dose that is effective for you.  Happy early birthday!  I enjoyed seeing you again today! I value our relationship and want to provide genuine, compassionate, and quality care. You may receive a survey regarding your visit with me, and I welcome your feedback! Thanks so much for taking the time to complete this. I look forward to seeing you again.      Therisa MICAEL Stager, PhD, ANP-BC [facility] Gastroenterology

## 2024-01-20 ENCOUNTER — Ambulatory Visit: Payer: Medicare Other | Admitting: Physician Assistant

## 2024-01-23 ENCOUNTER — Other Ambulatory Visit: Payer: Self-pay | Admitting: *Deleted

## 2024-01-23 ENCOUNTER — Encounter: Payer: Self-pay | Admitting: *Deleted

## 2024-01-23 MED ORDER — PEG 3350-KCL-NA BICARB-NACL 420 G PO SOLR
4000.0000 mL | Freq: Once | ORAL | 0 refills | Status: AC
Start: 2024-01-23 — End: 2024-01-23

## 2024-01-28 ENCOUNTER — Other Ambulatory Visit: Payer: Self-pay | Admitting: Family Medicine

## 2024-01-30 NOTE — Telephone Encounter (Signed)
 Requested medications are due for refill today.  yes  Requested medications are on the active medications list.  yes  Last refill. 11/30/2023 #30 0 rf  Future visit scheduled.   A wellness visit is scheduled for 2026  Notes to clinic.  Refill not delegated.    Requested Prescriptions  Pending Prescriptions Disp Refills   ALPRAZolam  (XANAX ) 0.5 MG tablet [Pharmacy Med Name: ALPRAZOLAM  0.5 MG TABLET] 30 tablet 0    Sig: TAKE 1 TABLET BY MOUTH 2 TIMES DAILY AS NEEDED FOR ANXIETY.     Not Delegated - Psychiatry: Anxiolytics/Hypnotics 2 Failed - 01/30/2024  2:51 PM      Failed - This refill cannot be delegated      Passed - Urine Drug Screen completed in last 360 days      Passed - Patient is not pregnant      Passed - Valid encounter within last 6 months    Recent Outpatient Visits           2 months ago Benign essential HTN   [place] [facility] Duanne Butler DASEN, MD   4 months ago Benign essential HTN   [place] [facility] Duanne Butler DASEN, MD   5 months ago Benign essential HTN   [place] [facility] Duanne Butler DASEN, MD   6 months ago Benign essential HTN   [place] [facility] Duanne Butler DASEN, MD   6 months ago Benign essential HTN   [place] [facility] Pickard, Butler DASEN, MD

## 2024-02-06 ENCOUNTER — Other Ambulatory Visit: Payer: Self-pay | Admitting: Family Medicine

## 2024-02-06 NOTE — Telephone Encounter (Signed)
 Copied from CRM #[id number]. Topic: Clinical - Prescription Issue >> Feb 06, 2024 12:11 PM Edsel HERO wrote: Patient states [facility] never received the rx for amLODipine  (NORVASC ) 10 MG tablet that was sent on 10/10/23. Can rx be resent to  [id number] - [place], [place] - [address]  Phone: [phone] Fax: [phone]

## 2024-02-07 ENCOUNTER — Telehealth: Payer: Self-pay | Admitting: Gastroenterology

## 2024-02-07 NOTE — Telephone Encounter (Signed)
 Mindy/Tammy: can we please get neurology clearance for her to complete colonoscopy/EGD/dilation? Hx of stroke in May 2025. We will have her stay on Plavix .

## 2024-02-08 ENCOUNTER — Telehealth: Payer: Self-pay | Admitting: *Deleted

## 2024-02-08 NOTE — Telephone Encounter (Signed)
 Clearance sent

## 2024-02-08 NOTE — Telephone Encounter (Signed)
 Too soon for refill, LRF 10/10/23 for 90 and 1 RF.  Requested Prescriptions  Pending Prescriptions Disp Refills   amLODipine  (NORVASC ) 10 MG tablet 90 tablet 1     Cardiovascular: Calcium  Channel Blockers 2 Failed - 02/08/2024 11:29 AM      Failed - Last BP in normal range    BP Readings from Last 1 Encounters:  01/19/24 (!) 145/70         Passed - Last Heart Rate in normal range    Pulse Readings from Last 1 Encounters:  01/19/24 75         Passed - Valid encounter within last 6 months    Recent Outpatient Visits           2 months ago Benign essential HTN   [place] [facility] Duanne Butler DASEN, MD   4 months ago Benign essential HTN   [place] [facility] Duanne Butler DASEN, MD   6 months ago Benign essential HTN   [place] [facility] Duanne Butler DASEN, MD   6 months ago Benign essential HTN   [place] [facility] Duanne Butler DASEN, MD   6 months ago Benign essential HTN    [place] [facility] Pickard, Butler DASEN, MD

## 2024-02-08 NOTE — Telephone Encounter (Signed)
  Request for patient to stop medication prior to procedure or is needing cleareance  02/08/24  MCKYNLEE LUSE [birth year]  What type of surgery is being performed? Colonoscopy/ Esophagogastroduodenoscopy (EGD)  with esophageal dialtion  When is surgery scheduled? 03/02/24  What type of clearance is required (medical or pharmacy to hold medication or both? MEDICAL  Patient is needing to have clearance from neurology  prior to having procedure due to having stroke in 5/25. Patient will remain on Plavix .   Name of physician performing surgery?  Dr.[name] [facility] at [employer]: [phone], option 5 Fax: [phone]  Anesthesia type (none, local, MAC, general)? MAC   ? Yes ? No Patient can hold medication as requested   Signature: ___________________________

## 2024-02-08 NOTE — Telephone Encounter (Signed)
 Clearance faxed per providers request

## 2024-02-09 NOTE — Telephone Encounter (Signed)
 Please see clearance scanned under media tab.

## 2024-02-16 NOTE — Telephone Encounter (Signed)
 I had previously said to stay on Plavix , so please make sure she knows she needs to HOLD Plavix  5 days prior. Neuro is aware and recommended this. I have also discussed with Dr. Shaaron this case.

## 2024-02-17 NOTE — Telephone Encounter (Signed)
 Attempted to call pt, says call can not be completed at this time, please try call again later.

## 2024-02-20 ENCOUNTER — Encounter: Payer: Self-pay | Admitting: *Deleted

## 2024-02-20 NOTE — Telephone Encounter (Signed)
Called pt unable to leave vm due to mailbox being full

## 2024-02-21 ENCOUNTER — Encounter: Payer: Self-pay | Admitting: *Deleted

## 2024-02-21 NOTE — Telephone Encounter (Signed)
 Pt has been informed to start holding Plavix  x 5 days prior to procedure per Neuro recommendations. She was informed to start holding Plavix  on 02/26/24. Pt verbalized understanding.

## 2024-02-28 ENCOUNTER — Encounter (HOSPITAL_COMMUNITY)
Admission: RE | Admit: 2024-02-28 | Discharge: 2024-02-28 | Disposition: A | Discharge status: Home / Self Care | Source: Ambulatory Visit | Attending: Internal Medicine | Admitting: Internal Medicine

## 2024-02-28 ENCOUNTER — Encounter (HOSPITAL_COMMUNITY): Payer: Self-pay

## 2024-02-28 ENCOUNTER — Other Ambulatory Visit: Payer: Self-pay

## 2024-02-28 HISTORY — DX: Unspecified osteoarthritis, unspecified site: M19.90

## 2024-02-28 HISTORY — DX: Fatty (change of) liver, not elsewhere classified: K76.0

## 2024-02-29 NOTE — Progress Notes (Signed)
 [facility] Neurology Division Clinic Note - Progress note   [name] is a 68 y.o. female with a history of hypertension, hyperlipidemia, anxiety, history of abdominal aortic aneurysm, prediabetes, history of tension headaches, recent PCA infarct presenting today in follow up for evaluation of headaches. She is stable, no worsening of symptoms. No new stroke symptoms.      Update Frequency?  Not that often  once a month   Quality:  8/10 pulsating, mostly on the left side and in both temporal regions Accompanied by bright lights getting better  Phonophobia? Endorsed, with a little high pitch sound Nausea?  Endorsed   Confusion? No   Working?  No   Heavy lifting? Denies not since the stroke  Dizziness?  Occasional light headedness attributed to meds Vision changes?  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  Have a hard time falling asleep, may wake up after 3 hours and then fall back asleep  Caffeine :  No   Water : I drink plenty water  Exercise: I walk  up to 5 days a week Depression:  Denies    Current NSAIDS: No  Current analgesics:  Tylenol      Current triptans: no  Current ergotamine:  none   Current anti-emetic:  no       Current muscle relaxants:  none   Current anti-anxiolytic:  takes Xanax  as needed  Current sleep aide:  none   Current Antihypertensive medications: valsartan , coreg , amlodipine     Current Antidepressant medications: no    Current Anticonvulsant medications:  no  Current anti-CGRP:  none   Current Vitamins/Herbal/Supplements: B12, Mg, Vit D, Zinc    Current Antihistamines/Decongestants:  no  Other therapy: Plavix  Hormone/birth control:  none   Other medications: none    Initial visit 09/2021    Patient is a very pleasant 68 year old woman f initially seen at [facility] neurology for evaluation of essential tremor, currently on metoprolol ,  history of  hypertension, hyperlipidemia, anxiety, and a history of abdominal aortic aneurysm and prediabetes.  She recently had been seen at the ED on 10/02/2021 with complaints of significant headaches, jumbled words , and slight left hand heaviness with the headaches .  his last month, her blood pressure has been significantly uncontrolled reaching up to 200s.  She has some floaters in her right eye, otherwise she denies any visual complaints.  She has chronic bilateral foot tingling, but there is no new areas of numbness.  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  Also seen, 45% narrowing of the right vertebral origin.  Pending on these findings, an MRI of the brain without contrast was performed, negative for stroke or other abnormality is.  It was suspected that these symptoms were due to hypertensive emergency, which had resolved at the time of neurological evaluation.  She was discharged on gabapentin  300 mg twice daily, but the patient reduced it to 300 mg at night, because of increased sleepiness.  Patient is on statin, metoprolol , amlodipine , and clonidine  as needed as well. Denies any history of TIA. Denies vertigo dizziness or vision changes. Denies dysphagia. No confusion Denies any fever or chills, or night sweats. No tobacco. No new meds or hormonal supplements. Does take a regular ASA a day, with no other  antiplatelets or anticoagulants.Denies any recent long distance trips or recent surgeries. No sick contacts. No new stressors present in personal life.  Patient is compliant with his medications.Patient is active. She is here in follow-up.  She is alone during this visit.     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    On 07/11/2023 the patient was admitted for PTCA of  bilateral common femoral arteries and stent to bilateral common iliac arteries and one of the procedures.  At the time, she had been placed on conscious sedation.  While she was interactive with the staff postprocedure, she complained of bilateral visual blurring on the left temporal field with fluctuating blood pressure between 120s and 140s to 90,.  Code stroke was called, neurology evaluated patient.   CTA showed right ICA bulb 60%, and severe stenosis of siphon .  She was admitted to the hospital for further evaluation, neurology saw the patient in consultation.  Symptoms were felt most likely to have been secondary to recovery from sedation with transient hypotension possibly contributing. MRI of the brain was obtained and the findings were consistent with acute right PCA territory infarction affecting the right occipital lobe, posterior medial temporal lobe and portion of the right hippocampus.  There was no evidence of hemorrhage or mass effect.  There was low moderate chronic small vessel disease.   2D echo EF 55 to 60% LDL was 75 A1c was 6.0 VTE prophylaxis: She was placed on 5000 units of heparin  every 8 hours, then followed by aspirin  81 mg daily (she was on baby aspirin  prior to admission), and added Plavix  75 mg daily  Continue aspirin  and Plavix , good control of her blood pressure, continue lipid medicines, and follow with cardiology, cardiovascular surgery     CTA 09/09/2021 personally reviewed, agree with radiology: 1. No emergent finding. 2. At least 70% atheromatous narrowing of the right common carotid bifurcation and right cavernous ICA. 50% stenosis of the paraclinoid right ICA. 3. 45% narrowing at the right vertebral origin. 4. Mild intracranial branch atherosclerosis.     Past Medical History:  Diagnosis Date   Anxiety    Arthritis    Carotid stenosis, asymptomatic, bilateral    Cataract    Chronic left shoulder pain    Fatty liver    Headache    Hiatal hernia     small   Hyperlipidemia    Hypertension    Osteoporosis    Pre-diabetes    Schatzki's ring    Seasonal allergies    Stroke (HCC)    affected vision, but no other problems.March 2025   Tremor    Allergies  Allergen Reactions   Ciprofloxacin Shortness Of Breath   Latex Other (See Comments)    Burns skin   Vitamin C [Bioflavonoid Products] Itching    Past Surgical History:  Procedure Laterality Date   ABDOMINAL AORTOGRAM W/LOWER EXTREMITY N/A 07/11/2023   Procedure: ABDOMINAL AORTOGRAM W/LOWER EXTREMITY;  Surgeon: Sheree Penne Bruckner, MD;  Location: [facility] INVASIVE CV LAB;  Service: Cardiovascular;  Laterality: N/A;   CARDIAC CATHETERIZATION  2005   Ms. Circle has essentially normal coronary arteries and normal left ventricular function.  She will be treated empirically with anti-reflux measures   CATARACT EXTRACTION, BILATERAL     CHOLECYSTECTOMY     COLONOSCOPY    06/16/2004   MFM:Wnmfjo rectum/Diminutive polyps of the splenic flexure and the cecum/The remainder of the colonic mucosa appeared normal pathology (hyperplastic polyps).   COLONOSCOPY N/A 08/07/2015  Dr. Shaaron: 6 mm descending colon tubular adenoma, multiple medium-mouthed diverticula in sigmoid colon. surveillance 2022   COLONOSCOPY N/A 04/15/2021   Procedure: COLONOSCOPY;  Surgeon: Shaaron Lamar HERO, MD;  Location: [facility] ENDO SUITE;  Service: Endoscopy;  Laterality: N/A;  9:30am   ESOPHAGOGASTRODUODENOSCOPY  09/11/2001   MFM:Dryjusxpd ring otherwise normal esophagus/Normal stomach/Normal D1 and D2 status post passage of a 56 French Maloney dilator   ESOPHAGOGASTRODUODENOSCOPY N/A 11/11/2017   Procedure: ESOPHAGOGASTRODUODENOSCOPY (EGD);  Surgeon: Shaaron Lamar HERO, MD; mild Schatzki ring s/p dilation and mild erosive gastropathy.   ESOPHAGOGASTRODUODENOSCOPY (EGD) WITH ESOPHAGEAL DILATION N/A 01/25/2013   Dr. Shaaron- schatzki's ring- 54 F dilation. small hiatal hernia   HYSTEROSCOPY WITH D & C N/A 03/06/2019   Procedure:  DILATATION AND CURETTAGE /HYSTEROSCOPY;  Surgeon: Edsel Norleen GAILS, MD;  Location: [facility] ORS;  Service: Gynecology;  Laterality: N/A;   MALONEY DILATION N/A 11/11/2017   Procedure: AGAPITO DILATION;  Surgeon: Shaaron Lamar HERO, MD;  Location: [facility];  Service: Endoscopy;  Laterality: N/A;   PERIPHERAL VASCULAR BALLOON ANGIOPLASTY Left 07/11/2023   Procedure: PERIPHERAL VASCULAR BALLOON ANGIOPLASTY;  Surgeon: Sheree Penne Bruckner, MD;  Location: [facility] INVASIVE CV LAB;  Service: Cardiovascular;  Laterality: Left;  External Iliac   PERIPHERAL VASCULAR INTERVENTION Bilateral 07/11/2023   Procedure: PERIPHERAL VASCULAR INTERVENTION;  Surgeon: Sheree Penne Bruckner, MD;  Location: [facility] INVASIVE CV LAB;  Service: Cardiovascular;  Laterality: Bilateral;  Bilateral Common Iliac; L External Iliac   POLYPECTOMY N/A 03/06/2019   Procedure: POLYPECTOMY ( REMOVAL OF ENDOMETRIAL POLYP);  Surgeon: Edsel Norleen GAILS, MD;  Location: [facility] ORS;  Service: Gynecology;  Laterality: N/A;   POLYPECTOMY  04/15/2021   Procedure: POLYPECTOMY;  Surgeon: Shaaron Lamar HERO, MD;  Location: [facility];  Service: Endoscopy;;   TUBAL LIGATION     VENA CAVA UMBRELLA NECK APPROACH N/A 07/26/2023   Procedure: INSERTION, UMBRELLA FILTER, INFERIOR VENA CAVA FEMORAL ARTERY;  Surgeon: Sheree Penne Bruckner, MD;  Location: [facility] OR;  Service: Vascular;  Laterality: N/A;   Social History   Social History Narrative   Eats all food groups.    Wears seatbelt.    Has 3 children.   Prior smoker.   Attends church.    Used to work in Designer, fashion/clothing.    Drives.   Enjoys going out with family.    Right handed   One story home    Family History  Problem Relation Age of Onset   Diabetes Other    Diabetes Mother    Heart disease Mother    Heart disease Father    Diabetes Sister    Diabetes Brother    Hypertension Brother    Diabetes Daughter    Hypertension Maternal Grandmother    Stroke Maternal Grandfather    Early death Paternal  Grandfather    Colon cancer Neg Hx    Liver disease Neg Hx      Scheduled Meds:  nitroGLYCERIN   0.4 mg Sublingual Once       CBC    Latest Ref Rng & Units 12/05/2023    7:56 PM 11/25/2023    3:58 PM 08/07/2023    8:30 PM  CBC  WBC 4.0 - 10.5 K/uL 7.0  6.7  6.2   Hemoglobin 12.0 - 15.0 g/dL 88.2  88.6  88.7   Hematocrit 36.0 - 46.0 % 35.2  34.0  34.2   Platelets 150 - 400 K/uL 327  272  322        Latest Ref Rng &  Units 12/05/2023    7:56 PM 11/25/2023    3:58 PM 08/07/2023    8:30 PM  CMP  Glucose 70 - 99 mg/dL 884  88  92   BUN 8 - 23 mg/dL 9  12  6    Creatinine 0.44 - 1.00 mg/dL 9.12  9.06  9.24   Sodium 135 - 145 mmol/L 135  132  137   Potassium 3.5 - 5.1 mmol/L 3.7  4.2  4.1   Chloride 98 - 111 mmol/L 97  95  104   CO2 22 - 32 mmol/L 27  27  23    Calcium  8.9 - 10.3 mg/dL 9.3  9.6  9.4   Total Protein 6.1 - 8.1 g/dL  6.8    Total Bilirubin 0.2 - 1.2 mg/dL  0.7    AST 10 - 35 U/L  15    ALT 6 - 29 U/L  13          General Medical Exam:   General:  Well appearing, comfortable.   Eyes/ENT: see cranial nerve examination.   Neck:   mild R carotid bruit  Respiratory:  Clear to auscultation, good air entry bilaterally.   Cardiac:  Regular rate and rhythm, 1 out of 6 murmur.   Extremities:  No deformities, edema, or skin discoloration.  Skin:  No rashes or lesions.  Neurological Exam:  MENTAL STATUS including orientation to time, place, person, recent and remote memory, attention span and concentration, language, and fund of knowledge is normal.  Speech is not dysarthric.  CRANIAL NERVES: II:  L upper quadrantanopsia .  Fundi not visualized III-IV-VI: Pupils equal round and reactive to light.  Normal conjugate, extra-ocular eye movements in all directions of gaze.  No nystagmus.  No ptosis .   V:  Normal facial sensation.    VII:  Normal facial symmetry and movements.   VIII:  Normal hearing and vestibular function.   IX-X:  Normal palatal movement.   XI:  Normal  shoulder shrug and head rotation.   XII:  Normal tongue strength and range of motion, no deviation or fasciculation.  MOTOR:  No atrophy, fasciculations or abnormal movements.  No pronator drift.    SENSORY:  Normal and symmetric perception of light touch, pinprick, vibration, and proprioception.  Romberg's sign absent.   COORDINATION/GAIT: Normal finger-to- nose-finger and heel-to-shin.  Intact rapid alternating movements bilaterally.  Able to rise from a chair without using arms.  Gait narrow based and stable. Tandem and stressed gait intact.   Assessment/Plan     Limit use of pain relievers to no more than 2 days out of week to prevent risk of rebound or medication-overuse headache.  Keep headache diary  Exercise, hydration, caffeine  cessation, sleep hygiene, monitor for and avoid triggers Continue magnesium  citrate  CoQ, Zinc, B12  Secondary stroke prevention, with goal BP less than 140/90, LDL 70 and A1c less than 6  Continue Plavix , follow with Cards Follow up in  6 months     Total time spent:  21 min   Thank you for allowing me to participate in patient's care.  If I can answer any additional questions, I would be pleased to do so.    Sincerely,   Camie Sevin, PA-C

## 2024-03-01 ENCOUNTER — Encounter: Payer: Self-pay | Admitting: Physician Assistant

## 2024-03-01 ENCOUNTER — Ambulatory Visit: Admitting: Physician Assistant

## 2024-03-01 VITALS — BP 136/76 | HR 75 | Resp 20 | Ht 66.0 in | Wt 159.0 lb

## 2024-03-01 DIAGNOSIS — G44229 Chronic tension-type headache, not intractable: Secondary | ICD-10-CM | POA: Diagnosis not present

## 2024-03-01 DIAGNOSIS — I639 Cerebral infarction, unspecified: Secondary | ICD-10-CM

## 2024-03-01 DIAGNOSIS — H53462 Homonymous bilateral field defects, left side: Secondary | ICD-10-CM | POA: Diagnosis not present

## 2024-03-01 NOTE — Patient Instructions (Signed)
  Follow up in 6 months  Continue the CoQ, B12 and Mag , Zinc, and tylenol   Recommend good stress management and sleep  Stay hydrated  Follow with cardiology  and [facility]   Continue  Plavix    Limit use of pain relievers to no more than 2 days out of the week.  These medications include acetaminophen ,   and narcotics.  This will help reduce risk of rebound headaches. Be aware of common food triggers:  - Caffeine :  coffee, black tea, cola, Mt. Dew  - Chocolate  - Dairy:  aged cheeses (brie, blue, cheddar, gouda, Parmasan, provolone, romano, Swiss, etc), chocolate milk, buttermilk, sour cream, limit eggs and yogurt  - Nuts, peanut butter  - Alcohol  - Cereals/grains:  FRESH breads (fresh bagels, sourdough, doughnuts), yeast productions  - Processed/canned/aged/cured meats (pre-packaged deli meats, hotdogs)  - MSG/glutamate:  soy sauce, flavor enhancer, pickled/preserved/marinated foods  - Sweeteners:  aspartame (Equal, Nutrasweet).  Sugar and Splenda are okay  - Vegetables:  legumes (lima beans, lentils, snow peas, fava beans, pinto peans, peas, garbanzo beans), sauerkraut, onions, olives, pickles  - Fruit:  avocados, bananas, citrus fruit (orange, lemon, grapefruit), mango  - Other:  Frozen meals, macaroni and cheese Routine exercise Stay adequately hydrated (aim for 64 oz water  daily) Keep headache diary Maintain proper stress management Maintain proper sleep hygiene Do not skip meals Consider supplements:  magnesium  citrate 400mg  daily, riboflavin 400mg  daily, coenzyme Q10 100mg  three times daily. 50 25

## 2024-03-02 ENCOUNTER — Other Ambulatory Visit: Payer: Self-pay

## 2024-03-02 ENCOUNTER — Encounter (HOSPITAL_COMMUNITY): Payer: Self-pay | Admitting: Internal Medicine

## 2024-03-02 ENCOUNTER — Encounter (HOSPITAL_COMMUNITY)
Admission: RE | Disposition: A | Discharge status: Home / Self Care | Payer: Self-pay | Source: Home / Self Care | Attending: Internal Medicine

## 2024-03-02 ENCOUNTER — Ambulatory Visit (HOSPITAL_COMMUNITY): Admitting: Certified Registered"

## 2024-03-02 ENCOUNTER — Ambulatory Visit (HOSPITAL_COMMUNITY)
Admission: RE | Admit: 2024-03-02 | Discharge: 2024-03-02 | Disposition: A | Discharge status: Home / Self Care | Attending: Internal Medicine | Admitting: Internal Medicine

## 2024-03-02 DIAGNOSIS — I10 Essential (primary) hypertension: Secondary | ICD-10-CM | POA: Diagnosis not present

## 2024-03-02 DIAGNOSIS — Z79899 Other long term (current) drug therapy: Secondary | ICD-10-CM | POA: Diagnosis not present

## 2024-03-02 DIAGNOSIS — K2289 Other specified disease of esophagus: Secondary | ICD-10-CM | POA: Diagnosis not present

## 2024-03-02 DIAGNOSIS — Z87891 Personal history of nicotine dependence: Secondary | ICD-10-CM | POA: Insufficient documentation

## 2024-03-02 DIAGNOSIS — D122 Benign neoplasm of ascending colon: Secondary | ICD-10-CM | POA: Insufficient documentation

## 2024-03-02 DIAGNOSIS — M810 Age-related osteoporosis without current pathological fracture: Secondary | ICD-10-CM | POA: Insufficient documentation

## 2024-03-02 DIAGNOSIS — Z1211 Encounter for screening for malignant neoplasm of colon: Secondary | ICD-10-CM

## 2024-03-02 DIAGNOSIS — Z860101 Personal history of adenomatous and serrated colon polyps: Secondary | ICD-10-CM | POA: Diagnosis not present

## 2024-03-02 DIAGNOSIS — K222 Esophageal obstruction: Secondary | ICD-10-CM | POA: Insufficient documentation

## 2024-03-02 DIAGNOSIS — K3189 Other diseases of stomach and duodenum: Secondary | ICD-10-CM | POA: Insufficient documentation

## 2024-03-02 DIAGNOSIS — R1310 Dysphagia, unspecified: Secondary | ICD-10-CM | POA: Diagnosis not present

## 2024-03-02 DIAGNOSIS — R519 Headache, unspecified: Secondary | ICD-10-CM | POA: Diagnosis not present

## 2024-03-02 DIAGNOSIS — E1151 Type 2 diabetes mellitus with diabetic peripheral angiopathy without gangrene: Secondary | ICD-10-CM | POA: Insufficient documentation

## 2024-03-02 DIAGNOSIS — R1314 Dysphagia, pharyngoesophageal phase: Secondary | ICD-10-CM | POA: Insufficient documentation

## 2024-03-02 DIAGNOSIS — K5730 Diverticulosis of large intestine without perforation or abscess without bleeding: Secondary | ICD-10-CM | POA: Diagnosis not present

## 2024-03-02 DIAGNOSIS — F419 Anxiety disorder, unspecified: Secondary | ICD-10-CM | POA: Diagnosis not present

## 2024-03-02 DIAGNOSIS — E785 Hyperlipidemia, unspecified: Secondary | ICD-10-CM | POA: Diagnosis not present

## 2024-03-02 DIAGNOSIS — K449 Diaphragmatic hernia without obstruction or gangrene: Secondary | ICD-10-CM | POA: Insufficient documentation

## 2024-03-02 DIAGNOSIS — K219 Gastro-esophageal reflux disease without esophagitis: Secondary | ICD-10-CM | POA: Insufficient documentation

## 2024-03-02 DIAGNOSIS — Z7902 Long term (current) use of antithrombotics/antiplatelets: Secondary | ICD-10-CM | POA: Insufficient documentation

## 2024-03-02 HISTORY — PX: ESOPHAGEAL DILATION: SHX303

## 2024-03-02 HISTORY — PX: COLONOSCOPY: SHX5424

## 2024-03-02 HISTORY — PX: ESOPHAGOGASTRODUODENOSCOPY: SHX5428

## 2024-03-02 LAB — GLUCOSE, CAPILLARY: Glucose-Capillary: 82 mg/dL (ref 70–99)

## 2024-03-02 SURGERY — COLONOSCOPY
Anesthesia: General

## 2024-03-02 MED ORDER — PHENYLEPHRINE 80 MCG/ML (10ML) SYRINGE FOR IV PUSH (FOR BLOOD PRESSURE SUPPORT)
PREFILLED_SYRINGE | INTRAVENOUS | Status: DC | PRN
Start: 2024-03-02 — End: 2024-03-02
  Administered 2024-03-02: 80 ug via INTRAVENOUS
  Administered 2024-03-02: 160 ug via INTRAVENOUS

## 2024-03-02 MED ORDER — LIDOCAINE 2% (20 MG/ML) 5 ML SYRINGE
INTRAMUSCULAR | Status: DC | PRN
Start: 2024-03-02 — End: 2024-03-02
  Administered 2024-03-02: 60 mg via INTRAVENOUS

## 2024-03-02 MED ORDER — LACTATED RINGERS IV SOLN: INTRAVENOUS | Status: DC | PRN

## 2024-03-02 MED ORDER — PROPOFOL 10 MG/ML IV BOLUS
INTRAVENOUS | Status: DC | PRN
Start: 2024-03-02 — End: 2024-03-02
  Administered 2024-03-02: 20 mg via INTRAVENOUS
  Administered 2024-03-02: 125 ug/kg/min via INTRAVENOUS
  Administered 2024-03-02 (×2): 20 mg via INTRAVENOUS
  Administered 2024-03-02: 70 mg via INTRAVENOUS
  Administered 2024-03-02: 30 mg via INTRAVENOUS

## 2024-03-02 NOTE — Discharge Instructions (Addendum)
 EGD Discharge instructions Please read the instructions outlined below and refer to this sheet in the next few weeks. These discharge instructions provide you with general information on caring for yourself after you leave the hospital. Your doctor may also give you specific instructions. While your treatment has been planned according to the most current medical practices available, unavoidable complications occasionally occur. If you have any problems or questions after discharge, please call your doctor. ACTIVITY You may resume your regular activity but move at a slower pace for the next 24 hours.  Take frequent rest periods for the next 24 hours.  Walking will help expel (get rid of) the air and reduce the bloated feeling in your abdomen.  No driving for 24 hours (because of the anesthesia (medicine) used during the test).  You may shower.  Do not sign any important legal documents or operate any machinery for 24 hours (because of the anesthesia used during the test).  NUTRITION Drink plenty of fluids.  You may resume your normal diet.  Begin with a light meal and progress to your normal diet.  Avoid alcoholic beverages for 24 hours or as instructed by your caregiver.  MEDICATIONS You may resume your normal medications unless your caregiver tells you otherwise.  WHAT YOU CAN EXPECT TODAY You may experience abdominal discomfort such as a feeling of fullness or "gas" pains.  FOLLOW-UP Your doctor will discuss the results of your test with you.  SEEK IMMEDIATE MEDICAL ATTENTION IF ANY OF THE FOLLOWING OCCUR: Excessive nausea (feeling sick to your stomach) and/or vomiting.  Severe abdominal pain and distention (swelling).  Trouble swallowing.  Temperature over 101 F (37.8 C).  Rectal bleeding or vomiting of blood.    Colonoscopy Discharge Instructions  Read the instructions outlined below and refer to this sheet in the next few weeks. These discharge instructions provide you with  general information on caring for yourself after you leave the hospital. Your doctor may also give you specific instructions. While your treatment has been planned according to the most current medical practices available, unavoidable complications occasionally occur. If you have any problems or questions after discharge, call Dr. Shaaron at [phone]. ACTIVITY You may resume your regular activity, but move at a slower pace for the next 24 hours.  Take frequent rest periods for the next 24 hours.  Walking will help get rid of the air and reduce the bloated feeling in your belly (abdomen).  No driving for 24 hours (because of the medicine (anesthesia) used during the test).   Do not sign any important legal documents or operate any machinery for 24 hours (because of the anesthesia used during the test).  NUTRITION Drink plenty of fluids.  You may resume your normal diet as instructed by your doctor.  Begin with a light meal and progress to your normal diet. Heavy or fried foods are harder to digest and may make you feel sick to your stomach (nauseated).  Avoid alcoholic beverages for 24 hours or as instructed.  MEDICATIONS You may resume your normal medications unless your doctor tells you otherwise.  WHAT YOU CAN EXPECT TODAY Some feelings of bloating in the abdomen.  Passage of more gas than usual.  Spotting of blood in your stool or on the toilet paper.  IF YOU HAD POLYPS REMOVED DURING THE COLONOSCOPY: No aspirin  products for 7 days or as instructed.  No alcohol for 7 days or as instructed.  Eat a soft diet for the next 24 hours.  FINDING  OUT THE RESULTS OF YOUR TEST Not all test results are available during your visit. If your test results are not back during the visit, make an appointment with your caregiver to find out the results. Do not assume everything is normal if you have not heard from your caregiver or the medical facility. It is important for you to follow up on all of your test  results.  SEEK IMMEDIATE MEDICAL ATTENTION IF: You have more than a spotting of blood in your stool.  Your belly is swollen (abdominal distention).  You are nauseated or vomiting.  You have a temperature over 101.  You have abdominal pain or discomfort that is severe or gets worse throughout the day.     Your esophagus was stretched today.  Your stomach looked a little inflamed.  Biopsies taken.    1 polyp found and removed for your colon today.  Diverticulosis also present  Resume Plavix  tomorrow   further recommendations to follow pending review of pathology report   at patient request, call Ellouise Slicker at [phone]-reviewed findings and recommendations

## 2024-03-02 NOTE — Op Note (Signed)
 [facility] Patient Name: [name] Procedure Date: 03/02/2024 7:09 AM MRN: [id number] Date of Birth: 1955-11-04 Attending MD: Lamar Ozell Hollingshead , MD, [id number] CSN: [id number] Age: 68 Admit Type: Inpatient Procedure:                Upper GI endoscopy Indications:              Dysphagia Providers:                Lamar Ozell Hollingshead, MD, Madelin Hunter, RN, Dorcas Lenis, Technician Referring MD:              Medicines:                Propofol  per Anesthesia Complications:            No immediate complications. Estimated Blood Loss:     Estimated blood loss was minimal. Procedure:                Pre-Anesthesia Assessment:                           - Prior to the procedure, a History and Physical                            was performed, and patient medications and                            allergies were reviewed. The patient's tolerance of                            previous anesthesia was also reviewed. The risks                            and benefits of the procedure and the sedation                            options and risks were discussed with the patient.                            All questions were answered, and informed consent                            was obtained. Prior Anticoagulants: The patient has                            taken no anticoagulant or antiplatelet agents. ASA                            Grade Assessment: III - A patient with severe                            systemic disease. After reviewing the risks and  benefits, the patient was deemed in satisfactory                            condition to undergo the procedure.                           After obtaining informed consent, the endoscope was                            passed under direct vision. Throughout the                            procedure, the patient's blood pressure, pulse, and                            oxygen  saturations were monitored continuously. The                            [id number] ([id number])Leezm was introduced through the                            mouth, and advanced to the second part of duodenum.                            The upper GI endoscopy was accomplished without                            difficulty. The patient tolerated the procedure                            well. Scope In: 7:38:24 AM Scope Out: 7:45:47 AM Total Procedure Duration: 0 hours 7 minutes 23 seconds  Findings:      A mild Schatzki ring was found at the gastroesophageal junction. No       tumor. No esophagitis.      Moderately erythematous mucosa was found in the entire examined stomach.       Patent pylorus      The duodenal bulb and second portion of the duodenum were normal. The       scope was withdrawn. Dilation was performed with a Maloney dilator with       mild resistance at 54 Fr. The scope was withdrawn. Dilation was       performed with a Maloney dilator with mild resistance at 56 Fr. The       dilation site was examined following endoscope reinsertion and showed       moderate mucosal disruption. Superficial tear through the UES and at the       GE junction. Estimated blood loss was minimal. Finally, biopsies of the       antrum and body taken for histologic study. Impression:               - Mild Schatzki ring. Dilated.                           - Erythematous mucosa in the stomach. Biopsies  taken.                           - Normal duodenal bulb and second portion of the                            duodenum. Moderate Sedation:      Moderate (conscious) sedation was personally administered by an       anesthesia professional. The following parameters were monitored: oxygen       saturation, heart rate, blood pressure, respiratory rate, EKG, adequacy       of pulmonary ventilation, and response to care. Recommendation:           - Patient has a contact number available  for                            emergencies. The signs and symptoms of potential                            delayed complications were discussed with the                            patient. Return to normal activities tomorrow.                            Written discharge instructions were provided to the                            patient.                           - Advance diet as tolerated.                           - Continue present medications.                           - Return to my office (date not yet determined).                            See colonoscopy report. Procedure Code(s):        --- Professional ---                           [phone], Esophagogastroduodenoscopy, flexible,                            transoral; diagnostic, including collection of                            specimen(s) by brushing or washing, when performed                            (separate procedure)                           43450, Dilation of esophagus, by unguided sound or  bougie, single or multiple passes Diagnosis Code(s):        --- Professional ---                           K22.2, Esophageal obstruction                           K31.89, Other diseases of stomach and duodenum                           R13.10, Dysphagia, unspecified CPT copyright 2022 American Medical Association. All rights reserved. The codes documented in this report are preliminary and upon coder review may  be revised to meet current compliance requirements. Lamar HERO. Mccall Will, MD Lamar Ozell Hollingshead, MD 03/02/2024 8:16:50 AM This report has been signed electronically. Number of Addenda: 0

## 2024-03-02 NOTE — Anesthesia Procedure Notes (Signed)
 Date/Time: 03/02/2024 7:32 AM  Performed by: Para Jerelene CROME, CRNAOxygen Delivery Method: Nasal cannula Comments: OptiFlow Nasal Cannula.

## 2024-03-02 NOTE — H&P (Signed)
 @LOGO @   Gastroenterology Progress Note    Primary Care Physician:  Duanne Butler DASEN, MD Primary Gastroenterologist:  Dr. Shaaron  Pre-Procedure History & Physical: HPI:  [name] is a 68 y.o. female here for for further evaluation and treatment of esophageal dysphagia.  Known esophageal ring.  Multiple colonic polyps removed 2022 here for surveillance colonoscopy.  On Plavix  for PAD.  No anticoagulation.  Past Medical History:  Diagnosis Date   Anxiety    Arthritis    Carotid stenosis, asymptomatic, bilateral    Cataract    Chronic left shoulder pain    Fatty liver    Headache    Hiatal hernia    small   Hyperlipidemia    Hypertension    Osteoporosis    Pre-diabetes    Schatzki's ring    Seasonal allergies    Stroke (HCC)    affected vision, but no other problems.March 2025   Tremor     Past Surgical History:  Procedure Laterality Date   ABDOMINAL AORTOGRAM W/LOWER EXTREMITY N/A 07/11/2023   Procedure: ABDOMINAL AORTOGRAM W/LOWER EXTREMITY;  Surgeon: Sheree Penne Bruckner, MD;  Location: [facility] INVASIVE CV LAB;  Service: Cardiovascular;  Laterality: N/A;   CARDIAC CATHETERIZATION  2005   [name] has essentially normal coronary arteries and normal left ventricular function.  She will be treated empirically with anti-reflux measures   CATARACT EXTRACTION, BILATERAL     CHOLECYSTECTOMY     COLONOSCOPY    06/16/2004   MFM:Wnmfjo rectum/Diminutive polyps of the splenic flexure and the cecum/The remainder of the colonic mucosa appeared normal pathology (hyperplastic polyps).   COLONOSCOPY N/A 08/07/2015   Dr. Shaaron: 6 mm descending colon tubular adenoma, multiple medium-mouthed diverticula in sigmoid colon. surveillance 2022   COLONOSCOPY N/A 04/15/2021   Procedure: COLONOSCOPY;  Surgeon: Shaaron Lamar HERO, MD;  Location: [facility] ENDO SU[facility];  Service: Endoscopy;  Laterality: N/A;  9:30am   ESOPHAGOGASTRODUODENOSCOPY  09/11/2001   MFM:Dryjusxpd ring otherwise normal  esophagus/Normal stomach/Normal D1 and D2 status post passage of a 56 French Maloney dilator   ESOPHAGOGASTRODUODENOSCOPY N/A 11/11/2017   Procedure: ESOPHAGOGASTRODUODENOSCOPY (EGD);  Surgeon: Shaaron Lamar HERO, MD; mild Schatzki ring s/p dilation and mild erosive gastropathy.   ESOPHAGOGASTRODUODENOSCOPY (EGD) WITH ESOPHAGEAL DILATION N/A 01/25/2013   Dr. Shaaron- schatzki's ring- 54 F dilation. small hiatal hernia   HYSTEROSCOPY WITH D & C N/A 03/06/2019   Procedure: DILATATION AND CURETTAGE /HYSTEROSCOPY;  Surgeon: Edsel Norleen GAILS, MD;  Location: [facility] ORS;  Service: Gynecology;  Laterality: N/A;   MALONEY DILATION N/A 11/11/2017   Procedure: AGAPITO DILATION;  Surgeon: Shaaron Lamar HERO, MD;  Location: [facility];  Service: Endoscopy;  Laterality: N/A;   PERIPHERAL VASCULAR BALLOON ANGIOPLASTY Left 07/11/2023   Procedure: PERIPHERAL VASCULAR BALLOON ANGIOPLASTY;  Surgeon: Sheree Penne Bruckner, MD;  Location: [facility] INVASIVE CV LAB;  Service: Cardiovascular;  Laterality: Left;  External Iliac   PERIPHERAL VASCULAR INTERVENTION Bilateral 07/11/2023   Procedure: PERIPHERAL VASCULAR INTERVENTION;  Surgeon: Sheree Penne Bruckner, MD;  Location: [facility] INVASIVE CV LAB;  Service: Cardiovascular;  Laterality: Bilateral;  Bilateral Common Iliac; L External Iliac   POLYPECTOMY N/A 03/06/2019   Procedure: POLYPECTOMY ( REMOVAL OF ENDOMETRIAL POLYP);  Surgeon: Edsel Norleen GAILS, MD;  Location: [facility] ORS;  Service: Gynecology;  Laterality: N/A;   POLYPECTOMY  04/15/2021   Procedure: POLYPECTOMY;  Surgeon: Shaaron Lamar HERO, MD;  Location: [facility];  Service: Endoscopy;;   TUBAL LIGATION     VENA CAVA UMBRELLA NECK APPROACH N/A  07/26/2023   Procedure: INSERTION, UMBRELLA FILTER, INFERIOR VENA CAVA FEMORAL ARTERY;  Surgeon: Sheree Penne Bruckner, MD;  Location: [facility] OR;  Service: Vascular;  Laterality: N/A;    Prior to Admission medications   Medication Sig Start Date End Date Taking? Authorizing Provider   acetaminophen  (TYLENOL ) 500 MG tablet Take 500 mg by mouth every 6 (six) hours as needed for moderate pain or headache.   Yes [provider]  ALPRAZolam  (XANAX ) 0.5 MG tablet TAKE 1 TABLET BY MOUTH 2 TIMES DAILY AS NEEDED FOR ANXIETY. 01/31/24  Yes Duanne Butler DASEN, MD  amLODipine  (NORVASC ) 10 MG tablet TAKE 1 TABLET(10 MG) BY MOUTH AT BEDTIME 10/10/23  Yes Duanne Butler DASEN, MD  carvedilol  (COREG ) 3.125 MG tablet Take 2 tablets (6.25 mg total) by mouth 2 (two) times daily with a meal. 09/15/23  Yes Duanne Butler DASEN, MD  Cholecalciferol (VITAMIN D) 50 MCG (2000 UT) CAPS Take 2,000 Units by mouth daily.   Yes [provider]  clopidogrel  (PLAVIX ) 75 MG tablet Take 1 tablet (75 mg total) by mouth daily. 07/28/23  Yes Judithe Longs C, NP  Coenzyme Q10 (COQ10) 100 MG CAPS Take 100 mg by mouth daily.   Yes [provider]  CRANBERRY PO Take 1 each by mouth 2 (two) times a week. Chewables   Yes [provider]  Cyanocobalamin  (B-12) 2500 MCG TABS Take 2,500 mcg by mouth daily.   Yes [provider]  fluticasone  (FLONASE ) 50 MCG/ACT nasal spray Place 2 sprays into both nostrils daily as needed for allergies. 01/23/23  Yes [provider]  ipratropium (ATROVENT ) 0.06 % nasal spray Place 1 spray into both nostrils 3 (three) times daily. 01/15/24  Yes [provider]  linaclotide  (LINZESS ) 72 MCG capsule Take 1 capsule (72 mcg total) by mouth daily before breakfast. 30 minutes before breakfast 01/19/24  Yes Shirlean Therisa ORN, NP  loratadine  (CLARITIN ) 10 MG tablet Take 10 mg by mouth daily as needed for allergies.   Yes [provider]  MAGNESIUM  PO Take 330 mg by mouth daily.   Yes [provider]  pantoprazole  (PROTONIX ) 40 MG tablet Take 1 tablet (40 mg total) by mouth 2 (two) times daily before a meal. 01/19/24  Yes Shirlean Therisa ORN, NP  rosuvastatin  (CRESTOR ) 20 MG tablet Take 20 mg by mouth at bedtime. 01/18/24  Yes [provider]   valsartan  (DIOVAN ) 320 MG tablet Take 1 tablet (320 mg total) by mouth daily. 09/15/23  Yes Duanne Butler DASEN, MD  vitamin E 400 UNIT capsule Take 400 Units by mouth 3 (three) times a week.   Yes [provider]  zinc gluconate 50 MG tablet Take 50 mg by mouth 3 (three) times a week.   Yes [provider]  cloNIDine  (CATAPRES ) 0.1 MG tablet Take 1 tablet (0.1 mg total) by mouth 3 (three) times daily as needed (180/100). 08/02/23   Duanne Butler DASEN, MD  EPINEPHrine  0.3 mg/0.3 mL IJ SOAJ injection Inject 0.3 mg into the muscle as needed for anaphylaxis. 03/12/22   Duanne Butler DASEN, MD  hydrochlorothiazide  (HYDRODIURIL ) 25 MG tablet Take 1 tablet (25 mg total) by mouth daily. 08/19/23   Duanne Butler DASEN, MD  hydroquinone 4 % cream Apply 1 application topically 2 (two) times daily as needed (dark spots). 01/05/21   [provider]  ipratropium (ATROVENT ) 0.03 % nasal spray INSTILL 2 SPRAYS IN EACH NOSTRIL EVERY 12 HOURS Patient taking differently: Place 2 sprays into both nostrils 2 (two)  times daily as needed for rhinitis. 04/17/21   Duanne Butler DASEN, MD  ondansetron  (ZOFRAN ) 4 MG tablet Take 1 tablet (4 mg total) by mouth every 8 (eight) hours as needed for nausea or vomiting. 10/13/23   Shirlean Therisa ORN, NP  sodium chloride  (OCEAN) 0.65 % SOLN nasal spray Place 1 spray into both nostrils as needed for congestion.    [provider]  triamcinolone  cream (KENALOG ) 0.1 % Apply 1 Application topically 2 (two) times daily as needed (irritation). 09/21/23   Duanne Butler DASEN, MD    Allergies as of 01/23/2024 - Review Complete 01/19/2024  Allergen Reaction Noted   Ciprofloxacin Shortness Of Breath 12/27/2011   Latex Other (See Comments) 10/17/2019   Vitamin c [bioflavonoid products] Itching 04/09/2021    Family History  Problem Relation Age of Onset   Diabetes Other    Diabetes Mother    Heart disease Mother    Heart disease Father    Diabetes Sister    Diabetes  Brother    Hypertension Brother    Diabetes Daughter    Hypertension Maternal Grandmother    Stroke Maternal Grandfather    Early death Paternal Grandfather    Colon cancer Neg Hx    Liver disease Neg Hx     Social History   Socioeconomic History   Marital status: Single    Spouse name: Not on file   Number of children: 3   Years of education: Not on file   Highest education level: 10th grade  Occupational History   Occupation: Public affairs consultant: [employer]  Tobacco Use   Smoking status: Former    Current packs/day: 0.00    Average packs/day: 0.3 packs/day for 35.0 years (8.8 ttl pk-yrs)    Types: Cigarettes    Start date: 12/31/1977    Quit date: 12/31/2012    Years since quitting: 11.1   Smokeless tobacco: Never  Vaping Use   Vaping status: Never Used  Substance and Sexual Activity   Alcohol use: Not Currently   Drug use: No   Sexual activity: Not Currently    Partners: Male    Birth control/protection: Post-menopausal, Surgical    Comment: tubal  Other Topics Concern   Not on file  Social History Narrative   Eats all food groups.    Wears seatbelt.    Has 3 children.   Prior smoker.   Attends church.    Used to work in Designer, fashion/clothing.    Drives.   Enjoys going out with family.    Right handed   One story home   Social Drivers of Health   Financial Resource Strain: Low Risk  (11/25/2023)   Overall Financial Resource Strain (CARDIA)    Difficulty of Paying Living Expenses: Not hard at all  Food Insecurity: No Food Insecurity (11/25/2023)   Hunger Vital Sign    Worried About Running Out of Food in the Last Year: Never true    Ran Out of Food in the Last Year: Never true  Transportation Needs: No Transportation Needs (11/25/2023)   PRAPARE - Administrator, Civil Service (Medical): No    Lack of Transportation (Non-Medical): No  Physical Activity: Insufficiently Active (11/25/2023)   Exercise Vital Sign    Days of Exercise per Week: 4  days    Minutes of Exercise per Session: 10 min  Stress: Stress Concern Present (11/25/2023)   [employer] of Occupational Health - Occupational Stress Questionnaire    Feeling of  Stress: Rather much  Social Connections: Moderately Integrated (11/25/2023)   Social Connection and Isolation Panel    Frequency of Communication with Friends and Family: More than three times a week    Frequency of Social Gatherings with Friends and Family: Once a week    Attends Religious Services: More than 4 times per year    Active Member of [employer] or Organizations: Yes    Attends Banker Meetings: More than 4 times per year    Marital Status: Widowed  Intimate Partner Violence: Not At Risk (08/02/2023)   Humiliation, Afraid, Rape, and Kick questionnaire    Fear of Current or Ex-Partner: No    Emotionally Abused: No    Physically Abused: No    Sexually Abused: No    Review of Systems   See HPI, otherwise negative ROS  Physical Exam: BP (!) 143/65   Pulse 68   Temp 98.4 F (36.9 C) (Oral)   Resp 13   Ht 5' 6 (1.676 m)   Wt 72.1 kg   SpO2 100%   BMI 25.66 kg/m  General:   Alert,  Well-developed, well-nourished, pleasant and cooperative in NAD Neck:  Supple; no masses or thyromegaly. No significant cervical adenopathy. Lungs:  Clear throughout to auscultation.   No wheezes, crackles, or rhonchi. No acute distress. Heart:  Regular rate and rhythm; no murmurs, clicks, rubs,  or gallops. Abdomen: Non-distended, normal bowel sounds.  Soft and nontender without appreciable mass or hepatosplenomegaly.   Impression/Plan:   68 year old lady here for further evaluation for dysphagia.  Also, here for surveillance colonoscopy history of multiple polyps.  On Plavix  but no anticoagulation.  I will offer an EGD with esophageal dilation as feasible/appropriate along with a surveillance colonoscopy today per plan.  The risks, benefits, limitations, imponderables and alternatives regarding both  EGD and colonoscopy have been reviewed with the patient. Questions have been answered. All parties agreeable.       Notice: This dictation was prepared with Dragon dictation along with smaller phrase technology. Any transcriptional errors that result from this process are unintentional and may not be corrected upon review.

## 2024-03-02 NOTE — Anesthesia Postprocedure Evaluation (Signed)
 Anesthesia Post Note  Patient: [name]  Procedure(s) Performed: COLONOSCOPY EGD (ESOPHAGOGASTRODUODENOSCOPY) DILATION, ESOPHAGUS  Patient location during evaluation: Phase II Anesthesia Type: General Level of consciousness: awake Pain management: pain level controlled Vital Signs Assessment: post-procedure vital signs reviewed and stable Respiratory status: spontaneous breathing and respiratory function stable Cardiovascular status: blood pressure returned to baseline and stable Postop Assessment: no headache and no apparent nausea or vomiting Anesthetic complications: no Comments: Late entry   No notable events documented.   Last Vitals:  Vitals:   03/02/24 0813 03/02/24 0821  BP: (!) 117/55 (!) 114/58  Pulse: 62   Resp: 18   Temp: 36.6 C   SpO2: 99% 100%    Last Pain:  Vitals:   03/02/24 0821  TempSrc:   PainSc: 0-No pain                 Yvonna JINNY Bosworth

## 2024-03-02 NOTE — Op Note (Signed)
 [facility] Patient Name: [name] Procedure Date: 03/02/2024 6:57 AM MRN: [id number] Date of Birth: 02-16-1956 Attending MD: Lamar Ozell Hollingshead , MD, [id number] CSN: [id number] Age: 68 Admit Type: Inpatient Procedure:                Colonoscopy Indications:              High risk colon cancer surveillance: Personal                            history of colonic polyps Providers:                Lamar Ozell Hollingshead, MD, Madelin Hunter, RN, Dorcas Lenis, Technician Referring MD:              Medicines:                Propofol  per Anesthesia Complications:            No immediate complications. Estimated Blood Loss:     Estimated blood loss was minimal. Procedure:                Pre-Anesthesia Assessment:                           - Prior to the procedure, a History and Physical                            was performed, and patient medications and                            allergies were reviewed. The patient's tolerance of                            previous anesthesia was also reviewed. The risks                            and benefits of the procedure and the sedation                            options and risks were discussed with the patient.                            All questions were answered, and informed consent                            was obtained. Prior Anticoagulants: The patient has                            taken no anticoagulant or antiplatelet agents. ASA                            Grade Assessment: III - A patient with severe  systemic disease. After reviewing the risks and                            benefits, the patient was deemed in satisfactory                            condition to undergo the procedure.                           After obtaining informed consent, the colonoscope                            was passed under direct vision. Throughout the                            procedure,  the patient's blood pressure, pulse, and                            oxygen saturations were monitored continuously. The                            [id number] ([id number]) Colon was introduced through                            the anus and advanced to the the cecum, identified                            by appendiceal orifice and ileocecal valve. The                            colonoscopy was performed without difficulty. The                            patient tolerated the procedure well. The quality                            of the bowel preparation was adequate. The                            ileocecal valve, appendiceal orifice, and rectum                            were photographed. Scope In: 7:53:57 AM Scope Out: 8:08:02 AM Scope Withdrawal Time: 0 hours 7 minutes 28 seconds  Total Procedure Duration: 0 hours 14 minutes 5 seconds  Findings:      The perianal and digital rectal examinations were normal.      Scattered medium-mouthed diverticula were found in the sigmoid colon and       descending colon.      A 5 mm polyp was found in the ascending colon. The polyp was sessile.       The polyp was removed with a cold snare. Resection and retrieval were       complete. Estimated blood loss was minimal.      The exam was otherwise without abnormality on direct  and retroflexion       views. Impression:               - Diverticulosis in the sigmoid colon and in the                            descending colon.                           - One 5 mm polyp in the ascending colon, removed                            with a cold snare. Resected and retrieved.                           - The examination was otherwise normal on direct                            and retroflexion views. Moderate Sedation:      Moderate (conscious) sedation was personally administered by an       anesthesia professional. The following parameters were monitored: oxygen       saturation, heart rate, blood pressure,  respiratory rate, EKG, adequacy       of pulmonary ventilation, and response to care. Recommendation:           - Patient has a contact number available for                            emergencies. The signs and symptoms of potential                            delayed complications were discussed with the                            patient. Return to normal activities tomorrow.                            Written discharge instructions were provided to the                            patient.                           - Advance diet as tolerated.                           - Continue present medications.                           - Repeat colonoscopy date to be determined path                            reviewed.- Return to GI office (date not yet                            determined). See EGD report. Procedure Code(s):        ---  Professional ---                           [phone], Colonoscopy, flexible; with removal of                            tumor(s), polyp(s), or other lesion(s) by snare                            technique Diagnosis Code(s):        --- Professional ---                           Z86.010, Personal history of colonic polyps                           D12.2, Benign neoplasm of ascending colon                           K57.30, Diverticulosis of large intestine without                            perforation or abscess without bleeding CPT copyright 2022 [employer]. All rights reserved. The codes documented in this report are preliminary and upon coder review may  be revised to meet current compliance requirements. [name], MD Lamar Ozell Hollingshead, MD 03/02/2024 8:19:50 AM This report has been signed electronically. Number of Addenda: 0

## 2024-03-02 NOTE — Transfer of Care (Signed)
 Immediate Anesthesia Transfer of Care Note  Patient: [name]  Procedure(s) Performed: COLONOSCOPY EGD (ESOPHAGOGASTRODUODENOSCOPY) DILATION, ESOPHAGUS  Patient Location: Short Stay  Anesthesia Type:General  Level of Consciousness: drowsy and patient cooperative  Airway & Oxygen Therapy: Patient Spontanous Breathing  Post-op Assessment: Report given to RN and Post -op Vital signs reviewed and stable  Post vital signs: Reviewed and stable  Last Vitals:  Vitals Value Taken Time  BP 117/55 03/02/24 08:13  Temp 36.6 C 03/02/24 08:13  Pulse 62 03/02/24 08:13  Resp 18 03/02/24 08:13  SpO2 99 % 03/02/24 08:13    Last Pain:  Vitals:   03/02/24 0813  TempSrc: Axillary  PainSc: 0-No pain         Complications: No notable events documented.

## 2024-03-02 NOTE — Anesthesia Preprocedure Evaluation (Signed)
 Anesthesia Evaluation  Patient identified by MRN, date of birth, ID band Patient awake    Reviewed: Allergy  & Precautions, H&P , NPO status , Patient's Chart, lab work & pertinent test results, reviewed documented beta blocker date and time   Airway Mallampati: II  TM Distance: >3 FB Neck ROM: full    Dental no notable dental hx.    Pulmonary neg pulmonary ROS, former smoker   Pulmonary exam normal breath sounds clear to auscultation       Cardiovascular Exercise Tolerance: Good hypertension, + Peripheral Vascular Disease   Rhythm:regular Rate:Normal     Neuro/Psych  Headaches  Anxiety      Neuromuscular disease CVA  negative psych ROS   GI/Hepatic Neg liver ROS, hiatal hernia,GERD  ,,  Endo/Other  diabetes    Renal/GU negative Renal ROS  negative genitourinary   Musculoskeletal   Abdominal   Peds  Hematology negative hematology ROS (+)   Anesthesia Other Findings   Reproductive/Obstetrics negative OB ROS                              Anesthesia Physical Anesthesia Plan  ASA: 3  Anesthesia Plan: General   Post-op Pain Management:    Induction:   PONV Risk Score and Plan: Propofol  infusion  Airway Management Planned:   Additional Equipment:   Intra-op Plan:   Post-operative Plan:   Informed Consent: I have reviewed the patients History and Physical, chart, labs and discussed the procedure including the risks, benefits and alternatives for the proposed anesthesia with the patient or authorized representative who has indicated his/her understanding and acceptance.     Dental Advisory Given  Plan Discussed with: CRNA  Anesthesia Plan Comments:         Anesthesia Quick Evaluation

## 2024-03-05 ENCOUNTER — Encounter (HOSPITAL_COMMUNITY): Payer: Self-pay | Admitting: Internal Medicine

## 2024-03-05 ENCOUNTER — Other Ambulatory Visit (HOSPITAL_COMMUNITY): Payer: Self-pay | Admitting: Family Medicine

## 2024-03-05 DIAGNOSIS — Z1231 Encounter for screening mammogram for malignant neoplasm of breast: Secondary | ICD-10-CM

## 2024-03-05 LAB — SURGICAL PATHOLOGY

## 2024-03-14 ENCOUNTER — Ambulatory Visit: Payer: Self-pay | Admitting: Internal Medicine

## 2024-03-23 ENCOUNTER — Ambulatory Visit (HOSPITAL_COMMUNITY)
Admission: RE | Admit: 2024-03-23 | Discharge: 2024-03-23 | Disposition: A | Discharge status: Home / Self Care | Source: Ambulatory Visit | Attending: Family Medicine | Admitting: Family Medicine

## 2024-03-23 DIAGNOSIS — Z1231 Encounter for screening mammogram for malignant neoplasm of breast: Secondary | ICD-10-CM | POA: Insufficient documentation

## 2024-03-27 ENCOUNTER — Other Ambulatory Visit: Payer: Self-pay

## 2024-03-27 ENCOUNTER — Telehealth: Payer: Self-pay

## 2024-03-27 DIAGNOSIS — Z95828 Presence of other vascular implants and grafts: Secondary | ICD-10-CM

## 2024-03-27 NOTE — Telephone Encounter (Signed)
 [name] called reporting new onset of pain in her r leg. The patient has a history of DVT.  Venous reflux ordered and scheduled for 03/28/24.  The patient was advised of the appointment time and location.

## 2024-03-28 ENCOUNTER — Ambulatory Visit (HOSPITAL_COMMUNITY)
Admission: RE | Admit: 2024-03-28 | Discharge: 2024-03-28 | Disposition: A | Discharge status: Home / Self Care | Source: Ambulatory Visit | Attending: Vascular Surgery | Admitting: Vascular Surgery

## 2024-03-28 DIAGNOSIS — Z95828 Presence of other vascular implants and grafts: Secondary | ICD-10-CM | POA: Diagnosis not present

## 2024-04-19 ENCOUNTER — Other Ambulatory Visit: Payer: Self-pay | Admitting: Family Medicine

## 2024-04-26 ENCOUNTER — Emergency Department (HOSPITAL_COMMUNITY)
Admission: EM | Admit: 2024-04-26 | Discharge: 2024-04-26 | Disposition: A | Discharge status: Home / Self Care | Source: Home / Self Care | Attending: Emergency Medicine | Admitting: Emergency Medicine

## 2024-04-26 ENCOUNTER — Emergency Department (HOSPITAL_COMMUNITY)

## 2024-04-26 ENCOUNTER — Other Ambulatory Visit: Payer: Self-pay

## 2024-04-26 DIAGNOSIS — R0789 Other chest pain: Secondary | ICD-10-CM | POA: Insufficient documentation

## 2024-04-26 DIAGNOSIS — R079 Chest pain, unspecified: Secondary | ICD-10-CM

## 2024-04-26 DIAGNOSIS — Z91040 Latex allergy status: Secondary | ICD-10-CM | POA: Diagnosis not present

## 2024-04-26 LAB — BASIC METABOLIC PANEL WITH GFR
Anion gap: 14 (ref 5–15)
BUN: 14 mg/dL (ref 8–23)
CO2: 26 mmol/L (ref 22–32)
Calcium: 9.5 mg/dL (ref 8.9–10.3)
Chloride: 100 mmol/L (ref 98–111)
Creatinine, Ser: 0.99 mg/dL (ref 0.44–1.00)
GFR, Estimated: >60 mL/min (ref >60)
Glucose, Bld: 95 mg/dL (ref 70–99)
Potassium: 3.7 mmol/L (ref 3.5–5.1)
Sodium: 139 mmol/L (ref 135–145)

## 2024-04-26 LAB — CBC
HCT: 37 % (ref 36.0–46.0)
Hemoglobin: 12.1 g/dL (ref 12.0–15.0)
MCH: 29.3 pg (ref 26.0–34.0)
MCHC: 32.7 g/dL (ref 30.0–36.0)
MCV: 89.6 fL (ref 80.0–100.0)
Platelets: 258 K/uL (ref 150–400)
RBC: 4.13 MIL/uL (ref 3.87–5.11)
RDW: 13 % (ref 11.5–15.5)
WBC: 6.8 K/uL (ref 4.0–10.5)
nRBC: 0 % (ref 0.0–0.2)

## 2024-04-26 LAB — HEPATIC FUNCTION PANEL
ALT: 17 U/L (ref 0–44)
AST: 21 U/L (ref 15–41)
Albumin: 4.6 g/dL (ref 3.5–5.0)
Alkaline Phosphatase: 63 U/L (ref 38–126)
Bilirubin, Direct: 0.3 mg/dL — ABNORMAL HIGH (ref 0.0–0.2)
Indirect Bilirubin: 0.3 mg/dL (ref 0.3–0.9)
Total Bilirubin: 0.6 mg/dL (ref 0.0–1.2)
Total Protein: 7.4 g/dL (ref 6.5–8.1)

## 2024-04-26 LAB — TROPONIN T, HIGH SENSITIVITY
Troponin T High Sensitivity: <15 ng/L (ref 0–19)
Troponin T High Sensitivity: 16 ng/L (ref 0–19)

## 2024-04-26 MED ORDER — ALUM & MAG HYDROXIDE-SIMETH 200-200-20 MG/5ML PO SUSP
30.0000 mL | Freq: Once | ORAL | Status: AC
  Administered 2024-04-26: 18:00:00 30 mL via ORAL
  Filled 2024-04-26: qty 30

## 2024-04-26 MED ORDER — ASPIRIN 81 MG PO CHEW
324.0000 mg | CHEWABLE_TABLET | Freq: Once | ORAL | Status: AC
  Administered 2024-04-26: 17:00:00 324 mg via ORAL
  Filled 2024-04-26: qty 4

## 2024-04-26 MED ORDER — LIDOCAINE VISCOUS HCL 2 % MT SOLN
15.0000 mL | Freq: Once | OROMUCOSAL | Status: AC
  Administered 2024-04-26: 18:00:00 15 mL via ORAL
  Filled 2024-04-26: qty 15

## 2024-04-26 MED ORDER — SUCRALFATE 1 G PO TABS: 1.0000 g | ORAL_TABLET | Freq: Three times a day (TID) | ORAL | 0 refills | Status: AC

## 2024-04-26 NOTE — ED Provider Notes (Signed)
 [place] EMERGENCY DEPARTMENT AT [facility] Provider Note   CSN: [id number] Arrival date & time: 04/26/24  1434     Patient presents with: Chest Pain   [name] is a 68 y.o. female.  She is here with an episode of chest pain that started on Monday sharp stabbing radiating through to her back.  Symptoms resolved but somewhat recurred today as more burning and heaviness, radiates to both shoulders.  Associated with a little bit of lower abdominal pain.  Tried nothing for her symptoms.  No history of cardiac disease.  Symptoms are improved now, seem to come and go.  No current shortness of breath but had some earlier.  No fevers or chills nausea vomiting.  {Add pertinent medical, surgical, social history, OB history to YEP:67052} The history is provided by the patient.  Chest Pain Pain location:  Substernal area Pain quality: burning and pressure   Pain radiates to:  Upper back Pain severity:  Moderate Onset quality:  Gradual Duration:  1 day Timing:  Intermittent Progression:  Improving Chronicity:  New Relieved by:  None tried Associated symptoms: abdominal pain and shortness of breath   Associated symptoms: no cough, no diaphoresis, no fever, no nausea and no vomiting   Risk factors: no smoking        Prior to Admission medications  Medication Sig Start Date End Date Taking? Authorizing Provider  acetaminophen  (TYLENOL ) 500 MG tablet Take 500 mg by mouth every 6 (six) hours as needed for moderate pain or headache.    [provider]  ALPRAZolam  (XANAX ) 0.5 MG tablet TAKE 1 TABLET BY MOUTH 2 TIMES DAILY AS NEEDED FOR ANXIETY. 01/31/24   Duanne Ishaan Villamar DASEN, MD  amLODipine  (NORVASC ) 10 MG tablet TAKE 1 TABLET(10 MG) BY MOUTH AT BEDTIME 10/10/23   Duanne Era Parr DASEN, MD  carvedilol  (COREG ) 3.125 MG tablet Take 2 tablets (6.25 mg total) by mouth 2 (two) times daily with a meal. 09/15/23   Duanne Ashlyne Olenick DASEN, MD  Cholecalciferol (VITAMIN D) 50 MCG (2000 UT)  CAPS Take 2,000 Units by mouth daily.    [provider]  cloNIDine  (CATAPRES ) 0.1 MG tablet Take 1 tablet (0.1 mg total) by mouth 3 (three) times daily as needed (180/100). 08/02/23   Duanne Jamilyn Pigeon DASEN, MD  clopidogrel  (PLAVIX ) 75 MG tablet Take 1 tablet (75 mg total) by mouth daily. 07/28/23   Lehner, Erin C, NP  Coenzyme Q10 (COQ10) 100 MG CAPS Take 100 mg by mouth daily.    [provider]  CRANBERRY PO Take 1 each by mouth 2 (two) times a week. Chewables    [provider]  Cyanocobalamin  (B-12) 2500 MCG TABS Take 2,500 mcg by mouth daily.    [provider]  EPINEPHrine  0.3 mg/0.3 mL IJ SOAJ injection Inject 0.3 mg into the muscle as needed for anaphylaxis. 03/12/22   Duanne Chondra Boyde DASEN, MD  fluticasone  (FLONASE ) 50 MCG/ACT nasal spray Place 2 sprays into both nostrils daily as needed for allergies. 01/23/23   [provider]  hydrochlorothiazide  (HYDRODIURIL ) 25 MG tablet Take 1 tablet (25 mg total) by mouth daily. 08/19/23   Duanne Devlyn Retter DASEN, MD  hydroquinone 4 % cream Apply 1 application topically 2 (two) times daily as needed (dark spots). 01/05/21   [provider]  ipratropium (ATROVENT ) 0.03 % nasal spray INSTILL 2 SPRAYS IN EACH NOSTRIL EVERY 12 HOURS Patient taking differently: Place 2 sprays into both nostrils 2 (two) times daily as needed for rhinitis. 04/17/21  Duanne Desree Leap DASEN, MD  ipratropium (ATROVENT ) 0.06 % nasal spray Place 1 spray into both nostrils 3 (three) times daily. 01/15/24   [provider]  linaclotide  (LINZESS ) 72 MCG capsule Take 1 capsule (72 mcg total) by mouth daily before breakfast. 30 minutes before breakfast 01/19/24   Shirlean Therisa ORN, NP  loratadine  (CLARITIN ) 10 MG tablet Take 10 mg by mouth daily as needed for allergies.    [provider]  MAGNESIUM  PO Take 330 mg by mouth daily.    [provider]  ondansetron  (ZOFRAN ) 4 MG tablet Take 1 tablet (4 mg total) by mouth every 8 (eight)  hours as needed for nausea or vomiting. 10/13/23   Shirlean Therisa ORN, NP  pantoprazole  (PROTONIX ) 40 MG tablet Take 1 tablet (40 mg total) by mouth 2 (two) times daily before a meal. 01/19/24   Shirlean Therisa ORN, NP  rosuvastatin  (CRESTOR ) 20 MG tablet TAKE 1 TABLET(20 MG) BY MOUTH DAILY 04/19/24   Duanne Darchelle Nunes DASEN, MD  sodium chloride  (OCEAN) 0.65 % SOLN nasal spray Place 1 spray into both nostrils as needed for congestion.    [provider]  triamcinolone  cream (KENALOG ) 0.1 % Apply 1 Application topically 2 (two) times daily as needed (irritation). 09/21/23   Duanne Tresa Jolley DASEN, MD  valsartan  (DIOVAN ) 320 MG tablet Take 1 tablet (320 mg total) by mouth daily. 09/15/23   Duanne Deakin Lacek DASEN, MD  vitamin E 400 UNIT capsule Take 400 Units by mouth 3 (three) times a week.    [provider]  zinc gluconate 50 MG tablet Take 50 mg by mouth 3 (three) times a week.    [provider]    Allergies: Ciprofloxacin, Latex, and Vitamin c [bioflavonoid products]    Review of Systems  Constitutional:  Negative for diaphoresis and fever.  Respiratory:  Positive for shortness of breath. Negative for cough.   Cardiovascular:  Positive for chest pain.  Gastrointestinal:  Positive for abdominal pain. Negative for nausea and vomiting.    Updated Vital Signs BP (!) 155/82 (BP Location: Right Arm)   Pulse 67   Temp 97.8 F (36.6 C) (Temporal)   Resp 19   Ht 5' 6 (1.676 m)   Wt 68 kg   SpO2 99%   BMI 24.21 kg/m   Physical Exam Vitals and nursing note reviewed.  Constitutional:      General: She is not in acute distress.    Appearance: Normal appearance. She is well-developed.  HENT:     Head: Normocephalic and atraumatic.  Eyes:     Conjunctiva/sclera: Conjunctivae normal.  Cardiovascular:     Rate and Rhythm: Normal rate and regular rhythm.     Heart sounds: Normal heart sounds. No murmur heard. Pulmonary:     Effort: Pulmonary effort is normal. No respiratory distress.      Breath sounds: Normal breath sounds. No stridor. No wheezing.  Abdominal:     Palpations: Abdomen is soft.     Tenderness: There is no abdominal tenderness. There is no guarding or rebound.  Musculoskeletal:        General: No tenderness or deformity.     Cervical back: Neck supple.     Right lower leg: No tenderness. No edema.     Left lower leg: No tenderness. No edema.  Skin:    General: Skin is warm and dry.     Capillary Refill: Capillary refill takes less than 2 seconds.  Neurological:     General: No focal  deficit present.     Mental Status: She is alert.     GCS: GCS eye subscore is 4. GCS verbal subscore is 5. GCS motor subscore is 6.     (all labs ordered are listed, but only abnormal results are displayed) Labs Reviewed  CBC  BASIC METABOLIC PANEL WITH GFR  TROPONIN T, HIGH SENSITIVITY    EKG: EKG Interpretation Date/Time:  Thursday April 26 2024 15:14:53 EST Ventricular Rate:  68 PR Interval:  160 QRS Duration:  84 QT Interval:  410 QTC Calculation: 435 R Axis:   46  Text Interpretation: Normal sinus rhythm Nonspecific T wave abnormality Abnormal ECG When compared with ECG of 05-Dec-2023 20:12, No significant change was found Confirmed by Towana Sharper [phone]) on 04/26/2024 3:25:19 PM  Radiology: DG Chest 2 View Result Date: 04/26/2024 EXAM: 2 VIEW(S) XRAY OF THE CHEST 04/26/2024 03:25:00 PM COMPARISON: 08/07/2023 CLINICAL HISTORY: CP FINDINGS: LUNGS AND PLEURA: No focal pulmonary opacity. No pleural effusion. No pneumothorax. HEART AND MEDIASTINUM: Partially visualized IVC filter. Aortic calcification. BONES AND SOFT TISSUES: Right upper quadrant surgical clips. No acute osseous abnormality. IMPRESSION: 1. No acute cardiopulmonary abnormality. 2. Aortic atherosclerosis. Electronically signed by: Greig Pique MD 04/26/2024 03:46 PM EST RP Workstation: [id number]    {Document cardiac monitor, telemetry assessment procedure when  appropriate:32947} Procedures   Medications Ordered in the ED  aspirin  chewable tablet 324 mg (has no administration in time range)      {Click here for ABCD2, HEART and other calculators REFRESH Note before signing:1}                              Medical Decision Making Amount and/or Complexity of Data Reviewed Labs: ordered. Radiology: ordered.  Risk OTC drugs.   This patient complains of ***; this involves an extensive number of treatment Options and is a complaint that carries with it a high risk of complications and morbidity. The differential includes ***  I ordered, reviewed and interpreted labs, which included *** I ordered medication *** and reviewed PMP when indicated. I ordered imaging studies which included *** and I independently    visualized and interpreted imaging which showed *** Additional history obtained from *** Previous records obtained and reviewed *** I consulted *** and discussed lab and imaging findings and discussed disposition.  Cardiac monitoring reviewed, *** Social determinants considered, *** Critical Interventions: ***  After the interventions stated above, I reevaluated the patient and found *** Admission and further testing considered, ***   {Document critical care time when appropriate  Document review of labs and clinical decision tools ie CHADS2VASC2, etc  Document your independent review of radiology images and any outside records  Document your discussion with family members, caretakers and with consultants  Document social determinants of health affecting pt's care  Document your decision making why or why not admission, treatments were needed:32947:::1}   Final diagnoses:  None    ED Discharge Orders     None

## 2024-04-26 NOTE — ED Triage Notes (Signed)
 Pt CP across and radiates to back, sharp pains to mid back on Monday.  Today came back as burning and heaviness, pain to both shoulders.  Mild SOB per pt.

## 2024-04-26 NOTE — Discharge Instructions (Signed)
 You were seen in the emergency department for burning chest pain.  You had blood work EKG and chest x-ray that did not show an obvious explanation for your symptoms.  Please continue your acid medication and we are adding another medication to help with possible acid.  Follow-up with your regular doctor.  Return to the emergency department if any worsening or concerning symptoms.

## 2024-05-06 ENCOUNTER — Other Ambulatory Visit: Payer: Self-pay | Admitting: Family Medicine

## 2024-05-13 NOTE — Progress Notes (Unsigned)
 " CARDIOLOGY CONSULT NOTE       Patient ID: [name] MRN: [id number] DOB/AGE: 12/26/1955 69 y.o.  Referring Physician: Duanne Primary Physician: [facility] Primary Cardiologist: Delford    HPI:  69 y.o. with labile HTN, HLD, history of esophageal web with dilatation and GERD Seen in [facility] ER October 2020  For hypertensive urgency BP 190 systolic. Dr Duanne had started her on clonidine  patch and adjusted her ARB Complained of fluttering in chest for 4 days Compliant with meds Labs ok except K 3.3 Troponin 18->19 no trend Not sure why checked as she had no chest pain. CXR NAD CT head No acute findings Recommended admission But left AMA ECG non acute with LAE non LVH no acute ST changes 02/02/19 Previous smoker quit in 2014  She has no documented history of CAD  Had normal cath 2014 with pain ascribed to GI cause  Non ischemic myovue November 2021  and 08/22/20 EF 62%  PET/CT 04/28/22 normal with stress EF 66% normal blood flow mild 3 vessel calcium  noted    Follows with Dr Sheree [facility] for her PVD Carotid: 03/09/23 right 60-79% left 40-59% ABI: right 0.62 and left 0.81   She has a long standing tremor which is stable with no signs Parkinson's   BP has been labile and elevated Current regimen includes  Norvasc  10 mg  Coreg  3.125 bid Clonidine  0.1 PRN Lasix  20 mg Valsartan  320 Aldactone  25 mg  06/21/22 complained to primary of palpitations Near daily lasting minutes More skips not sustained rapid beating Feels it in her throat and makes her feel queezy  ECG in office was NSR She now feels better and is on coreg  already Monitor 08/03/22 PVCls no significant arrhythmias  Some stress at home Has a brother and two grand kids living with her Right leg gets a little weak with Walking Pain seems worse and does get some rest pain at night Discussed starting pletal  and seeing Dr Sheree sooner for ABI may need angiogram. Left knee more swollen and needs f/u with orhto post arthroscopic  surgery last year Most recent ABIs 01/04/24 0.66 on right and 0.84 on left She had stents placed in March complicated by CVA with some visual field cuts. She has bleeding on DOAC and ended up with IVC filter for history of DVT   Seen in ED 12/23 with back/chest pain r/o  CTA negative PE  CTA for facial pain 05/16/22 with 70% right carotid bifurcation   03/02/24 EGD with Schatzki ring and inflammation of stomach  Venous duplex 03/28/24 with reflux on right   Seen in ED 04/27/24 with atypical sharp chest pain and abdominal pain Rx with carafate  and D/c  R/O no acute ECG changes CXR NAD  No chest pains since ER      ROS All other systems reviewed and negative except as noted above  Past Medical History:  Diagnosis Date   Anxiety    Arthritis    Carotid stenosis, asymptomatic, bilateral    Cataract    Chronic left shoulder pain    Fatty liver    Headache    Hiatal hernia    small   Hyperlipidemia    Hypertension    Osteoporosis    Pre-diabetes    Schatzki's ring    Seasonal allergies    Stroke (HCC)    affected vision, but no other problems.March 2025   Tremor     Family History  Problem Relation Age of Onset  Diabetes Other    Diabetes Mother    Heart disease Mother    Heart disease Father    Diabetes Sister    Diabetes Brother    Hypertension Brother    Diabetes Daughter    Hypertension Maternal Grandmother    Stroke Maternal Grandfather    Early death Paternal Grandfather    Colon cancer Neg Hx    Liver disease Neg Hx     Social History   Socioeconomic History   Marital status: Single    Spouse name: Not on file   Number of children: 3   Years of education: Not on file   Highest education level: 10th grade  Occupational History   Occupation: Public Affairs Consultant: [employer]  Tobacco Use   Smoking status: Former    Current packs/day: 0.00    Average packs/day: 0.3 packs/day for 35.0 years (8.8 ttl pk-yrs)    Types: Cigarettes    Start  date: 12/31/1977    Quit date: 12/31/2012    Years since quitting: 11.3   Smokeless tobacco: Never  Vaping Use   Vaping status: Never Used  Substance and Sexual Activity   Alcohol use: Not Currently   Drug use: No   Sexual activity: Not Currently    Partners: Male    Birth control/protection: Post-menopausal, Surgical    Comment: tubal  Other Topics Concern   Not on file  Social History Narrative   Eats all food groups.    Wears seatbelt.    Has 3 children.   Prior smoker.   Attends church.    Used to work in designer, fashion/clothing.    Drives.   Enjoys going out with family.    Right handed   One story home   Social Drivers of Health   Tobacco Use: Medium Risk (03/02/2024)   Patient History    Smoking Tobacco Use: Former    Smokeless Tobacco Use: Never    Passive Exposure: Not on file  Financial Resource Strain: Low Risk (11/25/2023)   Overall Financial Resource Strain (CARDIA)    Difficulty of Paying Living Expenses: Not hard at all  Food Insecurity: No Food Insecurity (11/25/2023)   Epic    Worried About Programme Researcher, Broadcasting/film/video in the Last Year: Never true    Ran Out of Food in the Last Year: Never true  Transportation Needs: No Transportation Needs (11/25/2023)   Epic    Lack of Transportation (Medical): No    Lack of Transportation (Non-Medical): No  Physical Activity: Insufficiently Active (11/25/2023)   Exercise Vital Sign    Days of Exercise per Week: 4 days    Minutes of Exercise per Session: 10 min  Stress: Stress Concern Present (11/25/2023)   [employer] of Occupational Health - Occupational Stress Questionnaire    Feeling of Stress: Rather much  Social Connections: Moderately Integrated (11/25/2023)   Social Connection and Isolation Panel    Frequency of Communication with Friends and Family: More than three times a week    Frequency of Social Gatherings with Friends and Family: Once a week    Attends Religious Services: More than 4 times per year    Active Member of  [employer] or Organizations: Yes    Attends Banker Meetings: More than 4 times per year    Marital Status: Widowed  Intimate Partner Violence: Not At Risk (08/02/2023)   Humiliation, Afraid, Rape, and Kick questionnaire    Fear of Current or Ex-Partner: No  Emotionally Abused: No    Physically Abused: No    Sexually Abused: No  Depression (PHQ2-9): Low Risk (09/15/2023)   Depression (PHQ2-9)    PHQ-2 Score: 0  Alcohol Screen: Low Risk (06/02/2023)   Alcohol Screen    Last Alcohol Screening Score (AUDIT): 0  Housing: Low Risk (11/25/2023)   Epic    Unable to Pay for Housing in the Last Year: No    Number of Times Moved in the Last Year: 0    Homeless in the Last Year: No  Utilities: Not At Risk (08/02/2023)   AHC Utilities    Threatened with loss of utilities: No  Health Literacy: Adequate Health Literacy (06/02/2023)   B1300 Health Literacy    Frequency of need for help with medical instructions: Never    Past Surgical History:  Procedure Laterality Date   ABDOMINAL AORTOGRAM W/LOWER EXTREMITY N/A 07/11/2023   Procedure: ABDOMINAL AORTOGRAM W/LOWER EXTREMITY;  Surgeon: Sheree Penne Bruckner, MD;  Location: [facility] INVASIVE CV LAB;  Service: Cardiovascular;  Laterality: N/A;   CARDIAC CATHETERIZATION  2005   Ms. Glasscock has essentially normal coronary arteries and normal left ventricular function.  She will be treated empirically with anti-reflux measures   CATARACT EXTRACTION, BILATERAL     CHOLECYSTECTOMY     COLONOSCOPY    06/16/2004   MFM:Wnmfjo rectum/Diminutive polyps of the splenic flexure and the cecum/The remainder of the colonic mucosa appeared normal pathology (hyperplastic polyps).   COLONOSCOPY N/A 08/07/2015   Dr. Shaaron: 6 mm descending colon tubular adenoma, multiple medium-mouthed diverticula in sigmoid colon. surveillance 2022   COLONOSCOPY N/A 04/15/2021   Procedure: COLONOSCOPY;  Surgeon: Shaaron Lamar HERO, MD;  Location: [facility];  Service: Endoscopy;   Laterality: N/A;  9:30am   COLONOSCOPY N/A 03/02/2024   Procedure: COLONOSCOPY;  Surgeon: Shaaron Lamar HERO, MD;  Location: [facility];  Service: Endoscopy;  Laterality: N/A;  7:30 am ,asa 3   ESOPHAGEAL DILATION N/A 03/02/2024   Procedure: DILATION, ESOPHAGUS;  Surgeon: Shaaron Lamar HERO, MD;  Location: [facility];  Service: Endoscopy;  Laterality: N/A;   ESOPHAGOGASTRODUODENOSCOPY  09/11/2001   MFM:Dryjusxpd ring otherwise normal esophagus/Normal stomach/Normal D1 and D2 status post passage of a 56 French Maloney dilator   ESOPHAGOGASTRODUODENOSCOPY N/A 11/11/2017   Procedure: ESOPHAGOGASTRODUODENOSCOPY (EGD);  Surgeon: Shaaron Lamar HERO, MD; mild Schatzki ring s/p dilation and mild erosive gastropathy.   ESOPHAGOGASTRODUODENOSCOPY N/A 03/02/2024   Procedure: EGD (ESOPHAGOGASTRODUODENOSCOPY);  Surgeon: Shaaron Lamar HERO, MD;  Location: [facility];  Service: Endoscopy;  Laterality: N/A;   ESOPHAGOGASTRODUODENOSCOPY (EGD) WITH ESOPHAGEAL DILATION N/A 01/25/2013   Dr. Shaaron- schatzki's ring- 54 F dilation. small hiatal hernia   HYSTEROSCOPY WITH D & C N/A 03/06/2019   Procedure: DILATATION AND CURETTAGE /HYSTEROSCOPY;  Surgeon: Edsel Norleen GAILS, MD;  Location: [facility];  Service: Gynecology;  Laterality: N/A;   MALONEY DILATION N/A 11/11/2017   Procedure: AGAPITO DILATION;  Surgeon: Shaaron Lamar HERO, MD;  Location: [facility];  Service: Endoscopy;  Laterality: N/A;   PERIPHERAL VASCULAR BALLOON ANGIOPLASTY Left 07/11/2023   Procedure: PERIPHERAL VASCULAR BALLOON ANGIOPLASTY;  Surgeon: Sheree Penne Bruckner, MD;  Location: [facility] INVASIVE CV LAB;  Service: Cardiovascular;  Laterality: Left;  External Iliac   PERIPHERAL VASCULAR INTERVENTION Bilateral 07/11/2023   Procedure: PERIPHERAL VASCULAR INTERVENTION;  Surgeon: Sheree Penne Bruckner, MD;  Location: [facility] INVASIVE CV LAB;  Service: Cardiovascular;  Laterality: Bilateral;  Bilateral Common Iliac; L External Iliac   POLYPECTOMY N/A 03/06/2019    Procedure:  POLYPECTOMY ( REMOVAL OF ENDOMETRIAL POLYP);  Surgeon: Edsel Norleen GAILS, MD;  Location: [facility];  Service: Gynecology;  Laterality: N/A;   POLYPECTOMY  04/15/2021   Procedure: POLYPECTOMY;  Surgeon: Shaaron Lamar HERO, MD;  Location: [facility];  Service: Endoscopy;;   TUBAL LIGATION     VENA CAVA UMBRELLA NECK APPROACH N/A 07/26/2023   Procedure: INSERTION, UMBRELLA FILTER, INFERIOR VENA CAVA FEMORAL ARTERY;  Surgeon: Sheree Penne Bruckner, MD;  Location: [facility] OR;  Service: Vascular;  Laterality: N/A;        Physical Exam: There were no vitals taken for this visit.    Affect appropriate Healthy:  appears stated age HEENT: normal Neck supple with no adenopathy JVP bilateral  carotid bruit Lungs clear with no wheezing and good diaphragmatic motion Heart:  S1/S2 short 2/6 SEM murmur, no rub, gallop or click PMI normal Abdomen: benighn, BS positve, no tenderness, no AAA no bruit.  No HSM or HJR Decreased pedal pulses on right Varicosities in legs  Neuro non-focal UE tremors  Skin warm and dry Post arthroscopy left knee  Decreased pedal pulses    Labs:   Lab Results  Component Value Date   WBC 6.8 04/26/2024   HGB 12.1 04/26/2024   HCT 37.0 04/26/2024   MCV 89.6 04/26/2024   PLT 258 04/26/2024    No results for input(s): NA, K, CL, CO2, BUN, CREATININE, CALCIUM , PROT, BILITOT, ALKPHOS, ALT, AST, GLUCOSE in the last 168 hours.  Invalid input(s): LABALBU  Lab Results  Component Value Date   CKTOTAL 78 06/10/2020   TROPONINI <0.03 12/28/2017    Lab Results  Component Value Date   CHOL 164 11/25/2023   CHOL 146 07/12/2023   CHOL 175 06/11/2022   Lab Results  Component Value Date   HDL 62 11/25/2023   HDL 60 07/12/2023   HDL 62 06/11/2022   Lab Results  Component Value Date   LDLCALC 84 11/25/2023   LDLCALC 75 07/12/2023   LDLCALC 94 06/11/2022   Lab Results  Component Value Date   TRIG 89 11/25/2023   TRIG 55  07/27/2023   TRIG 56 07/12/2023   Lab Results  Component Value Date   CHOLHDL 2.6 11/25/2023   CHOLHDL 2.4 07/12/2023   CHOLHDL 2.8 06/11/2022   No results found for: LDLDIRECT    Radiology: DG Chest 2 View Result Date: 04/26/2024 EXAM: 2 VIEW(S) XRAY OF THE CHEST 04/26/2024 03:25:00 PM COMPARISON: 08/07/2023 CLINICAL HISTORY: CP FINDINGS: LUNGS AND PLEURA: No focal pulmonary opacity. No pleural effusion. No pneumothorax. HEART AND MEDIASTINUM: Partially visualized IVC filter. Aortic calcification. BONES AND SOFT TISSUES: Right upper quadrant surgical clips. No acute osseous abnormality. IMPRESSION: 1. No acute cardiopulmonary abnormality. 2. Aortic atherosclerosis. Electronically signed by: Greig Pique MD 04/26/2024 03:46 PM EST RP Workstation: [id number]    EKG: 05/02/23 SR rate 74 nonspecific ST changes poor R wave progression    ASSESSMENT AND PLAN:   1. CAD:  multiple normal stress tests most recent PET/CT 04/28/22 mild 3 vessel calcium  noted with normal perfusion , stress EF and blood flow Given recent ER visit and PVD will update Lexiscan  myovue 2. HTN:  well controlled on multi drug regimen  3.  HLD:  On statin labs with primary  4. Carotid Dx:  f/u Dr Sheree right ICA 60-79% f/u duplex 02/2023  5. GI: history of GERD and esophageal dilatation seen by GI recently and repeat EGD  with dilatation and erythema in stomach Small Schatzki ring.  Linzess  and  protonix   6. PVD:  moderate decrease in ABI on right  post stenting complicated by CVA. F/U Dr Sheree 7. Ortho: left knee arthroscopy improved  8. Palpitations: on beta blocker with low pulse Likely PACls improved no need for monitor  9. Varicosities:  known bilateral reflux Diuretics f/u [facility]  10.  DVT:  history of with IVC filter now. Had hemorrhagic transformation if PCA stroke post procedure   Lexiscan  myovue  F/U in a year  F/U [facility] Sheree      Signed: Maude Emmer 05/14/2024, 1:14 PM   "

## 2024-05-14 ENCOUNTER — Ambulatory Visit: Attending: Cardiovascular Disease | Admitting: Cardiovascular Disease

## 2024-05-14 ENCOUNTER — Encounter: Payer: Self-pay | Admitting: Cardiovascular Disease

## 2024-05-14 VITALS — BP 142/62 | HR 71 | Ht 67.0 in | Wt 152.4 lb

## 2024-05-14 DIAGNOSIS — R079 Chest pain, unspecified: Secondary | ICD-10-CM | POA: Diagnosis not present

## 2024-05-14 DIAGNOSIS — I6523 Occlusion and stenosis of bilateral carotid arteries: Secondary | ICD-10-CM | POA: Diagnosis not present

## 2024-05-14 DIAGNOSIS — Z95828 Presence of other vascular implants and grafts: Secondary | ICD-10-CM | POA: Diagnosis not present

## 2024-05-14 DIAGNOSIS — R0989 Other specified symptoms and signs involving the circulatory and respiratory systems: Secondary | ICD-10-CM | POA: Diagnosis not present

## 2024-05-14 DIAGNOSIS — E785 Hyperlipidemia, unspecified: Secondary | ICD-10-CM

## 2024-05-14 DIAGNOSIS — I739 Peripheral vascular disease, unspecified: Secondary | ICD-10-CM | POA: Diagnosis not present

## 2024-05-14 NOTE — Patient Instructions (Signed)
 Medication Instructions:   Your physician recommends that you continue on your current medications as directed. Please refer to the Current Medication list given to you today.   Labwork: None today  Testing/Procedures: Your physician has requested that you have a lexiscan  myoview . For further information please visit [email or web address] Please follow instruction sheet, as given.   Follow-Up: 1 year Dr.Nishan  Any Other Special Instructions Will Be Listed Below (If Applicable).  If you need a refill on your cardiac medications before your next appointment, please call your pharmacy.

## 2024-05-21 ENCOUNTER — Ambulatory Visit: Payer: Self-pay | Admitting: Cardiovascular Disease

## 2024-05-21 ENCOUNTER — Encounter (HOSPITAL_COMMUNITY)
Admission: RE | Admit: 2024-05-21 | Discharge: 2024-05-21 | Disposition: A | Discharge status: Home / Self Care | Source: Ambulatory Visit | Attending: Cardiovascular Disease | Admitting: Cardiovascular Disease

## 2024-05-21 ENCOUNTER — Encounter (HOSPITAL_COMMUNITY): Admission: RE | Admit: 2024-05-21 | Source: Ambulatory Visit

## 2024-05-21 ENCOUNTER — Other Ambulatory Visit: Payer: Self-pay | Admitting: Physician Assistant

## 2024-05-21 DIAGNOSIS — R079 Chest pain, unspecified: Secondary | ICD-10-CM | POA: Diagnosis present

## 2024-05-21 LAB — NM MYOCAR MULTI W/SPECT W/WALL MOTION / EF
Base ST Depression (mm): 0 mm
LV dias vol: 66 mL (ref 46–106)
LV sys vol: 24 mL
MPHR: 150 {beats}/min
Nuc Stress EF: 64 %
Peak HR: 96 {beats}/min
Percent HR: 64 %
RATE: 0.2
Rest HR: 65 {beats}/min
Rest Nuclear Isotope Dose: 10 mCi
SDS: 3
SRS: 1
SSS: 4
ST Depression (mm): 0 mm
Stress Nuclear Isotope Dose: 28.5 mCi
TID: 1.07

## 2024-05-21 MED ORDER — SODIUM CHLORIDE FLUSH 0.9 % IV SOLN
INTRAVENOUS | Status: AC
  Administered 2024-05-21: 10 mL via INTRAVENOUS
  Filled 2024-05-21: qty 10

## 2024-05-21 MED ORDER — TECHNETIUM TC 99M TETROFOSMIN IV KIT
10.0000 | PACK | Freq: Once | INTRAVENOUS | Status: AC | PRN
  Administered 2024-05-21: 10 via INTRAVENOUS

## 2024-05-21 MED ORDER — REGADENOSON 0.4 MG/5ML IV SOLN
INTRAVENOUS | Status: AC
  Administered 2024-05-21: 0.4 mg via INTRAVENOUS
  Filled 2024-05-21: qty 5

## 2024-05-21 MED ORDER — TECHNETIUM TC 99M TETROFOSMIN IV KIT
30.0000 | PACK | Freq: Once | INTRAVENOUS | Status: AC | PRN
  Administered 2024-05-21: 28.5 via INTRAVENOUS

## 2024-05-21 NOTE — Progress Notes (Signed)
" °  ° °  [name] presented for a Lexiscan  nuclear stress test today.  I Lorette CINDERELLA Kapur, PA-C, provided direct supervision and was present during the stress portion of the study today, which was completed without significant symptoms, immediate complications, or acute ST/T changes on ECG.  Stress imaging is pending at this time.  Preliminary ECG findings may be listed in the chart, but the stress test result will not be finalized until perfusion imaging is complete.  Lorette CINDERELLA Kapur, PA-C  05/21/2024, 11:47 AM    "

## 2024-05-23 ENCOUNTER — Encounter: Payer: Self-pay | Admitting: Sleep Medicine

## 2024-05-23 ENCOUNTER — Ambulatory Visit: Admitting: Sleep Medicine

## 2024-05-23 VITALS — BP 130/50 | HR 67 | Temp 98.7°F | Ht 67.0 in | Wt 150.8 lb

## 2024-05-23 DIAGNOSIS — I6389 Other cerebral infarction: Secondary | ICD-10-CM

## 2024-05-23 DIAGNOSIS — G4733 Obstructive sleep apnea (adult) (pediatric): Secondary | ICD-10-CM

## 2024-05-23 DIAGNOSIS — Z87891 Personal history of nicotine dependence: Secondary | ICD-10-CM

## 2024-05-23 DIAGNOSIS — G4710 Hypersomnia, unspecified: Secondary | ICD-10-CM

## 2024-05-23 DIAGNOSIS — I10 Essential (primary) hypertension: Secondary | ICD-10-CM

## 2024-05-23 NOTE — Progress Notes (Signed)
 "      Name:[name] MRN: [id number] DOB: [birth year]   CHIEF COMPLAINT:  EXCESSIVE DAYTIME SLEEPINESS   HISTORY OF PRESENT ILLNESS: [name] is a 69 y.o. w/ a h/o HTN, h/o CVA, hyperlipidemia, anxiety, and GERD who presents for c/o loud snoring, witnessed apnea and occasional daytime sleepiness which has been present for several years. Reports nocturnal awakenings due to headaches and occasionally has difficulty falling back to sleep. Reports significant weight changes. Admits to night sweats. Denies morning headaches, RLS symptoms, dream enactment, cataplexy, hypnagogic or hypnapompic hallucinations. Reports a family history of sleep apnea. Denies drowsy driving. Drinks soda occasionally and 1 cup of decaf coffee a few days per week, denies alcohol, tobacco or illicit drug use.   Bedtime 9-10 pm Sleep onset 1 hour  Rise time 7-9 am   EPWORTH SLEEP SCORE 7   PAST MEDICAL HISTORY :   has a past medical history of Allergy , Anxiety, Arthritis, Carotid stenosis, asymptomatic, bilateral, Cataract, Chronic left shoulder pain, Fatty liver, GERD (gastroesophageal reflux disease), Headache, Hiatal hernia, Hyperlipidemia, Hypertension, Osteoporosis, Pre-diabetes, Schatzki's ring, Seasonal allergies, Stroke (HCC), and Tremor.  has a past surgical history that includes Cholecystectomy; Tubal ligation; Esophagogastroduodenoscopy (09/11/2001); Colonoscopy (06/16/2004); Esophagogastroduodenoscopy (egd) with esophageal dilation (N/A, 01/25/2013); Colonoscopy (N/A, 08/07/2015); Cataract extraction, bilateral; Esophagogastroduodenoscopy (N/A, 11/11/2017); maloney dilation (N/A, 11/11/2017); Cardiac catheterization (2005); Hysteroscopy with D & C (N/A, 03/06/2019); polypectomy (N/A, 03/06/2019); Colonoscopy (N/A, 04/15/2021); polypectomy (04/15/2021); ABDOMINAL AORTOGRAM W/LOWER EXTREMITY (N/A, 07/11/2023); PERIPHERAL VASCULAR INTERVENTION (Bilateral, 07/11/2023); PERIPHERAL VASCULAR BALLOON  ANGIOPLASTY (Left, 07/11/2023); VENA CAVA UMBRELLA NECK APPROACH (N/A, 07/26/2023); Colonoscopy (N/A, 03/02/2024); Esophagogastroduodenoscopy (N/A, 03/02/2024); Esophageal dilation (N/A, 03/02/2024); and Eye surgery. Prior to Admission medications  Medication Sig Start Date End Date Taking? Authorizing Provider  acetaminophen  (TYLENOL ) 500 MG tablet Take 500 mg by mouth every 6 (six) hours as needed for moderate pain or headache.   Yes [provider]  amLODipine  (NORVASC ) 10 MG tablet TAKE 1 TABLET(10 MG) BY MOUTH AT BEDTIME 10/10/23  Yes Duanne Butler DASEN, MD  busPIRone (BUSPAR) 5 MG tablet Take 5 mg by mouth daily. 04/25/24  Yes [provider]  carvedilol  (COREG ) 3.125 MG tablet Take 2 tablets (6.25 mg total) by mouth 2 (two) times daily with a meal. 09/15/23  Yes Duanne Butler DASEN, MD  cetirizine (ZYRTEC) 10 MG tablet Take 10 mg by mouth daily as needed.   Yes [provider]  Cholecalciferol (VITAMIN D) 50 MCG (2000 UT) CAPS Take 2,000 Units by mouth daily.   Yes [provider]  cloNIDine  (CATAPRES ) 0.1 MG tablet Take 1 tablet (0.1 mg total) by mouth 3 (three) times daily as needed (180/100). 08/02/23  Yes Duanne Butler DASEN, MD  clopidogrel  (PLAVIX ) 75 MG tablet Take 1 tablet (75 mg total) by mouth daily. 07/28/23  Yes Judithe Longs C, NP  Coenzyme Q10 (COQ10) 100 MG CAPS Take 100 mg by mouth daily.   Yes [provider]  CRANBERRY PO Take 1 each by mouth 2 (two) times a week. Chewables   Yes [provider]  Cyanocobalamin  (B-12) 2500 MCG TABS Take 2,500 mcg by mouth daily.   Yes [provider]  EPINEPHrine  0.3 mg/0.3 mL IJ SOAJ injection Inject 0.3 mg into the muscle as needed for anaphylaxis. 03/12/22  Yes Duanne Butler DASEN, MD  fluticasone  (FLONASE ) 50 MCG/ACT nasal spray Place 2 sprays into both nostrils daily as needed for allergies. 01/23/23  Yes [provider]  hydrALAZINE  (APRESOLINE ) 10 MG tablet Take 10 mg  by mouth 2  (two) times daily. 04/25/24  Yes [provider]  hydroquinone 4 % cream Apply 1 application topically 2 (two) times daily as needed (dark spots). 01/05/21  Yes [provider]  ipratropium (ATROVENT ) 0.03 % nasal spray INSTILL 2 SPRAYS IN EACH NOSTRIL EVERY 12 HOURS Patient taking differently: Place 2 sprays into both nostrils 2 (two) times daily as needed for rhinitis. 04/17/21  Yes Duanne Butler DASEN, MD  ipratropium (ATROVENT ) 0.06 % nasal spray Place 1 spray into both nostrils 3 (three) times daily. 01/15/24  Yes [provider]  linaclotide  (LINZESS ) 72 MCG capsule Take 1 capsule (72 mcg total) by mouth daily before breakfast. 30 minutes before breakfast 01/19/24  Yes Shirlean Therisa ORN, NP  loratadine  (CLARITIN ) 10 MG tablet Take 10 mg by mouth daily as needed for allergies.   Yes [provider]  MAGNESIUM  PO Take 330 mg by mouth daily.   Yes [provider]  ondansetron  (ZOFRAN ) 4 MG tablet Take 1 tablet (4 mg total) by mouth every 8 (eight) hours as needed for nausea or vomiting. 10/13/23  Yes Shirlean Therisa ORN, NP  pantoprazole  (PROTONIX ) 40 MG tablet Take 1 tablet (40 mg total) by mouth 2 (two) times daily before a meal. 01/19/24  Yes Shirlean Therisa ORN, NP  rosuvastatin  (CRESTOR ) 40 MG tablet Take 40 mg by mouth daily.   Yes [provider]  sodium chloride  (OCEAN) 0.65 % SOLN nasal spray Place 1 spray into both nostrils as needed for congestion.   Yes [provider]  sucralfate  (CARAFATE ) 1 g tablet Take 1 tablet (1 g total) by mouth 4 (four) times daily -  with meals and at bedtime. 04/26/24  Yes Towana Ozell BROCKS, MD  triamcinolone  cream (KENALOG ) 0.1 % Apply 1 Application topically 2 (two) times daily as needed (irritation). 09/21/23  Yes Duanne Butler DASEN, MD  valsartan  (DIOVAN ) 320 MG tablet Take 1 tablet (320 mg total) by mouth daily. 09/15/23  Yes Duanne Butler DASEN, MD  vitamin E 400 UNIT capsule Take 400 Units by mouth 3 (three) times a week.    Yes [provider]  zinc gluconate 50 MG tablet Take 50 mg by mouth 3 (three) times a week.   Yes [provider]  ALPRAZolam  (XANAX ) 0.5 MG tablet TAKE 1 TABLET BY MOUTH 2 TIMES DAILY AS NEEDED FOR ANXIETY. Patient not taking: Reported on 05/23/2024 01/31/24   Duanne Butler DASEN, MD  hydrochlorothiazide  (HYDRODIURIL ) 25 MG tablet Take 1 tablet (25 mg total) by mouth daily. Patient not taking: Reported on 05/23/2024 08/19/23   Duanne Butler DASEN, MD   Allergies[1]  FAMILY HISTORY:  family history includes Diabetes in her brother, brother, daughter, mother, sister, and another family member; Early death in her brother and paternal grandfather; Heart disease in her father and mother; Hypertension in her brother and maternal grandmother; Stroke in her maternal grandfather; Varicose Veins in her father. SOCIAL HISTORY:  reports that she quit smoking about 11 years ago. Her smoking use included cigarettes. She started smoking about 46 years ago. She has a 8.8 pack-year smoking history. She has never used smokeless tobacco. She reports that she does not currently use alcohol. She reports that she does not use drugs.   Review of Systems:  Gen:  Denies  fever, sweats, chills weight loss  HEENT: Denies blurred vision, double vision, ear pain, eye pain, hearing loss, nose bleeds, sore throat Cardiac:  No dizziness, chest pain or heaviness, chest tightness,edema, No JVD  Resp:   No cough, -sputum production, -shortness of breath,-wheezing, -hemoptysis,  Gi: Denies swallowing difficulty, stomach pain, nausea or vomiting, diarrhea, constipation, bowel incontinence Gu:  Denies bladder incontinence, burning urine Ext:   Denies Joint pain, stiffness or swelling Skin: Denies  skin rash, easy bruising or bleeding or hives Endoc:  Denies polyuria, polydipsia , polyphagia or weight change Psych:   Denies depression, insomnia or hallucinations  Other:  All other systems negative  VITAL  SIGNS: BP (!) 130/50 Comment: has recently started new BP meds -- typically 170 -200 over 80 or 90.  Pulse 67   Temp 98.7 F (37.1 C)   Ht 5' 7 (1.702 m)   Wt 150 lb 12.8 oz (68.4 kg)   SpO2 98%   BMI 23.62 kg/m    Physical Examination:   General Appearance: No distress  EYES PERRLA, EOM intact.   NECK Supple, No JVD Pulmonary: normal breath sounds, No wheezing.  CardiovascularNormal S1,S2.  No m/r/g.   Abdomen: Benign, Soft, non-tender. Skin:   warm, no rashes, no ecchymosis  Extremities: normal, no cyanosis, clubbing. Neuro:without focal findings,  speech normal  PSYCHIATRIC: Mood, affect within normal limits.   ASSESSMENT AND PLAN  OSA I suspect that OSA is likely present due to clinical presentation. Discussed the consequences of untreated sleep apnea. Advised not to drive drowsy for safety of patient and others. Will complete further evaluation with in lab sleep study and follow up to review results.    HTN Stable, on current management. Following with PCP.   H/O CVA Stable, following with neurology.    MEDICATION ADJUSTMENTS/LABS AND TESTS ORDERED: Recommend Sleep Study   Patient  satisfied with Plan of action and management. All questions answered  Follow up to review PSG results and treatment plan.   I spent a total of 64 minutes reviewing chart data, face-to-face evaluation with the patient, counseling and coordination of care as detailed above.    Arwyn Besaw, M.D.  Sleep Medicine [facility]           [1]  Allergies Allergen Reactions   Ciprofloxacin Shortness Of Breath   Latex Other (See Comments)    Burns skin   Vitamin C [Bioflavonoid Products] Itching   "

## 2024-05-23 NOTE — Patient Instructions (Signed)
 Will complete in lab sleep study and follow up to review results.

## 2024-05-25 ENCOUNTER — Emergency Department (HOSPITAL_COMMUNITY)
Admission: EM | Admit: 2024-05-25 | Discharge: 2024-05-26 | Disposition: A | Discharge status: Home / Self Care | Attending: Emergency Medicine | Admitting: Emergency Medicine

## 2024-05-25 ENCOUNTER — Other Ambulatory Visit: Payer: Self-pay

## 2024-05-25 ENCOUNTER — Encounter (HOSPITAL_COMMUNITY): Payer: Self-pay

## 2024-05-25 ENCOUNTER — Telehealth: Payer: Self-pay | Admitting: Physician Assistant

## 2024-05-25 ENCOUNTER — Emergency Department (HOSPITAL_COMMUNITY)

## 2024-05-25 DIAGNOSIS — Z91040 Latex allergy status: Secondary | ICD-10-CM | POA: Diagnosis not present

## 2024-05-25 DIAGNOSIS — R519 Headache, unspecified: Secondary | ICD-10-CM | POA: Insufficient documentation

## 2024-05-25 DIAGNOSIS — E871 Hypo-osmolality and hyponatremia: Secondary | ICD-10-CM | POA: Diagnosis not present

## 2024-05-25 DIAGNOSIS — H5340 Unspecified visual field defects: Secondary | ICD-10-CM | POA: Insufficient documentation

## 2024-05-25 DIAGNOSIS — E878 Other disorders of electrolyte and fluid balance, not elsewhere classified: Secondary | ICD-10-CM | POA: Diagnosis not present

## 2024-05-25 DIAGNOSIS — Z79899 Other long term (current) drug therapy: Secondary | ICD-10-CM | POA: Insufficient documentation

## 2024-05-25 DIAGNOSIS — I10 Essential (primary) hypertension: Secondary | ICD-10-CM | POA: Diagnosis not present

## 2024-05-25 DIAGNOSIS — Z7902 Long term (current) use of antithrombotics/antiplatelets: Secondary | ICD-10-CM | POA: Insufficient documentation

## 2024-05-25 DIAGNOSIS — E119 Type 2 diabetes mellitus without complications: Secondary | ICD-10-CM | POA: Insufficient documentation

## 2024-05-25 DIAGNOSIS — G8929 Other chronic pain: Secondary | ICD-10-CM | POA: Diagnosis not present

## 2024-05-25 DIAGNOSIS — Z8673 Personal history of transient ischemic attack (TIA), and cerebral infarction without residual deficits: Secondary | ICD-10-CM | POA: Insufficient documentation

## 2024-05-25 LAB — CBC WITH DIFFERENTIAL/PLATELET
Abs Immature Granulocytes: 0.01 K/uL (ref 0.00–0.07)
Basophils Absolute: 0 K/uL (ref 0.0–0.1)
Basophils Relative: 1 %
Eosinophils Absolute: 0.2 K/uL (ref 0.0–0.5)
Eosinophils Relative: 4 %
HCT: 36.6 % (ref 36.0–46.0)
Hemoglobin: 12.2 g/dL (ref 12.0–15.0)
Immature Granulocytes: 0 %
Lymphocytes Relative: 42 %
Lymphs Abs: 2.3 K/uL (ref 0.7–4.0)
MCH: 29.5 pg (ref 26.0–34.0)
MCHC: 33.3 g/dL (ref 30.0–36.0)
MCV: 88.6 fL (ref 80.0–100.0)
Monocytes Absolute: 0.4 K/uL (ref 0.1–1.0)
Monocytes Relative: 8 %
Neutro Abs: 2.5 K/uL (ref 1.7–7.7)
Neutrophils Relative %: 45 %
Platelets: 271 K/uL (ref 150–400)
RBC: 4.13 MIL/uL (ref 3.87–5.11)
RDW: 12.6 % (ref 11.5–15.5)
WBC: 5.6 K/uL (ref 4.0–10.5)
nRBC: 0 % (ref 0.0–0.2)

## 2024-05-25 LAB — COMPREHENSIVE METABOLIC PANEL WITH GFR
ALT: 16 U/L (ref 0–44)
AST: 24 U/L (ref 15–41)
Albumin: 4.3 g/dL (ref 3.5–5.0)
Alkaline Phosphatase: 64 U/L (ref 38–126)
Anion gap: 11 (ref 5–15)
BUN: 14 mg/dL (ref 8–23)
CO2: 26 mmol/L (ref 22–32)
Calcium: 9.5 mg/dL (ref 8.9–10.3)
Chloride: 93 mmol/L — ABNORMAL LOW (ref 98–111)
Creatinine, Ser: 0.91 mg/dL (ref 0.44–1.00)
GFR, Estimated: >60 mL/min
Glucose, Bld: 108 mg/dL — ABNORMAL HIGH (ref 70–99)
Potassium: 3.6 mmol/L (ref 3.5–5.1)
Sodium: 130 mmol/L — ABNORMAL LOW (ref 135–145)
Total Bilirubin: 0.6 mg/dL (ref 0.0–1.2)
Total Protein: 6.9 g/dL (ref 6.5–8.1)

## 2024-05-25 LAB — SEDIMENTATION RATE: Sed Rate: 5 mm/h (ref 0–22)

## 2024-05-25 MED ORDER — HYDRALAZINE HCL 10 MG PO TABS: 20.0000 mg | ORAL_TABLET | Freq: Four times a day (QID) | ORAL | 2 refills | Status: AC

## 2024-05-25 MED ORDER — HYDRALAZINE HCL 25 MG PO TABS: 25.0000 mg | ORAL_TABLET | Freq: Three times a day (TID) | ORAL | 2 refills | Status: DC

## 2024-05-25 MED ORDER — METOCLOPRAMIDE HCL 5 MG/ML IJ SOLN
10.0000 mg | Freq: Once | INTRAMUSCULAR | Status: AC
  Administered 2024-05-25: 10 mg via INTRAVENOUS
  Filled 2024-05-25: qty 2

## 2024-05-25 MED ORDER — SODIUM CHLORIDE 0.9 % IV BOLUS
1000.0000 mL | Freq: Once | INTRAVENOUS | Status: AC
  Administered 2024-05-25: 1000 mL via INTRAVENOUS

## 2024-05-25 MED ORDER — ACETAMINOPHEN 500 MG PO TABS
1000.0000 mg | ORAL_TABLET | Freq: Once | ORAL | Status: DC
  Filled 2024-05-25: qty 2

## 2024-05-25 NOTE — ED Notes (Addendum)
 PT is ambulatory and took vitals.SABRASABRA

## 2024-05-25 NOTE — Discharge Instructions (Signed)
 You were seen for your high blood pressure (hypertension) in the emergency department.   At home, please take the medications that you were prescribed for your blood pressure.  STOP the hydrochlorothiazide  because your sodium is getting low. START taking 20 mg hydralazine  4 times daily.   Check your MyChart online for the results of any tests that had not resulted by the time you left the emergency department.   Follow-up with your primary doctor in 1 week regarding your visit.    Return immediately to the emergency department if you experience any of the following: Severe headache, vision changes, numbness or weakness of your arms or legs, difficulty breathing, chest pain, or any other concerning symptoms.    Thank you for visiting our Emergency Department. It was a pleasure taking care of you today.

## 2024-05-25 NOTE — ED Provider Triage Note (Signed)
 Emergency Medicine Provider Triage Evaluation Note  [name] , a 69 y.o. female  was evaluated in triage.  Pt complains of severe frontal headache, history of similar however states that this one is one of the worst. No acute visual disturbances or stroke like symptoms including facial droop or slurred speech. No issues with balance or coordination. Denies chest pain or shortness of breath. Compliant with BP meds and has taken clonidine  today.   Review of Systems  Positive: HTN, headache Negative: Stroke-like symptoms, chest pain, shortness of breath  Physical Exam  BP (!) 172/75 (BP Location: Right Arm)   Pulse 82   Temp 98.3 F (36.8 C)   Resp 18   SpO2 96%  Gen:   Awake, no distress   Resp:  Normal effort MSK:   Moves extremities without difficulty  Other:    Medical Decision Making  Medically screening exam initiated at 2:49 PM.  Appropriate orders placed.  [name] was informed that the remainder of the evaluation will be completed by another provider, this initial triage assessment does not replace that evaluation, and the importance of remaining in the ED until their evaluation is complete.  Orders: CBC, CMP, Tylenol , CT head   Janetta Terrall FALCON, PA-C 05/25/24 [id number]

## 2024-05-25 NOTE — ED Triage Notes (Signed)
 Pt report she is here due to high blood pressure and a headache she has had for months, has seen neurologist about

## 2024-05-25 NOTE — Telephone Encounter (Signed)
 Pt called and LM with AN. She keeps getting pain across her forehead. Her provider hasn't given her a definitive answer on what's wrong. She is having lightheadedness and nausea. She has had a stroke in the past, her vision In her left eye is dimmer. Her BP has been elevated- current BP 212-90 pulse is 78

## 2024-05-25 NOTE — ED Provider Notes (Signed)
 " [place] EMERGENCY DEPARTMENT AT [facility] Provider Note   CSN: [id number] Arrival date & time: 05/25/24  1353     Patient presents with: Hypertension and Headache   [name] is a 69 y.o. female who presents to the emergency department with a chief complaint of headache that is worse over the front of her head as well as in her left temporal area.  Patient denies acute visual disturbances however states that her left peripheral vision worsened after her most recent stroke.  Patient has previously seen neurology for headaches and also follows up with her primary care provider as well as cardiology.  States that she feels as though her blood pressure and headaches are related.  She feels as though when her blood pressure is high her headaches become more frequent. States that she has had a chronic deficit of her left upper visual field following her previous stroke. Past medical history of epigastric abdominal pain, constipation, GERD, hypertension, hyperlipidemia, carotid artery stenosis, AAA without rupture, type 2 diabetes, migraine with aura, CVA, nontraumatic intracranial hemorrhage, etc.  Per chart review appears patient reached out to neurology office and was instructed to come to the emergency department due to frontal headache and possibility of left eye vision changes.    Hypertension Associated symptoms include headaches.  Headache      Prior to Admission medications  Medication Sig Start Date End Date Taking? Authorizing Provider  acetaminophen  (TYLENOL ) 500 MG tablet Take 500 mg by mouth every 6 (six) hours as needed for moderate pain or headache.    [provider]  ALPRAZolam  (XANAX ) 0.5 MG tablet TAKE 1 TABLET BY MOUTH 2 TIMES DAILY AS NEEDED FOR ANXIETY. Patient not taking: Reported on 05/23/2024 01/31/24   Duanne Butler DASEN, MD  amLODipine  (NORVASC ) 10 MG tablet TAKE 1 TABLET(10 MG) BY MOUTH AT BEDTIME 10/10/23   Duanne Butler DASEN, MD   busPIRone (BUSPAR) 5 MG tablet Take 5 mg by mouth daily. 04/25/24   [provider]  carvedilol  (COREG ) 3.125 MG tablet Take 2 tablets (6.25 mg total) by mouth 2 (two) times daily with a meal. 09/15/23   Duanne Butler DASEN, MD  cetirizine (ZYRTEC) 10 MG tablet Take 10 mg by mouth daily as needed.    [provider]  Cholecalciferol (VITAMIN D) 50 MCG (2000 UT) CAPS Take 2,000 Units by mouth daily.    [provider]  cloNIDine  (CATAPRES ) 0.1 MG tablet Take 1 tablet (0.1 mg total) by mouth 3 (three) times daily as needed (180/100). 08/02/23   Duanne Butler DASEN, MD  clopidogrel  (PLAVIX ) 75 MG tablet Take 1 tablet (75 mg total) by mouth daily. 07/28/23   Lehner, Erin C, NP  Coenzyme Q10 (COQ10) 100 MG CAPS Take 100 mg by mouth daily.    [provider]  CRANBERRY PO Take 1 each by mouth 2 (two) times a week. Chewables    [provider]  Cyanocobalamin  (B-12) 2500 MCG TABS Take 2,500 mcg by mouth daily.    [provider]  EPINEPHrine  0.3 mg/0.3 mL IJ SOAJ injection Inject 0.3 mg into the muscle as needed for anaphylaxis. 03/12/22   Duanne Butler DASEN, MD  fluticasone  (FLONASE ) 50 MCG/ACT nasal spray Place 2 sprays into both nostrils daily as needed for allergies. 01/23/23   [provider]  hydrALAZINE  (APRESOLINE ) 25 MG tablet Take 1 tablet (25 mg total) by mouth 3 (three) times daily. 05/25/24 08/23/24  Yolande Lamar BROCKS, MD  hydrochlorothiazide  (HYDRODIURIL ) 25  MG tablet Take 1 tablet (25 mg total) by mouth daily. Patient not taking: Reported on 05/23/2024 08/19/23   Duanne Butler DASEN, MD  hydroquinone 4 % cream Apply 1 application topically 2 (two) times daily as needed (dark spots). 01/05/21   [provider]  ipratropium (ATROVENT ) 0.03 % nasal spray INSTILL 2 SPRAYS IN EACH NOSTRIL EVERY 12 HOURS Patient taking differently: Place 2 sprays into both nostrils 2 (two) times daily as needed for rhinitis. 04/17/21   Duanne Butler DASEN, MD   ipratropium (ATROVENT ) 0.06 % nasal spray Place 1 spray into both nostrils 3 (three) times daily. 01/15/24   [provider]  linaclotide  (LINZESS ) 72 MCG capsule Take 1 capsule (72 mcg total) by mouth daily before breakfast. 30 minutes before breakfast 01/19/24   Shirlean Therisa ORN, NP  loratadine  (CLARITIN ) 10 MG tablet Take 10 mg by mouth daily as needed for allergies.    [provider]  MAGNESIUM  PO Take 330 mg by mouth daily.    [provider]  ondansetron  (ZOFRAN ) 4 MG tablet Take 1 tablet (4 mg total) by mouth every 8 (eight) hours as needed for nausea or vomiting. 10/13/23   Shirlean Therisa ORN, NP  pantoprazole  (PROTONIX ) 40 MG tablet Take 1 tablet (40 mg total) by mouth 2 (two) times daily before a meal. 01/19/24   Shirlean Therisa ORN, NP  rosuvastatin  (CRESTOR ) 40 MG tablet Take 40 mg by mouth daily.    [provider]  sodium chloride  (OCEAN) 0.65 % SOLN nasal spray Place 1 spray into both nostrils as needed for congestion.    [provider]  sucralfate  (CARAFATE ) 1 g tablet Take 1 tablet (1 g total) by mouth 4 (four) times daily -  with meals and at bedtime. 04/26/24   Towana Ozell BROCKS, MD  triamcinolone  cream (KENALOG ) 0.1 % Apply 1 Application topically 2 (two) times daily as needed (irritation). 09/21/23   Duanne Butler DASEN, MD  valsartan  (DIOVAN ) 320 MG tablet Take 1 tablet (320 mg total) by mouth daily. 09/15/23   Duanne Butler DASEN, MD  vitamin E 400 UNIT capsule Take 400 Units by mouth 3 (three) times a week.    [provider]  zinc gluconate 50 MG tablet Take 50 mg by mouth 3 (three) times a week.    [provider]    Allergies: Ciprofloxacin, Latex, and Vitamin c [bioflavonoid products]    Review of Systems  Neurological:  Positive for headaches.    Updated Vital Signs BP (!) 135/59 (BP Location: Right Arm)   Pulse (!) 58   Temp 98.4 F (36.9 C) (Oral)   Resp 18   SpO2 100%   Physical Exam Vitals and nursing note  reviewed.  Constitutional:      General: She is awake. She is not in acute distress.    Appearance: Normal appearance. She is well-developed. She is not ill-appearing, toxic-appearing or diaphoretic.  HENT:     Head: Normocephalic and atraumatic.     Comments: Left temple is tender to palpation, good temporal pulse felt  Eyes:     General: Visual field deficit present. No scleral icterus.    Extraocular Movements: Extraocular movements intact.     Right eye: Normal extraocular motion and no nystagmus.     Left eye: Normal extraocular motion and no nystagmus.     Pupils: Pupils are equal, round, and reactive to light. Pupils are equal.     Right eye: Pupil is round and reactive.  Left eye: Pupil is round and reactive.     Comments: Left upper visual field deficit (chronic)  Cardiovascular:     Rate and Rhythm: Normal rate and regular rhythm.  Pulmonary:     Effort: Pulmonary effort is normal. No respiratory distress.     Breath sounds: Normal breath sounds. No stridor. No wheezing, rhonchi or rales.  Musculoskeletal:        General: Normal range of motion.     Cervical back: Normal range of motion.     Comments: Equal grip strength bilaterally, patient able to raise upper and lower extremities off bed against resistance as well as gravity  Skin:    General: Skin is warm.     Capillary Refill: Capillary refill takes less than 2 seconds.     Comments: No obvious rashes or lesions  Neurological:     Mental Status: She is alert and oriented to person, place, and time.     GCS: GCS eye subscore is 4. GCS verbal subscore is 5. GCS motor subscore is 6.     Cranial Nerves: No cranial nerve deficit, dysarthria or facial asymmetry.     Sensory: Sensation is intact.     Motor: Motor function is intact. No weakness.     Coordination: Coordination is intact. Coordination normal.     Gait: Gait is intact.     Comments: No facial droop, slurred speech, coordination intact with finger-nose  testing  Psychiatric:        Mood and Affect: Mood normal.        Behavior: Behavior normal. Behavior is cooperative.     (all labs ordered are listed, but only abnormal results are displayed) Labs Reviewed  COMPREHENSIVE METABOLIC PANEL WITH GFR - Abnormal; Notable for the following components:      Result Value   Sodium 130 (*)    Chloride 93 (*)    Glucose, Bld 108 (*)    All other components within normal limits  CBC WITH DIFFERENTIAL/PLATELET  SEDIMENTATION RATE    EKG: None  Radiology: CT Head Wo Contrast Result Date: 05/25/2024 EXAM: CT HEAD WITHOUT CONTRAST 05/25/2024 03:27:02 PM TECHNIQUE: CT of the head was performed without the administration of intravenous contrast. Automated exposure control, iterative reconstruction, and/or weight based adjustment of the mA/kV was utilized to reduce the radiation dose to as low as reasonably achievable. COMPARISON: 12/05/2023 CLINICAL HISTORY: Severe frontal headache. FINDINGS: BRAIN AND VENTRICLES: No acute hemorrhage. Remote medial right occipital lobe infarct is again noted. Subcortical white matter hypoattenuation, similar to the prior study. No evidence of acute infarct. No hydrocephalus. No extra-axial collection. No mass effect or midline shift. ORBITS: Bilateral lens replacement. SINUSES: No acute abnormality. SOFT TISSUES AND SKULL: No acute soft tissue abnormality. No skull fracture. VASCULATURE: Atherosclerotic calcifications within the cavernous internal carotid arteries. IMPRESSION: 1. No acute intracranial abnormality. 2. Remote medial right occipital lobe infarct and mild white matter disease , unchanged from prior study. Electronically signed by: Lonni Necessary MD 05/25/2024 03:48 PM EST RP Workstation: [id number]     Procedures   Medications Ordered in the ED  acetaminophen  (TYLENOL ) tablet 1,000 mg (has no administration in time range)  sodium chloride  0.9 % bolus 1,000 mL (0 mLs Intravenous Stopped 05/25/24 2206)   metoCLOPramide  (REGLAN ) injection 10 mg (10 mg Intravenous Given 05/25/24 2035)  Medical Decision Making Amount and/or Complexity of Data Reviewed Labs: ordered. Radiology: ordered.  Risk OTC drugs. Prescription drug management.   Patient presents to the ED for concern of headache, hypertension, this involves an extensive number of treatment options, and is a complaint that carries with it a high risk of complications and morbidity.  The differential diagnosis includes headache, migraine, dehydration, electrolyte abnormality, intracranial hemorrhage, temporal arteritis, stroke, etc.   Co morbidities that complicate the patient evaluation  epigastric abdominal pain, constipation, GERD, hypertension, hyperlipidemia, carotid artery stenosis, AAA without rupture, type 2 diabetes, migraine with aura, CVA, nontraumatic intracranial hemorrhage   Additional history obtained:  Additional history obtained from telephone visit from today where patient spoke with neurology PA who advised going to the emergency department for further workup, at this time patient high concern for pain across her forehead as well as lightheadedness and nausea, also concern of left eye vision dimming, blood pressure at this time was 212/90 with a pulse of 78   Lab Tests:  I Ordered, and personally interpreted labs.  The pertinent results include: CBC unremarkable, CMP significant for sodium of 130, chloride of 93, glucose of 108, ESR pending   Imaging Studies ordered:  I ordered imaging studies including CT head I independently visualized and interpreted imaging which showed no acute intracranial abnormality I agree with the radiologist interpretation   Medicines ordered and prescription drug management:  I ordered medication including Reglan , fluids, Tylenol  for headache Reevaluation of the patient after these medicines showed that the patient improved I have reviewed  the patients home medicines and have made adjustments as needed   Test Considered:  MRI: Deferred at this time as patient has no focal neurologic deficit that appears acute, patient does have chronic left upper visual field deficit following previous stroke, no facial droop, slurred speech, or extremity strength deficit noted, no issues with balance or coordination   Critical Interventions:  None   Problem List / ED Course:  69 year old female, vital signs stable initially hypertensive, presents emergency department for chief complaint of headache, per chart review patient was advised to come the emergency department for frontal type headache as well as possible left eye dimming On physical exam there is no acute focal deficit, no acute strokelike symptoms identified by myself in triage, will place order for baseline labs to rule out the possibility of hypertensive emergency, will also obtain CT imaging of head as patient states that this headache is very severe, patient does have left-sided upper visual field deficit however states that this is chronic following her previous stroke Labs reassuring, show mild hyponatremia as well as hypochloremia, CT head reassuring When patient was roomed she states that she still has a headache, will attempt a migraine cocktail and fluids which should also help with electrolyte disturbance, with further questioning patient does have tenderness with palpation of left temporal area, good left temporal pulses felt, patient does appreciate some intermittent jaw claudication as well as myalgias, low clinical suspicion for temporal arteritis however will order ESR to assess Patient improved following migraine cocktail Patient signed out to Dr. Jakie at time of shift change pending ESR, if not elevated, plan is for discharge and follow-up with primary care provider as well as neurology.  The patient's blood pressure medication has been changed to 3 times daily  by attending provider for better blood pressure control, instructed patient to make primary care provider as well as specialist aware of this new medication dose, patient understanding Unknown diagnosis for  headaches at this time, ESR pending for possible temporal arteritis, no sign of intracranial abnormality on CT scan, no acute focal neurodeficit to suggest stroke, patient vitals have remained stable throughout ED course   Reevaluation:  After the interventions noted above, I reevaluated the patient and found that they have :improved   Social Determinants of Health:  None   Dispostion:  Patient signed out to attending Dr. Jakie who assumes care over this patient, further workup, and ultimately disposition, at this time pending ESR to rule out possible temporal arteritis     Final diagnoses:  Chronic nonintractable headache, unspecified headache type    ED Discharge Orders          Ordered    hydrALAZINE  (APRESOLINE ) 25 MG tablet  3 times daily        05/25/24 2003               [name], [place] 05/25/24 2230    Yolande Lamar BROCKS, MD 05/25/24 2349  "

## 2024-05-25 NOTE — ED Provider Notes (Signed)
" °  Physical Exam  BP (!) 135/59 (BP Location: Right Arm)   Pulse (!) 58   Temp 98.4 F (36.9 C) (Oral)   Resp 18   SpO2 100%   Physical Exam  Procedures  Procedures  ED Course / MDM    Medical Decision Making Amount and/or Complexity of Data Reviewed Labs: ordered. Radiology: ordered.  Risk OTC drugs. Prescription drug management.   Patient reassessed after the end of shift for the PA.  ESR WNL making temporal arteritis highly unlikely.  Patient's labs were reviewed and she is hyponatremic.  She is on hydrochlorothiazide .  I suspect this may be the cause of her hyponatremia.  This is somewhat difficult because of her resistant hypertension but I think the hydrochlorothiazide  needs to be stopped at this point in time we will increase her hydralazine .  Says that she still has as needed clonidine  to take should her blood pressure get too high.  Will have her follow-up with her primary doctor within the next week to discuss what her blood pressure has been.       Yolande Lamar BROCKS, MD 05/25/24 2349  "

## 2024-05-26 NOTE — ED Notes (Signed)
 Patient given discharge, follow-up, and medication instructions. Patient verbalized understanding. Patient left via private vehicle.

## 2024-06-11 ENCOUNTER — Other Ambulatory Visit: Payer: Self-pay | Admitting: Vascular Surgery

## 2024-06-13 NOTE — Addendum Note (Signed)
 Addended by: VICCI EVALENE DEL on: 06/13/2024 02:34 PM   Modules accepted: Orders

## 2024-06-14 ENCOUNTER — Other Ambulatory Visit: Payer: Self-pay | Admitting: Family Medicine

## 2024-06-15 NOTE — Telephone Encounter (Signed)
 Requested Prescriptions  Refused Prescriptions Disp Refills   carvedilol  (COREG ) 3.125 MG tablet [Pharmacy Med Name: CARVEDILOL  3.125MG  TABLETS] 360 tablet 3    Sig: TAKE 2 TABLETS(6.25 MG) BY MOUTH TWICE DAILY WITH A MEAL     Cardiovascular: Beta Blockers 3 Failed - 06/15/2024  3:52 PM      Failed - Valid encounter within last 6 months    Recent Outpatient Visits           6 months ago Benign essential HTN   [place] [facility] Duanne Butler DASEN, MD   9 months ago Benign essential HTN   [place] [facility] Pickard, Butler DASEN, MD   10 months ago Benign essential HTN   [place] [facility] Duanne, Butler DASEN, MD   10 months ago Benign essential HTN    [facility] Duanne, Butler DASEN, MD   10 months ago Benign essential HTN    [facility] Pickard, Butler DASEN, MD              Passed - Cr in normal range and within 360 days    Creat  Date Value Ref Range Status  11/25/2023 0.93 0.50 - 1.05 mg/dL Final   Creatinine, Ser  Date Value Ref Range Status  05/25/2024 0.91 0.44 - 1.00 mg/dL Final   Creatinine, Urine  Date Value Ref Range Status  12/22/2022 114 20 - 275 mg/dL Final         Passed - AST in normal range and within 360 days    AST  Date Value Ref Range Status  05/25/2024 24 15 - 41 U/L Final    Comment:    HEMOLYSIS AT THIS LEVEL MAY AFFECT RESULT  11/16/2012 18 U/L Final         Passed - ALT in normal range and within 360 days    ALT  Date Value Ref Range Status  05/25/2024 16 0 - 44 U/L Final         Passed - Last BP in normal range    BP Readings from Last 1 Encounters:  05/26/24 (!) 123/57         Passed - Last Heart Rate in normal range    Pulse Readings from Last 1 Encounters:  05/26/24 63

## 2024-07-04 ENCOUNTER — Ambulatory Visit: Admitting: Vascular Surgery

## 2024-07-04 ENCOUNTER — Encounter (HOSPITAL_COMMUNITY)

## 2024-07-12 ENCOUNTER — Ambulatory Visit: Admitting: Cardiovascular Disease

## 2024-08-30 ENCOUNTER — Ambulatory Visit: Admitting: Physician Assistant
